# Patient Record
Sex: Male | Born: 1950 | Race: Black or African American | Hispanic: No | Smoking: Former smoker
Health system: Southern US, Community
[De-identification: ages and names within clinical notes are randomized; demographics above are authoritative.]

## PROBLEM LIST (undated history)

## (undated) DIAGNOSIS — I2510 Atherosclerotic heart disease of native coronary artery without angina pectoris: Secondary | ICD-10-CM

## (undated) DIAGNOSIS — F1020 Alcohol dependence, uncomplicated: Secondary | ICD-10-CM

## (undated) DIAGNOSIS — J45909 Unspecified asthma, uncomplicated: Secondary | ICD-10-CM

## (undated) DIAGNOSIS — J42 Unspecified chronic bronchitis: Secondary | ICD-10-CM

## (undated) DIAGNOSIS — J189 Pneumonia, unspecified organism: Secondary | ICD-10-CM

## (undated) DIAGNOSIS — K7460 Unspecified cirrhosis of liver: Secondary | ICD-10-CM

## (undated) DIAGNOSIS — I10 Essential (primary) hypertension: Secondary | ICD-10-CM

## (undated) DIAGNOSIS — I5032 Chronic diastolic (congestive) heart failure: Secondary | ICD-10-CM

## (undated) DIAGNOSIS — E7800 Pure hypercholesterolemia, unspecified: Secondary | ICD-10-CM

## (undated) DIAGNOSIS — B1920 Unspecified viral hepatitis C without hepatic coma: Secondary | ICD-10-CM

## (undated) DIAGNOSIS — IMO0001 Reserved for inherently not codable concepts without codable children: Secondary | ICD-10-CM

## (undated) DIAGNOSIS — E119 Type 2 diabetes mellitus without complications: Secondary | ICD-10-CM

## (undated) DIAGNOSIS — M1990 Unspecified osteoarthritis, unspecified site: Secondary | ICD-10-CM

## (undated) DIAGNOSIS — J449 Chronic obstructive pulmonary disease, unspecified: Secondary | ICD-10-CM

## (undated) HISTORY — DX: Unspecified cirrhosis of liver: K74.60

## (undated) HISTORY — DX: Alcohol dependence, uncomplicated: F10.20

---

## 1998-10-10 ENCOUNTER — Encounter: Admission: RE | Admit: 1998-10-10 | Discharge: 1999-01-08 | Payer: Self-pay | Admitting: Internal Medicine

## 2001-10-09 ENCOUNTER — Emergency Department (HOSPITAL_COMMUNITY): Admission: EM | Admit: 2001-10-09 | Discharge: 2001-10-09 | Payer: Self-pay | Admitting: Emergency Medicine

## 2002-01-23 ENCOUNTER — Ambulatory Visit (HOSPITAL_COMMUNITY): Admission: RE | Admit: 2002-01-23 | Discharge: 2002-01-23 | Payer: Self-pay | Admitting: *Deleted

## 2002-01-23 ENCOUNTER — Encounter: Payer: Self-pay | Admitting: *Deleted

## 2002-01-23 ENCOUNTER — Encounter (INDEPENDENT_AMBULATORY_CARE_PROVIDER_SITE_OTHER): Payer: Self-pay | Admitting: Specialist

## 2004-08-25 ENCOUNTER — Emergency Department (HOSPITAL_COMMUNITY): Admission: EM | Admit: 2004-08-25 | Discharge: 2004-08-25 | Payer: Self-pay | Admitting: *Deleted

## 2004-09-14 ENCOUNTER — Ambulatory Visit (HOSPITAL_COMMUNITY): Admission: RE | Admit: 2004-09-14 | Discharge: 2004-09-14 | Payer: Self-pay | Admitting: Gastroenterology

## 2004-09-26 ENCOUNTER — Ambulatory Visit (HOSPITAL_COMMUNITY): Admission: RE | Admit: 2004-09-26 | Discharge: 2004-09-26 | Payer: Self-pay | Admitting: Gastroenterology

## 2007-05-09 ENCOUNTER — Ambulatory Visit: Payer: Self-pay | Admitting: Gastroenterology

## 2007-10-31 ENCOUNTER — Ambulatory Visit (HOSPITAL_COMMUNITY): Admission: RE | Admit: 2007-10-31 | Discharge: 2007-10-31 | Payer: Self-pay | Admitting: Gastroenterology

## 2007-12-25 ENCOUNTER — Ambulatory Visit: Payer: Self-pay | Admitting: Gastroenterology

## 2009-01-13 ENCOUNTER — Observation Stay (HOSPITAL_COMMUNITY): Admission: EM | Admit: 2009-01-13 | Discharge: 2009-01-14 | Payer: Self-pay | Admitting: Emergency Medicine

## 2009-01-19 ENCOUNTER — Emergency Department (HOSPITAL_COMMUNITY): Admission: EM | Admit: 2009-01-19 | Discharge: 2009-01-19 | Payer: Self-pay | Admitting: Emergency Medicine

## 2009-07-07 ENCOUNTER — Ambulatory Visit: Payer: Self-pay | Admitting: Gastroenterology

## 2009-10-19 ENCOUNTER — Ambulatory Visit (HOSPITAL_COMMUNITY)
Admission: RE | Admit: 2009-10-19 | Discharge: 2009-10-19 | Payer: Self-pay | Admitting: Physical Medicine and Rehabilitation

## 2009-10-22 HISTORY — PX: TOTAL KNEE ARTHROPLASTY: SHX125

## 2010-03-26 ENCOUNTER — Encounter: Payer: Self-pay | Admitting: Gastroenterology

## 2010-05-09 ENCOUNTER — Ambulatory Visit
Admission: RE | Admit: 2010-05-09 | Discharge: 2010-05-09 | Disposition: A | Discharge status: Home / Self Care | Payer: BC Managed Care – PPO | Source: Ambulatory Visit | Attending: Internal Medicine | Admitting: Internal Medicine

## 2010-05-09 ENCOUNTER — Other Ambulatory Visit: Payer: Self-pay | Admitting: Internal Medicine

## 2010-05-09 DIAGNOSIS — R1012 Left upper quadrant pain: Secondary | ICD-10-CM

## 2010-05-09 MED ORDER — IOHEXOL 300 MG/ML  SOLN
100.0000 mL | Freq: Once | INTRAMUSCULAR | Status: AC | PRN
  Administered 2010-05-09: 100 mL via INTRAVENOUS

## 2010-05-15 ENCOUNTER — Other Ambulatory Visit: Payer: Self-pay | Admitting: Internal Medicine

## 2010-05-15 DIAGNOSIS — R1011 Right upper quadrant pain: Secondary | ICD-10-CM

## 2010-05-18 ENCOUNTER — Ambulatory Visit
Admission: RE | Admit: 2010-05-18 | Discharge: 2010-05-18 | Disposition: A | Discharge status: Home / Self Care | Payer: BC Managed Care – PPO | Source: Ambulatory Visit | Attending: Internal Medicine | Admitting: Internal Medicine

## 2010-05-18 DIAGNOSIS — R1011 Right upper quadrant pain: Secondary | ICD-10-CM

## 2010-05-19 LAB — CBC
MCHC: 33.7 g/dL (ref 30.0–36.0)
Platelets: 230 10*3/uL (ref 150–400)
RDW: 13.5 % (ref 11.5–15.5)

## 2010-05-19 LAB — GLUCOSE, CAPILLARY: Glucose-Capillary: 120 mg/dL — ABNORMAL HIGH (ref 70–99)

## 2010-05-19 LAB — PROTIME-INR
INR: 0.99 (ref 0.00–1.49)
Prothrombin Time: 13.3 seconds (ref 11.6–15.2)

## 2010-06-07 LAB — CBC
HCT: 39.9 % (ref 39.0–52.0)
Hemoglobin: 13.7 g/dL (ref 13.0–17.0)
MCHC: 34.3 g/dL (ref 30.0–36.0)
MCV: 88.8 fL (ref 78.0–100.0)
MCV: 89.5 fL (ref 78.0–100.0)
Platelets: 220 10*3/uL (ref 150–400)
RBC: 4.46 MIL/uL (ref 4.22–5.81)
RDW: 13.3 % (ref 11.5–15.5)
WBC: 5.3 10*3/uL (ref 4.0–10.5)

## 2010-06-07 LAB — URINE MICROSCOPIC-ADD ON

## 2010-06-07 LAB — POCT I-STAT, CHEM 8
BUN: 15 mg/dL (ref 6–23)
Creatinine, Ser: 0.8 mg/dL (ref 0.4–1.5)
Hemoglobin: 15.3 g/dL (ref 13.0–17.0)
Potassium: 3.9 mEq/L (ref 3.5–5.1)
Sodium: 138 mEq/L (ref 135–145)

## 2010-06-07 LAB — CARDIAC PANEL(CRET KIN+CKTOT+MB+TROPI)
Total CK: 154 U/L (ref 7–232)
Total CK: 196 U/L (ref 7–232)

## 2010-06-07 LAB — POCT CARDIAC MARKERS
CKMB, poc: 1.9 ng/mL (ref 1.0–8.0)
Myoglobin, poc: 103 ng/mL (ref 12–200)

## 2010-06-07 LAB — COMPREHENSIVE METABOLIC PANEL
AST: 61 U/L — ABNORMAL HIGH (ref 0–37)
AST: 57 U/L — ABNORMAL HIGH (ref 0–37)
Albumin: 3.6 g/dL (ref 3.5–5.2)
CO2: 25 mEq/L (ref 19–32)
Calcium: 9.4 mg/dL (ref 8.4–10.5)
Calcium: 8.5 mg/dL (ref 8.4–10.5)
Creatinine, Ser: 0.81 mg/dL (ref 0.4–1.5)
Creatinine, Ser: 0.75 mg/dL (ref 0.4–1.5)
GFR calc Af Amer: >60 mL/min (ref >60)
GFR calc Af Amer: >60 mL/min (ref >60)
GFR calc non Af Amer: >60 mL/min (ref >60)
GFR calc non Af Amer: >60 mL/min (ref >60)
Total Protein: 6.4 g/dL (ref 6.0–8.3)

## 2010-06-07 LAB — CK TOTAL AND CKMB (NOT AT ARMC)
CK, MB: 2.7 ng/mL (ref 0.3–4.0)
Total CK: 236 U/L — ABNORMAL HIGH (ref 7–232)

## 2010-06-07 LAB — DIFFERENTIAL
Eosinophils Absolute: 0.1 10*3/uL (ref 0.0–0.7)
Eosinophils Relative: 1 % (ref 0–5)
Eosinophils Relative: 2 % (ref 0–5)
Lymphocytes Relative: 31 % (ref 12–46)
Lymphs Abs: 1.6 10*3/uL (ref 0.7–4.0)
Lymphs Abs: 3.4 10*3/uL (ref 0.7–4.0)
Monocytes Relative: 10 % (ref 3–12)

## 2010-06-07 LAB — TSH: TSH: 1.708 u[IU]/mL (ref 0.350–4.500)

## 2010-06-07 LAB — URINALYSIS, ROUTINE W REFLEX MICROSCOPIC
Glucose, UA: NEGATIVE mg/dL
Ketones, ur: 15 mg/dL — AB
pH: 5.5 (ref 5.0–8.0)

## 2010-06-07 LAB — GLUCOSE, CAPILLARY
Glucose-Capillary: 148 mg/dL — ABNORMAL HIGH (ref 70–99)
Glucose-Capillary: 82 mg/dL (ref 70–99)
Glucose-Capillary: 87 mg/dL (ref 70–99)

## 2010-06-07 LAB — LIPID PANEL
LDL Cholesterol: 144 mg/dL — ABNORMAL HIGH (ref 0–99)
Total CHOL/HDL Ratio: 4.7 RATIO
VLDL: 24 mg/dL (ref 0–40)

## 2010-06-07 LAB — TROPONIN I: Troponin I: 0.02 ng/mL (ref 0.00–0.06)

## 2010-06-07 LAB — D-DIMER, QUANTITATIVE: D-Dimer, Quant: 0.24 ug/mL-FEU (ref 0.00–0.48)

## 2010-08-01 ENCOUNTER — Encounter: Payer: Self-pay | Admitting: Gastroenterology

## 2010-08-31 ENCOUNTER — Ambulatory Visit (INDEPENDENT_AMBULATORY_CARE_PROVIDER_SITE_OTHER): Payer: BC Managed Care – PPO | Admitting: Gastroenterology

## 2010-08-31 VITALS — BP 129/77 | HR 84 | Temp 97.7°F | Ht 64.0 in | Wt 167.0 lb

## 2010-08-31 DIAGNOSIS — B182 Chronic viral hepatitis C: Secondary | ICD-10-CM

## 2010-09-07 NOTE — Progress Notes (Signed)
NAME:  [name], [name]  MR#:  [id number]      DATE:  08/31/2010  DOB:  03/10/1950    cc: Consulting Physician:  Kyle Littler, MD, [facility], [address], [place], [place] [address], Fa x [phone] Kyle Listen, MD, [facility], [facility], [address] [place], [place] [address], Fax [phone] Referring Physician:  Trayon Bellow, MD, [facility], [address] [place], [place] [address], Fax [phone] Kyle Savoy, MD, [facility], [facility], [address] [place], [place], [place] [address], Fax [phone]    REASON FOR VISIT:  Follow up of genotype 1 hepatitis C.    History:  The patient returns today unaccompanied. He was previously seen by me on 07/07/2009, for his genotype 1a hepatitis C with a biopsy in 2003 showing grade 2 stage II-III disease. At that time, he was supposed to  undergo a liver biopsy, and then return in 3 months' time for follow up. He underwent a liver biopsy on 10/19/2009, but has been lost to followup since. This showed roughly speaking grade 2 stage II-III  fibrosis. He currently has no symptoms to suggest cryoglobulin mediated or decompensated liver disease. He currently has no symptoms directly referable to his hepatitis C nor other symptoms to suggest  decompensated liver disease. In terms of cryoglobulin mediated symptoms the patient reports that he has a left knee arthritis for which he may need to undergo surgery. He recalls that Dr. Corliss Owens,  who he had seen for this, told him that his arthritis/algia may be related to his hepatitis C, though I have not received any records from Dr. Corliss Owens regarding him to know on what basis this was concluded.  The patient reports he is unsure as to when there will be surgery because he is currently unable to see Dr. Althea Owens, his  orthopedic surgeon, because of an unpaid bill. Furthermore, he believed Dr. Corliss Owens that his hepatitis C was the priority over his knee.  It should be recalled that the patient has previously had history of anxiety disorder. He reports that he has not had any anxiety attacks  in over 6 months, and he has not had any ongoing counseling, follow up, though he is maintained on an SSRI.   CURRENT MEDICATIONS:  Metformin 500 mg p.o. b.i.d., glipizide 5 mg p.o. daily, Actos 30 mg p.o. daily, atorvastatin 10 mg p.o. daily, bisoprolol/hydrochlorothiazide 5/25 mg p.o. daily, cilostazol 100 mg  p.o. b.i.d., Vicodin 10 mg p.o. p.r.n. for pain, sertraline 150 mg p.o. daily, fish oil 100 mg p.o. t.i.d., aspirin 81 mg p.o. daily, vitamin B 100 B complex three times a week, Viagra 100 mg p.o. p.r.n..  He was previously on a tapered dose of prednisone for his left knee joint swelling and pain, which is now discontinued.   allergies:  Denies.   habits:  Smoking, denies.  Alcohol denies interval consumption.   REVIEW OF SYSTEMS:  All 10 systems reviewed today with the patient and is significant for left knee pain because of the arthralgias or because of arthritis. His CES-D was 23.   PHYSICAL EXAMINATION:  Constitutional:  Well-appearing without significant bitemporal wasting. Vital signs: Height 64 inches, weight 167 pounds, blood pressure 129/77, pulse of 84, temperature 97.7 Fahrenheit.   LABORATORY:  Labs from 05/09/2010; his AST was 56, ALT 71, ALP 59, total bilirubin 0.6, albumin 4.6, globulins 2.8.   On 05/10/2010; his AST was 60,  ALT 76, ALP 51, total bilirubin 0.7, which the direct was 0.2, albumin 4.6. Triglycerides were 91.  CT scan of the abdomen on 05/09/2010 with and without contrast showed an unremarkable liver and spleen. The pancreas did not have any focal lesions. There was stranding in the region of the pancreatic neck  suggesting some component of pancreatitis, although there was no  pseudocyst or abscess noticed.  On 05/12/2010; genotype was 1a and his viral load was 2,770,000 international units per mL.   Assessment:  The patient is a 60 year old gentleman with history of genotype 1a hepatitis C with a biopsy in 2003 showing grade 2 stage II-III disease and a more recent biopsy on 10/19/2009, showing effectively grade 2  stage II-III disease, as well. His synthetic function is intact.  In terms of whether his hepatitis C is causing his left knee arthritis as he says he was told, I have no records regarding this from the orthopedic surgeon or rheumatologist. I find it very  unusual for hepatitis C to cause unilateral large joint pathology. Furthermore, surgery would not be the treatment for cryoglobulin mediated joint disease. This leads me to conclude that the hepatitis C is not responsible for his left knee arthritis/algia. If he is to undergo surgery for this, this should take priority over treatment of hepatitis C. Treatment for hepatitis C would be up to a 12 month course complicated by significant anemia and possibly neutropenia and thrombocytopenia.  Treatment interruption due to surgery would be inadvisable because of the risk of developing relapse of virus and resistance to some of the medications. Therefore, if he is undergoing knee surgery, this should be done first. From his lab testing so far, there would be no contraindication to doing his knee surgery from the  standpoint of his liver disease. Of course, not having any records from his orthopedic surgeon or rheumatologist, there may be other plans other than surgery.  In terms of his genotype 1a hepatitis C, he would be a good candidate for treatment. Even his anxiety disorder is not reported as a significant problem recently. I think he could tolerate therapy.  In my discussion today with the patient, we discussed his biopsy findings from last year.  We discussed possibly treating him with pegylated interferon and  ribavirin, and a protease inhibitor. I  reviewed the specific systems, constitutional, psychiatric side effects of therapy, as well as our treatment protocol and our response rates. I have explained to him that he needs to work out what is going  to happen with his knee first before proceeding with any treatment for his hepatitis C.   plan:  1. Will order standard labs today. 2. Will check and IL 28 B. 3. Check cryoglobulins. 4. By way of this note I would ask for any records regarding the patient to be sent from Drs. Deveshwar and [name] to fax number [phone]. 5. Hepatitis A and B vaccine completed. 6. He is to contact us to let us know how the situation with the knee is progressing, so that we can plan for follow up appointment as appropriate. 7. As a default I will ask the office to book him in for 6 month's time in follow up.            Brooke Dare, MD   ADDENDUM:  Cryoglobulins negative, so even more doubtful that his joint symptoms are related to his Hepatitis C.  IL28B CT.  [phone]  D:  Thu Jun 28 18:20:42 2012 ;  T:  Thu Jun 28 22:44:26 2012  Job #:  [id number]

## 2010-10-13 ENCOUNTER — Ambulatory Visit (HOSPITAL_COMMUNITY)
Admission: RE | Admit: 2010-10-13 | Discharge: 2010-10-13 | Disposition: A | Discharge status: Home / Self Care | Payer: BC Managed Care – PPO | Source: Ambulatory Visit | Attending: Orthopedic Surgery | Admitting: Orthopedic Surgery

## 2010-10-13 ENCOUNTER — Other Ambulatory Visit (HOSPITAL_COMMUNITY): Payer: Self-pay | Admitting: Orthopedic Surgery

## 2010-10-13 ENCOUNTER — Encounter (HOSPITAL_COMMUNITY)
Admission: RE | Admit: 2010-10-13 | Discharge: 2010-10-13 | Disposition: A | Discharge status: Home / Self Care | Payer: BC Managed Care – PPO | Source: Ambulatory Visit | Attending: Orthopedic Surgery | Admitting: Orthopedic Surgery

## 2010-10-13 DIAGNOSIS — Z01818 Encounter for other preprocedural examination: Secondary | ICD-10-CM | POA: Insufficient documentation

## 2010-10-13 DIAGNOSIS — Z01812 Encounter for preprocedural laboratory examination: Secondary | ICD-10-CM | POA: Insufficient documentation

## 2010-10-13 DIAGNOSIS — Z01810 Encounter for preprocedural cardiovascular examination: Secondary | ICD-10-CM | POA: Insufficient documentation

## 2010-10-13 DIAGNOSIS — R52 Pain, unspecified: Secondary | ICD-10-CM

## 2010-10-13 LAB — URINALYSIS, ROUTINE W REFLEX MICROSCOPIC
Leukocytes, UA: NEGATIVE
Protein, ur: NEGATIVE mg/dL
Urobilinogen, UA: 1 mg/dL (ref 0.0–1.0)

## 2010-10-13 LAB — BASIC METABOLIC PANEL
Calcium: 9.7 mg/dL (ref 8.4–10.5)
GFR calc non Af Amer: >60 mL/min (ref >60)
Sodium: 139 mEq/L (ref 135–145)

## 2010-10-13 LAB — DIFFERENTIAL
Eosinophils Relative: 2 % (ref 0–5)
Lymphocytes Relative: 56 % — ABNORMAL HIGH (ref 12–46)
Lymphs Abs: 3.5 10*3/uL (ref 0.7–4.0)
Neutrophils Relative %: 32 % — ABNORMAL LOW (ref 43–77)

## 2010-10-13 LAB — APTT: aPTT: 31 seconds (ref 24–37)

## 2010-10-13 LAB — CBC
HCT: 42.3 % (ref 39.0–52.0)
MCV: 86.5 fL (ref 78.0–100.0)
RBC: 4.89 MIL/uL (ref 4.22–5.81)
WBC: 6.2 10*3/uL (ref 4.0–10.5)

## 2010-10-13 LAB — SURGICAL PCR SCREEN: MRSA, PCR: NEGATIVE

## 2010-10-13 LAB — TYPE AND SCREEN: ABO/RH(D): A POS

## 2010-10-23 ENCOUNTER — Inpatient Hospital Stay (HOSPITAL_COMMUNITY)
Admission: RE | Admit: 2010-10-23 | Discharge: 2010-10-26 | DRG: 209 | Disposition: A | Discharge status: Skilled Nursing Facility | Payer: BC Managed Care – PPO | Source: Ambulatory Visit | Attending: Orthopedic Surgery | Admitting: Orthopedic Surgery

## 2010-10-23 DIAGNOSIS — Z23 Encounter for immunization: Secondary | ICD-10-CM

## 2010-10-23 DIAGNOSIS — M1710 Unilateral primary osteoarthritis, unspecified knee: Principal | ICD-10-CM | POA: Diagnosis present

## 2010-10-23 DIAGNOSIS — B1920 Unspecified viral hepatitis C without hepatic coma: Secondary | ICD-10-CM | POA: Diagnosis present

## 2010-10-23 DIAGNOSIS — Z79899 Other long term (current) drug therapy: Secondary | ICD-10-CM

## 2010-10-23 DIAGNOSIS — I10 Essential (primary) hypertension: Secondary | ICD-10-CM | POA: Diagnosis present

## 2010-10-23 DIAGNOSIS — F17200 Nicotine dependence, unspecified, uncomplicated: Secondary | ICD-10-CM | POA: Diagnosis present

## 2010-10-23 DIAGNOSIS — H9190 Unspecified hearing loss, unspecified ear: Secondary | ICD-10-CM | POA: Diagnosis present

## 2010-10-23 DIAGNOSIS — E119 Type 2 diabetes mellitus without complications: Secondary | ICD-10-CM | POA: Diagnosis present

## 2010-10-23 LAB — GLUCOSE, CAPILLARY: Glucose-Capillary: 252 mg/dL — ABNORMAL HIGH (ref 70–99)

## 2010-10-24 LAB — BASIC METABOLIC PANEL
BUN: 10 mg/dL (ref 6–23)
CO2: 30 mEq/L (ref 19–32)
Calcium: 8.5 mg/dL (ref 8.4–10.5)
GFR calc non Af Amer: >60 mL/min (ref >60)
Glucose, Bld: 227 mg/dL — ABNORMAL HIGH (ref 70–99)
Potassium: 3.8 mEq/L (ref 3.5–5.1)

## 2010-10-24 LAB — CBC
MCH: 29.1 pg (ref 26.0–34.0)
MCV: 86.5 fL (ref 78.0–100.0)
Platelets: 169 10*3/uL (ref 150–400)
RDW: 14 % (ref 11.5–15.5)

## 2010-10-24 LAB — GLUCOSE, CAPILLARY

## 2010-10-25 LAB — CBC
MCV: 86.2 fL (ref 78.0–100.0)
Platelets: 151 10*3/uL (ref 150–400)
RBC: 3.55 MIL/uL — ABNORMAL LOW (ref 4.22–5.81)
WBC: 9.7 10*3/uL (ref 4.0–10.5)

## 2010-10-25 LAB — GLUCOSE, CAPILLARY
Glucose-Capillary: 165 mg/dL — ABNORMAL HIGH (ref 70–99)
Glucose-Capillary: 244 mg/dL — ABNORMAL HIGH (ref 70–99)

## 2010-10-25 LAB — PROTIME-INR: INR: 1.12 (ref 0.00–1.49)

## 2010-10-26 LAB — GLUCOSE, CAPILLARY
Glucose-Capillary: 113 mg/dL — ABNORMAL HIGH (ref 70–99)
Glucose-Capillary: 186 mg/dL — ABNORMAL HIGH (ref 70–99)

## 2010-10-26 LAB — CBC
Hemoglobin: 9.9 g/dL — ABNORMAL LOW (ref 13.0–17.0)
MCH: 29.3 pg (ref 26.0–34.0)
Platelets: 143 10*3/uL — ABNORMAL LOW (ref 150–400)
RBC: 3.38 MIL/uL — ABNORMAL LOW (ref 4.22–5.81)
WBC: 8.2 10*3/uL (ref 4.0–10.5)

## 2010-10-26 LAB — PROTIME-INR
INR: 1.55 — ABNORMAL HIGH (ref 0.00–1.49)
Prothrombin Time: 18.9 seconds — ABNORMAL HIGH (ref 11.6–15.2)

## 2010-10-30 LAB — GLUCOSE, CAPILLARY: Glucose-Capillary: 170 mg/dL — ABNORMAL HIGH (ref 70–99)

## 2010-10-30 NOTE — Op Note (Signed)
[name], [name] NO.:  [id number]  MEDICAL RECORD NO.:  [id number]  LOCATION:  5031                         FACILITY:  [facility]  PHYSICIAN:  Feliberto Gottron. Turner Daniels, M.D.   DATE OF BIRTH:  10/04/1950  DATE OF PROCEDURE:  10/23/2010 DATE OF DISCHARGE:                              OPERATIVE REPORT   PREOPERATIVE DIAGNOSIS:  End-stage arthritis of the left knee, medial compartment down to bare bone.  POSTOPERATIVE DIAGNOSIS:  End-stage arthritis of the left knee, medial compartment down to bare bone.  PROCEDURE:  Left total knee arthroplasty using DePuy Sigma RP components, 2.5 left femoral component, 3 tibial component, 10-mm Sigma RP bearing and a 38-mm patellar button.  All components cemented double batch of DePuy HV cement with 1500 mg of Zinacef.  SURGEON:  Feliberto Gottron. Turner Daniels, MD  FIRST ASSISTANT:  Shirl Harris PA-C  ANESTHETIC:  General endotracheal.  ESTIMATED BLOOD LOSS:  Minimal.  FLUID REPLACEMENT:  1500 mL of crystalloid.  DRAINS PLACED:  None.  TOURNIQUET TIME:  1 hour and 30 minutes.  INDICATIONS FOR PROCEDURE:  A 60 year old man with end-stage arthritis of the left knee.  X-ray showed near bone-on-bone arthritis, but is felt to be down to bare bone and this was documented I believe with arthroscopy a few months ago.  In any event, he has severe unremitting pain, bone-on-bone arthritis.  He has failed conservative measures, anti- inflammatory medicines, physical therapy, cortisone injections, and arthroscopy, now desires elective left total knee arthroplasty.  Risks and benefits of surgery were discussed, questions answered.  DESCRIPTION OF PROCEDURE:  The patient was identified by armband, received preoperative left femoral nerve block in the holding area at [facility], also received preoperative IV antibiotics, vancomycin, taken to operating room #4, appropriate anesthetic monitors were attached and general endotracheal anesthesia  induced with the patient in supine position.  Foley catheter was inserted, lateral post and foot positioner applied to the table.  Tourniquet applied high to the left thigh and the left lower extremity was prepped and draped in usual sterile fashion from the ankle to the tourniquet.  Time-out procedure was performed.  Limb was then wrapped with an Esmarch bandage, tourniquet inflated to 350 mmHg with a knee bent and anterior midline incision was made through the skin and subcutaneous tissue starting a handbreadth above the patella going over the patella and then 1 cm medial to and 3 cm distal to the tibial tubercle.  Small bleeders were identified and cauterized.  Transverse retinaculum was identified and reflected medially allowing a medial parapatellar arthrotomy.  At this point, the patella was everted and the prepatellar fat pad was resected. Superficial medial collateral ligament was elevated from anterior to posterior off the proximal flare of the tibia exposing the proximal tibia.  The anterior one half of the medial meniscus was resected.  With the patella everted, the knee was hyperflexed allowing removal of the anterior one half of the lateral meniscus as well.  We did identify the bare bone arthritic changes to the medial tibial plateau and medial femoral condyle as previously documented.  At this point, the ACL and PCL resected with electrocautery.  Posterior structures were  protected with a posteromedial Z-retractor, McCullough retractor to the notch and lateral Personal assistant.  We entered the proximal tibia in line with the axis of the tibial shaft with the DePuy step drill followed by the IM rod and 2 degrees posterior slope tibial cutting guide was then inserted.  We set the cut to remove about 6 mm of bone medially and 7-8 mm of bone laterally and the cutting guide pinned in place and the proximal tibial cut accomplished without difficulty.  We then entered the distal  femur 2 mm anterior to the PCL origin followed by the intramedullary rod and a 5 degrees left distal femoral cutting guide set at 11 mm, pinned along the epicondylar axis, and the distal femoral cuts were accomplished.  Using the posterior referencing sizing guide, we then sized for a 2.5 femoral component and placed the pins in 3 degrees of external rotation.  The 2.5 femoral cutting guide was then placed over the pins, screwed into place.  The anterior, posterior and chamfer cuts, and also had difficulty followed by the Sigma RP box cutting guide and box cut.  At this point, the knee was brought to full extension and were re-documented good extension gap for a 10-mm space, removed the posterior half of the menisci and checked our flexion gap as well.  The patella was measured at 25 mm, felt to fit a 38 button cutting guide set at 15 mm and the posterior 10 mm of the patella resected, size for a 38 button and drilled.  At this point, the knee was once again hyperflexed with the patella everted.  We sized for a three tibial baseplate, pinned in place followed by the smokestack conical reamer and Delta fin keel punch.  We then hammered into place a 2.5 left femoral component and drilled the lugs, inserted the 10-mm trial bearing and a trial patella, reduced the knee and took a full range of motion from 0 to 140 degrees with excellent stability.  At this point, all trial components were removed, the wound was irrigated out with normal saline solution pulse lavage and all bony surfaces were then dried with suction and sponges. At the back table, a double batch of DePuy HV cement with 1500 mg of Zinacef was mixed and applied to all bony metallic mating surfaces and in order, we hammered into place a three tibial baseplate and removed the excess cement, a 2.5 left femoral component and removed the excess cement.  A 10-mm Sigma RP bearing was then inserted, the knee reduced and held in full  extension as the cement had cured.  We also clamped into place a 38-mm patellar button and removed the excess cement and again held the clamp until the cement cured.  Using the anterolateral approach, we then inserted medium Hemovac drains and the wound was irrigated out with normal saline solution, pulse lavage one more time. After the cement had cured and excess cement was removed, we checked our tracking and stability one more time.  There was no thumb pressure needed for the patella.  The parapatellar arthrotomy was then closed with running #1 Vicryl suture.  The subcutaneous tissue with 0 and 2-0 undyed Vicryl suture and the skin with skin staples.  A dressing of Xeroform, 4x4 dressing sponges, Webril and Ace wrap applied.  Tourniquet let down.  The patient was awakened, extubated and was taken to the recovery room without difficulty.     Feliberto Gottron. Turner Daniels, M.D.     Ovid Curd  D:  10/23/2010  T:  10/23/2010  Job:  [id number]  Electronically Signed by Gean Birchwood M.D. on 10/30/2010 01:10:52 PM

## 2010-10-30 NOTE — Discharge Summary (Signed)
  NAMEZAVIEN, CLUBB NO.:  [id number]  MEDICAL RECORD NO.:  [id number]  LOCATION:  5031                         FACILITY:  [facility]  PHYSICIAN:  Feliberto Gottron. Turner Daniels, M.D.   DATE OF BIRTH:  1950/05/04  DATE OF ADMISSION:  10/23/2010 DATE OF DISCHARGE:  10/26/2010                              DISCHARGE SUMMARY   CHIEF COMPLAINT:  Left knee pain.  HISTORY OF PRESENT ILLNESS:  This is a 60 year old gentleman who complains of severe unremitting pain in his left knee despite extensive conservative treatment.  He now desires a surgical intervention.  All risks and benefits of surgery were discussed with the patient.  PAST MEDICAL HISTORY:  Significant for hepatitis C, hypertension, and diabetes.  He has never had surgery.  SOCIAL HISTORY:  He lives alone and smokes cigarettes.  ALLERGIES:  He has no known drug allergies.  FAMILY HISTORY:  Positive for diabetes.  PHYSICAL EXAM:  Gross examination a left knee demonstrates a varus deformity.  Range of motion is 10-95 degrees.  He is neurovascularly intact.  X-rays of the left knee demonstrate bone-on-bone degenerative joint disease in the medial compartment.  PREOP LABS:  Sodium 139, potassium 4.6, chloride 102, glucose 190, BUN 21, creatinine 0.82.  Urinalysis was within normal limits.  HOSPITAL COURSE:  Mr. Ewbank was admitted to [facility] on October 23, 2010 when he underwent left total knee arthroplasty.  The procedure was performed by Dr. Gean Birchwood and the patient tolerated it well.  Two Hemovac drains were placed into the left knee.  A perioperative Foley catheter was also placed.  He was transferred to the floor on Lovenox and Coumadin for DVT prophylaxis.  On the first postoperative day, he was awake and alert and reporting good pain control.  He denied any nausea or vomiting.  His Foley catheter was removed after physical therapy.  Hemoglobin was 11.3.  On postoperative day #2, he was progressing with  physical therapy and reported 5-10 pain in his left knee.  His dressing was changed and the incision appeared to be benign. On postoperative day #3, he was doing well and was discharged to a skilled nursing facility for a short stay of rehab.  DISPOSITION:  The patient was discharged to skilled nursing on October 26, 2010.  He was weightbearing as tolerated and would return to the clinic to see Dr. Turner Daniels in 10 days for x-rays and staple removal. Discharge medicines were as per the HMR with the addition of Percocet and Coumadin.  His target INR was 1.5-2.0.  Skilled nursing would manage his wound, Coumadin, and physical therapy.  FINAL DIAGNOSIS:  End-stage degenerative joint disease of the left knee.     Shirl Harris, PA   ______________________________ Feliberto Gottron. Turner Daniels, M.D.    JW/MEDQ  D:  10/25/2010  T:  10/25/2010  Job:  [id number]  Electronically Signed by Shirl Harris PA on 10/26/2010 03:48:09 PM Electronically Signed by Gean Birchwood M.D. on 10/30/2010 01:10:53 PM

## 2011-02-09 ENCOUNTER — Emergency Department (HOSPITAL_COMMUNITY): Payer: BC Managed Care – PPO

## 2011-02-09 ENCOUNTER — Inpatient Hospital Stay (HOSPITAL_COMMUNITY)
Admission: EM | Admit: 2011-02-09 | Discharge: 2011-02-11 | DRG: 552 | Disposition: A | Discharge status: Home / Self Care | Payer: BC Managed Care – PPO | Attending: Family Medicine | Admitting: Family Medicine

## 2011-02-09 ENCOUNTER — Other Ambulatory Visit: Payer: Self-pay

## 2011-02-09 ENCOUNTER — Inpatient Hospital Stay (HOSPITAL_COMMUNITY): Payer: BC Managed Care – PPO

## 2011-02-09 ENCOUNTER — Encounter (HOSPITAL_COMMUNITY)
Admission: EM | Disposition: A | Discharge status: Home / Self Care | Payer: Self-pay | Source: Home / Self Care | Attending: Family Medicine

## 2011-02-09 DIAGNOSIS — D649 Anemia, unspecified: Secondary | ICD-10-CM

## 2011-02-09 DIAGNOSIS — D638 Anemia in other chronic diseases classified elsewhere: Secondary | ICD-10-CM

## 2011-02-09 DIAGNOSIS — F1010 Alcohol abuse, uncomplicated: Secondary | ICD-10-CM

## 2011-02-09 DIAGNOSIS — I10 Essential (primary) hypertension: Secondary | ICD-10-CM | POA: Diagnosis present

## 2011-02-09 DIAGNOSIS — I8501 Esophageal varices with bleeding: Secondary | ICD-10-CM

## 2011-02-09 DIAGNOSIS — N289 Disorder of kidney and ureter, unspecified: Secondary | ICD-10-CM

## 2011-02-09 DIAGNOSIS — R197 Diarrhea, unspecified: Secondary | ICD-10-CM | POA: Diagnosis present

## 2011-02-09 DIAGNOSIS — B1920 Unspecified viral hepatitis C without hepatic coma: Secondary | ICD-10-CM | POA: Diagnosis present

## 2011-02-09 DIAGNOSIS — K226 Gastro-esophageal laceration-hemorrhage syndrome: Principal | ICD-10-CM

## 2011-02-09 DIAGNOSIS — E119 Type 2 diabetes mellitus without complications: Secondary | ICD-10-CM | POA: Diagnosis present

## 2011-02-09 DIAGNOSIS — K922 Gastrointestinal hemorrhage, unspecified: Secondary | ICD-10-CM

## 2011-02-09 DIAGNOSIS — E7800 Pure hypercholesterolemia, unspecified: Secondary | ICD-10-CM | POA: Diagnosis present

## 2011-02-09 DIAGNOSIS — D62 Acute posthemorrhagic anemia: Secondary | ICD-10-CM | POA: Diagnosis present

## 2011-02-09 DIAGNOSIS — I2510 Atherosclerotic heart disease of native coronary artery without angina pectoris: Secondary | ICD-10-CM | POA: Diagnosis present

## 2011-02-09 DIAGNOSIS — F17200 Nicotine dependence, unspecified, uncomplicated: Secondary | ICD-10-CM | POA: Diagnosis present

## 2011-02-09 DIAGNOSIS — N179 Acute kidney failure, unspecified: Secondary | ICD-10-CM | POA: Diagnosis present

## 2011-02-09 HISTORY — DX: Essential (primary) hypertension: I10

## 2011-02-09 HISTORY — DX: Unspecified viral hepatitis C without hepatic coma: B19.20

## 2011-02-09 HISTORY — PX: ESOPHAGOGASTRODUODENOSCOPY: SHX5428

## 2011-02-09 HISTORY — DX: Atherosclerotic heart disease of native coronary artery without angina pectoris: I25.10

## 2011-02-09 HISTORY — DX: Pure hypercholesterolemia, unspecified: E78.00

## 2011-02-09 LAB — COMPREHENSIVE METABOLIC PANEL WITH GFR
ALT: 38 U/L (ref 0–53)
AST: 30 U/L (ref 0–37)
Albumin: 2.7 g/dL — ABNORMAL LOW (ref 3.5–5.2)
Alkaline Phosphatase: 51 U/L (ref 39–117)
BUN: 55 mg/dL — ABNORMAL HIGH (ref 6–23)
CO2: 21 meq/L (ref 19–32)
Calcium: 8 mg/dL — ABNORMAL LOW (ref 8.4–10.5)
Chloride: 102 meq/L (ref 96–112)
Creatinine, Ser: 1.96 mg/dL — ABNORMAL HIGH (ref 0.50–1.35)
GFR calc Af Amer: 41 mL/min — ABNORMAL LOW
GFR calc non Af Amer: 36 mL/min — ABNORMAL LOW
Glucose, Bld: 353 mg/dL — ABNORMAL HIGH (ref 70–99)
Potassium: 5 meq/L (ref 3.5–5.1)
Sodium: 136 meq/L (ref 135–145)
Total Bilirubin: 0.3 mg/dL (ref 0.3–1.2)
Total Protein: 5.5 g/dL — ABNORMAL LOW (ref 6.0–8.3)

## 2011-02-09 LAB — CBC
HCT: 27.7 % — ABNORMAL LOW (ref 39.0–52.0)
HCT: 25.8 % — ABNORMAL LOW (ref 39.0–52.0)
Hemoglobin: 9.4 g/dL — ABNORMAL LOW (ref 13.0–17.0)
Hemoglobin: 9 g/dL — ABNORMAL LOW (ref 13.0–17.0)
MCH: 28.7 pg (ref 26.0–34.0)
MCH: 29.4 pg (ref 26.0–34.0)
MCHC: 33.9 g/dL (ref 30.0–36.0)
MCHC: 34.9 g/dL (ref 30.0–36.0)
MCV: 84.3 fL (ref 78.0–100.0)
RBC: 3.06 MIL/uL — ABNORMAL LOW (ref 4.22–5.81)
RDW: 13.5 % (ref 11.5–15.5)

## 2011-02-09 LAB — PROTIME-INR
INR: 1.19 (ref 0.00–1.49)
Prothrombin Time: 15.4 s — ABNORMAL HIGH (ref 11.6–15.2)

## 2011-02-09 LAB — DIFFERENTIAL
Basophils Absolute: 0 K/uL (ref 0.0–0.1)
Basophils Relative: 0 % (ref 0–1)
Eosinophils Absolute: 0 K/uL (ref 0.0–0.7)
Eosinophils Relative: 0 % (ref 0–5)
Lymphocytes Relative: 33 % (ref 12–46)
Lymphs Abs: 2.9 K/uL (ref 0.7–4.0)
Monocytes Absolute: 0.5 K/uL (ref 0.1–1.0)
Monocytes Relative: 6 % (ref 3–12)
Neutro Abs: 5.3 K/uL (ref 1.7–7.7)
Neutrophils Relative %: 61 % (ref 43–77)

## 2011-02-09 LAB — ETHANOL: Alcohol, Ethyl (B): <11 mg/dL (ref 0–11)

## 2011-02-09 LAB — HEMOGLOBIN A1C: Hgb A1c MFr Bld: 7.3 % — ABNORMAL HIGH (ref <5.7)

## 2011-02-09 LAB — GLUCOSE, CAPILLARY
Glucose-Capillary: 263 mg/dL — ABNORMAL HIGH (ref 70–99)
Glucose-Capillary: 179 mg/dL — ABNORMAL HIGH (ref 70–99)

## 2011-02-09 LAB — RAPID URINE DRUG SCREEN, HOSP PERFORMED
Barbiturates: NOT DETECTED
Tetrahydrocannabinol: POSITIVE — AB

## 2011-02-09 LAB — AMMONIA: Ammonia: <10 umol/L — ABNORMAL LOW (ref 11–60)

## 2011-02-09 LAB — LACTIC ACID, PLASMA: Lactic Acid, Venous: 4 mmol/L — ABNORMAL HIGH (ref 0.5–2.2)

## 2011-02-09 LAB — APTT: aPTT: 33 seconds (ref 24–37)

## 2011-02-09 LAB — LIPASE, BLOOD: Lipase: 64 U/L — ABNORMAL HIGH (ref 11–59)

## 2011-02-09 SURGERY — EGD (ESOPHAGOGASTRODUODENOSCOPY)
Anesthesia: Moderate Sedation

## 2011-02-09 MED ORDER — MORPHINE SULFATE 2 MG/ML IJ SOLN: 2.0000 mg | INTRAMUSCULAR | Status: DC | PRN

## 2011-02-09 MED ORDER — INFLUENZA VIRUS VACC SPLIT PF IM SUSP
0.5000 mL | INTRAMUSCULAR | Status: AC
  Administered 2011-02-10: 0.5 mL via INTRAMUSCULAR
  Filled 2011-02-09: qty 0.5

## 2011-02-09 MED ORDER — SODIUM CHLORIDE 0.9 % IV SOLN
999.0000 mL | Freq: Once | INTRAVENOUS | Status: AC
  Administered 2011-02-09: 999 mL via INTRAVENOUS

## 2011-02-09 MED ORDER — SODIUM CHLORIDE 0.9 % IV BOLUS (SEPSIS)
1000.0000 mL | Freq: Once | INTRAVENOUS | Status: AC
  Administered 2011-02-09: 1000 mL via INTRAVENOUS

## 2011-02-09 MED ORDER — INSULIN ASPART 100 UNIT/ML ~~LOC~~ SOLN
0.0000 [IU] | Freq: Three times a day (TID) | SUBCUTANEOUS | Status: DC
  Administered 2011-02-09: 2 [IU] via SUBCUTANEOUS
  Administered 2011-02-10: 3 [IU] via SUBCUTANEOUS
  Administered 2011-02-10 – 2011-02-11 (×2): 1 [IU] via SUBCUTANEOUS
  Administered 2011-02-11: 5 [IU] via SUBCUTANEOUS
  Filled 2011-02-09: qty 3

## 2011-02-09 MED ORDER — FENTANYL CITRATE 0.05 MG/ML IJ SOLN
INTRAMUSCULAR | Status: AC
  Filled 2011-02-09: qty 2

## 2011-02-09 MED ORDER — BUTAMBEN-TETRACAINE-BENZOCAINE 2-2-14 % EX AERO
INHALATION_SPRAY | CUTANEOUS | Status: DC | PRN
  Administered 2011-02-09: 2 via TOPICAL

## 2011-02-09 MED ORDER — FENTANYL NICU IV SYRINGE 50 MCG/ML
INJECTION | INTRAMUSCULAR | Status: DC | PRN
  Administered 2011-02-09 (×3): 25 ug via INTRAVENOUS

## 2011-02-09 MED ORDER — MIDAZOLAM HCL 10 MG/2ML IJ SOLN
INTRAMUSCULAR | Status: AC
  Filled 2011-02-09: qty 2

## 2011-02-09 MED ORDER — SODIUM CHLORIDE 0.9 % IV SOLN
INTRAVENOUS | Status: DC
  Administered 2011-02-09 – 2011-02-11 (×6): via INTRAVENOUS

## 2011-02-09 MED ORDER — ONDANSETRON HCL 4 MG/2ML IJ SOLN: 4.0000 mg | Freq: Four times a day (QID) | INTRAMUSCULAR | Status: DC | PRN

## 2011-02-09 MED ORDER — MIDAZOLAM HCL 10 MG/2ML IJ SOLN
INTRAMUSCULAR | Status: DC | PRN
  Administered 2011-02-09: 2 mg via INTRAVENOUS
  Administered 2011-02-09: 1 mg via INTRAVENOUS
  Administered 2011-02-09: 2 mg via INTRAVENOUS
  Administered 2011-02-09: 1 mg via INTRAVENOUS

## 2011-02-09 MED ORDER — ONDANSETRON HCL 4 MG/2ML IJ SOLN
INTRAMUSCULAR | Status: AC
  Administered 2011-02-09: 4 mg
  Filled 2011-02-09: qty 2

## 2011-02-09 MED ORDER — INSULIN ASPART 100 UNIT/ML ~~LOC~~ SOLN
0.0000 [IU] | Freq: Every day | SUBCUTANEOUS | Status: DC
  Filled 2011-02-09: qty 3

## 2011-02-09 MED ORDER — SODIUM CHLORIDE 0.9 % IV SOLN
80.0000 mg | Freq: Once | INTRAVENOUS | Status: AC
  Administered 2011-02-09: 80 mg via INTRAVENOUS
  Filled 2011-02-09: qty 80

## 2011-02-09 MED ORDER — PANTOPRAZOLE SODIUM 40 MG IV SOLR
40.0000 mg | Freq: Two times a day (BID) | INTRAVENOUS | Status: DC
  Administered 2011-02-09 – 2011-02-10 (×2): 40 mg via INTRAVENOUS
  Filled 2011-02-09 (×3): qty 40

## 2011-02-09 NOTE — Op Note (Signed)
[name], [name], [name]  MR#:  [id number] BIRTHDATE:  1950-11-12, 60 yrs. old  GENDER:  male  ENDOSCOPIST:  Dorena Cookey Referred by:  PROCEDURE DATE:  02/09/2011 PROCEDURE: ASA CLASS: INDICATIONS:  MEDICATIONS: Fentanyl 75 mcg, [name], [name] , [name], transfuse as necessary  REPEAT EXAM:  ______________________________ Dorena Cookey  CC:  n. eSIGNED:   Dorena Cookey at 02/09/2011 03:16 PM  Lurena Joiner, [id number]

## 2011-02-09 NOTE — ED Notes (Signed)
Pt on stretcher, blood unit #1 complete.

## 2011-02-09 NOTE — ED Notes (Signed)
Family at bedside.  Brother 

## 2011-02-09 NOTE — H&P (Signed)
[name] is an 60 y.o. male.   Chief Complaint: Upper GI bleed  HPI: Pt presents today with acute on chronic GI bleed x 8 weeks.  Pt states that this has been a on going issue for several years per pt. Pt states that he has had uncontrolled coffee ground emesis since yesterday evening. Pt is currently on asa 81. Pt denies any ETOH intake. Most recent episode of drinking was >12 years ago. Pt also has a baseline hx/o Hep C. Source of Hep C was IV drug use in the 80s per pt. Pt states that he has been drug free for greater than 1 decade. Pt states that GI bleeding is associated with food intake. Is independent of what type of food.  Pt has also had some persistent lower abdomial pain that has been associated with coffee ground emesis. Pain is in lower abdomen with out radiation. Bowel movements have been WNL. No diarrhea, no strenuous BMs. Pt is also noted to not be on PPI.   Past Medical History  Diagnosis Date  . Diabetes mellitus   . Hypertension   . Coronary artery disease   . Hepatitis C   . High cholesterol     Past Surgical History  Procedure Date  . Joint replacement     History reviewed. No pertinent family history. Social History:  reports that he has been smoking Cigars.  He does not have any smokeless tobacco history on file. He reports that he does not use illicit drugs. His alcohol history not on file.  Allergies: No Known Allergies  Medications Prior to Admission  Medication Dose Route Frequency Provider Last Rate Last Dose  . 0.9 %  sodium chloride infusion  999 mL Intravenous Once Hilario Quarry, MD 1,000 mL/hr at 02/09/11 0840 999 mL at 02/09/11 0840  . ondansetron (ZOFRAN) 4 MG/2ML injection        4 mg at 02/09/11 0841  . pantoprazole (PROTONIX) 80 mg in sodium chloride 0.9 % 100 mL IVPB  80 mg Intravenous Once Hilario Quarry, MD   80 mg at 02/09/11 0942  . sodium chloride 0.9 % bolus 1,000 mL  1,000 mL Intravenous Once Hilario Quarry, MD   1,000 mL at 02/09/11  1009   No current outpatient prescriptions on file as of 02/09/2011.    Results for orders placed during the hospital encounter of 02/09/11 (from the past 48 hour(s))  LACTIC ACID, PLASMA     Status: Abnormal   Collection Time   02/09/11  8:37 AM      Component Value Range Comment   Lactic Acid, Venous 4.0 (*) 0.5 - 2.2 (mmol/L)   TYPE AND SCREEN     Status: Normal (Preliminary result)   Collection Time   02/09/11  8:40 AM      Component Value Range Comment   ABO/RH(D) A POS      Antibody Screen NEG      Sample Expiration 02/12/2011      Unit Number [id number]      Blood Component Type RED CELLS,LR      Unit division 00      Status of Unit ALLOCATED      Transfusion Status OK TO TRANSFUSE      Crossmatch Result Compatible      Unit Number [id number]      Blood Component Type RED CELLS,LR      Unit division 00      Status of Unit ALLOCATED  Transfusion Status OK TO TRANSFUSE      Crossmatch Result Compatible      Unit Number [id number]      Blood Component Type RED CELLS,LR      Unit division 00      Status of Unit ISSUED      Transfusion Status OK TO TRANSFUSE      Crossmatch Result Compatible      Unit Number [id number]      Blood Component Type RED CELLS,LR      Unit division 00      Status of Unit ALLOCATED      Transfusion Status OK TO TRANSFUSE      Crossmatch Result Compatible     PREPARE RBC (CROSSMATCH)     Status: Normal   Collection Time   02/09/11  8:40 AM      Component Value Range Comment   Order Confirmation ORDER PROCESSED BY BLOOD BANK     AMMONIA     Status: Abnormal   Collection Time   02/09/11  8:47 AM      Component Value Range Comment   Ammonia <10 (*) 11 - 60 (umol/L) REPEATED TO VERIFY  CBC     Status: Abnormal   Collection Time   02/09/11  8:47 AM      Component Value Range Comment   WBC 8.6  4.0 - 10.5 (K/uL)    RBC 3.27 (*) 4.22 - 5.81 (MIL/uL)    Hemoglobin 9.4 (*) 13.0 - 17.0 (g/dL)    HCT 78.2 (*) 95.6 - 52.0 (%)    MCV 84.7  78.0 -  100.0 (fL)    MCH 28.7  26.0 - 34.0 (pg)    MCHC 33.9  30.0 - 36.0 (g/dL)    RDW 21.3  08.6 - 57.8 (%)    Platelets 215  150 - 400 (K/uL)   DIFFERENTIAL     Status: Normal   Collection Time   02/09/11  8:47 AM      Component Value Range Comment   Neutrophils Relative 61  43 - 77 (%)    Neutro Abs 5.3  1.7 - 7.7 (K/uL)    Lymphocytes Relative 33  12 - 46 (%)    Lymphs Abs 2.9  0.7 - 4.0 (K/uL)    Monocytes Relative 6  3 - 12 (%)    Monocytes Absolute 0.5  0.1 - 1.0 (K/uL)    Eosinophils Relative 0  0 - 5 (%)    Eosinophils Absolute 0.0  0.0 - 0.7 (K/uL)    Basophils Relative 0  0 - 1 (%)    Basophils Absolute 0.0  0.0 - 0.1 (K/uL)   COMPREHENSIVE METABOLIC PANEL     Status: Abnormal   Collection Time   02/09/11  8:47 AM      Component Value Range Comment   Sodium 136  135 - 145 (mEq/L)    Potassium 5.0  3.5 - 5.1 (mEq/L)    Chloride 102  96 - 112 (mEq/L)    CO2 21  19 - 32 (mEq/L)    Glucose, Bld 353 (*) 70 - 99 (mg/dL)    BUN 55 (*) 6 - 23 (mg/dL)    Creatinine, Ser 4.69 (*) 0.50 - 1.35 (mg/dL)    Calcium 8.0 (*) 8.4 - 10.5 (mg/dL)    Total Protein 5.5 (*) 6.0 - 8.3 (g/dL)    Albumin 2.7 (*) 3.5 - 5.2 (g/dL)    AST 30  0 - 37 (U/L)  ALT 38  0 - 53 (U/L)    Alkaline Phosphatase 51  39 - 117 (U/L)    Total Bilirubin 0.3  0.3 - 1.2 (mg/dL)    GFR calc non Af Amer 36 (*) >90 (mL/min)    GFR calc Af Amer 41 (*) >90 (mL/min)   LIPASE, BLOOD     Status: Abnormal   Collection Time   02/09/11  8:47 AM      Component Value Range Comment   Lipase 64 (*) 11 - 59 (U/L)   APTT     Status: Normal   Collection Time   02/09/11  8:47 AM      Component Value Range Comment   aPTT 33  24 - 37 (seconds)   PROTIME-INR     Status: Abnormal   Collection Time   02/09/11  8:47 AM      Component Value Range Comment   Prothrombin Time 15.4 (*) 11.6 - 15.2 (seconds)    INR 1.19  0.00 - 1.49    OCCULT BLOOD, POC DEVICE     Status: Normal   Collection Time   02/09/11  9:19 AM      Component  Value Range Comment   Fecal Occult Bld NEGATIVE     ETHANOL     Status: Normal   Collection Time   02/09/11 10:18 AM      Component Value Range Comment   Alcohol, Ethyl (B) <11  0 - 11 (mg/dL)   URINE RAPID DRUG SCREEN (HOSP PERFORMED)     Status: Abnormal   Collection Time   02/09/11 10:20 AM      Component Value Range Comment   Opiates NONE DETECTED  NONE DETECTED     Cocaine NONE DETECTED  NONE DETECTED     Benzodiazepines NONE DETECTED  NONE DETECTED     Amphetamines NONE DETECTED  NONE DETECTED     Tetrahydrocannabinol POSITIVE (*) NONE DETECTED     Barbiturates NONE DETECTED  NONE DETECTED    GLUCOSE, CAPILLARY     Status: Abnormal   Collection Time   02/09/11 11:23 AM      Component Value Range Comment   Glucose-Capillary 263 (*) 70 - 99 (mg/dL)    Dg Chest Port 1 View  02/09/2011  *RADIOLOGY REPORT*  Clinical Data: GI bleed.  Admission.  PORTABLE CHEST - 1 VIEW  Comparison: 10/13/2010.  Findings: Minimal peribronchial thickening probably normal versus reactive changes and unchanged. No infiltrate, congestive heart failure or pneumothorax.  Heart size within normal limits.  IMPRESSION: Minimal peribronchial thickening.  Original Report Authenticated By: Fuller Canada, M.D.    ROS 12 point ROS negative, except as noted above in HPI  Blood pressure 113/66, pulse 88, temperature 98.9 F (37.2 C), temperature source Oral, resp. rate 13, height 5\' 4"  (1.626 m), weight 158 lb (71.668 kg), SpO2 100.00%. Physical Exam  General: alert, cooperative and oriented x 5 HEENT: PERRLA, extra ocular movement intact and no scleral icterus.  Heart: S1, S2 normal, no murmur, rub or gallop, regular rate and rhythm Lungs: clear to auscultation, no wheezes or rales and unlabored breathing Abdomen: + bowel sounds, no palpable hepatomegaly, mild lower abd tenderness diffusely  Extremities: extremities normal, atraumatic, no cyanosis or edema Skin:no rashes, no jaundice Neurology: normal without  focal findings, mental status, speech normal, alert and oriented x3, PERLA and reflexes normal and symmetric  Assessment/Plan 60 YOM with PMHx/o Hep C and remote hx/o heavy ETOH abuse here with upper GI bleed  Upper GI bleed: Currently getting transfused. GI was consulted by EDP at my request. DDx includes esophageal varices, PUD, esophagitis. Mallory Weiss vs vascular lesion. Will check post transfusion CBC pending 2 units currently being run. IVF @ 150/hr. High dose PPI. Will hold ASA and cilostazol. NGT in place.  (pending formal med rec from pharmacy).   Hep C: Will check hepatitis panel to check serology load. Pt noted to not be on interferon or ribavirin tx. Will also check abdominal u/s to evaluate for cirrhotic changes that may lean dx in direction of possible esophageal varices. No signs of acute hepatitis on clinical exam or in LFTs. Will continue to follow. INR and synthetic function labs somewhat reassuring.   ARF: Likely secondary to prerenal etiology in setting of active GI bleeding. S/p IVF bolus in ED. Currently on MIVF. Will recheck Cr in am.   DM: A1C. SSI. Holding oral diabetic meds.   HTN: Holding ACE in setting of GI bleed and initially LLN BPs.   CAD: clinically stable. No reports of CP. EKG w/ NSR. No signs of myocardial strain. Will continue to follow. ASA being held in setting of UGI bleed.   FEN/GI: NS @ 150 once admitted. Currently getting transfused. NPO pending GI c/s. High dose PPI.   Code Status: Full Code  Dispo: Pending further evaluation.         Derotha Fishbaugh 02/09/2011, 12:13 PM

## 2011-02-09 NOTE — Consult Note (Signed)
[facility] Consult Note  Referring Provider: No ref. provider found Primary Care Physician:  Elijio Miles, MD Primary Gastroenterologist:  Dr.  Antony Contras Complaint: Vomiting blood HPI: [name] is an 60 y.o. black male.  Who presents with chief complaint of vomiting blood. He states his been having intermittently since Thanksgiving with no black stools. He did not seek medical attention until he had worsening coffee ground and dark red emesis last night and weakness. He has a history of hepatitis C remotely which is apparently followed at [facility]. He has never been treated before. He is very much history of alcohol use. He is not aware of having any cirrhosis. He gives a vague history of having had bouts of this before and thinks that they have not been able to determine the source of his bleeding but I do not have records available at present. He thinks he's had an endoscopy at this hospital in the last 2 months but I cannot find it in the system.  Past Medical History  Diagnosis Date  . Diabetes mellitus   . Hypertension   . Coronary artery disease   . Hepatitis C   . High cholesterol     Past Surgical History  Procedure Date  . Joint replacement     Medications Prior to Admission  Medication Dose Route Frequency Provider Last Rate Last Dose  . 0.9 %  sodium chloride infusion  999 mL Intravenous Once Hilario Quarry, MD 1,000 mL/hr at 02/09/11 0840 999 mL at 02/09/11 0840  . butamben-tetracaine-benzocaine (CETACAINE) spray    PRN Barrie Folk, MD   2 spray at 02/09/11 1451  . fentaNYL NICU IV Syringe 50 mcg/mL    PRN Barrie Folk, MD   25 mcg at 02/09/11 1455  . midazolam (VERSED) 10 MG/2ML injection    PRN Barrie Folk, MD   2 mg at 02/09/11 1455  . ondansetron (ZOFRAN) 4 MG/2ML injection        4 mg at 02/09/11 0841  . pantoprazole (PROTONIX) 80 mg in sodium chloride 0.9 % 100 mL IVPB  80 mg Intravenous Once Hilario Quarry, MD   80 mg at 02/09/11 0942  . sodium  chloride 0.9 % bolus 1,000 mL  1,000 mL Intravenous Once Hilario Quarry, MD   1,000 mL at 02/09/11 1009   No current outpatient prescriptions on file as of 02/09/2011.    Allergies: No Known Allergies  History reviewed. No pertinent family history.  Social History:  reports that he has been smoking Cigars.  He does not have any smokeless tobacco history on file. He reports that he does not use illicit drugs. His alcohol history not on file.  Negative except for the above   Blood pressure 112/73, pulse 107, temperature 99.7 F (37.6 C), temperature source Oral, resp. rate 20, height 5\' 4"  (1.626 m), weight 71.668 kg (158 lb), SpO2 100.00%. Head: Normocephalic, without obvious abnormality, atraumatic Neck: no adenopathy, no carotid bruit, no JVD, supple, symmetrical, trachea midline and thyroid not enlarged, symmetric, no tenderness/mass/nodules Resp: clear to auscultation bilaterally Cardio: regular rate and rhythm, S1, S2 normal, no murmur, click, rub or gallop GI: Abdomen soft slightly distended with normoactive bowel sounds no hepatomegaly masses or guarding. Extremities: extremities normal, atraumatic, no cyanosis or edema  Results for orders placed during the hospital encounter of 02/09/11 (from the past 48 hour(s))  LACTIC ACID, PLASMA     Status: Abnormal   Collection Time   02/09/11  8:37 AM      Component Value Range Comment   Lactic Acid, Venous 4.0 (*) 0.5 - 2.2 (mmol/L)   TYPE AND SCREEN     Status: Normal (Preliminary result)   Collection Time   02/09/11  8:40 AM      Component Value Range Comment   ABO/RH(D) A POS      Antibody Screen NEG      Sample Expiration 02/12/2011      Unit Number [id number]      Blood Component Type RED CELLS,LR      Unit division 00      Status of Unit ALLOCATED      Transfusion Status OK TO TRANSFUSE      Crossmatch Result Compatible      Unit Number [id number]      Blood Component Type RED CELLS,LR      Unit division 00      Status of  Unit ALLOCATED      Transfusion Status OK TO TRANSFUSE      Crossmatch Result Compatible      Unit Number [id number]      Blood Component Type RED CELLS,LR      Unit division 00      Status of Unit ISSUED      Transfusion Status OK TO TRANSFUSE      Crossmatch Result Compatible      Unit Number [id number]      Blood Component Type RED CELLS,LR      Unit division 00      Status of Unit ALLOCATED      Transfusion Status OK TO TRANSFUSE      Crossmatch Result Compatible     PREPARE RBC (CROSSMATCH)     Status: Normal   Collection Time   02/09/11  8:40 AM      Component Value Range Comment   Order Confirmation ORDER PROCESSED BY BLOOD BANK     AMMONIA     Status: Abnormal   Collection Time   02/09/11  8:47 AM      Component Value Range Comment   Ammonia <10 (*) 11 - 60 (umol/L) REPEATED TO VERIFY  CBC     Status: Abnormal   Collection Time   02/09/11  8:47 AM      Component Value Range Comment   WBC 8.6  4.0 - 10.5 (K/uL)    RBC 3.27 (*) 4.22 - 5.81 (MIL/uL)    Hemoglobin 9.4 (*) 13.0 - 17.0 (g/dL)    HCT 62.9 (*) 52.8 - 52.0 (%)    MCV 84.7  78.0 - 100.0 (fL)    MCH 28.7  26.0 - 34.0 (pg)    MCHC 33.9  30.0 - 36.0 (g/dL)    RDW 41.3  24.4 - 01.0 (%)    Platelets 215  150 - 400 (K/uL)   DIFFERENTIAL     Status: Normal   Collection Time   02/09/11  8:47 AM      Component Value Range Comment   Neutrophils Relative 61  43 - 77 (%)    Neutro Abs 5.3  1.7 - 7.7 (K/uL)    Lymphocytes Relative 33  12 - 46 (%)    Lymphs Abs 2.9  0.7 - 4.0 (K/uL)    Monocytes Relative 6  3 - 12 (%)    Monocytes Absolute 0.5  0.1 - 1.0 (K/uL)    Eosinophils Relative 0  0 - 5 (%)    Eosinophils Absolute 0.0  0.0 -  0.7 (K/uL)    Basophils Relative 0  0 - 1 (%)    Basophils Absolute 0.0  0.0 - 0.1 (K/uL)   COMPREHENSIVE METABOLIC PANEL     Status: Abnormal   Collection Time   02/09/11  8:47 AM      Component Value Range Comment   Sodium 136  135 - 145 (mEq/L)    Potassium 5.0  3.5 - 5.1 (mEq/L)     Chloride 102  96 - 112 (mEq/L)    CO2 21  19 - 32 (mEq/L)    Glucose, Bld 353 (*) 70 - 99 (mg/dL)    BUN 55 (*) 6 - 23 (mg/dL)    Creatinine, Ser 1.61 (*) 0.50 - 1.35 (mg/dL)    Calcium 8.0 (*) 8.4 - 10.5 (mg/dL)    Total Protein 5.5 (*) 6.0 - 8.3 (g/dL)    Albumin 2.7 (*) 3.5 - 5.2 (g/dL)    AST 30  0 - 37 (U/L)    ALT 38  0 - 53 (U/L)    Alkaline Phosphatase 51  39 - 117 (U/L)    Total Bilirubin 0.3  0.3 - 1.2 (mg/dL)    GFR calc non Af Amer 36 (*) >90 (mL/min)    GFR calc Af Amer 41 (*) >90 (mL/min)   LIPASE, BLOOD     Status: Abnormal   Collection Time   02/09/11  8:47 AM      Component Value Range Comment   Lipase 64 (*) 11 - 59 (U/L)   APTT     Status: Normal   Collection Time   02/09/11  8:47 AM      Component Value Range Comment   aPTT 33  24 - 37 (seconds)   PROTIME-INR     Status: Abnormal   Collection Time   02/09/11  8:47 AM      Component Value Range Comment   Prothrombin Time 15.4 (*) 11.6 - 15.2 (seconds)    INR 1.19  0.00 - 1.49    OCCULT BLOOD, POC DEVICE     Status: Normal   Collection Time   02/09/11  9:19 AM      Component Value Range Comment   Fecal Occult Bld NEGATIVE     ETHANOL     Status: Normal   Collection Time   02/09/11 10:18 AM      Component Value Range Comment   Alcohol, Ethyl (B) <11  0 - 11 (mg/dL)   URINE RAPID DRUG SCREEN (HOSP PERFORMED)     Status: Abnormal   Collection Time   02/09/11 10:20 AM      Component Value Range Comment   Opiates NONE DETECTED  NONE DETECTED     Cocaine NONE DETECTED  NONE DETECTED     Benzodiazepines NONE DETECTED  NONE DETECTED     Amphetamines NONE DETECTED  NONE DETECTED     Tetrahydrocannabinol POSITIVE (*) NONE DETECTED     Barbiturates NONE DETECTED  NONE DETECTED    GLUCOSE, CAPILLARY     Status: Abnormal   Collection Time   02/09/11 11:23 AM      Component Value Range Comment   Glucose-Capillary 263 (*) 70 - 99 (mg/dL)    Dg Chest Port 1 View  02/09/2011  *RADIOLOGY REPORT*  Clinical Data: GI  bleed.  Admission.  PORTABLE CHEST - 1 VIEW  Comparison: 10/13/2010.  Findings: Minimal peribronchial thickening probably normal versus reactive changes and unchanged. No infiltrate, congestive heart failure or pneumothorax.  Heart  size within normal limits.  IMPRESSION: Minimal peribronchial thickening.  Original Report Authenticated By: Fuller Canada, M.D.    Assessment: 1. Hematemesis 2. History of hepatitis C by history Plan:  Proceed with diagnostic and possibly therapeutic EGD in addition to PPI, banding of varices if present etc. Kingston Shawgo C 02/09/2011, 2:59 PM

## 2011-02-09 NOTE — ED Notes (Signed)
CBG [id number] notified RN

## 2011-02-09 NOTE — ED Provider Notes (Addendum)
History     CSN: [id number] Arrival date & time: 02/09/2011  8:11 AM   First MD Initiated Contact with Patient 02/09/11 [phone]      Chief Complaint  Patient presents with  . GI Bleeding    (Consider location/radiation/quality/duration/timing/severity/associated sxs/prior treatment) HPI Patient vomited after eating last night and vomited up dark blood.  Patient felt lightheaded and felt nauseated again and vomited dark blood again then had diarrhea x 1.  Patient vomited blood 4-5 ties through night.  Patient with increasing weakness.  Patient with lower abdominal pain and burning in face.  Patient with history of etoh abuse sober x 17 years, history of hep c x 10 years.  Followed by Dr. Perrin Maltese and Dr. Lucianne Muss- unknown gi-states has seen multiple.    Past Medical History  Diagnosis Date  . Diabetes mellitus   . Hypertension   . Coronary artery disease   . Hepatitis C   . High cholesterol     Past Surgical History  Procedure Date  . Joint replacement     History reviewed. No pertinent family history.  History  Substance Use Topics  . Smoking status: Current Everyday Smoker    Types: Cigars  . Smokeless tobacco: Not on file  . Alcohol Use:      former drinker      Review of Systems  Allergies  Review of patient's allergies indicates no known allergies.  Home Medications  No current outpatient prescriptions on file.  BP 81/43  Pulse 92  Temp(Src) 97.4 F (36.3 C) (Oral)  Resp 20  Ht 5\' 4"  (1.626 m)  Wt 158 lb (71.668 kg)  BMI 27.12 kg/m2  SpO2 100%  Physical Exam  ED Course  Procedures (including critical care time)   Labs Reviewed  TYPE AND SCREEN  AMMONIA  CBC  DIFFERENTIAL  COMPREHENSIVE METABOLIC PANEL  POCT OCCULT BLOOD STOOL, DEVICE  LACTIC ACID, PLASMA  LIPASE, BLOOD  APTT  PROTIME-INR   No results found.   No diagnosis found.    MDM  Patient with decreased bp after first liter of blood.  Plan to order transfusion.    Patient with  bp increased to [id number].  Initial labs reviewed and patient hyperglycemic with renal insufficiency and anemia.  Plan additional fluid bolus and recheck fsbs.       Date: 02/09/2011  Rate: 92  Rhythm: normal sinus rhythm  QRS Axis: normal  Intervals: normal  ST/T Wave abnormalities: nonspecific ST changes  Conduction Disutrbances:none  Narrative Interpretation:   Old EKG Reviewed: rate has changed by 5 beats/minute    Hilario Quarry, MD 02/09/11 1058  Results for orders placed during the hospital encounter of 02/09/11  TYPE AND SCREEN      Component Value Range   ABO/RH(D) A POS     Antibody Screen NEG     Sample Expiration 02/12/2011     Unit Number [id number]     Blood Component Type RED CELLS,LR     Unit division 00     Status of Unit ALLOCATED     Transfusion Status OK TO TRANSFUSE     Crossmatch Result Compatible     Unit Number [id number]     Blood Component Type RED CELLS,LR     Unit division 00     Status of Unit ALLOCATED     Transfusion Status OK TO TRANSFUSE     Crossmatch Result Compatible     Unit Number [id number]     Blood Component Type  RED CELLS,LR     Unit division 00     Status of Unit ISSUED     Transfusion Status OK TO TRANSFUSE     Crossmatch Result Compatible     Unit Number [id number]     Blood Component Type RED CELLS,LR     Unit division 00     Status of Unit ALLOCATED     Transfusion Status OK TO TRANSFUSE     Crossmatch Result Compatible    AMMONIA      Component Value Range   Ammonia <10 (*) 11 - 60 (umol/L)  CBC      Component Value Range   WBC 8.6  4.0 - 10.5 (K/uL)   RBC 3.27 (*) 4.22 - 5.81 (MIL/uL)   Hemoglobin 9.4 (*) 13.0 - 17.0 (g/dL)   HCT 40.9 (*) 81.1 - 52.0 (%)   MCV 84.7  78.0 - 100.0 (fL)   MCH 28.7  26.0 - 34.0 (pg)   MCHC 33.9  30.0 - 36.0 (g/dL)   RDW 91.4  78.2 - 95.6 (%)   Platelets 215  150 - 400 (K/uL)  DIFFERENTIAL      Component Value Range   Neutrophils Relative 61  43 - 77 (%)   Neutro Abs 5.3  1.7 - 7.7  (K/uL)   Lymphocytes Relative 33  12 - 46 (%)   Lymphs Abs 2.9  0.7 - 4.0 (K/uL)   Monocytes Relative 6  3 - 12 (%)   Monocytes Absolute 0.5  0.1 - 1.0 (K/uL)   Eosinophils Relative 0  0 - 5 (%)   Eosinophils Absolute 0.0  0.0 - 0.7 (K/uL)   Basophils Relative 0  0 - 1 (%)   Basophils Absolute 0.0  0.0 - 0.1 (K/uL)  COMPREHENSIVE METABOLIC PANEL      Component Value Range   Sodium 136  135 - 145 (mEq/L)   Potassium 5.0  3.5 - 5.1 (mEq/L)   Chloride 102  96 - 112 (mEq/L)   CO2 21  19 - 32 (mEq/L)   Glucose, Bld 353 (*) 70 - 99 (mg/dL)   BUN 55 (*) 6 - 23 (mg/dL)   Creatinine, Ser 2.13 (*) 0.50 - 1.35 (mg/dL)   Calcium 8.0 (*) 8.4 - 10.5 (mg/dL)   Total Protein 5.5 (*) 6.0 - 8.3 (g/dL)   Albumin 2.7 (*) 3.5 - 5.2 (g/dL)   AST 30  0 - 37 (U/L)   ALT 38  0 - 53 (U/L)   Alkaline Phosphatase 51  39 - 117 (U/L)   Total Bilirubin 0.3  0.3 - 1.2 (mg/dL)   GFR calc non Af Amer 36 (*) >90 (mL/min)   GFR calc Af Amer 41 (*) >90 (mL/min)  LACTIC ACID, PLASMA      Component Value Range   Lactic Acid, Venous 4.0 (*) 0.5 - 2.2 (mmol/L)  LIPASE, BLOOD      Component Value Range   Lipase 64 (*) 11 - 59 (U/L)  APTT      Component Value Range   aPTT 33  24 - 37 (seconds)  PROTIME-INR      Component Value Range   Prothrombin Time 15.4 (*) 11.6 - 15.2 (seconds)   INR 1.19  0.00 - 1.49   PREPARE RBC (CROSSMATCH)      Component Value Range   Order Confirmation ORDER PROCESSED BY BLOOD BANK    OCCULT BLOOD, POC DEVICE      Component Value Range   Fecal Occult Bld NEGATIVE  Patient's care discussed with [name]. He will come down to see patient and possible endoscopy today. Patient has also been discussed with internal medicine clinic and they have admitted patient.  CRITICAL CARE Performed by: Hilario Quarry   Total critical care time:75  Critical care time was exclusive of separately billable procedures and treating other patients.  Critical care was necessary to treat or  prevent imminent or life-threatening deterioration.  Critical care was time spent personally by me on the following activities: development of treatment plan with patient and/or surrogate as well as nursing, discussions with consultants, evaluation of patient's response to treatment, examination of patient, obtaining history from patient or surrogate, ordering and performing treatments and interventions, ordering and review of laboratory studies, ordering and review of radiographic studies, pulse oximetry and re-evaluation of patient's condition.   Hilario Quarry, MD 02/09/11 1351  Hilario Quarry, MD 02/09/11 1351

## 2011-02-09 NOTE — ED Notes (Signed)
Pt here for gi bleed, onset last night, sts that vomiting blood since 1930, abd pain over the last 8 weeks. Denies dark tarry or color change to stools.

## 2011-02-09 NOTE — Brief Op Note (Signed)
02/09/2011  3:18 PM  PATIENT:  [name]  60 y.o. 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 Patient had an actively bleeding Mallory-Weiss tear which was patent and not it with 2 endoclips successfully. Do 2 placement of these clips across the lumen at the GE junction, a light did not to explore the stomach and duodenum although the proximal stomach was filled with blood. It would appear to distally unlikely that he had another significant bleeding lesion. However given the future GI bleeding with repeat EGD. No varices were noted.

## 2011-02-09 NOTE — ED Notes (Signed)
Pt on stretcher, nad noted, abc intact, blood products infusing.

## 2011-02-10 LAB — GLUCOSE, CAPILLARY
Glucose-Capillary: 122 mg/dL — ABNORMAL HIGH (ref 70–99)
Glucose-Capillary: 102 mg/dL — ABNORMAL HIGH (ref 70–99)

## 2011-02-10 LAB — COMPREHENSIVE METABOLIC PANEL
AST: 39 U/L — ABNORMAL HIGH (ref 0–37)
Albumin: 2.7 g/dL — ABNORMAL LOW (ref 3.5–5.2)
Alkaline Phosphatase: 44 U/L (ref 39–117)
Chloride: 108 mEq/L (ref 96–112)
Potassium: 3.8 mEq/L (ref 3.5–5.1)
Sodium: 139 mEq/L (ref 135–145)
Total Bilirubin: 0.6 mg/dL (ref 0.3–1.2)
Total Protein: 5.2 g/dL — ABNORMAL LOW (ref 6.0–8.3)

## 2011-02-10 LAB — CBC
HCT: 26.1 % — ABNORMAL LOW (ref 39.0–52.0)
MCH: 29.6 pg (ref 26.0–34.0)
MCV: 83.9 fL (ref 78.0–100.0)
Platelets: 154 10*3/uL (ref 150–400)
RDW: 13.9 % (ref 11.5–15.5)
WBC: 10.6 10*3/uL — ABNORMAL HIGH (ref 4.0–10.5)

## 2011-02-10 LAB — HEMOGLOBIN AND HEMATOCRIT, BLOOD
HCT: 25.7 % — ABNORMAL LOW (ref 39.0–52.0)
Hemoglobin: 9 g/dL — ABNORMAL LOW (ref 13.0–17.0)

## 2011-02-10 MED ORDER — SUCRALFATE 1 GM/10ML PO SUSP
1.0000 g | Freq: Four times a day (QID) | ORAL | Status: DC
  Administered 2011-02-10 – 2011-02-11 (×4): 1 g via ORAL
  Filled 2011-02-10 (×9): qty 10

## 2011-02-10 MED ORDER — PANTOPRAZOLE SODIUM 40 MG PO TBEC
40.0000 mg | DELAYED_RELEASE_TABLET | Freq: Two times a day (BID) | ORAL | Status: DC
  Administered 2011-02-10 – 2011-02-11 (×2): 40 mg via ORAL
  Filled 2011-02-10 (×2): qty 1

## 2011-02-10 NOTE — Progress Notes (Signed)
FMTS Daily R3 Progress Note  Subjective: Patient is feeling significantly improved. He has no abdominal pain at this time. He denies any nausea. He is comfortable with the plan of keeping him n.p.o. until GI evaluate him for further intervention.  I have reviewed the patient's medications.  Objective Temp:  [97.4 F (36.3 C)-99.7 F (37.6 C)] 98.4 F (36.9 C) (12/08 0420) Pulse Rate:  [86-107] 92  (12/07 1900) Resp:  [11-45] 12  (12/07 1900) BP: (81-142)/(41-76) 113/57 mmHg (12/08 0352) SpO2:  [89 %-100 %] 100 % (12/07 1900) Weight:  [158 lb (71.668 kg)-168 lb 10.4 oz (76.5 kg)] 168 lb 10.4 oz (76.5 kg) (12/08 0005)   Intake/Output Summary (Last 24 hours) at 02/10/11 0632 Last data filed at 02/10/11 0431  Gross per 24 hour  Intake   4740 ml  Output   1400 ml  Net   3340 ml    CBG (last 3)   Basename 02/09/11 2156 02/09/11 1624 02/09/11 1123  GLUCAP 119* 179* 263*    General: No acute distress resting comfortably CV: Regular rate and rhythm no murmur Pulm: Clear to auscultation bilaterally Abd: Palpation elicits mild tenderness in the epigastric area Ext: No edema, warm Neuro: Alert and oriented x3, moving all 4 extremities  Labs and Imaging  Lab 02/10/11 0017 02/09/11 1740 02/09/11 0847  WBC -- 11.3* 8.6  HGB 8.0* 9.0* 9.4*  HCT 23.2* 25.8* 27.7*  PLT -- 181 215     Lab 02/09/11 0847  NA 136  K 5.0  CL 102  CO2 21  BUN 55*  CREATININE 1.96*  LABGLOM --  GLUCOSE 353*  CALCIUM 8.0*    Lab Results  Component Value Date   HGBA1C 7.3* 02/09/2011      Assessment and Plan 73 YOM with PMHx/o Hep C and remote hx/o heavy ETOH abuse here with upper GI bleed  1. Upper GI bleed: Patient was given 2 units of blood over yesterday. The second unit was given after his repeat hemoglobin trended down to 8 from 9 previously and 10.4 at admission. This morning he is not in any pain he does not feel nauseous. [facility] will see him this morning for further treatment. IVF  @ 150/hr. High dose PPI.   2. Hep C: Hepatitis labs pending. Abdominal ultrasound pending. Will continue to follow. INR and synthetic function labs somewhat reassuring.   3. ARF: Likely secondary to prerenal etiology in setting of active GI bleeding. S/p IVF bolus in ED. Currently on MIVF. Repeat creatinine pending.  4. DM: Blood sugars are controlled on sliding-scale insulin.  5. HTN: Patient currently was stable but low blood pressures. We'll continue to follow.  6. CAD: clinically stable. No reports of CP. Low threshold for transfusion.  7. FEN/GI: NS @ 150.  Currently getting transfused. NPO pending further GI back. High dose PPI.   Code Status: Full Code      Jaydn Fincher Pager: [phone] 02/10/2011, 6:32 AM

## 2011-02-10 NOTE — Progress Notes (Signed)
Family Medicine Teaching Service Attending Note  I interviewed and examined patient [name] and reviewed their tests and x-rays.  I discussed with Dr. Hulen Luster and reviewed their note for today.  I agree with their assessment and plan.     Additionally  Appreciate GI consult - Will proceed with their recommendations

## 2011-02-10 NOTE — H&P (Signed)
Family Medicine Teaching Service Attending Note  I interviewed and examined patient [name] and reviewed their tests and x-rays.  I discussed with Dr. Hulen Luster and reviewed their note for today.  I agree with their assessment and plan.     Additionally  See progress not for today for details

## 2011-02-10 NOTE — Progress Notes (Signed)
[facility] Progress Note  Subjective: Patient states he feels much better today is hungry and has no further nausea or vomiting or black stools  Objective: Vital signs in last 24 hours: Temp:  [97.8 F (36.6 C)-99.7 F (37.6 C)] 97.9 F (36.6 C) (12/08 0824) Pulse Rate:  [75-107] 77  (12/08 1000) Resp:  [11-45] 19  (12/08 1000) BP: (94-142)/(41-76) 101/55 mmHg (12/08 1000) SpO2:  [99 %-100 %] 100 % (12/08 1000) Weight:  [76.5 kg (168 lb 10.4 oz)] 168 lb 10.4 oz (76.5 kg) (12/08 0005) Weight change:    PE: Unchanged  Lab Results: Results for orders placed during the hospital encounter of 02/09/11 (from the past 24 hour(s))  GLUCOSE, CAPILLARY     Status: Abnormal   Collection Time   02/09/11 11:23 AM      Component Value Range   Glucose-Capillary 263 (*) 70 - 99 (mg/dL)  GLUCOSE, CAPILLARY     Status: Abnormal   Collection Time   02/09/11  4:24 PM      Component Value Range   Glucose-Capillary 179 (*) 70 - 99 (mg/dL)   Comment 1 Documented in Chart     Comment 2 Notify RN    MRSA PCR SCREENING     Status: Normal   Collection Time   02/09/11  4:35 PM      Component Value Range   MRSA by PCR NEGATIVE  NEGATIVE   CBC     Status: Abnormal   Collection Time   02/09/11  5:40 PM      Component Value Range   WBC 11.3 (*) 4.0 - 10.5 (K/uL)   RBC 3.06 (*) 4.22 - 5.81 (MIL/uL)   Hemoglobin 9.0 (*) 13.0 - 17.0 (g/dL)   HCT 16.1 (*) 09.6 - 52.0 (%)   MCV 84.3  78.0 - 100.0 (fL)   MCH 29.4  26.0 - 34.0 (pg)   MCHC 34.9  30.0 - 36.0 (g/dL)   RDW 04.5  40.9 - 81.1 (%)   Platelets 181  150 - 400 (K/uL)  HEMOGLOBIN A1C     Status: Abnormal   Collection Time   02/09/11  5:40 PM      Component Value Range   Hemoglobin A1C 7.3 (*) <5.7 (%)   Mean Plasma Glucose 163 (*) <117 (mg/dL)  GLUCOSE, CAPILLARY     Status: Abnormal   Collection Time   02/09/11  9:56 PM      Component Value Range   Glucose-Capillary 119 (*) 70 - 99 (mg/dL)  HEMOGLOBIN AND HEMATOCRIT, BLOOD      Status: Abnormal   Collection Time   02/10/11 12:17 AM      Component Value Range   Hemoglobin 8.0 (*) 13.0 - 17.0 (g/dL)   HCT 91.4 (*) 78.2 - 52.0 (%)  COMPREHENSIVE METABOLIC PANEL     Status: Abnormal   Collection Time   02/10/11  6:55 AM      Component Value Range   Sodium 139  135 - 145 (mEq/L)   Potassium 3.8  3.5 - 5.1 (mEq/L)   Chloride 108  96 - 112 (mEq/L)   CO2 24  19 - 32 (mEq/L)   Glucose, Bld 114 (*) 70 - 99 (mg/dL)   BUN 23  6 - 23 (mg/dL)   Creatinine, Ser 9.56  0.50 - 1.35 (mg/dL)   Calcium 8.0 (*) 8.4 - 10.5 (mg/dL)   Total Protein 5.2 (*) 6.0 - 8.3 (g/dL)   Albumin 2.7 (*) 3.5 - 5.2 (g/dL)  AST 39 (*) 0 - 37 (U/L)   ALT 40  0 - 53 (U/L)   Alkaline Phosphatase 44  39 - 117 (U/L)   Total Bilirubin 0.6  0.3 - 1.2 (mg/dL)   GFR calc non Af Amer >90  >90 (mL/min)   GFR calc Af Amer >90  >90 (mL/min)  CBC     Status: Abnormal   Collection Time   02/10/11  6:55 AM      Component Value Range   WBC 10.6 (*) 4.0 - 10.5 (K/uL)   RBC 3.11 (*) 4.22 - 5.81 (MIL/uL)   Hemoglobin 9.2 (*) 13.0 - 17.0 (g/dL)   HCT 84.1 (*) 32.4 - 52.0 (%)   MCV 83.9  78.0 - 100.0 (fL)   MCH 29.6  26.0 - 34.0 (pg)   MCHC 35.2  30.0 - 36.0 (g/dL)   RDW 40.1  02.7 - 25.3 (%)   Platelets 154  150 - 400 (K/uL)  GLUCOSE, CAPILLARY     Status: Abnormal   Collection Time   02/10/11  8:26 AM      Component Value Range   Glucose-Capillary 122 (*) 70 - 99 (mg/dL)   Comment 1 Documented in Chart     Comment 2 Notify RN    HEMOGLOBIN AND HEMATOCRIT, BLOOD     Status: Abnormal   Collection Time   02/10/11  8:44 AM      Component Value Range   Hemoglobin 9.0 (*) 13.0 - 17.0 (g/dL)   HCT 66.4 (*) 40.3 - 52.0 (%)    Studies/Results: US Abdomen Complete  02/10/2011  *RADIOLOGY REPORT*  Clinical Data:  Hepatitis C.  Ethanol abuse.  Cirrhosis.  COMPLETE ABDOMINAL ULTRASOUND  Comparison:  05/18/2010.  Findings:  Gallbladder:  Normal gallbladder wall thickness at 2 mm.  No sonographic Murphy's sign.   No cholelithiasis.  Common bile duct:  7 mm, upper normal for age and similar to the prior exam of 05/18/2010.  No common duct stones are present.  Liver:  Micronodular surface contour of the liver is compatible with hepatic cirrhosis.  Liver is isoechoic when compared to the adjacent right kidney.  IVC:  Appears normal.  Pancreas:  Suboptimal visualization due to overlying bowel gas.  Spleen:  52 mm.  Negative for splenomegaly.  Normal echotexture.  Right Kidney:  Tiny exophytic right inferior pole renal cyst measuring 7 mm.  The right kidney measures 11 cm long axis. Normal echotexture.  Normal central sinus echo complex.  No calculi or hydronephrosis.  Left Kidney:  11.5 cm.  Normal echotexture.  Normal central sinus echo complex.  No calculi or hydronephrosis.Probable renal cyst on prior CT not visualized.  Abdominal aorta:  No aneurysm identified.  IMPRESSION: 1.  Negative for cholelithiasis or cholecystitis. 2.  Micronodular contour of the liver suggesting hepatic cirrhosis. No hepatic mass lesion. 3.  Unchanged borderline size of the common bile duct without common duct stone.  Original Report Authenticated By: Andreas Newport, M.D.   Dg Chest Port 1 View  02/09/2011  *RADIOLOGY REPORT*  Clinical Data: GI bleed.  Admission.  PORTABLE CHEST - 1 VIEW  Comparison: 10/13/2010.  Findings: Minimal peribronchial thickening probably normal versus reactive changes and unchanged. No infiltrate, congestive heart failure or pneumothorax.  Heart size within normal limits.  IMPRESSION: Minimal peribronchial thickening.  Original Report Authenticated By: Fuller Canada, M.D.      Assessment: Upper GI bleed from Mallory-Weiss tear, successfully Nodded with endoclips  Plan: 1. Advanced diet gradually, full liquid  diet today followed by soft mechanical tomorrow. If hemoglobin stable can probably go home tomorrow. Would discharge on Protonix 40 mg twice a day or equivalent as well as Carafate 10 cc 4 times a day  for one or 2 weeks. We'll sign off for now. The skull further GI and that needed. Tae Robak C 02/10/2011, 10:57 AM

## 2011-02-10 NOTE — Progress Notes (Signed)
Spoke with Dr. Doree Barthel.  Patient's hemoglobin had previously been 9 and is now 8.  Will give the second unit PRBC's as previously ordered, which was held pending collection of hemoglobin and hematocrit, and per doctor's instructions.

## 2011-02-11 DIAGNOSIS — K226 Gastro-esophageal laceration-hemorrhage syndrome: Secondary | ICD-10-CM

## 2011-02-11 DIAGNOSIS — N289 Disorder of kidney and ureter, unspecified: Secondary | ICD-10-CM

## 2011-02-11 DIAGNOSIS — K922 Gastrointestinal hemorrhage, unspecified: Secondary | ICD-10-CM

## 2011-02-11 DIAGNOSIS — D649 Anemia, unspecified: Secondary | ICD-10-CM

## 2011-02-11 LAB — GLUCOSE, CAPILLARY
Glucose-Capillary: 123 mg/dL — ABNORMAL HIGH (ref 70–99)
Glucose-Capillary: 269 mg/dL — ABNORMAL HIGH (ref 70–99)

## 2011-02-11 LAB — CBC
MCH: 28.8 pg (ref 26.0–34.0)
MCHC: 34.6 g/dL (ref 30.0–36.0)
Platelets: 158 10*3/uL (ref 150–400)
RDW: 14 % (ref 11.5–15.5)

## 2011-02-11 LAB — COMPREHENSIVE METABOLIC PANEL
ALT: 38 U/L (ref 0–53)
AST: 40 U/L — ABNORMAL HIGH (ref 0–37)
CO2: 23 mEq/L (ref 19–32)
Chloride: 110 mEq/L (ref 96–112)
GFR calc Af Amer: >90 mL/min (ref >90)
GFR calc non Af Amer: >90 mL/min (ref >90)
Glucose, Bld: 101 mg/dL — ABNORMAL HIGH (ref 70–99)
Sodium: 140 mEq/L (ref 135–145)
Total Bilirubin: 0.3 mg/dL (ref 0.3–1.2)

## 2011-02-11 MED ORDER — PANTOPRAZOLE SODIUM 40 MG PO TBEC: 40.0000 mg | DELAYED_RELEASE_TABLET | Freq: Two times a day (BID) | ORAL | Status: DC

## 2011-02-11 MED ORDER — SUCRALFATE 1 GM/10ML PO SUSP: 1.0000 g | Freq: Four times a day (QID) | ORAL | Status: AC

## 2011-02-11 NOTE — Progress Notes (Signed)
Family Medicine Teaching Service Daily Progress Note  Subjective: Patient is feeling much better, denies nausea, vomiting, abdominal pain, denies dark stools.  He would like to try "real food" for lunch, and would like to go home.  Objective: Vital signs in last 24 hours: Filed Vitals:   02/11/11 0350 02/11/11 0402 02/11/11 0500 02/11/11 0817  BP: 119/61   126/66  Pulse: 75 80  75  Temp:  98.1 F (36.7 C)  98.8 F (37.1 C)  TempSrc:  Oral  Oral  Resp:  16    Height:      Weight:   169 lb 12.1 oz (77 kg)   SpO2: 100% 100%  100%   Weight change: 11 lb 12.1 oz (5.332 kg)  Intake/Output Summary (Last 24 hours) at 02/11/11 0840 Last data filed at 02/11/11 0830  Gross per 24 hour  Intake   6560 ml  Output   5175 ml  Net   1385 ml   General appearance: alert, cooperative and no distress Eyes: PERRL, EOMIT Throat: lips, mucosa, and tongue normal; teeth and gums normal Neck: no adenopathy, supple, symmetrical, trachea midline and thyroid not enlarged, symmetric, no tenderness/mass/nodules Lungs: clear to auscultation bilaterally Heart: regular rate and rhythm, S1, S2 normal, no murmur, click, rub or gallop Abdomen: soft, non-tender; bowel sounds normal; no masses,  no organomegaly Extremities: extremities normal, atraumatic, no cyanosis or edema Pulses: 2+ and symmetric Neurologic: Grossly normal Lab Results: Lab Results  Component Value Date   WBC 6.7 02/11/2011   HGB 8.4* 02/11/2011   HCT 24.3* 02/11/2011   MCV 83.2 02/11/2011   PLT 158 02/11/2011   Lab Results  Component Value Date   CREATININE 0.61 02/11/2011   BUN 9 02/11/2011   NA 140 02/11/2011   K 3.8 02/11/2011   CL 110 02/11/2011   CO2 23 02/11/2011   Lab Results  Component Value Date   ALT 38 02/11/2011   AST 40* 02/11/2011   ALKPHOS 45 02/11/2011   BILITOT 0.3 02/11/2011    Micro Results: Recent Results (from the past 240 hour(s))  MRSA PCR SCREENING     Status: Normal   Collection Time   02/09/11  4:35 PM   Component Value Range Status Comment   MRSA by PCR NEGATIVE  NEGATIVE  Final    Studies/Results: No new Imaging.   Medications: I have reviewed the patient's current medications. Scheduled Meds:   . influenza  inactive virus vaccine  0.5 mL Intramuscular Tomorrow-1000  . insulin aspart  0-5 Units Subcutaneous QHS  . insulin aspart  0-9 Units Subcutaneous TID WC  . pantoprazole  40 mg Oral BID AC  . sucralfate  1 g Oral QID  . DISCONTD: pantoprazole (PROTONIX) IV  40 mg Intravenous Q12H   Continuous Infusions:   . sodium chloride 150 mL/hr at 02/11/11 0430   PRN Meds:.morphine injection, ondansetron (ZOFRAN) IV Assessment: [name] is a 60 y.o. male patient, Hospital day 2 for  1. GI bleed   2. Renal insufficiency   3. Anemia    Plan:  1) GI Bleed- s/p Mallory Weis tear clipping.  No further bleeding since procedure.  Hemoglobin down to 8.4 from 9.0 yesterday, appropriate considering IV fluids.  Will write for soft mechanical diet for lunch.  If he tolerates lunch well, anticipate d/c home with Protonix 40 mg po bid.   2) Acute Renal Failure- resolved today after fluid and blood resuscitation.   3) DM- blood sugars well controlled on SSI.  A1C 7.3 on admission, not on home DM medications, which is appropriate.  Will plan for him to follow up with his PCP for further monitoring.   4) FEN- will saline lock IV, allow pt to try soft mechanical diet.   5) DVT PPX- SCD's  6) Disposition- patient likely can be discharged home today with Protonix and follow up with his primary doctor next week.   LOS: 2 days   [name] 02/11/2011

## 2011-02-11 NOTE — Discharge Summary (Signed)
Physician Discharge Summary  Patient ID: [name] MRN: [id number] DOB/AGE: Dec 02, 1950 60 y.o.  Admit date: 02/09/2011 Discharge date: 02/11/2011  Admission Diagnoses: Hematemesis Discharge Diagnoses:  Active Problems:  Mallory-Weiss syndrome  Renal insufficiency  Anemia  GI bleed Diabetes Mellitus II Hepatitis C  Discharged Condition: good  Hospital Course: [name] is a 60 year old man with remote history of alcohol abuse who presented to the hospital complaining of 8 weeks of hematemesis.   1) GI- patient had an acute drop in hemoglobin (admission Hg was 9.4, dropped to 8.0).  Gastroenterology took the patient to the endoscopy suite, and found Mallory-Weiss tears that were clipped.  The patient was on high dose IV protonix, transitioned to PO protonix and carafate.  He was started on a clear liquid diet and advanced as tolerated to soft mechanical diet.  He had no further hematemesis nor dark tarry stools after the procedure 2) Anemia- patient was found to have acute blood loss anemia due to GI bleed.  He was transfused 2 units PRBC's during this admission, with appropriate response.  He had no further bleeding and his hemoglobin remained stable after GI procedure.  3) Acute Renal Insufficiency- patient found to have elevated creatinine of 1.9 on admission, however this improved with fluid and blood resuscitation, and was 0.6 the day of discharge.  4) Diabetes: Patient was placed on SSI and cbg's were well controlled during hospitalization.  He will be discharged on his home medication regimen.   Consults: GI  Significant Diagnostic and Therapeutic Studies: endoscopy: gastroscopy: with clip of Mallory Weiss tear 02/10/11  Treatments: IV hydration, Blood administration, insulin: regular   Discharge Exam: Blood pressure 130/94, pulse 105, temperature 98.6 F (37 C), temperature source Oral, resp. rate 16, height 5\' 4"  (1.626 m), weight 169 lb 12.1 oz (77 kg), SpO2  100.00%. General appearance: alert, cooperative and no distress  Eyes: PERRL, EOMIT  Throat: lips, mucosa, and tongue normal; teeth and gums normal  Neck: no adenopathy, supple, symmetrical, trachea midline and thyroid not enlarged, symmetric, no tenderness/mass/nodules  Lungs: clear to auscultation bilaterally  Heart: regular rate and rhythm, S1, S2 normal, no murmur, click, rub or gallop  Abdomen: soft, non-tender; bowel sounds normal; no masses, no organomegaly  Extremities: extremities normal, atraumatic, no cyanosis or edema  Pulses: 2+ and symmetric  Neurologic: Grossly normal   Disposition: Home  Discharge Orders    Future Appointments: Provider: Department: Dept Phone: Center:   02/22/2011 1:15 PM Aris Lot, MD [facility] [phone]     Future Orders Please Complete By Expires   Diet - low sodium heart healthy      Increase activity slowly      Call MD for:  persistant nausea and vomiting      Call MD for:  severe uncontrolled pain      Call MD for:  persistant dizziness or light-headedness      Call MD for:  extreme fatigue        Current Discharge Medication List    START taking these medications   Details  pantoprazole (PROTONIX) 40 MG tablet Take 1 tablet (40 mg total) by mouth 2 (two) times daily before a meal. Qty: 30 tablet, Refills: 0      CONTINUE these medications which have NOT CHANGED   Details  atorvastatin (LIPITOR) 10 MG tablet Take 10 mg by mouth daily.      cilostazol (PLETAL) 100 MG tablet Take 100 mg by mouth 2 (  two) times daily.     glipiZIDE (GLUCOTROL XL) 5 MG 24 hr tablet Take 5 mg by mouth daily.      lisinopril (PRINIVIL,ZESTRIL) 10 MG tablet Take 10 mg by mouth daily.      metFORMIN (GLUCOPHAGE-XR) 500 MG 24 hr tablet Take 1,000 mg by mouth 2 (two) times daily.      oxyCODONE-acetaminophen (PERCOCET) 5-325 MG per tablet Take 1 tablet by mouth every 6 (six) hours as needed. For pain     sertraline (ZOLOFT) 50 MG tablet  Take 50 mg by mouth daily.         Follow-up Information    Follow up with GUESS, AARON. Call in 1 day. (Call [phone] to make follow up appointment, let them know you should be seen before Christmas.)          Signed: Ishaq Maffei 02/11/2011, 2:50 PM

## 2011-02-11 NOTE — Progress Notes (Signed)
Family Medicine Teaching Service Attending Note  I interviewed and examined patient [name] and reviewed their tests and x-rays.  I discussed with Dr. Lula Olszewski and reviewed their note for today.  I agree with their assessment and plan.     Additionally  Will proceed as per GI recommendations

## 2011-02-11 NOTE — Progress Notes (Signed)
Pt discharged home, DC instructions given,Patient verbalized understanding,Awaiting a friend to pick him up.

## 2011-02-12 ENCOUNTER — Encounter (HOSPITAL_COMMUNITY): Payer: Self-pay | Admitting: Gastroenterology

## 2011-02-12 LAB — TYPE AND SCREEN
Unit division: 0
Unit division: 0
Unit division: 0

## 2011-02-12 LAB — HEPATITIS PANEL, ACUTE: Hep A IgM: NEGATIVE

## 2011-02-13 NOTE — Discharge Summary (Signed)
I have reviewed this discharge summary and agree.    

## 2011-02-22 ENCOUNTER — Ambulatory Visit: Payer: BC Managed Care – PPO | Admitting: Gastroenterology

## 2011-07-04 ENCOUNTER — Encounter (HOSPITAL_COMMUNITY): Payer: Self-pay

## 2011-07-04 ENCOUNTER — Emergency Department (HOSPITAL_COMMUNITY): Payer: Medicaid Other

## 2011-07-04 ENCOUNTER — Emergency Department (HOSPITAL_COMMUNITY)
Admission: EM | Admit: 2011-07-04 | Discharge: 2011-07-05 | Disposition: A | Discharge status: Home / Self Care | Payer: Medicaid Other | Attending: Emergency Medicine | Admitting: Emergency Medicine

## 2011-07-04 DIAGNOSIS — I2510 Atherosclerotic heart disease of native coronary artery without angina pectoris: Secondary | ICD-10-CM | POA: Insufficient documentation

## 2011-07-04 DIAGNOSIS — K5900 Constipation, unspecified: Secondary | ICD-10-CM | POA: Insufficient documentation

## 2011-07-04 DIAGNOSIS — Z79899 Other long term (current) drug therapy: Secondary | ICD-10-CM | POA: Insufficient documentation

## 2011-07-04 DIAGNOSIS — E7800 Pure hypercholesterolemia, unspecified: Secondary | ICD-10-CM | POA: Insufficient documentation

## 2011-07-04 DIAGNOSIS — E119 Type 2 diabetes mellitus without complications: Secondary | ICD-10-CM | POA: Insufficient documentation

## 2011-07-04 DIAGNOSIS — I10 Essential (primary) hypertension: Secondary | ICD-10-CM | POA: Insufficient documentation

## 2011-07-04 LAB — COMPREHENSIVE METABOLIC PANEL
ALT: 63 U/L — ABNORMAL HIGH (ref 0–53)
AST: 49 U/L — ABNORMAL HIGH (ref 0–37)
Albumin: 3.7 g/dL (ref 3.5–5.2)
CO2: 23 mEq/L (ref 19–32)
Chloride: 99 mEq/L (ref 96–112)
Creatinine, Ser: 0.61 mg/dL (ref 0.50–1.35)
GFR calc non Af Amer: >90 mL/min (ref >90)
Potassium: 4 mEq/L (ref 3.5–5.1)
Sodium: 132 mEq/L — ABNORMAL LOW (ref 135–145)
Total Bilirubin: 0.5 mg/dL (ref 0.3–1.2)

## 2011-07-04 LAB — CBC
Platelets: 195 10*3/uL (ref 150–400)
RBC: 5.38 MIL/uL (ref 4.22–5.81)
RDW: 15.6 % — ABNORMAL HIGH (ref 11.5–15.5)
WBC: 8.8 10*3/uL (ref 4.0–10.5)

## 2011-07-04 MED ORDER — SODIUM CHLORIDE 0.9 % IV BOLUS (SEPSIS)
1000.0000 mL | Freq: Once | INTRAVENOUS | Status: AC
  Administered 2011-07-04: 1000 mL via INTRAVENOUS

## 2011-07-04 MED ORDER — MORPHINE SULFATE 4 MG/ML IJ SOLN
4.0000 mg | Freq: Once | INTRAMUSCULAR | Status: AC
  Administered 2011-07-04: 4 mg via INTRAVENOUS
  Filled 2011-07-04: qty 1

## 2011-07-04 MED ORDER — ONDANSETRON HCL 4 MG/2ML IJ SOLN
4.0000 mg | Freq: Once | INTRAMUSCULAR | Status: AC
  Administered 2011-07-04: 4 mg via INTRAVENOUS
  Filled 2011-07-04: qty 2

## 2011-07-04 MED ORDER — FLEET ENEMA 7-19 GM/118ML RE ENEM
1.0000 | ENEMA | Freq: Once | RECTAL | Status: DC
  Filled 2011-07-04: qty 1

## 2011-07-04 NOTE — ED Notes (Signed)
Attempted to give pt fleet enema, pt refused and reported had a bowel movement already. Pt states "i think i'm good with the bathroom but my stomach still hurts though". P.A made aware and to follow up, will continue to monitor pt.

## 2011-07-04 NOTE — ED Notes (Signed)
Pt. Has not moved his bowels in 3 days, believes that a warming oil got into his mouth while smoking,   Abdominal pain, abdomen is soft pt. Does guard it.    Denies any blood in his bowels

## 2011-07-05 LAB — URINALYSIS, ROUTINE W REFLEX MICROSCOPIC
Glucose, UA: 500 mg/dL — AB
Hgb urine dipstick: NEGATIVE
Ketones, ur: NEGATIVE mg/dL
Leukocytes, UA: NEGATIVE
Protein, ur: NEGATIVE mg/dL
pH: 7 (ref 5.0–8.0)

## 2011-07-05 MED ORDER — DOCUSATE SODIUM 100 MG PO CAPS: 100.0000 mg | ORAL_CAPSULE | Freq: Two times a day (BID) | ORAL | Status: AC

## 2011-07-05 MED ORDER — POLYETHYLENE GLYCOL 3350 17 G PO PACK: 17.0000 g | PACK | Freq: Every day | ORAL | Status: AC

## 2011-07-05 NOTE — ED Notes (Signed)
Pt denies any further questions upon discharge. 

## 2011-07-05 NOTE — ED Provider Notes (Signed)
History     CSN: [id number]  Arrival date & time 07/04/11  1843   First MD Initiated Contact with Patient 07/04/11 2049      Chief Complaint  Patient presents with  . Constipation    (Consider location/radiation/quality/duration/timing/severity/associated sxs/prior treatment) HPI Patient is a 61 year old male with past medical history of diabetes, hypertension, hepatitis C and no prior abdominal surgeries who presents to the emergency department with a chief complaint of abdominal pain and constipation. Has not had a bowel movement in the last 3 days. Abdominal pain has been present for the same time as constipation. Pain is located in the periumbilical region, nonradiating, not migratory, waxing and waning, described as sharp, moderate to severe in intensity. There is associated nausea and vomiting with several episodes of clear emesis earlier today. The patient denies any fever, chills, chest pain, shortness of breath, back pain. Denies dysuria, hematuria, penile discharge or testicular pain. Nothing makes the symptoms better or worse. There has been no prior treatment.  Past Medical History  Diagnosis Date  . Diabetes mellitus   . Hypertension   . Coronary artery disease   . Hepatitis C   . High cholesterol     Past Surgical History  Procedure Date  . Joint replacement   . Esophagogastroduodenoscopy 02/09/2011    Procedure: ESOPHAGOGASTRODUODENOSCOPY (EGD);  Surgeon: Barrie Folk, MD;  Location: [facility] ENDOSCOPY;  Service: Endoscopy;  Laterality: N/A;    No family history on file.  History  Substance Use Topics  . Smoking status: Current Everyday Smoker    Types: Cigars  . Smokeless tobacco: Not on file  . Alcohol Use:      former drinker      Review of Systems 10 systems reviewed and are negative for acute change except as noted in the HPI.  Allergies  Mobic  Home Medications   Current Outpatient Rx  Name Route Sig Dispense Refill  . ATORVASTATIN CALCIUM 10 MG  PO TABS Oral Take 10 mg by mouth daily.      [name] BISOPROLOL-HYDROCHLOROTHIAZIDE 2.5-6.25 MG PO TABS Oral Take 1 tablet by mouth daily.    [name] GLIPIZIDE ER 5 MG PO TB24 Oral Take 5 mg by mouth daily.      [name] METFORMIN HCL ER 500 MG PO TB24 Oral Take 500 mg by mouth 2 (two) times daily.     [name] PIOGLITAZONE HCL 30 MG PO TABS Oral Take 30 mg by mouth daily.    . SERTRALINE HCL 50 MG PO TABS Oral Take 50 mg by mouth daily.        BP 161/77  Pulse 77  Temp(Src) 98.3 F (36.8 C) (Oral)  Resp 18  SpO2 100%  Physical Exam Vital signs are reviewed and although initially significant for mild tachycardia, are within normal limits at the time of my assessment. Nursing notes reviewed. Patient is well-developed, well-nourished, mildly uncomfortable appearing. Head is normocephalic and atraumatic. Oral mucosa moist. Pupils are equal, round, reactive to light. Conjunctiva are normal. Neck is supple. Heart is regular rate and rhythm. No murmur heard. Normal respiratory effort and excursion. Lungs are clear to auscultation bilaterally. Abdomen is soft, nondistended. Bowel sounds are normal in all 4 quadrants. There is mild tenderness to palpation isolated to the periumbilical region. Rectal exam with normal anal tone. No significant tenderness on examination. Firm stool is palpable in the vault. No gross blood seen. Calves supple and nontender. Patient is alert and oriented x3. Normal gait. Speech is clear. Moves all  extremities well. Skin is warm and dry without any obvious rash or lesion. ED Course  Fecal disimpaction Date/Time: 07/04/2011 11:00 PM Performed by: Lorenz Coaster JUSTICE Authorized by: Lorenz Coaster JUSTICE Consent: Verbal consent obtained. Risks and benefits: risks, benefits and alternatives were discussed Consent given by: patient Patient understanding: patient states understanding of the procedure being performed Patient identity confirmed: verbally with patient Time out:  Immediately prior to procedure a "time out" was called to verify the correct patient, procedure, equipment, support staff and site/side marked as required. Local anesthesia used: no Patient sedated: no Patient tolerance: Patient tolerated the procedure well with no immediate complications. Comments: Minimal stool retrieved from the vault.   (including critical care time)  Labs Reviewed  CBC - Abnormal; Notable for the following:    RDW 15.6 (*)    All other components within normal limits  COMPREHENSIVE METABOLIC PANEL - Abnormal; Notable for the following:    Sodium 132 (*)    Glucose, Bld 183 (*)    AST 49 (*)    ALT 63 (*)    All other components within normal limits  LIPASE, BLOOD - Abnormal; Notable for the following:    Lipase 84 (*)    All other components within normal limits  URINALYSIS, ROUTINE W REFLEX MICROSCOPIC   Dg Abd Acute W/chest  07/04/2011  *RADIOLOGY REPORT*  Clinical Data: Supra umbilical abdominal pain, evaluate for obstruction  ACUTE ABDOMEN SERIES (ABDOMEN 2 VIEW & CHEST 1 VIEW)  Comparison: Chest radiograph - 02/19/2011; 10/13/2010; abdominal CT - 05/09/2010  Findings:  Grossly unchanged cardiac silhouette and mediastinal contours. There is persistent mild elevation of the right hemidiaphragm.  No focal parenchymal opacities.  No definite pleural effusion or pneumothorax.  Nonobstructive bowel gas pattern.  No pneumoperitoneum, pneumatosis or portal venous gas.  A linear radiopaque structure overlies the right hemiabdomen while additional radiopaque structure overlies the location of the gastric fundus.  Lumbar spine degenerative change.  Transitional anatomy is suspected.  IMPRESSION: 1.  No acute cardiopulmonary disease. 2.  Nonobstructive bowel gas pattern. 3.  Linear radiopaque foreign bodies overlying the abdomen are indeterminate in location, possibly external to the patient.  Original Report Authenticated By: Waynard Reeds, M.D.     Dx 1:  Constipation   MDM  Labs reviewed:  U/a without evidence of UTI. CMP with slight hyponatremia, poss related to hyperglycemia. Slight transaminitis, c/w Hep C hx. Elevated lipase- review of prior values demonstrates elevation in the past. Symptoms not c/w pancreatitis and I doubt this is contributing to his pain today. CBC unremarkable.  I personally viewed the acute abd series images. No evidence of obstruction.  Disimpaction removed small amount of stool. Pt subsequently had a large bowel movement. Has had no continued vomiting in ED. Repeat abd exam with only mild tenderness. He will be discharged home with bowel regimen instructions. Return precautions were discussed.       Shaaron Adler, [place] 07/05/11 [phone]

## 2011-07-05 NOTE — ED Provider Notes (Signed)
Medical screening examination/treatment/procedure(s) were conducted as a shared visit with non-physician practitioner(s) and myself.  I personally evaluated the patient during the encounter.  History and physical consistent with constipation. Patient had a bowel movement after digital disimpaction  Donnetta Hutching, MD 07/05/11 2241

## 2011-07-05 NOTE — Discharge Instructions (Signed)
Constipation in Adults Constipation is having fewer than 2 bowel movements per week. Usually, the stools are hard. As we grow older, constipation is more common. If you try to fix constipation with laxatives, the problem may get worse. This is because laxatives taken over a long period of time make the colon muscles weaker. A low-fiber diet, not taking in enough fluids, and taking some medicines may make these problems worse. MEDICATIONS THAT MAY CAUSE CONSTIPATION  Water pills (diuretics).   Calcium channel blockers (used to control blood pressure and for the heart).   Certain pain medicines (narcotics).   Anticholinergics.   Anti-inflammatory agents.   Antacids that contain aluminum.  DISEASES THAT CONTRIBUTE TO CONSTIPATION  Diabetes.   Parkinson's disease.   Dementia.   Stroke.   Depression.   Illnesses that cause problems with salt and water metabolism.  HOME CARE INSTRUCTIONS   Constipation is usually best cared for without medicines. Increasing dietary fiber and eating more fruits and vegetables is the best way to manage constipation.   Slowly increase fiber intake to 25 to 38 grams per day. Whole grains, fruits, vegetables, and legumes are good sources of fiber. A dietitian can further help you incorporate high-fiber foods into your diet.   Drink enough water and fluids to keep your urine clear or pale yellow.   A fiber supplement may be added to your diet if you cannot get enough fiber from foods.   Increasing your activities also helps improve regularity.   Suppositories, as suggested by your caregiver, will also help. If you are using antacids, such as aluminum or calcium containing products, it will be helpful to switch to products containing magnesium if your caregiver says it is okay.   If you have been given a liquid injection (enema) today, this is only a temporary measure. It should not be relied on for treatment of longstanding (chronic) constipation.    Stronger measures, such as magnesium sulfate, should be avoided if possible. This may cause uncontrollable diarrhea. Using magnesium sulfate may not allow you time to make it to the bathroom.  SEEK IMMEDIATE MEDICAL CARE IF:   There is bright red blood in the stool.   The constipation stays for more than 4 days.   There is belly (abdominal) or rectal pain.   You do not seem to be getting better.   You have any questions or concerns.  MAKE SURE YOU:   Understand these instructions.   Will watch your condition.   Will get help right away if you are not doing well or get worse.  Document Released: 11/18/2003 Document Revised: 02/08/2011 Document Reviewed: 01/23/2011 [facility] Patient Information 2012 [place], [place].         Polyethylene Glycol powder What is this medicine? POLYETHYLENE GLYCOL 3350 (pol ee ETH i leen; GLYE col) powder is a laxative used to treat constipation. It increases the amount of water in the stool. Bowel movements become easier and more frequent. This medicine may be used for other purposes; ask your health care provider or pharmacist if you have questions. What should I tell my health care provider before I take this medicine? They need to know if you have any of these conditions: -a history of blockage of the stomach or intestine -current abdomen distension or pain -difficulty swallowing -diverticulitis, ulcerative colitis, or other chronic bowel disease -phenylketonuria -an unusual or allergic reaction to polyethylene glycol, other medicines, dyes, or preservatives -pregnant or trying to get pregnant -breast-feeding How should I use this  medicine? Take this medicine by mouth. The bottle has a measuring cap that is marked with a line. Pour the powder into the cap up to the marked line (the dose is about 1 heaping tablespoon). Add the powder in the cap to a full glass (4 to 8 ounces or 120 to 240 ml) of water, juice, soda, coffee or tea. Mix the  powder well. Drink the solution. Take exactly as directed. Do not take your medicine more often than directed. Talk to your pediatrician regarding the use of this medicine in children. Special care may be needed. Overdosage: If you think you have taken too much of this medicine contact a poison control center or emergency room at once. NOTE: This medicine is only for you. Do not share this medicine with others. What if I miss a dose? If you miss a dose, take it as soon as you can. If it is almost time for your next dose, take only that dose. Do not take double or extra doses. What may interact with this medicine? Interactions are not expected. This list may not describe all possible interactions. Give your health care provider a list of all the medicines, herbs, non-prescription drugs, or dietary supplements you use. Also tell them if you smoke, drink alcohol, or use illegal drugs. Some items may interact with your medicine. What should I watch for while using this medicine? Do not use for more than 2 weeks without advice from your doctor or health care professional. It can take 2 to 4 days to have a bowel movement and to experience improvement in constipation. See your health care professional for any changes in bowel habits, including constipation, that are severe or last longer than three weeks. Always take this medicine with plenty of water. What side effects may I notice from receiving this medicine? Side effects that you should report to your doctor or health care professional as soon as possible: -diarrhea -difficulty breathing -itching of the skin, hives, or skin rash -severe bloating, pain, or distension of the stomach -vomiting Side effects that usually do not require medical attention (report to your doctor or health care professional if they continue or are bothersome): -bloating or gas -lower abdominal discomfort or cramps -nausea This list may not describe all possible side  effects. Call your doctor for medical advice about side effects. You may report side effects to FDA at 1-800-FDA-1088. Where should I keep my medicine? Keep out of the reach of children. Store between 15 and 30 degrees C (59 and 86 degrees F). Throw away any unused medicine after the expiration date. NOTE: This sheet is a summary. It may not cover all possible information. If you have questions about this medicine, talk to your doctor, pharmacist, or health care provider.  2012, Elsevier/[employer] Standard. (09/22/2007 4:50:45 PM)          Docusate capsules What is this medicine? DOCUSATE (doc CUE sayt) is stool softener. It helps prevent constipation and straining or discomfort associated with hard or dry stools. This medicine may be used for other purposes; ask your health care provider or pharmacist if you have questions. What should I tell my health care provider before I take this medicine? They need to know if you have any of these conditions: -nausea or vomiting -severe constipation -stomach pain -sudden change in bowel habit lasting more than 2 weeks -an unusual or allergic reaction to docusate, other medicines, foods, dyes, or preservatives -pregnant or trying to get pregnant -breast-feeding How should I  use this medicine? Take this medicine by mouth with a glass of water. Follow the directions on the label. Take your doses at regular intervals. Do not take your medicine more often than directed. Talk to your pediatrician regarding the use of this medicine in children. While this medicine may be prescribed for children as young as 2 years for selected conditions, precautions do apply. Overdosage: If you think you have taken too much of this medicine contact a poison control center or emergency room at once. NOTE: This medicine is only for you. Do not share this medicine with others. What if I miss a dose? If you miss a dose, take it as soon as you can. If it is almost time for  your next dose, take only that dose. Do not take double or extra doses. What may interact with this medicine? -mineral oil This list may not describe all possible interactions. Give your health care provider a list of all the medicines, herbs, non-prescription drugs, or dietary supplements you use. Also tell them if you smoke, drink alcohol, or use illegal drugs. Some items may interact with your medicine. What should I watch for while using this medicine? Do not use for more than one week without advice from your doctor or health care professional. If your constipation returns, check with your doctor or health care professional. Drink plenty of water while taking this medicine. Drinking water helps decrease constipation. Stop using this medicine and contact your doctor or health care professional if you experience any rectal bleeding or do not have a bowel movement after use. These could be signs of a more serious condition. What side effects may I notice from receiving this medicine? Side effects that you should report to your doctor or health care professional as soon as possible: -allergic reactions like skin rash, itching or hives, swelling of the face, lips, or tongue Side effects that usually do not require medical attention (report to your doctor or health care professional if they continue or are bothersome): -diarrhea -stomach cramps -throat irritation This list may not describe all possible side effects. Call your doctor for medical advice about side effects. You may report side effects to FDA at 1-800-FDA-1088. Where should I keep my medicine? Keep out of the reach of children. Store at room temperature between 15 and 30 degrees C (59 and 86 degrees F). Throw away any unused medicine after the expiration date. NOTE: This sheet is a summary. It may not cover all possible information. If you have questions about this medicine, talk to your doctor, pharmacist, or health care provider.   2012, Elsevier/Gold Standard. (06/12/2007 3:56:49 PM)

## 2011-07-16 ENCOUNTER — Emergency Department (HOSPITAL_COMMUNITY)
Admission: EM | Admit: 2011-07-16 | Discharge: 2011-07-17 | Disposition: A | Discharge status: Home / Self Care | Payer: Medicaid Other | Attending: Emergency Medicine | Admitting: Emergency Medicine

## 2011-07-16 ENCOUNTER — Encounter (HOSPITAL_COMMUNITY): Payer: Self-pay | Admitting: Emergency Medicine

## 2011-07-16 ENCOUNTER — Emergency Department (HOSPITAL_COMMUNITY): Payer: Medicaid Other

## 2011-07-16 DIAGNOSIS — I10 Essential (primary) hypertension: Secondary | ICD-10-CM | POA: Insufficient documentation

## 2011-07-16 DIAGNOSIS — M129 Arthropathy, unspecified: Secondary | ICD-10-CM | POA: Insufficient documentation

## 2011-07-16 DIAGNOSIS — Z79899 Other long term (current) drug therapy: Secondary | ICD-10-CM | POA: Insufficient documentation

## 2011-07-16 DIAGNOSIS — Z7982 Long term (current) use of aspirin: Secondary | ICD-10-CM | POA: Insufficient documentation

## 2011-07-16 DIAGNOSIS — E119 Type 2 diabetes mellitus without complications: Secondary | ICD-10-CM | POA: Insufficient documentation

## 2011-07-16 DIAGNOSIS — R109 Unspecified abdominal pain: Secondary | ICD-10-CM | POA: Insufficient documentation

## 2011-07-16 DIAGNOSIS — R112 Nausea with vomiting, unspecified: Secondary | ICD-10-CM | POA: Insufficient documentation

## 2011-07-16 DIAGNOSIS — I2510 Atherosclerotic heart disease of native coronary artery without angina pectoris: Secondary | ICD-10-CM | POA: Insufficient documentation

## 2011-07-16 DIAGNOSIS — E7800 Pure hypercholesterolemia, unspecified: Secondary | ICD-10-CM | POA: Insufficient documentation

## 2011-07-16 DIAGNOSIS — Z8619 Personal history of other infectious and parasitic diseases: Secondary | ICD-10-CM | POA: Insufficient documentation

## 2011-07-16 HISTORY — DX: Unspecified osteoarthritis, unspecified site: M19.90

## 2011-07-16 LAB — URINALYSIS, ROUTINE W REFLEX MICROSCOPIC
Glucose, UA: NEGATIVE mg/dL
Hgb urine dipstick: NEGATIVE
Ketones, ur: NEGATIVE mg/dL
Protein, ur: NEGATIVE mg/dL

## 2011-07-16 LAB — COMPREHENSIVE METABOLIC PANEL
AST: 36 U/L (ref 0–37)
CO2: 27 mEq/L (ref 19–32)
Chloride: 100 mEq/L (ref 96–112)
Creatinine, Ser: 0.75 mg/dL (ref 0.50–1.35)
GFR calc non Af Amer: >90 mL/min (ref >90)
Glucose, Bld: 161 mg/dL — ABNORMAL HIGH (ref 70–99)
Total Bilirubin: 0.3 mg/dL (ref 0.3–1.2)

## 2011-07-16 LAB — CBC
HCT: 44.6 % (ref 39.0–52.0)
Hemoglobin: 15.3 g/dL (ref 13.0–17.0)
MCHC: 34.3 g/dL (ref 30.0–36.0)
MCV: 81.2 fL (ref 78.0–100.0)
WBC: 7.8 10*3/uL (ref 4.0–10.5)

## 2011-07-16 LAB — DIFFERENTIAL
Eosinophils Relative: 2 % (ref 0–5)
Lymphocytes Relative: 45 % (ref 12–46)
Monocytes Absolute: 0.3 10*3/uL (ref 0.1–1.0)
Monocytes Relative: 4 % (ref 3–12)
Neutro Abs: 3.8 10*3/uL (ref 1.7–7.7)

## 2011-07-16 MED ORDER — ONDANSETRON 8 MG PO TBDP
8.0000 mg | ORAL_TABLET | Freq: Once | ORAL | Status: AC
  Administered 2011-07-16: 8 mg via ORAL
  Filled 2011-07-16: qty 1

## 2011-07-16 MED ORDER — ONDANSETRON HCL 4 MG/2ML IJ SOLN
4.0000 mg | Freq: Once | INTRAMUSCULAR | Status: AC
  Administered 2011-07-16: 4 mg via INTRAVENOUS
  Filled 2011-07-16: qty 2

## 2011-07-16 MED ORDER — SODIUM CHLORIDE 0.9 % IV BOLUS (SEPSIS)
1000.0000 mL | Freq: Once | INTRAVENOUS | Status: AC
  Administered 2011-07-16: 1000 mL via INTRAVENOUS

## 2011-07-16 MED ORDER — MORPHINE SULFATE 4 MG/ML IJ SOLN
4.0000 mg | Freq: Once | INTRAMUSCULAR | Status: AC
  Administered 2011-07-16: 4 mg via INTRAVENOUS
  Filled 2011-07-16 (×2): qty 1

## 2011-07-16 NOTE — ED Notes (Signed)
Was d/c'd from [facility] 1 and a half weeks ago, was there for constipation, has severe pain in abd- mid epigastric area started yesterday. Has been constipated, last BM yesterday. States vomitus appears brown.

## 2011-07-16 NOTE — ED Provider Notes (Signed)
History     CSN: [id number]  Arrival date & time 07/16/11  1747   First MD Initiated Contact with Patient 07/16/11 2314      Chief Complaint  Patient presents with  . Abdominal Pain  . Emesis    (Consider location/radiation/quality/duration/timing/severity/associated sxs/prior treatment) HPI Comments: 61 year old male with a history of diabetes, hypertension, hepatitis C who presents with mid abdominal pain. He states this is similar to the pain for which he was seen 10 days ago in the emergency department. The pain is in the periumbilical and epigastric areas, radiates into his epigastrium, is poorly described quality but fluctuates in intensity throughout the day. It is associated with multiple episodes of nausea and vomiting over the last 2 days, one bowel movement yesterday but no vomiting today, he admits to passing flatus throughout the day but no movements. The symptoms are similar to the way that they felt to 10 days ago he states it is similar symptoms in December he was admitted to the hospital and evaluated for abdominal pain and an upper GI bleed. He has been told in the past he's had pancreatitis but has not drank alcohol in many many years. He is using excessive amounts of NSAIDs, primarily meloxicam for his arthritis. According to his last visit he was disimpacted and then had a large bowel movement and was placed on a laxative. While he states this has not been working he has had large bowel movement yesterday.  Patient is a 61 y.o. male presenting with abdominal pain and vomiting. The history is provided by the patient and medical records.  Abdominal Pain The primary symptoms of the illness include abdominal pain and vomiting.  Emesis  Associated symptoms include abdominal pain.    Past Medical History  Diagnosis Date  . Diabetes mellitus   . Hypertension   . Coronary artery disease   . Hepatitis C   . High cholesterol   . Arthritis     Past Surgical History    Procedure Date  . Joint replacement   . Esophagogastroduodenoscopy 02/09/2011    Procedure: ESOPHAGOGASTRODUODENOSCOPY (EGD);  Surgeon: Barrie Folk, MD;  Location: [facility] ENDOSCOPY;  Service: Endoscopy;  Laterality: N/A;    History reviewed. No pertinent family history.  History  Substance Use Topics  . Smoking status: Current Everyday Smoker    Types: Cigars  . Smokeless tobacco: Not on file  . 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  All other systems reviewed and are negative.    Allergies  Mobic  Home Medications   Current Outpatient Rx  Name Route Sig Dispense Refill  . ASPIRIN EC 81 MG PO TBEC Oral Take 81 mg by mouth daily.    . ATORVASTATIN CALCIUM 10 MG PO TABS Oral Take 10 mg by mouth daily.      [name] BISOPROLOL-HYDROCHLOROTHIAZIDE 2.5-6.25 MG PO TABS Oral Take 1 tablet by mouth daily.    [name] GLIPIZIDE ER 5 MG PO TB24 Oral Take 5 mg by mouth daily.      [name] METFORMIN HCL ER 500 MG PO TB24 Oral Take 500 mg by mouth 2 (two) times daily.     . ADULT MULTIVITAMIN W/MINERALS CH Oral Take 1 tablet by mouth daily.    [name] PIOGLITAZONE HCL 30 MG PO TABS Oral Take 30 mg by mouth daily.    . SERTRALINE HCL 50 MG  PO TABS Oral Take 50 mg by mouth daily.      [name] FAMOTIDINE 20 MG PO TABS Oral Take 1 tablet (20 mg total) by mouth 2 (two) times daily. 30 tablet 0  . PROMETHAZINE HCL 25 MG PO TABS Oral Take 1 tablet (25 mg total) by mouth every 6 (six) hours as needed for nausea. 12 tablet 0    BP 150/80  Pulse 70  Temp(Src) 98.7 F (37.1 C) (Oral)  Resp 16  SpO2 100%  Physical Exam  Nursing note and vitals reviewed. Constitutional: He appears well-developed and well-nourished. No distress.  HENT:  Head: Normocephalic and atraumatic.  Mouth/Throat: Oropharynx is clear and moist. No oropharyngeal exudate.  Eyes: Conjunctivae and EOM are normal. Pupils are equal, round, and reactive to  light. Right eye exhibits no discharge. Left eye exhibits no discharge. No scleral icterus.  Neck: Normal range of motion. Neck supple. No JVD present. No thyromegaly present.  Cardiovascular: Normal rate, regular rhythm, normal heart sounds and intact distal pulses.  Exam reveals no gallop and no friction rub.   No murmur heard. Pulmonary/Chest: Effort normal and breath sounds normal. No respiratory distress. He has no wheezes. He has no rales.  Abdominal: Soft. Bowel sounds are normal. He exhibits no distension and no mass. There is tenderness ( Mild epigastric and supraumbilical tenderness, no guarding, no masses, no pain at McBurney's point).       Non-peritoneal  Musculoskeletal: Normal range of motion. He exhibits no edema and no tenderness.  Lymphadenopathy:    He has no cervical adenopathy.  Neurological: He is alert. Coordination normal.  Skin: Skin is warm and dry. No rash noted. No erythema.  Psychiatric: He has a normal mood and affect. His behavior is normal.    ED Course  Procedures (including critical care time)  ED ECG REPORT   Date: 07/17/2011   Rate: 63  Rhythm: normal sinus rhythm  QRS Axis: normal  Intervals: normal  ST/T Wave abnormalities: normal  Conduction Disutrbances:none  Narrative Interpretation:   Old EKG Reviewed: unchanged compared with 02/09/2011, only rate is slower.   Labs Reviewed  CBC - Abnormal; Notable for the following:    RDW 15.6 (*)    All other components within normal limits  COMPREHENSIVE METABOLIC PANEL - Abnormal; Notable for the following:    Glucose, Bld 161 (*)    All other components within normal limits  LIPASE, BLOOD - Abnormal; Notable for the following:    Lipase 65 (*)    All other components within normal limits  DIFFERENTIAL  URINALYSIS, ROUTINE W REFLEX MICROSCOPIC  POCT I-STAT TROPONIN I   Ct Abdomen Pelvis W Contrast  07/17/2011  *RADIOLOGY REPORT*  Clinical Data: Severe abdominal and mid epigastric pain since  yesterday.  The discharge from bone and one half weeks ago for constipation.  Vomiting.  CT ABDOMEN AND PELVIS WITH CONTRAST  Technique:  Multidetector CT imaging of the abdomen and pelvis was performed following the standard protocol during bolus administration of intravenous contrast.  Contrast: OMNIPAQUE IOHEXOL 300 MG/ML  SOLN  Comparison: 05/09/2010 at [facility] imaging  Findings: The lung bases are clear.  Calcified granuloma in the dome of the liver.  Liver parenchyma is otherwise homogeneous.  The gallbladder, spleen, pancreas, adrenal glands, kidneys, and retroperitoneal lymph nodes are unremarkable.  Calcification throughout the abdominal aorta and branch vessels without aneurysm. The stomach, small bowel, and colon are not abnormally distended. Diffusely stool filled colon without wall thickening.  No free  air or free fluid in the abdomen.  Incidental note of a small intramuscular lipoma in the right posterior flank region.  Pelvis:  Prostate gland is not enlarged.  No bladder wall thickening.  Scattered diverticula in the sigmoid colon without inflammatory change.  The appendix is normal.  No free or loculated pelvic fluid collections.  Normal alignment of the lumbar vertebrae.  IMPRESSION: No acute process demonstrated in the abdomen or pelvis.  Original Report Authenticated By: Marlon Pel, M.D.   Dg Abd Acute W/chest  07/16/2011  *RADIOLOGY REPORT*  Clinical Data: Abdominal pain  ACUTE ABDOMEN SERIES (ABDOMEN 2 VIEW & CHEST 1 VIEW)  Comparison: 07/04/2011  Findings: Cardiomediastinal silhouette is within normal limits. The lungs are clear. No pleural effusion.  No pneumothorax.  No acute osseous abnormality.  No free air beneath the diaphragms.  No bowel dilatation or air-fluid level.  No abnormal calcific opacity. Vascular calcifications are noted.  Epigastric clips are present. No acute osseous abnormality.  IMPRESSION: No acute cardiopulmonary process or evidence for bowel  obstruction.  Original Report Authenticated By: Harrel Lemon, M.D.     1. Abdominal  pain, other specified site   2. Nausea and vomiting       MDM  Overall the patient appears well, has normal vital signs including a regular respiratory rate at 18 breaths per minute, normal pulses, soft abdomen with minimal epigastric and supraumbilical tenderness. I do not palpate any pulsating masses, he has no guarding and is non-peritoneal. He's had an x-ray today showing no significant findings, no signs of bowel traction and lab work which is overall unremarkable. I doubt that is minimally elevated lipase would be consistent with a case of pancreatitis especially since it was higher at his last visit and he improved after having a bowel movement. We'll pursue further evaluation with IV fluids, medications and a CT scan to confirm no other signs of surgical pathology including AAA.   CT scan reviewed, no signs of abdominal aortic aneurysm or other surgical abnormality. Vital signs remain normal, patient has improved with intravenous fluids and medications and has tolerated oral challenge. I have explained the patient his results and encouraged him to followup with his gastroenterologist Dr. Elnoria Howard who he has seen in the past. I do not feel that the patient has an emergent surgical or other significant medical condition causing his symptoms this evening they may be related to gastroparesis related to his diabetes. Patient has expressed his understanding of his results and need for followup.     Discharge Prescriptions include:  phenergan  Vida Roller, MD 07/17/11 [phone]

## 2011-07-17 MED ORDER — IOHEXOL 300 MG/ML  SOLN
100.0000 mL | Freq: Once | INTRAMUSCULAR | Status: AC | PRN
  Administered 2011-07-17: 100 mL via INTRAVENOUS

## 2011-07-17 MED ORDER — SODIUM CHLORIDE 0.9 % IV BOLUS (SEPSIS)
500.0000 mL | Freq: Once | INTRAVENOUS | Status: AC
  Administered 2011-07-17: 500 mL via INTRAVENOUS

## 2011-07-17 MED ORDER — FAMOTIDINE 20 MG PO TABS: 20.0000 mg | ORAL_TABLET | Freq: Two times a day (BID) | ORAL | Status: DC

## 2011-07-17 MED ORDER — PROMETHAZINE HCL 25 MG/ML IJ SOLN
25.0000 mg | Freq: Once | INTRAMUSCULAR | Status: AC
  Administered 2011-07-17: 25 mg via INTRAVENOUS
  Filled 2011-07-17: qty 1

## 2011-07-17 MED ORDER — PANTOPRAZOLE SODIUM 40 MG IV SOLR
40.0000 mg | Freq: Once | INTRAVENOUS | Status: AC
  Administered 2011-07-17: 40 mg via INTRAVENOUS
  Filled 2011-07-17: qty 40

## 2011-07-17 MED ORDER — PROMETHAZINE HCL 25 MG PO TABS: 25.0000 mg | ORAL_TABLET | Freq: Four times a day (QID) | ORAL | Status: DC | PRN

## 2011-07-17 NOTE — ED Notes (Signed)
Patient given discharge instructions, information, prescriptions, and diet order. Patient states that they adequately understand discharge information given and to return to ED if symptoms return or worsen.     

## 2011-07-17 NOTE — ED Notes (Signed)
Pt informed about fluid challenge. Sts he has a problem with getting ride home. Informed that we can provide him with a bus pass.

## 2011-07-17 NOTE — ED Notes (Signed)
Patient transported to CT 

## 2011-07-17 NOTE — Discharge Instructions (Signed)
Your CT scan and lab work have been overall unremarkable, please do not take the pain medications that you have taken in the past as they may be causing worsening symptoms. Your pain and nausea may also be related to the fact that your diabetes has reduced your stomach's ability to empty. I would encourage you to call your gastroenterologist today for a followup this week for further testing including a possible gastric emptying study if it has not already been done. Please take Phenergan for nausea.  You have been diagnosed with undifferentiated abdominal pain.  Abdominal pain can be caused by many things. Your caregiver evaluates the seriousness of your pain by an examination and possibly blood or urine tests and imaging (CT scan, x-rays, ultrasound). Many cases can be observed and treated at home after initial evaluation in the emergency department. Even though you are being discharged home, abdominal pain can be unpredictable. Therefore, you need a repeat exam if your pain does not resolve, returns, or worsens. Most patient's with abdominal pain do not need to be admitted to the hospital or have surgery, but serious problems like appendicitis and gallbladder attacks can start out as nonspecific pain. Many abdominal conditions cannot be diagnosed in 1 visit, so followup evaluations are very important.  Seek immediate medical attention if:  *The pain does not go away or becomes severe. *Temperature above 101 develops *Repeated vomiting occurs(multiple episodes) *The pain becomes localized to portions of the abdomen. The right side could possibly be appendicitis. In an adult, the left lower portion of the abdomen could be colitis or diverticulitis. *Blood is being passed in stools or vomit *Return also if you develop chest pain, difficulty breathing, dizziness or fainting, or become confused poorly responsive or inconsolable (young children).     If you do not have a physician, you should reference  the below phone numbers and call in the morning to establish follow up care.  RESOURCE GUIDE  Dental Problems  Patients with Medicaid: [facility] [address]                                           [id number] [address] [employer] Phone:  [phone]                                                  Phone:  [phone]  If unable to pay or uninsured, contact:  [facility] or [facility]. to become qualified for the adult dental clinic.  Chronic Pain Problems Contact [facility]  [phone] Patients need to be referred by their primary care doctor.  Insufficient Money for Medicine Contact [employer]:  call "211" or [facility] 613-475-5200.  No Primary Care Doctor Call [facility]  832-416-0663 Other agencies that provide inexpensive medical care    [facility]  [phone]    [facility]  [phone]    [facility]  [phone]    [facility]  [phone]    [facility]  [phone])684-3093    [facility]  [phone]  Psychological Services  [facility]  [phone] [facility]  [phone] [facility]   [phone] (emergency services ([phone])  Substance Abuse Resources Alcohol and Drug Services  (878) 342-3756 [facility] (775)186-3093 [facility] 604-298-0352 Floyd[facility] Flock (814) [phone] Residential & Outpatient Substance Abuse Program  ([phone] [phone]  Abuse/Neglect [facility] Child Abuse Hotline 847-140-3173 [facility] Child Abuse Hotline 260-407-2188 (After Hours)  Emergency Shelter [facility] 701-418-9286  M[facility] Homes [facility] [place] [facility] 740-244-5898 [facility] 2197244084  MRSA Hotline #:   838-874-4115    [facility] Resources  [facility]     [employer]                          [facility] Dept. [address] [place]                       [address]      [address]  [place] Phone:  [phone]                                   Phone:  [phone]                 Phone:  [phone]  [facility] Phone:  [phone]  [facility] Child Abuse Hotline 7542398719 [phone] (After Hours)

## 2011-07-17 NOTE — ED Notes (Signed)
Pt reports chronic hx of abd pain, pt here tonight w/ progressively worse generalized abd pain - states he has recently been on pain medications which caused him to have severe constipation, pt was seen recently at [facility] for this. Pt admits to x2 days of n/v - report LNBM was yesterday. Pt also states he has been told previously that he has pancreatitis however has never been "a drinker." Pt in no acute distress at present, resting comfortably in bed.

## 2012-01-04 ENCOUNTER — Encounter (HOSPITAL_COMMUNITY): Payer: Self-pay | Admitting: Adult Health

## 2012-01-04 ENCOUNTER — Emergency Department (HOSPITAL_COMMUNITY)
Admission: EM | Admit: 2012-01-04 | Discharge: 2012-01-04 | Disposition: A | Discharge status: Home / Self Care | Payer: Self-pay | Attending: Emergency Medicine | Admitting: Emergency Medicine

## 2012-01-04 DIAGNOSIS — I10 Essential (primary) hypertension: Secondary | ICD-10-CM | POA: Insufficient documentation

## 2012-01-04 DIAGNOSIS — I2510 Atherosclerotic heart disease of native coronary artery without angina pectoris: Secondary | ICD-10-CM | POA: Insufficient documentation

## 2012-01-04 DIAGNOSIS — Z8619 Personal history of other infectious and parasitic diseases: Secondary | ICD-10-CM | POA: Insufficient documentation

## 2012-01-04 DIAGNOSIS — L0291 Cutaneous abscess, unspecified: Secondary | ICD-10-CM

## 2012-01-04 DIAGNOSIS — Z7982 Long term (current) use of aspirin: Secondary | ICD-10-CM | POA: Insufficient documentation

## 2012-01-04 DIAGNOSIS — Z79899 Other long term (current) drug therapy: Secondary | ICD-10-CM | POA: Insufficient documentation

## 2012-01-04 DIAGNOSIS — E7800 Pure hypercholesterolemia, unspecified: Secondary | ICD-10-CM | POA: Insufficient documentation

## 2012-01-04 DIAGNOSIS — E119 Type 2 diabetes mellitus without complications: Secondary | ICD-10-CM | POA: Insufficient documentation

## 2012-01-04 DIAGNOSIS — IMO0002 Reserved for concepts with insufficient information to code with codable children: Secondary | ICD-10-CM | POA: Insufficient documentation

## 2012-01-04 DIAGNOSIS — F17200 Nicotine dependence, unspecified, uncomplicated: Secondary | ICD-10-CM | POA: Insufficient documentation

## 2012-01-04 DIAGNOSIS — L0390 Cellulitis, unspecified: Secondary | ICD-10-CM

## 2012-01-04 LAB — CBC
Hemoglobin: 13.7 g/dL (ref 13.0–17.0)
MCH: 29.3 pg (ref 26.0–34.0)
MCV: 85 fL (ref 78.0–100.0)
RBC: 4.68 MIL/uL (ref 4.22–5.81)

## 2012-01-04 LAB — POCT I-STAT, CHEM 8
BUN: 16 mg/dL (ref 6–23)
Calcium, Ion: 1.16 mmol/L (ref 1.13–1.30)
Creatinine, Ser: 0.9 mg/dL (ref 0.50–1.35)
TCO2: 26 mmol/L (ref 0–100)

## 2012-01-04 MED ORDER — ONDANSETRON HCL 4 MG/2ML IJ SOLN
4.0000 mg | Freq: Once | INTRAMUSCULAR | Status: AC
  Administered 2012-01-04: 4 mg via INTRAVENOUS
  Filled 2012-01-04: qty 2

## 2012-01-04 MED ORDER — HYDROCODONE-ACETAMINOPHEN 5-325 MG PO TABS: 2.0000 | ORAL_TABLET | ORAL | Status: DC | PRN

## 2012-01-04 MED ORDER — HYDROMORPHONE HCL PF 1 MG/ML IJ SOLN
0.5000 mg | Freq: Once | INTRAMUSCULAR | Status: AC
  Administered 2012-01-04: 0.5 mg via INTRAVENOUS
  Filled 2012-01-04: qty 1

## 2012-01-04 MED ORDER — CEPHALEXIN 250 MG PO CAPS: 250.0000 mg | ORAL_CAPSULE | Freq: Four times a day (QID) | ORAL | Status: DC

## 2012-01-04 MED ORDER — VANCOMYCIN HCL IN DEXTROSE 1-5 GM/200ML-% IV SOLN
1000.0000 mg | Freq: Once | INTRAVENOUS | Status: AC
  Administered 2012-01-04: 1000 mg via INTRAVENOUS
  Filled 2012-01-04: qty 200

## 2012-01-04 MED ORDER — SULFAMETHOXAZOLE-TRIMETHOPRIM 800-160 MG PO TABS: 1.0000 | ORAL_TABLET | Freq: Two times a day (BID) | ORAL | Status: DC

## 2012-01-04 NOTE — ED Provider Notes (Signed)
History     CSN: [id number]  Arrival date & time 01/04/12  0000   First MD Initiated Contact with Patient 01/04/12 0119      Chief Complaint  Patient presents with  . Abscess    (Consider location/radiation/quality/duration/timing/severity/associated sxs/prior treatment) HPI Comments: Patient has a large abscess, in his left axilla, has a history of an abscess in his right axilla.  Several years ago.  He's noticed, that he's had superficial skin.  [place], over the last advised by his primary care physician not to use a tear, which he has done.  He, states, that the abscess is not draining.  It is very painful.  He has not had generalized myalgias, or fever  Patient is a 61 y.o. male presenting with abscess. The history is provided by the patient.  Abscess  This is a new problem. The problem has been gradually worsening. The abscess is present on the left arm. The problem is severe. The abscess first occurred at home. Pertinent negatives include no fever.    Past Medical History  Diagnosis Date  . Diabetes mellitus   . Hypertension   . Coronary artery disease   . Hepatitis C   . High cholesterol   . Arthritis     Past Surgical History  Procedure Date  . Joint replacement   . Esophagogastroduodenoscopy 02/09/2011    Procedure: ESOPHAGOGASTRODUODENOSCOPY (EGD);  Surgeon: Barrie Folk, MD;  Location: [facility] ENDOSCOPY;  Service: Endoscopy;  Laterality: N/A;    History reviewed. No pertinent family history.  History  Substance Use Topics  . Smoking status: Current Every Day Smoker    Types: Cigars  . Smokeless tobacco: Not on file  . 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  Gastrointestinal: Negative for nausea.  Musculoskeletal: Negative for myalgias.  Skin: Positive for wound.  Neurological: Negative for weakness and headaches.    Allergies  Mobic  Home Medications   Current  Outpatient Rx  Name Route Sig Dispense Refill  . ASPIRIN EC 81 MG PO TBEC Oral Take 81 mg by mouth daily.    . ATORVASTATIN CALCIUM 10 MG PO TABS Oral Take 10 mg by mouth daily.      [name] GLIPIZIDE ER 5 MG PO TB24 Oral Take 5 mg by mouth daily.      [name] METFORMIN HCL ER 500 MG PO TB24 Oral Take 500 mg by mouth 2 (two) times daily.     . ADULT MULTIVITAMIN W/MINERALS CH Oral Take 1 tablet by mouth daily.    [name] PIOGLITAZONE HCL 30 MG PO TABS Oral Take 30 mg by mouth daily.    . SERTRALINE HCL 50 MG PO TABS Oral Take 50 mg by mouth daily.      . CEPHALEXIN 250 MG PO CAPS Oral Take 1 capsule (250 mg total) by mouth 4 (four) times daily. 28 capsule 0  . HYDROCODONE-ACETAMINOPHEN 5-325 MG PO TABS Oral Take 2 tablets by mouth every 4 (four) hours as needed for pain. 6 tablet 0  . SULFAMETHOXAZOLE-TRIMETHOPRIM 800-160 MG PO TABS Oral Take 1 tablet by mouth 2 (two) times daily. 14 tablet 0    BP 159/101  Pulse 95  Temp 98.7 F (37.1 C) (Oral)  Resp 18  SpO2 97%  Physical Exam  Constitutional: He appears well-developed.  HENT:  Head: Normocephalic.  Neck: Normal range of motion.  Cardiovascular: Normal  rate.   Musculoskeletal: Normal range of motion. He exhibits no tenderness.  Skin: Skin is warm. No rash noted. There is erythema.       Large pointing abscess in the left axilla 5 cm x 3 cm with 10 cm surrounding erythema    ED Course  INCISION AND DRAINAGE Date/Time: 01/04/2012 1:55 AM Performed by: Arman Filter Authorized by: Arman Filter Consent: Verbal consent obtained. Risks and benefits: risks, benefits and alternatives were discussed Consent given by: patient Patient understanding: patient states understanding of the procedure being performed Patient identity confirmed: verbally with patient Time out: Immediately prior to procedure a "time out" was called to verify the correct patient, procedure, equipment, support staff and site/side marked as required. Type: abscess Body area:  upper extremity Location details: left arm Anesthesia: local infiltration Local anesthetic: lidocaine 1% without epinephrine Anesthetic total: 4 ml Patient sedated: no Scalpel size: 11 Needle gauge: 22 Incision type: elliptical Drainage: purulent Drainage amount: copious Wound treatment: wound left open Patient tolerance: Patient tolerated the procedure well with no immediate complications.   (including critical care time)  Labs Reviewed  POCT I-STAT, CHEM 8 - Abnormal; Notable for the following:    Glucose, Bld 158 (*)     All other components within normal limits  CBC   No results found.   1. Abscess and cellulitis       MDM  Abscess with cellulitis placed on antbx and FU 3 days         Arman Filter, NP 01/04/12 0335  Arman Filter, NP 01/04/12 0335  Arman Filter, NP 01/04/12 [phone]

## 2012-01-04 NOTE — ED Provider Notes (Signed)
Medical screening examination/treatment/procedure(s) were performed by non-physician practitioner and as supervising physician I was immediately available for consultation/collaboration.   Dione Booze, MD 01/04/12 [phone]

## 2012-01-04 NOTE — ED Notes (Signed)
Gail at bedside to drain abscess

## 2012-01-04 NOTE — ED Notes (Signed)
Reports left axillary pain, large grapefruit sized induration,. Hot to touch. Began 2 days ago and has increased in size.

## 2012-01-04 NOTE — ED Notes (Signed)
The pt has had pain and swelling under his lt arm for 4 days.  The pain redness and swelling has been getting worse.  He has 2 areas  approx size of tennis balls under his  arm

## 2012-01-25 ENCOUNTER — Encounter (HOSPITAL_COMMUNITY): Payer: Self-pay

## 2012-01-25 ENCOUNTER — Emergency Department (HOSPITAL_COMMUNITY)
Admission: EM | Admit: 2012-01-25 | Discharge: 2012-01-25 | Disposition: A | Discharge status: Home / Self Care | Payer: Medicaid Other | Attending: Emergency Medicine | Admitting: Emergency Medicine

## 2012-01-25 DIAGNOSIS — B1920 Unspecified viral hepatitis C without hepatic coma: Secondary | ICD-10-CM | POA: Insufficient documentation

## 2012-01-25 DIAGNOSIS — I10 Essential (primary) hypertension: Secondary | ICD-10-CM | POA: Insufficient documentation

## 2012-01-25 DIAGNOSIS — L0291 Cutaneous abscess, unspecified: Secondary | ICD-10-CM

## 2012-01-25 DIAGNOSIS — E7800 Pure hypercholesterolemia, unspecified: Secondary | ICD-10-CM | POA: Insufficient documentation

## 2012-01-25 DIAGNOSIS — E1169 Type 2 diabetes mellitus with other specified complication: Secondary | ICD-10-CM | POA: Insufficient documentation

## 2012-01-25 DIAGNOSIS — F17200 Nicotine dependence, unspecified, uncomplicated: Secondary | ICD-10-CM | POA: Insufficient documentation

## 2012-01-25 DIAGNOSIS — Z79899 Other long term (current) drug therapy: Secondary | ICD-10-CM | POA: Insufficient documentation

## 2012-01-25 DIAGNOSIS — I2510 Atherosclerotic heart disease of native coronary artery without angina pectoris: Secondary | ICD-10-CM | POA: Insufficient documentation

## 2012-01-25 DIAGNOSIS — R509 Fever, unspecified: Secondary | ICD-10-CM | POA: Insufficient documentation

## 2012-01-25 DIAGNOSIS — Z7982 Long term (current) use of aspirin: Secondary | ICD-10-CM | POA: Insufficient documentation

## 2012-01-25 DIAGNOSIS — M129 Arthropathy, unspecified: Secondary | ICD-10-CM | POA: Insufficient documentation

## 2012-01-25 DIAGNOSIS — R739 Hyperglycemia, unspecified: Secondary | ICD-10-CM

## 2012-01-25 DIAGNOSIS — N61 Mastitis without abscess: Secondary | ICD-10-CM | POA: Insufficient documentation

## 2012-01-25 DIAGNOSIS — M549 Dorsalgia, unspecified: Secondary | ICD-10-CM | POA: Insufficient documentation

## 2012-01-25 LAB — CBC WITH DIFFERENTIAL/PLATELET
Basophils Relative: 0 % (ref 0–1)
Eosinophils Absolute: 0.2 10*3/uL (ref 0.0–0.7)
Eosinophils Relative: 2 % (ref 0–5)
Hemoglobin: 15.2 g/dL (ref 13.0–17.0)
Lymphs Abs: 3.8 10*3/uL (ref 0.7–4.0)
MCH: 29.5 pg (ref 26.0–34.0)
MCHC: 34.7 g/dL (ref 30.0–36.0)
MCV: 85 fL (ref 78.0–100.0)
Monocytes Relative: 7 % (ref 3–12)
RBC: 5.15 MIL/uL (ref 4.22–5.81)

## 2012-01-25 LAB — GLUCOSE, CAPILLARY
Glucose-Capillary: 314 mg/dL — ABNORMAL HIGH (ref 70–99)
Glucose-Capillary: 295 mg/dL — ABNORMAL HIGH (ref 70–99)
Glucose-Capillary: 200 mg/dL — ABNORMAL HIGH (ref 70–99)

## 2012-01-25 LAB — BASIC METABOLIC PANEL
BUN: 12 mg/dL (ref 6–23)
Calcium: 9.8 mg/dL (ref 8.4–10.5)
Creatinine, Ser: 0.6 mg/dL (ref 0.50–1.35)
GFR calc Af Amer: >90 mL/min (ref >90)
GFR calc non Af Amer: >90 mL/min (ref >90)
Glucose, Bld: 401 mg/dL — ABNORMAL HIGH (ref 70–99)

## 2012-01-25 MED ORDER — DOXYCYCLINE HYCLATE 100 MG PO TABS
100.0000 mg | ORAL_TABLET | Freq: Once | ORAL | Status: AC
  Administered 2012-01-25: 100 mg via ORAL
  Filled 2012-01-25: qty 1

## 2012-01-25 MED ORDER — DOXYCYCLINE HYCLATE 100 MG PO CAPS: 100.0000 mg | ORAL_CAPSULE | Freq: Two times a day (BID) | ORAL | Status: DC

## 2012-01-25 MED ORDER — SODIUM CHLORIDE 0.9 % IV BOLUS (SEPSIS)
1000.0000 mL | Freq: Once | INTRAVENOUS | Status: AC
  Administered 2012-01-25: 1000 mL via INTRAVENOUS

## 2012-01-25 MED ORDER — IBUPROFEN 800 MG PO TABS
800.0000 mg | ORAL_TABLET | Freq: Once | ORAL | Status: AC
  Administered 2012-01-25: 800 mg via ORAL
  Filled 2012-01-25: qty 1

## 2012-01-25 MED ORDER — HYDROCODONE-ACETAMINOPHEN 5-325 MG PO TABS: 2.0000 | ORAL_TABLET | ORAL | Status: DC | PRN

## 2012-01-25 NOTE — ED Notes (Signed)
Meal tray ordered 

## 2012-01-25 NOTE — ED Provider Notes (Signed)
History     CSN: [id number]  Arrival date & time 01/25/12  1150   First MD Initiated Contact with Patient 01/25/12 1220      Chief Complaint  Patient presents with  . Abscess    (Consider location/radiation/quality/duration/timing/severity/associated sxs/prior treatment) HPI Comments: 61 y/o male presents to the ED with multiple abscesses after having an I&D performed two weeks ago. He was placed on keflex at that time and completed the entire course. The initial abscess under his left axilla has not returned, however he developed one under his right axilla and a few small ones under left axilla over the past couple days. He was able to "pop" the pus out of some of the smaller ones. States they are painful. The pain radiates to his back. Has not tried any alleviating factors for pain. Admits to associated fever and chills last night. He did not take his temperature. He is a type 2 diabetic and states his sugars are usually well controlled.  The history is provided by the patient.    Past Medical History  Diagnosis Date  . Diabetes mellitus   . Hypertension   . Coronary artery disease   . Hepatitis C   . High cholesterol   . Arthritis     Past Surgical History  Procedure Date  . Joint replacement   . Esophagogastroduodenoscopy 02/09/2011    Procedure: ESOPHAGOGASTRODUODENOSCOPY (EGD);  Surgeon: Barrie Folk, MD;  Location: [facility] ENDOSCOPY;  Service: Endoscopy;  Laterality: N/A;    No family history on file.  History  Substance Use Topics  . Smoking status: Current Every Day Smoker    Types: Cigars  . Smokeless tobacco: Not on file  . 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  HENT: Negative for neck pain and neck stiffness.   Respiratory: Negative for shortness of breath.   Cardiovascular: Negative for chest pain.  Gastrointestinal: Negative for nausea and vomiting.    Genitourinary: Negative.   Musculoskeletal: Positive for back pain.  Skin:       Positive for multiple abscess  Neurological: Negative for dizziness and light-headedness.  Psychiatric/Behavioral: Negative for confusion.    Allergies  Mobic  Home Medications   Current Outpatient Rx  Name  Route  Sig  Dispense  Refill  . ASPIRIN EC 81 MG PO TBEC   Oral   Take 81 mg by mouth daily.         . ATORVASTATIN CALCIUM 10 MG PO TABS   Oral   Take 10 mg by mouth daily.           [name] VITAMIN D 1000 UNITS PO TABS   Oral   Take 1,000 Units by mouth daily.         [name] GLIPIZIDE ER 5 MG PO TB24   Oral   Take 5 mg by mouth daily.           . ADULT MULTIVITAMIN W/MINERALS CH   Oral   Take 1 tablet by mouth daily.         . QUINAPRIL HCL 10 MG PO TABS   Oral   Take 10 mg by mouth daily.         . SERTRALINE HCL 50 MG PO TABS   Oral   Take 50 mg by mouth daily.           [name]  METFORMIN HCL ER 500 MG PO TB24   Oral   Take 500 mg by mouth 2 (two) times daily.          [name] PIOGLITAZONE HCL 30 MG PO TABS   Oral   Take 30 mg by mouth daily.           BP 135/84  Pulse 105  Temp 99.9 F (37.7 C) (Oral)  Resp 20  SpO2 99%  Physical Exam  Nursing note and vitals reviewed. Constitutional: He is oriented to person, place, and time. He appears well-developed and well-nourished. No distress.  HENT:  Head: Normocephalic and atraumatic.  Mouth/Throat: Oropharynx is clear and moist. No oral lesions.  Eyes: Conjunctivae normal and EOM are normal.  Neck: Normal range of motion. Neck supple.  Cardiovascular: Regular rhythm.  Tachycardia present.   Pulmonary/Chest: Effort normal and breath sounds normal.  Abdominal: Soft. Bowel sounds are normal. There is no tenderness.  Musculoskeletal: Normal range of motion. He exhibits no edema.  Lymphadenopathy:    He has no axillary adenopathy.  Neurological: He is alert and oriented to person, place, and time.  Skin: Skin is warm  and dry.       1 inch diameter abscess present under right axillary region with surrounding erythema and edema. Fluctuant. Tender to palpation. No active drainage. Multiple healing abscesses under left axilla. 1 cm diameter indurated abscess without surrounding erythema and edema under left axilla. No active drainage.  Psychiatric: He has a normal mood and affect. His behavior is normal.    ED Course  Procedures (including critical care time) INCISION AND DRAINAGE Performed by: Johnnette Gourd Consent: Verbal consent obtained. Risks and benefits: risks, benefits and alternatives were discussed Type: abscess  Body area: left and right axilla  Anesthesia: local infiltration  Incision was made with a scalpel.  Local anesthetic: lidocaine 2% with epinephrine  Anesthetic total: 4 ml into right, 3 ml into left  Complexity: complex Blunt dissection to break up loculations  Drainage: purulent  Drainage amount: large on right, small on left  Packing material: none needed  Patient tolerance: Patient tolerated the procedure well with no immediate complications.  Labs Reviewed  GLUCOSE, CAPILLARY - Abnormal; Notable for the following:    Glucose-Capillary 425 (*)     All other components within normal limits  BASIC METABOLIC PANEL - Abnormal; Notable for the following:    Sodium 132 (*)     Chloride 95 (*)     Glucose, Bld 401 (*)     All other components within normal limits  GLUCOSE, CAPILLARY - Abnormal; Notable for the following:    Glucose-Capillary 314 (*)     All other components within normal limits  GLUCOSE, CAPILLARY - Abnormal; Notable for the following:    Glucose-Capillary 295 (*)     All other components within normal limits  CBC WITH DIFFERENTIAL  WOUND CULTURE   No results found.   1. Abscess and cellulitis   2. Hyperglycemia       MDM  61 y/o diabetic male with recurrent abscesses. Two abscesses drained without any problem. Labs WNL. Culture obtained  from right sided abscess. Patient placed on doxycycline. Hyperglycemic- advised patient to follow up with endocrinologist for better diabetes control. Fluid bolus given to control glucose in ED. Glucose decreased to 295 from 425 with first bolus. He will be moved to CDU for another bolus until glucose is better controlled. Dahlia Client Muthersbaugh, PA-C will take over care of patient at this time.  Case discussed with Dr. Bebe Shaggy who agrees with plan of care.        Trevor Mace, PA-C 01/25/12 1529

## 2012-01-25 NOTE — ED Provider Notes (Signed)
Medical screening examination/treatment/procedure(s) were performed by non-physician practitioner and as supervising physician I was immediately available for consultation/collaboration.   Joya Gaskins, MD 01/25/12 [phone]

## 2012-01-25 NOTE — ED Provider Notes (Signed)
Patient placed in CDU by Zadie Rhine, M.D. for fluid administration, glucose monitoring. Patient care resumed from [place], [place] .  Patient is here for multiple abscesses in the left axilla and hyperglycemia and has received doxycycline, pain medication, IV fluids.   Plan per previous provider is to monitor glucose until level reaches 200 or below, discharge home with doxycycline prescription.  Patient re-evaluated and is resting comfortably, VSS, with no new complaints or concerns at this time.  On exam: hemodynamically stable, NAD, heart w/ RRR, lungs CTAB, Chest & abd non-tender, no peripheral edema or calf tenderness.  BP 149/72  Pulse 78  Temp 99.9 F (37.7 C) (Oral)  Resp 20  SpO2 100%  Discussed with patient current lab and imaging results as well as their care plan, patient questions answered.  Patient is amenable to the plan.   7:40 PM Glucose 200.  Patient tolerating by mouth, states his headache and other pain has resolved.  We'll discharge home with doxycycline and pain medicine.  Discussed the need for consistent glucose control as well as followup with his primary care physician.  Also discussed diet and exercise as part of glucose control regimen.  I have also discussed reasons to return immediately to the ER.  Patient expresses understanding and agrees with plan.  1. Medications: Doxycycline, Norco 2. Treatment: Rest, drink plenty of fluids, take your home medications as prescribed, equal well-balanced diet with plenty of fruits and vegetables 3. Follow Up: With primary care physician about hyperglycemia and infection   Dierdre Forth, PA-C 01/25/12 2008

## 2012-01-25 NOTE — ED Notes (Signed)
Pt c/o pain all over especially at I&D site and Headache. PA notified.

## 2012-01-25 NOTE — ED Provider Notes (Signed)
Medical screening examination/treatment/procedure(s) were performed by non-physician practitioner and as supervising physician I was immediately available for consultation/collaboration.   Gwyneth Sprout, MD 01/25/12 [phone]

## 2012-01-25 NOTE — ED Notes (Signed)
Pt presents with 2 week h/o multiple abscesses to L axilla.  Pt was seen here x 2 weeks ago with I&D performed, has taken all abx.  Pt reports other areas continue to pop up, last one to R side of chest.  Pt reports onset of generalized body aches since yesterday with headache.  Pt reports areas have been draining "pus and blood".

## 2012-01-28 ENCOUNTER — Telehealth (HOSPITAL_COMMUNITY): Payer: Self-pay | Admitting: Emergency Medicine

## 2012-01-28 LAB — WOUND CULTURE

## 2012-01-28 NOTE — ED Notes (Signed)
+   Abscess culture + MRSA. Treated with Doxycycline, sensitive to same per protocol MD.

## 2012-01-30 NOTE — ED Notes (Signed)
Unable to contact via phone.'Letter sent to [facility] address. 

## 2012-03-05 DIAGNOSIS — E119 Type 2 diabetes mellitus without complications: Secondary | ICD-10-CM

## 2012-03-05 HISTORY — DX: Type 2 diabetes mellitus without complications: E11.9

## 2012-08-20 ENCOUNTER — Ambulatory Visit: Payer: Medicaid Other | Attending: Family Medicine | Admitting: Internal Medicine

## 2012-08-20 VITALS — BP 152/97 | HR 104 | Temp 98.7°F | Resp 20 | Ht 65.0 in | Wt 161.0 lb

## 2012-08-20 DIAGNOSIS — I739 Peripheral vascular disease, unspecified: Secondary | ICD-10-CM | POA: Insufficient documentation

## 2012-08-20 DIAGNOSIS — F329 Major depressive disorder, single episode, unspecified: Secondary | ICD-10-CM

## 2012-08-20 DIAGNOSIS — I10 Essential (primary) hypertension: Secondary | ICD-10-CM | POA: Insufficient documentation

## 2012-08-20 DIAGNOSIS — E785 Hyperlipidemia, unspecified: Secondary | ICD-10-CM | POA: Insufficient documentation

## 2012-08-20 DIAGNOSIS — K219 Gastro-esophageal reflux disease without esophagitis: Secondary | ICD-10-CM | POA: Insufficient documentation

## 2012-08-20 DIAGNOSIS — E119 Type 2 diabetes mellitus without complications: Secondary | ICD-10-CM | POA: Insufficient documentation

## 2012-08-20 LAB — POCT GLYCOSYLATED HEMOGLOBIN (HGB A1C): Hemoglobin A1C: 10.9

## 2012-08-20 LAB — LIPID PANEL
HDL: 42 mg/dL (ref >39)
LDL Cholesterol: 156 mg/dL — ABNORMAL HIGH (ref 0–99)

## 2012-08-20 MED ORDER — ALCOHOL SWABS PADS: 1.0000 | MEDICATED_PAD | Freq: Two times a day (BID) | Status: DC

## 2012-08-20 MED ORDER — GLUCOSE BLOOD VI STRP: ORAL_STRIP | Status: DC

## 2012-08-20 MED ORDER — QUINAPRIL HCL 10 MG PO TABS: 10.0000 mg | ORAL_TABLET | Freq: Every day | ORAL | Status: DC

## 2012-08-20 MED ORDER — GLIPIZIDE ER 5 MG PO TB24: 5.0000 mg | ORAL_TABLET | Freq: Every day | ORAL | Status: DC

## 2012-08-20 MED ORDER — FAMOTIDINE 20 MG PO TABS: 20.0000 mg | ORAL_TABLET | Freq: Two times a day (BID) | ORAL | Status: DC

## 2012-08-20 MED ORDER — METFORMIN HCL ER 500 MG PO TB24: 500.0000 mg | ORAL_TABLET | Freq: Two times a day (BID) | ORAL | Status: DC

## 2012-08-20 MED ORDER — ADULT MULTIVITAMIN W/MINERALS CH: 1.0000 | ORAL_TABLET | Freq: Every day | ORAL | Status: DC

## 2012-08-20 MED ORDER — CILOSTAZOL 100 MG PO TABS: 100.0000 mg | ORAL_TABLET | Freq: Two times a day (BID) | ORAL | Status: DC

## 2012-08-20 MED ORDER — PIOGLITAZONE HCL 30 MG PO TABS: 30.0000 mg | ORAL_TABLET | Freq: Every day | ORAL | Status: DC

## 2012-08-20 MED ORDER — PROMETHAZINE HCL 25 MG PO TABS: 25.0000 mg | ORAL_TABLET | Freq: Four times a day (QID) | ORAL | Status: DC | PRN

## 2012-08-20 MED ORDER — ATORVASTATIN CALCIUM 10 MG PO TABS: 10.0000 mg | ORAL_TABLET | Freq: Every day | ORAL | Status: DC

## 2012-08-20 MED ORDER — ONETOUCH ULTRASOFT LANCETS MISC: Status: DC

## 2012-08-20 MED ORDER — VITAMIN D 1000 UNITS PO TABS: 1000.0000 [IU] | ORAL_TABLET | Freq: Every day | ORAL | Status: AC

## 2012-08-20 MED ORDER — ASPIRIN EC 81 MG PO TBEC: 81.0000 mg | DELAYED_RELEASE_TABLET | Freq: Every day | ORAL | Status: DC

## 2012-08-20 MED ORDER — SERTRALINE HCL 50 MG PO TABS: 50.0000 mg | ORAL_TABLET | Freq: Every day | ORAL | Status: DC

## 2012-08-20 NOTE — Progress Notes (Signed)
Patient ID: [name], male   DOB: 1950/06/03, 62 y.o.   MRN: [id number]  CC: Establishing care  HPI: 62 year old male with multiple comorbidities including diabetes, hypertension, coronary artery disease as well as peripheral vascular disease, dyslipidemia and hepatitis C who presented to our clinic for establishing care. Patient reports no new complaints at this time. No shortness of breath, chest pain or palpitations. No complaints of abdominal pain, nausea or vomiting. No lightheadedness or loss of consciousness. She does admit to being noncompliant with medications and he ran out of medications and did not have money to schedule followup appointments with physicians.  Allergies  Allergen Reactions  . Mobic (Meloxicam) Rash   Past Medical History  Diagnosis Date  . Diabetes mellitus   . Hypertension   . Coronary artery disease   . Hepatitis C   . High cholesterol   . Arthritis   . Hepatitis    Current Outpatient Prescriptions on File Prior to Visit  Medication Sig Dispense Refill  . HYDROcodone-acetaminophen (NORCO/VICODIN) 5-325 MG per tablet Take 2 tablets by mouth every 4 (four) hours as needed for pain.  12 tablet  0   No current facility-administered medications on file prior to visit.   Family History  Problem Relation Age of Onset  . Stroke Mother   . Diabetes Mother   . Hypertension Mother   . Heart disease Father   . Hypertension Father    History   Social History  . Marital Status: Single    Spouse Name: N/A    Number of Children: N/A  . Years of Education: N/A   Occupational History  . Not on file.   Social History Main Topics  . Smoking status: Current Every Day Smoker    Types: Cigars  . Smokeless tobacco: Not on file  . 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 Drug Use: No  . Sexually Active: Not on file   Other Topics Concern  . Not on file   Social History Narrative  . No narrative on file    Review  of Systems  Constitutional: Negative for fever, chills, diaphoresis, activity change, appetite change and fatigue.  HENT: Negative for ear pain, nosebleeds, congestion, facial swelling, rhinorrhea, neck pain, neck stiffness and ear discharge.   Eyes: Negative for pain, discharge, redness, itching and visual disturbance.  Respiratory: Negative for cough, choking, chest tightness, shortness of breath, wheezing and stridor.   Cardiovascular: Negative for chest pain, palpitations and leg swelling.  Gastrointestinal: Negative for abdominal distention.  Genitourinary: Negative for dysuria, urgency, frequency, hematuria, flank pain, decreased urine volume, difficulty urinating and dyspareunia.  Musculoskeletal: Negative for back pain, joint swelling, arthralgias and gait problem.  Neurological: Negative for dizziness, tremors, seizures, syncope, facial asymmetry, speech difficulty, weakness, light-headedness, numbness and headaches.  Hematological: Negative for adenopathy. Does not bruise/bleed easily.  Psychiatric/Behavioral: Negative for hallucinations, behavioral problems, confusion, dysphoric mood, decreased concentration and agitation.    Objective:   Filed Vitals:   08/20/12 1538  BP: 152/97  Pulse: 104  Temp: 98.7 F (37.1 C)  Resp:     Physical Exam  Constitutional: Appears well-developed and well-nourished. No distress.  HENT: Normocephalic. External right and left ear normal. Oropharynx is clear and moist.  Eyes: Conjunctivae and EOM are normal. PERRLA, no scleral icterus.  Neck: Normal ROM. Neck supple. No JVD. No tracheal deviation. No thyromegaly.  CVS: RRR, S1/S2 +, no murmurs, no gallops, no  carotid bruit.  Pulmonary: Effort and breath sounds normal, no stridor, rhonchi, wheezes, rales.  Abdominal: Soft. BS +,  no distension, tenderness, rebound or guarding.  Musculoskeletal: Normal range of motion. No edema and no tenderness.  Lymphadenopathy: No lymphadenopathy noted,  cervical, inguinal. Neuro: Alert. Normal reflexes, muscle tone coordination. No cranial nerve deficit. Skin: Skin is warm and dry. No rash noted. Not diaphoretic. No erythema. No pallor.  Psychiatric: Normal mood and affect. Behavior, judgment, thought content normal.   Lab Results  Component Value Date   WBC 9.8 01/25/2012   HGB 15.2 01/25/2012   HCT 43.8 01/25/2012   MCV 85.0 01/25/2012   PLT 214 01/25/2012   Lab Results  Component Value Date   CREATININE 0.60 01/25/2012   BUN 12 01/25/2012   NA 132* 01/25/2012   K 4.3 01/25/2012   CL 95* 01/25/2012   CO2 28 01/25/2012    Lab Results  Component Value Date   HGBA1C 7.3* 02/09/2011   Lipid Panel     Component Value Date/Time   CHOL  Value: 214              01/14/2009 0146   TRIG 122 01/14/2009 0146   HDL 46 01/14/2009 0146   CHOLHDL 4.7 01/14/2009 0146   VLDL 24 01/14/2009 0146   LDLCALC  Value: 144         01/14/2009 0146       Assessment and plan:   Patient Active Problem List   Diagnosis Date Noted  . Essential hypertension, benign 08/20/2012    Priority: High - We have discussed target BP range - I have advised pt to check BP regularly and to call us back if the numbers are higher than 140/90 - discussed the importance of compliance with medical therapy and diet  - Blood pressure 152/97, above goal considering patient is diabetic - Patient is however noncompliant with medications. The patient has run out of his medications - During this visit we have stressed the importance of compliance as mentioned above and prescriptions were provided for quinapril 10 mg daily. Patient will followup with [place] in one month to recheck blood pressure and will make necessary adjustments to blood pressure medications depending on blood pressure during that visit.   . Diabetes - in past, uncontrolled with complications of peripheral vascular disease. Hemoglobin A1c on file is from 2012 and no recent A1c recorded.  - Obtained A1c  today. Will up results - Correction provided for metformin, Actos, glipizide  08/20/2012    Priority: High  . Dyslipidemia 08/20/2012    Priority: High - Check lipid panel  - Didn't current statin therapy   . Depression 08/20/2012    Priority: High - Continue Zoloft   . GERD (gastroesophageal reflux disease) 08/20/2012    Priority: Medium - Continue Pepcid twice daily 20 mg   . PVD (peripheral vascular disease) 08/20/2012    Priority: Medium - Continue cilostazol 100 mg twice daily

## 2012-08-20 NOTE — Progress Notes (Addendum)
Patient ID: [name], male   DOB: [birth year], 62 y.o.   MRN: [id number] Reviewed A1c for the patient. It is 10.9. This is obviously indicating poor glycemic control. Patient is however non compliant with oral antihyperglycemics. Prescriptions provided for patient's antihyperglycemics and pt was instructed to come back in 1 month for follow up. In 3 months from today we will obtain another A1c to check glucose control.  Manson Passey [facility] [phone]  Additionally, I was informed that certain medications are normal prescribed in patient's pharmacy. We ended up changing to quinapril to lisinopril 10 mg daily. We changed Lipitor to Crestor 20 mg at bedtime, we changed Pepcid to Nexium 40 mg daily.  Manson Passey [facility] [phone]

## 2012-08-20 NOTE — Patient Instructions (Addendum)
Hypertension As your heart beats, it forces blood through your arteries. This force is your blood pressure. If the pressure is too high, it is called hypertension (HTN) or high blood pressure. HTN is dangerous because you may have it and not know it. High blood pressure may mean that your heart has to work harder to pump blood. Your arteries may be narrow or stiff. The extra work puts you at risk for heart disease, stroke, and other problems.  Blood pressure consists of two numbers, a higher number over a lower, 110/72, for example. It is stated as "110 over 72." The ideal is below 120 for the top number (systolic) and under 80 for the bottom (diastolic). Write down your blood pressure today. You should pay close attention to your blood pressure if you have certain conditions such as:  Heart failure.  Prior heart attack.  Diabetes  Chronic kidney disease.  Prior stroke.  Multiple risk factors for heart disease. To see if you have HTN, your blood pressure should be measured while you are seated with your arm held at the level of the heart. It should be measured at least twice. A one-time elevated blood pressure reading (especially in the Emergency Department) does not mean that you need treatment. There may be conditions in which the blood pressure is different between your right and left arms. It is important to see your caregiver soon for a recheck. Most people have essential hypertension which means that there is not a specific cause. This type of high blood pressure may be lowered by changing lifestyle factors such as:  Stress.  Smoking.  Lack of exercise.  Excessive weight.  Drug/tobacco/alcohol use.  Eating less salt. Most people do not have symptoms from high blood pressure until it has caused damage to the body. Effective treatment can often prevent, delay or reduce that damage. TREATMENT  When a cause has been identified, treatment for high blood pressure is directed at the  cause. There are a large number of medications to treat HTN. These fall into several categories, and your caregiver will help you select the medicines that are best for you. Medications may have side effects. You should review side effects with your caregiver. If your blood pressure stays high after you have made lifestyle changes or started on medicines,   Your medication(s) may need to be changed.  Other problems may need to be addressed.  Be certain you understand your prescriptions, and know how and when to take your medicine.  Be sure to follow up with your caregiver within the time frame advised (usually within two weeks) to have your blood pressure rechecked and to review your medications.  If you are taking more than one medicine to lower your blood pressure, make sure you know how and at what times they should be taken. Taking two medicines at the same time can result in blood pressure that is too low. SEEK IMMEDIATE MEDICAL CARE IF:  You develop a severe headache, blurred or changing vision, or confusion.  You have unusual weakness or numbness, or a faint feeling.  You have severe chest or abdominal pain, vomiting, or breathing problems. MAKE SURE YOU:   Understand these instructions.  Will watch your condition.  Will get help right away if you are not doing well or get worse. Document Released: 02/19/2005 Document Revised: 05/14/2011 Document Reviewed: 10/10/2007 Exit[employer] Patient Information 2014 [employer]. Blood Sugar Monitoring, Adult GLUCOSE METERS FOR SELF-MONITORING OF BLOOD GLUCOSE  It is important to   be able to correctly measure your blood sugar (glucose). You can use a blood glucose monitor (a small battery-operated device) to check your glucose level at any time. This allows you and your caregiver to monitor your diabetes and to determine how well your treatment plan is working. The process of monitoring your blood glucose with a glucose meter is called  self-monitoring of blood glucose (SMBG). When people with diabetes control their blood sugar, they have better health. To test for glucose with a typical glucose meter, place the disposable strip in the meter. Then place a small sample of blood on the "test strip." The test strip is coated with chemicals that combine with glucose in blood. The meter measures how much glucose is present. The meter displays the glucose level as a number. Several new models can record and store a number of test results. Some models can connect to personal computers to store test results or print them out.  Newer meters are often easier to use than older models. Some meters allow you to get blood from places other than your fingertip. Some new models have automatic timing, error codes, signals, or barcode readers to help with proper adjustment (calibration). Some meters have a large display screen or spoken instructions for people with visual impairments.  INSTRUCTIONS FOR USING GLUCOSE METERS  Wash your hands with soap and warm water, or clean the area with alcohol. Dry your hands completely.  Prick the side of your fingertip with a lancet (a sharp-pointed tool used by hand).  Hold the hand down and gently milk the finger until a small drop of blood appears. Catch the blood with the test strip.  Follow the instructions for inserting the test strip and using the SMBG meter. Most meters require the meter to be turned on and the test strip to be inserted before applying the blood sample.  Record the test result.  Read the instructions carefully for both the meter and the test strips that go with it. Meter instructions are found in the user manual. Keep this manual to help you solve any problems that may arise. Many meters use "error codes" when there is a problem with the meter, the test strip, or the blood sample on the strip. You will need the manual to understand these error codes and fix the problem.  New devices are  available such as laser lancets and meters that can test blood taken from "alternative sites" of the body, other than fingertips. However, you should use standard fingertip testing if your glucose changes rapidly. Also, use standard testing if:  You have eaten, exercised, or taken insulin in the past 2 hours.  You think your glucose is low.  You tend to not feel symptoms of low blood glucose (hypoglycemia).  You are ill or under stress.  Clean the meter as directed by the manufacturer.  Test the meter for accuracy as directed by the manufacturer.  Take your meter with you to your caregiver's office. This way, you can test your glucose in front of your caregiver to make sure you are using the meter correctly. Your caregiver can also take a sample of blood to test using a routine lab method. If values on the glucose meter are close to the lab results, you and your caregiver will see that your meter is working well and you are using good technique. Your caregiver will advise you about what to do if the results do not match. FREQUENCY OF TESTING  Your caregiver will   tell you how often you should check your blood glucose. This will depend on your type of diabetes, your current level of diabetes control, and your types of medicines. The following are general guidelines, but your care plan may be different. Record all your readings and the time of day you took them for review with your caregiver.   Diabetes type 1.  When you are using insulin with good diabetic control (either multiple daily injections or via a pump), you should check your glucose 4 times a day.  If your diabetes is not well controlled, you may need to monitor more frequently, including before meals and 2 hours after meals, at bedtime, and occasionally between 2 a.m. and 3 a.m.  You should always check your glucose before a dose of insulin or before changing the rate on your insulin pump.  Diabetes type 2.  Guidelines for SMBG  in diabetes type 2 are not as well defined.  If you are on insulin, follow the guidelines above.  If you are on medicines, but not insulin, and your glucose is not well controlled, you should test at least twice daily.  If you are not on insulin, and your diabetes is controlled with medicines or diet alone, you should test at least once daily, usually before breakfast.  A weekly profile will help your caregiver advise you on your care plan. The week before your visit, check your glucose before a meal and 2 hours after a meal at least daily. You may want to test before and after a different meal each day so you and your caregiver can tell how well controlled your blood sugars are throughout the course of a 24 hour period.  Gestational diabetes (diabetes during pregnancy).  Frequent testing is often necessary. Accurate timing is important.  If you are not on insulin, check your glucose 4 times a day. Check it before breakfast and 1 hour after the start of each meal.  If you are on insulin, check your glucose 6 times a day. Check it before each meal and 1 hour after the first bite of each meal.  General guidelines.  More frequent testing is required at the start of insulin treatment. Your caregiver will instruct you.  Test your glucose any time you suspect you have low blood sugar (hypoglycemia).  You should test more often when you change medicines, when you have unusual stress or illness, or in other unusual circumstances. OTHER THINGS TO KNOW ABOUT GLUCOSE METERS  Measurement Range. Most glucose meters are able to read glucose levels over a broad range of values from as low as 0 to as high as 600 mg/dL. If you get an extremely high or low reading from your meter, you should first confirm it with another reading. Report very high or very low readings to your caregiver.  Whole Blood Glucose versus Plasma Glucose. Some older home glucose meters measure glucose in your whole blood. In a lab  or when using some newer home glucose meters, the glucose is measured in your plasma (one component of blood). The difference can be important. It is important for you and your caregiver to know whether your meter gives its results as "whole blood equivalent" or "plasma equivalent."  Display of High and Low Glucose Values. Part of learning how to operate a meter is understanding what the meter results mean. Know how high and low glucose concentrations are displayed on your meter.  Factors that Affect Glucose Meter Performance. The accuracy of your test   results depends on many factors and varies depending on the brand and type of meter. These factors include:  Low red blood cell count (anemia).  Substances in your blood (such as uric acid, vitamin C, and others).  Environmental factors (temperature, humidity, altitude).  Name-brand versus generic test strips.  Calibration. Make sure your meter is set up properly. It is a good idea to do a calibration test with a control solution recommended by the manufacturer of your meter whenever you begin using a fresh bottle of test strips. This will help verify the accuracy of your meter.  Improperly stored, expired, or defective test strips. Keep your strips in a dry place with the lid on.  Soiled meter.  Inadequate blood sample. NEW TECHNOLOGIES FOR GLUCOSE TESTING Alternative site testing Some glucose meters allow testing blood from alternative sites. These include the:  Upper arm.  Forearm.  Base of the thumb.  Thigh. Sampling blood from alternative sites may be desirable. However, it may have some limitations. Blood in the fingertips show changes in glucose levels more quickly than blood in other parts of the body. This means that alternative site test results may be different from fingertip test results, not because of the meter's ability to test accurately, but because the actual glucose concentration can be different.  Continuous Glucose  Monitoring Devices to measure your blood glucose continuously are available, and others are in development. These methods can be more expensive than self-monitoring with a glucose meter. However, it is uncertain how effective and reliable these devices are. Your caregiver will advise you if this approach makes sense for you. IF BLOOD SUGARS ARE CONTROLLED, PEOPLE WITH DIABETES REMAIN HEALTHIER.  SMBG is an important part of the treatment plan of patients with diabetes mellitus. Below are reasons for using SMBG:   It confirms that your glucose is at a specific, healthy level.  It detects hypoglycemia and severe hyperglycemia.  It allows you and your caregiver to make adjustments in response to changes in lifestyle for individuals requiring medicine.  It determines the need for starting insulin therapy in temporary diabetes that happens during pregnancy (gestational diabetes). Document Released: 02/22/2003 Document Revised: 05/14/2011 Document Reviewed: 06/15/2010 Exit[employer] Patient Information 2014 [employer].   

## 2012-08-21 ENCOUNTER — Other Ambulatory Visit: Payer: Self-pay | Admitting: Internal Medicine

## 2012-08-21 MED ORDER — ATORVASTATIN CALCIUM 10 MG PO TABS: 20.0000 mg | ORAL_TABLET | Freq: Every day | ORAL | Status: DC

## 2012-08-21 NOTE — Progress Notes (Addendum)
Patient ID: [name], male   DOB: November 18, 1950, 62 y.o.   MRN: [id number] Blood work reviewed as results became available. Patient's cholesterol panel is above the goal, specifically triglycerides are 174. We have changed the prescription to 20 mg daily Lipitor instead of 10 mg daily.  [facility] [phone]

## 2012-08-22 ENCOUNTER — Telehealth: Payer: Self-pay | Admitting: Family Medicine

## 2012-08-22 MED ORDER — ESOMEPRAZOLE MAGNESIUM 40 MG PO CPDR: 40.0000 mg | DELAYED_RELEASE_CAPSULE | Freq: Every day | ORAL | Status: DC

## 2012-08-22 MED ORDER — LISINOPRIL 10 MG PO TABS: 10.0000 mg | ORAL_TABLET | Freq: Every day | ORAL | Status: DC

## 2012-08-22 MED ORDER — ROSUVASTATIN CALCIUM 20 MG PO TABS: 20.0000 mg | ORAL_TABLET | Freq: Every day | ORAL | Status: DC

## 2012-08-22 NOTE — Telephone Encounter (Addendum)
Pt came for visit on 08/20/12 and received 15 scripts.  Pt has orange card and [facility] are saying that they do have any of these meds and did not fill any of the scripts.  Pt coming to clinic to see what can be done.  [facility] phone number is [phone].

## 2012-08-22 NOTE — Telephone Encounter (Signed)
[facility] who say they  1. no longer have Zoloft for pt assist, pt enrolls in MAP can obtain cimbalta or prozac.  2. Quinapril no longer, have lisinopril $6.  3. For lipitor can use crestor.  4. Nexum instead of pepsid (for free).  5. Do have Metformin, test strips, permefazin, lancets - but need pay $6/piece. Pt said had no money. (This would be through [facility], not [facility].  6. Do not have Vitamin D or Multivitamin because do not carry over the counter items. 7. Don't carry glipiZIDE (GLUCOTROL XL) 5 MG 24 hr tablet, might be able to obtain through [employer]. They had an enrollment plan for free not sure if still around.  8. No longer carry Actos, will potentially have substitute in late July.   Pt advised that he would need to enroll in MAP and will meet with Pharmacist but that will not happen until mid-late July. Pharmacy says that can at least have some of these filled if script is changed.   Pt on way here.

## 2012-08-26 ENCOUNTER — Telehealth: Payer: Self-pay | Admitting: Family Medicine

## 2012-08-26 NOTE — Telephone Encounter (Signed)
Pt says needs Actos, is there an alternative that [facility] might have?    Pt is confused about how to obtain all meds on his list.  Please call pt back.

## 2012-09-16 NOTE — Telephone Encounter (Signed)
Patient needs actos-is there a generic he can get from [facility]

## 2012-09-19 ENCOUNTER — Ambulatory Visit: Payer: No Typology Code available for payment source | Attending: Family Medicine | Admitting: Family Medicine

## 2012-09-19 ENCOUNTER — Encounter: Payer: Self-pay | Admitting: Family Medicine

## 2012-09-19 VITALS — BP 147/100 | HR 86 | Temp 98.6°F | Resp 16 | Ht 66.0 in | Wt 165.8 lb

## 2012-09-19 DIAGNOSIS — I739 Peripheral vascular disease, unspecified: Secondary | ICD-10-CM

## 2012-09-19 DIAGNOSIS — K219 Gastro-esophageal reflux disease without esophagitis: Secondary | ICD-10-CM

## 2012-09-19 DIAGNOSIS — N289 Disorder of kidney and ureter, unspecified: Secondary | ICD-10-CM

## 2012-09-19 DIAGNOSIS — B1920 Unspecified viral hepatitis C without hepatic coma: Secondary | ICD-10-CM

## 2012-09-19 DIAGNOSIS — Z87891 Personal history of nicotine dependence: Secondary | ICD-10-CM | POA: Insufficient documentation

## 2012-09-19 DIAGNOSIS — B182 Chronic viral hepatitis C: Secondary | ICD-10-CM | POA: Insufficient documentation

## 2012-09-19 DIAGNOSIS — F17200 Nicotine dependence, unspecified, uncomplicated: Secondary | ICD-10-CM

## 2012-09-19 DIAGNOSIS — E119 Type 2 diabetes mellitus without complications: Secondary | ICD-10-CM

## 2012-09-19 LAB — COMPLETE METABOLIC PANEL WITH GFR
AST: 47 U/L — ABNORMAL HIGH (ref 0–37)
Albumin: 4.5 g/dL (ref 3.5–5.2)
Alkaline Phosphatase: 68 U/L (ref 39–117)
BUN: 16 mg/dL (ref 6–23)
Calcium: 9.3 mg/dL (ref 8.4–10.5)
Chloride: 99 mEq/L (ref 96–112)
Creat: 0.7 mg/dL (ref 0.50–1.35)
GFR, Est Non African American: >89 mL/min
Glucose, Bld: 188 mg/dL — ABNORMAL HIGH (ref 70–99)

## 2012-09-19 MED ORDER — GLUCOSE BLOOD VI STRP: ORAL_STRIP | Status: DC

## 2012-09-19 MED ORDER — PANTOPRAZOLE SODIUM 40 MG PO TBEC: 40.0000 mg | DELAYED_RELEASE_TABLET | Freq: Every day | ORAL | Status: DC

## 2012-09-19 NOTE — Progress Notes (Signed)
Pt being seen today for dental/eye referral. Medication refill and exchange. F/u BP bp elevated

## 2012-09-19 NOTE — Patient Instructions (Addendum)
Hepatitis C °Hepatitis C is a viral infection of the liver. Infection may go undetected for months or years because symptoms may be absent or very mild. Chronic liver disease is the main danger of hepatitis C. This may lead to scarring of the liver (cirrhosis), liver failure, and liver cancer. °CAUSES  °Hepatitis C is caused by the hepatitis C virus (HCV). Formerly, hepatitis C infections were most commonly transmitted through blood transfusions. In the early 1990s, routine testing of donated blood for hepatitis C and exclusion of blood that tests positive for HCV began. Now, HCV is most commonly transmitted from person to person through injection drug use, sharing needles, or sex with an infected person. A caregiver may also get the infection from exposure to the blood of an infected patient by way of a cut or needle stick.  °SYMPTOMS  °Acute Phase °Many cases of acute HCV infection are mild and cause few problems. Some people may not even realize they are sick. Symptoms in others may last a few weeks to several months and include: °· Feeling very tired. °· Loss of appetite. °· Nausea. °· Vomiting. °· Abdominal pain. °· Dark yellow urine. °· Yellow skin and eyes (jaundice). °· Itching of the skin. °Chronic Phase °· Between 50% to 85% of people who get HCV infection become "chronic carriers." They often have no symptoms, but the virus stays in their body. They may spread the virus to others and can get long-term liver disease. °· Many people with chronic HCV infection remain healthy for many years. However, up to 1 in 5 chronically infected people may develop severe liver diseases including scarring of the liver (cirrhosis), liver failure, or liver cancer. °DIAGNOSIS  °Diagnosis of hepatitis C infection is made by testing blood for the presence of hepatitis C viral particles called RNA. Other tests may also be done to measure the status of current liver function, exclude other liver problems, or assess liver  damage. °TREATMENT  °Treatment with many antiviral drugs is available and recommended for some patients with chronic HCV infection. Drug treatment is generally considered appropriate for patients who: °· Are 18 years of age or older. °· Have a positive test for HCV particles in the blood. °· Have a liver tissue sample (biopsy) that shows chronic hepatitis and significant scarring (fibrosis). °· Do not have signs of liver failure. °· Have acceptable blood test results that confirm the wellness of other body organs. °· Are willing to be treated and conform to treatment requirements. °· Have no other circumstances that would prevent treatment from being recommended (contraindications). °All people who are offered and choose to receive drug treatment must understand that careful medical follow up for many months and even years is crucial in order to make successful care possible. The goal of drug treatment is to eliminate any evidence of HCV in the blood on a long-term basis. This is called a "sustained virologic response" or SVR. Achieving a SVR is associated with a decrease in the chance of life-threatening liver problems, need for a liver transplant, liver cancer rates, and liver-related complications. °Successful treatment currently requires taking treatment drugs for at least 24 weeks and up to 72 weeks. An injected drug (interferon) given weekly and an oral antiviral medicine taken daily are usually prescribed. Side effects from these drugs are common and some may be very serious. Your response to treatment must be carefully monitored by both you and your caregiver throughout the entire treatment period. °PREVENTION °There is no vaccine for hepatitis C. The only   way to prevent the disease is to reduce the risk of exposure to the virus.  °· Avoid sharing drug needles or personal items like toothbrushes, razors, and nail clippers with an infected person. °· Healthcare workers need to avoid injuries and wear  appropriate protective equipment such as gloves, gowns, and face masks when performing invasive medical or nursing procedures. °HOME CARE INSTRUCTIONS  °To avoid making your liver disease worse: °· Strictly avoid drinking alcohol. °· Carefully review all new prescriptions of medicines with your caregiver. Ask your caregiver which drugs you should avoid. The following drugs are toxic to the liver, and your caregiver may tell you to avoid them: °· Isoniazid. °· Methyldopa. °· Acetaminophen. °· Anabolic steroids (muscle-building drugs). °· Erythromycin. °· Oral contraceptives (birth control pills). °· Check with your caregiver to make sure medicine you are currently taking will not be harmful. °· Periodic blood tests may be required. Follow your caregiver's advice about when you should have blood tests. °· Avoid a sexual relationship until advised otherwise by your caregiver. °· Avoid activities that could expose other people to your blood. Examples include sharing a toothbrush, nail clippers, razors, and needles. °· Bed rest is not necessary, but it may make you feel better. Recovery time is not related to the amount of rest you receive. °· This infection is contagious. Follow your caregiver's instructions in order to avoid spread of the infection. °SEEK IMMEDIATE MEDICAL CARE IF: °· You have increasing fatigue or weakness. °· You have an oral temperature above 102° F (38.9° C), not controlled by medicine. °· You develop loss of appetite, nausea, or vomiting. °· You develop jaundice. °· You develop easy bruising or bleeding. °· You develop any severe problems as a result of your treatment. °MAKE SURE YOU:  °· Understand these instructions. °· Will watch your condition. °· Will get help right away if you are not doing well or get worse. °Document Released: 02/17/2000 Document Revised: 05/14/2011 Document Reviewed: 06/21/2010 °[employer]® Patient Information ©2014 [employer]. ° °

## 2012-09-19 NOTE — Progress Notes (Signed)
Patient ID: [name], male   DOB: 1950-05-03, 62 y.o.   MRN: [id number]  CC: follow up   HPI: Pt is presenting today for follow up .  Pt is having a lot of socioeconomic struggles.  He is reporting that he wants to see Dr. Marguerita Merles for his Hepatitis C.     Allergies  Allergen Reactions  . Mobic (Meloxicam) Rash   Past Medical History  Diagnosis Date  . Diabetes mellitus   . Hypertension   . Coronary artery disease   . Hepatitis C   . High cholesterol   . Arthritis   . Hepatitis    Current Outpatient Prescriptions on File Prior to Visit  Medication Sig Dispense Refill  . Alcohol Swabs PADS 1 Package by Does not apply route 2 (two) times daily.  100 each  3  . aspirin EC 81 MG tablet Take 1 tablet (81 mg total) by mouth daily.  30 tablet  0  . cholecalciferol (VITAMIN D) 1000 UNITS tablet Take 1 tablet (1,000 Units total) by mouth daily.  30 tablet  0  . cilostazol (PLETAL) 100 MG tablet Take 1 tablet (100 mg total) by mouth 2 (two) times daily.  60 tablet  0  . esomeprazole (NEXIUM) 40 MG capsule Take 1 capsule (40 mg total) by mouth daily.  30 capsule  3  . glipiZIDE (GLUCOTROL XL) 5 MG 24 hr tablet Take 1 tablet (5 mg total) by mouth daily.  30 tablet  0  . glucose blood test strip Use as instructed  100 each  0  . HYDROcodone-acetaminophen (NORCO/VICODIN) 5-325 MG per tablet Take 2 tablets by mouth every 4 (four) hours as needed for pain.  12 tablet  0  . Lancets (ONETOUCH ULTRASOFT) lancets Use as instructed  100 each  12  . lisinopril (PRINIVIL,ZESTRIL) 10 MG tablet Take 1 tablet (10 mg total) by mouth daily.  90 tablet  3  . metFORMIN (GLUCOPHAGE-XR) 500 MG 24 hr tablet Take 1 tablet (500 mg total) by mouth 2 (two) times daily.  60 tablet  0  . Multiple Vitamin (MULTIVITAMIN WITH MINERALS) TABS Take 1 tablet by mouth daily.  30 tablet  0  . pioglitazone (ACTOS) 30 MG tablet Take 1 tablet (30 mg total) by mouth daily.  30 tablet  0  . promethazine (PHENERGAN) 25 MG tablet  Take 1 tablet (25 mg total) by mouth every 6 (six) hours as needed for nausea.  30 tablet  0  . rosuvastatin (CRESTOR) 20 MG tablet Take 1 tablet (20 mg total) by mouth daily.  90 tablet  3  . sertraline (ZOLOFT) 50 MG tablet Take 1 tablet (50 mg total) by mouth daily.  30 tablet  0   No current facility-administered medications on file prior to visit.   Family History  Problem Relation Age of Onset  . Stroke Mother   . Diabetes Mother   . Hypertension Mother   . Heart disease Father   . Hypertension Father    History   Social History  . Marital Status: Single    Spouse Name: N/A    Number of Children: N/A  . Years of Education: N/A   Occupational History  . Not on file.   Social History Main Topics  . Smoking status: Current Every Day Smoker    Types: Cigars  . Smokeless tobacco: Not on file  . 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 Drug Use: No  . Sexually Active: Not on file   Other Topics Concern  . Not on file   Social History Narrative  . No narrative on file    Review of Systems  Constitutional: Negative for fever, chills, diaphoresis, activity change, appetite change and fatigue.  HENT: Negative for ear pain, nosebleeds, congestion, facial swelling, rhinorrhea, neck pain, neck stiffness and ear discharge.   Eyes: Negative for pain, discharge, redness, itching and visual disturbance.  Respiratory: Negative for cough, choking, chest tightness, shortness of breath, wheezing and stridor.   Cardiovascular: Negative for chest pain, palpitations and leg swelling.  Gastrointestinal: Negative for abdominal distention.  Genitourinary: Negative for dysuria, urgency, frequency, hematuria, flank pain, decreased urine volume, difficulty urinating and dyspareunia.  Musculoskeletal: Negative for back pain, joint swelling, arthralgias and gait problem.  Neurological: Negative for dizziness, tremors, seizures, syncope, facial asymmetry,  speech difficulty, weakness, light-headedness, numbness and headaches.  Hematological: Negative for adenopathy. Does not bruise/bleed easily.  Psychiatric/Behavioral: Negative for hallucinations, behavioral problems, confusion, dysphoric mood, decreased concentration and agitation.    Objective:   Filed Vitals:   09/19/12 1109  BP: 147/100  Pulse: 86  Temp: 98.6 F (37 C)  Resp: 16    Physical Exam  Constitutional: Appears well-developed and well-nourished. No distress.  HENT: Normocephalic. External right and left ear normal. Oropharynx is clear and moist.  Eyes: Conjunctivae and EOM are normal. PERRLA, no scleral icterus.  Neck: Normal ROM. Neck supple. No JVD. No tracheal deviation. No thyromegaly.  CVS: RRR, S1/S2 +, no murmurs, no gallops, no carotid bruit.  Pulmonary: Effort and breath sounds normal, no stridor, rhonchi, wheezes, rales.  Abdominal: Soft. BS +,  no distension, tenderness, rebound or guarding.  Musculoskeletal: weak peripheral pulses in bilateral LEs  Lymphadenopathy: No lymphadenopathy noted, cervical, inguinal. Neuro: Alert. Normal reflexes, muscle tone coordination. No cranial nerve deficit. Skin: Skin is warm and dry. No rash noted. Not diaphoretic. No erythema. No pallor.  Psychiatric: Normal mood and affect. Behavior, judgment, thought content normal.   Lab Results  Component Value Date   WBC 9.8 01/25/2012   HGB 15.2 01/25/2012   HCT 43.8 01/25/2012   MCV 85.0 01/25/2012   PLT 214 01/25/2012   Lab Results  Component Value Date   CREATININE 0.60 01/25/2012   BUN 12 01/25/2012   NA 132* 01/25/2012   K 4.3 01/25/2012   CL 95* 01/25/2012   CO2 28 01/25/2012    Lab Results  Component Value Date   HGBA1C 10.9 08/20/2012   Lipid Panel     Component Value Date/Time   CHOL 233* 08/20/2012 1556   TRIG 174* 08/20/2012 1556   HDL 42 08/20/2012 1556   CHOLHDL 5.5 08/20/2012 1556   VLDL 35 08/20/2012 1556   LDLCALC 156* 08/20/2012 1556       Assessment and plan:   Patient Active Problem List   Diagnosis Date Noted  . Hepatitis C 09/19/2012  . Essential hypertension, benign 08/20/2012  . Diabetes 08/20/2012  . Dyslipidemia 08/20/2012  . Depression 08/20/2012  . GERD (gastroesophageal reflux disease) 08/20/2012  . PVD (peripheral vascular disease) 08/20/2012  . 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 Renal insufficiency 02/11/2011  . Anemia 02/11/2011  . GI bleed 02/11/2011   Refilled meds today  The patient was counseled on the dangers of tobacco use, and was advised to quit.  Reviewed strategies to maximize success, including removing cigarettes and smoking materials from environment.  Claudication  History of renal insufficiency  Check CMP today  RTC in 3 months for recheck   The patient was given clear instructions to go to ER or return to medical center if symptoms don't improve, worsen or new problems develop.  The patient verbalized understanding.  The patient was told to call to get any lab results if not heard anything in the next week.    Rodney Langton, MD, CDE, FAAFP [facility] [place], [place]

## 2012-09-20 NOTE — Progress Notes (Signed)
Quick Note:  Please inform patient that labs came back stable. Liver enzymes were still elevated but unchanged from 1 year ago.   Rodney Langton, MD, CDE, FAAFP [facility] [place], [place]   ______

## 2012-09-22 ENCOUNTER — Telehealth: Payer: Self-pay | Admitting: *Deleted

## 2012-09-22 NOTE — Telephone Encounter (Signed)
09/22/12 Patient unavailable message left for patient via telephone labs came back stable.  liver enzymes were still elevated but unchanged from 1 year ago. P.Tamiya Colello,RN BSN [facility]

## 2012-09-24 ENCOUNTER — Other Ambulatory Visit: Payer: Self-pay | Admitting: Internal Medicine

## 2012-09-24 ENCOUNTER — Other Ambulatory Visit: Payer: Self-pay | Admitting: Family Medicine

## 2012-09-25 NOTE — Telephone Encounter (Signed)
Medication refill

## 2012-09-26 ENCOUNTER — Telehealth: Payer: Self-pay | Admitting: Family Medicine

## 2012-09-26 MED ORDER — GLIPIZIDE ER 5 MG PO TB24: ORAL_TABLET | ORAL | Status: DC

## 2012-09-26 MED ORDER — METFORMIN HCL ER 500 MG PO TB24: 500.0000 mg | ORAL_TABLET | Freq: Two times a day (BID) | ORAL | Status: DC

## 2012-09-26 MED ORDER — LISINOPRIL 10 MG PO TABS: 10.0000 mg | ORAL_TABLET | Freq: Every day | ORAL | Status: DC

## 2012-09-26 MED ORDER — GLUCOSE BLOOD VI STRP: ORAL_STRIP | Status: DC

## 2012-09-26 NOTE — Telephone Encounter (Signed)
Pt has called about med refill for acupupril, glipizive (5mg ), metformin (500mg ; twice a day); Acupupril is filled @ [facility] and the last two are refilled at [facility]; pt has requested strips to check his sugar levels. Pt does have a One Touch Ultra 2 but would like to have Presto if possible because they are more affordable.

## 2012-10-17 ENCOUNTER — Emergency Department (HOSPITAL_COMMUNITY): Payer: Self-pay

## 2012-10-17 ENCOUNTER — Encounter (HOSPITAL_COMMUNITY): Payer: Self-pay | Admitting: Emergency Medicine

## 2012-10-17 ENCOUNTER — Emergency Department (HOSPITAL_COMMUNITY)
Admission: EM | Admit: 2012-10-17 | Discharge: 2012-10-17 | Disposition: A | Discharge status: Home / Self Care | Payer: Self-pay | Attending: Emergency Medicine | Admitting: Emergency Medicine

## 2012-10-17 DIAGNOSIS — M25549 Pain in joints of unspecified hand: Secondary | ICD-10-CM | POA: Insufficient documentation

## 2012-10-17 DIAGNOSIS — E7800 Pure hypercholesterolemia, unspecified: Secondary | ICD-10-CM | POA: Insufficient documentation

## 2012-10-17 DIAGNOSIS — Z8619 Personal history of other infectious and parasitic diseases: Secondary | ICD-10-CM | POA: Insufficient documentation

## 2012-10-17 DIAGNOSIS — M1990 Unspecified osteoarthritis, unspecified site: Secondary | ICD-10-CM

## 2012-10-17 DIAGNOSIS — M19049 Primary osteoarthritis, unspecified hand: Secondary | ICD-10-CM | POA: Insufficient documentation

## 2012-10-17 DIAGNOSIS — M25449 Effusion, unspecified hand: Secondary | ICD-10-CM | POA: Insufficient documentation

## 2012-10-17 DIAGNOSIS — I2510 Atherosclerotic heart disease of native coronary artery without angina pectoris: Secondary | ICD-10-CM | POA: Insufficient documentation

## 2012-10-17 DIAGNOSIS — E119 Type 2 diabetes mellitus without complications: Secondary | ICD-10-CM | POA: Insufficient documentation

## 2012-10-17 DIAGNOSIS — Z9660 Presence of unspecified orthopedic joint implant: Secondary | ICD-10-CM | POA: Insufficient documentation

## 2012-10-17 DIAGNOSIS — Z79899 Other long term (current) drug therapy: Secondary | ICD-10-CM | POA: Insufficient documentation

## 2012-10-17 DIAGNOSIS — I10 Essential (primary) hypertension: Secondary | ICD-10-CM | POA: Insufficient documentation

## 2012-10-17 DIAGNOSIS — M2550 Pain in unspecified joint: Secondary | ICD-10-CM

## 2012-10-17 DIAGNOSIS — Z7982 Long term (current) use of aspirin: Secondary | ICD-10-CM | POA: Insufficient documentation

## 2012-10-17 DIAGNOSIS — F17200 Nicotine dependence, unspecified, uncomplicated: Secondary | ICD-10-CM | POA: Insufficient documentation

## 2012-10-17 MED ORDER — NAPROXEN 250 MG PO TABS
500.0000 mg | ORAL_TABLET | Freq: Once | ORAL | Status: AC
  Administered 2012-10-17: 500 mg via ORAL
  Filled 2012-10-17: qty 2

## 2012-10-17 NOTE — ED Provider Notes (Signed)
CSN: [id number]     Arrival date & time 10/17/12  1121 History     First MD Initiated Contact with Patient 10/17/12 1133     Chief Complaint  Patient presents with  . Joint Pain   (Consider location/radiation/quality/duration/timing/severity/associated sxs/prior Treatment) HPI Comments: Patient is a 62 year old man past medical history significant for DM, HTN, CAD, Hepatitis C, and arthritis presenting to the emergency department for increasing bilateral moderate hand pain and stiffness without radiation since Sunday. Patient states the stiffness is worse in the morning along with attempting to make a fist or opening jars. Patient states he is also noted moderate swelling in his joints. Patient states it feels like his arthritis is acting but wanted to get it checked out. Patient rates his pain 7/10. He has not taken anything for his pain. He has not followed up with his primary care doctor regarding his bilateral hand pain.   Past Medical History  Diagnosis Date  . Diabetes mellitus   . Hypertension   . Coronary artery disease   . Hepatitis C   . High cholesterol   . Arthritis   . Hepatitis    Past Surgical History  Procedure Laterality Date  . Joint replacement    . Esophagogastroduodenoscopy  02/09/2011    Procedure: ESOPHAGOGASTRODUODENOSCOPY (EGD);  Surgeon: Barrie Folk, MD;  Location: [facility] ENDOSCOPY;  Service: Endoscopy;  Laterality: N/A;   Family History  Problem Relation Age of Onset  . Stroke Mother   . Diabetes Mother   . Hypertension Mother   . Heart disease Father   . Hypertension Father    History  Substance Use Topics  . Smoking status: Current Some Day Smoker  . Smokeless tobacco: Not on file  . 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  Musculoskeletal: Positive for joint swelling.  Skin: Negative.     Allergies  Mobic  Home Medications   Current Outpatient  Rx  Name  Route  Sig  Dispense  Refill  . Alcohol Swabs PADS   Does not apply   1 Package by Does not apply route 2 (two) times daily.   100 each   3   . aspirin EC 81 MG tablet   Oral   Take 1 tablet (81 mg total) by mouth daily.   30 tablet   0   . cholecalciferol (VITAMIN D) 1000 UNITS tablet   Oral   Take 1 tablet (1,000 Units total) by mouth daily.   30 tablet   0   . cilostazol (PLETAL) 100 MG tablet   Oral   Take 1 tablet (100 mg total) by mouth 2 (two) times daily.   60 tablet   0   . esomeprazole (NEXIUM) 40 MG capsule   Oral   Take 1 capsule (40 mg total) by mouth daily.   30 capsule   3   . glipiZIDE (GLIPIZIDE XL) 5 MG 24 hr tablet      TAKE ONE TABLET BY MOUTH DAILY   30 tablet   3   . glucose blood (AGAMATRIX PRESTO TEST) test strip      Use as instructed   100 each   12   . glucose blood (ONE TOUCH ULTRA TEST) test strip      Use as instructed   100 each   12   . glucose blood test strip  Use as instructed   100 each   0   . HYDROcodone-acetaminophen (NORCO/VICODIN) 5-325 MG per tablet   Oral   Take 2 tablets by mouth every 4 (four) hours as needed for pain.   12 tablet   0   . Lancets (ONETOUCH ULTRASOFT) lancets      Use as instructed   100 each   12   . lisinopril (PRINIVIL,ZESTRIL) 10 MG tablet   Oral   Take 1 tablet (10 mg total) by mouth daily.   30 tablet   3   . metFORMIN (GLUCOPHAGE-XR) 500 MG 24 hr tablet   Oral   Take 1 tablet (500 mg total) by mouth 2 (two) times daily with a meal.   60 tablet   3   . Multiple Vitamin (MULTIVITAMIN WITH MINERALS) TABS   Oral   Take 1 tablet by mouth daily.   30 tablet   0   . pantoprazole (PROTONIX) 40 MG tablet   Oral   Take 1 tablet (40 mg total) by mouth daily.   30 tablet   3   . pioglitazone (ACTOS) 30 MG tablet   Oral   Take 1 tablet (30 mg total) by mouth daily.   30 tablet   0   . promethazine (PHENERGAN) 25 MG tablet   Oral   Take 1 tablet (25  mg total) by mouth every 6 (six) hours as needed for nausea.   30 tablet   0   . rosuvastatin (CRESTOR) 20 MG tablet   Oral   Take 1 tablet (20 mg total) by mouth daily.   90 tablet   3   . sertraline (ZOLOFT) 50 MG tablet   Oral   Take 1 tablet (50 mg total) by mouth daily.   30 tablet   0    BP 144/72  Pulse 81  Temp(Src) 98.2 F (36.8 C) (Oral)  Resp 16  SpO2 98% Physical Exam  Constitutional: He is oriented to person, place, and time. He appears well-developed and well-nourished. No distress.  HENT:  Head: Normocephalic and atraumatic.  Eyes: Conjunctivae are normal.  Neck: Neck supple.  Musculoskeletal:       Right hand: He exhibits normal range of motion, normal two-point discrimination, normal capillary refill, no deformity and no laceration. Normal sensation noted. Decreased strength noted.       Left hand: He exhibits normal range of motion, normal two-point discrimination, normal capillary refill, no deformity and no laceration. Normal sensation noted. Decreased strength noted.  Minimal joint swelling appreciated on right and left hands. Decreased strength due to pain.  Neurological: He is alert and oriented to person, place, and time.  Skin: Skin is warm and dry. He is not diaphoretic.  Psychiatric: He has a normal mood and affect.    ED Course   Procedures (including critical care time)  Labs Reviewed - No data to display Dg Hand Complete Left  10/17/2012   *RADIOLOGY REPORT*  Clinical Data: bilateral hand joint pain, difficulty opening and closing hands  LEFT HAND - COMPLETE 3+ VIEW  Comparison: None.  Findings: There is narrowing and proliferative change of the first carpal/metacarpal joint.  The distal 2nd through 4th IP joints show mild osteophyte formation.  The second MCP joint shows subchrondral cystic change on both sides of the joint.  The 3rd MCP joint shows similar but less severe findings.  IMPRESSION: There are findings suggestive of osteoarthritis  (1st CMC, DIP's), but also involvement of 2nd and  3rd MCP joints that suggests possibility of rheumatoid arthritis.   Original Report Authenticated By: Esperanza Heir, M.D.   Dg Hand Complete Right  10/17/2012   *RADIOLOGY REPORT*  Clinical Data: Bilateral hand and joint pain with stiffness  RIGHT HAND - COMPLETE 3+ VIEW  Comparison: None.  Findings: There is mild narrowing and osteophyte formation of the first carpal metacarpal joint.  The third metacarpal phalangeal joint shows mild narrowing and osteophyte formation.  There is subchondral cystic change.  There is similar mild change in the second metacarpal phalangeal joint. There is mild narrowing of the distal interphalangeal joints with osteophyte seen in the third distal interphalangeal joint.  IMPRESSION: As on the contralateral side, there is evidence of mild osteoarthritis but there is also evidence to suggest rheumatoid arthritis as a possibility, and given the involvement of the second and third metacarpal phalangeal joints.   Original Report Authenticated By: Esperanza Heir, M.D.   1. Joint pain   2. Arthritis     MDM  No decreased sensation. Neurovascularly bilaterally. No deformity noted. No erythema or warmth or fever suggestive of septic joints. Imaging obtained with evidence of arthritis with suggestion for possibility of rheumatoid arthritis. Patient will be started on naproxen. Advised to followup with primary care physician to discuss x-ray findings and possible arthritis management regimen change. Return precautions discussed. Patient agreeable to plan. Patient stable at time of discharge.  Jeannetta Ellis, PA-C 10/17/12 1507

## 2012-10-17 NOTE — ED Notes (Signed)
Pt reports that since Sunday bilateral hand pain. Pt states he believe this is his arthritis is acting up. States difficulty opening and closing right hand due to pain and weakness. Moderate swelling noted to joints. Distal circulation intact.  Denies fever. Pt alert, oriented x4, speaking in full sentences in triage.

## 2012-10-17 NOTE — ED Provider Notes (Signed)
Medical screening examination/treatment/procedure(s) were performed by non-physician practitioner and as supervising physician I was immediately available for consultation/collaboration.  Raeford Razor, MD 10/17/12 [phone]

## 2012-10-21 ENCOUNTER — Telehealth: Payer: Self-pay | Admitting: Emergency Medicine

## 2012-10-21 NOTE — Telephone Encounter (Signed)
RX REFILL REQUEST FOR LIPITOR 10MG  TAB. THIS MEDICATION IS NOT ON MED LIST. CRESTOR IS THE CURRENT MEDICATION- Nonnie Done RN

## 2012-10-27 ENCOUNTER — Ambulatory Visit: Payer: No Typology Code available for payment source | Attending: Family Medicine | Admitting: Internal Medicine

## 2012-10-27 VITALS — BP 132/85 | HR 96 | Temp 98.2°F | Resp 18 | Wt 168.0 lb

## 2012-10-27 DIAGNOSIS — M1990 Unspecified osteoarthritis, unspecified site: Secondary | ICD-10-CM | POA: Insufficient documentation

## 2012-10-27 DIAGNOSIS — I739 Peripheral vascular disease, unspecified: Secondary | ICD-10-CM

## 2012-10-27 NOTE — Progress Notes (Signed)
Patient ID: [name], male   DOB: [birth year], 62 y.o.   MRN: [id number]  CC: Follow up  HPI: Patient is here today for followup hospital visit. He was seen in the urgent care recently for pain in his fingers both hands, later diagnosed as osteoarthritis. Patient has done very well now, pain as a result, no swelling, no history of injury. Medication given in the urgent care helps.  Allergies  Allergen Reactions  . Mobic [Meloxicam] Rash   Past Medical History  Diagnosis Date  . Diabetes mellitus   . Hypertension   . Coronary artery disease   . Hepatitis C   . High cholesterol   . Arthritis   . Hepatitis    Current Outpatient Prescriptions on File Prior to Visit  Medication Sig Dispense Refill  . Alcohol Swabs PADS 1 Package by Does not apply route 2 (two) times daily.  100 each  3  . aspirin EC 81 MG tablet Take 1 tablet (81 mg total) by mouth daily.  30 tablet  0  . cholecalciferol (VITAMIN D) 1000 UNITS tablet Take 1 tablet (1,000 Units total) by mouth daily.  30 tablet  0  . cilostazol (PLETAL) 100 MG tablet Take 1 tablet (100 mg total) by mouth 2 (two) times daily.  60 tablet  0  . esomeprazole (NEXIUM) 40 MG capsule Take 1 capsule (40 mg total) by mouth daily.  30 capsule  3  . glipiZIDE (GLIPIZIDE XL) 5 MG 24 hr tablet TAKE ONE TABLET BY MOUTH DAILY  30 tablet  3  . glucose blood (AGAMATRIX PRESTO TEST) test strip Use as instructed  100 each  12  . glucose blood (ONE TOUCH ULTRA TEST) test strip Use as instructed  100 each  12  . glucose blood test strip Use as instructed  100 each  0  . HYDROcodone-acetaminophen (NORCO/VICODIN) 5-325 MG per tablet Take 2 tablets by mouth every 4 (four) hours as needed for pain.  12 tablet  0  . Lancets (ONETOUCH ULTRASOFT) lancets Use as instructed  100 each  12  . lisinopril (PRINIVIL,ZESTRIL) 10 MG tablet Take 1 tablet (10 mg total) by mouth daily.  30 tablet  3  . metFORMIN (GLUCOPHAGE-XR) 500 MG 24 hr tablet Take 1 tablet (500 mg total)  by mouth 2 (two) times daily with a meal.  60 tablet  3  . Multiple Vitamin (MULTIVITAMIN WITH MINERALS) TABS Take 1 tablet by mouth daily.  30 tablet  0  . pantoprazole (PROTONIX) 40 MG tablet Take 1 tablet (40 mg total) by mouth daily.  30 tablet  3  . pioglitazone (ACTOS) 30 MG tablet Take 1 tablet (30 mg total) by mouth daily.  30 tablet  0  . promethazine (PHENERGAN) 25 MG tablet Take 1 tablet (25 mg total) by mouth every 6 (six) hours as needed for nausea.  30 tablet  0  . rosuvastatin (CRESTOR) 20 MG tablet Take 1 tablet (20 mg total) by mouth daily.  90 tablet  3  . sertraline (ZOLOFT) 50 MG tablet Take 1 tablet (50 mg total) by mouth daily.  30 tablet  0   No current facility-administered medications on file prior to visit.   Family History  Problem Relation Age of Onset  . Stroke Mother   . Diabetes Mother   . Hypertension Mother   . Heart disease Father   . Hypertension Father    History   Social History  . Marital Status: Single  Spouse Name: N/A    Number of Children: N/A  . Years of Education: N/A   Occupational History  . Not on file.   Social History Main Topics  . Smoking status: Current Some Day Smoker  . Smokeless tobacco: Not on file  . 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 Drug Use: No  . Sexual Activity: Not on file   Other Topics Concern  . Not on file   Social History Narrative  . No narrative on file    Review of Systems: Constitutional: Negative for fever, chills, diaphoresis, activity change, appetite change and fatigue. HENT: Negative for ear pain, nosebleeds, congestion, facial swelling, rhinorrhea, neck pain, neck stiffness and ear discharge.  Eyes: Negative for pain, discharge, redness, itching and visual disturbance. Respiratory: Negative for cough, choking, chest tightness, shortness of breath, wheezing and stridor.  Cardiovascular: Negative for chest pain, palpitations and leg  swelling. Gastrointestinal: Negative for abdominal distention. Genitourinary: Negative for dysuria, urgency, frequency, hematuria, flank pain, decreased urine volume, difficulty urinating and dyspareunia.  Musculoskeletal: Negative for back pain, joint swelling, arthralgias and gait problem. Neurological: Negative for dizziness, tremors, seizures, syncope, facial asymmetry, speech difficulty, weakness, light-headedness, numbness and headaches.  Hematological: Negative for adenopathy. Does not bruise/bleed easily. Psychiatric/Behavioral: Negative for hallucinations, behavioral problems, confusion, dysphoric mood, decreased concentration and agitation.    Objective:   Filed Vitals:   10/27/12 1413  BP: 132/85  Pulse: 96  Temp: 98.2 F (36.8 C)  Resp: 18    Physical Exam: Constitutional: Patient appears well-developed and well-nourished. No distress. HENT: Normocephalic, atraumatic, External right and left ear normal. Oropharynx is clear and moist.  Eyes: Conjunctivae and EOM are normal. PERRLA, no scleral icterus. Neck: Normal ROM. Neck supple. No JVD. No tracheal deviation. No thyromegaly. CVS: RRR, S1/S2 +, no murmurs, no gallops, no carotid bruit.  Pulmonary: Effort and breath sounds normal, no stridor, rhonchi, wheezes, rales.  Abdominal: Soft. BS +,  no distension, tenderness, rebound or guarding.  Musculoskeletal: Normal range of motion. No edema and no tenderness.  Lymphadenopathy: No lymphadenopathy noted, cervical, inguinal or axillary Neuro: Alert. Normal reflexes, muscle tone coordination. No cranial nerve deficit. Skin: Skin is warm and dry. No rash noted. Not diaphoretic. No erythema. No pallor. Psychiatric: Normal mood and affect. Behavior, judgment, thought content normal.  Lab Results  Component Value Date   WBC 9.8 01/25/2012   HGB 15.2 01/25/2012   HCT 43.8 01/25/2012   MCV 85.0 01/25/2012   PLT 214 01/25/2012   Lab Results  Component Value Date    CREATININE 0.70 09/19/2012   BUN 16 09/19/2012   NA 134* 09/19/2012   K 3.9 09/19/2012   CL 99 09/19/2012   CO2 26 09/19/2012    Lab Results  Component Value Date   HGBA1C 10.9 08/20/2012   Lipid Panel     Component Value Date/Time   CHOL 233* 08/20/2012 1556   TRIG 174* 08/20/2012 1556   HDL 42 08/20/2012 1556   CHOLHDL 5.5 08/20/2012 1556   VLDL 35 08/20/2012 1556   LDLCALC 156* 08/20/2012 1556       Assessment and plan:   Patient Active Problem List   Diagnosis Date Noted  . Arthritis 10/27/2012  . Osteoarthritis 10/27/2012  . Hepatitis C 09/19/2012  . Smoker 09/19/2012  . Claudication 09/19/2012  . Essential hypertension, benign 08/20/2012  . Diabetes 08/20/2012  . Dyslipidemia 08/20/2012  . Depression 08/20/2012  . GERD (  gastroesophageal reflux disease) 08/20/2012  . PVD (peripheral vascular disease) 08/20/2012  . 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 Renal insufficiency 02/11/2011  . Anemia 02/11/2011  . GI bleed 02/11/2011   Patient counseled extensively about smoking cessation Patient encouraged to be compliant with medications To continue the pain medication given in the urgent care  Patient will need to schedule appointment for general physical and lab draw  [name] was given clear instructions to go to ER or return to the clinic if symptoms don't improve, worsen or new problems develop.  [name] verbalized understanding.  [name] was told to call to get lab results if hasn't heard anything in the next week.        Jeanann Lewandowsky, MD [facility] [place], [place] [phone]   10/27/2012, 2:49 PM

## 2012-10-27 NOTE — Progress Notes (Signed)
Patient here for hospital follow up Was seen in ed for pain in his hands

## 2012-12-19 ENCOUNTER — Ambulatory Visit: Payer: No Typology Code available for payment source | Attending: Internal Medicine | Admitting: Internal Medicine

## 2012-12-19 ENCOUNTER — Encounter: Payer: Self-pay | Admitting: Internal Medicine

## 2012-12-19 ENCOUNTER — Other Ambulatory Visit: Payer: Self-pay | Admitting: Internal Medicine

## 2012-12-19 VITALS — BP 157/105 | HR 80 | Temp 98.3°F | Resp 17

## 2012-12-19 DIAGNOSIS — M79609 Pain in unspecified limb: Secondary | ICD-10-CM | POA: Insufficient documentation

## 2012-12-19 DIAGNOSIS — IMO0001 Reserved for inherently not codable concepts without codable children: Secondary | ICD-10-CM

## 2012-12-19 DIAGNOSIS — I10 Essential (primary) hypertension: Secondary | ICD-10-CM

## 2012-12-19 DIAGNOSIS — Z23 Encounter for immunization: Secondary | ICD-10-CM | POA: Insufficient documentation

## 2012-12-19 DIAGNOSIS — R262 Difficulty in walking, not elsewhere classified: Secondary | ICD-10-CM | POA: Insufficient documentation

## 2012-12-19 DIAGNOSIS — M25569 Pain in unspecified knee: Secondary | ICD-10-CM | POA: Insufficient documentation

## 2012-12-19 DIAGNOSIS — G609 Hereditary and idiopathic neuropathy, unspecified: Secondary | ICD-10-CM

## 2012-12-19 MED ORDER — ASPIRIN EC 81 MG PO TBEC: 81.0000 mg | DELAYED_RELEASE_TABLET | Freq: Every day | ORAL | Status: DC

## 2012-12-19 MED ORDER — GLUCOSE BLOOD VI STRP: ORAL_STRIP | Status: DC

## 2012-12-19 MED ORDER — OMEPRAZOLE 40 MG PO CPDR: 40.0000 mg | DELAYED_RELEASE_CAPSULE | Freq: Every day | ORAL | Status: DC

## 2012-12-19 MED ORDER — ROSUVASTATIN CALCIUM 20 MG PO TABS: 20.0000 mg | ORAL_TABLET | Freq: Every day | ORAL | Status: DC

## 2012-12-19 MED ORDER — LISINOPRIL 40 MG PO TABS: 40.0000 mg | ORAL_TABLET | Freq: Every day | ORAL | Status: DC

## 2012-12-19 MED ORDER — PIOGLITAZONE HCL 30 MG PO TABS: 30.0000 mg | ORAL_TABLET | Freq: Every day | ORAL | Status: DC

## 2012-12-19 MED ORDER — METFORMIN HCL ER 500 MG PO TB24: 500.0000 mg | ORAL_TABLET | Freq: Two times a day (BID) | ORAL | Status: DC

## 2012-12-19 MED ORDER — GLIPIZIDE ER 5 MG PO TB24: ORAL_TABLET | ORAL | Status: DC

## 2012-12-19 MED ORDER — GABAPENTIN 100 MG PO CAPS: 100.0000 mg | ORAL_CAPSULE | Freq: Three times a day (TID) | ORAL | Status: DC

## 2012-12-19 NOTE — Progress Notes (Signed)
Patient ID: [name], male   DOB: 12-07-1950, 62 y.o.   MRN: [id number] HPI 62year-old male with history of hypertension, diabetes mellitus here for followup. Patient reports having pain in his knees and calves radiating down to the feet. He reports having difficulty walking because of this.  Denies any headache, vision, dizziness, nausea, vomiting, palpitations, chest pain, shortness of breath, polyuria, polydipsia, numbness of extremities. Denies any bowel or urinary symptoms. Denies any change in his weight or appetite.  Vital signs in last 24 hours:  Filed Vitals:   12/19/12 1122  BP: 157/105  Pulse: 80  Temp: 98.3 F (36.8 C)  Resp: 17  SpO2: 100%      Physical Exam:  General: Elderly male in no acute distress. HEENT: no pallor, no icterus, moist oral mucosa,  Heart: Normal  s1 &s2  Regular rate and rhythm, without murmurs, rubs, gallops. Lungs: Clear to auscultation bilaterally. Abdomen: Soft, nontender, nondistended, positive bowel sounds. Extremities: Warm, no edema Neuro: Alert, awake, oriented x3, nonfocal.   Lab Results:  Basic Metabolic Panel:    Component Value Date/Time   NA 134* 09/19/2012 1209   K 3.9 09/19/2012 1209   CL 99 09/19/2012 1209   CO2 26 09/19/2012 1209   BUN 16 09/19/2012 1209   CREATININE 0.70 09/19/2012 1209   CREATININE 0.60 01/25/2012 1231   GLUCOSE 188* 09/19/2012 1209   CALCIUM 9.3 09/19/2012 1209   CBC:    Component Value Date/Time   WBC 9.8 01/25/2012 1231   HGB 15.2 01/25/2012 1231   HCT 43.8 01/25/2012 1231   PLT 214 01/25/2012 1231   MCV 85.0 01/25/2012 1231   NEUTROABS 5.1 01/25/2012 1231   LYMPHSABS 3.8 01/25/2012 1231   MONOABS 0.7 01/25/2012 1231   EOSABS 0.2 01/25/2012 1231   BASOSABS 0.0 01/25/2012 1231    No results found for this or any previous visit (from the past 240 hour(s)).  Studies/Results: No results found.  Medications: Reviewed  Assessment/Plan:  Uncontrolled hypertension Increase his lisinopril  dose to 40 mg counseled on dietary and medication adherence   Uncontrolled DM  A1C of 10 .9 during last visit. FSG runs between 180-240. Will repeat A1C today and adjust medications. Will refill prescriptions.also c/o peripheral neuropathy. Will add neurontin 100 mg tid. Previously used cilostazol without much relief.    hepC  doesnot follow with GI at present  will refer him to GI  Flu vaccine ordered  Follow up in 2 weeks for BP check  Tashena Ibach 12/19/2012, 12:09 PM

## 2012-12-19 NOTE — Progress Notes (Signed)
Patient here for follow up -DM Has been having pain from arthritis and naproxen not helping

## 2012-12-24 ENCOUNTER — Other Ambulatory Visit: Payer: Self-pay | Admitting: Emergency Medicine

## 2012-12-24 ENCOUNTER — Telehealth: Payer: Self-pay

## 2012-12-24 MED ORDER — ONETOUCH ULTRASOFT LANCETS MISC: Status: DC

## 2012-12-24 NOTE — Telephone Encounter (Signed)
Patient called facility back because he received a call and was following back up with it.

## 2013-01-01 ENCOUNTER — Other Ambulatory Visit: Payer: Self-pay | Admitting: Family Medicine

## 2013-01-01 NOTE — Telephone Encounter (Signed)
PATIENT needs medication refills  New scripts need to be sent to pharmacy

## 2013-01-15 ENCOUNTER — Ambulatory Visit: Payer: No Typology Code available for payment source | Attending: Internal Medicine | Admitting: Internal Medicine

## 2013-01-15 ENCOUNTER — Encounter: Payer: Self-pay | Admitting: Internal Medicine

## 2013-01-15 VITALS — BP 154/85 | HR 80 | Temp 98.1°F | Resp 16 | Ht 64.5 in | Wt 166.0 lb

## 2013-01-15 DIAGNOSIS — I10 Essential (primary) hypertension: Secondary | ICD-10-CM

## 2013-01-15 DIAGNOSIS — E785 Hyperlipidemia, unspecified: Secondary | ICD-10-CM

## 2013-01-15 DIAGNOSIS — B1920 Unspecified viral hepatitis C without hepatic coma: Secondary | ICD-10-CM

## 2013-01-15 DIAGNOSIS — I739 Peripheral vascular disease, unspecified: Secondary | ICD-10-CM

## 2013-01-15 DIAGNOSIS — F17200 Nicotine dependence, unspecified, uncomplicated: Secondary | ICD-10-CM | POA: Insufficient documentation

## 2013-01-15 MED ORDER — ROSUVASTATIN CALCIUM 20 MG PO TABS: 20.0000 mg | ORAL_TABLET | Freq: Every day | ORAL | Status: DC

## 2013-01-15 MED ORDER — PIOGLITAZONE HCL 30 MG PO TABS: 30.0000 mg | ORAL_TABLET | Freq: Every day | ORAL | Status: DC

## 2013-01-15 NOTE — Patient Instructions (Signed)
Smoking Cessation Quitting smoking is important to your health and has many advantages. However, it is not always easy to quit since nicotine is a very addictive drug. Often times, people try 3 times or more before being able to quit. This document explains the best ways for you to prepare to quit smoking. Quitting takes hard work and a lot of effort, but you can do it. ADVANTAGES OF QUITTING SMOKING  You will live longer, feel better, and live better.  Your body will feel the impact of quitting smoking almost immediately.  Within 20 minutes, blood pressure decreases. Your pulse returns to its normal level.  After 8 hours, carbon monoxide levels in the blood return to normal. Your oxygen level increases.  After 24 hours, the chance of having a heart attack starts to decrease. Your breath, hair, and body stop smelling like smoke.  After 48 hours, damaged nerve endings begin to recover. Your sense of taste and smell improve.  After 72 hours, the body is virtually free of nicotine. Your bronchial tubes relax and breathing becomes easier.  After 2 to 12 weeks, lungs can hold more air. Exercise becomes easier and circulation improves.  The risk of having a heart attack, stroke, cancer, or lung disease is greatly reduced.  After 1 year, the risk of coronary heart disease is cut in half.  After 5 years, the risk of stroke falls to the same as a nonsmoker.  After 10 years, the risk of lung cancer is cut in half and the risk of other cancers decreases significantly.  After 15 years, the risk of coronary heart disease drops, usually to the level of a nonsmoker.  If you are pregnant, quitting smoking will improve your chances of having a healthy baby.  The people you live with, especially any children, will be healthier.  You will have extra money to spend on things other than cigarettes. QUESTIONS TO THINK ABOUT BEFORE ATTEMPTING TO QUIT You may want to talk about your answers with your  caregiver.  Why do you want to quit?  If you tried to quit in the past, what helped and what did not?  What will be the most difficult situations for you after you quit? How will you plan to handle them?  Who can help you through the tough times? Your family? Friends? A caregiver?  What pleasures do you get from smoking? What ways can you still get pleasure if you quit? Here are some questions to ask your caregiver:  How can you help me to be successful at quitting?  What medicine do you think would be best for me and how should I take it?  What should I do if I need more help?  What is smoking withdrawal like? How can I get information on withdrawal? GET READY  Set a quit date.  Change your environment by getting rid of all cigarettes, ashtrays, matches, and lighters in your home, car, or work. Do not let people smoke in your home.  Review your past attempts to quit. Think about what worked and what did not. GET SUPPORT AND ENCOURAGEMENT You have a better chance of being successful if you have help. You can get support in many ways.  Tell your family, friends, and co-workers that you are going to quit and need their support. Ask them not to smoke around you.  Get individual, group, or telephone counseling and support. Programs are available at local hospitals and health centers. Call your local health department for   information about programs in your area.  Spiritual beliefs and practices may help some smokers quit.  Download a "quit meter" on your computer to keep track of quit statistics, such as how long you have gone without smoking, cigarettes not smoked, and money saved.  Get a self-help book about quitting smoking and staying off of tobacco. LEARN NEW SKILLS AND BEHAVIORS  Distract yourself from urges to smoke. Talk to someone, go for a walk, or occupy your time with a task.  Change your normal routine. Take a different route to work. Drink tea instead of coffee.  Eat breakfast in a different place.  Reduce your stress. Take a hot bath, exercise, or read a book.  Plan something enjoyable to do every day. Reward yourself for not smoking.  Explore interactive web-based programs that specialize in helping you quit. GET MEDICINE AND USE IT CORRECTLY Medicines can help you stop smoking and decrease the urge to smoke. Combining medicine with the above behavioral methods and support can greatly increase your chances of successfully quitting smoking.  Nicotine replacement therapy helps deliver nicotine to your body without the negative effects and risks of smoking. Nicotine replacement therapy includes nicotine gum, lozenges, inhalers, nasal sprays, and skin patches. Some may be available over-the-counter and others require a prescription.  Antidepressant medicine helps people abstain from smoking, but how this works is unknown. This medicine is available by prescription.  Nicotinic receptor partial agonist medicine simulates the effect of nicotine in your brain. This medicine is available by prescription. Ask your caregiver for advice about which medicines to use and how to use them based on your health history. Your caregiver will tell you what side effects to look out for if you choose to be on a medicine or therapy. Carefully read the information on the package. Do not use any other product containing nicotine while using a nicotine replacement product.  RELAPSE OR DIFFICULT SITUATIONS Most relapses occur within the first 3 months after quitting. Do not be discouraged if you start smoking again. Remember, most people try several times before finally quitting. You may have symptoms of withdrawal because your body is used to nicotine. You may crave cigarettes, be irritable, feel very hungry, cough often, get headaches, or have difficulty concentrating. The withdrawal symptoms are only temporary. They are strongest when you first quit, but they will go away within  10 14 days. To reduce the chances of relapse, try to:  Avoid drinking alcohol. Drinking lowers your chances of successfully quitting.  Reduce the amount of caffeine you consume. Once you quit smoking, the amount of caffeine in your body increases and can give you symptoms, such as a rapid heartbeat, sweating, and anxiety.  Avoid smokers because they can make you want to smoke.  Do not let weight gain distract you. Many smokers will gain weight when they quit, usually less than 10 pounds. Eat a healthy diet and stay active. You can always lose the weight gained after you quit.  Find ways to improve your mood other than smoking. FOR MORE INFORMATION  www.smokefree.gov  Document Released: 02/13/2001 Document Revised: 08/21/2011 Document Reviewed: 05/31/2011 Exit[employer] Patient Information 2014 [employer].   Peripheral Vascular Disease Peripheral Vascular Disease (PVD), also called Peripheral Arterial Disease (PAD), is a circulation problem caused by cholesterol (atherosclerotic plaque) deposits in the arteries. PVD commonly occurs in the lower extremities (legs) but it can occur in other areas of the body, such as your arms. The cholesterol buildup in the arteries reduces blood   flow which can cause pain and other serious problems. The presence of PVD can place a person at risk for Coronary Artery Disease (CAD).  CAUSES  Causes of PVD can be many. It is usually associated with more than one risk factor such as:   High Cholesterol.  Smoking.  Diabetes.  Lack of exercise or inactivity.  High blood pressure (hypertension).  Obesity.  Family history. SYMPTOMS   When the lower extremities are affected, patients with PVD may experience:  Leg pain with exertion or physical activity. This is called INTERMITTENT CLAUDICATION. This may present as cramping or numbness with physical activity. The location of the pain is associated with the level of blockage. For example, blockage at the  abdominal level (distal abdominal aorta) may result in buttock or hip pain. Lower leg arterial blockage may result in calf pain.  As PVD becomes more severe, pain can develop with less physical activity.  In people with severe PVD, leg pain may occur at rest.  Other PVD signs and symptoms:  Leg numbness or weakness.  Coldness in the affected leg or foot, especially when compared to the other leg.  A change in leg color.  Patients with significant PVD are more prone to ulcers or sores on toes, feet or legs. These may take longer to heal or may reoccur. The ulcers or sores can become infected.  If signs and symptoms of PVD are ignored, gangrene may occur. This can result in the loss of toes or loss of an entire limb.  Not all leg pain is related to PVD. Other medical conditions can cause leg pain such as:  Blood clots (embolism) or Deep Vein Thrombosis.  Inflammation of the blood vessels (vasculitis).  Spinal stenosis. DIAGNOSIS  Diagnosis of PVD can involve several different types of tests. These can include:  Pulse Volume Recording Method (PVR). This test is simple, painless and does not involve the use of X-rays. PVR involves measuring and comparing the blood pressure in the arms and legs. An ABI (Ankle-Brachial Index) is calculated. The normal ratio of blood pressures is 1. As this number becomes smaller, it indicates more severe disease.  < 0.95  indicates significant narrowing in one or more leg vessels.  <0.8 there will usually be pain in the foot, leg or buttock with exercise.  <0.4 will usually have pain in the legs at rest.  <0.25  usually indicates limb threatening PVD.  Doppler detection of pulses in the legs. This test is painless and checks to see if you have a pulses in your legs/feet.  A dye or contrast material (a substance that highlights the blood vessels so they show up on x-ray) may be given to help your caregiver better see the arteries for the following  tests. The dye is eliminated from your body by the kidney's. Your caregiver may order blood work to check your kidney function and other laboratory values before the following tests are performed:  Magnetic Resonance Angiography (MRA). An MRA is a picture study of the blood vessels and arteries. The MRA machine uses a large magnet to produce images of the blood vessels.  Computed Tomography Angiography (CTA). A CTA is a specialized x-ray that looks at how the blood flows in your blood vessels. An IV may be inserted into your arm so contrast dye can be injected.  Angiogram. Is a procedure that uses x-rays to look at your blood vessels. This procedure is minimally invasive, meaning a small incision (cut) is made in your   groin. A small tube (catheter) is then inserted into the artery of your groin. The catheter is guided to the blood vessel or artery your caregiver wants to examine. Contrast dye is injected into the catheter. X-rays are then taken of the blood vessel or artery. After the images are obtained, the catheter is taken out. TREATMENT  Treatment of PVD involves many interventions which may include:  Lifestyle changes:  Quitting smoking.  Exercise.  Following a low fat, low cholesterol diet.  Control of diabetes.  Foot care is very important to the PVD patient. Good foot care can help prevent infection.  Medication:  Cholesterol-lowering medicine.  Blood pressure medicine.  Anti-platelet drugs.  Certain medicines may reduce symptoms of Intermittent Claudication.  Interventional/Surgical options:  Angioplasty. An Angioplasty is a procedure that inflates a balloon in the blocked artery. This opens the blocked artery to improve blood flow.  Stent Implant. A wire mesh tube (stent) is placed in the artery. The stent expands and stays in place, allowing the artery to remain open.  Peripheral Bypass Surgery. This is a surgical procedure that reroutes the blood around a blocked  artery to help improve blood flow. This type of procedure may be performed if Angioplasty or stent implants are not an option. SEEK IMMEDIATE MEDICAL CARE IF:   You develop pain or numbness in your arms or legs.  Your arm or leg turns cold, becomes blue in color.  You develop redness, warmth, swelling and pain in your arms or legs. MAKE SURE YOU:   Understand these instructions.  Will watch your condition.  Will get help right away if you are not doing well or get worse. Document Released: 03/29/2004 Document Revised: 05/14/2011 Document Reviewed: 02/24/2008 Exit[employer] Patient Information 2014 [employer].   

## 2013-01-15 NOTE — Progress Notes (Signed)
Pt reports having cramps in both calf's  Pt has poor circulation. Pt is requesting a physical. Pt needs new RX for Actos and Crestor.

## 2013-01-15 NOTE — Progress Notes (Signed)
Patient ID: [name], male   DOB: 1950/07/04, 62 y.o.   MRN: [id number] Patient Demographics  [name], is a 62 y.o. male  JYN:[id number]  [id number]  DOB - [birth year]  Chief Complaint  Patient presents with  . Follow-up        Subjective:   [name] is a 62 y.o. male here today for a follow up visit. Patient has the same complaint from before, intermittent claudication and pain on walking in both legs. He continues to smoke cigarette despite repeated counseling. He also complained of erectile dysfunction and wondering if he will but Viagra prescription. All these complaints are chronic, no new complaints today. He thinks within this pleural peripheral circulations, there has to be a medication for it.   Patient has No headache, No chest pain, No abdominal pain - No Nausea, No new weakness tingling or numbness, No Cough - SOB.  ALLERGIES: Allergies  Allergen Reactions  . Mobic [Meloxicam] Rash    PAST MEDICAL HISTORY: Past Medical History  Diagnosis Date  . Diabetes mellitus   . Hypertension   . Coronary artery disease   . Hepatitis C   . High cholesterol   . Arthritis   . Hepatitis     MEDICATIONS AT HOME: Prior to Admission medications   Medication Sig Start Date End Date Taking? Authorizing Provider  aspirin EC 81 MG tablet Take 1 tablet (81 mg total) by mouth daily. 12/19/12  Yes Nishant Dhungel, MD  atorvastatin (LIPITOR) 10 MG tablet Take 10 mg by mouth daily. Take two tablets daily   Yes Historical Provider, MD  cholecalciferol (VITAMIN D) 1000 UNITS tablet Take 1 tablet (1,000 Units total) by mouth daily. 08/20/12  Yes Alison Murray, MD  glipiZIDE (GLIPIZIDE XL) 5 MG 24 hr tablet TAKE ONE TABLET BY MOUTH DAILY 12/19/12  Yes Nishant Dhungel, MD  GLIPIZIDE XL 5 MG 24 hr tablet TAKE ONE TABLET BY MOUTH DAILY 01/01/13  Yes Dorothea Ogle, MD  glucose blood (AGAMATRIX PRESTO TEST) test strip Use as instructed 09/26/12  Yes Clanford Cyndie Mull, MD  glucose  blood (ONE TOUCH ULTRA TEST) test strip Use as instructed 12/19/12  Yes Jeanann Lewandowsky, MD  glucose blood test strip Use as instructed 08/20/12  Yes Alison Murray, MD  Lancets [facility] ULTRASOFT) lancets Use as instructed 12/24/12  Yes Jeanann Lewandowsky, MD  metFORMIN (GLUCOPHAGE-XR) 500 MG 24 hr tablet Take 1 tablet (500 mg total) by mouth 2 (two) times daily with a meal. 12/19/12  Yes Nishant Dhungel, MD  metFORMIN (GLUCOPHAGE-XR) 500 MG 24 hr tablet TAKE ONE TABLET BY MOUTH TWICE DAILY 01/01/13  Yes Dorothea Ogle, MD  quinapril (ACCUPRIL) 10 MG tablet Take 10 mg by mouth at bedtime.   Yes Historical Provider, MD  Alcohol Swabs PADS 1 Package by Does not apply route 2 (two) times daily. 08/20/12   Alison Murray, MD  cilostazol (PLETAL) 100 MG tablet Take 1 tablet (100 mg total) by mouth 2 (two) times daily. 08/20/12   Alison Murray, MD  gabapentin (NEURONTIN) 100 MG capsule Take 1 capsule (100 mg total) by mouth 3 (three) times daily. 12/19/12   Nishant Dhungel, MD  HYDROcodone-acetaminophen (NORCO/VICODIN) 5-325 MG per tablet Take 2 tablets by mouth every 4 (four) hours as needed for pain. 01/25/12   Hannah Muthersbaugh, PA-C  lisinopril (PRINIVIL,ZESTRIL) 40 MG tablet Take 1 tablet (40 mg total) by mouth daily. 12/19/12   Nishant Dhungel, MD  Multiple Vitamin (MULTIVITAMIN WITH MINERALS)  TABS Take 1 tablet by mouth daily. 08/20/12   Alison Murray, MD  NAPROXEN PO Take by mouth.    Historical Provider, MD  omeprazole (PRILOSEC) 40 MG capsule Take 1 capsule (40 mg total) by mouth daily. 12/19/12   Nishant Dhungel, MD  pantoprazole (PROTONIX) 40 MG tablet Take 1 tablet (40 mg total) by mouth daily. 09/19/12   Clanford Cyndie Mull, MD  pioglitazone (ACTOS) 30 MG tablet Take 1 tablet (30 mg total) by mouth daily. 12/19/12   Nishant Dhungel, MD  promethazine (PHENERGAN) 25 MG tablet Take 1 tablet (25 mg total) by mouth every 6 (six) hours as needed for nausea. 08/20/12   Alison Murray, MD  rosuvastatin  (CRESTOR) 20 MG tablet Take 1 tablet (20 mg total) by mouth daily. 01/15/13   Jeanann Lewandowsky, MD  sertraline (ZOLOFT) 50 MG tablet Take 1 tablet (50 mg total) by mouth daily. 08/20/12   Alison Murray, MD     Objective:   Filed Vitals:   01/15/13 1448  BP: 154/85  Pulse: 80  Temp: 98.1 F (36.7 C)  TempSrc: Oral  Resp: 16  Height: 5' 4.5" (1.638 m)  Weight: 166 lb (75.297 kg)  SpO2: 99%    Exam General appearance : Awake, alert, not in any distress. Speech Clear. Not toxic looking HEENT: Atraumatic and Normocephalic, pupils equally reactive to light and accomodation Neck: supple, no JVD. No cervical lymphadenopathy.  Chest:Good air entry bilaterally, no added sounds  CVS: S1 S2 regular, no murmurs.  Abdomen: Bowel sounds present, Non tender and not distended with no gaurding, rigidity or rebound. Extremities: B/L Lower Ext shows no edema, both legs are warm to touch. Peripheral pulses are diminished both lower limbs Neurology: Awake alert, and oriented X 3, CN II-XII intact, Non focal Skin:No Rash Wounds:N/A   Data Review   CBC No results found for this basename: WBC, HGB, HCT, PLT, MCV, MCH, MCHC, RDW, NEUTRABS, LYMPHSABS, MONOABS, EOSABS, BASOSABS, BANDABS, BANDSABD,  in the last 168 hours  Chemistries   No results found for this basename: NA, K, CL, CO2, GLUCOSE, BUN, CREATININE, GFRCGP, CALCIUM, MG, AST, ALT, ALKPHOS, BILITOT,  in the last 168 hours ------------------------------------------------------------------------------------------------------------------ No results found for this basename: HGBA1C,  in the last 72 hours ------------------------------------------------------------------------------------------------------------------ No results found for this basename: CHOL, HDL, LDLCALC, TRIG, CHOLHDL, LDLDIRECT,  in the last 72 hours ------------------------------------------------------------------------------------------------------------------ No results  found for this basename: TSH, T4TOTAL, FREET3, T3FREE, THYROIDAB,  in the last 72 hours ------------------------------------------------------------------------------------------------------------------ No results found for this basename: VITAMINB12, FOLATE, FERRITIN, TIBC, IRON, RETICCTPCT,  in the last 72 hours  Coagulation profile  No results found for this basename: INR, PROTIME,  in the last 168 hours    Assessment & Plan   Patient Active Problem List   Diagnosis Date Noted  . Arthritis 10/27/2012  . Osteoarthritis 10/27/2012  . Hepatitis C 09/19/2012  . Smoker 09/19/2012  . Claudication 09/19/2012  . Essential hypertension, benign 08/20/2012  . Diabetes 08/20/2012  . Dyslipidemia 08/20/2012  . Depression 08/20/2012  . GERD (gastroesophageal reflux disease) 08/20/2012  . PVD (peripheral vascular disease) 08/20/2012  . 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 Renal insufficiency 02/11/2011  . Anemia 02/11/2011  . GI bleed 02/11/2011     Plan:  Arterial Doppler of both lower limbs ordered  Ambulatory referral to vascular surgery  Continue Crestor and Actos as prescribed  Continue other medications as prescribed and instructed  Patient counseled extensively about smoking cessation  Patient has been counseled extensively  about nutrition and exercise  Follow up in 2 months or when necessary   The patient was given clear instructions to go to ER or return to medical center if symptoms don't improve, worsen or new problems develop. The patient verbalized understanding. The patient was told to call to get lab results if they haven't heard anything in the next week.    Jeanann Lewandowsky, MD, MHA, FACP, FAAP [facility] [place], [place] [phone]   01/15/2013, 3:28 PM

## 2013-01-19 ENCOUNTER — Ambulatory Visit (HOSPITAL_COMMUNITY)
Admission: RE | Admit: 2013-01-19 | Discharge: 2013-01-19 | Disposition: A | Discharge status: Home / Self Care | Payer: No Typology Code available for payment source | Source: Ambulatory Visit | Attending: Internal Medicine | Admitting: Internal Medicine

## 2013-01-19 DIAGNOSIS — M79609 Pain in unspecified limb: Secondary | ICD-10-CM

## 2013-01-19 DIAGNOSIS — F17200 Nicotine dependence, unspecified, uncomplicated: Secondary | ICD-10-CM | POA: Insufficient documentation

## 2013-01-19 DIAGNOSIS — I70209 Unspecified atherosclerosis of native arteries of extremities, unspecified extremity: Secondary | ICD-10-CM | POA: Insufficient documentation

## 2013-01-19 DIAGNOSIS — E119 Type 2 diabetes mellitus without complications: Secondary | ICD-10-CM | POA: Insufficient documentation

## 2013-01-19 DIAGNOSIS — I739 Peripheral vascular disease, unspecified: Secondary | ICD-10-CM

## 2013-01-19 NOTE — Progress Notes (Signed)
VASCULAR LAB PRELIMINARY  ARTERIAL  ABI completed:    RIGHT    LEFT    PRESSURE WAVEFORM  PRESSURE WAVEFORM  BRACHIAL 146 Triphasic  BRACHIAL 157 Triphasic   DP 91 Monophasic DP 128 Monophasic  AT   AT    PT 104 Monophasic PT 99 Monophasic   PER   PER    GREAT TOE  NA GREAT TOE  NA    RIGHT LEFT  ABI 0.66 0.82   IMAGING:  Right:  Moderate diffuse mixed plaque throughout.  No areas of significant stenosis.  Short segment of occlusion in the mid to distal portion with reconstitution in the distal portion.   Left:  Mild to moderate diffuse plaque throughout.  No areas of significant stenosis.    Johnna Bollier, RVT 01/19/2013, 11:36 AM

## 2013-02-10 ENCOUNTER — Encounter: Payer: Self-pay | Admitting: Vascular Surgery

## 2013-02-11 ENCOUNTER — Ambulatory Visit (INDEPENDENT_AMBULATORY_CARE_PROVIDER_SITE_OTHER): Payer: Self-pay | Admitting: Vascular Surgery

## 2013-02-11 ENCOUNTER — Encounter: Payer: Self-pay | Admitting: Vascular Surgery

## 2013-02-11 VITALS — BP 160/91 | HR 101 | Ht 64.5 in | Wt 169.1 lb

## 2013-02-11 DIAGNOSIS — I70219 Atherosclerosis of native arteries of extremities with intermittent claudication, unspecified extremity: Secondary | ICD-10-CM

## 2013-02-11 DIAGNOSIS — I2510 Atherosclerotic heart disease of native coronary artery without angina pectoris: Secondary | ICD-10-CM | POA: Insufficient documentation

## 2013-02-11 NOTE — Assessment & Plan Note (Signed)
Based on his exam, he has evidence of bilateral superficial femoral artery occlusive disease which is more significant on the right side. He appears highly motivated and walks quite a bit. He plans on quitting 03/05/2013. We have had a long discussion about the importance of tobacco cessation and I have given him the number for [place] is tobacco cessation program. We have also discussed the importance of remaining on a structured walking program. His primary care physician follows his risk factors closely including his diabetes, hypertension, and hypercholesterolemia. I have ordered follow up ABIs in 9 months and I'll see him back at that time. He knows to call sooner if he has problems. We also discussed the importance of taking good care of his feet given his diabetes. I've explained it gently we would not consider arteriography and the she developed rest pain or nonhealing ulcer.

## 2013-02-11 NOTE — Progress Notes (Signed)
[facility]  Patient name: [name] MRN: [id number] DOB: 06/23/1950 Sex: male  REASON FOR CONSULT: bilateral lower extremity claudication. Referred by Dr. Hyman Hopes.  HPI: [name] is a 62 y.o. male who describes a long history of bilateral lower extremity claudication. His symptoms have been present for over a year. He experiences pain in both calves which is brought on by ambulation and relieved with rest. I do not get any clear-cut history of rest pain although he does have some pain in his feet related to neuropathy. Denies any history of nonhealing ulcers in his feet. He walks quite a bit and actually can walk up to 6 miles at times. He previously smoked cigars but then switched to cigarettes and smoked approximately a quarter of a pack per day. He plans on quitting in 2015.  Past Medical History  Diagnosis Date  . Diabetes mellitus   . Hypertension   . Coronary artery disease   . Hepatitis C   . High cholesterol   . Arthritis   . Hepatitis    Family History  Problem Relation Age of Onset  . Stroke Mother   . Diabetes Mother   . Hypertension Mother   . Hyperlipidemia Mother   . Heart disease Father   . Hypertension Father   . Heart attack Father    SOCIAL HISTORY: History  Substance Use Topics  . Smoking status: Current Some Day Smoker -- 0.25 packs/day for 30 years    Types: Cigarettes  . Smokeless tobacco: Not on file     Comment: pt states that he will be trying to quit 03/05/2012  . 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 Mobic [Meloxicam] Rash   Current Outpatient Prescriptions  Medication Sig Dispense Refill  . aspirin EC 81 MG tablet Take 1 tablet (81 mg total) by mouth daily.  30 tablet  3  . atorvastatin (LIPITOR) 10 MG tablet Take 10 mg by mouth daily. Take two tablets daily      . cholecalciferol (VITAMIN D) 1000 UNITS tablet Take 1 tablet (1,000 Units  total) by mouth daily.  30 tablet  0  . cilostazol (PLETAL) 100 MG tablet Take 1 tablet (100 mg total) by mouth 2 (two) times daily.  60 tablet  0  . gabapentin (NEURONTIN) 100 MG capsule Take 1 capsule (100 mg total) by mouth 3 (three) times daily.  30 capsule  2  . glipiZIDE (GLIPIZIDE XL) 5 MG 24 hr tablet TAKE ONE TABLET BY MOUTH DAILY  30 tablet  3  . metFORMIN (GLUCOPHAGE-XR) 500 MG 24 hr tablet Take 1 tablet (500 mg total) by mouth 2 (two) times daily with a meal.  60 tablet  3  . pioglitazone (ACTOS) 30 MG tablet Take 1 tablet (30 mg total) by mouth daily.  90 tablet  3  . quinapril (ACCUPRIL) 10 MG tablet Take 10 mg by mouth at bedtime.      . rosuvastatin (CRESTOR) 20 MG tablet Take 1 tablet (20 mg total) by mouth daily.  90 tablet  3  . Alcohol Swabs PADS 1 Package by Does not apply route 2 (two) times daily.  100 each  3  . GLIPIZIDE XL 5 MG 24 hr tablet TAKE ONE TABLET BY MOUTH DAILY  30 tablet  2  . glucose blood (AGAMATRIX PRESTO TEST) test strip Use as instructed  100 each  12  . glucose blood (ONE TOUCH ULTRA TEST) test strip Use as instructed  100 each  12  . glucose blood test strip Use as instructed  100 each  0  . HYDROcodone-acetaminophen (NORCO/VICODIN) 5-325 MG per tablet Take 2 tablets by mouth every 4 (four) hours as needed for pain.  12 tablet  0  . Lancets (ONETOUCH ULTRASOFT) lancets Use as instructed  100 each  12  . lisinopril (PRINIVIL,ZESTRIL) 40 MG tablet Take 1 tablet (40 mg total) by mouth daily.  30 tablet  3  . metFORMIN (GLUCOPHAGE-XR) 500 MG 24 hr tablet TAKE ONE TABLET BY MOUTH TWICE DAILY  60 tablet  2  . Multiple Vitamin (MULTIVITAMIN WITH MINERALS) TABS Take 1 tablet by mouth daily.  30 tablet  0  . NAPROXEN PO Take by mouth.      [name] omeprazole (PRILOSEC) 40 MG capsule Take 1 capsule (40 mg total) by mouth daily.  30 capsule  3  . pantoprazole (PROTONIX) 40 MG tablet Take 1 tablet (40 mg total) by mouth daily.  30 tablet  3  . promethazine (PHENERGAN)  25 MG tablet Take 1 tablet (25 mg total) by mouth every 6 (six) hours as needed for nausea.  30 tablet  0  . sertraline (ZOLOFT) 50 MG tablet Take 1 tablet (50 mg total) by mouth daily.  30 tablet  0   No current facility-administered medications for this visit.   REVIEW OF SYSTEMS: Arly.Keller ] denotes positive finding; [  ] denotes negative finding  CARDIOVASCULAR:  [ ]  chest pain   [ ]  chest pressure   [ ]  palpitations   [ ]  orthopnea   [ ]  dyspnea on exertion   Arly.Keller ] claudication Bilateral. [ ]  rest pain   [ ]  DVT   [ ]  phlebitis PULMONARY:   [ ]  productive cough   [ ]  asthma   [ ]  wheezing NEUROLOGIC:   [ ]  weakness  [ ]  paresthesias  [ ]  aphasia  [ ]  amaurosis  [ ]  dizziness HEMATOLOGIC:   [ ]  bleeding problems   [ ]  clotting disorders MUSCULOSKELETAL:  [ ]  joint pain   [ ]  joint swelling [ ]  leg swelling GASTROINTESTINAL: [ ]   blood in stool  [ ]   hematemesis GENITOURINARY:  [ ]   dysuria  [ ]   hematuria PSYCHIATRIC:  [ ]  history of major depression INTEGUMENTARY:  [ ]  rashes  [ ]  ulcers CONSTITUTIONAL:  [ ]  fever   [ ]  chills  PHYSICAL EXAM: Filed Vitals:   02/11/13 1447  BP: 160/91  Pulse: 101  Height: 5' 4.5" (1.638 m)  Weight: 169 lb 1.6 oz (76.703 kg)  SpO2: 100%   Body mass index is 28.59 kg/(m^2). GENERAL: The patient is a well-nourished male, in no acute distress. The vital signs are documented above. CARDIOVASCULAR: There is a regular rate and rhythm. I do not detect carotid bruits. He has palpable radial pulses bilaterally. He has palpable femoral pulses bilaterally. He has a palpable left dorsalis pedis pulse. I cannot palpate a dorsalis pedis or posterior tibial pulse on the right. He has no significant lower extremity swelling. PULMONARY: There is good air exchange bilaterally without wheezing or rales. ABDOMEN: Soft and non-tender with normal pitched bowel sounds. I do not palpate an abdominal aortic aneurysm. MUSCULOSKELETAL: There are no major deformities or  cyanosis. NEUROLOGIC: No focal weakness or paresthesias are detected. SKIN: There are no ulcers or rashes noted. PSYCHIATRIC: The patient has a normal  affect.  DATA:  I have reviewed his records from Dr. Louis Meckel office. Arterial Doppler studies there should monophasic Doppler signals in both feet in the dorsalis pedis and posterior tibial positions. ABI on the right was 66%. ABI on the left was 82%.  The patient's records were reviewed. His hypercholesterolemia is being managed with Crestor.  MEDICAL ISSUES:  Atherosclerosis of native arteries of the extremities with intermittent claudication Based on his exam, he has evidence of bilateral superficial femoral artery occlusive disease which is more significant on the right side. He appears highly motivated and walks quite a bit. He plans on quitting 03/05/2013. We have had a long discussion about the importance of tobacco cessation and I have given him the number for [place] is tobacco cessation program. We have also discussed the importance of remaining on a structured walking program. His primary care physician follows his risk factors closely including his diabetes, hypertension, and hypercholesterolemia. I have ordered follow up ABIs in 9 months and I'll see him back at that time. He knows to call sooner if he has problems. We also discussed the importance of taking good care of his feet given his diabetes. I've explained it gently we would not consider arteriography and the she developed rest pain or nonhealing ulcer.      Ronae Noell S [facility] Beeper: [phone]

## 2013-02-12 ENCOUNTER — Encounter: Payer: Self-pay | Admitting: *Deleted

## 2013-03-17 ENCOUNTER — Ambulatory Visit: Payer: Medicare Other | Attending: Internal Medicine | Admitting: Internal Medicine

## 2013-03-17 ENCOUNTER — Encounter: Payer: Self-pay | Admitting: Internal Medicine

## 2013-03-17 VITALS — BP 146/95 | HR 102 | Temp 98.7°F | Resp 14 | Ht 64.0 in | Wt 168.4 lb

## 2013-03-17 DIAGNOSIS — I70209 Unspecified atherosclerosis of native arteries of extremities, unspecified extremity: Secondary | ICD-10-CM

## 2013-03-17 DIAGNOSIS — E119 Type 2 diabetes mellitus without complications: Secondary | ICD-10-CM

## 2013-03-17 LAB — COMPLETE METABOLIC PANEL WITH GFR
ALBUMIN: 4.4 g/dL (ref 3.5–5.2)
ALT: 79 U/L — ABNORMAL HIGH (ref 0–53)
AST: 51 U/L — ABNORMAL HIGH (ref 0–37)
Alkaline Phosphatase: 67 U/L (ref 39–117)
BUN: 14 mg/dL (ref 6–23)
CO2: 25 mEq/L (ref 19–32)
CREATININE: 0.87 mg/dL (ref 0.50–1.35)
Calcium: 9.5 mg/dL (ref 8.4–10.5)
Chloride: 100 mEq/L (ref 96–112)
GFR, Est African American: >89 mL/min
GLUCOSE: 290 mg/dL — AB (ref 70–99)
POTASSIUM: 4.2 meq/L (ref 3.5–5.3)
Sodium: 135 mEq/L (ref 135–145)
Total Bilirubin: 0.5 mg/dL (ref 0.3–1.2)
Total Protein: 7.4 g/dL (ref 6.0–8.3)

## 2013-03-17 LAB — LIPID PANEL
Cholesterol: 135 mg/dL (ref 0–200)
HDL: 37 mg/dL — ABNORMAL LOW (ref >39)
LDL Cholesterol: 70 mg/dL (ref 0–99)
TRIGLYCERIDES: 141 mg/dL (ref <150)
Total CHOL/HDL Ratio: 3.6 Ratio
VLDL: 28 mg/dL (ref 0–40)

## 2013-03-17 LAB — CBC WITH DIFFERENTIAL/PLATELET
BASOS PCT: 1 % (ref 0–1)
Basophils Absolute: 0 10*3/uL (ref 0.0–0.1)
EOS ABS: 0.3 10*3/uL (ref 0.0–0.7)
Eosinophils Relative: 5 % (ref 0–5)
HCT: 44.4 % (ref 39.0–52.0)
HEMOGLOBIN: 15.1 g/dL (ref 13.0–17.0)
LYMPHS ABS: 3.6 10*3/uL (ref 0.7–4.0)
Lymphocytes Relative: 61 % — ABNORMAL HIGH (ref 12–46)
MCH: 28.2 pg (ref 26.0–34.0)
MCHC: 34 g/dL (ref 30.0–36.0)
MCV: 83 fL (ref 78.0–100.0)
MONOS PCT: 7 % (ref 3–12)
Monocytes Absolute: 0.4 10*3/uL (ref 0.1–1.0)
Neutro Abs: 1.6 10*3/uL — ABNORMAL LOW (ref 1.7–7.7)
Neutrophils Relative %: 26 % — ABNORMAL LOW (ref 43–77)
Platelets: 244 10*3/uL (ref 150–400)
RBC: 5.35 MIL/uL (ref 4.22–5.81)
RDW: 13.7 % (ref 11.5–15.5)
WBC: 5.9 10*3/uL (ref 4.0–10.5)

## 2013-03-17 LAB — POCT GLYCOSYLATED HEMOGLOBIN (HGB A1C): Hemoglobin A1C: 9.4

## 2013-03-17 MED ORDER — GLIPIZIDE 5 MG PO TABS: 5.0000 mg | ORAL_TABLET | Freq: Two times a day (BID) | ORAL | Status: DC

## 2013-03-17 MED ORDER — METFORMIN HCL 500 MG PO TABS: 500.0000 mg | ORAL_TABLET | Freq: Two times a day (BID) | ORAL | Status: DC

## 2013-03-17 MED ORDER — NICOTINE 14 MG/24HR TD PT24: 14.0000 mg | MEDICATED_PATCH | Freq: Every day | TRANSDERMAL | Status: DC

## 2013-03-17 NOTE — Progress Notes (Signed)
Pt is here fro a f/u. Has questions on medication and possible allergy to prescription.

## 2013-03-17 NOTE — Progress Notes (Signed)
Patient ID: [name], male   DOB: 11-29-1950, 63 y.o.   MRN: [id number]   CC:  HPI: 63 year old with a history of diabetes, peripheral vascular disease, hypertension presents for followup. The patient states that he thought  he was allergic to glipizide. However his symptoms persist despite stopping glipizide and every wants to resume his glipizide and Actos. Currently is only taking metformin and her CBGs have ranged between 150-350. Denies any cardiopulmonary symptoms  Allergies  Allergen Reactions  . Mobic [Meloxicam] Rash   Past Medical History  Diagnosis Date  . Diabetes mellitus   . Hypertension   . Coronary artery disease   . Hepatitis C   . High cholesterol   . Arthritis   . Hepatitis    Current Outpatient Prescriptions on File Prior to Visit  Medication Sig Dispense Refill  . glucose blood (AGAMATRIX PRESTO TEST) test strip Use as instructed  100 each  12  . glucose blood (ONE TOUCH ULTRA TEST) test strip Use as instructed  100 each  12  . glucose blood test strip Use as instructed  100 each  0  . Alcohol Swabs PADS 1 Package by Does not apply route 2 (two) times daily.  100 each  3  . aspirin EC 81 MG tablet Take 1 tablet (81 mg total) by mouth daily.  30 tablet  3  . atorvastatin (LIPITOR) 10 MG tablet Take 10 mg by mouth daily. Take two tablets daily      . cholecalciferol (VITAMIN D) 1000 UNITS tablet Take 1 tablet (1,000 Units total) by mouth daily.  30 tablet  0  . cilostazol (PLETAL) 100 MG tablet Take 1 tablet (100 mg total) by mouth 2 (two) times daily.  60 tablet  0  . gabapentin (NEURONTIN) 100 MG capsule Take 1 capsule (100 mg total) by mouth 3 (three) times daily.  30 capsule  2  . HYDROcodone-acetaminophen (NORCO/VICODIN) 5-325 MG per tablet Take 2 tablets by mouth every 4 (four) hours as needed for pain.  12 tablet  0  . Lancets (ONETOUCH ULTRASOFT) lancets Use as instructed  100 each  12  . lisinopril (PRINIVIL,ZESTRIL) 40 MG tablet Take 1 tablet (40 mg  total) by mouth daily.  30 tablet  3  . Multiple Vitamin (MULTIVITAMIN WITH MINERALS) TABS Take 1 tablet by mouth daily.  30 tablet  0  . NAPROXEN PO Take by mouth.      [name] omeprazole (PRILOSEC) 40 MG capsule Take 1 capsule (40 mg total) by mouth daily.  30 capsule  3  . pantoprazole (PROTONIX) 40 MG tablet Take 1 tablet (40 mg total) by mouth daily.  30 tablet  3  . promethazine (PHENERGAN) 25 MG tablet Take 1 tablet (25 mg total) by mouth every 6 (six) hours as needed for nausea.  30 tablet  0  . quinapril (ACCUPRIL) 10 MG tablet Take 10 mg by mouth at bedtime.      . rosuvastatin (CRESTOR) 20 MG tablet Take 1 tablet (20 mg total) by mouth daily.  90 tablet  3  . sertraline (ZOLOFT) 50 MG tablet Take 1 tablet (50 mg total) by mouth daily.  30 tablet  0   No current facility-administered medications on file prior to visit.   Family History  Problem Relation Age of Onset  . Stroke Mother   . Diabetes Mother   . Hypertension Mother   . Hyperlipidemia Mother   . Heart disease Father   . Hypertension Father   .  Heart attack Father    History   Social History  . Marital Status: Single    Spouse Name: N/A    Number of Children: N/A  . Years of Education: N/A   Occupational History  . Not on file.   Social History Main Topics  . Smoking status: Current Some Day Smoker -- 0.25 packs/day for 30 years    Types: Cigarettes  . Smokeless tobacco: Not on file     Comment: pt states that he will be trying to quit 03/05/2012  . 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 Drug Use: No  . Sexual Activity: Not on file   Other Topics Concern  . Not on file   Social History Narrative  . No narrative on file    Review of Systems  Constitutional: Negative for fever, chills, diaphoresis, activity change, appetite change and fatigue.  HENT: Negative for ear pain, nosebleeds, congestion, facial swelling, rhinorrhea, neck pain, neck stiffness and ear discharge.    Eyes: Negative for pain, discharge, redness, itching and visual disturbance.  Respiratory: Negative for cough, choking, chest tightness, shortness of breath, wheezing and stridor.   Cardiovascular: Negative for chest pain, palpitations and leg swelling.  Gastrointestinal: Negative for abdominal distention.  Genitourinary: Negative for dysuria, urgency, frequency, hematuria, flank pain, decreased urine volume, difficulty urinating and dyspareunia.  Musculoskeletal: Negative for back pain, joint swelling, arthralgias and gait problem.  Neurological: Negative for dizziness, tremors, seizures, syncope, facial asymmetry, speech difficulty, weakness, light-headedness, numbness and headaches.  Hematological: Negative for adenopathy. Does not bruise/bleed easily.  Psychiatric/Behavioral: Negative for hallucinations, behavioral problems, confusion, dysphoric mood, decreased concentration and agitation.    Objective:   Filed Vitals:   03/17/13 1503  BP: 146/95  Pulse: 102  Temp: 98.7 F (37.1 C)  Resp: 14    Physical Exam  Constitutional: Appears well-developed and well-nourished. No distress.  HENT: Normocephalic. External right and left ear normal. Oropharynx is clear and moist.  Eyes: Conjunctivae and EOM are normal. PERRLA, no scleral icterus.  Neck: Normal ROM. Neck supple. No JVD. No tracheal deviation. No thyromegaly.  CVS: RRR, S1/S2 +, no murmurs, no gallops, no carotid bruit.  Pulmonary: Effort and breath sounds normal, no stridor, rhonchi, wheezes, rales.  Abdominal: Soft. BS +,  no distension, tenderness, rebound or guarding.  Musculoskeletal: Normal range of motion. No edema and no tenderness.  Lymphadenopathy: No lymphadenopathy noted, cervical, inguinal. Neuro: Alert. Normal reflexes, muscle tone coordination. No cranial nerve deficit. Skin: Skin is warm and dry. No rash noted. Not diaphoretic. No erythema. No pallor.  Psychiatric: Normal mood and affect. Behavior, judgment,  thought content normal.   Lab Results  Component Value Date   WBC 9.8 01/25/2012   HGB 15.2 01/25/2012   HCT 43.8 01/25/2012   MCV 85.0 01/25/2012   PLT 214 01/25/2012   Lab Results  Component Value Date   CREATININE 0.70 09/19/2012   BUN 16 09/19/2012   NA 134* 09/19/2012   K 3.9 09/19/2012   CL 99 09/19/2012   CO2 26 09/19/2012    Lab Results  Component Value Date   HGBA1C 9.1% 12/19/2012   Lipid Panel     Component Value Date/Time   CHOL 233* 08/20/2012 1556   TRIG 174* 08/20/2012 1556   HDL 42 08/20/2012 1556   CHOLHDL 5.5 08/20/2012 1556   VLDL 35 08/20/2012 1556   LDLCALC 156* 08/20/2012 1556       Assessment and  plan:   Patient Active Problem List   Diagnosis Date Noted  . Atherosclerosis of native arteries of the extremities with intermittent claudication 02/11/2013  . Arthritis 10/27/2012  . Osteoarthritis 10/27/2012  . Hepatitis C 09/19/2012  . Smoker 09/19/2012  . Claudication 09/19/2012  . Essential hypertension, benign 08/20/2012  . Diabetes 08/20/2012  . Dyslipidemia 08/20/2012  . Depression 08/20/2012  . GERD (gastroesophageal reflux disease) 08/20/2012  . PVD (peripheral vascular disease) 08/20/2012  . 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 Renal insufficiency 02/11/2011  . Anemia 02/11/2011  . GI bleed 02/11/2011    Diabetes Switch to metformin and glipizide short-acting because of cost Recheck A1c Lipid panel CMP   Hypertension Continue lisinopril Renal panel  Peripheral vascular disease continue Pletal, aspirin, nicotine patch for smoking cessation    The patient was given clear instructions to go to ER or return to medical center if symptoms don't improve, worsen or new problems develop. The patient verbalized understanding. The patient was told to call to get any lab results if not heard anything in the next week.

## 2013-03-17 NOTE — Addendum Note (Signed)
Addended by: Allyson Sabal MD, Ascencion Dike on: 03/17/2013 03:44 PM   Modules accepted: Orders

## 2013-03-18 LAB — TESTOSTERONE: Testosterone: 687 ng/dL (ref 300–890)

## 2013-03-19 ENCOUNTER — Telehealth: Payer: Self-pay | Admitting: Internal Medicine

## 2013-03-19 NOTE — Telephone Encounter (Signed)
Patient is requesting a script for erectile dysfunction

## 2013-03-19 NOTE — Telephone Encounter (Signed)
Pt forgot to mention during office visit that he needs script for True test strips, he uses [facility] pharmacy.  Also pt says he spoke to Dr about a erectile dysfunction script.  He says that Dr was worried about side effects the med might cause but pt says he was previously taking med for over 10 years with no side effects and only reason he had to stop is that insurance termed.  Please f/u with pt.

## 2013-03-20 ENCOUNTER — Telehealth: Payer: Self-pay | Admitting: *Deleted

## 2013-03-20 NOTE — Telephone Encounter (Signed)
Message copied by Arlenne Kimbley, [place] R on Fri Mar 20, 2013 12:28 PM ------      Message from: Allyson Sabal MD, [facility]      Created: Fri Mar 20, 2013 12:06 PM       Notify patient if A1c is 9.4. Diabetes is still poorly controlled. Testosterone level is normal. Cholesterol panel is normal. ------

## 2013-03-20 NOTE — Telephone Encounter (Signed)
Pt requested to schedule an appointment. Scheduled and appointment 04/13/2013 at 5pm with Dr. Annitta Needs.

## 2013-04-14 ENCOUNTER — Encounter: Payer: Self-pay | Admitting: Internal Medicine

## 2013-04-14 ENCOUNTER — Ambulatory Visit: Payer: Medicare Other | Attending: Internal Medicine | Admitting: Internal Medicine

## 2013-04-14 VITALS — BP 128/82 | HR 89 | Temp 98.6°F | Resp 16 | Ht 64.5 in | Wt 168.0 lb

## 2013-04-14 DIAGNOSIS — E7800 Pure hypercholesterolemia, unspecified: Secondary | ICD-10-CM | POA: Insufficient documentation

## 2013-04-14 DIAGNOSIS — N529 Male erectile dysfunction, unspecified: Secondary | ICD-10-CM | POA: Insufficient documentation

## 2013-04-14 DIAGNOSIS — I10 Essential (primary) hypertension: Secondary | ICD-10-CM | POA: Insufficient documentation

## 2013-04-14 DIAGNOSIS — E119 Type 2 diabetes mellitus without complications: Secondary | ICD-10-CM

## 2013-04-14 DIAGNOSIS — Z7982 Long term (current) use of aspirin: Secondary | ICD-10-CM | POA: Insufficient documentation

## 2013-04-14 DIAGNOSIS — I2510 Atherosclerotic heart disease of native coronary artery without angina pectoris: Secondary | ICD-10-CM | POA: Insufficient documentation

## 2013-04-14 LAB — GLUCOSE, POCT (MANUAL RESULT ENTRY): POC Glucose: 160 mg/dl — AB (ref 70–99)

## 2013-04-14 NOTE — Progress Notes (Signed)
Pt is stating that he is here to see if his heart is well enough to take Viagra.

## 2013-04-14 NOTE — Progress Notes (Signed)
MRN: [id number] Name: [name]  Sex: male Age: 63 y.o. DOB: 10-02-1950  Allergies: Mobic  No chief complaint on file.   HPI: Patient is 63 y.o. male who history of diabetes hypertension peripheral vascular disease comes today reported to have long-standing erectile dysfunction ? patient was taking Viagra in the past, as per patient now he has been more compliant with taking his diabetes medication and his fasting sugar is around 100 mg/dL, as per patient his endocrinologist prescribe him Viagra in the past but for the last 2 years he has been diagnosed with peripheral vascular disease, currently patient denies smoking cigarettes count denies any chest pain or shortness of breath.  Past Medical History  Diagnosis Date  . Diabetes mellitus   . Hypertension   . Coronary artery disease   . Hepatitis C   . High cholesterol   . Arthritis   . Hepatitis     Past Surgical History  Procedure Laterality Date  . Esophagogastroduodenoscopy  02/09/2011    Procedure: ESOPHAGOGASTRODUODENOSCOPY (EGD);  Surgeon: Missy Sabins, MD;  Location[facility] ENDOSCOPY;  Service: Endoscopy;  Laterality: N/A;  . Joint replacement  10/22/2009    knee      Medication List       This list is accurate as of: 04/14/13  5:45 PM.  Always use your most recent med list.               Alcohol Swabs Pads  1 Package by Does not apply route 2 (two) times daily.     aspirin EC 81 MG tablet  Take 1 tablet (81 mg total) by mouth daily.     atorvastatin 10 MG tablet  Commonly known as:  LIPITOR  Take 10 mg by mouth daily. Take two tablets daily     cholecalciferol 1000 UNITS tablet  Commonly known as:  VITAMIN D  Take 1 tablet (1,000 Units total) by mouth daily.     cilostazol 100 MG tablet  Commonly known as:  PLETAL  Take 1 tablet (100 mg total) by mouth 2 (two) times daily.     gabapentin 100 MG capsule  Commonly known as:  NEURONTIN  Take 1 capsule (100 mg total) by mouth 3 (three) times daily.      glipiZIDE 5 MG tablet  Commonly known as:  GLUCOTROL  Take 1 tablet (5 mg total) by mouth 2 (two) times daily before a meal.     glucose blood test strip  Use as instructed     glucose blood test strip  Commonly known as:  AGAMATRIX PRESTO TEST  Use as instructed     glucose blood test strip  Commonly known as:  ONE TOUCH ULTRA TEST  Use as instructed     HYDROcodone-acetaminophen 5-325 MG per tablet  Commonly known as:  NORCO/VICODIN  Take 2 tablets by mouth every 4 (four) hours as needed for pain.     lisinopril 40 MG tablet  Commonly known as:  PRINIVIL,ZESTRIL  Take 1 tablet (40 mg total) by mouth daily.     metFORMIN 500 MG tablet  Commonly known as:  GLUCOPHAGE  Take 1 tablet (500 mg total) by mouth 2 (two) times daily with a meal.     multivitamin with minerals Tabs tablet  Take 1 tablet by mouth daily.     NAPROXEN PO  Take by mouth.     nicotine 14 mg/24hr patch  Commonly known as:  EQ NICOTINE  Place 1 patch (  14 mg total) onto the skin daily.     omeprazole 40 MG capsule  Commonly known as:  PRILOSEC  Take 1 capsule (40 mg total) by mouth daily.     onetouch ultrasoft lancets  Use as instructed     pantoprazole 40 MG tablet  Commonly known as:  PROTONIX  Take 1 tablet (40 mg total) by mouth daily.     promethazine 25 MG tablet  Commonly known as:  PHENERGAN  Take 1 tablet (25 mg total) by mouth every 6 (six) hours as needed for nausea.     rosuvastatin 20 MG tablet  Commonly known as:  CRESTOR  Take 1 tablet (20 mg total) by mouth daily.     sertraline 50 MG tablet  Commonly known as:  ZOLOFT  Take 1 tablet (50 mg total) by mouth daily.        No orders of the defined types were placed in this encounter.    Immunization History  Administered Date(s) Administered  . Influenza Split 02/10/2011  . Influenza, Seasonal, Injecte, Preservative Fre 12/19/2012    Family History  Problem Relation Age of Onset  . Stroke Mother   .  Diabetes Mother   . Hypertension Mother   . Hyperlipidemia Mother   . Heart disease Father   . Hypertension Father   . Heart attack Father     History  Substance Use Topics  . Smoking status: Former Smoker -- 0.25 packs/day for 30 years    Types: Cigarettes    Quit date: 03/12/2013  . Smokeless tobacco: Not on file     Comment: pt states that he will be trying to quit 03/05/2012  . 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  Eyes: EOM are normal. Pupils are equal, round, and reactive to light.  Cardiovascular: Normal rate and regular rhythm.   Pulmonary/Chest: No respiratory distress. He has no wheezes. He has no rales.  Genitourinary:  Testes nontender no penile discharge or any rash.    CBC    Component Value Date/Time   WBC 5.9 03/17/2013 1529   RBC 5.35 03/17/2013 1529   HGB 15.1 03/17/2013 1529   HCT 44.4 03/17/2013 1529   PLT 244 03/17/2013 1529   MCV 83.0 03/17/2013 1529   LYMPHSABS 3.6 03/17/2013 1529   MONOABS 0.4 03/17/2013 1529   EOSABS 0.3 03/17/2013 1529   BASOSABS 0.0 03/17/2013 1529    CMP     Component Value Date/Time   NA 135 03/17/2013 1529   K 4.2 03/17/2013 1529   CL 100 03/17/2013 1529   CO2 25 03/17/2013 1529   GLUCOSE 290* 03/17/2013 1529   BUN 14 03/17/2013 1529   CREATININE 0.87 03/17/2013 1529   CREATININE 0.60 01/25/2012 1231   CALCIUM 9.5 03/17/2013 1529   PROT 7.4 03/17/2013 1529   ALBUMIN 4.4 03/17/2013 1529   AST 51* 03/17/2013 1529   ALT 79* 03/17/2013 1529   ALKPHOS 67 03/17/2013 1529   BILITOT 0.5 03/17/2013 1529   GFRNONAA >90 01/25/2012 1231   GFRAA >90 01/25/2012 1231    Lab Results  Component Value Date/Time   CHOL 135 03/17/2013  3:29 PM    No components found  with this basename: hga1c    Lab Results    Component Value Date/Time   AST 51* 03/17/2013  3:29 PM    Assessment and Plan  Diabetes - Plan: Glucose (CBG) 160mg /dL, patient is going to continue with current regimen already has scheduled appointment to be checked for A1c.  ED (erectile dysfunction) - Plan: Patient has several factors I have given Ambulatory referral to Urology for further evaluation and management.    Follow up as scheduled.  Lorayne Marek, MD

## 2013-05-15 ENCOUNTER — Ambulatory Visit: Payer: Medicare Other | Admitting: Internal Medicine

## 2013-05-18 ENCOUNTER — Encounter: Payer: Self-pay | Admitting: Internal Medicine

## 2013-05-18 ENCOUNTER — Ambulatory Visit: Payer: Medicare Other | Attending: Internal Medicine | Admitting: Internal Medicine

## 2013-05-18 VITALS — BP 138/94 | HR 70 | Temp 98.4°F | Resp 17 | Wt 172.0 lb

## 2013-05-18 DIAGNOSIS — K219 Gastro-esophageal reflux disease without esophagitis: Secondary | ICD-10-CM

## 2013-05-18 DIAGNOSIS — I10 Essential (primary) hypertension: Secondary | ICD-10-CM | POA: Insufficient documentation

## 2013-05-18 DIAGNOSIS — E7800 Pure hypercholesterolemia, unspecified: Secondary | ICD-10-CM | POA: Insufficient documentation

## 2013-05-18 DIAGNOSIS — Z79899 Other long term (current) drug therapy: Secondary | ICD-10-CM | POA: Insufficient documentation

## 2013-05-18 DIAGNOSIS — B1920 Unspecified viral hepatitis C without hepatic coma: Secondary | ICD-10-CM | POA: Insufficient documentation

## 2013-05-18 DIAGNOSIS — Z96659 Presence of unspecified artificial knee joint: Secondary | ICD-10-CM | POA: Insufficient documentation

## 2013-05-18 DIAGNOSIS — I70209 Unspecified atherosclerosis of native arteries of extremities, unspecified extremity: Secondary | ICD-10-CM

## 2013-05-18 DIAGNOSIS — I2510 Atherosclerotic heart disease of native coronary artery without angina pectoris: Secondary | ICD-10-CM | POA: Insufficient documentation

## 2013-05-18 DIAGNOSIS — E119 Type 2 diabetes mellitus without complications: Secondary | ICD-10-CM | POA: Insufficient documentation

## 2013-05-18 DIAGNOSIS — R21 Rash and other nonspecific skin eruption: Secondary | ICD-10-CM | POA: Insufficient documentation

## 2013-05-18 DIAGNOSIS — Z87891 Personal history of nicotine dependence: Secondary | ICD-10-CM | POA: Insufficient documentation

## 2013-05-18 DIAGNOSIS — R062 Wheezing: Secondary | ICD-10-CM | POA: Insufficient documentation

## 2013-05-18 MED ORDER — METHYLPREDNISOLONE 4 MG PO KIT: PACK | ORAL | Status: DC

## 2013-05-18 MED ORDER — HYDROCORTISONE 2.5 % EX CREA: TOPICAL_CREAM | Freq: Two times a day (BID) | CUTANEOUS | Status: DC

## 2013-05-18 MED ORDER — PANTOPRAZOLE SODIUM 40 MG PO TBEC: 40.0000 mg | DELAYED_RELEASE_TABLET | Freq: Every day | ORAL | Status: DC

## 2013-05-18 MED ORDER — CETIRIZINE HCL 10 MG PO CAPS: 1.0000 | ORAL_CAPSULE | Freq: Every day | ORAL | Status: DC

## 2013-05-18 NOTE — Progress Notes (Signed)
MRN: [id number] Name: [name]  Sex: male Age: 63 y.o. DOB: 12-19-1950  Allergies: Mobic  Chief Complaint  [name] presents with  . Wheezing    HPI: [name] is 62 y.o. male who comes today reported to have rash on his body and itching all over for the last 2 months, denies any change in soap detergent medication, denies any fever chills, reported to have some wheezing denies any chest pain or shortness of breath, [name] has tried to change his detergent powder which helped some. Itching is all day,, denies any itching in the hands.   Past Medical History  Diagnosis Date  . Diabetes mellitus   . Hypertension   . Coronary artery disease   . Hepatitis C   . High cholesterol   . Arthritis   . Hepatitis     Past Surgical History  Procedure Laterality Date  . Esophagogastroduodenoscopy  02/09/2011    Procedure: ESOPHAGOGASTRODUODENOSCOPY (EGD);  Surgeon: Missy Sabins, MD;  Location: [facility] ENDOSCOPY;  Service: Endoscopy;  Laterality: N/A;  . Joint replacement  10/22/2009    knee      Medication List       This list is accurate as of: 05/18/13  4:49 PM.  Always use your most recent med list.               Alcohol Swabs Pads  1 Package by Does not apply route 2 (two) times daily.     aspirin EC 81 MG tablet  Take 1 tablet (81 mg total) by mouth daily.     atorvastatin 10 MG tablet  Commonly known as:  LIPITOR  Take 10 mg by mouth daily. Take two tablets daily     Cetirizine HCl 10 MG Caps  Commonly known as:  ZYRTEC ALLERGY  Take 1 capsule (10 mg total) by mouth at bedtime.     cholecalciferol 1000 UNITS tablet  Commonly known as:  VITAMIN D  Take 1 tablet (1,000 Units total) by mouth daily.     cilostazol 100 MG tablet  Commonly known as:  PLETAL  Take 1 tablet (100 mg total) by mouth 2 (two) times daily.     gabapentin 100 MG capsule  Commonly known as:  NEURONTIN  Take 1 capsule (100 mg total) by mouth 3 (three) times daily.     glipiZIDE 5 MG  tablet  Commonly known as:  GLUCOTROL  Take 1 tablet (5 mg total) by mouth 2 (two) times daily before a meal.     glucose blood test strip  Use as instructed     glucose blood test strip  Commonly known as:  AGAMATRIX PRESTO TEST  Use as instructed     glucose blood test strip  Commonly known as:  ONE TOUCH ULTRA TEST  Use as instructed     HYDROcodone-acetaminophen 5-325 MG per tablet  Commonly known as:  NORCO/VICODIN  Take 2 tablets by mouth every 4 (four) hours as needed for pain.     hydrocortisone 2.5 % cream  Apply topically 2 (two) times daily.     lisinopril 40 MG tablet  Commonly known as:  PRINIVIL,ZESTRIL  Take 1 tablet (40 mg total) by mouth daily.     metFORMIN 500 MG tablet  Commonly known as:  GLUCOPHAGE  Take 1 tablet (500 mg total) by mouth 2 (two) times daily with a meal.     methylPREDNISolone 4 MG tablet  Commonly known as:  MEDROL DOSEPAK  follow package  directions     multivitamin with minerals Tabs tablet  Take 1 tablet by mouth daily.     NAPROXEN PO  Take by mouth.     nicotine 14 mg/24hr patch  Commonly known as:  EQ NICOTINE  Place 1 patch (14 mg total) onto the skin daily.     omeprazole 40 MG capsule  Commonly known as:  PRILOSEC  Take 1 capsule (40 mg total) by mouth daily.     onetouch ultrasoft lancets  Use as instructed     pantoprazole 40 MG tablet  Commonly known as:  PROTONIX  Take 1 tablet (40 mg total) by mouth daily.     promethazine 25 MG tablet  Commonly known as:  PHENERGAN  Take 1 tablet (25 mg total) by mouth every 6 (six) hours as needed for nausea.     rosuvastatin 20 MG tablet  Commonly known as:  CRESTOR  Take 1 tablet (20 mg total) by mouth daily.     sertraline 50 MG tablet  Commonly known as:  ZOLOFT  Take 1 tablet (50 mg total) by mouth daily.        Meds ordered this encounter  Medications  . Cetirizine HCl (ZYRTEC ALLERGY) 10 MG CAPS    Sig: Take 1 capsule (10 mg total) by mouth at bedtime.     Dispense:  30 capsule    Refill:  1  . DISCONTD: methylPREDNISolone (MEDROL DOSEPAK) 4 MG tablet    Sig: follow package directions    Dispense:  21 tablet    Refill:  0  . hydrocortisone 2.5 % cream    Sig: Apply topically 2 (two) times daily.    Dispense:  30 g    Refill:  0  . pantoprazole (PROTONIX) 40 MG tablet    Sig: Take 1 tablet (40 mg total) by mouth daily.    Dispense:  30 tablet    Refill:  3  . methylPREDNISolone (MEDROL DOSEPAK) 4 MG tablet    Sig: follow package directions    Dispense:  21 tablet    Refill:  0    Immunization History  Administered Date(s) Administered  . Influenza Split 02/10/2011  . Influenza, Seasonal, Injecte, Preservative Fre 12/19/2012    Family History  Problem Relation Age of Onset  . Stroke Mother   . Diabetes Mother   . Hypertension Mother   . Hyperlipidemia Mother   . Heart disease Father   . Hypertension Father   . Heart attack Father     History  Substance Use Topics  . Smoking status: Former Smoker -- 0.25 packs/day for 30 years    Types: Cigarettes    Quit date: 03/12/2013  . Smokeless tobacco: Not on file     Comment: pt states that he will be trying to quit 03/05/2012  . 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  Eyes: Conjunctivae and EOM are normal. Pupils are equal, round, and reactive to light.  Cardiovascular: Normal rate and regular rhythm.   Pulmonary/Chest: Breath sounds normal. No respiratory distress. He has no wheezes. He has no rales.  Skin:  Rash on the arms , abdomen and back    CBC  Component Value Date/Time   WBC 5.9 03/17/2013 1529   RBC 5.35 03/17/2013 1529   HGB 15.1 03/17/2013 1529   HCT 44.4 03/17/2013 1529   PLT 244 03/17/2013 1529   MCV 83.0 03/17/2013 1529    LYMPHSABS 3.6 03/17/2013 1529   MONOABS 0.4 03/17/2013 1529   EOSABS 0.3 03/17/2013 1529   BASOSABS 0.0 03/17/2013 1529    CMP     Component Value Date/Time   NA 135 03/17/2013 1529   K 4.2 03/17/2013 1529   CL 100 03/17/2013 1529   CO2 25 03/17/2013 1529   GLUCOSE 290* 03/17/2013 1529   BUN 14 03/17/2013 1529   CREATININE 0.87 03/17/2013 1529   CREATININE 0.60 01/25/2012 1231   CALCIUM 9.5 03/17/2013 1529   PROT 7.4 03/17/2013 1529   ALBUMIN 4.4 03/17/2013 1529   AST 51* 03/17/2013 1529   ALT 79* 03/17/2013 1529   ALKPHOS 67 03/17/2013 1529   BILITOT 0.5 03/17/2013 1529   GFRNONAA >90 01/25/2012 1231   GFRAA >90 01/25/2012 1231    Lab Results  Component Value Date/Time   CHOL 135 03/17/2013  3:29 PM    No components found with this basename: hga1c    Lab Results  Component Value Date/Time   AST 51* 03/17/2013  3:29 PM    Assessment and Plan  Rash and nonspecific skin eruption - Plan: Cetirizine HCl (ZYRTEC ALLERGY) 10 MG CAPS, hydrocortisone 2.5 % cream, methylPREDNISolone (MEDROL DOSEPAK) 4 MG tablet, GERD (gastroesophageal reflux disease)  GERD [name] will continue with Protonix.   Follow up as scheduled  Lorayne Marek, MD

## 2013-05-18 NOTE — Progress Notes (Signed)
Patient here for itching problem States itches all over Taking benadryl with no relief Stopped smoking 61 days ago

## 2013-06-04 ENCOUNTER — Other Ambulatory Visit: Payer: Self-pay | Admitting: *Deleted

## 2013-06-04 DIAGNOSIS — E1110 Type 2 diabetes mellitus with ketoacidosis without coma: Secondary | ICD-10-CM

## 2013-06-04 MED ORDER — GLUCOSE BLOOD VI STRP: ORAL_STRIP | Status: DC

## 2013-06-08 ENCOUNTER — Other Ambulatory Visit: Payer: Self-pay | Admitting: *Deleted

## 2013-06-08 ENCOUNTER — Telehealth: Payer: Self-pay

## 2013-06-08 DIAGNOSIS — E1110 Type 2 diabetes mellitus with ketoacidosis without coma: Secondary | ICD-10-CM

## 2013-06-08 MED ORDER — GLUCOSE BLOOD VI STRP: ORAL_STRIP | Status: DC

## 2013-06-08 NOTE — Telephone Encounter (Signed)
Spoke with patient -had bad connection on phone  Patient will call back later with the names of the medications He needs refills on

## 2013-06-09 ENCOUNTER — Telehealth: Payer: Self-pay

## 2013-06-09 ENCOUNTER — Other Ambulatory Visit: Payer: Self-pay | Admitting: *Deleted

## 2013-06-09 DIAGNOSIS — E1110 Type 2 diabetes mellitus with ketoacidosis without coma: Secondary | ICD-10-CM

## 2013-06-09 MED ORDER — TRUEPLUS LANCETS 28G MISC: Status: DC

## 2013-06-09 NOTE — Telephone Encounter (Signed)
Patient called from [facility] Stating needs new prescription for glucometer Must be one medicare will cover He will speak with the pharmacist and get back to [place]

## 2013-06-22 ENCOUNTER — Ambulatory Visit: Payer: Medicare Other | Attending: Internal Medicine

## 2013-06-22 DIAGNOSIS — Z0000 Encounter for general adult medical examination without abnormal findings: Secondary | ICD-10-CM

## 2013-06-22 DIAGNOSIS — E119 Type 2 diabetes mellitus without complications: Secondary | ICD-10-CM

## 2013-06-22 LAB — GLUCOSE, POCT (MANUAL RESULT ENTRY): POC Glucose: 264 mg/dl — AB (ref 70–99)

## 2013-06-22 LAB — POCT GLYCOSYLATED HEMOGLOBIN (HGB A1C): HEMOGLOBIN A1C: 9.6

## 2013-06-23 ENCOUNTER — Other Ambulatory Visit: Payer: Self-pay

## 2013-06-23 ENCOUNTER — Telehealth: Payer: Self-pay | Admitting: Emergency Medicine

## 2013-06-23 ENCOUNTER — Telehealth: Payer: Self-pay

## 2013-06-23 MED ORDER — GLUCOSE BLOOD VI STRP: ORAL_STRIP | Status: DC

## 2013-06-23 MED ORDER — GLIPIZIDE 5 MG PO TABS: 5.0000 mg | ORAL_TABLET | Freq: Two times a day (BID) | ORAL | Status: DC

## 2013-06-23 MED ORDER — ONETOUCH ULTRASOFT LANCETS MISC: Status: DC

## 2013-06-23 NOTE — Telephone Encounter (Signed)
Patient called needing refills on his diabetic lancets and strips and glipizide Prescriptions e-scribed to [facility] on file

## 2013-06-24 ENCOUNTER — Telehealth: Payer: Self-pay

## 2013-06-24 NOTE — Telephone Encounter (Signed)
Patient called stating there was a problem with the scripts that were e-scribed  To his pharmacy-i called [facility] on [address] to clarify that the patient was in need of strips For his glucometer and lancets and the frequency of use

## 2013-06-26 ENCOUNTER — Encounter: Payer: Self-pay | Admitting: Internal Medicine

## 2013-06-26 ENCOUNTER — Ambulatory Visit: Payer: Medicare Other | Attending: Internal Medicine | Admitting: Internal Medicine

## 2013-06-26 VITALS — BP 122/78 | HR 70 | Temp 99.2°F | Resp 16

## 2013-06-26 DIAGNOSIS — Z09 Encounter for follow-up examination after completed treatment for conditions other than malignant neoplasm: Secondary | ICD-10-CM | POA: Insufficient documentation

## 2013-06-26 DIAGNOSIS — E1165 Type 2 diabetes mellitus with hyperglycemia: Secondary | ICD-10-CM

## 2013-06-26 DIAGNOSIS — I70209 Unspecified atherosclerosis of native arteries of extremities, unspecified extremity: Secondary | ICD-10-CM

## 2013-06-26 DIAGNOSIS — R062 Wheezing: Secondary | ICD-10-CM

## 2013-06-26 DIAGNOSIS — Z87891 Personal history of nicotine dependence: Secondary | ICD-10-CM | POA: Insufficient documentation

## 2013-06-26 DIAGNOSIS — E785 Hyperlipidemia, unspecified: Secondary | ICD-10-CM

## 2013-06-26 DIAGNOSIS — E119 Type 2 diabetes mellitus without complications: Secondary | ICD-10-CM

## 2013-06-26 DIAGNOSIS — I2510 Atherosclerotic heart disease of native coronary artery without angina pectoris: Secondary | ICD-10-CM | POA: Insufficient documentation

## 2013-06-26 DIAGNOSIS — I10 Essential (primary) hypertension: Secondary | ICD-10-CM

## 2013-06-26 DIAGNOSIS — R21 Rash and other nonspecific skin eruption: Secondary | ICD-10-CM

## 2013-06-26 DIAGNOSIS — IMO0001 Reserved for inherently not codable concepts without codable children: Secondary | ICD-10-CM | POA: Insufficient documentation

## 2013-06-26 DIAGNOSIS — K219 Gastro-esophageal reflux disease without esophagitis: Secondary | ICD-10-CM

## 2013-06-26 MED ORDER — ALBUTEROL SULFATE HFA 108 (90 BASE) MCG/ACT IN AERS: 2.0000 | INHALATION_SPRAY | Freq: Four times a day (QID) | RESPIRATORY_TRACT | Status: DC | PRN

## 2013-06-26 MED ORDER — GLIPIZIDE 5 MG PO TABS: 5.0000 mg | ORAL_TABLET | Freq: Two times a day (BID) | ORAL | Status: DC

## 2013-06-26 MED ORDER — PERMETHRIN 5 % EX CREA: 1.0000 "application " | TOPICAL_CREAM | Freq: Once | CUTANEOUS | Status: DC

## 2013-06-26 MED ORDER — PANTOPRAZOLE SODIUM 40 MG PO TBEC: 40.0000 mg | DELAYED_RELEASE_TABLET | Freq: Every day | ORAL | Status: DC

## 2013-06-26 MED ORDER — CEPHALEXIN 500 MG PO CAPS: 500.0000 mg | ORAL_CAPSULE | Freq: Four times a day (QID) | ORAL | Status: DC

## 2013-06-26 MED ORDER — LISINOPRIL 40 MG PO TABS: 40.0000 mg | ORAL_TABLET | Freq: Every day | ORAL | Status: DC

## 2013-06-26 MED ORDER — METFORMIN HCL 1000 MG PO TABS: 1000.0000 mg | ORAL_TABLET | Freq: Two times a day (BID) | ORAL | Status: DC

## 2013-06-26 MED ORDER — ROSUVASTATIN CALCIUM 20 MG PO TABS: 20.0000 mg | ORAL_TABLET | Freq: Every day | ORAL | Status: DC

## 2013-06-26 NOTE — Progress Notes (Signed)
Patient here for follow up-DM Needs medication refilled

## 2013-06-26 NOTE — Progress Notes (Signed)
MRN: [id number] Name: [name]  Sex: male Age: 63 y.o. DOB: 1950-12-15  Allergies: Mobic  Chief Complaint  Patient presents with  . Follow-up    HPI: Patient is 63 y.o. male who has history of diabetes hypertension hyperlipidemia, GERD comes today for followup as per patient he has been taking his medications and is requesting refill on the medications, she still complains of the rash on his arms and legs, he was prescribed her Medrol Pak and anti-allergy medication as per patient has not had any much improvement, as per patient itching is all day and night denies any itching in the hands, patient also reported to have lot of wheezing and shortness of breath, patient used to smoke in the past denies any fever chills or any cough, history of hypertension well controlled. Patient denies any hypoglycemic symptoms his hemoglobin A1c is trending up he's taking Glucotrol 5 mg twice a day as well as metformin 500 mg twice a day.  Past Medical History  Diagnosis Date  . Diabetes mellitus   . Hypertension   . Coronary artery disease   . Hepatitis C   . High cholesterol   . Arthritis   . Hepatitis     Past Surgical History  Procedure Laterality Date  . Esophagogastroduodenoscopy  02/09/2011    Procedure: ESOPHAGOGASTRODUODENOSCOPY (EGD);  Surgeon: Missy Sabins, MD;  Location: [facility] ENDOSCOPY;  Service: Endoscopy;  Laterality: N/A;  . Joint replacement  10/22/2009    knee      Medication List       This list is accurate as of: 06/26/13 12:32 PM.  Always use your most recent med list.               albuterol 108 (90 BASE) MCG/ACT inhaler  Commonly known as:  PROVENTIL HFA;VENTOLIN HFA  Inhale 2 puffs into the lungs every 6 (six) hours as needed for wheezing or shortness of breath.     Alcohol Swabs Pads  1 Package by Does not apply route 2 (two) times daily.     aspirin EC 81 MG tablet  Take 1 tablet (81 mg total) by mouth daily.     atorvastatin 10 MG tablet  Commonly  known as:  LIPITOR  Take 10 mg by mouth daily. Take two tablets daily     cephALEXin 500 MG capsule  Commonly known as:  KEFLEX  Take 1 capsule (500 mg total) by mouth 4 (four) times daily.     Cetirizine HCl 10 MG Caps  Commonly known as:  ZYRTEC ALLERGY  Take 1 capsule (10 mg total) by mouth at bedtime.     cholecalciferol 1000 UNITS tablet  Commonly known as:  VITAMIN D  Take 1 tablet (1,000 Units total) by mouth daily.     cilostazol 100 MG tablet  Commonly known as:  PLETAL  Take 1 tablet (100 mg total) by mouth 2 (two) times daily.     gabapentin 100 MG capsule  Commonly known as:  NEURONTIN  Take 1 capsule (100 mg total) by mouth 3 (three) times daily.     glipiZIDE 5 MG tablet  Commonly known as:  GLUCOTROL  Take 1 tablet (5 mg total) by mouth 2 (two) times daily before a meal.     glucose blood test strip  Commonly known as:  AGAMATRIX PRESTO TEST  Use as instructed     glucose blood test strip  Commonly known as:  ONE TOUCH ULTRA TEST  Use as  instructed     glucose blood test strip  Commonly known as:  TRUETEST TEST  Use as instructed     glucose blood test strip  Use as instructed     HYDROcodone-acetaminophen 5-325 MG per tablet  Commonly known as:  NORCO/VICODIN  Take 2 tablets by mouth every 4 (four) hours as needed for pain.     hydrocortisone 2.5 % cream  Apply topically 2 (two) times daily.     lisinopril 40 MG tablet  Commonly known as:  PRINIVIL,ZESTRIL  Take 1 tablet (40 mg total) by mouth daily.     metFORMIN 1000 MG tablet  Commonly known as:  GLUCOPHAGE  Take 1 tablet (1,000 mg total) by mouth 2 (two) times daily with a meal.     methylPREDNISolone 4 MG tablet  Commonly known as:  MEDROL DOSEPAK  follow package directions     multivitamin with minerals Tabs tablet  Take 1 tablet by mouth daily.     NAPROXEN PO  Take by mouth.     nicotine 14 mg/24hr patch  Commonly known as:  EQ NICOTINE  Place 1 patch (14 mg total) onto the  skin daily.     omeprazole 40 MG capsule  Commonly known as:  PRILOSEC  Take 1 capsule (40 mg total) by mouth daily.     pantoprazole 40 MG tablet  Commonly known as:  PROTONIX  Take 1 tablet (40 mg total) by mouth daily.     permethrin 5 % cream  Commonly known as:  ACTICIN  Apply 1 application topically once.     promethazine 25 MG tablet  Commonly known as:  PHENERGAN  Take 1 tablet (25 mg total) by mouth every 6 (six) hours as needed for nausea.     rosuvastatin 20 MG tablet  Commonly known as:  CRESTOR  Take 1 tablet (20 mg total) by mouth daily.     sertraline 50 MG tablet  Commonly known as:  ZOLOFT  Take 1 tablet (50 mg total) by mouth daily.     TRUEPLUS LANCETS 28G Misc  Use as directed.  Test up to 4 times daily.  Diag code 250.12 Type 2 Diabetes     onetouch ultrasoft lancets  Use as instructed        Meds ordered this encounter  Medications  . glipiZIDE (GLUCOTROL) 5 MG tablet    Sig: Take 1 tablet (5 mg total) by mouth 2 (two) times daily before a meal.    Dispense:  60 tablet    Refill:  3  . metFORMIN (GLUCOPHAGE) 1000 MG tablet    Sig: Take 1 tablet (1,000 mg total) by mouth 2 (two) times daily with a meal.    Dispense:  180 tablet    Refill:  3  . pantoprazole (PROTONIX) 40 MG tablet    Sig: Take 1 tablet (40 mg total) by mouth daily.    Dispense:  30 tablet    Refill:  3  . rosuvastatin (CRESTOR) 20 MG tablet    Sig: Take 1 tablet (20 mg total) by mouth daily.    Dispense:  90 tablet    Refill:  3  . albuterol (PROVENTIL HFA;VENTOLIN HFA) 108 (90 BASE) MCG/ACT inhaler    Sig: Inhale 2 puffs into the lungs every 6 (six) hours as needed for wheezing or shortness of breath.    Dispense:  1 Inhaler    Refill:  2  . cephALEXin (KEFLEX) 500 MG capsule    Sig:  Take 1 capsule (500 mg total) by mouth 4 (four) times daily.    Dispense:  28 capsule    Refill:  0  . permethrin (ACTICIN) 5 % cream    Sig: Apply 1 application topically once.     Dispense:  60 g    Refill:  1  . lisinopril (PRINIVIL,ZESTRIL) 40 MG tablet    Sig: Take 1 tablet (40 mg total) by mouth daily.    Dispense:  30 tablet    Refill:  3    Immunization History  Administered Date(s) Administered  . Influenza Split 02/10/2011  . Influenza, Seasonal, Injecte, Preservative Fre 12/19/2012    Family History  Problem Relation Age of Onset  . Stroke Mother   . Diabetes Mother   . Hypertension Mother   . Hyperlipidemia Mother   . Heart disease Father   . Hypertension Father   . Heart attack Father     History  Substance Use Topics  . Smoking status: Former Smoker -- 0.25 packs/day for 30 years    Types: Cigarettes    Quit date: 03/12/2013  . Smokeless tobacco: Not on file     Comment: pt states that he will be trying to quit 03/05/2012  . 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  HENT:  Head: Normocephalic and atraumatic.  Eyes: EOM are normal. Pupils are equal, round, and reactive to light.  Cardiovascular: Normal rate and regular rhythm.   Pulmonary/Chest: Breath sounds normal. No respiratory distress. He has no rales.  Minimal wheezing  Musculoskeletal: He exhibits no edema.  Skin:  Rash on the arms, back, abdomen, legs    CBC    Component Value Date/Time   WBC 5.9 03/17/2013 1529   RBC 5.35 03/17/2013 1529   HGB 15.1 03/17/2013 1529   HCT 44.4 03/17/2013 1529   PLT 244 03/17/2013 1529   MCV 83.0 03/17/2013 1529   LYMPHSABS 3.6 03/17/2013 1529   MONOABS 0.4 03/17/2013 1529   EOSABS 0.3 03/17/2013 1529   BASOSABS 0.0 03/17/2013 1529    CMP     Component Value Date/Time   NA 135 03/17/2013 1529   K 4.2 03/17/2013 1529   CL 100 03/17/2013 1529   CO2 25 03/17/2013 1529   GLUCOSE 290* 03/17/2013 1529   BUN 14  03/17/2013 1529   CREATININE 0.87 03/17/2013 1529   CREATININE 0.60 01/25/2012 1231   CALCIUM 9.5 03/17/2013 1529   PROT 7.4 03/17/2013 1529   ALBUMIN 4.4 03/17/2013 1529   AST 51* 03/17/2013 1529   ALT 79* 03/17/2013 1529   ALKPHOS 67 03/17/2013 1529   BILITOT 0.5 03/17/2013 1529   GFRNONAA >89 03/17/2013 1529   GFRNONAA >90 01/25/2012 1231   GFRAA >89 03/17/2013 1529   GFRAA >90 01/25/2012 1231    Lab Results  Component Value Date/Time   CHOL 135 03/17/2013  3:29 PM    No components found with this basename: hga1c    Lab Results  Component Value Date/Time   AST 51* 03/17/2013  3:29 PM    Assessment and Plan  Diabetes/uncontrolled - Plan: Results for orders placed in visit on 06/22/13  GLUCOSE, POCT (MANUAL RESULT ENTRY)  Result Value Ref Range   POC Glucose 264 (*) 70 - 99 mg/dl  POCT GLYCOSYLATED HEMOGLOBIN (HGB A1C)      Result Value Ref Range   Hemoglobin A1C 9.6     Continue with  glipiZIDE (GLUCOTROL) 5 MG tablet twice a day, I have increased the dose of metformin to 1 g twice a day metFORMIN (GLUCOPHAGE) 1000 MG tablet, advised patient to keep the fingerstick log.  Dyslipidemia - Plan: Will repeat lipid panel on the next visit  continue with rosuvastatin (CRESTOR) 20 MG tablet  Essential hypertension, benign - Plan: Well controlled continue with lisinopril (PRINIVIL,ZESTRIL) 40 MG tablet  GERD (gastroesophageal reflux disease) - Plan: pantoprazole (PROTONIX) 40 MG tablet  Wheezing - Plan: albuterol (PROVENTIL HFA;VENTOLIN HFA) 108 (90 BASE) MCG/ACT inhaler  Rash and nonspecific skin eruption - Plan: Patient has tried Medrol Pak and topical hydrocortisone had not had much improvement,? Bug bites will prescribe cephALEXin (KEFLEX) 500 MG capsule, as well as permethrin (ACTICIN) 5 % cream for possible scabies. If patient is not improved will consider referral to dermatology   Return in about 3 months (around 09/25/2013) for diabetes, hypertension,  hyperipidemia.  Lorayne Marek, MD

## 2013-06-26 NOTE — Patient Instructions (Addendum)

## 2013-07-29 ENCOUNTER — Encounter (HOSPITAL_COMMUNITY): Payer: Self-pay | Admitting: Emergency Medicine

## 2013-07-29 ENCOUNTER — Emergency Department (HOSPITAL_COMMUNITY)
Admission: EM | Admit: 2013-07-29 | Discharge: 2013-07-29 | Disposition: A | Discharge status: Home / Self Care | Payer: Medicare Other | Attending: Emergency Medicine | Admitting: Emergency Medicine

## 2013-07-29 DIAGNOSIS — Z7982 Long term (current) use of aspirin: Secondary | ICD-10-CM | POA: Insufficient documentation

## 2013-07-29 DIAGNOSIS — Z87891 Personal history of nicotine dependence: Secondary | ICD-10-CM | POA: Insufficient documentation

## 2013-07-29 DIAGNOSIS — Z792 Long term (current) use of antibiotics: Secondary | ICD-10-CM | POA: Insufficient documentation

## 2013-07-29 DIAGNOSIS — L2989 Other pruritus: Secondary | ICD-10-CM | POA: Insufficient documentation

## 2013-07-29 DIAGNOSIS — M129 Arthropathy, unspecified: Secondary | ICD-10-CM | POA: Insufficient documentation

## 2013-07-29 DIAGNOSIS — Z8619 Personal history of other infectious and parasitic diseases: Secondary | ICD-10-CM | POA: Insufficient documentation

## 2013-07-29 DIAGNOSIS — Z79899 Other long term (current) drug therapy: Secondary | ICD-10-CM | POA: Insufficient documentation

## 2013-07-29 DIAGNOSIS — E7800 Pure hypercholesterolemia, unspecified: Secondary | ICD-10-CM | POA: Insufficient documentation

## 2013-07-29 DIAGNOSIS — L299 Pruritus, unspecified: Secondary | ICD-10-CM

## 2013-07-29 DIAGNOSIS — L298 Other pruritus: Secondary | ICD-10-CM | POA: Insufficient documentation

## 2013-07-29 DIAGNOSIS — I10 Essential (primary) hypertension: Secondary | ICD-10-CM | POA: Insufficient documentation

## 2013-07-29 DIAGNOSIS — E119 Type 2 diabetes mellitus without complications: Secondary | ICD-10-CM | POA: Insufficient documentation

## 2013-07-29 DIAGNOSIS — IMO0002 Reserved for concepts with insufficient information to code with codable children: Secondary | ICD-10-CM | POA: Insufficient documentation

## 2013-07-29 DIAGNOSIS — I2510 Atherosclerotic heart disease of native coronary artery without angina pectoris: Secondary | ICD-10-CM | POA: Insufficient documentation

## 2013-07-29 MED ORDER — HYDROXYZINE HCL 25 MG PO TABS: 25.0000 mg | ORAL_TABLET | Freq: Four times a day (QID) | ORAL | Status: DC

## 2013-07-29 NOTE — ED Provider Notes (Signed)
CSN: [id number]     Arrival date & time 07/29/13  1248 History  This chart was scribed for non-physician practitioner Alvina Chou, PA-C working with Osvaldo Shipper, MD by Zettie Pho, ED Scribe. This patient was seen in room TR06C/TR06C and the patient's care was started at 1:22 PM.    Chief Complaint  Patient presents with  . Pruritis   Patient is a 63 y.o. male presenting with rash. The history is provided by the patient. No language interpreter was used.  Rash Location:  Full body Quality: itchiness   Severity:  Moderate Onset quality:  Gradual Timing:  Constant Progression:  Worsening Chronicity:  New Context: not exposure to similar rash   Relieved by:  Nothing Ineffective treatments: Permethrin.  HPI Comments: [name] is a 63 y.o. male with a history of Hepatitis C and DM who presents to the Emergency Department complaining of constant itching diffusely to his body that he states has been ongoing for the past 3 months. Patient states that the itching is worse at night and prevents him from sleeping well. He states that the itchiness began progressively worsening over the past month after the dosage of one of his DM medications was increased. Patient reports that he has switched all of his detergents and soaps to sensitive skin brands, but without relief of his symptoms. He denies any other recent changes in his environment. He states that he was evaluated by his PCP for similar complaints and was started on Permethrin, but without improvement, and that he may be referred to a specialist. Patient has an allergy to Mobic. Patient also has a history of coronary artery disease, HTN, and hypercholesterolemia.   Past Medical History  Diagnosis Date  . Diabetes mellitus   . Hypertension   . Coronary artery disease   . Hepatitis C   . High cholesterol   . Arthritis   . Hepatitis    Past Surgical History  Procedure Laterality Date  . Esophagogastroduodenoscopy   02/09/2011    Procedure: ESOPHAGOGASTRODUODENOSCOPY (EGD);  Surgeon: Missy Sabins, MD;  Location: [facility] ENDOSCOPY;  Service: Endoscopy;  Laterality: N/A;  . Joint replacement  10/22/2009    knee   Family History  Problem Relation Age of Onset  . Stroke Mother   . Diabetes Mother   . Hypertension Mother   . Hyperlipidemia Mother   . Heart disease Father   . Hypertension Father   . Heart attack Father    History  Substance Use Topics  . Smoking status: Former Smoker -- 0.25 packs/day for 30 years    Types: Cigarettes    Quit date: 03/12/2013  . Smokeless tobacco: Not on file     Comment: pt states that he will be trying to quit 03/05/2012  . 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  All other systems reviewed and are negative.   Allergies  Mobic  Home Medications   Prior to Admission medications   Medication Sig Start Date End Date Taking? Authorizing Provider  albuterol (PROVENTIL HFA;VENTOLIN HFA) 108 (90 BASE) MCG/ACT inhaler Inhale 2 puffs into the lungs every 6 (six) hours as needed for wheezing or shortness of breath. 06/26/13   Lorayne Marek, MD  Alcohol Swabs PADS 1 Package by Does not apply route 2 (two) times daily. 08/20/12   Robbie Lis, MD  aspirin EC 81 MG tablet Take 1 tablet (  81 mg total) by mouth daily. 12/19/12   Nishant Dhungel, MD  atorvastatin (LIPITOR) 10 MG tablet Take 10 mg by mouth daily. Take two tablets daily    Historical Provider, MD  cephALEXin (KEFLEX) 500 MG capsule Take 1 capsule (500 mg total) by mouth 4 (four) times daily. 06/26/13   Lorayne Marek, MD  Cetirizine HCl (ZYRTEC ALLERGY) 10 MG CAPS Take 1 capsule (10 mg total) by mouth at bedtime. 05/18/13   Lorayne Marek, MD  cholecalciferol (VITAMIN D) 1000 UNITS tablet Take 1 tablet (1,000 Units total) by mouth daily. 08/20/12   Robbie Lis, MD  cilostazol (PLETAL) 100 MG tablet Take 1 tablet (100 mg total) by mouth 2 (two)  times daily. 08/20/12   Robbie Lis, MD  gabapentin (NEURONTIN) 100 MG capsule Take 1 capsule (100 mg total) by mouth 3 (three) times daily. 12/19/12   Nishant Dhungel, MD  glipiZIDE (GLUCOTROL) 5 MG tablet Take 1 tablet (5 mg total) by mouth 2 (two) times daily before a meal. 06/26/13   Lorayne Marek, MD  glucose blood (AGAMATRIX PRESTO TEST) test strip Use as instructed 09/26/12   Clanford L Johnson, MD  glucose blood (ONE TOUCH ULTRA TEST) test strip Use as instructed 12/19/12   Angelica Chessman, MD  glucose blood (TRUETEST TEST) test strip Use as instructed 06/08/13   Lorayne Marek, MD  glucose blood test strip Use as instructed 06/23/13   Angelica Chessman, MD  HYDROcodone-acetaminophen (NORCO/VICODIN) 5-325 MG per tablet Take 2 tablets by mouth every 4 (four) hours as needed for pain. 01/25/12   Hannah Muthersbaugh, PA-C  hydrocortisone 2.5 % cream Apply topically 2 (two) times daily. 05/18/13   Lorayne Marek, MD  Lancets (ONETOUCH ULTRASOFT) lancets Use as instructed 06/23/13   Angelica Chessman, MD  lisinopril (PRINIVIL,ZESTRIL) 40 MG tablet Take 1 tablet (40 mg total) by mouth daily. 06/26/13   Lorayne Marek, MD  metFORMIN (GLUCOPHAGE) 1000 MG tablet Take 1 tablet (1,000 mg total) by mouth 2 (two) times daily with a meal. 06/26/13   Lorayne Marek, MD  methylPREDNISolone (MEDROL DOSEPAK) 4 MG tablet follow package directions 05/18/13   Lorayne Marek, MD  Multiple Vitamin (MULTIVITAMIN WITH MINERALS) TABS Take 1 tablet by mouth daily. 08/20/12   Robbie Lis, MD  NAPROXEN PO Take by mouth.    Historical Provider, MD  nicotine (EQ NICOTINE) 14 mg/24hr patch Place 1 patch (14 mg total) onto the skin daily. 03/17/13   Reyne Dumas, MD  omeprazole (PRILOSEC) 40 MG capsule Take 1 capsule (40 mg total) by mouth daily. 12/19/12   Nishant Dhungel, MD  pantoprazole (PROTONIX) 40 MG tablet Take 1 tablet (40 mg total) by mouth daily. 06/26/13   Lorayne Marek, MD  permethrin (ACTICIN) 5 % cream Apply 1  application topically once. 06/26/13   Lorayne Marek, MD  promethazine (PHENERGAN) 25 MG tablet Take 1 tablet (25 mg total) by mouth every 6 (six) hours as needed for nausea. 08/20/12   Robbie Lis, MD  rosuvastatin (CRESTOR) 20 MG tablet Take 1 tablet (20 mg total) by mouth daily. 06/26/13   Lorayne Marek, MD  sertraline (ZOLOFT) 50 MG tablet Take 1 tablet (50 mg total) by mouth daily. 08/20/12   Robbie Lis, MD  TRUEPLUS LANCETS 28G MISC Use as directed.  Test up to 4 times daily.  Diag code 250.12 Type 2 Diabetes 06/09/13   Angelica Chessman, MD   Triage Vitals: BP 119/80  Pulse 118  Temp(Src) 98.2 F (36.8 C) (Oral)  Resp 20  Ht 5\' 5"  (1.651 m)  Wt 170 lb 12.8 oz (77.474 kg)  BMI 28.42 kg/m2  SpO2 97%  Physical Exam  Nursing note and vitals reviewed. Constitutional: He is oriented to person, place, and time. He appears well-developed and well-nourished. No distress.  HENT:  Head: Normocephalic and atraumatic.  Eyes: Conjunctivae are normal.  Neck: Normal range of motion. Neck supple.  Pulmonary/Chest: Effort normal. No respiratory distress.  Abdominal: He exhibits no distension.  Musculoskeletal: Normal range of motion.  Neurological: He is alert and oriented to person, place, and time.  Skin: Skin is warm and dry. Rash noted.  Scattered papules with overlying excoriations on back, chest, abdomen, bilateral arms, bilateral legs.   Psychiatric: He has a normal mood and affect. His behavior is normal.    ED Course  Procedures (including critical care time)  DIAGNOSTIC STUDIES: Oxygen Saturation is 97% on room air, normal by my interpretation.    COORDINATION OF CARE: 1:35 PM- Will discharge patient with medication to manage symptoms. Advised patient to follow up with his PCP. Discussed treatment plan with patient at bedside and patient verbalized agreement.     Labs Review Labs Reviewed - No data to display  Imaging Review No results found.   EKG  Interpretation None      MDM   Final diagnoses:  Pruritus    Patient's rash and itching being investigated by his PCP. Patient has tried Permethrin cream and cortisone cream. I will prescribe atarax for the pruritis. Patient advised to follow up with his PCP for further evaluation.   I personally performed the services described in this documentation, which was scribed in my presence. The recorded information has been reviewed and is accurate.    Alvina Chou, PA-C 07/30/13 0745

## 2013-07-29 NOTE — Discharge Instructions (Signed)
Take Atarax as needed for itching. Refer to attached documents for more information. Follow up with your doctor for further evaluation.

## 2013-07-29 NOTE — ED Notes (Signed)
Pt c/o itchiness x 3 months to arms, back, abd, and legs. Pt sts he has tried multiple things at home with no relief. sts it has kept him awake and hasn't been able to get any sleep. sts he hasn't been seen for this yet. sts that ever since his DM pill was increased the itchiness increased also. Pt reports hx of sensitive skin, sts he has tried switching to laundry detergent and soap for sensitive skin but no relief. Nad, skin warm and dry, resp e/u.

## 2013-07-30 ENCOUNTER — Other Ambulatory Visit: Payer: Self-pay | Admitting: *Deleted

## 2013-07-30 ENCOUNTER — Ambulatory Visit: Payer: Medicare Other | Attending: Internal Medicine | Admitting: *Deleted

## 2013-07-30 DIAGNOSIS — L299 Pruritus, unspecified: Secondary | ICD-10-CM

## 2013-07-30 NOTE — Telephone Encounter (Signed)
Patient would like a refill on this medication until he sees PCP on 08/31/2013. Patient states every since diabetic medication dose was increased he has been itching.

## 2013-07-30 NOTE — Progress Notes (Unsigned)
Patient in today regarding itching. Patient was in the ED yesterday 07/29/2013 was prescribed Atarax but this has no refills. Patient states ever since diabetes medication was increased he has been itching. Patient was given an appointment for 08/31/2013 to discuss this with his PCP. Please follow up with patient if he can come in sooner and patient would like a refill until his appointment in June.

## 2013-07-31 NOTE — ED Provider Notes (Signed)
Medical screening examination/treatment/procedure(s) were performed by non-physician practitioner and as supervising physician I was immediately available for consultation/collaboration.   EKG Interpretation None        Osvaldo Shipper, MD 07/31/13 1556

## 2013-08-04 MED ORDER — HYDROXYZINE HCL 25 MG PO TABS: 25.0000 mg | ORAL_TABLET | Freq: Four times a day (QID) | ORAL | Status: DC

## 2013-08-18 ENCOUNTER — Ambulatory Visit (HOSPITAL_COMMUNITY)
Admission: RE | Admit: 2013-08-18 | Discharge: 2013-08-18 | Disposition: A | Discharge status: Home / Self Care | Payer: Medicare PPO | Source: Ambulatory Visit | Attending: Internal Medicine | Admitting: Internal Medicine

## 2013-08-18 ENCOUNTER — Ambulatory Visit: Payer: Commercial Managed Care - HMO | Attending: Internal Medicine | Admitting: Internal Medicine

## 2013-08-18 ENCOUNTER — Telehealth: Payer: Self-pay | Admitting: *Deleted

## 2013-08-18 VITALS — BP 153/88 | HR 116 | Resp 22

## 2013-08-18 DIAGNOSIS — K759 Inflammatory liver disease, unspecified: Secondary | ICD-10-CM | POA: Insufficient documentation

## 2013-08-18 DIAGNOSIS — J45909 Unspecified asthma, uncomplicated: Secondary | ICD-10-CM | POA: Diagnosis present

## 2013-08-18 DIAGNOSIS — J441 Chronic obstructive pulmonary disease with (acute) exacerbation: Secondary | ICD-10-CM

## 2013-08-18 DIAGNOSIS — E7800 Pure hypercholesterolemia, unspecified: Secondary | ICD-10-CM | POA: Diagnosis not present

## 2013-08-18 DIAGNOSIS — J45901 Unspecified asthma with (acute) exacerbation: Secondary | ICD-10-CM | POA: Diagnosis not present

## 2013-08-18 DIAGNOSIS — Z7982 Long term (current) use of aspirin: Secondary | ICD-10-CM | POA: Diagnosis not present

## 2013-08-18 DIAGNOSIS — I2510 Atherosclerotic heart disease of native coronary artery without angina pectoris: Secondary | ICD-10-CM | POA: Insufficient documentation

## 2013-08-18 DIAGNOSIS — B1920 Unspecified viral hepatitis C without hepatic coma: Secondary | ICD-10-CM | POA: Insufficient documentation

## 2013-08-18 DIAGNOSIS — I10 Essential (primary) hypertension: Secondary | ICD-10-CM | POA: Insufficient documentation

## 2013-08-18 DIAGNOSIS — R0602 Shortness of breath: Secondary | ICD-10-CM | POA: Insufficient documentation

## 2013-08-18 DIAGNOSIS — K219 Gastro-esophageal reflux disease without esophagitis: Secondary | ICD-10-CM | POA: Insufficient documentation

## 2013-08-18 DIAGNOSIS — E119 Type 2 diabetes mellitus without complications: Secondary | ICD-10-CM | POA: Diagnosis not present

## 2013-08-18 DIAGNOSIS — I70209 Unspecified atherosclerosis of native arteries of extremities, unspecified extremity: Secondary | ICD-10-CM

## 2013-08-18 MED ORDER — ALBUTEROL SULFATE (2.5 MG/3ML) 0.083% IN NEBU: 2.5000 mg | INHALATION_SOLUTION | Freq: Four times a day (QID) | RESPIRATORY_TRACT | Status: DC | PRN

## 2013-08-18 MED ORDER — PREDNISONE 20 MG PO TABS: 20.0000 mg | ORAL_TABLET | Freq: Every day | ORAL | Status: DC

## 2013-08-18 MED ORDER — BECLOMETHASONE DIPROPIONATE 80 MCG/ACT IN AERS: 2.0000 | INHALATION_SPRAY | Freq: Two times a day (BID) | RESPIRATORY_TRACT | Status: DC

## 2013-08-18 MED ORDER — ALBUTEROL SULFATE (2.5 MG/3ML) 0.083% IN NEBU
2.5000 mg | INHALATION_SOLUTION | Freq: Once | RESPIRATORY_TRACT | Status: AC
Start: 2013-08-18 — End: 2013-08-18
  Administered 2013-08-18: 2.5 mg via RESPIRATORY_TRACT

## 2013-08-18 MED ORDER — METHYLPREDNISOLONE SODIUM SUCC 125 MG IJ SOLR
125.0000 mg | Freq: Once | INTRAMUSCULAR | Status: AC
  Administered 2013-08-18: 125 mg via INTRAMUSCULAR

## 2013-08-18 MED ORDER — DOXYCYCLINE HYCLATE 50 MG PO CAPS: 50.0000 mg | ORAL_CAPSULE | Freq: Two times a day (BID) | ORAL | Status: DC

## 2013-08-18 MED ORDER — GUAIFENESIN-CODEINE 100-10 MG/5ML PO SOLN: 10.0000 mL | Freq: Three times a day (TID) | ORAL | Status: DC | PRN

## 2013-08-18 NOTE — Patient Instructions (Addendum)
Chronic Obstructive Pulmonary Disease Chronic obstructive pulmonary disease (COPD) is a common lung condition in which airflow from the lungs is limited. COPD is a general term that can be used to describe many different lung problems that limit airflow, including both chronic bronchitis and emphysema. If you have COPD, your lung function will probably never return to normal, but there are measures you can take to improve lung function and make yourself feel better.  CAUSES   Smoking (common).   Exposure to secondhand smoke.   Genetic problems.  Chronic inflammatory lung diseases or recurrent infections. SYMPTOMS   Shortness of breath, especially with physical activity.   Deep, persistent (chronic) cough with a large amount of thick mucus.   Wheezing.   Rapid breaths (tachypnea).   Gray or bluish discoloration (cyanosis) of the skin, especially in fingers, toes, or lips.   Fatigue.   Weight loss.   Frequent infections or episodes when breathing symptoms become much worse (exacerbations).   Chest tightness. DIAGNOSIS  Your healthcare Bexleigh Theriault will take a medical history and perform a physical examination to make the initial diagnosis. Additional tests for COPD may include:   Lung (pulmonary) function tests.  Chest X-ray.  CT scan.  Blood tests. TREATMENT  Treatment available to help you feel better when you have COPD include:   Inhaler and nebulizer medicines. These help manage the symptoms of COPD and make your breathing more comfortable  Supplemental oxygen. Supplemental oxygen is only helpful if you have a low oxygen level in your blood.   Exercise and physical activity. These are beneficial for nearly all people with COPD. Some people may also benefit from a pulmonary rehabilitation program. HOME CARE INSTRUCTIONS   Take all medicines (inhaled or pills) as directed by your health care Loreli Debruler.  Only take over-the-counter or prescription medicines  for pain, fever, or discomfort as directed by your health care Meliyah Simon.   Avoid over-the-counter medicines or cough syrups that dry up your airway (such as antihistamines) and slow down the elimination of secretions unless instructed otherwise by your healthcare Cabela Pacifico.   If you are a smoker, the most important thing that you can do is stop smoking. Continuing to smoke will cause further lung damage and breathing trouble. Ask your health care Alinda Egolf for help with quitting smoking. He or she can direct you to community resources or hospitals that provide support.  Avoid exposure to irritants such as smoke, chemicals, and fumes that aggravate your breathing.  Use oxygen therapy and pulmonary rehabilitation if directed by your health care Malashia Kamaka. If you require home oxygen therapy, ask your healthcare Maleiya Pergola whether you should purchase a pulse oximeter to measure your oxygen level at home.   Avoid contact with individuals who have a contagious illness.  Avoid extreme temperature and humidity changes.  Eat healthy foods. Eating smaller, more frequent meals and resting before meals may help you maintain your strength.  Stay active, but balance activity with periods of rest. Exercise and physical activity will help you maintain your ability to do things you want to do.  Preventing infection and hospitalization is very important when you have COPD. Make sure to receive all the vaccines your health care Shubham Thackston recommends, especially the pneumococcal and influenza vaccines. Ask your healthcare Shariyah Eland whether you need a pneumonia vaccine.  Learn and use relaxation techniques to manage stress.  Learn and use controlled breathing techniques as directed by your health care Jasreet Dickie. Controlled breathing techniques include:   Pursed lip breathing. Start by breathing   in (inhaling) through your nose for 1 second. Then, purse your lips as if you were going to whistle and breathe out (exhale)  through the pursed lips for 2 seconds.   Diaphragmatic breathing. Start by putting one hand on your abdomen just above your waist. Inhale slowly through your nose. The hand on your abdomen should move out. Then purse your lips and exhale slowly. You should be able to feel the hand on your abdomen moving in as you exhale.   Learn and use controlled coughing to clear mucus from your lungs. Controlled coughing is a series of short, progressive coughs. The steps of controlled coughing are:  1. Lean your head slightly forward.  2. Breathe in deeply using diaphragmatic breathing.  3. Try to hold your breath for 3 seconds.  4. Keep your mouth slightly open while coughing twice.  5. Spit any mucus out into a tissue.  6. Rest and repeat the steps once or twice as needed. SEEK MEDICAL CARE IF:   You are coughing up more mucus than usual.   There is a change in the color or thickness of your mucus.   Your breathing is more labored than usual.   Your breathing is faster than usual.  SEEK IMMEDIATE MEDICAL CARE IF:   You have shortness of breath while you are resting.   You have shortness of breath that prevents you from:  Being able to talk.   Performing your usual physical activities.   You have chest pain lasting longer than 5 minutes.   Your skin color is more cyanotic than usual.  You measure low oxygen saturations for longer than 5 minutes with a pulse oximeter. MAKE SURE YOU:   Understand these instructions.  Will watch your condition.  Will get help right away if you are not doing well or get worse. Document Released: 11/29/2004 Document Revised: 12/10/2012 Document Reviewed: 10/16/2012 [facility] Patient Information 2014 [place], [place].   Pt feel a lot better. Instructed to start prescribed Prednisone tomorrow.

## 2013-08-18 NOTE — Progress Notes (Signed)
Pt here with c/o asthma exacerbation following with increased weakness and sob that started last night. Pt states EMS came to his home and treated with nebs/oxygen and told to f/u with pcp for Tessalon medication and evaluation. Pt c/o increased sob in waiting area per front desk registration. Pt brought back to room and breathing treatment started, with Solumedrol 125 mg injection. Wheezing noted in lower lobes per auscultation. Pt appears stable, O2 sats 95% 116

## 2013-08-18 NOTE — Progress Notes (Signed)
Patient ID: [name], male   DOB: [birth year], 62 y.o.   MRN: [id number]   [name], is a 63 y.o. male  CBU:[id number]  EHO:[id number]  DOB - Jul 29, 1950  Chief Complaint  Patient presents with  . Follow-up  . Asthma        Subjective:   [name] is a 63 y.o. male here today for a follow up visit. Patient has history of hypertension, hyperlipidemia, GERD. Pt here with c/o asthma exacerbation with increased weakness and sob that started last night. Pt states EMS came to his home and treated with nebs/oxygen and told to f/u with pcp for Tessalon medication and evaluation. Pt c/o increased sob in waiting area per front desk staff. Pt brought back to room and breathing treatment started, with Solumedrol 125 mg injection. Patient was audibly wheezing before administering albuterol by nebulizer, O2 sats 95%, heart rate was 116. She also complains of cough productive of whitish sputum. He denies any fever. No contact with chronic constipation. Patient has No headache, No chest pain, No abdominal pain - No Nausea, No new weakness tingling or numbness. Problem  Copd Exacerbation  Asthma With Acute Exacerbation    ALLERGIES: Allergies  Allergen Reactions  . Mobic [Meloxicam] Rash    PAST MEDICAL HISTORY: Past Medical History  Diagnosis Date  . Diabetes mellitus   . Hypertension   . Coronary artery disease   . Hepatitis C   . High cholesterol   . Arthritis   . Hepatitis     MEDICATIONS AT HOME: Prior to Admission medications   Medication Sig Start Date End Date Taking? Authorizing Provider  albuterol (PROVENTIL HFA;VENTOLIN HFA) 108 (90 BASE) MCG/ACT inhaler Inhale 2 puffs into the lungs every 6 (six) hours as needed for wheezing or shortness of breath. 06/26/13   Lorayne Marek, MD  albuterol (PROVENTIL) (2.5 MG/3ML) 0.083% nebulizer solution Take 3 mLs (2.5 mg total) by nebulization every 6 (six) hours as needed for wheezing or shortness of breath. 08/18/13   Angelica Chessman, MD  Alcohol Swabs PADS 1 Package by Does not apply route 2 (two) times daily. 08/20/12   Robbie Lis, MD  aspirin EC 81 MG tablet Take 1 tablet (81 mg total) by mouth daily. 12/19/12   Nishant Dhungel, MD  atorvastatin (LIPITOR) 10 MG tablet Take 10 mg by mouth daily. Take two tablets daily    Historical Provider, MD  beclomethasone (QVAR) 80 MCG/ACT inhaler Inhale 2 puffs into the lungs 2 (two) times daily. 08/18/13   Angelica Chessman, MD  Cetirizine HCl (ZYRTEC ALLERGY) 10 MG CAPS Take 1 capsule (10 mg total) by mouth at bedtime. 05/18/13   Lorayne Marek, MD  cholecalciferol (VITAMIN D) 1000 UNITS tablet Take 1 tablet (1,000 Units total) by mouth daily. 08/20/12   Robbie Lis, MD  cilostazol (PLETAL) 100 MG tablet Take 1 tablet (100 mg total) by mouth 2 (two) times daily. 08/20/12   Robbie Lis, MD  doxycycline (VIBRAMYCIN) 50 MG capsule Take 1 capsule (50 mg total) by mouth 2 (two) times daily. 08/18/13   Angelica Chessman, MD  gabapentin (NEURONTIN) 100 MG capsule Take 1 capsule (100 mg total) by mouth 3 (three) times daily. 12/19/12   Nishant Dhungel, MD  glipiZIDE (GLUCOTROL) 5 MG tablet Take 1 tablet (5 mg total) by mouth 2 (two) times daily before a meal. 06/26/13   Lorayne Marek, MD  glucose blood (AGAMATRIX PRESTO TEST) test strip Use as instructed 09/26/12   Clanford L Wynetta Emery,  MD  glucose blood (ONE TOUCH ULTRA TEST) test strip Use as instructed 12/19/12   Angelica Chessman, MD  glucose blood (TRUETEST TEST) test strip Use as instructed 06/08/13   Lorayne Marek, MD  glucose blood test strip Use as instructed 06/23/13   Angelica Chessman, MD  guaiFENesin-codeine 100-10 MG/5ML syrup Take 10 mLs by mouth 3 (three) times daily as needed for cough. 08/18/13   Angelica Chessman, MD  HYDROcodone-acetaminophen (NORCO/VICODIN) 5-325 MG per tablet Take 2 tablets by mouth every 4 (four) hours as needed for pain. 01/25/12   Hannah Muthersbaugh, PA-C  hydrocortisone 2.5 % cream Apply topically 2  (two) times daily. 05/18/13   Lorayne Marek, MD  hydrOXYzine (ATARAX/VISTARIL) 25 MG tablet Take 1 tablet (25 mg total) by mouth every 6 (six) hours.    Lorayne Marek, MD  Lancets (ONETOUCH ULTRASOFT) lancets Use as instructed 06/23/13   Angelica Chessman, MD  lisinopril (PRINIVIL,ZESTRIL) 40 MG tablet Take 1 tablet (40 mg total) by mouth daily. 06/26/13   Lorayne Marek, MD  metFORMIN (GLUCOPHAGE) 1000 MG tablet Take 1 tablet (1,000 mg total) by mouth 2 (two) times daily with a meal. 06/26/13   Lorayne Marek, MD  methylPREDNISolone (MEDROL DOSEPAK) 4 MG tablet follow package directions 05/18/13   Lorayne Marek, MD  Multiple Vitamin (MULTIVITAMIN WITH MINERALS) TABS Take 1 tablet by mouth daily. 08/20/12   Robbie Lis, MD  NAPROXEN PO Take by mouth.    Historical Provider, MD  nicotine (EQ NICOTINE) 14 mg/24hr patch Place 1 patch (14 mg total) onto the skin daily. 03/17/13   Reyne Dumas, MD  omeprazole (PRILOSEC) 40 MG capsule Take 1 capsule (40 mg total) by mouth daily. 12/19/12   Nishant Dhungel, MD  pantoprazole (PROTONIX) 40 MG tablet Take 1 tablet (40 mg total) by mouth daily. 06/26/13   Lorayne Marek, MD  permethrin (ACTICIN) 5 % cream Apply 1 application topically once. 06/26/13   Lorayne Marek, MD  predniSONE (DELTASONE) 20 MG tablet Take 1 tablet (20 mg total) by mouth daily with breakfast. 08/18/13   Angelica Chessman, MD  promethazine (PHENERGAN) 25 MG tablet Take 1 tablet (25 mg total) by mouth every 6 (six) hours as needed for nausea. 08/20/12   Robbie Lis, MD  rosuvastatin (CRESTOR) 20 MG tablet Take 1 tablet (20 mg total) by mouth daily. 06/26/13   Lorayne Marek, MD  sertraline (ZOLOFT) 50 MG tablet Take 1 tablet (50 mg total) by mouth daily. 08/20/12   Robbie Lis, MD  TRUEPLUS LANCETS 28G MISC Use as directed.  Test up to 4 times daily.  Diag code 250.12 Type 2 Diabetes 06/09/13   Angelica Chessman, MD     Objective:   Filed Vitals:   08/18/13 1200  BP: 153/88  Pulse: 116  Resp: 22   SpO2: 95%    Exam General appearance : Awake, alert, not in any distress. Speech Clear. Not toxic looking HEENT: Atraumatic and Normocephalic, pupils equally reactive to light and accomodation Neck: supplood aire, no JVD. No cervical lymphadenopathy.  Chest: Reduced air entry bilaterally, bilateral wheezes, no added sounds, no crepitations. CVS: S1 S2 regular, no murmurs.  Abdomen: Bowel sounds present, Non tender and not distended with no gaurding, rigidity or rebound. Extremities: B/L Lower Ext shows no edema, both legs are warm to touch Neurology: Awake alert, and oriented X 3, CN II-XII intact, Non focal Skin:No Rash Wounds:N/A  Data Review Lab Results  Component Value Date   HGBA1C 9.6 06/22/2013   HGBA1C 9.4  03/17/2013   HGBA1C 9.1% 12/19/2012     Assessment & Plan   1. Asthma with acute exacerbation  - albuterol (PROVENTIL) (2.5 MG/3ML) 0.083% nebulizer solution 2.5 mg; Take 3 mLs (2.5 mg total) by nebulization once. - methylPREDNISolone sodium succinate (SOLU-MEDROL) 125 mg/2 mL injection 125 mg; Inject 2 mLs (125 mg total) into the muscle once.  2. Essential hypertension, benign: Continue current regimen. DASH diet Patient was counseled extensively on medication compliance.  3. COPD exacerbation  - beclomethasone (QVAR) 80 MCG/ACT inhaler; Inhale 2 puffs into the lungs 2 (two) times daily.  Dispense: 1 Inhaler; Refill: 1 - predniSONE (DELTASONE) 20 MG tablet; Take 1 tablet (20 mg total) by mouth daily with breakfast.  Dispense: 5 tablet; Refill: 0 - doxycycline (VIBRAMYCIN) 50 MG capsule; Take 1 capsule (50 mg total) by mouth 2 (two) times daily.  Dispense: 14 capsule; Refill: 0 - guaiFENesin-codeine 100-10 MG/5ML syrup; Take 10 mLs by mouth 3 (three) times daily as needed for cough.  Dispense: 480 mL; Refill: 0 - DG Chest 2 View; Future - albuterol (PROVENTIL) (2.5 MG/3ML) 0.083% nebulizer solution; Take 3 mLs (2.5 mg total) by nebulization every 6 (six) hours as  needed for wheezing or shortness of breath.  Dispense: 75 mL; Refill: 12 - For home use only DME Nebulizer machine  Patient was extensively counseled about nutrition and exercise as tolerated.  Return in about 1 month (around 09/17/2013), or if symptoms worsen or fail to improve, for COPD, Hemoglobin A1C and Follow up, DM, Follow up HTN.  The patient was given clear instructions to go to ER or return to medical center if symptoms don't improve, worsen or new problems develop. The patient verbalized understanding. The patient was told to call to get lab results if they haven't heard anything in the next week.   This note has been created with Surveyor, quantity. Any transcriptional errors are unintentional.    Angelica Chessman, MD, [place], [place], [place], [place]   08/18/2013, 1:02 PM

## 2013-08-18 NOTE — Telephone Encounter (Signed)
Patient called stating he needs a prsecription for Tessalon. Patient states EMS had to come to his house last night because he was having trouble breathing. Informed patient he needs to come to be assessed. Patient verbalized understanding. Patient stated he was on his way here. Informed patient I will assess him and consult with his PCP. Vivia Birmingham, RN

## 2013-08-24 ENCOUNTER — Telehealth: Payer: Self-pay | Admitting: *Deleted

## 2013-08-24 ENCOUNTER — Telehealth: Payer: Self-pay | Admitting: Emergency Medicine

## 2013-08-24 DIAGNOSIS — J209 Acute bronchitis, unspecified: Secondary | ICD-10-CM

## 2013-08-24 MED ORDER — GUAIFENESIN-DM 100-10 MG/5ML PO SYRP: 5.0000 mL | ORAL_SOLUTION | ORAL | Status: DC | PRN

## 2013-08-24 NOTE — Telephone Encounter (Signed)
Pt given chest xray results with medication instructions to continue taking prescribed ABT's with alternative cough medication Pt states he picked up medication today. C/o slight sob Instructed pt to please take prescribed atb and f/u with clinic in 2 dys

## 2013-08-24 NOTE — Telephone Encounter (Signed)
Patient called stating that his insurance will not pay for the Guafinesin-Codeine syrup and would like another prescription for something else. Spoke with Dr. Doreene Burke who prescribed Guafinesen DM. Informed patient that prescription has been sent to his preferred pharmacy. Patient verbalized understanding. Vivia Birmingham, RN

## 2013-08-24 NOTE — Telephone Encounter (Signed)
Returning patient's call. Left a message for patient to call us back.

## 2013-08-24 NOTE — Telephone Encounter (Signed)
Message copied by Ricci Barker on Mon Aug 24, 2013  6:02 PM ------      Message from: Tresa Garter      Created: Sun Aug 23, 2013  4:45 PM       Please inform patient that his chest x-ray shows mild bronchitis. Continue the medications prescribed and call if no improvement ------

## 2013-08-24 NOTE — Telephone Encounter (Signed)
Left message for pt to call for chest xray results. We will f/ with medication change for cough med

## 2013-08-31 ENCOUNTER — Encounter: Payer: Self-pay | Admitting: Internal Medicine

## 2013-08-31 ENCOUNTER — Ambulatory Visit: Payer: Medicare PPO | Attending: Internal Medicine | Admitting: Internal Medicine

## 2013-08-31 VITALS — BP 150/94 | HR 76 | Temp 98.5°F | Resp 16 | Wt 167.0 lb

## 2013-08-31 DIAGNOSIS — I10 Essential (primary) hypertension: Secondary | ICD-10-CM | POA: Diagnosis not present

## 2013-08-31 DIAGNOSIS — E119 Type 2 diabetes mellitus without complications: Secondary | ICD-10-CM | POA: Insufficient documentation

## 2013-08-31 DIAGNOSIS — R21 Rash and other nonspecific skin eruption: Secondary | ICD-10-CM | POA: Insufficient documentation

## 2013-08-31 DIAGNOSIS — J45909 Unspecified asthma, uncomplicated: Secondary | ICD-10-CM | POA: Insufficient documentation

## 2013-08-31 DIAGNOSIS — R062 Wheezing: Secondary | ICD-10-CM

## 2013-08-31 DIAGNOSIS — L299 Pruritus, unspecified: Secondary | ICD-10-CM

## 2013-08-31 DIAGNOSIS — B1920 Unspecified viral hepatitis C without hepatic coma: Secondary | ICD-10-CM | POA: Diagnosis not present

## 2013-08-31 DIAGNOSIS — E139 Other specified diabetes mellitus without complications: Secondary | ICD-10-CM

## 2013-08-31 DIAGNOSIS — E089 Diabetes mellitus due to underlying condition without complications: Secondary | ICD-10-CM

## 2013-08-31 LAB — GLUCOSE, POCT (MANUAL RESULT ENTRY): POC GLUCOSE: 107 mg/dL — AB (ref 70–99)

## 2013-08-31 MED ORDER — LISINOPRIL 40 MG PO TABS: 40.0000 mg | ORAL_TABLET | Freq: Every day | ORAL | Status: DC

## 2013-08-31 MED ORDER — CETIRIZINE HCL 10 MG PO CAPS: 1.0000 | ORAL_CAPSULE | Freq: Every day | ORAL | Status: DC

## 2013-08-31 MED ORDER — ALBUTEROL SULFATE HFA 108 (90 BASE) MCG/ACT IN AERS: 2.0000 | INHALATION_SPRAY | Freq: Four times a day (QID) | RESPIRATORY_TRACT | Status: DC | PRN

## 2013-08-31 MED ORDER — ASPIRIN EC 81 MG PO TBEC: 81.0000 mg | DELAYED_RELEASE_TABLET | Freq: Every day | ORAL | Status: DC

## 2013-08-31 MED ORDER — BECLOMETHASONE DIPROPIONATE 80 MCG/ACT IN AERS: 2.0000 | INHALATION_SPRAY | Freq: Two times a day (BID) | RESPIRATORY_TRACT | Status: DC

## 2013-08-31 MED ORDER — CILOSTAZOL 100 MG PO TABS: 100.0000 mg | ORAL_TABLET | Freq: Two times a day (BID) | ORAL | Status: DC

## 2013-08-31 MED ORDER — HYDROXYZINE HCL 25 MG PO TABS: 25.0000 mg | ORAL_TABLET | Freq: Four times a day (QID) | ORAL | Status: DC

## 2013-08-31 MED ORDER — GABAPENTIN 100 MG PO CAPS
100.0000 mg | ORAL_CAPSULE | Freq: Three times a day (TID) | ORAL | Status: DC
Start: 2013-08-31 — End: 2013-11-04

## 2013-08-31 MED ORDER — ATORVASTATIN CALCIUM 10 MG PO TABS: 10.0000 mg | ORAL_TABLET | Freq: Every day | ORAL | Status: DC

## 2013-08-31 MED ORDER — OMEPRAZOLE 40 MG PO CPDR
40.0000 mg | DELAYED_RELEASE_CAPSULE | Freq: Every day | ORAL | Status: DC
Start: 2013-08-31 — End: 2014-03-12

## 2013-08-31 MED ORDER — PREDNISONE 20 MG PO TABS
20.0000 mg | ORAL_TABLET | Freq: Every day | ORAL | Status: DC
Start: 2013-08-31 — End: 2013-11-11

## 2013-08-31 MED ORDER — GLIPIZIDE 5 MG PO TABS: 5.0000 mg | ORAL_TABLET | Freq: Two times a day (BID) | ORAL | Status: DC

## 2013-08-31 MED ORDER — METFORMIN HCL 1000 MG PO TABS: 1000.0000 mg | ORAL_TABLET | Freq: Two times a day (BID) | ORAL | Status: DC

## 2013-08-31 NOTE — Progress Notes (Signed)
Patient here for follow up on his DM Has history of bronchitis and COPD

## 2013-08-31 NOTE — Progress Notes (Signed)
MRN: [id number] Name: [name]  Sex: male Age: 63 y.o. DOB: 02-13-1951  Allergies: Mobic  Chief Complaint  Patient presents with  . Follow-up    HPI: Patient is 63 y.o. male who has to of diabetes hypertension asthma COPD chronic skin rash, comes today for followup, recently was treated for COPD exacerbation and reports improvement in the symptoms denies smoking cigarettes, patient is requesting refill on his medications, reports improvement in his fasting sugar level today's blood pressure is slightly elevated as per patient he has not taken his blood pressure medication today. For the skin rash she has already been treated with steroid as well as permethrin patient for slight improvement  but still has persistent itchy rash.  Past Medical History  Diagnosis Date  . Diabetes mellitus   . Hypertension   . Coronary artery disease   . Hepatitis C   . High cholesterol   . Arthritis   . Hepatitis     Past Surgical History  Procedure Laterality Date  . Esophagogastroduodenoscopy  02/09/2011    Procedure: ESOPHAGOGASTRODUODENOSCOPY (EGD);  Surgeon: Missy Sabins, MD;  Location: [facility] ENDOSCOPY;  Service: Endoscopy;  Laterality: N/A;  . Joint replacement  10/22/2009    knee      Medication List       This list is accurate as of: 08/31/13 10:12 AM.  Always use your most recent med list.               albuterol (2.5 MG/3ML) 0.083% nebulizer solution  Commonly known as:  PROVENTIL  Take 3 mLs (2.5 mg total) by nebulization every 6 (six) hours as needed for wheezing or shortness of breath.     albuterol 108 (90 BASE) MCG/ACT inhaler  Commonly known as:  PROVENTIL HFA;VENTOLIN HFA  Inhale 2 puffs into the lungs every 6 (six) hours as needed for wheezing or shortness of breath.     Alcohol Swabs Pads  1 Package by Does not apply route 2 (two) times daily.     aspirin EC 81 MG tablet  Take 1 tablet (81 mg total) by mouth daily.     atorvastatin 10 MG tablet  Commonly  known as:  LIPITOR  Take 1 tablet (10 mg total) by mouth daily. Take two tablets daily     beclomethasone 80 MCG/ACT inhaler  Commonly known as:  QVAR  Inhale 2 puffs into the lungs 2 (two) times daily.     Cetirizine HCl 10 MG Caps  Commonly known as:  ZYRTEC ALLERGY  Take 1 capsule (10 mg total) by mouth at bedtime.     cholecalciferol 1000 UNITS tablet  Commonly known as:  VITAMIN D  Take 1 tablet (1,000 Units total) by mouth daily.     cilostazol 100 MG tablet  Commonly known as:  PLETAL  Take 1 tablet (100 mg total) by mouth 2 (two) times daily.     doxycycline 50 MG capsule  Commonly known as:  VIBRAMYCIN  Take 1 capsule (50 mg total) by mouth 2 (two) times daily.     gabapentin 100 MG capsule  Commonly known as:  NEURONTIN  Take 1 capsule (100 mg total) by mouth 3 (three) times daily.     glipiZIDE 5 MG tablet  Commonly known as:  GLUCOTROL  Take 1 tablet (5 mg total) by mouth 2 (two) times daily before a meal.     glucose blood test strip  Commonly known as:  AGAMATRIX PRESTO TEST  Use  as instructed     glucose blood test strip  Commonly known as:  ONE TOUCH ULTRA TEST  Use as instructed     glucose blood test strip  Commonly known as:  TRUETEST TEST  Use as instructed     glucose blood test strip  Use as instructed     guaiFENesin-codeine 100-10 MG/5ML syrup  Take 10 mLs by mouth 3 (three) times daily as needed for cough.     guaiFENesin-dextromethorphan 100-10 MG/5ML syrup  Commonly known as:  ROBITUSSIN DM  Take 5 mLs by mouth every 4 (four) hours as needed for cough.     HYDROcodone-acetaminophen 5-325 MG per tablet  Commonly known as:  NORCO/VICODIN  Take 2 tablets by mouth every 4 (four) hours as needed for pain.     hydrocortisone 2.5 % cream  Apply topically 2 (two) times daily.     hydrOXYzine 25 MG tablet  Commonly known as:  ATARAX/VISTARIL  Take 1 tablet (25 mg total) by mouth every 6 (six) hours.     lisinopril 40 MG tablet    Commonly known as:  PRINIVIL,ZESTRIL  Take 1 tablet (40 mg total) by mouth daily.     metFORMIN 1000 MG tablet  Commonly known as:  GLUCOPHAGE  Take 1 tablet (1,000 mg total) by mouth 2 (two) times daily with a meal.     methylPREDNISolone 4 MG tablet  Commonly known as:  MEDROL DOSEPAK  follow package directions     multivitamin with minerals Tabs tablet  Take 1 tablet by mouth daily.     NAPROXEN PO  Take by mouth.     nicotine 14 mg/24hr patch  Commonly known as:  EQ NICOTINE  Place 1 patch (14 mg total) onto the skin daily.     omeprazole 40 MG capsule  Commonly known as:  PRILOSEC  Take 1 capsule (40 mg total) by mouth daily.     pantoprazole 40 MG tablet  Commonly known as:  PROTONIX  Take 1 tablet (40 mg total) by mouth daily.     permethrin 5 % cream  Commonly known as:  ACTICIN  Apply 1 application topically once.     predniSONE 20 MG tablet  Commonly known as:  DELTASONE  Take 1 tablet (20 mg total) by mouth daily with breakfast.     promethazine 25 MG tablet  Commonly known as:  PHENERGAN  Take 1 tablet (25 mg total) by mouth every 6 (six) hours as needed for nausea.     rosuvastatin 20 MG tablet  Commonly known as:  CRESTOR  Take 1 tablet (20 mg total) by mouth daily.     sertraline 50 MG tablet  Commonly known as:  ZOLOFT  Take 1 tablet (50 mg total) by mouth daily.     TRUEPLUS LANCETS 28G Misc  Use as directed.  Test up to 4 times daily.  Diag code 250.12 Type 2 Diabetes     onetouch ultrasoft lancets  Use as instructed        Meds ordered this encounter  Medications  . albuterol (PROVENTIL HFA;VENTOLIN HFA) 108 (90 BASE) MCG/ACT inhaler    Sig: Inhale 2 puffs into the lungs every 6 (six) hours as needed for wheezing or shortness of breath.    Dispense:  1 Inhaler    Refill:  2  . aspirin EC 81 MG tablet    Sig: Take 1 tablet (81 mg total) by mouth daily.    Dispense:  30 tablet  Refill:  3  . atorvastatin (LIPITOR) 10 MG tablet     Sig: Take 1 tablet (10 mg total) by mouth daily. Take two tablets daily    Dispense:  60 tablet    Refill:  0  . beclomethasone (QVAR) 80 MCG/ACT inhaler    Sig: Inhale 2 puffs into the lungs 2 (two) times daily.    Dispense:  1 Inhaler    Refill:  1  . Cetirizine HCl (ZYRTEC ALLERGY) 10 MG CAPS    Sig: Take 1 capsule (10 mg total) by mouth at bedtime.    Dispense:  30 capsule    Refill:  1  . cilostazol (PLETAL) 100 MG tablet    Sig: Take 1 tablet (100 mg total) by mouth 2 (two) times daily.    Dispense:  60 tablet    Refill:  0  . gabapentin (NEURONTIN) 100 MG capsule    Sig: Take 1 capsule (100 mg total) by mouth 3 (three) times daily.    Dispense:  30 capsule    Refill:  2  . glipiZIDE (GLUCOTROL) 5 MG tablet    Sig: Take 1 tablet (5 mg total) by mouth 2 (two) times daily before a meal.    Dispense:  60 tablet    Refill:  3  . hydrOXYzine (ATARAX/VISTARIL) 25 MG tablet    Sig: Take 1 tablet (25 mg total) by mouth every 6 (six) hours.    Dispense:  30 tablet    Refill:  0    Order Specific Question:  Supervising Provider    Answer:  Noemi Chapel D [[id number]]  . lisinopril (PRINIVIL,ZESTRIL) 40 MG tablet    Sig: Take 1 tablet (40 mg total) by mouth daily.    Dispense:  30 tablet    Refill:  3  . metFORMIN (GLUCOPHAGE) 1000 MG tablet    Sig: Take 1 tablet (1,000 mg total) by mouth 2 (two) times daily with a meal.    Dispense:  180 tablet    Refill:  3  . omeprazole (PRILOSEC) 40 MG capsule    Sig: Take 1 capsule (40 mg total) by mouth daily.    Dispense:  30 capsule    Refill:  3  . predniSONE (DELTASONE) 20 MG tablet    Sig: Take 1 tablet (20 mg total) by mouth daily with breakfast.    Dispense:  5 tablet    Refill:  0    Immunization History  Administered Date(s) Administered  . Influenza Split 02/10/2011  . Influenza, Seasonal, Injecte, Preservative Fre 12/19/2012    Family History  Problem Relation Age of Onset  . Stroke Mother   . Diabetes Mother   .  Hypertension Mother   . Hyperlipidemia Mother   . Heart disease Father   . Hypertension Father   . Heart attack Father     History  Substance Use Topics  . Smoking status: Former Smoker -- 0.25 packs/day for 30 years    Types: Cigarettes    Quit date: 03/12/2013  . Smokeless tobacco: Not on file     Comment: pt states that he will be trying to quit 03/05/2012  . 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  Eyes: EOM are normal. Pupils are equal, round, and reactive to light.  Cardiovascular: Normal rate and regular rhythm.   Pulmonary/Chest: Breath sounds normal. No respiratory distress. He has no wheezes. He has no rales.  Skin:  Rash on the arms, back, abdomen    CBC    Component Value Date/Time   WBC 5.9 03/17/2013 1529   RBC 5.35 03/17/2013 1529   HGB 15.1 03/17/2013 1529   HCT 44.4 03/17/2013 1529   PLT 244 03/17/2013 1529   MCV 83.0 03/17/2013 1529   LYMPHSABS 3.6 03/17/2013 1529   MONOABS 0.4 03/17/2013 1529   EOSABS 0.3 03/17/2013 1529   BASOSABS 0.0 03/17/2013 1529    CMP     Component Value Date/Time   NA 135 03/17/2013 1529   K 4.2 03/17/2013 1529   CL 100 03/17/2013 1529   CO2 25 03/17/2013 1529   GLUCOSE 290* 03/17/2013 1529   BUN 14 03/17/2013 1529   CREATININE 0.87 03/17/2013 1529   CREATININE 0.60 01/25/2012 1231   CALCIUM 9.5 03/17/2013 1529   PROT 7.4 03/17/2013 1529   ALBUMIN 4.4 03/17/2013 1529   AST 51* 03/17/2013 1529   ALT 79* 03/17/2013 1529   ALKPHOS 67 03/17/2013 1529   BILITOT 0.5 03/17/2013 1529   GFRNONAA >89 03/17/2013 1529   GFRNONAA >90 01/25/2012 1231   GFRAA >89 03/17/2013 1529   GFRAA >90 01/25/2012 1231    Lab Results  Component Value Date/Time   CHOL 135 03/17/2013  3:29 PM    No components found with this basename:  hga1c    Lab Results  Component Value Date/Time   AST 51* 03/17/2013  3:29 PM    Assessment and Plan  Diabetes mellitus due to underlying condition without complications - Plan:  Results for orders placed in visit on 08/31/13  GLUCOSE, POCT (MANUAL RESULT ENTRY)      Result Value Ref Range   POC Glucose 107 (*) 70 - 99 mg/dl   , glipiZIDE (GLUCOTROL) 5 MG tablet, metFORMIN (GLUCOPHAGE) 1000 MG tablet  Patient will continued current medications, will repeat her A1c on next visit, continue with diabetes meal planning.  Wheezing - Plan: albuterol (PROVENTIL HFA;VENTOLIN HFA) 108 (90 BASE) MCG/ACT inhaler  Rash and nonspecific skin eruption - Plan: Cetirizine HCl (ZYRTEC ALLERGY) 10 MG CAPS, Ambulatory referral to Dermatology  Itching - Plan: hydrOXYzine (ATARAX/VISTARIL) 25 MG tablet, Ambulatory referral to Dermatology  Essential hypertension, benign - Plan: lisinopril (PRINIVIL,ZESTRIL) 40 MG tablet   Return in about 3 months (around 12/01/2013) for diabetes, hypertension.  Lorayne Marek, MD

## 2013-08-31 NOTE — Patient Instructions (Signed)
DASH Eating Plan DASH stands for "Dietary Approaches to Stop Hypertension." The DASH eating plan is a healthy eating plan that has been shown to reduce high blood pressure (hypertension). Additional health benefits may include reducing the risk of type 2 diabetes mellitus, heart disease, and stroke. The DASH eating plan may also help with weight loss. WHAT DO I NEED TO KNOW ABOUT THE DASH EATING PLAN? For the DASH eating plan, you will follow these general guidelines:  Choose foods with a percent daily value for sodium of less than 5% (as listed on the food label).  Use salt-free seasonings or herbs instead of table salt or sea salt.  Check with your health care provider or pharmacist before using salt substitutes.  Eat lower-sodium products, often labeled as "lower sodium" or "no salt added."  Eat fresh foods.  Eat more vegetables, fruits, and low-fat dairy products.  Choose whole grains. Look for the word "whole" as the first word in the ingredient list.  Choose fish and skinless chicken or turkey more often than red meat. Limit fish, poultry, and meat to 6 oz (170 g) each day.  Limit sweets, desserts, sugars, and sugary drinks.  Choose heart-healthy fats.  Limit cheese to 1 oz (28 g) per day.  Eat more home-cooked food and less restaurant, buffet, and fast food.  Limit fried foods.  Cook foods using methods other than frying.  Limit canned vegetables. If you do use them, rinse them well to decrease the sodium.  When eating at a restaurant, ask that your food be prepared with less salt, or no salt if possible. WHAT FOODS CAN I EAT? Seek help from a dietitian for individual calorie needs. Grains Whole grain or whole wheat bread. Brown rice. Whole grain or whole wheat pasta. Quinoa, bulgur, and whole grain cereals. Low-sodium cereals. Corn or whole wheat flour tortillas. Whole grain cornbread. Whole grain crackers. Low-sodium crackers. Vegetables Fresh or frozen vegetables  (raw, steamed, roasted, or grilled). Low-sodium or reduced-sodium tomato and vegetable juices. Low-sodium or reduced-sodium tomato sauce and paste. Low-sodium or reduced-sodium canned vegetables.  Fruits All fresh, canned (in natural juice), or frozen fruits. Meat and Other Protein Products Ground beef (85% or leaner), grass-fed beef, or beef trimmed of fat. Skinless chicken or turkey. Ground chicken or turkey. Pork trimmed of fat. All fish and seafood. Eggs. Dried beans, peas, or lentils. Unsalted nuts and seeds. Unsalted canned beans. Dairy Low-fat dairy products, such as skim or 1% milk, 2% or reduced-fat cheeses, low-fat ricotta or cottage cheese, or plain low-fat yogurt. Low-sodium or reduced-sodium cheeses. Fats and Oils Tub margarines without trans fats. Light or reduced-fat mayonnaise and salad dressings (reduced sodium). Avocado. Safflower, olive, or canola oils. Natural peanut or almond butter. Other Unsalted popcorn and pretzels. The items listed above may not be a complete list of recommended foods or beverages. Contact your dietitian for more options. WHAT FOODS ARE NOT RECOMMENDED? Grains White bread. White pasta. White rice. Refined cornbread. Bagels and croissants. Crackers that contain trans fat. Vegetables Creamed or fried vegetables. Vegetables in a cheese sauce. Regular canned vegetables. Regular canned tomato sauce and paste. Regular tomato and vegetable juices. Fruits Dried fruits. Canned fruit in light or heavy syrup. Fruit juice. Meat and Other Protein Products Fatty cuts of meat. Ribs, chicken wings, bacon, sausage, bologna, salami, chitterlings, fatback, hot dogs, bratwurst, and packaged luncheon meats. Salted nuts and seeds. Canned beans with salt. Dairy Whole or 2% milk, cream, half-and-half, and cream cheese. Whole-fat or sweetened yogurt. Full-fat   cheeses or blue cheese. Nondairy creamers and whipped toppings. Processed cheese, cheese spreads, or cheese  curds. Condiments Onion and garlic salt, seasoned salt, table salt, and sea salt. Canned and packaged gravies. Worcestershire sauce. Tartar sauce. Barbecue sauce. Teriyaki sauce. Soy sauce, including reduced sodium. Steak sauce. Fish sauce. Oyster sauce. Cocktail sauce. Horseradish. Ketchup and mustard. Meat flavorings and tenderizers. Bouillon cubes. Hot sauce. Tabasco sauce. Marinades. Taco seasonings. Relishes. Fats and Oils Butter, stick margarine, lard, shortening, ghee, and bacon fat. Coconut, palm kernel, or palm oils. Regular salad dressings. Other Pickles and olives. Salted popcorn and pretzels. The items listed above may not be a complete list of foods and beverages to avoid. Contact your dietitian for more information. WHERE CAN I FIND MORE INFORMATION? National Heart, Lung, and Blood Institute: travelstabloid.com Document Released: 02/08/2011 Document Revised: 02/24/2013 Document Reviewed: 12/24/2012 [facility] Patient Information 2015 [place], [place]. This information is not intended to replace advice given to you by your health care provider. Make sure you discuss any questions you have with your health care provider. Diabetes Mellitus and Food It is important for you to manage your blood sugar (glucose) level. Your blood glucose level can be greatly affected by what you eat. Eating healthier foods in the appropriate amounts throughout the day at about the same time each day will help you control your blood glucose level. It can also help slow or prevent worsening of your diabetes mellitus. Healthy eating may even help you improve the level of your blood pressure and reach or maintain a healthy weight.  HOW CAN FOOD AFFECT ME? Carbohydrates Carbohydrates affect your blood glucose level more than any other type of food. Your dietitian will help you determine how many carbohydrates to eat at each meal and teach you how to count carbohydrates. Counting  carbohydrates is important to keep your blood glucose at a healthy level, especially if you are using insulin or taking certain medicines for diabetes mellitus. Alcohol Alcohol can cause sudden decreases in blood glucose (hypoglycemia), especially if you use insulin or take certain medicines for diabetes mellitus. Hypoglycemia can be a life-threatening condition. Symptoms of hypoglycemia (sleepiness, dizziness, and disorientation) are similar to symptoms of having too much alcohol.  If your health care provider has given you approval to drink alcohol, do so in moderation and use the following guidelines:  Women should not have more than one drink per day, and men should not have more than two drinks per day. One drink is equal to:  12 oz of beer.  5 oz of wine.  1 oz of hard liquor.  Do not drink on an empty stomach.  Keep yourself hydrated. Have water, diet soda, or unsweetened iced tea.  Regular soda, juice, and other mixers might contain a lot of carbohydrates and should be counted. WHAT FOODS ARE NOT RECOMMENDED? As you make food choices, it is important to remember that all foods are not the same. Some foods have fewer nutrients per serving than other foods, even though they might have the same number of calories or carbohydrates. It is difficult to get your body what it needs when you eat foods with fewer nutrients. Examples of foods that you should avoid that are high in calories and carbohydrates but low in nutrients include:  Trans fats (most processed foods list trans fats on the Nutrition Facts label).  Regular soda.  Juice.  Candy.  Sweets, such as cake, pie, doughnuts, and cookies.  Fried foods. WHAT FOODS CAN I EAT? Have nutrient-rich foods,  which will nourish your body and keep you healthy. The food you should eat also will depend on several factors, including:  The calories you need.  The medicines you take.  Your weight.  Your blood glucose level.  Your  blood pressure level.  Your cholesterol level. You also should eat a variety of foods, including:  Protein, such as meat, poultry, fish, tofu, nuts, and seeds (lean animal proteins are best).  Fruits.  Vegetables.  Dairy products, such as milk, cheese, and yogurt (low fat is best).  Breads, grains, pasta, cereal, rice, and beans.  Fats such as olive oil, trans fat-free margarine, canola oil, avocado, and olives. DOES EVERYONE WITH DIABETES MELLITUS HAVE THE SAME MEAL PLAN? Because every person with diabetes mellitus is different, there is not one meal plan that works for everyone. It is very important that you meet with a dietitian who will help you create a meal plan that is just right for you. Document Released: 11/16/2004 Document Revised: 02/24/2013 Document Reviewed: 01/16/2013 [employer] Patient Information 2015 [employer]. This information is not intended to replace advice given to you by your health care provider. Make sure you discuss any questions you have with your health care provider.  

## 2013-09-02 ENCOUNTER — Telehealth: Payer: Self-pay | Admitting: *Deleted

## 2013-09-02 NOTE — Telephone Encounter (Signed)
Fax received from [facility] about patient prescription for Atorvastatin (Lipitor). The prescription states Take one tablet (10 mg) by mouth daily. Take two tablets daily. Do you want the patient on 1 tablet a day or 2 tablets a day. Pharmacy contact information is phone [phone], fax [phone].

## 2013-09-03 NOTE — Telephone Encounter (Signed)
Advise patient to take Lipitor 10 mg daily.

## 2013-09-08 NOTE — Telephone Encounter (Signed)
Spoke with Inez Catalina at the [facility] informed her that prescription is for Lipitor 10 mg daily.  I have also contacted the patient as well and he verbalized understanding.

## 2013-09-16 ENCOUNTER — Telehealth: Payer: Self-pay | Admitting: Internal Medicine

## 2013-09-16 ENCOUNTER — Telehealth: Payer: Self-pay

## 2013-09-16 DIAGNOSIS — Z9889 Other specified postprocedural states: Secondary | ICD-10-CM

## 2013-09-16 NOTE — Telephone Encounter (Signed)
Spoke with patient and he is aware referral for eye surgery was put in epic

## 2013-09-16 NOTE — Telephone Encounter (Signed)
Pt requesting referral to [facility], Vevelyn Royals, MD p: [phone] f: [phone]. Pt has appt with them on Sept 9 at 8:00am. Pt last office visit was on 08/31/13. Pt says he needs referral in order for insurance to cover visit.  Please f/u with pt, he is not sure if he needs to schedule Dr appt to have a referral sent.

## 2013-10-04 ENCOUNTER — Other Ambulatory Visit: Payer: Self-pay | Admitting: Internal Medicine

## 2013-10-12 ENCOUNTER — Other Ambulatory Visit: Payer: Self-pay

## 2013-10-12 ENCOUNTER — Telehealth: Payer: Self-pay | Admitting: Internal Medicine

## 2013-10-12 DIAGNOSIS — J441 Chronic obstructive pulmonary disease with (acute) exacerbation: Secondary | ICD-10-CM

## 2013-10-12 MED ORDER — ALBUTEROL SULFATE (2.5 MG/3ML) 0.083% IN NEBU: 2.5000 mg | INHALATION_SOLUTION | Freq: Four times a day (QID) | RESPIRATORY_TRACT | Status: DC | PRN

## 2013-10-12 NOTE — Telephone Encounter (Signed)
Pt. Called back stating that he needs refills for the solution used for the breathing treatments. Would like to be sent to [facility] on [address]. Please f/u with pt.

## 2013-10-12 NOTE — Telephone Encounter (Signed)
Patient called requesting nebulizer refill.  Called this into [facility] per patient request.

## 2013-10-12 NOTE — Telephone Encounter (Signed)
Pt. Calling to request refill on inhaler. Please f/u with pt.

## 2013-10-19 NOTE — Telephone Encounter (Signed)
Medication refilled and e-scribed to [facility] pharmacy

## 2013-10-20 ENCOUNTER — Encounter: Payer: Self-pay | Admitting: *Deleted

## 2013-10-20 NOTE — Progress Notes (Unsigned)
Request for Medical Request received from [facility]. Form placed in bin for medical record request. Vivia Birmingham, RN

## 2013-10-26 ENCOUNTER — Encounter: Payer: Self-pay | Admitting: Internal Medicine

## 2013-10-26 ENCOUNTER — Telehealth: Payer: Self-pay | Admitting: Internal Medicine

## 2013-10-26 NOTE — Telephone Encounter (Signed)
Patient is calling in today to inform PCP that his catarac surgery was denied by his insurance company; patient is calling to explore some more options, please f/u with patient at your earliest convenience;

## 2013-11-04 ENCOUNTER — Other Ambulatory Visit: Payer: Self-pay | Admitting: Internal Medicine

## 2013-11-11 ENCOUNTER — Emergency Department (HOSPITAL_COMMUNITY)
Admission: EM | Admit: 2013-11-11 | Discharge: 2013-11-11 | Disposition: A | Discharge status: Home / Self Care | Payer: Medicare HMO | Attending: Emergency Medicine | Admitting: Emergency Medicine

## 2013-11-11 ENCOUNTER — Encounter (HOSPITAL_COMMUNITY): Payer: Self-pay | Admitting: Emergency Medicine

## 2013-11-11 ENCOUNTER — Emergency Department (HOSPITAL_COMMUNITY): Payer: Medicare HMO

## 2013-11-11 ENCOUNTER — Telehealth: Payer: Self-pay | Admitting: Internal Medicine

## 2013-11-11 DIAGNOSIS — J441 Chronic obstructive pulmonary disease with (acute) exacerbation: Secondary | ICD-10-CM | POA: Insufficient documentation

## 2013-11-11 DIAGNOSIS — Z87891 Personal history of nicotine dependence: Secondary | ICD-10-CM | POA: Diagnosis not present

## 2013-11-11 DIAGNOSIS — E119 Type 2 diabetes mellitus without complications: Secondary | ICD-10-CM | POA: Diagnosis not present

## 2013-11-11 DIAGNOSIS — Z79899 Other long term (current) drug therapy: Secondary | ICD-10-CM | POA: Diagnosis not present

## 2013-11-11 DIAGNOSIS — R0602 Shortness of breath: Secondary | ICD-10-CM | POA: Insufficient documentation

## 2013-11-11 DIAGNOSIS — M129 Arthropathy, unspecified: Secondary | ICD-10-CM | POA: Insufficient documentation

## 2013-11-11 DIAGNOSIS — I2510 Atherosclerotic heart disease of native coronary artery without angina pectoris: Secondary | ICD-10-CM | POA: Diagnosis not present

## 2013-11-11 DIAGNOSIS — R079 Chest pain, unspecified: Secondary | ICD-10-CM | POA: Insufficient documentation

## 2013-11-11 DIAGNOSIS — Z7982 Long term (current) use of aspirin: Secondary | ICD-10-CM | POA: Insufficient documentation

## 2013-11-11 DIAGNOSIS — I10 Essential (primary) hypertension: Secondary | ICD-10-CM | POA: Diagnosis not present

## 2013-11-11 DIAGNOSIS — Z8619 Personal history of other infectious and parasitic diseases: Secondary | ICD-10-CM | POA: Insufficient documentation

## 2013-11-11 DIAGNOSIS — E7800 Pure hypercholesterolemia, unspecified: Secondary | ICD-10-CM | POA: Insufficient documentation

## 2013-11-11 DIAGNOSIS — IMO0002 Reserved for concepts with insufficient information to code with codable children: Secondary | ICD-10-CM | POA: Diagnosis not present

## 2013-11-11 HISTORY — DX: Chronic obstructive pulmonary disease, unspecified: J44.9

## 2013-11-11 LAB — PRO B NATRIURETIC PEPTIDE: Pro B Natriuretic peptide (BNP): 39.1 pg/mL (ref 0–125)

## 2013-11-11 LAB — CBC
HCT: 41.2 % (ref 39.0–52.0)
HEMOGLOBIN: 14.3 g/dL (ref 13.0–17.0)
MCH: 28.9 pg (ref 26.0–34.0)
MCHC: 34.7 g/dL (ref 30.0–36.0)
MCV: 83.4 fL (ref 78.0–100.0)
PLATELETS: 303 10*3/uL (ref 150–400)
RBC: 4.94 MIL/uL (ref 4.22–5.81)
RDW: 13 % (ref 11.5–15.5)
WBC: 6.8 10*3/uL (ref 4.0–10.5)

## 2013-11-11 LAB — BASIC METABOLIC PANEL
Anion gap: 16 — ABNORMAL HIGH (ref 5–15)
BUN: 17 mg/dL (ref 6–23)
CALCIUM: 9.4 mg/dL (ref 8.4–10.5)
CO2: 22 meq/L (ref 19–32)
CREATININE: 0.72 mg/dL (ref 0.50–1.35)
Chloride: 100 mEq/L (ref 96–112)
GFR calc Af Amer: >90 mL/min (ref >90)
GFR calc non Af Amer: >90 mL/min (ref >90)
Glucose, Bld: 115 mg/dL — ABNORMAL HIGH (ref 70–99)
Potassium: 4.5 mEq/L (ref 3.7–5.3)
Sodium: 138 mEq/L (ref 137–147)

## 2013-11-11 LAB — I-STAT TROPONIN, ED: Troponin i, poc: 0.01 ng/mL (ref 0.00–0.08)

## 2013-11-11 MED ORDER — ALBUTEROL SULFATE (2.5 MG/3ML) 0.083% IN NEBU
5.0000 mg | INHALATION_SOLUTION | Freq: Once | RESPIRATORY_TRACT | Status: AC
  Administered 2013-11-11: 5 mg via RESPIRATORY_TRACT
  Filled 2013-11-11: qty 6

## 2013-11-11 MED ORDER — IPRATROPIUM BROMIDE 0.02 % IN SOLN
0.5000 mg | Freq: Once | RESPIRATORY_TRACT | Status: AC
  Administered 2013-11-11: 0.5 mg via RESPIRATORY_TRACT
  Filled 2013-11-11: qty 2.5

## 2013-11-11 MED ORDER — IPRATROPIUM BROMIDE 0.02 % IN SOLN
0.5000 mg | Freq: Once | RESPIRATORY_TRACT | Status: AC
  Administered 2013-11-11: 0.5 mg via RESPIRATORY_TRACT

## 2013-11-11 MED ORDER — ALBUTEROL SULFATE (2.5 MG/3ML) 0.083% IN NEBU
5.0000 mg | INHALATION_SOLUTION | Freq: Once | RESPIRATORY_TRACT | Status: AC
  Administered 2013-11-11: 5 mg via RESPIRATORY_TRACT

## 2013-11-11 MED ORDER — IPRATROPIUM BROMIDE 0.02 % IN SOLN
RESPIRATORY_TRACT | Status: AC
  Filled 2013-11-11: qty 2.5

## 2013-11-11 MED ORDER — IPRATROPIUM BROMIDE HFA 17 MCG/ACT IN AERS: 2.0000 | INHALATION_SPRAY | Freq: Once | RESPIRATORY_TRACT | Status: DC

## 2013-11-11 MED ORDER — ALBUTEROL SULFATE (2.5 MG/3ML) 0.083% IN NEBU
INHALATION_SOLUTION | RESPIRATORY_TRACT | Status: AC
  Filled 2013-11-11: qty 6

## 2013-11-11 MED ORDER — PREDNISONE 20 MG PO TABS
60.0000 mg | ORAL_TABLET | Freq: Once | ORAL | Status: AC
  Administered 2013-11-11: 60 mg via ORAL
  Filled 2013-11-11: qty 3

## 2013-11-11 MED ORDER — PREDNISONE 20 MG PO TABS: 60.0000 mg | ORAL_TABLET | Freq: Every day | ORAL | Status: DC

## 2013-11-11 NOTE — ED Provider Notes (Signed)
CSN: [id number]     Arrival date & time 11/11/13  1305 History   First MD Initiated Contact with Patient 11/11/13 1524     Chief Complaint  Patient presents with  . Chest Pain  . Shortness of Breath     (Consider location/radiation/quality/duration/timing/severity/associated sxs/prior Treatment) Patient is a 63 y.o. male presenting with shortness of breath. The history is provided by the patient. No language interpreter was used.  Shortness of Breath Severity:  Moderate Onset quality:  Gradual Duration:  1 week Associated symptoms: chest pain, cough and wheezing   Associated symptoms: no abdominal pain, no fever, no rash, no sore throat and no vomiting   Associated symptoms comment:  He has a history of COPD, stopped smoking in January of this year. He reports that he has had progressive symptoms of wheezing and SOB requiring nebulizer treatments at home that started in July. For the past week, he has had increased cough, worse at night, and wheezing with SOB. He reports the cough is causing chest discomfort. No fever. His cough is productive only after a neb treatment.    Past Medical History  Diagnosis Date  . Diabetes mellitus   . Hypertension   . Coronary artery disease   . Hepatitis C   . High cholesterol   . Arthritis   . Hepatitis   . COPD (chronic obstructive pulmonary disease)    Past Surgical History  Procedure Laterality Date  . Esophagogastroduodenoscopy  02/09/2011    Procedure: ESOPHAGOGASTRODUODENOSCOPY (EGD);  Surgeon: Missy Sabins, MD;  Location: [facility] ENDOSCOPY;  Service: Endoscopy;  Laterality: N/A;  . Joint replacement  10/22/2009    knee   Family History  Problem Relation Age of Onset  . Stroke Mother   . Diabetes Mother   . Hypertension Mother   . Hyperlipidemia Mother   . Heart disease Father   . Hypertension Father   . Heart attack Father    History  Substance Use Topics  . Smoking status: Former Smoker -- 0.25 packs/day for 30 years    Types:  Cigarettes    Quit date: 03/12/2013  . Smokeless tobacco: Not on file     Comment: pt states that he will be trying to quit 03/05/2012  . 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  HENT: Negative for congestion, sore throat and trouble swallowing.   Respiratory: Positive for cough, shortness of breath and wheezing.   Cardiovascular: Positive for chest pain. Negative for leg swelling.  Gastrointestinal: Negative.  Negative for nausea, vomiting and abdominal pain.  Musculoskeletal: Negative.  Negative for myalgias.  Skin: Negative.  Negative for rash.  Neurological: Negative.  Negative for weakness and light-headedness.      Allergies  Mobic  Home Medications   Prior to Admission medications   Medication Sig Start Date End Date Taking? Authorizing Provider  albuterol (PROVENTIL) (2.5 MG/3ML) 0.083% nebulizer solution Take 3 mLs (2.5 mg total) by nebulization every 6 (six) hours as needed for wheezing or shortness of breath. 10/12/13  Yes Lorayne Marek, MD  aspirin EC 81 MG tablet Take 1 tablet (81 mg total) by mouth daily. 08/31/13  Yes Lorayne Marek, MD  atorvastatin (LIPITOR) 10 MG tablet Take 10 mg by mouth daily at 6 PM.   Yes Historical Provider, MD  beclomethasone (QVAR) 80 MCG/ACT inhaler Inhale 2 puffs into the lungs 2 (two) times daily. 08/31/13  Yes  Lorayne Marek, MD  cholecalciferol (VITAMIN D) 1000 UNITS tablet Take 1 tablet (1,000 Units total) by mouth daily. 08/20/12  Yes Robbie Lis, MD  cilostazol (PLETAL) 100 MG tablet Take 100 mg by mouth 2 (two) times daily.   Yes Historical Provider, MD  gabapentin (NEURONTIN) 100 MG capsule Take 1 capsule (100 mg total) by mouth 3 (three) times daily. 08/31/13  Yes Lorayne Marek, MD  glipiZIDE (GLUCOTROL) 5 MG tablet Take 1 tablet (5 mg total) by mouth 2 (two) times daily before a meal. 08/31/13  Yes Deepak Advani, MD  guaiFENesin-codeine 100-10 MG/5ML  syrup Take 10 mLs by mouth 3 (three) times daily as needed for cough. 08/18/13  Yes Tresa Garter, MD  hydrOXYzine (ATARAX/VISTARIL) 25 MG tablet Take 25 mg by mouth every 8 (eight) hours as needed for anxiety.   Yes Historical Provider, MD  lisinopril (PRINIVIL,ZESTRIL) 40 MG tablet Take 1 tablet (40 mg total) by mouth daily. 08/31/13  Yes Lorayne Marek, MD  metFORMIN (GLUCOPHAGE) 1000 MG tablet Take 1 tablet (1,000 mg total) by mouth 2 (two) times daily with a meal. 08/31/13  Yes Deepak Advani, MD  omeprazole (PRILOSEC) 40 MG capsule Take 1 capsule (40 mg total) by mouth daily. 08/31/13  Yes Lorayne Marek, MD  rosuvastatin (CRESTOR) 20 MG tablet Take 1 tablet (20 mg total) by mouth daily. 06/26/13  Yes Deepak Advani, MD   BP 132/82  Pulse 98  Temp(Src) 98.3 F (36.8 C) (Oral)  Resp 17  SpO2 97% Physical Exam  Constitutional: He is oriented to person, place, and time. He appears well-developed and well-nourished.  HENT:  Head: Normocephalic.  Eyes: Conjunctivae are normal.  Neck: Normal range of motion. Neck supple.  Cardiovascular: Normal rate and regular rhythm.   No murmur heard. Pulmonary/Chest: Effort normal. He has wheezes. He has no rales. He exhibits no tenderness.  Abdominal: Soft. Bowel sounds are normal. There is no tenderness. There is no rebound and no guarding.  Musculoskeletal: Normal range of motion. He exhibits no edema.  Neurological: He is alert and oriented to person, place, and time.  Skin: Skin is warm and dry. No rash noted.  Psychiatric: He has a normal mood and affect.    ED Course  Procedures (including critical care time) Labs Review Labs Reviewed  BASIC METABOLIC PANEL - Abnormal; Notable for the following:    Glucose, Bld 115 (*)    Anion gap 16 (*)    All other components within normal limits  CBC  PRO B NATRIURETIC PEPTIDE  I-STAT TROPOININ, ED    Imaging Review Dg Chest 2 View  11/11/2013   CLINICAL DATA:  Chest pain and difficulty  breathing  EXAM: CHEST  2 VIEW  COMPARISON:  August 18, 2013  FINDINGS: There is no edema or consolidation. The heart size and pulmonary vascularity are normal. No adenopathy. No bone lesions.  IMPRESSION: No edema or consolidation.   Electronically Signed   By: Lowella Grip M.D.   On: 11/11/2013 13:55     EKG Interpretation   Date/Time:  Wednesday November 11 2013 13:12:40 EDT Ventricular Rate:  111 PR Interval:  140 QRS Duration: 70 QT Interval:  320 QTC Calculation: 435 R Axis:   79 Text Interpretation:  Sinus tachycardia Otherwise normal ECG Confirmed by  ZACKOWSKI  MD, SCOTT ([id number]) on 11/11/2013 3:26:33 PM      MDM   Final diagnoses:  None    1. COPD exacerbation  He has steadily improved over time with  multiple nebulizer treatments. Steroids started. No hypoxia. He is evaluated by Dr. Mingo Amber. He has follow up established in the community. Will continue steroids over the next 3 days and encourage PCP follow up.    Dewaine Oats, PA-C 11/11/13 1805

## 2013-11-11 NOTE — ED Notes (Signed)
Pt finished nebulizer treatment. sts he is feeling a lot better and ready to go home.

## 2013-11-11 NOTE — ED Notes (Signed)
Pt c/o increased SOB and some CP with cough; pt labored with expiratory wheezes

## 2013-11-11 NOTE — Discharge Instructions (Signed)

## 2013-11-11 NOTE — ED Provider Notes (Signed)
Medical screening examination/treatment/procedure(s) were conducted as a shared visit with non-physician practitioner(s) and myself.  I personally evaluated the patient during the encounter.   EKG Interpretation   Date/Time:  Wednesday November 11 2013 13:12:40 EDT Ventricular Rate:  111 PR Interval:  140 QRS Duration: 70 QT Interval:  320 QTC Calculation: 435 R Axis:   79 Text Interpretation:  Sinus tachycardia Otherwise normal ECG Confirmed by  ZACKOWSKI  MD, SCOTT [phone]) on 11/11/2013 3:26:33 PM      [name] is here with SOB, hx of COPD. He greatly improved after nebulizers with almost no wheezing, no SOB, talking in complete sentences. Stable for discharge.  Evelina Bucy, MD 11/11/13 [phone]

## 2013-11-11 NOTE — ED Notes (Signed)
Pt c/o sob x 1 week then the past couple of days started having CP when he coughs. Hx of asthma and copd. Pt sts he has been taking cough medicine and a nebulizer at home but hasn't had much relief. sts about 4-5 months ago he started having increased asthma attacks and feeling sob. Reports productive cough with clear sputum. Nad, skin warm and dry, resp e/u.

## 2013-11-11 NOTE — Telephone Encounter (Signed)
Pt. Called stating that he is short of breath even with breathing treatments. Pt stated that he would go to urgent care to help him with this problem. Please f/u with pt.

## 2013-11-12 ENCOUNTER — Other Ambulatory Visit: Payer: Self-pay | Admitting: Internal Medicine

## 2013-11-18 ENCOUNTER — Encounter (HOSPITAL_COMMUNITY): Payer: No Typology Code available for payment source

## 2013-11-18 ENCOUNTER — Ambulatory Visit: Payer: No Typology Code available for payment source | Admitting: Vascular Surgery

## 2013-12-01 ENCOUNTER — Ambulatory Visit: Payer: Medicare PPO | Admitting: Internal Medicine

## 2013-12-01 ENCOUNTER — Emergency Department (HOSPITAL_COMMUNITY): Payer: Medicare HMO

## 2013-12-01 ENCOUNTER — Inpatient Hospital Stay (HOSPITAL_COMMUNITY)
Admission: EM | Admit: 2013-12-01 | Discharge: 2013-12-04 | DRG: 191 | Disposition: A | Discharge status: Home / Self Care | Payer: Medicare HMO | Attending: Internal Medicine | Admitting: Internal Medicine

## 2013-12-01 ENCOUNTER — Encounter (HOSPITAL_COMMUNITY): Payer: Self-pay | Admitting: Emergency Medicine

## 2013-12-01 DIAGNOSIS — E119 Type 2 diabetes mellitus without complications: Secondary | ICD-10-CM

## 2013-12-01 DIAGNOSIS — E785 Hyperlipidemia, unspecified: Secondary | ICD-10-CM | POA: Diagnosis present

## 2013-12-01 DIAGNOSIS — I10 Essential (primary) hypertension: Secondary | ICD-10-CM | POA: Diagnosis present

## 2013-12-01 DIAGNOSIS — I739 Peripheral vascular disease, unspecified: Secondary | ICD-10-CM | POA: Diagnosis present

## 2013-12-01 DIAGNOSIS — J45901 Unspecified asthma with (acute) exacerbation: Secondary | ICD-10-CM

## 2013-12-01 DIAGNOSIS — Z823 Family history of stroke: Secondary | ICD-10-CM

## 2013-12-01 DIAGNOSIS — Z8249 Family history of ischemic heart disease and other diseases of the circulatory system: Secondary | ICD-10-CM | POA: Diagnosis not present

## 2013-12-01 DIAGNOSIS — R0602 Shortness of breath: Secondary | ICD-10-CM | POA: Diagnosis not present

## 2013-12-01 DIAGNOSIS — F32A Depression, unspecified: Secondary | ICD-10-CM

## 2013-12-01 DIAGNOSIS — M1990 Unspecified osteoarthritis, unspecified site: Secondary | ICD-10-CM

## 2013-12-01 DIAGNOSIS — F17200 Nicotine dependence, unspecified, uncomplicated: Secondary | ICD-10-CM

## 2013-12-01 DIAGNOSIS — E1165 Type 2 diabetes mellitus with hyperglycemia: Secondary | ICD-10-CM | POA: Diagnosis present

## 2013-12-01 DIAGNOSIS — B1920 Unspecified viral hepatitis C without hepatic coma: Secondary | ICD-10-CM | POA: Diagnosis present

## 2013-12-01 DIAGNOSIS — J441 Chronic obstructive pulmonary disease with (acute) exacerbation: Secondary | ICD-10-CM | POA: Diagnosis not present

## 2013-12-01 DIAGNOSIS — K226 Gastro-esophageal laceration-hemorrhage syndrome: Secondary | ICD-10-CM

## 2013-12-01 DIAGNOSIS — Z833 Family history of diabetes mellitus: Secondary | ICD-10-CM | POA: Diagnosis not present

## 2013-12-01 DIAGNOSIS — E7800 Pure hypercholesterolemia, unspecified: Secondary | ICD-10-CM | POA: Diagnosis not present

## 2013-12-01 DIAGNOSIS — J4531 Mild persistent asthma with (acute) exacerbation: Secondary | ICD-10-CM

## 2013-12-01 DIAGNOSIS — Z87891 Personal history of nicotine dependence: Secondary | ICD-10-CM | POA: Diagnosis not present

## 2013-12-01 DIAGNOSIS — Z7982 Long term (current) use of aspirin: Secondary | ICD-10-CM | POA: Diagnosis not present

## 2013-12-01 DIAGNOSIS — E089 Diabetes mellitus due to underlying condition without complications: Secondary | ICD-10-CM

## 2013-12-01 DIAGNOSIS — IMO0002 Reserved for concepts with insufficient information to code with codable children: Secondary | ICD-10-CM | POA: Diagnosis not present

## 2013-12-01 DIAGNOSIS — I2510 Atherosclerotic heart disease of native coronary artery without angina pectoris: Secondary | ICD-10-CM | POA: Diagnosis present

## 2013-12-01 DIAGNOSIS — Z7952 Long term (current) use of systemic steroids: Secondary | ICD-10-CM | POA: Diagnosis not present

## 2013-12-01 DIAGNOSIS — N289 Disorder of kidney and ureter, unspecified: Secondary | ICD-10-CM

## 2013-12-01 DIAGNOSIS — F329 Major depressive disorder, single episode, unspecified: Secondary | ICD-10-CM

## 2013-12-01 DIAGNOSIS — R062 Wheezing: Secondary | ICD-10-CM

## 2013-12-01 DIAGNOSIS — Z79899 Other long term (current) drug therapy: Secondary | ICD-10-CM | POA: Diagnosis not present

## 2013-12-01 DIAGNOSIS — R21 Rash and other nonspecific skin eruption: Secondary | ICD-10-CM

## 2013-12-01 DIAGNOSIS — IMO0001 Reserved for inherently not codable concepts without codable children: Secondary | ICD-10-CM | POA: Diagnosis not present

## 2013-12-01 DIAGNOSIS — Z96659 Presence of unspecified artificial knee joint: Secondary | ICD-10-CM | POA: Diagnosis not present

## 2013-12-01 LAB — COMPREHENSIVE METABOLIC PANEL
ALT: 111 U/L — ABNORMAL HIGH (ref 0–53)
AST: 73 U/L — ABNORMAL HIGH (ref 0–37)
Albumin: 3.9 g/dL (ref 3.5–5.2)
Alkaline Phosphatase: 58 U/L (ref 39–117)
Anion gap: 21 — ABNORMAL HIGH (ref 5–15)
BUN: 18 mg/dL (ref 6–23)
CALCIUM: 9.4 mg/dL (ref 8.4–10.5)
CHLORIDE: 96 meq/L (ref 96–112)
CO2: 21 meq/L (ref 19–32)
CREATININE: 0.68 mg/dL (ref 0.50–1.35)
GFR calc non Af Amer: >90 mL/min (ref >90)
GLUCOSE: 278 mg/dL — AB (ref 70–99)
Potassium: 4.6 mEq/L (ref 3.7–5.3)
Sodium: 138 mEq/L (ref 137–147)
Total Bilirubin: 0.4 mg/dL (ref 0.3–1.2)
Total Protein: 7.4 g/dL (ref 6.0–8.3)

## 2013-12-01 LAB — CBC WITH DIFFERENTIAL/PLATELET
Basophils Absolute: 0 10*3/uL (ref 0.0–0.1)
Basophils Relative: 1 % (ref 0–1)
EOS PCT: 10 % — AB (ref 0–5)
Eosinophils Absolute: 0.7 10*3/uL (ref 0.0–0.7)
HCT: 42.1 % (ref 39.0–52.0)
HEMOGLOBIN: 14 g/dL (ref 13.0–17.0)
LYMPHS ABS: 3.9 10*3/uL (ref 0.7–4.0)
LYMPHS PCT: 57 % — AB (ref 12–46)
MCH: 28.2 pg (ref 26.0–34.0)
MCHC: 33.3 g/dL (ref 30.0–36.0)
MCV: 84.7 fL (ref 78.0–100.0)
MONO ABS: 0.5 10*3/uL (ref 0.1–1.0)
MONOS PCT: 7 % (ref 3–12)
Neutro Abs: 1.7 10*3/uL (ref 1.7–7.7)
Neutrophils Relative %: 25 % — ABNORMAL LOW (ref 43–77)
Platelets: 230 10*3/uL (ref 150–400)
RBC: 4.97 MIL/uL (ref 4.22–5.81)
RDW: 13 % (ref 11.5–15.5)
WBC: 6.8 10*3/uL (ref 4.0–10.5)

## 2013-12-01 LAB — BASIC METABOLIC PANEL
Anion gap: 19 — ABNORMAL HIGH (ref 5–15)
BUN: 16 mg/dL (ref 6–23)
CALCIUM: 9.4 mg/dL (ref 8.4–10.5)
CO2: 21 meq/L (ref 19–32)
CREATININE: 0.73 mg/dL (ref 0.50–1.35)
Chloride: 99 mEq/L (ref 96–112)
GFR calc Af Amer: >90 mL/min (ref >90)
GFR calc non Af Amer: >90 mL/min (ref >90)
GLUCOSE: 120 mg/dL — AB (ref 70–99)
Potassium: 3.8 mEq/L (ref 3.7–5.3)
Sodium: 139 mEq/L (ref 137–147)

## 2013-12-01 LAB — LIPASE, BLOOD: Lipase: 20 U/L (ref 11–59)

## 2013-12-01 LAB — GLUCOSE, CAPILLARY
GLUCOSE-CAPILLARY: 307 mg/dL — AB (ref 70–99)
GLUCOSE-CAPILLARY: 351 mg/dL — AB (ref 70–99)
Glucose-Capillary: 254 mg/dL — ABNORMAL HIGH (ref 70–99)
Glucose-Capillary: 283 mg/dL — ABNORMAL HIGH (ref 70–99)

## 2013-12-01 LAB — HEMOGLOBIN A1C
HEMOGLOBIN A1C: 8.6 % — AB (ref <5.7)
MEAN PLASMA GLUCOSE: 200 mg/dL — AB (ref <117)

## 2013-12-01 LAB — PRO B NATRIURETIC PEPTIDE: Pro B Natriuretic peptide (BNP): 70.7 pg/mL (ref 0–125)

## 2013-12-01 LAB — I-STAT TROPONIN, ED: Troponin i, poc: 0 ng/mL (ref 0.00–0.08)

## 2013-12-01 LAB — TROPONIN I: Troponin I: <0.3 ng/mL (ref <0.30)

## 2013-12-01 LAB — TSH: TSH: 0.666 u[IU]/mL (ref 0.350–4.500)

## 2013-12-01 MED ORDER — INSULIN ASPART 100 UNIT/ML ~~LOC~~ SOLN
0.0000 [IU] | Freq: Three times a day (TID) | SUBCUTANEOUS | Status: DC
  Administered 2013-12-01: 7 [IU] via SUBCUTANEOUS

## 2013-12-01 MED ORDER — LEVOFLOXACIN IN D5W 500 MG/100ML IV SOLN
500.0000 mg | INTRAVENOUS | Status: DC
  Administered 2013-12-01 – 2013-12-02 (×2): 500 mg via INTRAVENOUS
  Filled 2013-12-01 (×2): qty 100

## 2013-12-01 MED ORDER — PNEUMOCOCCAL VAC POLYVALENT 25 MCG/0.5ML IJ INJ
0.5000 mL | INJECTION | INTRAMUSCULAR | Status: AC
  Administered 2013-12-02: 0.5 mL via INTRAMUSCULAR
  Filled 2013-12-01 (×2): qty 0.5

## 2013-12-01 MED ORDER — INSULIN ASPART 100 UNIT/ML ~~LOC~~ SOLN
0.0000 [IU] | Freq: Every day | SUBCUTANEOUS | Status: DC
  Administered 2013-12-01: 3 [IU] via SUBCUTANEOUS
  Administered 2013-12-02: 23:00:00 via SUBCUTANEOUS

## 2013-12-01 MED ORDER — BENZONATATE 100 MG PO CAPS
100.0000 mg | ORAL_CAPSULE | Freq: Once | ORAL | Status: AC
  Administered 2013-12-01: 100 mg via ORAL
  Filled 2013-12-01: qty 1

## 2013-12-01 MED ORDER — ALBUTEROL SULFATE (2.5 MG/3ML) 0.083% IN NEBU
2.5000 mg | INHALATION_SOLUTION | RESPIRATORY_TRACT | Status: DC
  Administered 2013-12-01: 2.5 mg via RESPIRATORY_TRACT
  Filled 2013-12-01: qty 3

## 2013-12-01 MED ORDER — ACETAMINOPHEN 650 MG RE SUPP
650.0000 mg | Freq: Four times a day (QID) | RECTAL | Status: DC | PRN
Start: 2013-12-01 — End: 2013-12-04

## 2013-12-01 MED ORDER — GABAPENTIN 100 MG PO CAPS
100.0000 mg | ORAL_CAPSULE | Freq: Three times a day (TID) | ORAL | Status: DC
  Administered 2013-12-01 – 2013-12-04 (×10): 100 mg via ORAL
  Filled 2013-12-01 (×13): qty 1

## 2013-12-01 MED ORDER — GUAIFENESIN-CODEINE 100-10 MG/5ML PO SOLN: 10.0000 mL | Freq: Three times a day (TID) | ORAL | Status: DC | PRN

## 2013-12-01 MED ORDER — ATORVASTATIN CALCIUM 40 MG PO TABS: 40.0000 mg | ORAL_TABLET | Freq: Every day | ORAL | Status: DC

## 2013-12-01 MED ORDER — ACETAMINOPHEN 325 MG PO TABS: 650.0000 mg | ORAL_TABLET | Freq: Four times a day (QID) | ORAL | Status: DC | PRN

## 2013-12-01 MED ORDER — ASPIRIN EC 81 MG PO TBEC
81.0000 mg | DELAYED_RELEASE_TABLET | Freq: Every day | ORAL | Status: DC
  Administered 2013-12-01 – 2013-12-04 (×4): 81 mg via ORAL
  Filled 2013-12-01 (×4): qty 1

## 2013-12-01 MED ORDER — PANTOPRAZOLE SODIUM 40 MG PO TBEC
40.0000 mg | DELAYED_RELEASE_TABLET | Freq: Two times a day (BID) | ORAL | Status: DC
  Administered 2013-12-01 – 2013-12-04 (×7): 40 mg via ORAL
  Filled 2013-12-01 (×3): qty 1

## 2013-12-01 MED ORDER — ALBUTEROL SULFATE (2.5 MG/3ML) 0.083% IN NEBU
2.5000 mg | INHALATION_SOLUTION | RESPIRATORY_TRACT | Status: DC | PRN
  Filled 2013-12-01: qty 3

## 2013-12-01 MED ORDER — ALBUTEROL SULFATE (2.5 MG/3ML) 0.083% IN NEBU
2.5000 mg | INHALATION_SOLUTION | Freq: Two times a day (BID) | RESPIRATORY_TRACT | Status: DC
  Administered 2013-12-02 – 2013-12-04 (×5): 2.5 mg via RESPIRATORY_TRACT
  Filled 2013-12-01 (×5): qty 3

## 2013-12-01 MED ORDER — IPRATROPIUM BROMIDE 0.02 % IN SOLN
0.5000 mg | RESPIRATORY_TRACT | Status: DC
  Administered 2013-12-01: 0.5 mg via RESPIRATORY_TRACT
  Filled 2013-12-01: qty 2.5

## 2013-12-01 MED ORDER — ONDANSETRON HCL 4 MG/2ML IJ SOLN: 4.0000 mg | Freq: Four times a day (QID) | INTRAMUSCULAR | Status: DC | PRN

## 2013-12-01 MED ORDER — ALBUTEROL SULFATE (2.5 MG/3ML) 0.083% IN NEBU
2.5000 mg | INHALATION_SOLUTION | Freq: Four times a day (QID) | RESPIRATORY_TRACT | Status: DC
  Administered 2013-12-01 (×3): 2.5 mg via RESPIRATORY_TRACT
  Filled 2013-12-01 (×3): qty 3

## 2013-12-01 MED ORDER — PANTOPRAZOLE SODIUM 40 MG PO TBEC: 40.0000 mg | DELAYED_RELEASE_TABLET | Freq: Every day | ORAL | Status: DC

## 2013-12-01 MED ORDER — BENZONATATE 100 MG PO CAPS
100.0000 mg | ORAL_CAPSULE | Freq: Three times a day (TID) | ORAL | Status: DC
  Administered 2013-12-01 – 2013-12-04 (×10): 100 mg via ORAL
  Filled 2013-12-01 (×14): qty 1

## 2013-12-01 MED ORDER — ATORVASTATIN CALCIUM 10 MG PO TABS: 10.0000 mg | ORAL_TABLET | Freq: Every day | ORAL | Status: DC

## 2013-12-01 MED ORDER — INFLUENZA VAC SPLIT QUAD 0.5 ML IM SUSY
0.5000 mL | PREFILLED_SYRINGE | INTRAMUSCULAR | Status: AC
  Administered 2013-12-02: 0.5 mL via INTRAMUSCULAR
  Filled 2013-12-01: qty 0.5

## 2013-12-01 MED ORDER — SODIUM CHLORIDE 0.9 % IJ SOLN
3.0000 mL | Freq: Two times a day (BID) | INTRAMUSCULAR | Status: DC
  Administered 2013-12-01 – 2013-12-04 (×6): 3 mL via INTRAVENOUS

## 2013-12-01 MED ORDER — IPRATROPIUM-ALBUTEROL 0.5-2.5 (3) MG/3ML IN SOLN: 3.0000 mL | Freq: Two times a day (BID) | RESPIRATORY_TRACT | Status: DC

## 2013-12-01 MED ORDER — GLIPIZIDE 5 MG PO TABS
5.0000 mg | ORAL_TABLET | Freq: Two times a day (BID) | ORAL | Status: DC
  Administered 2013-12-01 – 2013-12-02 (×3): 5 mg via ORAL
  Filled 2013-12-01 (×5): qty 1

## 2013-12-01 MED ORDER — BUDESONIDE 0.25 MG/2ML IN SUSP
0.2500 mg | Freq: Two times a day (BID) | RESPIRATORY_TRACT | Status: DC
  Administered 2013-12-01 – 2013-12-04 (×7): 0.25 mg via RESPIRATORY_TRACT
  Filled 2013-12-01 (×9): qty 2

## 2013-12-01 MED ORDER — METHYLPREDNISOLONE SODIUM SUCC 40 MG IJ SOLR
40.0000 mg | Freq: Two times a day (BID) | INTRAMUSCULAR | Status: DC
Start: 2013-12-01 — End: 2013-12-01
  Administered 2013-12-01: 40 mg via INTRAVENOUS
  Filled 2013-12-01 (×3): qty 1

## 2013-12-01 MED ORDER — LISINOPRIL 40 MG PO TABS
40.0000 mg | ORAL_TABLET | Freq: Every day | ORAL | Status: DC
  Administered 2013-12-01 – 2013-12-04 (×4): 40 mg via ORAL
  Filled 2013-12-01 (×4): qty 1

## 2013-12-01 MED ORDER — ZOLPIDEM TARTRATE 5 MG PO TABS
5.0000 mg | ORAL_TABLET | Freq: Every day | ORAL | Status: DC
  Administered 2013-12-01 – 2013-12-03 (×3): 5 mg via ORAL
  Filled 2013-12-01 (×3): qty 1

## 2013-12-01 MED ORDER — ENOXAPARIN SODIUM 40 MG/0.4ML ~~LOC~~ SOLN
40.0000 mg | SUBCUTANEOUS | Status: DC
  Administered 2013-12-01: 40 mg via SUBCUTANEOUS
  Filled 2013-12-01 (×2): qty 0.4

## 2013-12-01 MED ORDER — INSULIN ASPART 100 UNIT/ML ~~LOC~~ SOLN
0.0000 [IU] | Freq: Three times a day (TID) | SUBCUTANEOUS | Status: DC
  Administered 2013-12-01: 11 [IU] via SUBCUTANEOUS
  Administered 2013-12-01 – 2013-12-02 (×2): 20 [IU] via SUBCUTANEOUS
  Administered 2013-12-02: 15 [IU] via SUBCUTANEOUS
  Administered 2013-12-02: 4 [IU] via SUBCUTANEOUS
  Administered 2013-12-03: 15 [IU] via SUBCUTANEOUS
  Administered 2013-12-03: 8 [IU] via SUBCUTANEOUS
  Administered 2013-12-03: 15 [IU] via SUBCUTANEOUS
  Administered 2013-12-04: 20 [IU] via SUBCUTANEOUS
  Administered 2013-12-04: 11 [IU] via SUBCUTANEOUS

## 2013-12-01 MED ORDER — ONDANSETRON HCL 4 MG PO TABS: 4.0000 mg | ORAL_TABLET | Freq: Four times a day (QID) | ORAL | Status: DC | PRN

## 2013-12-01 MED ORDER — VITAMIN D3 25 MCG (1000 UNIT) PO TABS
1000.0000 [IU] | ORAL_TABLET | Freq: Every day | ORAL | Status: DC
  Administered 2013-12-01 – 2013-12-04 (×4): 1000 [IU] via ORAL
  Filled 2013-12-01 (×4): qty 1

## 2013-12-01 MED ORDER — IPRATROPIUM BROMIDE 0.02 % IN SOLN
0.5000 mg | Freq: Once | RESPIRATORY_TRACT | Status: AC
  Administered 2013-12-01: 0.5 mg via RESPIRATORY_TRACT
  Filled 2013-12-01: qty 2.5

## 2013-12-01 MED ORDER — ALBUTEROL (5 MG/ML) CONTINUOUS INHALATION SOLN
10.0000 mg/h | INHALATION_SOLUTION | RESPIRATORY_TRACT | Status: DC
  Administered 2013-12-01: 10 mg/h via RESPIRATORY_TRACT
  Filled 2013-12-01: qty 20

## 2013-12-01 MED ORDER — CILOSTAZOL 100 MG PO TABS
100.0000 mg | ORAL_TABLET | Freq: Two times a day (BID) | ORAL | Status: DC
  Administered 2013-12-01 – 2013-12-04 (×7): 100 mg via ORAL
  Filled 2013-12-01 (×8): qty 1

## 2013-12-01 MED ORDER — METHYLPREDNISOLONE SODIUM SUCC 40 MG IJ SOLR
40.0000 mg | Freq: Three times a day (TID) | INTRAMUSCULAR | Status: DC
Start: 2013-12-01 — End: 2013-12-03
  Administered 2013-12-01 – 2013-12-03 (×6): 40 mg via INTRAVENOUS
  Filled 2013-12-01 (×8): qty 1

## 2013-12-01 MED ORDER — TIOTROPIUM BROMIDE MONOHYDRATE 18 MCG IN CAPS
18.0000 ug | ORAL_CAPSULE | Freq: Every day | RESPIRATORY_TRACT | Status: DC
  Administered 2013-12-01 – 2013-12-04 (×4): 18 ug via RESPIRATORY_TRACT
  Filled 2013-12-01: qty 5

## 2013-12-01 MED ORDER — HYDROXYZINE HCL 25 MG PO TABS: 25.0000 mg | ORAL_TABLET | Freq: Three times a day (TID) | ORAL | Status: DC | PRN

## 2013-12-01 MED ORDER — ATORVASTATIN CALCIUM 10 MG PO TABS
10.0000 mg | ORAL_TABLET | Freq: Every day | ORAL | Status: DC
  Administered 2013-12-01 – 2013-12-03 (×3): 10 mg via ORAL
  Filled 2013-12-01 (×5): qty 1

## 2013-12-01 MED ORDER — HYDROCOD POLST-CHLORPHEN POLST 10-8 MG/5ML PO LQCR
5.0000 mL | Freq: Two times a day (BID) | ORAL | Status: DC
  Administered 2013-12-01 – 2013-12-04 (×7): 5 mL via ORAL
  Filled 2013-12-01 (×6): qty 5

## 2013-12-01 MED ORDER — MENTHOL 3 MG MT LOZG: 1.0000 | LOZENGE | OROMUCOSAL | Status: DC | PRN

## 2013-12-01 MED ORDER — LORATADINE 10 MG PO TABS
10.0000 mg | ORAL_TABLET | Freq: Every day | ORAL | Status: DC
  Administered 2013-12-01 – 2013-12-04 (×4): 10 mg via ORAL
  Filled 2013-12-01 (×4): qty 1

## 2013-12-01 NOTE — ED Provider Notes (Signed)
Care as soon from prior team.  Patient arrives via EMS with shortness of breath.  Seen in the emergency department recently with same, had improvement after neb treatments here.  Patient initially improved at home while on steroids, but has had worsening shortness of breath and cough despite every 6 hour nebs.  Patient is awaiting chest x-ray labs and hour long neb.  Expect patient will need admission to the hospital for management of COPD exacerbation  Kalman Drape, MD 12/01/13 [phone]

## 2013-12-01 NOTE — ED Notes (Addendum)
Pt presents to ED via EMS with c/o SOB and cough for 20 days. Wheeze noted. Per EMS, pt given albuterol 5mg , atrovent 0.5mg  and soulmederol 125mg .

## 2013-12-01 NOTE — ED Notes (Signed)
Kakrakandy, MD at bedside 

## 2013-12-01 NOTE — ED Notes (Signed)
Pt monitored by pulse ox, bp cuff, and 12-lead. 

## 2013-12-01 NOTE — Progress Notes (Signed)
Patient seen and examined, briefly 63 year old male with history of COPD/asthma, diabetes, hyperlipidemia follows [place], presented with increasing shortness of breath and wheezing over the last few days. Patient was seen in the ED on 9/9 for the same as well. Admitted by Dr Hal Hope this am.   BP 141/71  Pulse 104  Temp(Src) 98.2 F (36.8 C) (Oral)  Resp 24  Ht 5\' 4"  (1.626 m)  Wt 77.8 kg (171 lb 8.3 oz)  BMI 29.43 kg/m2  SpO2 98%  Patient seen and examined, still bilateral wheezing  Assessment 1) COPD exacerbation; patient reports that he quit smoking 8 months ago - Continue scheduled albuterol nebs, he has nebulizer machine at home  - Will do Spiriva instead of Atrovent - Continue IV steroids today, transition to oral prednisone in a.m. - Placed on Claritin and Pulmicort (he is on Qvar at home), tussinex, cepacol - Made appt for pt with pulmonology, Dr Lake Bells on 12/08/13 at 2:45 pm. (added in AVS)  2) diabetes: Uncontrolled, placed on resistant sliding scale  Patient may be able to DC tomorrow if stable       Shatira Dobosz M.D. [facility] 12/01/2013, 10:07 AM  Pager: [phone]

## 2013-12-01 NOTE — ED Provider Notes (Signed)
CSN: [id number]     Arrival date & time 12/01/13  0017 History   First MD Initiated Contact with Patient 12/01/13 0018     Chief Complaint  Patient presents with  . Respiratory Distress     (Consider location/radiation/quality/duration/timing/severity/associated sxs/prior Treatment) Patient is a 63 y.o. male presenting with shortness of breath. The history is provided by the patient. No language interpreter was used.  Shortness of Breath Severity:  Moderate Onset quality:  Gradual Duration:  20 days Timing:  Constant Progression:  Worsening Chronicity:  Recurrent Relieved by:  Nothing Worsened by:  Movement Ineffective treatments:  Inhaler Associated symptoms: cough and PND   Associated symptoms: no abdominal pain, no chest pain, no fever, no headaches, no rash, no sputum production and no vomiting   Cough:    Cough characteristics:  Dry and hacking   Severity:  Severe   Duration:  20 days   Timing:  Constant   Progression:  Worsening   Chronicity:  New Risk factors: no hx of PE/DVT, no obesity, no oral contraceptive use, no recent surgery and no tobacco use     Past Medical History  Diagnosis Date  . Diabetes mellitus   . Hypertension   . Coronary artery disease   . Hepatitis C   . High cholesterol   . Arthritis   . Hepatitis   . COPD (chronic obstructive pulmonary disease)    Past Surgical History  Procedure Laterality Date  . Esophagogastroduodenoscopy  02/09/2011    Procedure: ESOPHAGOGASTRODUODENOSCOPY (EGD);  Surgeon: Missy Sabins, MD;  Location: [facility] ENDOSCOPY;  Service: Endoscopy;  Laterality: N/A;  . Joint replacement  10/22/2009    knee   Family History  Problem Relation Age of Onset  . Stroke Mother   . Diabetes Mother   . Hypertension Mother   . Hyperlipidemia Mother   . Heart disease Father   . Hypertension Father   . Heart attack Father    History  Substance Use Topics  . Smoking status: Former Smoker -- 0.25 packs/day for 30 years    Types:  Cigarettes    Quit date: 03/12/2013  . Smokeless tobacco: Not on file     Comment: pt states that he will be trying to quit 03/05/2012  . 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  HENT: Negative for congestion, facial swelling, rhinorrhea and trouble swallowing.   Eyes: Negative for photophobia and pain.  Respiratory: Positive for cough and shortness of breath. Negative for sputum production and chest tightness.   Cardiovascular: Positive for PND. Negative for chest pain and leg swelling.  Gastrointestinal: Negative for nausea, vomiting, abdominal pain, diarrhea and constipation.  Endocrine: Negative for polydipsia and polyuria.  Genitourinary: Negative for dysuria, urgency, decreased urine volume and difficulty urinating.  Musculoskeletal: Negative for back pain and gait problem.  Skin: Negative for color change, rash and wound.  Allergic/Immunologic: Negative for immunocompromised state.  Neurological: Negative for dizziness, facial asymmetry, speech difficulty, weakness, numbness and headaches.  Psychiatric/Behavioral: Negative for confusion, decreased concentration and agitation.      Allergies  Mobic  Home Medications   Prior to Admission medications   Medication Sig Start Date End Date Taking? Authorizing Provider  albuterol (PROVENTIL) (2.5 MG/3ML) 0.083% nebulizer solution Take 3 mLs (2.5 mg total) by nebulization every 6 (six) hours as needed for wheezing or shortness of breath. 10/12/13  Yes Lorayne Marek, MD  aspirin EC 81 MG tablet Take 1 tablet (81 mg total) by mouth daily. 08/31/13  Yes Lorayne Marek, MD  atorvastatin (LIPITOR) 10 MG tablet Take 10 mg by mouth daily at 6 PM.   Yes Historical Provider, MD  beclomethasone (QVAR) 80 MCG/ACT inhaler Inhale 2 puffs into the lungs 2 (two) times daily. 08/31/13  Yes Lorayne Marek, MD  cholecalciferol  (VITAMIN D) 1000 UNITS tablet Take 1 tablet (1,000 Units total) by mouth daily. 08/20/12  Yes Robbie Lis, MD  cilostazol (PLETAL) 100 MG tablet Take 100 mg by mouth 2 (two) times daily.   Yes Historical Provider, MD  gabapentin (NEURONTIN) 100 MG capsule Take 100 mg by mouth 3 (three) times daily.   Yes Historical Provider, MD  glipiZIDE (GLUCOTROL) 5 MG tablet Take 1 tablet (5 mg total) by mouth 2 (two) times daily before a meal. 08/31/13  Yes Deepak Advani, MD  guaiFENesin-codeine 100-10 MG/5ML syrup Take 10 mLs by mouth 3 (three) times daily as needed for cough. 08/18/13  Yes Tresa Garter, MD  hydrOXYzine (ATARAX/VISTARIL) 25 MG tablet Take 25 mg by mouth every 8 (eight) hours as needed for anxiety.   Yes Historical Provider, MD  lisinopril (PRINIVIL,ZESTRIL) 40 MG tablet Take 1 tablet (40 mg total) by mouth daily. 08/31/13  Yes Lorayne Marek, MD  metFORMIN (GLUCOPHAGE) 1000 MG tablet Take 1 tablet (1,000 mg total) by mouth 2 (two) times daily with a meal. 08/31/13  Yes Deepak Advani, MD  omeprazole (PRILOSEC) 40 MG capsule Take 1 capsule (40 mg total) by mouth daily. 08/31/13  Yes Lorayne Marek, MD  rosuvastatin (CRESTOR) 20 MG tablet Take 1 tablet (20 mg total) by mouth daily. 06/26/13  Yes Lorayne Marek, MD  predniSONE (DELTASONE) 20 MG tablet Take 3 tablets (60 mg total) by mouth daily. 11/12/13   Shari A Upstill, PA-C   BP 119/65  Pulse 104  Temp(Src) 97.6 F (36.4 C) (Oral)  Resp 19  Ht 5\' 4"  (1.626 m)  Wt 171 lb 8.3 oz (77.8 kg)  BMI 29.43 kg/m2  SpO2 95% Physical Exam  Constitutional: He is oriented to person, place, and time. He appears well-developed and well-nourished. No distress.  HENT:  Head: Normocephalic and atraumatic.  Mouth/Throat: No oropharyngeal exudate.  Eyes: Pupils are equal, round, and reactive to light.  Neck: Normal range of motion. Neck supple.  Cardiovascular: Regular rhythm and normal heart sounds.  Tachycardia present.  Exam reveals no gallop and no  friction rub.   No murmur heard. Pulmonary/Chest: Tachypnea noted. No respiratory distress. He has decreased breath sounds in the right upper field, the right middle field, the right lower field, the left upper field, the left middle field and the left lower field. He has wheezes in the right upper field, the right middle field, the right lower field, the left upper field, the left middle field and the left lower field. He has no rales.  Abdominal: Soft. Bowel sounds are normal. He exhibits no distension and no mass. There is no tenderness. There is no rebound and no guarding.  Musculoskeletal: Normal range of motion. He exhibits no edema and no tenderness.  Neurological: He is alert and oriented to person, place, and time.  Skin: Skin is warm and dry.  Psychiatric: He has a normal mood and affect.    ED Course  Procedures (including critical care time) Labs Review Labs Reviewed  CBC WITH DIFFERENTIAL - Abnormal; Notable for the following:  Neutrophils Relative % 25 (*)    Lymphocytes Relative 57 (*)    Eosinophils Relative 10 (*)    All other components within normal limits  BASIC METABOLIC PANEL - Abnormal; Notable for the following:    Glucose, Bld 120 (*)    Anion gap 19 (*)    All other components within normal limits  COMPREHENSIVE METABOLIC PANEL - Abnormal; Notable for the following:    Glucose, Bld 278 (*)    AST 73 (*)    ALT 111 (*)    Anion gap 21 (*)    All other components within normal limits  GLUCOSE, CAPILLARY - Abnormal; Notable for the following:    Glucose-Capillary 307 (*)    All other components within normal limits  HEMOGLOBIN A1C - Abnormal; Notable for the following:    Hemoglobin A1C 8.6 (*)    Mean Plasma Glucose 200 (*)    All other components within normal limits  GLUCOSE, CAPILLARY - Abnormal; Notable for the following:    Glucose-Capillary 351 (*)    All other components within normal limits  GLUCOSE, CAPILLARY - Abnormal; Notable for the  following:    Glucose-Capillary 254 (*)    All other components within normal limits  GLUCOSE, CAPILLARY - Abnormal; Notable for the following:    Glucose-Capillary 283 (*)    All other components within normal limits  PRO B NATRIURETIC PEPTIDE  TROPONIN I  LIPASE, BLOOD  TSH  CBC WITH DIFFERENTIAL  CBC  BASIC METABOLIC PANEL  CBC  I-STAT TROPOININ, ED    Imaging Review Dg Chest 2 View  12/01/2013   CLINICAL DATA:  Shortness of breath, cough  EXAM: CHEST  2 VIEW  COMPARISON:  11/11/2013  FINDINGS: Lungs are clear.  No pleural effusion or pneumothorax.  The heart is normal in size.  Visualized osseous structures are within normal limits.  IMPRESSION: No evidence of acute cardiopulmonary disease.   Electronically Signed   By: Julian Hy M.D.   On: 12/01/2013 02:28     EKG Interpretation   Date/Time:  Tuesday December 01 2013 00:20:49 EDT Ventricular Rate:  119 PR Interval:  143 QRS Duration: 80 QT Interval:  319 QTC Calculation: 449 R Axis:   76 Text Interpretation:  Sinus tachycardia Confirmed by OTTER  MD, OLGA  ([id number]) on 12/01/2013 12:43:22 AM      MDM   Final diagnoses:  Diabetes mellitus type 2, controlled  COPD exacerbation  Essential hypertension, benign  PVD (peripheral vascular disease)  Asthma with acute exacerbation, mild persistent    Pt is a 63 y.o. male with Pmhx as above who presents with resp distress with continued wheezing/dry cough since d/c on 9/9 for COPD exacerbation. Pt denies CP, fever, leg pain/swelling. He reports improving initially with steroids at home, but worsened when they were completed. On PE, Pt tachycardic, tachypneic, but can speak in full sentences. He has scattered wheezing and dec air mvmt throughout. No LE edema. Pt had 5mg  albuterol, 0.5mg  atrovent neb, and 125mg  IV solumedrol en route with mild improvement reported. Will do 10mg  cont neb with additional 0.5 atrovent. Suspect COPD exacerbation, but will also get CXR, BNP,  trop.  Will transfer care to Dr. Sharol Given.         [name], MD 12/01/13 [phone]

## 2013-12-01 NOTE — Progress Notes (Signed)
63yo male c/o SOB and cough x20d, to begin IV ABX for COPD exacerbation.  Will start Levaquin 500mg  IV Q24H and monitor CBC and Cx.  Wynona Neat, PharmD, BCPS 12/01/2013 4:44 AM

## 2013-12-01 NOTE — H&P (Signed)
[facility] History and Physical  NYCERE RENY UJW:[id number] DOB: 08/22/1950 DOA: 12/01/2013  Referring physician: ER physician. PCP: Doris Cheadle, MD   Chief Complaint: Shortness of breath.  HPI: [name] is a 63 y.o. male with history of COPD/asthma, diabetes mellitus, hyperlipidemia, hepatitis C, hypertension presents to the ER because of increasing shortness of breath over the last few days. Patient states 3 weeks ago he had COPD exacerbation and had come to the ER and was prescribed tapering dose of steroids when patient's symptoms improved. Patient states once patient's prednisone dose was over he started getting short of breath with cough and productive sputum. Denies any chest pain but has some epigastric discomfort on repeat coughing. Denies any nausea vomiting fever chills. In the ER patient was found to be wheezing and was placed on nebulizer and was given IV steroids prior to arrival by EMS. Chest x-ray does not show any infiltrates and patient is afebrile.   Review of Systems: As presented in the history of presenting illness, rest negative.  Past Medical History  Diagnosis Date  . Diabetes mellitus   . Hypertension   . Coronary artery disease   . Hepatitis C   . High cholesterol   . Arthritis   . Hepatitis   . COPD (chronic obstructive pulmonary disease)    Past Surgical History  Procedure Laterality Date  . Esophagogastroduodenoscopy  02/09/2011    Procedure: ESOPHAGOGASTRODUODENOSCOPY (EGD);  Surgeon: Barrie Folk, MD;  Location: [facility] ENDOSCOPY;  Service: Endoscopy;  Laterality: N/A;  . Joint replacement  10/22/2009    knee   Social History:  reports that he quit smoking about 8 months ago. His smoking use included Cigarettes. He has a 7.5 pack-year smoking history. He does not have any smokeless tobacco history on file. He reports that he does not drink alcohol or use illicit drugs. Where does patient live home. Can patient participate in ADLs?  Yes.  Allergies  Allergen Reactions  . Mobic [Meloxicam] Rash    Family History:  Family History  Problem Relation Age of Onset  . Stroke Mother   . Diabetes Mother   . Hypertension Mother   . Hyperlipidemia Mother   . Heart disease Father   . Hypertension Father   . Heart attack Father    Prior to Admission medications   Medication Sig Start Date End Date Taking? Authorizing Provider  albuterol (PROVENTIL) (2.5 MG/3ML) 0.083% nebulizer solution Take 3 mLs (2.5 mg total) by nebulization every 6 (six) hours as needed for wheezing or shortness of breath. 10/12/13  Yes Doris Cheadle, MD  aspirin EC 81 MG tablet Take 1 tablet (81 mg total) by mouth daily. 08/31/13  Yes Doris Cheadle, MD  atorvastatin (LIPITOR) 10 MG tablet Take 10 mg by mouth daily at 6 PM.   Yes Historical Provider, MD  beclomethasone (QVAR) 80 MCG/ACT inhaler Inhale 2 puffs into the lungs 2 (two) times daily. 08/31/13  Yes Doris Cheadle, MD  cholecalciferol (VITAMIN D) 1000 UNITS tablet Take 1 tablet (1,000 Units total) by mouth daily. 08/20/12  Yes Alison Murray, MD  cilostazol (PLETAL) 100 MG tablet Take 100 mg by mouth 2 (two) times daily.   Yes Historical Provider, MD  gabapentin (NEURONTIN) 100 MG capsule Take 100 mg by mouth 3 (three) times daily.   Yes Historical Provider, MD  glipiZIDE (GLUCOTROL) 5 MG tablet Take 1 tablet (5 mg total) by mouth 2 (two) times daily before a meal. 08/31/13  Yes Doris Cheadle, MD  guaiFENesin-codeine 100-10 MG/5ML syrup Take 10 mLs by mouth 3 (three) times daily as needed for cough. 08/18/13  Yes Quentin Angst, MD  hydrOXYzine (ATARAX/VISTARIL) 25 MG tablet Take 25 mg by mouth every 8 (eight) hours as needed for anxiety.   Yes Historical Provider, MD  lisinopril (PRINIVIL,ZESTRIL) 40 MG tablet Take 1 tablet (40 mg total) by mouth daily. 08/31/13  Yes Doris Cheadle, MD  metFORMIN (GLUCOPHAGE) 1000 MG tablet Take 1 tablet (1,000 mg total) by mouth 2 (two) times daily with a meal.  08/31/13  Yes Deepak Advani, MD  omeprazole (PRILOSEC) 40 MG capsule Take 1 capsule (40 mg total) by mouth daily. 08/31/13  Yes Doris Cheadle, MD  rosuvastatin (CRESTOR) 20 MG tablet Take 1 tablet (20 mg total) by mouth daily. 06/26/13  Yes Doris Cheadle, MD  predniSONE (DELTASONE) 20 MG tablet Take 3 tablets (60 mg total) by mouth daily. 11/12/13   Arnoldo Hooker, PA-C    Physical Exam: Filed Vitals:   12/01/13 0227 12/01/13 0246 12/01/13 0300 12/01/13 0330  BP:   109/67 125/73  Pulse:   106 105  Temp: 97.9 F (36.6 C)     TempSrc: Oral     Resp:   21 18  SpO2:  98% 97% 95%     General:   well-developed and moderately nourished.  Eyes:  anicteric no pallor.  ENT:  no discharge from ears eyes nose mouth.  Neck:  no mass felt. JVD not appreciated.  Cardiovascular:  S1-S2 heard.  Respiratory:  bilateral expiratory wheeze heard no crepitations.  Abdomen:  soft nontender bowel sounds present. No guarding rigidity.  Skin:  no rash.  Musculoskeletal:  no edema.  Psychiatric:  appears normal.  Neurologic:  alert awake oriented to time place and person. Moves all extremities.  Labs on Admission:  Basic Metabolic Panel:  Recent Labs Lab 12/01/13 0045  NA 139  K 3.8  CL 99  CO2 21  GLUCOSE 120*  BUN 16  CREATININE 0.73  CALCIUM 9.4   Liver Function Tests: No results found for this basename: AST, ALT, ALKPHOS, BILITOT, PROT, ALBUMIN,  in the last 168 hours No results found for this basename: LIPASE, AMYLASE,  in the last 168 hours No results found for this basename: AMMONIA,  in the last 168 hours CBC:  Recent Labs Lab 12/01/13 0045  WBC 6.8  NEUTROABS 1.7  HGB 14.0  HCT 42.1  MCV 84.7  PLT 230   Cardiac Enzymes: No results found for this basename: CKTOTAL, CKMB, CKMBINDEX, TROPONINI,  in the last 168 hours  BNP (last 3 results)  Recent Labs  11/11/13 1316 12/01/13 0035  PROBNP 39.1 70.7   CBG: No results found for this basename: GLUCAP,  in the  last 168 hours  Radiological Exams on Admission: Dg Chest 2 View  12/01/2013   CLINICAL DATA:  Shortness of breath, cough  EXAM: CHEST  2 VIEW  COMPARISON:  11/11/2013  FINDINGS: Lungs are clear.  No pleural effusion or pneumothorax.  The heart is normal in size.  Visualized osseous structures are within normal limits.  IMPRESSION: No evidence of acute cardiopulmonary disease.   Electronically Signed   By: Charline Bills M.D.   On: 12/01/2013 02:28    EKG: Independently reviewed. Sinus tachycardia.  Assessment/Plan Principal Problem:   COPD exacerbation Active Problems:   Essential hypertension, benign   Dyslipidemia   PVD (peripheral vascular disease)   Diabetes mellitus type 2, controlled   1. COPD exacerbation -  patient has been placed on nebulizer Pulmicort Levaquin and IV steroids. Closely follow her respiratory status. At this time patient is able to talk complete sentences without difficulty. 2. Diabetes mellitus type 2 - closely follow CBGs as patient is on steroids. Hold metformin while inpatient. Patient is on Glucotrol. 3. Hypertension - continue present medications. 4. Hyperlipidemia - on statins. 5. Peripheral vascular disease - continue cilostazol.    Code Status: Full code.  Family Communication: None.  Disposition Plan: Admit to inpatient.    Dyer Klug N. [facility] Pager [phone].  If 7PM-7AM, please contact night-coverage [email or web address] Password [email or web address] 12/01/2013, 4:07 AM

## 2013-12-02 DIAGNOSIS — J441 Chronic obstructive pulmonary disease with (acute) exacerbation: Secondary | ICD-10-CM | POA: Diagnosis not present

## 2013-12-02 LAB — GLUCOSE, CAPILLARY
GLUCOSE-CAPILLARY: 362 mg/dL — AB (ref 70–99)
GLUCOSE-CAPILLARY: 294 mg/dL — AB (ref 70–99)
Glucose-Capillary: 313 mg/dL — ABNORMAL HIGH (ref 70–99)
Glucose-Capillary: 151 mg/dL — ABNORMAL HIGH (ref 70–99)

## 2013-12-02 LAB — BASIC METABOLIC PANEL
Anion gap: 14 (ref 5–15)
BUN: 25 mg/dL — AB (ref 6–23)
CALCIUM: 8.8 mg/dL (ref 8.4–10.5)
CO2: 22 mEq/L (ref 19–32)
Chloride: 95 mEq/L — ABNORMAL LOW (ref 96–112)
Creatinine, Ser: 0.82 mg/dL (ref 0.50–1.35)
GFR calc Af Amer: >90 mL/min (ref >90)
Glucose, Bld: 384 mg/dL — ABNORMAL HIGH (ref 70–99)
Potassium: 4.9 mEq/L (ref 3.7–5.3)
Sodium: 131 mEq/L — ABNORMAL LOW (ref 137–147)

## 2013-12-02 LAB — CBC
HCT: 36 % — ABNORMAL LOW (ref 39.0–52.0)
HEMOGLOBIN: 12.3 g/dL — AB (ref 13.0–17.0)
MCH: 28 pg (ref 26.0–34.0)
MCHC: 34.2 g/dL (ref 30.0–36.0)
MCV: 82 fL (ref 78.0–100.0)
Platelets: 229 10*3/uL (ref 150–400)
RBC: 4.39 MIL/uL (ref 4.22–5.81)
RDW: 12.9 % (ref 11.5–15.5)
WBC: 6.6 10*3/uL (ref 4.0–10.5)

## 2013-12-02 MED ORDER — ENOXAPARIN SODIUM 40 MG/0.4ML ~~LOC~~ SOLN
40.0000 mg | SUBCUTANEOUS | Status: DC
  Administered 2013-12-02 – 2013-12-03 (×2): 40 mg via SUBCUTANEOUS
  Filled 2013-12-02 (×2): qty 0.4

## 2013-12-02 MED ORDER — INSULIN GLARGINE 100 UNIT/ML ~~LOC~~ SOLN
8.0000 [IU] | Freq: Every day | SUBCUTANEOUS | Status: DC
  Administered 2013-12-02 – 2013-12-03 (×2): 8 [IU] via SUBCUTANEOUS
  Filled 2013-12-02 (×3): qty 0.08

## 2013-12-02 MED ORDER — LEVOFLOXACIN 500 MG PO TABS
500.0000 mg | ORAL_TABLET | Freq: Every day | ORAL | Status: DC
  Administered 2013-12-03 – 2013-12-04 (×2): 500 mg via ORAL
  Filled 2013-12-02 (×2): qty 1

## 2013-12-02 NOTE — Progress Notes (Signed)
Inpatient Diabetes Program Recommendations  AACE/ADA: New Consensus Statement on Inpatient Glycemic Control (2013)  Target Ranges:  Prepandial:   less than 140 mg/dL      Peak postprandial:   less than 180 mg/dL (1-2 hours)      Critically ill patients:  140 - 180 mg/dL   Results for EVERT, WENRICH (MRN [id number]) as of 12/02/2013 10:51  Ref. Range 12/01/2013 07:41 12/01/2013 11:50 12/01/2013 17:44 12/01/2013 22:15 12/02/2013 07:12  Glucose-Capillary Latest Range: 70-99 mg/dL 307 (H) 351 (H) 254 (H) 283 (H) 362 (H)   Diabetes history: DM2 Outpatient Diabetes medications: Glipizide 5 mg BID, Metformin 1000 mg BID Current orders for Inpatient glycemic control: Novolog 0-20 units AC, Novolog 0-5 units HS, Glipizide 5 mg BID  Inpatient Diabetes Program Recommendations Insulin - Basal: If steroids are continued, please consider ordering Levemir 8 units daily starting now (based on 77.8 kg x 0.1 units). Insulin - Meal Coverage: If steroids are continued, please consider ordering Novolog 5 units TID with meals for meal coverage.  Thanks, Barnie Alderman, RN, MSN, CCRN Diabetes Coordinator Inpatient Diabetes Program [phone] (Team Pager) [phone] ([place] office) [phone] [facility] office)

## 2013-12-02 NOTE — Progress Notes (Signed)
[facility] PROGRESS NOTE  [name] VHQ:[id number] DOB: [birth year] DOA: 12/01/2013 PCP: Lorayne Marek, MD  Assessment/Plan: 63 y.o. male with history of COPD/asthma, diabetes mellitus, hyperlipidemia, hepatitis C, hypertension, pad presents to the ER because of increasing shortness of breath over the last few days. Patient states 3 weeks ago he had COPD exacerbation and had come to the ER and was prescribed tapering dose of steroids when patient's symptoms improved. Patient states once patient's prednisone dose was over he started getting short of breath with cough and productive sputum.  1. COPD exacerbation; not responding well to outpatient treatment;' patient reports that he quit smoking 8 months ago; - cont IV steroids, antx, bronchodilators;  -f/u appt for pt with pulmonology, Dr Lake Bells on 12/08/13 at 2:45 pm. (added in AVS)  2. DM, Uncontrolled, placed on resistant sliding scale 3. HTN, cont home regimen  4. PAD, cont outpatient follow up    Code Status: full Family Communication:  D/w patient,  (indicate person spoken with, relationship, and if by phone, the number) Disposition Plan: home 24-48 hrs    Consultants:  none  Procedures:  none  Antibiotics:  levofloxa 9/29<<, (indicate start date, and stop date if known)  HPI/Subjective: alert  Objective: Filed Vitals:   12/02/13 0513  BP: 101/67  Pulse: 98  Temp: 97.6 F (36.4 C)  Resp: 18    Intake/Output Summary (Last 24 hours) at 12/02/13 1142 Last data filed at 12/02/13 0949  Gross per 24 hour  Intake    760 ml  Output      4 ml  Net    756 ml   Filed Weights   12/01/13 0434  Weight: 77.8 kg (171 lb 8.3 oz)    Exam:   General:  alert  Cardiovascular: s1,s2 rrr  Respiratory: poor ventilation   Abdomen: soft, nt,nd  Musculoskeletal: no LE edema    Data Reviewed: Basic Metabolic Panel:  Recent Labs Lab 12/01/13 0045 12/01/13 0522 12/02/13 0425  NA 139 138 131*  K 3.8 4.6  4.9  CL 99 96 95*  CO2 21 21 22   GLUCOSE 120* 278* 384*  BUN 16 18 25*  CREATININE 0.73 0.68 0.82  CALCIUM 9.4 9.4 8.8   Liver Function Tests:  Recent Labs Lab 12/01/13 0522  AST 73*  ALT 111*  ALKPHOS 58  BILITOT 0.4  PROT 7.4  ALBUMIN 3.9    Recent Labs Lab 12/01/13 0522  LIPASE 20   No results found for this basename: AMMONIA,  in the last 168 hours CBC:  Recent Labs Lab 12/01/13 0045 12/02/13 0425  WBC 6.8 6.6  NEUTROABS 1.7  --   HGB 14.0 12.3*  HCT 42.1 36.0*  MCV 84.7 82.0  PLT 230 229   Cardiac Enzymes:  Recent Labs Lab 12/01/13 0528  TROPONINI <0.30   BNP (last 3 results)  Recent Labs  11/11/13 1316 12/01/13 0035  PROBNP 39.1 70.7   CBG:  Recent Labs Lab 12/01/13 1150 12/01/13 1744 12/01/13 2215 12/02/13 0712 12/02/13 1127  GLUCAP 351* 254* 283* 362* 313*    No results found for this or any previous visit (from the past 240 hour(s)).   Studies: Dg Chest 2 View  12/01/2013   CLINICAL DATA:  Shortness of breath, cough  EXAM: CHEST  2 VIEW  COMPARISON:  11/11/2013  FINDINGS: Lungs are clear.  No pleural effusion or pneumothorax.  The heart is normal in size.  Visualized osseous structures are within normal limits.  IMPRESSION: No evidence  of acute cardiopulmonary disease.   Electronically Signed   By: Julian Hy M.D.   On: 12/01/2013 02:28    Scheduled Meds: . albuterol  2.5 mg Nebulization BID  . aspirin EC  81 mg Oral Daily  . atorvastatin  10 mg Oral q1800  . benzonatate  100 mg Oral TID  . budesonide (PULMICORT) nebulizer solution  0.25 mg Nebulization BID  . chlorpheniramine-HYDROcodone  5 mL Oral Q12H  . cholecalciferol  1,000 Units Oral Daily  . cilostazol  100 mg Oral BID  . enoxaparin (LOVENOX) injection  40 mg Subcutaneous Q24H  . gabapentin  100 mg Oral TID  . glipiZIDE  5 mg Oral BID AC  . insulin aspart  0-20 Units Subcutaneous TID WC  . insulin aspart  0-5 Units Subcutaneous QHS  . [START ON 12/03/2013]  levofloxacin  500 mg Oral Daily  . lisinopril  40 mg Oral Daily  . loratadine  10 mg Oral Daily  . methylPREDNISolone (SOLU-MEDROL) injection  40 mg Intravenous 3 times per day  . pantoprazole  40 mg Oral BID AC  . sodium chloride  3 mL Intravenous Q12H  . tiotropium  18 mcg Inhalation Daily  . zolpidem  5 mg Oral QHS   Continuous Infusions:   Principal Problem:   COPD exacerbation Active Problems:   Essential hypertension, benign   Dyslipidemia   PVD (peripheral vascular disease)   Diabetes mellitus type 2, controlled    Time spent: >35 minutes     Kinnie Feil  [facility] Pager [phone]. If 7PM-7AM, please contact night-coverage at [email or web address], password [email or web address] 12/02/2013, 11:42 AM  LOS: 1 day

## 2013-12-03 DIAGNOSIS — I10 Essential (primary) hypertension: Secondary | ICD-10-CM

## 2013-12-03 DIAGNOSIS — E119 Type 2 diabetes mellitus without complications: Secondary | ICD-10-CM

## 2013-12-03 DIAGNOSIS — J441 Chronic obstructive pulmonary disease with (acute) exacerbation: Principal | ICD-10-CM

## 2013-12-03 LAB — BASIC METABOLIC PANEL
Anion gap: 13 (ref 5–15)
BUN: 25 mg/dL — ABNORMAL HIGH (ref 6–23)
CO2: 23 mEq/L (ref 19–32)
Calcium: 8.6 mg/dL (ref 8.4–10.5)
Chloride: 98 mEq/L (ref 96–112)
Creatinine, Ser: 0.72 mg/dL (ref 0.50–1.35)
Glucose, Bld: 332 mg/dL — ABNORMAL HIGH (ref 70–99)
POTASSIUM: 5 meq/L (ref 3.7–5.3)
Sodium: 134 mEq/L — ABNORMAL LOW (ref 137–147)

## 2013-12-03 LAB — GLUCOSE, CAPILLARY
GLUCOSE-CAPILLARY: 309 mg/dL — AB (ref 70–99)
GLUCOSE-CAPILLARY: 306 mg/dL — AB (ref 70–99)
Glucose-Capillary: 308 mg/dL — ABNORMAL HIGH (ref 70–99)
Glucose-Capillary: 163 mg/dL — ABNORMAL HIGH (ref 70–99)

## 2013-12-03 LAB — MRSA PCR SCREENING: MRSA by PCR: NEGATIVE

## 2013-12-03 MED ORDER — METHYLPREDNISOLONE SODIUM SUCC 40 MG IJ SOLR
40.0000 mg | Freq: Two times a day (BID) | INTRAMUSCULAR | Status: DC
  Administered 2013-12-03 – 2013-12-04 (×2): 40 mg via INTRAVENOUS
  Filled 2013-12-03 (×3): qty 1

## 2013-12-03 NOTE — Progress Notes (Signed)
[facility] PROGRESS NOTE  [name] DZH:[id number] DOB: 11-08-1950 DOA: 12/01/2013 PCP: Lorayne Marek, MD  Assessment/Plan: 63 y.o. male with history of COPD/asthma, diabetes mellitus, hyperlipidemia, hepatitis C, hypertension, pad presents to the ER because of increasing shortness of breath over the last few days. Patient states 3 weeks ago he had COPD exacerbation and had come to the ER and was prescribed tapering dose of steroids when patient's symptoms improved. Patient states once patient's prednisone dose was over he started getting short of breath with cough and productive sputum.  1. COPD exacerbation; not responding well to outpatient treatment;' patient reports that he quit smoking 8 months ago; -improving on IV steroids, antx, bronchodilators;  -f/u appt for pt with pulmonology, Dr Lake Bells on 12/08/13 at 2:45 pm. (added in AVS)  2. DM, Uncontrolled, placed on resistant sliding scale; d/w patient he does not want insulin at this time upon d/c  3. HTN, cont home regimen  4. PAD, cont outpatient follow up   D/c home on 10/2 if stable   Code Status: full Family Communication:  D/w patient,  (indicate person spoken with, relationship, and if by phone, the number) Disposition Plan: home 24-48 hrs    Consultants:  none  Procedures:  none  Antibiotics:  levofloxa 9/29<<, (indicate start date, and stop date if known)  HPI/Subjective: alert  Objective: Filed Vitals:   12/03/13 0519  BP: 120/77  Pulse: 94  Temp: 97.8 F (36.6 C)  Resp: 18    Intake/Output Summary (Last 24 hours) at 12/03/13 1017 Last data filed at 12/03/13 0900  Gross per 24 hour  Intake    820 ml  Output    358 ml  Net    462 ml   Filed Weights   12/01/13 0434  Weight: 77.8 kg (171 lb 8.3 oz)    Exam:   General:  alert  Cardiovascular: s1,s2 rrr  Respiratory: poor ventilation   Abdomen: soft, nt,nd  Musculoskeletal: no LE edema    Data Reviewed: Basic Metabolic  Panel:  Recent Labs Lab 12/01/13 0045 12/01/13 0522 12/02/13 0425 12/03/13 0540  NA 139 138 131* 134*  K 3.8 4.6 4.9 5.0  CL 99 96 95* 98  CO2 21 21 22 23   GLUCOSE 120* 278* 384* 332*  BUN 16 18 25* 25*  CREATININE 0.73 0.68 0.82 0.72  CALCIUM 9.4 9.4 8.8 8.6   Liver Function Tests:  Recent Labs Lab 12/01/13 0522  AST 73*  ALT 111*  ALKPHOS 58  BILITOT 0.4  PROT 7.4  ALBUMIN 3.9    Recent Labs Lab 12/01/13 0522  LIPASE 20   No results found for this basename: AMMONIA,  in the last 168 hours CBC:  Recent Labs Lab 12/01/13 0045 12/02/13 0425  WBC 6.8 6.6  NEUTROABS 1.7  --   HGB 14.0 12.3*  HCT 42.1 36.0*  MCV 84.7 82.0  PLT 230 229   Cardiac Enzymes:  Recent Labs Lab 12/01/13 0528  TROPONINI <0.30   BNP (last 3 results)  Recent Labs  11/11/13 1316 12/01/13 0035  PROBNP 39.1 70.7   CBG:  Recent Labs Lab 12/02/13 0712 12/02/13 1127 12/02/13 1715 12/02/13 2208 12/03/13 0834  GLUCAP 362* 313* 151* 294* 308*    No results found for this or any previous visit (from the past 240 hour(s)).   Studies: No results found.  Scheduled Meds: . albuterol  2.5 mg Nebulization BID  . aspirin EC  81 mg Oral Daily  . atorvastatin  10  mg Oral q1800  . benzonatate  100 mg Oral TID  . budesonide (PULMICORT) nebulizer solution  0.25 mg Nebulization BID  . chlorpheniramine-HYDROcodone  5 mL Oral Q12H  . cholecalciferol  1,000 Units Oral Daily  . cilostazol  100 mg Oral BID  . enoxaparin (LOVENOX) injection  40 mg Subcutaneous Q24H  . gabapentin  100 mg Oral TID  . insulin aspart  0-20 Units Subcutaneous TID WC  . insulin aspart  0-5 Units Subcutaneous QHS  . insulin glargine  8 Units Subcutaneous QHS  . levofloxacin  500 mg Oral Daily  . lisinopril  40 mg Oral Daily  . loratadine  10 mg Oral Daily  . methylPREDNISolone (SOLU-MEDROL) injection  40 mg Intravenous 3 times per day  . pantoprazole  40 mg Oral BID AC  . sodium chloride  3 mL  Intravenous Q12H  . tiotropium  18 mcg Inhalation Daily  . zolpidem  5 mg Oral QHS   Continuous Infusions:   Principal Problem:   COPD exacerbation Active Problems:   Essential hypertension, benign   Dyslipidemia   PVD (peripheral vascular disease)   Diabetes mellitus type 2, controlled    Time spent: >35 minutes     Kinnie Feil  [facility] Pager [phone]. If 7PM-7AM, please contact night-coverage at [email or web address], password [facility] 12/03/2013, 10:17 AM  LOS: 2 days

## 2013-12-03 NOTE — Progress Notes (Signed)
Inpatient Diabetes Program Recommendations  AACE/ADA: New Consensus Statement on Inpatient Glycemic Control (2013)  Target Ranges:  Prepandial:   less than 140 mg/dL      Peak postprandial:   less than 180 mg/dL (1-2 hours)      Critically ill patients:  140 - 180 mg/dL  Results for [name], [name] (MRN [id number]) as of 12/03/2013 11:41  Ref. Range 12/02/2013 07:12 12/02/2013 11:27 12/02/2013 17:15 12/02/2013 22:08 12/03/2013 08:34  Glucose-Capillary Latest Range: 70-99 mg/dL 362 (H) 313 (H) 151 (H) 294 (H) 308 (H)   Consider increasing Lantus to 15 units during steroid therapy. May also need to add Novolog 4 units TID with meals per Glycemic Control order-set. Thank you  Raoul Pitch BSN, RN,CDE Inpatient Diabetes Coordinator [phone] (team pager)

## 2013-12-04 DIAGNOSIS — E089 Diabetes mellitus due to underlying condition without complications: Secondary | ICD-10-CM

## 2013-12-04 LAB — GLUCOSE, CAPILLARY
GLUCOSE-CAPILLARY: 254 mg/dL — AB (ref 70–99)
Glucose-Capillary: 354 mg/dL — ABNORMAL HIGH (ref 70–99)

## 2013-12-04 MED ORDER — PREDNISONE 20 MG PO TABS: 20.0000 mg | ORAL_TABLET | Freq: Every day | ORAL | Status: DC

## 2013-12-04 MED ORDER — METFORMIN HCL 1000 MG PO TABS: 1000.0000 mg | ORAL_TABLET | Freq: Two times a day (BID) | ORAL | Status: DC

## 2013-12-04 MED ORDER — ALBUTEROL SULFATE (2.5 MG/3ML) 0.083% IN NEBU: 2.5000 mg | INHALATION_SOLUTION | Freq: Four times a day (QID) | RESPIRATORY_TRACT | Status: DC | PRN

## 2013-12-04 MED ORDER — BECLOMETHASONE DIPROPIONATE 80 MCG/ACT IN AERS: 2.0000 | INHALATION_SPRAY | Freq: Two times a day (BID) | RESPIRATORY_TRACT | Status: DC

## 2013-12-04 MED ORDER — GLIPIZIDE 5 MG PO TABS: 5.0000 mg | ORAL_TABLET | Freq: Two times a day (BID) | ORAL | Status: DC

## 2013-12-04 MED ORDER — LEVOFLOXACIN 500 MG PO TABS: 500.0000 mg | ORAL_TABLET | Freq: Every day | ORAL | Status: DC

## 2013-12-04 MED ORDER — GABAPENTIN 100 MG PO CAPS: 100.0000 mg | ORAL_CAPSULE | Freq: Three times a day (TID) | ORAL | Status: DC

## 2013-12-04 MED ORDER — ZOLPIDEM TARTRATE 5 MG PO TABS: 5.0000 mg | ORAL_TABLET | Freq: Every day | ORAL | Status: DC

## 2013-12-04 MED ORDER — BENZONATATE 100 MG PO CAPS: 100.0000 mg | ORAL_CAPSULE | Freq: Three times a day (TID) | ORAL | Status: DC

## 2013-12-04 MED ORDER — TIOTROPIUM BROMIDE MONOHYDRATE 18 MCG IN CAPS: 18.0000 ug | ORAL_CAPSULE | Freq: Every day | RESPIRATORY_TRACT | Status: DC

## 2013-12-04 NOTE — Discharge Summary (Signed)
Physician Discharge Summary  [name]:[id number] DOB: 06/11/1950 DOA: 12/01/2013  PCP: Doris Cheadle, MD  Admit date: 12/01/2013 Discharge date: 12/04/2013  Time spent: >35 minutes  Recommendations for Outpatient Follow-up:  F/u with pulmonology as scheduled  Discharge Diagnoses:  Principal Problem:   COPD exacerbation Active Problems:   Essential hypertension, benign   Dyslipidemia   PVD (peripheral vascular disease)   Diabetes mellitus type 2, controlled   Discharge Condition: stable   Diet recommendation: low sodium, DM   Filed Weights   12/01/13 0434  Weight: 77.8 kg (171 lb 8.3 oz)    History of present illness:  63 y.o. male with history of COPD/asthma, diabetes mellitus, hyperlipidemia, hepatitis C, hypertension, pad presents to the ER because of increasing shortness of breath over the last few days. Patient states 3 weeks ago he had COPD exacerbation and had come to the ER and was prescribed tapering dose of steroids when patient's symptoms improved. Patient states once patient's prednisone dose was over he started getting short of breath with cough and productive sputum.   Hospital Course:  1. COPD exacerbation; not responding well to outpatient treatment;' patient reports that he quit smoking 8 months ago;  -improved on IV steroids, antx, bronchodilators; taper steroids;  -f/u appt for pt with pulmonology, Dr Kendrick Fries on 12/08/13 at 2:45 pm. 2. DM, Uncontrolled, placed on resistant sliding scale; d/w patient he does not want insulin at this time upon d/c; recommended to increased gli[pizie to 10 bid while on stereoids;  3. HTN, cont home regimen  4. PAD, cont outpatient follow up    Procedures:  none (i.e. Studies not automatically included, echos, thoracentesis, etc; not x-rays)  Consultations:  none  Discharge Exam: Filed Vitals:   12/04/13 0521  BP: 121/81  Pulse: 87  Temp: 97.9 F (36.6 C)  Resp: 15    General: alert Cardiovascular: s1,s2  rrr Respiratory: CTA BL  Discharge Instructions  Discharge Instructions   Diet - low sodium heart healthy    Complete by:  As directed      Discharge instructions    Complete by:  As directed   Please follow up with pulmonology as scheduled     Increase activity slowly    Complete by:  As directed             Medication List         albuterol (2.5 MG/3ML) 0.083% nebulizer solution  Commonly known as:  PROVENTIL  Take 3 mLs (2.5 mg total) by nebulization every 6 (six) hours as needed for wheezing or shortness of breath.     aspirin EC 81 MG tablet  Take 1 tablet (81 mg total) by mouth daily.     atorvastatin 10 MG tablet  Commonly known as:  LIPITOR  Take 10 mg by mouth daily at 6 PM.     beclomethasone 80 MCG/ACT inhaler  Commonly known as:  QVAR  Inhale 2 puffs into the lungs 2 (two) times daily.     benzonatate 100 MG capsule  Commonly known as:  TESSALON  Take 1 capsule (100 mg total) by mouth 3 (three) times daily.     cholecalciferol 1000 UNITS tablet  Commonly known as:  VITAMIN D  Take 1 tablet (1,000 Units total) by mouth daily.     cilostazol 100 MG tablet  Commonly known as:  PLETAL  Take 100 mg by mouth 2 (two) times daily.     gabapentin 100 MG capsule  Commonly known as:  NEURONTIN  Take 1 capsule (100 mg total) by mouth 3 (three) times daily.     glipiZIDE 5 MG tablet  Commonly known as:  GLUCOTROL  Take 1 tablet (5 mg total) by mouth 2 (two) times daily before a meal.     guaiFENesin-codeine 100-10 MG/5ML syrup  Take 10 mLs by mouth 3 (three) times daily as needed for cough.     hydrOXYzine 25 MG tablet  Commonly known as:  ATARAX/VISTARIL  Take 25 mg by mouth every 8 (eight) hours as needed for anxiety.     levofloxacin 500 MG tablet  Commonly known as:  LEVAQUIN  Take 1 tablet (500 mg total) by mouth daily.     lisinopril 40 MG tablet  Commonly known as:  PRINIVIL,ZESTRIL  Take 1 tablet (40 mg total) by mouth daily.     metFORMIN  1000 MG tablet  Commonly known as:  GLUCOPHAGE  Take 1 tablet (1,000 mg total) by mouth 2 (two) times daily with a meal.     omeprazole 40 MG capsule  Commonly known as:  PRILOSEC  Take 1 capsule (40 mg total) by mouth daily.     predniSONE 20 MG tablet  Commonly known as:  DELTASONE  Take 1 tablet (20 mg total) by mouth daily.     rosuvastatin 20 MG tablet  Commonly known as:  CRESTOR  Take 1 tablet (20 mg total) by mouth daily.     tiotropium 18 MCG inhalation capsule  Commonly known as:  SPIRIVA  Place 1 capsule (18 mcg total) into inhaler and inhale daily.     zolpidem 5 MG tablet  Commonly known as:  AMBIEN  Take 1 tablet (5 mg total) by mouth at bedtime.       Allergies  Allergen Reactions  . Mobic [Meloxicam] Rash       Follow-up Information   Follow up with Max Fickle, MD On 12/08/2013. (at 2:45PM, please arrive 10-15 mins for paperwork. , for hospital follow-up)    Specialty:  Pulmonary Disease   Contact information:   [address] [place] [address] [phone]        The results of significant diagnostics from this hospitalization (including imaging, microbiology, ancillary and laboratory) are listed below for reference.    Significant Diagnostic Studies: Dg Chest 2 View  12/01/2013   CLINICAL DATA:  Shortness of breath, cough  EXAM: CHEST  2 VIEW  COMPARISON:  11/11/2013  FINDINGS: Lungs are clear.  No pleural effusion or pneumothorax.  The heart is normal in size.  Visualized osseous structures are within normal limits.  IMPRESSION: No evidence of acute cardiopulmonary disease.   Electronically Signed   By: Charline Bills M.D.   On: 12/01/2013 02:28   Dg Chest 2 View  11/11/2013   CLINICAL DATA:  Chest pain and difficulty breathing  EXAM: CHEST  2 VIEW  COMPARISON:  August 18, 2013  FINDINGS: There is no edema or consolidation. The heart size and pulmonary vascularity are normal. No adenopathy. No bone lesions.  IMPRESSION: No edema or  consolidation.   Electronically Signed   By: Bretta Bang M.D.   On: 11/11/2013 13:55    Microbiology: Recent Results (from the past 240 hour(s))  MRSA PCR SCREENING     Status: None   Collection Time    12/03/13 12:22 PM      Result Value Ref Range Status   MRSA by PCR NEGATIVE  NEGATIVE Final   Comment:  The GeneXpert MRSA Assay (FDA     approved for NASAL specimens     only), is one component of a     comprehensive MRSA colonization     surveillance program. It is not     intended to diagnose MRSA     infection nor to guide or     monitor treatment for     MRSA infections.     Labs: Basic Metabolic Panel:  Recent Labs Lab 12/01/13 0045 12/01/13 0522 12/02/13 0425 12/03/13 0540  NA 139 138 131* 134*  K 3.8 4.6 4.9 5.0  CL 99 96 95* 98  CO2 21 21 22 23   GLUCOSE 120* 278* 384* 332*  BUN 16 18 25* 25*  CREATININE 0.73 0.68 0.82 0.72  CALCIUM 9.4 9.4 8.8 8.6   Liver Function Tests:  Recent Labs Lab 12/01/13 0522  AST 73*  ALT 111*  ALKPHOS 58  BILITOT 0.4  PROT 7.4  ALBUMIN 3.9    Recent Labs Lab 12/01/13 0522  LIPASE 20   No results found for this basename: AMMONIA,  in the last 168 hours CBC:  Recent Labs Lab 12/01/13 0045 12/02/13 0425  WBC 6.8 6.6  NEUTROABS 1.7  --   HGB 14.0 12.3*  HCT 42.1 36.0*  MCV 84.7 82.0  PLT 230 229   Cardiac Enzymes:  Recent Labs Lab 12/01/13 0528  TROPONINI <0.30   BNP: BNP (last 3 results)  Recent Labs  11/11/13 1316 12/01/13 0035  PROBNP 39.1 70.7   CBG:  Recent Labs Lab 12/03/13 1159 12/03/13 1644 12/03/13 2125 12/04/13 0736 12/04/13 1148  GLUCAP 309* 306* 163* 254* 354*       Signed:  Delane Wessinger N  [facility] 12/04/2013, 11:58 AM

## 2013-12-04 NOTE — Care Management Note (Signed)
  Page 1 of 1   12/04/2013     11:06:32 AM CARE MANAGEMENT NOTE 12/04/2013  Patient:  [name], [name]   Account Number:  [id number]  Date Initiated:  12/04/2013  Documentation initiated by:  Magdalen Spatz  Subjective/Objective Assessment:     Action/Plan:   Anticipated DC Date:  12/04/2013   Anticipated DC Plan:  HOME/SELF CARE         Choice offered to / List presented to:             Status of service:   Medicare Important Message given?  YES (If response is "NO", the following Medicare IM given date fields will be blank) Date Medicare IM given:  12/04/2013 Medicare IM given by:  Magdalen Spatz Date Additional Medicare IM given:   Additional Medicare IM given by:    Discharge Disposition:    Per UR Regulation:    If discussed at Long Length of Stay Meetings, dates discussed:    Comments:

## 2013-12-07 ENCOUNTER — Other Ambulatory Visit: Payer: Self-pay | Admitting: Internal Medicine

## 2013-12-08 ENCOUNTER — Ambulatory Visit (INDEPENDENT_AMBULATORY_CARE_PROVIDER_SITE_OTHER): Payer: Commercial Managed Care - HMO | Admitting: Pulmonary Disease

## 2013-12-08 ENCOUNTER — Encounter: Payer: Self-pay | Admitting: Pulmonary Disease

## 2013-12-08 VITALS — BP 128/78 | HR 94 | Ht 64.5 in | Wt 172.0 lb

## 2013-12-08 DIAGNOSIS — R0600 Dyspnea, unspecified: Secondary | ICD-10-CM

## 2013-12-08 DIAGNOSIS — J4531 Mild persistent asthma with (acute) exacerbation: Secondary | ICD-10-CM

## 2013-12-08 DIAGNOSIS — J441 Chronic obstructive pulmonary disease with (acute) exacerbation: Secondary | ICD-10-CM

## 2013-12-08 DIAGNOSIS — R0602 Shortness of breath: Secondary | ICD-10-CM

## 2013-12-08 NOTE — Progress Notes (Signed)
Subjective:    Patient ID: [name], male    DOB: 01-26-1951, 63 y.o.   MRN: [id number]  HPI  [name] is here to see me because he has been hospitalized a few times recently for presumed COPD exacerbations.  He has been in the ED and briefly hospitalized in September 2015 alone so he was sent to see me.  He notes that the started having more dyspnea on 9/29 associated with a severe cough which made him "nearly collapse".  He was admitted for further evaluation. There he received steroids and bronchodilators and was discharged home after 3 days.  This occurred after he had been seen in the ED and sent home on prednisone.    Since his hospital stay he is feeling better, but is not completely back to normal.  He is no longer coughing.  Yesterday he walked 4 miles at a steady pace and did OK.    He continues to take prednisone now.  He completed the antibiotic today.  He had several episodes of coughing and wheezing this year since he quit smoking earlier this year.  He noticed gradual worsening in his dyspnea which was worse when he was smoking.  He quit in January in 2015.  He tries to walk for exercise regularly but he his limited by leg claudication.  This year he was getting more dyspnic.    He has been taking Spiriva and QVar since he left the hospital. He thinks that they help.    His childhood was normal without respiratory problems and has no family members with lung disease.  He smoked 1.5 ppd from age 65 until age 52.   He carries a diagnosis of coronary artery disease but ot his knowledge he has never had a heart attack.  He has leg claudication.  He is on disability after having a knee replacement.    Past Medical History  Diagnosis Date  . Diabetes mellitus   . Hypertension   . Coronary artery disease   . Hepatitis C   . High cholesterol   . Arthritis   . Hepatitis   . COPD (chronic obstructive pulmonary disease)      Family History  Problem Relation Age of Onset  .  Stroke Mother   . Diabetes Mother   . Hypertension Mother   . Hyperlipidemia Mother   . Heart disease Father   . Hypertension Father   . Heart attack Father      History   Social History  . Marital Status: Single    Spouse Name: N/A    Number of Children: N/A  . Years of Education: N/A   Occupational History  . Not on file.   Social History Main Topics  . Smoking status: Former Smoker -- 1.50 packs/day for 30 years    Types: Cigarettes    Quit date: 03/19/2013  . Smokeless tobacco: Never Used  . 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 Drug Use: No  . Sexual Activity: Not on file   Other Topics Concern  . Not on file   Social History Narrative  . No narrative on file     Allergies  Allergen Reactions  . Mobic [Meloxicam] Rash     Outpatient Prescriptions Prior to Visit  Medication Sig Dispense Refill  . albuterol (PROVENTIL) (2.5 MG/3ML) 0.083% nebulizer solution Take 3 mLs (2.5 mg total) by nebulization every 6 (six) hours as needed for  wheezing or shortness of breath.  75 mL  11  . aspirin EC 81 MG tablet Take 1 tablet (81 mg total) by mouth daily.  30 tablet  3  . atorvastatin (LIPITOR) 10 MG tablet Take 10 mg by mouth daily at 6 PM.      . beclomethasone (QVAR) 80 MCG/ACT inhaler Inhale 2 puffs into the lungs 2 (two) times daily.  1 Inhaler  1  . benzonatate (TESSALON) 100 MG capsule Take 1 capsule (100 mg total) by mouth 3 (three) times daily.  20 capsule  0  . cholecalciferol (VITAMIN D) 1000 UNITS tablet Take 1 tablet (1,000 Units total) by mouth daily.  30 tablet  0  . cilostazol (PLETAL) 100 MG tablet Take 100 mg by mouth 2 (two) times daily.      [name] gabapentin (NEURONTIN) 100 MG capsule Take 1 capsule (100 mg total) by mouth 3 (three) times daily.  90 capsule  1  . glipiZIDE (GLUCOTROL) 5 MG tablet Take 1 tablet (5 mg total) by mouth 2 (two) times daily before a meal.  60 tablet  3  . guaiFENesin-codeine 100-10 MG/5ML syrup Take  10 mLs by mouth 3 (three) times daily as needed for cough.  480 mL  0  . hydrOXYzine (ATARAX/VISTARIL) 25 MG tablet Take 25 mg by mouth every 8 (eight) hours as needed for anxiety.      [name] lisinopril (PRINIVIL,ZESTRIL) 40 MG tablet Take 1 tablet (40 mg total) by mouth daily.  30 tablet  3  . metFORMIN (GLUCOPHAGE) 1000 MG tablet Take 1 tablet (1,000 mg total) by mouth 2 (two) times daily with a meal.  180 tablet  3  . omeprazole (PRILOSEC) 40 MG capsule Take 1 capsule (40 mg total) by mouth daily.  30 capsule  3  . predniSONE (DELTASONE) 20 MG tablet Take 1 tablet (20 mg total) by mouth daily.  21 tablet  0  . rosuvastatin (CRESTOR) 20 MG tablet Take 1 tablet (20 mg total) by mouth daily.  90 tablet  3  . tiotropium (SPIRIVA) 18 MCG inhalation capsule Place 1 capsule (18 mcg total) into inhaler and inhale daily.  30 capsule  12  . zolpidem (AMBIEN) 5 MG tablet Take 1 tablet (5 mg total) by mouth at bedtime.  15 tablet  0  . levofloxacin (LEVAQUIN) 500 MG tablet Take 1 tablet (500 mg total) by mouth daily.  4 tablet  0   No facility-administered medications prior to visit.      Review of Systems  Constitutional: Negative for fever, chills, activity change and appetite change.  HENT: Negative for congestion, ear pain, hearing loss, postnasal drip, rhinorrhea, sinus pressure and sneezing.   Eyes: Negative for redness, itching and visual disturbance.  Respiratory: Negative for cough, chest tightness, shortness of breath and wheezing.   Cardiovascular: Negative for chest pain, palpitations and leg swelling.  Gastrointestinal: Negative for nausea, vomiting, abdominal pain, diarrhea, constipation, blood in stool and abdominal distention.  Musculoskeletal: Negative for arthralgias, gait problem, joint swelling, myalgias, neck pain and neck stiffness.  Skin: Negative for rash.  Neurological: Negative for dizziness, light-headedness, numbness and headaches.  Hematological: Does not bruise/bleed  easily.  Psychiatric/Behavioral: Negative for confusion and dysphoric mood.       Objective:   Physical Exam Filed Vitals:   12/08/13 1450  BP: 128/78  Pulse: 94  Height: 5' 4.5" (1.638 m)  Weight: 172 lb (78.019 kg)  SpO2: 99%  RA  Gen: well appearing, no acute distress  HEENT: NCAT, PERRL, EOMi, OP clear, neck supple without masses PULM: CTA B CV: RRR, no mgr, no JVD AB: BS+, soft, nontender, no hsm Ext: warm, no edema, no clubbing, no cyanosis Derm: no rash or skin breakdown Neuro: A&Ox4, CN II-XII intact, strength 5/5 in all 4 extremities   12/08/2013 Simple spirometry> Ratio 755, FEV1 2.09L (80% pred), FEF 25-75 52% pred, flow volume loop suggestive of small airways disease     Assessment & Plan:   Asthma with acute exacerbation He has a history consistent with COPD and that he smoked significantly for many years and his recent symptoms of chest tightness, wheezing, and shortness of breath are certainly suggestive of this. However, simple spirometry today is not consistent with COPD but the flow volume loop is suggestive of small airways disease and some degree of airflow obstruction.  Because he is feeling well I'm inclined to keep him on his current management but we need to get full pulmonary function testing to look for evidence of bronchial hyperreactivity as well as lung volumes and DLCO.  Plan: -Obtain full pulmonary function testing -Continue Spiriva and Qvar for now -Complete prednisone taper -Depending on results of pulmonary function testing may need to evaluate more for asthma with allergy testing, eosinophil count etc.  Dyspnea She is now feeling much better after receiving prednisone. Because I do not see evidence of COPD on simple spirometry, physical exam, or chest x-ray going to obtain an echocardiogram to look for other causes of dyspnea. He may need to followup with cardiology again and consider a stress test.    Updated Medication List Outpatient  Encounter Prescriptions as of 12/08/2013  Medication Sig  . albuterol (PROVENTIL) (2.5 MG/3ML) 0.083% nebulizer solution Take 3 mLs (2.5 mg total) by nebulization every 6 (six) hours as needed for wheezing or shortness of breath.  [name] aspirin EC 81 MG tablet Take 1 tablet (81 mg total) by mouth daily.  [name] atorvastatin (LIPITOR) 10 MG tablet Take 10 mg by mouth daily at 6 PM.  . beclomethasone (QVAR) 80 MCG/ACT inhaler Inhale 2 puffs into the lungs 2 (two) times daily.  . benzonatate (TESSALON) 100 MG capsule Take 1 capsule (100 mg total) by mouth 3 (three) times daily.  . cholecalciferol (VITAMIN D) 1000 UNITS tablet Take 1 tablet (1,000 Units total) by mouth daily.  . cilostazol (PLETAL) 100 MG tablet Take 100 mg by mouth 2 (two) times daily.  [name] gabapentin (NEURONTIN) 100 MG capsule Take 1 capsule (100 mg total) by mouth 3 (three) times daily.  [name] glipiZIDE (GLUCOTROL) 5 MG tablet Take 1 tablet (5 mg total) by mouth 2 (two) times daily before a meal.  . guaiFENesin-codeine 100-10 MG/5ML syrup Take 10 mLs by mouth 3 (three) times daily as needed for cough.  . hydrOXYzine (ATARAX/VISTARIL) 25 MG tablet Take 25 mg by mouth every 8 (eight) hours as needed for anxiety.  [name] lisinopril (PRINIVIL,ZESTRIL) 40 MG tablet Take 1 tablet (40 mg total) by mouth daily.  . metFORMIN (GLUCOPHAGE) 1000 MG tablet Take 1 tablet (1,000 mg total) by mouth 2 (two) times daily with a meal.  . omeprazole (PRILOSEC) 40 MG capsule Take 1 capsule (40 mg total) by mouth daily.  . predniSONE (DELTASONE) 20 MG tablet Take 1 tablet (20 mg total) by mouth daily.  . rosuvastatin (CRESTOR) 20 MG tablet Take 1 tablet (20 mg total) by mouth daily.  [name] tiotropium (SPIRIVA) 18 MCG inhalation capsule Place 1 capsule (18 mcg total) into inhaler and inhale  daily.  . zolpidem (AMBIEN) 5 MG tablet Take 1 tablet (5 mg total) by mouth at bedtime.  . [DISCONTINUED] levofloxacin (LEVAQUIN) 500 MG tablet Take 1 tablet (500 mg total) by mouth daily.

## 2013-12-08 NOTE — Assessment & Plan Note (Signed)
She is now feeling much better after receiving prednisone. Because I do not see evidence of COPD on simple spirometry, physical exam, or chest x-ray going to obtain an echocardiogram to look for other causes of dyspnea. He may need to followup with cardiology again and consider a stress test.

## 2013-12-08 NOTE — Patient Instructions (Signed)
We will arrange a pulmonary function test and an echocardiogram for you and see you after that Keep taking your inhalers as you are doing We will see you back in 2 weeks

## 2013-12-08 NOTE — Assessment & Plan Note (Signed)
He has a history consistent with COPD and that he smoked significantly for many years and his recent symptoms of chest tightness, wheezing, and shortness of breath are certainly suggestive of this. However, simple spirometry today is not consistent with COPD but the flow volume loop is suggestive of small airways disease and some degree of airflow obstruction.  Because he is feeling well I'm inclined to keep him on his current management but we need to get full pulmonary function testing to look for evidence of bronchial hyperreactivity as well as lung volumes and DLCO.  Plan: -Obtain full pulmonary function testing -Continue Spiriva and Qvar for now -Complete prednisone taper -Depending on results of pulmonary function testing may need to evaluate more for asthma with allergy testing, eosinophil count etc.

## 2013-12-10 ENCOUNTER — Ambulatory Visit (HOSPITAL_COMMUNITY)
Admission: RE | Admit: 2013-12-10 | Discharge: 2013-12-10 | Disposition: A | Discharge status: Home / Self Care | Payer: Medicare HMO | Source: Ambulatory Visit | Attending: Cardiology | Admitting: Cardiology

## 2013-12-10 DIAGNOSIS — R0600 Dyspnea, unspecified: Secondary | ICD-10-CM | POA: Insufficient documentation

## 2013-12-10 DIAGNOSIS — R0602 Shortness of breath: Secondary | ICD-10-CM

## 2013-12-10 DIAGNOSIS — J449 Chronic obstructive pulmonary disease, unspecified: Secondary | ICD-10-CM | POA: Insufficient documentation

## 2013-12-10 DIAGNOSIS — I369 Nonrheumatic tricuspid valve disorder, unspecified: Secondary | ICD-10-CM

## 2013-12-10 NOTE — Progress Notes (Signed)
2D Echo Performed 12/10/2013    [name], RCS  

## 2013-12-11 ENCOUNTER — Encounter: Payer: Self-pay | Admitting: Pulmonary Disease

## 2013-12-11 ENCOUNTER — Telehealth: Payer: Self-pay | Admitting: Pulmonary Disease

## 2013-12-11 NOTE — Progress Notes (Signed)
Quick Note:  Pt aware of results. Nothing further needed. ______ 

## 2013-12-11 NOTE — Telephone Encounter (Signed)
A, Please let him know that this was OK Thanks B (regarding echocardiogram) --------------------------  Spoke with pt, he is aware of results.  Nothing further needed.

## 2013-12-21 ENCOUNTER — Ambulatory Visit (HOSPITAL_COMMUNITY)
Admission: RE | Admit: 2013-12-21 | Discharge: 2013-12-21 | Disposition: A | Discharge status: Home / Self Care | Payer: Medicare HMO | Source: Ambulatory Visit | Attending: Pulmonary Disease | Admitting: Pulmonary Disease

## 2013-12-21 ENCOUNTER — Ambulatory Visit (INDEPENDENT_AMBULATORY_CARE_PROVIDER_SITE_OTHER): Payer: Commercial Managed Care - HMO | Admitting: Pulmonary Disease

## 2013-12-21 ENCOUNTER — Encounter: Payer: Self-pay | Admitting: Pulmonary Disease

## 2013-12-21 VITALS — BP 128/64 | HR 108 | Ht 64.5 in | Wt 172.0 lb

## 2013-12-21 DIAGNOSIS — J441 Chronic obstructive pulmonary disease with (acute) exacerbation: Secondary | ICD-10-CM | POA: Insufficient documentation

## 2013-12-21 DIAGNOSIS — J4521 Mild intermittent asthma with (acute) exacerbation: Secondary | ICD-10-CM

## 2013-12-21 DIAGNOSIS — R252 Cramp and spasm: Secondary | ICD-10-CM | POA: Insufficient documentation

## 2013-12-21 LAB — PULMONARY FUNCTION TEST
DL/VA % PRED: 106 %
DL/VA: 4.47 ml/min/mmHg/L
DLCO UNC % PRED: 82 %
DLCO unc: 20.05 ml/min/mmHg
FEF 25-75 POST: 4.38 L/s
FEF 25-75 Pre: 1.59 L/sec
FEF2575-%Change-Post: 175 %
FEF2575-%PRED-PRE: 69 %
FEF2575-%Pred-Post: 192 %
FEV1-%Change-Post: 26 %
FEV1-%Pred-Post: 114 %
FEV1-%Pred-Pre: 90 %
FEV1-POST: 2.73 L
FEV1-PRE: 2.15 L
FEV1FVC-%CHANGE-POST: 6 %
FEV1FVC-%PRED-PRE: 95 %
FEV6-%Change-Post: 18 %
FEV6-%PRED-PRE: 96 %
FEV6-%Pred-Post: 113 %
FEV6-Post: 3.38 L
FEV6-Pre: 2.87 L
FEV6FVC-%Change-Post: 0 %
FEV6FVC-%Pred-Post: 103 %
FEV6FVC-%Pred-Pre: 104 %
FVC-%CHANGE-POST: 18 %
FVC-%PRED-POST: 110 %
FVC-%Pred-Pre: 93 %
FVC-Post: 3.43 L
FVC-Pre: 2.89 L
PRE FEV1/FVC RATIO: 74 %
Post FEV1/FVC ratio: 80 %
Post FEV6/FVC ratio: 99 %
Pre FEV6/FVC Ratio: 99 %
RV % pred: 91 %
RV: 1.79 L
TLC % pred: 87 %
TLC: 5.07 L

## 2013-12-21 MED ORDER — ALBUTEROL SULFATE (2.5 MG/3ML) 0.083% IN NEBU
2.5000 mg | INHALATION_SOLUTION | Freq: Once | RESPIRATORY_TRACT | Status: AC
  Administered 2013-12-21: 2.5 mg via RESPIRATORY_TRACT

## 2013-12-21 NOTE — Assessment & Plan Note (Signed)
He does not have COPD. Further, he may have very mild asthma but he had only small airways obstruction seen on his recent lung function testing.  What was most significant is the fact that after given albuterol he had variable upper airway obstruction. This is consistent with the diagnosis of vocal cord dysfunction. Notably, considering the fact that the inspiratory loop prior to albuterol demonstrated normal airflow and the post albuterol inspiratory loop demonstrated a near perfect upper airway fixed airway obstruction this is consistent with vocal cord dysfunction. This would explain the reason why he was recently admitted with chest tightness, wheezing, and shortness of breath.  Plan: -At this point he does not need long acting bronchodilators -Should he have a repeat episode of bronchitis I advised that he return to our clinic so that we can evaluate -For future episodes of upper respiratory infection I strongly recommend that he take care of his vocal cords as best as possible. Specifically what I mean is that he should postnasal drip aggressively with decongestants and saline and should use cough suppressants and avoid throat clearing. -If there is further question about the possibility of asthma versus vocal cord dysfunction that he could potentially undergo a methacholine challenge and ENT evaluation

## 2013-12-21 NOTE — Assessment & Plan Note (Signed)
I think this may be related to his Spiriva use. I advised him to stop taking that medication and he is to followup with his primary care physician should the symptoms persist.

## 2013-12-21 NOTE — Patient Instructions (Signed)
Stop taking the Spiriva daily When you get a cold with a cough I recommend the following: You need to try to suppress your cough to allow your larynx (voice box) to heal.  For three days don't talk, laugh, sing, or clear your throat. Do everything you can to suppress the cough during this time. Use hard candies (sugarless Jolly Ranchers) or non-mint or non-menthol containing cough drops during this time to soothe your throat.  Use a cough suppressant (Delsym) around the clock during this time.  After three days, gradually increase the use of your voice and back off on the cough suppressants.  Use albuterol 2 puffs every four hours as needed for shortness of breath  WE will see you back as needed

## 2013-12-21 NOTE — Progress Notes (Signed)
Subjective:    Patient ID: [name], male    DOB: Mar 19, 1950, 63 y.o.   MRN: [id number]  Synopsis: First referred to the [facility] in 2015 for the possibility of COPD. He has significant smoking history and was admitted to [facility] on the [facility] in 2015 for what was thought to be a COPD exacerbation. However, lung function testing did not show clear evidence of airflow obstruction. 12/08/2013 Simple spirometry> Ratio 755, FEV1 2.09L (80% pred), FEF 25-75 52% pred, flow volume loop suggestive of small airways disease 12/21/2013 full pulmonary function testing ratio 80%, FEV1 2.73 L (114% predicted, 26% change with bronchodilator), total lung capacity 5.07 L (87% predicted), DLCO 20.5 (82% predicted)  HPI Chief Complaint  Patient presents with  . Follow-up    [facility]  Pt c/o b/l leg cramping, r hand cramping at night, no breathing complaints.     12/21/2013 ROV> [name] has been doing OK.  He has not had any shortness of breaht.  He walked 3 miles this morning and had no problems breathing.  He has been having a lot of cramping in his arms, hands and feet.  He says this started around the time that he started taking the inhalers (Spiriva). Otherwise his breathing has been fine. He has not had significant sinus congestion. He has been exercising regularly on a daily basis without difficulty.  Past Medical History  Diagnosis Date  . Diabetes mellitus   . Hypertension   . Coronary artery disease   . Hepatitis C   . High cholesterol   . Arthritis   . Hepatitis   . COPD (chronic obstructive pulmonary disease)      Review of Systems     Objective:   Physical Exam Filed Vitals:   12/21/13 1548  BP: 128/64  Pulse: 108  Height: 5' 4.5" (1.638 m)  Weight: 172 lb (78.019 kg)  SpO2: 99%    RA  Gen: well appearing, no acute distress HEENT: NCAT, EOMi, OP clear,  PULM: CTA B CV: RRR, no mgr, no JVD AB: BS+, soft, nontender,  Ext:  warm, no edema, no clubbing, no cyanosis Derm: no rash or skin breakdown Neuro: A&Ox4, MAEW      Assessment & Plan:   Mild intermittent asthma He does not have COPD. Further, he may have very mild asthma but he had only small airways obstruction seen on his recent lung function testing.  What was most significant is the fact that after given albuterol he had variable upper airway obstruction. This is consistent with the diagnosis of vocal cord dysfunction. Notably, considering the fact that the inspiratory loop prior to albuterol demonstrated normal airflow and the post albuterol inspiratory loop demonstrated a near perfect upper airway fixed airway obstruction this is consistent with vocal cord dysfunction. This would explain the reason why he was recently admitted with chest tightness, wheezing, and shortness of breath.  Plan: -At this point he does not need long acting bronchodilators -Should he have a repeat episode of bronchitis I advised that he return to our clinic so that we can evaluate -For future episodes of upper respiratory infection I strongly recommend that he take care of his vocal cords as best as possible. Specifically what I mean is that he should postnasal drip aggressively with decongestants and saline and should use cough suppressants and avoid throat clearing. -If there is further question about the possibility of asthma versus vocal cord dysfunction that he could  potentially undergo a methacholine challenge and ENT evaluation  Cramping of hands I think this may be related to his Spiriva use. I advised him to stop taking that medication and he is to followup with his primary care physician should the symptoms persist.    Updated Medication List Outpatient Encounter Prescriptions as of 12/21/2013  Medication Sig  . albuterol (PROVENTIL) (2.5 MG/3ML) 0.083% nebulizer solution Take 3 mLs (2.5 mg total) by nebulization every 6 (six) hours as needed for wheezing or  shortness of breath.  [name] aspirin EC 81 MG tablet Take 1 tablet (81 mg total) by mouth daily.  [name] atorvastatin (LIPITOR) 10 MG tablet Take 10 mg by mouth daily at 6 PM.  . beclomethasone (QVAR) 80 MCG/ACT inhaler Inhale 2 puffs into the lungs 2 (two) times daily.  . cilostazol (PLETAL) 100 MG tablet Take 100 mg by mouth 2 (two) times daily.  [name] gabapentin (NEURONTIN) 100 MG capsule Take 1 capsule (100 mg total) by mouth 3 (three) times daily.  [name] glipiZIDE (GLUCOTROL) 5 MG tablet Take 1 tablet (5 mg total) by mouth 2 (two) times daily before a meal.  . hydrOXYzine (ATARAX/VISTARIL) 25 MG tablet Take 25 mg by mouth every 8 (eight) hours as needed for anxiety.  [name] lisinopril (PRINIVIL,ZESTRIL) 40 MG tablet Take 1 tablet (40 mg total) by mouth daily.  . metFORMIN (GLUCOPHAGE) 1000 MG tablet Take 1 tablet (1,000 mg total) by mouth 2 (two) times daily with a meal.  . omeprazole (PRILOSEC) 40 MG capsule Take 1 capsule (40 mg total) by mouth daily.  . rosuvastatin (CRESTOR) 20 MG tablet Take 1 tablet (20 mg total) by mouth daily.  [name] tiotropium (SPIRIVA) 18 MCG inhalation capsule Place 1 capsule (18 mcg total) into inhaler and inhale daily.  . cholecalciferol (VITAMIN D) 1000 UNITS tablet Take 1 tablet (1,000 Units total) by mouth daily.  [name] zolpidem (AMBIEN) 5 MG tablet Take 1 tablet (5 mg total) by mouth at bedtime.  . [DISCONTINUED] benzonatate (TESSALON) 100 MG capsule Take 1 capsule (100 mg total) by mouth 3 (three) times daily.  . [DISCONTINUED] guaiFENesin-codeine 100-10 MG/5ML syrup Take 10 mLs by mouth 3 (three) times daily as needed for cough.  . [DISCONTINUED] predniSONE (DELTASONE) 20 MG tablet Take 1 tablet (20 mg total) by mouth daily.

## 2013-12-22 ENCOUNTER — Telehealth: Payer: Self-pay | Admitting: Internal Medicine

## 2013-12-22 NOTE — Telephone Encounter (Signed)
Patient has come in today to see if he can receive a call about his Blood Sugar; patient's medications have changed since he left the hospital and patient wants to confirm he is still in compliance with PCP's instructions; please f/u with patient

## 2013-12-24 ENCOUNTER — Telehealth: Payer: Self-pay | Admitting: Emergency Medicine

## 2013-12-24 ENCOUNTER — Telehealth: Payer: Self-pay | Admitting: Internal Medicine

## 2013-12-24 NOTE — Telephone Encounter (Signed)
Pt called in to schedule nurse visit to check CBG States medications were adjusted with hospital admission and high readings due to Steroid medication Instructed pt to come in tomorrow for nurse visit

## 2013-12-24 NOTE — Telephone Encounter (Signed)
Patient has called to ask about clarification on medication instructions; please f/u with patient

## 2013-12-25 ENCOUNTER — Telehealth: Payer: Self-pay | Admitting: Emergency Medicine

## 2013-12-25 NOTE — Telephone Encounter (Signed)
Pt comes in today with questions/concerns regarding CBG with medications States Glipizide was increased during last hospital visit due to Steroid treatment States Dr. Annitta Needs instructed him to take 1 tablet of Glipizide (5mg ) with Metformin 1,000 mg BID CBGs at home ranging 160's  CBG- 171 instructed pt to continue taking medications ad prescribed in hospital until seen by provider 12/30/13

## 2013-12-30 ENCOUNTER — Encounter: Payer: Self-pay | Admitting: Internal Medicine

## 2013-12-30 ENCOUNTER — Ambulatory Visit: Payer: Medicare HMO | Attending: Internal Medicine | Admitting: Internal Medicine

## 2013-12-30 VITALS — BP 130/70 | HR 72 | Temp 98.3°F | Resp 16 | Wt 170.4 lb

## 2013-12-30 DIAGNOSIS — J4521 Mild intermittent asthma with (acute) exacerbation: Secondary | ICD-10-CM

## 2013-12-30 DIAGNOSIS — B1920 Unspecified viral hepatitis C without hepatic coma: Secondary | ICD-10-CM | POA: Diagnosis not present

## 2013-12-30 DIAGNOSIS — I739 Peripheral vascular disease, unspecified: Secondary | ICD-10-CM | POA: Insufficient documentation

## 2013-12-30 DIAGNOSIS — E780 Pure hypercholesterolemia: Secondary | ICD-10-CM | POA: Insufficient documentation

## 2013-12-30 DIAGNOSIS — E119 Type 2 diabetes mellitus without complications: Secondary | ICD-10-CM | POA: Diagnosis not present

## 2013-12-30 DIAGNOSIS — Z87891 Personal history of nicotine dependence: Secondary | ICD-10-CM | POA: Diagnosis not present

## 2013-12-30 DIAGNOSIS — I10 Essential (primary) hypertension: Secondary | ICD-10-CM | POA: Insufficient documentation

## 2013-12-30 DIAGNOSIS — J449 Chronic obstructive pulmonary disease, unspecified: Secondary | ICD-10-CM | POA: Insufficient documentation

## 2013-12-30 DIAGNOSIS — I2510 Atherosclerotic heart disease of native coronary artery without angina pectoris: Secondary | ICD-10-CM | POA: Diagnosis not present

## 2013-12-30 DIAGNOSIS — J45901 Unspecified asthma with (acute) exacerbation: Secondary | ICD-10-CM | POA: Insufficient documentation

## 2013-12-30 DIAGNOSIS — E139 Other specified diabetes mellitus without complications: Secondary | ICD-10-CM

## 2013-12-30 DIAGNOSIS — R252 Cramp and spasm: Secondary | ICD-10-CM | POA: Insufficient documentation

## 2013-12-30 LAB — GLUCOSE, POCT (MANUAL RESULT ENTRY): POC GLUCOSE: 273 mg/dL — AB (ref 70–99)

## 2013-12-30 MED ORDER — INSULIN ASPART 100 UNIT/ML ~~LOC~~ SOLN
10.0000 [IU] | Freq: Once | SUBCUTANEOUS | Status: AC
  Administered 2013-12-30: 10 [IU] via SUBCUTANEOUS

## 2013-12-30 NOTE — Patient Instructions (Signed)
Diabetes Mellitus and Food It is important for you to manage your blood sugar (glucose) level. Your blood glucose level can be greatly affected by what you eat. Eating healthier foods in the appropriate amounts throughout the day at about the same time each day will help you control your blood glucose level. It can also help slow or prevent worsening of your diabetes mellitus. Healthy eating may even help you improve the level of your blood pressure and reach or maintain a healthy weight.  HOW CAN FOOD AFFECT ME? Carbohydrates Carbohydrates affect your blood glucose level more than any other type of food. Your dietitian will help you determine how many carbohydrates to eat at each meal and teach you how to count carbohydrates. Counting carbohydrates is important to keep your blood glucose at a healthy level, especially if you are using insulin or taking certain medicines for diabetes mellitus. Alcohol Alcohol can cause sudden decreases in blood glucose (hypoglycemia), especially if you use insulin or take certain medicines for diabetes mellitus. Hypoglycemia can be a life-threatening condition. Symptoms of hypoglycemia (sleepiness, dizziness, and disorientation) are similar to symptoms of having too much alcohol.  If your health care provider has given you approval to drink alcohol, do so in moderation and use the following guidelines:  Women should not have more than one drink per day, and men should not have more than two drinks per day. One drink is equal to:  12 oz of beer.  5 oz of wine.  1 oz of hard liquor.  Do not drink on an empty stomach.  Keep yourself hydrated. Have water, diet soda, or unsweetened iced tea.  Regular soda, juice, and other mixers might contain a lot of carbohydrates and should be counted. WHAT FOODS ARE NOT RECOMMENDED? As you make food choices, it is important to remember that all foods are not the same. Some foods have fewer nutrients per serving than other  foods, even though they might have the same number of calories or carbohydrates. It is difficult to get your body what it needs when you eat foods with fewer nutrients. Examples of foods that you should avoid that are high in calories and carbohydrates but low in nutrients include:  Trans fats (most processed foods list trans fats on the Nutrition Facts label).  Regular soda.  Juice.  Candy.  Sweets, such as cake, pie, doughnuts, and cookies.  Fried foods. WHAT FOODS CAN I EAT? Have nutrient-rich foods, which will nourish your body and keep you healthy. The food you should eat also will depend on several factors, including:  The calories you need.  The medicines you take.  Your weight.  Your blood glucose level.  Your blood pressure level.  Your cholesterol level. You also should eat a variety of foods, including:  Protein, such as meat, poultry, fish, tofu, nuts, and seeds (lean animal proteins are best).  Fruits.  Vegetables.  Dairy products, such as milk, cheese, and yogurt (low fat is best).  Breads, grains, pasta, cereal, rice, and beans.  Fats such as olive oil, trans fat-free margarine, canola oil, avocado, and olives. DOES EVERYONE WITH DIABETES MELLITUS HAVE THE SAME MEAL PLAN? Because every person with diabetes mellitus is different, there is not one meal plan that works for everyone. It is very important that you meet with a dietitian who will help you create a meal plan that is just right for you. Document Released: 11/16/2004 Document Revised: 02/24/2013 Document Reviewed: 01/16/2013 ExitCare Patient Information 2015 [employer]. This   information is not intended to replace advice given to you by your health care provider. Make sure you discuss any questions you have with your health care provider. DASH Eating Plan DASH stands for "Dietary Approaches to Stop Hypertension." The DASH eating plan is a healthy eating plan that has been shown to reduce high  blood pressure (hypertension). Additional health benefits may include reducing the risk of type 2 diabetes mellitus, heart disease, and stroke. The DASH eating plan may also help with weight loss. WHAT DO I NEED TO KNOW ABOUT THE DASH EATING PLAN? For the DASH eating plan, you will follow these general guidelines:  Choose foods with a percent daily value for sodium of less than 5% (as listed on the food label).  Use salt-free seasonings or herbs instead of table salt or sea salt.  Check with your health care provider or pharmacist before using salt substitutes.  Eat lower-sodium products, often labeled as "lower sodium" or "no salt added."  Eat fresh foods.  Eat more vegetables, fruits, and low-fat dairy products.  Choose whole grains. Look for the word "whole" as the first word in the ingredient list.  Choose fish and skinless chicken or turkey more often than red meat. Limit fish, poultry, and meat to 6 oz (170 g) each day.  Limit sweets, desserts, sugars, and sugary drinks.  Choose heart-healthy fats.  Limit cheese to 1 oz (28 g) per day.  Eat more home-cooked food and less restaurant, buffet, and fast food.  Limit fried foods.  Cook foods using methods other than frying.  Limit canned vegetables. If you do use them, rinse them well to decrease the sodium.  When eating at a restaurant, ask that your food be prepared with less salt, or no salt if possible. WHAT FOODS CAN I EAT? Seek help from a dietitian for individual calorie needs. Grains Whole grain or whole wheat bread. Brown rice. Whole grain or whole wheat pasta. Quinoa, bulgur, and whole grain cereals. Low-sodium cereals. Corn or whole wheat flour tortillas. Whole grain cornbread. Whole grain crackers. Low-sodium crackers. Vegetables Fresh or frozen vegetables (raw, steamed, roasted, or grilled). Low-sodium or reduced-sodium tomato and vegetable juices. Low-sodium or reduced-sodium tomato sauce and paste. Low-sodium  or reduced-sodium canned vegetables.  Fruits All fresh, canned (in natural juice), or frozen fruits. Meat and Other Protein Products Ground beef (85% or leaner), grass-fed beef, or beef trimmed of fat. Skinless chicken or turkey. Ground chicken or turkey. Pork trimmed of fat. All fish and seafood. Eggs. Dried beans, peas, or lentils. Unsalted nuts and seeds. Unsalted canned beans. Dairy Low-fat dairy products, such as skim or 1% milk, 2% or reduced-fat cheeses, low-fat ricotta or cottage cheese, or plain low-fat yogurt. Low-sodium or reduced-sodium cheeses. Fats and Oils Tub margarines without trans fats. Light or reduced-fat mayonnaise and salad dressings (reduced sodium). Avocado. Safflower, olive, or canola oils. Natural peanut or almond butter. Other Unsalted popcorn and pretzels. The items listed above may not be a complete list of recommended foods or beverages. Contact your dietitian for more options. WHAT FOODS ARE NOT RECOMMENDED? Grains White bread. White pasta. White rice. Refined cornbread. Bagels and croissants. Crackers that contain trans fat. Vegetables Creamed or fried vegetables. Vegetables in a cheese sauce. Regular canned vegetables. Regular canned tomato sauce and paste. Regular tomato and vegetable juices. Fruits Dried fruits. Canned fruit in light or heavy syrup. Fruit juice. Meat and Other Protein Products Fatty cuts of meat. Ribs, chicken wings, bacon, sausage, bologna, salami, chitterlings, fatback, hot   dogs, bratwurst, and packaged luncheon meats. Salted nuts and seeds. Canned beans with salt. Dairy Whole or 2% milk, cream, half-and-half, and cream cheese. Whole-fat or sweetened yogurt. Full-fat cheeses or blue cheese. Nondairy creamers and whipped toppings. Processed cheese, cheese spreads, or cheese curds. Condiments Onion and garlic salt, seasoned salt, table salt, and sea salt. Canned and packaged gravies. Worcestershire sauce. Tartar sauce. Barbecue sauce.  Teriyaki sauce. Soy sauce, including reduced sodium. Steak sauce. Fish sauce. Oyster sauce. Cocktail sauce. Horseradish. Ketchup and mustard. Meat flavorings and tenderizers. Bouillon cubes. Hot sauce. Tabasco sauce. Marinades. Taco seasonings. Relishes. Fats and Oils Butter, stick margarine, lard, shortening, ghee, and bacon fat. Coconut, palm kernel, or palm oils. Regular salad dressings. Other Pickles and olives. Salted popcorn and pretzels. The items listed above may not be a complete list of foods and beverages to avoid. Contact your dietitian for more information. WHERE CAN I FIND MORE INFORMATION? National Heart, [employer], [employer] Institute: www.nhlbi.nih.gov/health/health-topics/topics/dash/ Document Released: 02/08/2011 Document Revised: 07/06/2013 Document Reviewed: 12/24/2012 ExitCare Patient Information 2015 [employer]. This information is not intended to replace advice given to you by your health care provider. Make sure you discuss any questions you have with your health care provider.  

## 2013-12-30 NOTE — Progress Notes (Signed)
Patient here for follow up on his diabetes Recently in hospital for an asthma flare up Complains of having cramps to bilateral legs

## 2013-12-30 NOTE — Progress Notes (Signed)
MRN: [id number] Name: [name]  Sex: male Age: 63 y.o. DOB: [birth year]  Allergies: Mobic  Chief Complaint  Patient presents with  . Follow-up    HPI: Patient is 63 y.o. male who has is she of hypertension, diabetes, PVD, mild intermittent asthma comes today for followup patient also has history of cataract and has already been referred to ophthalmology, patient does complain of some claudication and leg cramps, he has lost follow up with his vascular Dr., is advised to make a followup appointment, initially his blood pressure was elevated, repeat manual blood pressure is 130/70, as per patient is compliant with his medications as well as trying to control his diabetes, he is modifying his diet, area this month he was hospitalized and his sugars were really high because he was on prednisone, currently he's taking metformin thousand milligram twice a day and Glucotrol 5 mg twice a day his fasting sugar is usually between 90 to 30 milligrams a deciliter but sometimes it is higher than that, also following up with the pulmonologist and currently on Qvar denies any chest and shortness of breath. Patient also had echocardiogram done reported to have EF of 60-65%.  Past Medical History  Diagnosis Date  . Diabetes mellitus   . Hypertension   . Coronary artery disease   . Hepatitis C   . High cholesterol   . Arthritis   . Hepatitis   . COPD (chronic obstructive pulmonary disease)     Past Surgical History  Procedure Laterality Date  . Esophagogastroduodenoscopy  02/09/2011    Procedure: ESOPHAGOGASTRODUODENOSCOPY (EGD);  Surgeon: Missy Sabins, MD;  Location: [facility] ENDOSCOPY;  Service: Endoscopy;  Laterality: N/A;  . Joint replacement  10/22/2009    knee      Medication List       This list is accurate as of: 12/30/13  5:49 PM.  Always use your most recent med list.               albuterol (2.5 MG/3ML) 0.083% nebulizer solution  Commonly known as:  PROVENTIL  Take 3 mLs (2.5  mg total) by nebulization every 6 (six) hours as needed for wheezing or shortness of breath.     aspirin EC 81 MG tablet  Take 1 tablet (81 mg total) by mouth daily.     atorvastatin 10 MG tablet  Commonly known as:  LIPITOR  Take 10 mg by mouth daily at 6 PM.     beclomethasone 80 MCG/ACT inhaler  Commonly known as:  QVAR  Inhale 2 puffs into the lungs 2 (two) times daily.     cholecalciferol 1000 UNITS tablet  Commonly known as:  VITAMIN D  Take 1 tablet (1,000 Units total) by mouth daily.     cilostazol 100 MG tablet  Commonly known as:  PLETAL  Take 100 mg by mouth 2 (two) times daily.     gabapentin 100 MG capsule  Commonly known as:  NEURONTIN  Take 1 capsule (100 mg total) by mouth 3 (three) times daily.     glipiZIDE 5 MG tablet  Commonly known as:  GLUCOTROL  Take 1 tablet (5 mg total) by mouth 2 (two) times daily before a meal.     hydrOXYzine 25 MG tablet  Commonly known as:  ATARAX/VISTARIL  Take 25 mg by mouth every 8 (eight) hours as needed for anxiety.     lisinopril 40 MG tablet  Commonly known as:  PRINIVIL,ZESTRIL  Take 1 tablet (40 mg  total) by mouth daily.     metFORMIN 1000 MG tablet  Commonly known as:  GLUCOPHAGE  Take 1 tablet (1,000 mg total) by mouth 2 (two) times daily with a meal.     omeprazole 40 MG capsule  Commonly known as:  PRILOSEC  Take 1 capsule (40 mg total) by mouth daily.     rosuvastatin 20 MG tablet  Commonly known as:  CRESTOR  Take 1 tablet (20 mg total) by mouth daily.     tiotropium 18 MCG inhalation capsule  Commonly known as:  SPIRIVA  Place 1 capsule (18 mcg total) into inhaler and inhale daily.     zolpidem 5 MG tablet  Commonly known as:  AMBIEN  Take 1 tablet (5 mg total) by mouth at bedtime.        Meds ordered this encounter  Medications  . insulin aspart (novoLOG) injection 10 Units    Sig:     Immunization History  Administered Date(s) Administered  . Influenza Split 02/10/2011  . Influenza,  Seasonal, Injecte, Preservative Fre 12/19/2012  . Influenza,inj,Quad PF,36+ Mos 12/02/2013  . Pneumococcal Polysaccharide-23 12/02/2013    Family History  Problem Relation Age of Onset  . Stroke Mother   . Diabetes Mother   . Hypertension Mother   . Hyperlipidemia Mother   . Heart disease Father   . Hypertension Father   . Heart attack Father     History  Substance Use Topics  . Smoking status: Former Smoker -- 1.50 packs/day for 30 years    Types: Cigarettes    Quit date: 03/19/2013  . Smokeless tobacco: Never Used  . 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  Eyes: EOM are normal. Pupils are equal, round, and reactive to light.  Cardiovascular: Normal rate and regular rhythm.   Pulmonary/Chest: Breath sounds normal. No respiratory distress. He has no wheezes. He has no rales.  Musculoskeletal: He exhibits no edema.    CBC    Component Value Date/Time   WBC 6.6 12/02/2013 0425   RBC 4.39 12/02/2013 0425   HGB 12.3* 12/02/2013 0425   HCT 36.0* 12/02/2013 0425   PLT 229 12/02/2013 0425   MCV 82.0 12/02/2013 0425   LYMPHSABS 3.9 12/01/2013 0045   MONOABS 0.5 12/01/2013 0045   EOSABS 0.7 12/01/2013 0045   BASOSABS 0.0 12/01/2013 0045    CMP     Component Value Date/Time   NA 134* 12/03/2013 0540   K 5.0 12/03/2013 0540   CL 98 12/03/2013 0540   CO2 23 12/03/2013 0540   GLUCOSE 332* 12/03/2013 0540   BUN 25* 12/03/2013 0540   CREATININE 0.72 12/03/2013 0540   CREATININE 0.87 03/17/2013 1529   CALCIUM 8.6 12/03/2013 0540   PROT 7.4 12/01/2013 0522   ALBUMIN 3.9 12/01/2013 0522   AST 73* 12/01/2013 0522   ALT 111* 12/01/2013 0522   ALKPHOS 58 12/01/2013 0522   BILITOT 0.4 12/01/2013 0522   GFRNONAA >90 12/03/2013 0540   GFRNONAA >89 03/17/2013 1529   GFRAA >90 12/03/2013 0540    GFRAA >89 03/17/2013 1529    Lab Results  Component Value Date/Time   CHOL 135 03/17/2013  3:29 PM    No components found  with this basename: hga1c    Lab Results  Component Value Date/Time   AST 73* 12/01/2013  5:22 AM    Assessment and Plan  Other specified diabetes mellitus without complications - Plan:  Results for orders placed in visit on 12/30/13  GLUCOSE, POCT (MANUAL RESULT ENTRY)      Result Value Ref Range   POC Glucose 273 (*) 70 - 99 mg/dl   patient is advised for diabetes meal planning he will continue with current meds, we'll check his A1c on the next visit Glucose (CBG), insulin aspart (novoLOG) injection 10 Units  Cramps of lower extremity, unspecified laterality  will check blood chemistry.   PVD (peripheral vascular disease)  patient to make a followup appointment with vascular.  Asthma with acute exacerbation, mild intermittent - Plan: Continue with Qvar symptoms are controlled. Followup with pulmonology.    Return in about 3 months (around 04/01/2014) for diabetes, hypertension.  Lorayne Marek, MD

## 2013-12-31 ENCOUNTER — Telehealth: Payer: Self-pay | Admitting: Pulmonary Disease

## 2013-12-31 LAB — COMPLETE METABOLIC PANEL WITH GFR
ALT: 106 U/L — AB (ref 0–53)
AST: 68 U/L — AB (ref 0–37)
Albumin: 4.3 g/dL (ref 3.5–5.2)
Alkaline Phosphatase: 58 U/L (ref 39–117)
BILIRUBIN TOTAL: 0.4 mg/dL (ref 0.2–1.2)
BUN: 11 mg/dL (ref 6–23)
CHLORIDE: 103 meq/L (ref 96–112)
CO2: 23 mEq/L (ref 19–32)
CREATININE: 0.78 mg/dL (ref 0.50–1.35)
Calcium: 9.3 mg/dL (ref 8.4–10.5)
GFR, Est African American: >89 mL/min
GFR, Est Non African American: >89 mL/min
Glucose, Bld: 259 mg/dL — ABNORMAL HIGH (ref 70–99)
Potassium: 4.1 mEq/L (ref 3.5–5.3)
Sodium: 139 mEq/L (ref 135–145)
Total Protein: 6.9 g/dL (ref 6.0–8.3)

## 2013-12-31 NOTE — Telephone Encounter (Signed)
lmomtcb x1 

## 2014-01-01 NOTE — Telephone Encounter (Signed)
Spoke with patient-he is aware that all notes from [facility] is in EPIC and [facility] office can see them as well. Nothing more needed at this time.

## 2014-01-12 ENCOUNTER — Emergency Department (HOSPITAL_COMMUNITY): Payer: Medicare HMO

## 2014-01-12 ENCOUNTER — Inpatient Hospital Stay (HOSPITAL_COMMUNITY)
Admission: EM | Admit: 2014-01-12 | Discharge: 2014-01-13 | DRG: 191 | Disposition: A | Discharge status: Home / Self Care | Payer: Medicare HMO | Attending: Family Medicine | Admitting: Family Medicine

## 2014-01-12 ENCOUNTER — Encounter (HOSPITAL_COMMUNITY): Payer: Self-pay | Admitting: *Deleted

## 2014-01-12 DIAGNOSIS — I70213 Atherosclerosis of native arteries of extremities with intermittent claudication, bilateral legs: Secondary | ICD-10-CM | POA: Diagnosis present

## 2014-01-12 DIAGNOSIS — E872 Acidosis, unspecified: Secondary | ICD-10-CM | POA: Insufficient documentation

## 2014-01-12 DIAGNOSIS — Z87891 Personal history of nicotine dependence: Secondary | ICD-10-CM

## 2014-01-12 DIAGNOSIS — B349 Viral infection, unspecified: Secondary | ICD-10-CM | POA: Diagnosis present

## 2014-01-12 DIAGNOSIS — Z823 Family history of stroke: Secondary | ICD-10-CM | POA: Diagnosis not present

## 2014-01-12 DIAGNOSIS — K219 Gastro-esophageal reflux disease without esophagitis: Secondary | ICD-10-CM | POA: Diagnosis present

## 2014-01-12 DIAGNOSIS — J4521 Mild intermittent asthma with (acute) exacerbation: Secondary | ICD-10-CM | POA: Diagnosis present

## 2014-01-12 DIAGNOSIS — I2510 Atherosclerotic heart disease of native coronary artery without angina pectoris: Secondary | ICD-10-CM | POA: Diagnosis present

## 2014-01-12 DIAGNOSIS — B1920 Unspecified viral hepatitis C without hepatic coma: Secondary | ICD-10-CM | POA: Diagnosis present

## 2014-01-12 DIAGNOSIS — R05 Cough: Secondary | ICD-10-CM

## 2014-01-12 DIAGNOSIS — M940 Chondrocostal junction syndrome [Tietze]: Secondary | ICD-10-CM | POA: Diagnosis present

## 2014-01-12 DIAGNOSIS — Z8249 Family history of ischemic heart disease and other diseases of the circulatory system: Secondary | ICD-10-CM

## 2014-01-12 DIAGNOSIS — F1010 Alcohol abuse, uncomplicated: Secondary | ICD-10-CM | POA: Diagnosis present

## 2014-01-12 DIAGNOSIS — Z7952 Long term (current) use of systemic steroids: Secondary | ICD-10-CM | POA: Diagnosis not present

## 2014-01-12 DIAGNOSIS — Z7982 Long term (current) use of aspirin: Secondary | ICD-10-CM | POA: Diagnosis not present

## 2014-01-12 DIAGNOSIS — Z833 Family history of diabetes mellitus: Secondary | ICD-10-CM | POA: Diagnosis not present

## 2014-01-12 DIAGNOSIS — I10 Essential (primary) hypertension: Secondary | ICD-10-CM | POA: Diagnosis present

## 2014-01-12 DIAGNOSIS — E1169 Type 2 diabetes mellitus with other specified complication: Secondary | ICD-10-CM | POA: Insufficient documentation

## 2014-01-12 DIAGNOSIS — I739 Peripheral vascular disease, unspecified: Secondary | ICD-10-CM | POA: Insufficient documentation

## 2014-01-12 DIAGNOSIS — J441 Chronic obstructive pulmonary disease with (acute) exacerbation: Secondary | ICD-10-CM | POA: Diagnosis not present

## 2014-01-12 DIAGNOSIS — E1165 Type 2 diabetes mellitus with hyperglycemia: Secondary | ICD-10-CM | POA: Diagnosis present

## 2014-01-12 DIAGNOSIS — Z79899 Other long term (current) drug therapy: Secondary | ICD-10-CM

## 2014-01-12 DIAGNOSIS — E785 Hyperlipidemia, unspecified: Secondary | ICD-10-CM | POA: Diagnosis present

## 2014-01-12 DIAGNOSIS — E119 Type 2 diabetes mellitus without complications: Secondary | ICD-10-CM | POA: Insufficient documentation

## 2014-01-12 DIAGNOSIS — E780 Pure hypercholesterolemia: Secondary | ICD-10-CM | POA: Diagnosis present

## 2014-01-12 DIAGNOSIS — R059 Cough, unspecified: Secondary | ICD-10-CM

## 2014-01-12 DIAGNOSIS — R0600 Dyspnea, unspecified: Secondary | ICD-10-CM

## 2014-01-12 DIAGNOSIS — R0602 Shortness of breath: Secondary | ICD-10-CM | POA: Diagnosis not present

## 2014-01-12 DIAGNOSIS — E860 Dehydration: Secondary | ICD-10-CM | POA: Diagnosis present

## 2014-01-12 HISTORY — DX: Unspecified asthma, uncomplicated: J45.909

## 2014-01-12 HISTORY — DX: Reserved for inherently not codable concepts without codable children: IMO0001

## 2014-01-12 LAB — GLUCOSE, CAPILLARY
GLUCOSE-CAPILLARY: 489 mg/dL — AB (ref 70–99)
Glucose-Capillary: 401 mg/dL — ABNORMAL HIGH (ref 70–99)
Glucose-Capillary: 470 mg/dL — ABNORMAL HIGH (ref 70–99)

## 2014-01-12 LAB — TROPONIN I
Troponin I: <0.3 ng/mL (ref <0.30)
Troponin I: <0.3 ng/mL (ref <0.30)

## 2014-01-12 LAB — COMPREHENSIVE METABOLIC PANEL
ALT: 95 U/L — ABNORMAL HIGH (ref 0–53)
AST: 73 U/L — ABNORMAL HIGH (ref 0–37)
Albumin: 4 g/dL (ref 3.5–5.2)
Alkaline Phosphatase: 70 U/L (ref 39–117)
Anion gap: 18 — ABNORMAL HIGH (ref 5–15)
BUN: 14 mg/dL (ref 6–23)
CALCIUM: 9.6 mg/dL (ref 8.4–10.5)
CO2: 22 meq/L (ref 19–32)
Chloride: 93 mEq/L — ABNORMAL LOW (ref 96–112)
Creatinine, Ser: 0.87 mg/dL (ref 0.50–1.35)
GFR calc non Af Amer: >90 mL/min (ref >90)
GLUCOSE: 293 mg/dL — AB (ref 70–99)
Potassium: 4.7 mEq/L (ref 3.7–5.3)
Sodium: 133 mEq/L — ABNORMAL LOW (ref 137–147)
Total Bilirubin: 0.6 mg/dL (ref 0.3–1.2)
Total Protein: 7.8 g/dL (ref 6.0–8.3)

## 2014-01-12 LAB — CREATININE, SERUM: Creatinine, Ser: 0.71 mg/dL (ref 0.50–1.35)

## 2014-01-12 LAB — I-STAT TROPONIN, ED: Troponin i, poc: 0.01 ng/mL (ref 0.00–0.08)

## 2014-01-12 LAB — CBC
HCT: 42.3 % (ref 39.0–52.0)
HEMATOCRIT: 44.8 % (ref 39.0–52.0)
HEMOGLOBIN: 15.1 g/dL (ref 13.0–17.0)
Hemoglobin: 14.2 g/dL (ref 13.0–17.0)
MCH: 28.1 pg (ref 26.0–34.0)
MCH: 28.7 pg (ref 26.0–34.0)
MCHC: 33.7 g/dL (ref 30.0–36.0)
MCHC: 33.6 g/dL (ref 30.0–36.0)
MCV: 83.3 fL (ref 78.0–100.0)
MCV: 85.6 fL (ref 78.0–100.0)
PLATELETS: 259 10*3/uL (ref 150–400)
Platelets: 265 10*3/uL (ref 150–400)
RBC: 5.38 MIL/uL (ref 4.22–5.81)
RBC: 4.94 MIL/uL (ref 4.22–5.81)
RDW: 13.1 % (ref 11.5–15.5)
RDW: 13.5 % (ref 11.5–15.5)
WBC: 12.5 10*3/uL — ABNORMAL HIGH (ref 4.0–10.5)
WBC: 12.7 10*3/uL — AB (ref 4.0–10.5)

## 2014-01-12 LAB — URINALYSIS, ROUTINE W REFLEX MICROSCOPIC
Bilirubin Urine: NEGATIVE
Glucose, UA: >1000 mg/dL — AB
HGB URINE DIPSTICK: NEGATIVE
Ketones, ur: 40 mg/dL — AB
Leukocytes, UA: NEGATIVE
Nitrite: NEGATIVE
PROTEIN: NEGATIVE mg/dL
Specific Gravity, Urine: 1.031 — ABNORMAL HIGH (ref 1.005–1.030)
UROBILINOGEN UA: 1 mg/dL (ref 0.0–1.0)
pH: 5.5 (ref 5.0–8.0)

## 2014-01-12 LAB — URINE MICROSCOPIC-ADD ON

## 2014-01-12 LAB — CK: Total CK: 185 U/L (ref 7–232)

## 2014-01-12 LAB — MONONUCLEOSIS SCREEN: Mono Screen: NEGATIVE

## 2014-01-12 LAB — I-STAT CG4 LACTIC ACID, ED: LACTIC ACID, VENOUS: 2.41 mmol/L — AB (ref 0.5–2.2)

## 2014-01-12 LAB — INFLUENZA PANEL BY PCR (TYPE A & B)
H1N1 flu by pcr: NOT DETECTED
Influenza A By PCR: NEGATIVE
Influenza B By PCR: NEGATIVE

## 2014-01-12 MED ORDER — ONDANSETRON HCL 4 MG/2ML IJ SOLN: 4.0000 mg | Freq: Four times a day (QID) | INTRAMUSCULAR | Status: DC | PRN

## 2014-01-12 MED ORDER — DOXYCYCLINE HYCLATE 100 MG PO TABS
100.0000 mg | ORAL_TABLET | Freq: Two times a day (BID) | ORAL | Status: DC
  Administered 2014-01-12 – 2014-01-13 (×2): 100 mg via ORAL
  Filled 2014-01-12 (×3): qty 1

## 2014-01-12 MED ORDER — IPRATROPIUM-ALBUTEROL 0.5-2.5 (3) MG/3ML IN SOLN
3.0000 mL | Freq: Once | RESPIRATORY_TRACT | Status: AC
  Administered 2014-01-12: 3 mL via RESPIRATORY_TRACT
  Filled 2014-01-12: qty 3

## 2014-01-12 MED ORDER — PANTOPRAZOLE SODIUM 40 MG PO TBEC
40.0000 mg | DELAYED_RELEASE_TABLET | Freq: Every day | ORAL | Status: DC
  Administered 2014-01-12 – 2014-01-13 (×2): 40 mg via ORAL
  Filled 2014-01-12 (×2): qty 1

## 2014-01-12 MED ORDER — ROSUVASTATIN CALCIUM 20 MG PO TABS
20.0000 mg | ORAL_TABLET | Freq: Every day | ORAL | Status: DC
  Administered 2014-01-12 – 2014-01-13 (×2): 20 mg via ORAL
  Filled 2014-01-12 (×2): qty 1

## 2014-01-12 MED ORDER — CILOSTAZOL 100 MG PO TABS
100.0000 mg | ORAL_TABLET | Freq: Two times a day (BID) | ORAL | Status: DC
  Administered 2014-01-12 – 2014-01-13 (×3): 100 mg via ORAL
  Filled 2014-01-12 (×4): qty 1

## 2014-01-12 MED ORDER — METHYLPREDNISOLONE SODIUM SUCC 125 MG IJ SOLR
125.0000 mg | Freq: Once | INTRAMUSCULAR | Status: AC
  Administered 2014-01-12: 125 mg via INTRAVENOUS
  Filled 2014-01-12: qty 2

## 2014-01-12 MED ORDER — SODIUM CHLORIDE 0.9 % IJ SOLN
3.0000 mL | Freq: Two times a day (BID) | INTRAMUSCULAR | Status: DC
  Administered 2014-01-12 – 2014-01-13 (×2): 3 mL via INTRAVENOUS

## 2014-01-12 MED ORDER — ASPIRIN EC 81 MG PO TBEC
81.0000 mg | DELAYED_RELEASE_TABLET | Freq: Every day | ORAL | Status: DC
  Administered 2014-01-12 – 2014-01-13 (×2): 81 mg via ORAL
  Filled 2014-01-12 (×2): qty 1

## 2014-01-12 MED ORDER — VITAMIN B-1 100 MG PO TABS
100.0000 mg | ORAL_TABLET | Freq: Every day | ORAL | Status: DC
  Administered 2014-01-12: 100 mg via ORAL
  Filled 2014-01-12 (×2): qty 1

## 2014-01-12 MED ORDER — ZOLPIDEM TARTRATE 5 MG PO TABS
5.0000 mg | ORAL_TABLET | Freq: Every day | ORAL | Status: DC
Start: 2014-01-12 — End: 2014-01-13
  Administered 2014-01-12: 5 mg via ORAL
  Filled 2014-01-12: qty 1

## 2014-01-12 MED ORDER — GABAPENTIN 100 MG PO CAPS
100.0000 mg | ORAL_CAPSULE | Freq: Three times a day (TID) | ORAL | Status: DC
  Administered 2014-01-12 – 2014-01-13 (×3): 100 mg via ORAL
  Filled 2014-01-12 (×5): qty 1

## 2014-01-12 MED ORDER — LISINOPRIL 40 MG PO TABS
40.0000 mg | ORAL_TABLET | Freq: Every day | ORAL | Status: DC
  Administered 2014-01-12 – 2014-01-13 (×2): 40 mg via ORAL
  Filled 2014-01-12 (×2): qty 1

## 2014-01-12 MED ORDER — ADULT MULTIVITAMIN W/MINERALS CH
1.0000 | ORAL_TABLET | Freq: Every day | ORAL | Status: DC
  Administered 2014-01-12: 1 via ORAL
  Filled 2014-01-12 (×2): qty 1

## 2014-01-12 MED ORDER — FOLIC ACID 1 MG PO TABS
1.0000 mg | ORAL_TABLET | Freq: Every day | ORAL | Status: DC
  Administered 2014-01-12: 1 mg via ORAL
  Filled 2014-01-12 (×2): qty 1

## 2014-01-12 MED ORDER — PREDNISONE 50 MG PO TABS
50.0000 mg | ORAL_TABLET | Freq: Every day | ORAL | Status: DC
  Administered 2014-01-13: 50 mg via ORAL
  Filled 2014-01-12: qty 1

## 2014-01-12 MED ORDER — HYDROCODONE-ACETAMINOPHEN 5-325 MG PO TABS
1.0000 | ORAL_TABLET | Freq: Four times a day (QID) | ORAL | Status: DC | PRN
  Administered 2014-01-13: 2 via ORAL
  Administered 2014-01-13: 1 via ORAL
  Filled 2014-01-12: qty 2
  Filled 2014-01-12: qty 1

## 2014-01-12 MED ORDER — LORAZEPAM 2 MG/ML IJ SOLN: 1.0000 mg | Freq: Four times a day (QID) | INTRAMUSCULAR | Status: DC | PRN

## 2014-01-12 MED ORDER — ALBUTEROL SULFATE (2.5 MG/3ML) 0.083% IN NEBU: 5.0000 mg | INHALATION_SOLUTION | RESPIRATORY_TRACT | Status: DC | PRN

## 2014-01-12 MED ORDER — IPRATROPIUM-ALBUTEROL 0.5-2.5 (3) MG/3ML IN SOLN
3.0000 mL | Freq: Four times a day (QID) | RESPIRATORY_TRACT | Status: DC
  Administered 2014-01-12 – 2014-01-13 (×3): 3 mL via RESPIRATORY_TRACT
  Filled 2014-01-12 (×3): qty 3

## 2014-01-12 MED ORDER — LORAZEPAM 1 MG PO TABS: 1.0000 mg | ORAL_TABLET | Freq: Four times a day (QID) | ORAL | Status: DC | PRN

## 2014-01-12 MED ORDER — BENZONATATE 100 MG PO CAPS
100.0000 mg | ORAL_CAPSULE | Freq: Three times a day (TID) | ORAL | Status: DC
  Administered 2014-01-12 – 2014-01-13 (×3): 100 mg via ORAL
  Filled 2014-01-12 (×5): qty 1

## 2014-01-12 MED ORDER — ACETAMINOPHEN 325 MG PO TABS: 650.0000 mg | ORAL_TABLET | Freq: Four times a day (QID) | ORAL | Status: DC | PRN

## 2014-01-12 MED ORDER — SODIUM CHLORIDE 0.9 % IV BOLUS (SEPSIS)
1000.0000 mL | Freq: Once | INTRAVENOUS | Status: AC
  Administered 2014-01-12: 1000 mL via INTRAVENOUS

## 2014-01-12 MED ORDER — HEPARIN SODIUM (PORCINE) 5000 UNIT/ML IJ SOLN
5000.0000 [IU] | Freq: Three times a day (TID) | INTRAMUSCULAR | Status: DC
  Administered 2014-01-12 – 2014-01-13 (×3): 5000 [IU] via SUBCUTANEOUS
  Filled 2014-01-12 (×5): qty 1

## 2014-01-12 MED ORDER — SODIUM CHLORIDE 0.9 % IV SOLN
INTRAVENOUS | Status: DC
  Administered 2014-01-12: 15:00:00 via INTRAVENOUS

## 2014-01-12 MED ORDER — INSULIN ASPART 100 UNIT/ML ~~LOC~~ SOLN
0.0000 [IU] | Freq: Three times a day (TID) | SUBCUTANEOUS | Status: DC
  Administered 2014-01-12: 15 [IU] via SUBCUTANEOUS
  Administered 2014-01-13: 8 [IU] via SUBCUTANEOUS

## 2014-01-12 MED ORDER — ACETAMINOPHEN 650 MG RE SUPP: 650.0000 mg | Freq: Four times a day (QID) | RECTAL | Status: DC | PRN

## 2014-01-12 MED ORDER — INSULIN ASPART 100 UNIT/ML ~~LOC~~ SOLN
15.0000 [IU] | Freq: Once | SUBCUTANEOUS | Status: AC
  Administered 2014-01-12: 15 [IU] via SUBCUTANEOUS

## 2014-01-12 MED ORDER — INSULIN ASPART 100 UNIT/ML ~~LOC~~ SOLN: 0.0000 [IU] | Freq: Every day | SUBCUTANEOUS | Status: DC

## 2014-01-12 MED ORDER — ONDANSETRON HCL 4 MG PO TABS: 4.0000 mg | ORAL_TABLET | Freq: Four times a day (QID) | ORAL | Status: DC | PRN

## 2014-01-12 MED ORDER — THIAMINE HCL 100 MG/ML IJ SOLN
100.0000 mg | Freq: Every day | INTRAMUSCULAR | Status: DC
  Filled 2014-01-12 (×2): qty 1

## 2014-01-12 NOTE — Progress Notes (Signed)
UR completed Fr[place] Cordts K. Oluwatomisin Deman, RN, BSN, [place], CCM  01/12/2014 2:52 PM

## 2014-01-12 NOTE — ED Notes (Signed)
CK & Mono to be completed with labs sent @ 7:20, lab notified

## 2014-01-12 NOTE — ED Notes (Signed)
Pt in from home via [facility], per report pt c/o generalized pain & urinary frequency onset x 3 days, denies fever, c/o chills & generalized pain, pt A&O x 4, follows commands, speaks in complete sentences, ST

## 2014-01-12 NOTE — ED Notes (Signed)
Lunch tray ordered 

## 2014-01-12 NOTE — Care Management Note (Addendum)
    Page 1 of 1   01/13/2014     3:04:36 PM CARE MANAGEMENT NOTE 01/13/2014  Patient:  [name], [name]   Account Number:  [id number]  Date Initiated:  01/12/2014  Documentation initiated by:  Mariann Laster  Subjective/Objective Assessment:   AG Metabolic acidosis - secondary to mild lactic acidosis likely from decreased PO intake/dehydration.     Action/Plan:   CM to follow for disposition needs   Anticipated DC Date:  01/14/2014   Anticipated DC Plan:  HOME/SELF CARE         Choice offered to / List presented to:             Status of service:  Completed, signed off Medicare Important Message given?  NA - LOS <3 / Initial given by admissions (If response is "NO", the following Medicare IM given date fields will be blank) Date Medicare IM given:   Medicare IM given by:   Date Additional Medicare IM given:   Additional Medicare IM given by:    Discharge Disposition:  HOME/SELF CARE  Per UR Regulation:  Reviewed for med. necessity/level of care/duration of stay  If discussed at [place] of Stay Meetings, dates discussed:    Comments:  Shayna Eblen RN, BSN, MSHL, CCM  Nurse - Case Manager,  ([facility] [place])  [phone]  01/12/2014

## 2014-01-12 NOTE — Progress Notes (Signed)
Patient was placed on contact precautions for Rule out FLU. Flu result are negative. Discontinued droplet precautions.

## 2014-01-12 NOTE — ED Notes (Signed)
Lactic acid results given to Dr. Yao 

## 2014-01-12 NOTE — H&P (Signed)
[facility] Admission History and Physical Service Pager: [phone]  Patient name: [name] Medical record number: [id number] Date of birth: 1950/08/17 Age: 63 y.o. Gender: male  Primary Care Provider: Lorayne Marek, MD Consultants: None Code Status: Full code  Chief Complaint: Cough, shortness of breath.   Assessment and Plan: [name] is a 63 y.o. male presenting with worsening cough and SOB. PMH is significant for hypertension, hyperlipidemia, history of tobacco abuse/alcohol abuse, GERD, peripheral vascular disease, uncontrolled DM 2, hepatitis C, mild intermittent asthma and vocal cord dysfunction.  Acute Dyspnea and Cough - Patient with likely viral syndrome leading to asthma exacerbation/vocal cord dysfunction given PMH and symptomatology.  Patient does have a prior echo showing diastolic dysfunction, however patient is not volume overload on exam. Chest x-ray negative for infiltrate. Patient does have a mild leukocytosis of 12.5.  Lactic acidosis of 2.41 likely secondary to component of dehydration in the setting of acute illness.   - Will admit to telemetry, attending Dr. Ree Kida - Given symptoms, will obtain Influenza PCR; Mono negative - DuoNeb Q6H/Albuterol Q2PRN - Solu-Medrol given in the ED; will start patient on prednisone tomorrow for likely component of asthma exacerbation. - Holding home Qvar, Spiriva as recent pulmonology note indicates no need for this. - Tessalon Perles 3 times a day for cough. - No indication for antibiotics at this time.   AG Metabolic acidosis - secondary to mild lactic acidosis likely from decreased PO intake/dehydration. - IV fluids - NS @ 100 mL/hr - No indication for antibiotics at this time. - Will repeat in the am.  Chest pain - Chest wall tender to palpation. Appears consistent with costochondritis secondary to recent illness/worsening cough. - Will monitor closely.  Given past medical history will not  treat with NSAIDs.  DM-2, uncontrolled - recent A1c was 8.6. - CBG's TID AC and QHS - Moderate SSI - Will need close outpatient follow up.  HTN  - Well controlled currently. - Continuing home Lisinopril.  HLD - Continuing home Crestor  PVD - Continuing home ASA and Pletal  GERD - Protonix  History of Alcohol abuse - CIWA protocol  FEN/GI: Heart healthy/Carb Modified; NS @ 100 mL/hr Prophylaxis: Heparin  Disposition: Pending clinical improvement.   History of Present Illness:  [name] is a 63 y.o. male with PMH of hypertension, hyperlipidemia, history of tobacco abuse, peripheral vascular disease, uncontrolled DM 2, hepatitis C, mild intermittent asthma and vocal cord dysfunction who presents with complaints of cough and shortness of breath.  Patient reports a 3 day history of  nonproductive cough.  He states that his cough and shortness of breath have been slowly worsening to the extent that he felt that he should be evaluated.  He has been experiencing associated chest pain, which is likely secondary to cough. He also reports frequent chills but denies fever. He does report arm and back pain.  No recent nausea, vomiting, diarrhea.  No reported sick contacts although he does use public transportation.  Of note,he was admitted for "COPD exacerbation" from 9/29 to 10/2.  Following discharge he followed up with pulmonology where pulmonary function tests were done.  PFTs did not show evidence of COPD. His studies were consistent with vocal cord dysfunction.  Pulmonologist Dr. Lake Bells did feel that he may have mild asthma.   Review Of Systems: Per HPI.  Otherwise 12 point review of systems was performed and was unremarkable.  Patient Active Problem List   Diagnosis Date Noted  .  Cramping of hands 12/21/2013  . Dyspnea 12/08/2013  . Diabetes mellitus type 2, uncontrolled 12/01/2013  . Mild intermittent asthma 08/18/2013  . Wheezing 06/26/2013  . Atherosclerosis of native  arteries of the extremities with intermittent claudication 02/11/2013  . Osteoarthritis 10/27/2012  . Hepatitis C 09/19/2012  . Former smoker 09/19/2012  . Claudication 09/19/2012  . Essential hypertension, benign 08/20/2012  . Dyslipidemia 08/20/2012  . Depression 08/20/2012  . GERD (gastroesophageal reflux disease) 08/20/2012  . PVD (peripheral vascular disease) 08/20/2012   Past Medical History: Past Medical History  Diagnosis Date  . Diabetes mellitus   . Hypertension   . Coronary artery disease   . Hepatitis C   . High cholesterol   . Arthritis   . Hepatitis   . COPD (chronic obstructive pulmonary disease)    Past Surgical History: Past Surgical History  Procedure Laterality Date  . Esophagogastroduodenoscopy  02/09/2011    Procedure: ESOPHAGOGASTRODUODENOSCOPY (EGD);  Surgeon: Missy Sabins, MD;  Location: [facility] ENDOSCOPY;  Service: Endoscopy;  Laterality: N/A;  . Joint replacement  10/22/2009    knee   Social History: History  Substance Use Topics  . Smoking status: Former Smoker -- 1.50 packs/day for 30 years    Types: Cigarettes    Quit date: 03/19/2013  . Smokeless tobacco: Never Used  . 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  Family History: Family History  Problem Relation Age of Onset  . Stroke Mother   . Diabetes Mother   . Hypertension Mother   . Hyperlipidemia Mother   . Heart disease Father   . Hypertension Father   . Heart attack Father    Allergies and Medications: Allergies  Allergen Reactions  . Mobic [Meloxicam] Rash   No current facility-administered medications on file prior to encounter.   Current Outpatient Prescriptions on File Prior to Encounter  Medication Sig Dispense Refill  . albuterol (PROVENTIL) (2.5 MG/3ML) 0.083% nebulizer solution Take 3 mLs (2.5 mg total) by nebulization every 6 (six) hours as needed for wheezing or shortness of breath. 75 mL 11  .  aspirin EC 81 MG tablet Take 1 tablet (81 mg total) by mouth daily. 30 tablet 3  . atorvastatin (LIPITOR) 10 MG tablet Take 10 mg by mouth daily at 6 PM.    . beclomethasone (QVAR) 80 MCG/ACT inhaler Inhale 2 puffs into the lungs 2 (two) times daily. 1 Inhaler 1  . cholecalciferol (VITAMIN D) 1000 UNITS tablet Take 1 tablet (1,000 Units total) by mouth daily. 30 tablet 0  . cilostazol (PLETAL) 100 MG tablet Take 100 mg by mouth 2 (two) times daily.    [name] gabapentin (NEURONTIN) 100 MG capsule Take 1 capsule (100 mg total) by mouth 3 (three) times daily. 90 capsule 1  . glipiZIDE (GLUCOTROL) 5 MG tablet Take 1 tablet (5 mg total) by mouth 2 (two) times daily before a meal. (Patient taking differently: Take 10 mg by mouth 2 (two) times daily before a meal. ) 60 tablet 3  . lisinopril (PRINIVIL,ZESTRIL) 40 MG tablet Take 1 tablet (40 mg total) by mouth daily. 30 tablet 3  . metFORMIN (GLUCOPHAGE) 1000 MG tablet Take 1 tablet (1,000 mg total) by mouth 2 (two) times daily with a meal. 180 tablet 3  . omeprazole (PRILOSEC) 40 MG capsule Take 1 capsule (40 mg total) by mouth daily. 30 capsule 3  . rosuvastatin (CRESTOR) 20 MG tablet  Take 1 tablet (20 mg total) by mouth daily. 90 tablet 3  . zolpidem (AMBIEN) 5 MG tablet Take 1 tablet (5 mg total) by mouth at bedtime. 15 tablet 0  . hydrOXYzine (ATARAX/VISTARIL) 25 MG tablet Take 25 mg by mouth every 8 (eight) hours as needed for anxiety.    [name] tiotropium (SPIRIVA) 18 MCG inhalation capsule Place 1 capsule (18 mcg total) into inhaler and inhale daily. (Patient not taking: Reported on 01/12/2014) 30 capsule 12    Objective: BP 144/86 mmHg  Pulse 118  Temp(Src) 100.1 F (37.8 C) (Oral)  Resp 16  SpO2 99% Exam: General: obese African-American male, on supplemental oxygen, no acute distress. HEENT: NCAT. MMM. No scleral icterus. Cardiovascular: Tachycardic. Regular rhythm. No murmurs. Chest wall tender to palpation. Respiratory: diffuse wheezing  throughout all lung fields. Abdomen: soft, nontender, nondistended. No rebound or guarding. No palpable organomegaly. Extremities: no lower extremity edema. Skin: warm, dry, intact. Neuro: alert and oriented 3. No focal deficits.  Labs and Imaging: CBC BMET   Recent Labs Lab 01/12/14 0720  WBC 12.5*  HGB 15.1  HCT 44.8  PLT 265    Recent Labs Lab 01/12/14 0720  NA 133*  K 4.7  CL 93*  CO2 22  BUN 14  CREATININE 0.87  GLUCOSE 293*  CALCIUM 9.6      Dg Chest 2 View 01/12/2014   IMPRESSION: No edema or consolidation.     Echo - 12/10/2013 Study Conclusions - Left ventricle: There was moderate focal basal hypertrophy.  Systolic function was normal. The estimated ejection fraction was in the range of 60% to 65%. There was an increased relative contribution of atrial contraction to ventricular filling. Doppler parameters are consistent with abnormal left ventricular relaxation (grade 1 diastolic dysfunction). - Aortic valve: Mildly to moderately calcified annulus. Mildly calcified leaflets. - Tricuspid valve: There was mild regurgitation.  EKG - Sinus tachycardia. Left atrium abnormality. No ST or T-wave changes. Negative for ischemia.  Mono - Neg.  POC Troponin - 0.01.   Lactic Acid - 2.41  Coral Spikes, DO 01/12/2014, 10:58 AM PGY-3, [place] [facility] pager: [phone], text pages welcome

## 2014-01-12 NOTE — Progress Notes (Signed)
Blood sugar 401. Paged Resident Thersa Salt. Was instructed to give 15 units of insulin.

## 2014-01-12 NOTE — Progress Notes (Signed)
CBG 470. MD notified for nighttime SSI orders. Ordered to give 15 units Novolog. Will continue to monitor. Ronnette Hila, RN

## 2014-01-12 NOTE — ED Provider Notes (Signed)
CSN: [id number]     Arrival date & time 01/12/14  0701 History   First MD Initiated Contact with Patient 01/12/14 [phone]     Chief Complaint  Patient presents with  . Muscle Pain  . Urinary Frequency     (Consider location/radiation/quality/duration/timing/severity/associated sxs/prior Treatment) The history is provided by the patient.  LINK BURGESON is a 63 y.o. male hx of DM, HTN, CAD, recent COPD versus asthma exacerbation here presenting with cough and generalized pain and urinary frequency and chills. Symptoms have been going on for the last 5 days. Has been having productive cough as well. Has some subjective chills worse since yesterday. Has been urinating much more often especially since last night. Denies running fevers. Denies any abdominal pain or nausea or vomiting or diarrhea. Have flu shot and pneumonia shot last month when he was in the hospital.    Past Medical History  Diagnosis Date  . Diabetes mellitus   . Hypertension   . Coronary artery disease   . Hepatitis C   . High cholesterol   . Arthritis   . Hepatitis   . COPD (chronic obstructive pulmonary disease)    Past Surgical History  Procedure Laterality Date  . Esophagogastroduodenoscopy  02/09/2011    Procedure: ESOPHAGOGASTRODUODENOSCOPY (EGD);  Surgeon: Missy Sabins, MD;  Location: [facility] ENDOSCOPY;  Service: Endoscopy;  Laterality: N/A;  . Joint replacement  10/22/2009    knee   Family History  Problem Relation Age of Onset  . Stroke Mother   . Diabetes Mother   . Hypertension Mother   . Hyperlipidemia Mother   . Heart disease Father   . Hypertension Father   . Heart attack Father    History  Substance Use Topics  . Smoking status: Former Smoker -- 1.50 packs/day for 30 years    Types: Cigarettes    Quit date: 03/19/2013  . Smokeless tobacco: Never Used  . 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  Respiratory:  Positive for cough.   Genitourinary: Positive for frequency.  All other systems reviewed and are negative.     Allergies  Mobic  Home Medications   Prior to Admission medications   Medication Sig Start Date End Date Taking? Authorizing Provider  albuterol (PROVENTIL) (2.5 MG/3ML) 0.083% nebulizer solution Take 3 mLs (2.5 mg total) by nebulization every 6 (six) hours as needed for wheezing or shortness of breath. 12/04/13  Yes Kinnie Feil, MD  aspirin EC 81 MG tablet Take 1 tablet (81 mg total) by mouth daily. 08/31/13  Yes Lorayne Marek, MD  atorvastatin (LIPITOR) 10 MG tablet Take 10 mg by mouth daily at 6 PM.   Yes Historical Provider, MD  beclomethasone (QVAR) 80 MCG/ACT inhaler Inhale 2 puffs into the lungs 2 (two) times daily. 12/04/13  Yes Kinnie Feil, MD  cholecalciferol (VITAMIN D) 1000 UNITS tablet Take 1 tablet (1,000 Units total) by mouth daily. 08/20/12  Yes Robbie Lis, MD  cilostazol (PLETAL) 100 MG tablet Take 100 mg by mouth 2 (two) times daily.   Yes Historical Provider, MD  gabapentin (NEURONTIN) 100 MG capsule Take 1 capsule (100 mg total) by mouth 3 (three) times daily. 12/04/13  Yes Kinnie Feil, MD  glipiZIDE (GLUCOTROL) 5 MG tablet Take 1 tablet (5 mg total) by mouth 2 (two) times daily before a meal. Patient taking differently: Take 10 mg by mouth  2 (two) times daily before a meal.  12/04/13  Yes Kinnie Feil, MD  lisinopril (PRINIVIL,ZESTRIL) 40 MG tablet Take 1 tablet (40 mg total) by mouth daily. 08/31/13  Yes Lorayne Marek, MD  metFORMIN (GLUCOPHAGE) 1000 MG tablet Take 1 tablet (1,000 mg total) by mouth 2 (two) times daily with a meal. 12/04/13  Yes Kinnie Feil, MD  omeprazole (PRILOSEC) 40 MG capsule Take 1 capsule (40 mg total) by mouth daily. 08/31/13  Yes Lorayne Marek, MD  rosuvastatin (CRESTOR) 20 MG tablet Take 1 tablet (20 mg total) by mouth daily. 06/26/13  Yes Deepak Advani, MD  zolpidem (AMBIEN) 5 MG tablet Take 1 tablet (5 mg total)  by mouth at bedtime. 12/04/13  Yes Kinnie Feil, MD  hydrOXYzine (ATARAX/VISTARIL) 25 MG tablet Take 25 mg by mouth every 8 (eight) hours as needed for anxiety.    Historical Provider, MD  tiotropium (SPIRIVA) 18 MCG inhalation capsule Place 1 capsule (18 mcg total) into inhaler and inhale daily. Patient not taking: Reported on 01/12/2014 12/04/13   Kinnie Feil, MD   BP 132/77 mmHg  Pulse 110  Temp(Src) 100.1 F (37.8 C) (Oral)  Resp 15  SpO2 98% Physical Exam  Constitutional: He is oriented to person, place, and time.  Uncomfortable, coughing   HENT:  Head: Normocephalic.  Mouth/Throat: Oropharynx is clear and moist.  Eyes: Conjunctivae are normal. Pupils are equal, round, and reactive to light.  Neck: Normal range of motion. Neck supple.  Cardiovascular: Regular rhythm and normal heart sounds.   tachy  Pulmonary/Chest:  Tachypneic, + diffuse wheezing, mild rhonchi throughout.   Abdominal: Soft. Bowel sounds are normal. He exhibits no distension. There is no tenderness. There is no rebound and no guarding.  Musculoskeletal: Normal range of motion. He exhibits no edema or tenderness.  Neurological: He is alert and oriented to person, place, and time. No cranial nerve deficit. Coordination normal.  Skin: Skin is warm and dry.  Psychiatric: He has a normal mood and affect. His behavior is normal. Judgment and thought content normal.  Nursing note and vitals reviewed.   ED Course  Procedures (including critical care time) Labs Review Labs Reviewed  CBC - Abnormal; Notable for the following:    WBC 12.5 (*)    All other components within normal limits  COMPREHENSIVE METABOLIC PANEL - Abnormal; Notable for the following:    Sodium 133 (*)    Chloride 93 (*)    Glucose, Bld 293 (*)    AST 73 (*)    ALT 95 (*)    Anion gap 18 (*)    All other components within normal limits  URINALYSIS, ROUTINE W REFLEX MICROSCOPIC - Abnormal; Notable for the following:    Specific  Gravity, Urine 1.031 (*)    Glucose, UA >1000 (*)    Ketones, ur 40 (*)    All other components within normal limits  URINE MICROSCOPIC-ADD ON - Abnormal; Notable for the following:    Bacteria, UA FEW (*)    All other components within normal limits  I-STAT CG4 LACTIC ACID, ED - Abnormal; Notable for the following:    Lactic Acid, Venous 2.41 (*)    All other components within normal limits  URINE CULTURE  MONONUCLEOSIS SCREEN  CK  I-STAT TROPOININ, ED    Imaging Review Dg Chest 2 View  01/12/2014   CLINICAL DATA:  Cough and difficulty breathing  EXAM: CHEST  2 VIEW  COMPARISON:  December 01, 2013  FINDINGS:  There is no edema or consolidation. Heart size and pulmonary vascularity are normal. No adenopathy. No bone lesions.  IMPRESSION: No edema or consolidation.   Electronically Signed   By: Lowella Grip M.D.   On: 01/12/2014 08:31     EKG Interpretation   Date/Time:  Tuesday January 12 2014 07:07:25 EST Ventricular Rate:  135 PR Interval:  130 QRS Duration: 74 QT Interval:  286 QTC Calculation: 429 R Axis:   70 Text Interpretation:  Sinus tachycardia Atrial premature complex Since  last tracing rate faster Confirmed by Maryhelen Lindler  MD, Kynzli Rease ([id number]) on 01/12/2014  7:16:10 AM      MDM   Final diagnoses:  Cough    JOSPH NORFLEET is a 63 y.o. male here with cough, myalgias, urinary frequency. Consider UTI vs bronchitis vs pneumonia. Will get labs, lactate, UA, CXR. Will give nebs, steroids. Will likely need admission.   10:12 AM CXR and UA clear. Lactate mildly elevated, WBC 12. I think likely COPD exacerbation. Still tachypneic after nebs and steroids. Never hypoxic. Will admit for COPD.   Wandra Arthurs, MD 01/12/14 [phone]

## 2014-01-13 LAB — BASIC METABOLIC PANEL
Anion gap: 13 (ref 5–15)
BUN: 22 mg/dL (ref 6–23)
CO2: 23 meq/L (ref 19–32)
Calcium: 8.3 mg/dL — ABNORMAL LOW (ref 8.4–10.5)
Chloride: 97 mEq/L (ref 96–112)
Creatinine, Ser: 0.79 mg/dL (ref 0.50–1.35)
GFR calc Af Amer: >90 mL/min (ref >90)
GLUCOSE: 362 mg/dL — AB (ref 70–99)
POTASSIUM: 4.6 meq/L (ref 3.7–5.3)
SODIUM: 133 meq/L — AB (ref 137–147)

## 2014-01-13 LAB — TROPONIN I: Troponin I: <0.3 ng/mL (ref <0.30)

## 2014-01-13 LAB — GLUCOSE, CAPILLARY
GLUCOSE-CAPILLARY: 276 mg/dL — AB (ref 70–99)
Glucose-Capillary: 287 mg/dL — ABNORMAL HIGH (ref 70–99)

## 2014-01-13 LAB — CBC
HEMATOCRIT: 35 % — AB (ref 39.0–52.0)
Hemoglobin: 11.7 g/dL — ABNORMAL LOW (ref 13.0–17.0)
MCH: 28.5 pg (ref 26.0–34.0)
MCHC: 33.4 g/dL (ref 30.0–36.0)
MCV: 85.2 fL (ref 78.0–100.0)
PLATELETS: 231 10*3/uL (ref 150–400)
RBC: 4.11 MIL/uL — ABNORMAL LOW (ref 4.22–5.81)
RDW: 13.4 % (ref 11.5–15.5)
WBC: 9.6 10*3/uL (ref 4.0–10.5)

## 2014-01-13 LAB — LACTIC ACID, PLASMA: Lactic Acid, Venous: 2.3 mmol/L — ABNORMAL HIGH (ref 0.5–2.2)

## 2014-01-13 MED ORDER — TRAZODONE HCL 50 MG PO TABS
50.0000 mg | ORAL_TABLET | Freq: Every evening | ORAL | Status: DC | PRN
  Filled 2014-01-13: qty 1

## 2014-01-13 MED ORDER — GLIPIZIDE 10 MG PO TABS
10.0000 mg | ORAL_TABLET | Freq: Two times a day (BID) | ORAL | Status: DC
  Filled 2014-01-13 (×2): qty 1

## 2014-01-13 MED ORDER — DOXYCYCLINE HYCLATE 100 MG PO TABS: 100.0000 mg | ORAL_TABLET | Freq: Two times a day (BID) | ORAL | Status: DC

## 2014-01-13 MED ORDER — TRAZODONE HCL 50 MG PO TABS: 50.0000 mg | ORAL_TABLET | Freq: Every evening | ORAL | Status: DC | PRN

## 2014-01-13 MED ORDER — PREDNISONE 50 MG PO TABS: 50.0000 mg | ORAL_TABLET | Freq: Every day | ORAL | Status: AC

## 2014-01-13 MED ORDER — METFORMIN HCL 500 MG PO TABS
1000.0000 mg | ORAL_TABLET | Freq: Two times a day (BID) | ORAL | Status: DC
  Filled 2014-01-13 (×2): qty 2

## 2014-01-13 NOTE — Discharge Instructions (Signed)
Mr. Bulman,   It was great to meet you! Be sure to finish your prednisone (3 days left, starting tomorrow), antibiotics (6 days left), and take albuterol as needed. If your sugars are consistently above 250 call your doctors office.   At home be sure to continue your QVar and regular diabetic meds (metformin and glipizide)  Dr. Wendi Snipes  Asthma Asthma is a condition of the lungs in which the airways tighten and narrow. Asthma can make it hard to breathe. Asthma cannot be cured, but medicine and lifestyle changes can help control it. Asthma may be started (triggered) by:  Animal skin flakes (dander).  Dust.  Cockroaches.  Pollen.  Mold.  Smoke.  Cleaning products.  Hair sprays or aerosol sprays.  Paint fumes or strong smells.  Cold air, weather changes, and winds.  Crying or laughing hard.  Stress.  Certain medicines or drugs.  Foods, such as dried fruit, potato chips, and sparkling grape juice.  Infections or conditions (colds, flu).  Exercise.  Certain medical conditions or diseases.  Exercise or tiring activities. HOME CARE   Take medicine as told by your doctor.  Use a peak flow meter as told by your doctor. A peak flow meter is a tool that measures how well the lungs are working.  Record and keep track of the peak flow meter's readings.  Understand and use the asthma action plan. An asthma action plan is a written plan for taking care of your asthma and treating your attacks.  To help prevent asthma attacks:  Do not smoke. Stay away from secondhand smoke.  Change your heating and air conditioning filter often.  Limit your use of fireplaces and wood stoves.  Get rid of pests (such as roaches and mice) and their droppings.  Throw away plants if you see mold on them.  Clean your floors. Dust regularly. Use cleaning products that do not smell.  Have someone vacuum when you are not home. Use a vacuum cleaner with a HEPA filter if possible.  Replace  carpet with wood, tile, or vinyl flooring. Carpet can trap animal skin flakes and dust.  Use allergy-proof pillows, mattress covers, and box spring covers.  Wash bed sheets and blankets every week in hot water and dry them in a dryer.  Use blankets that are made of polyester or cotton.  Clean bathrooms and kitchens with bleach. If possible, have someone repaint the walls in these rooms with mold-resistant paint. Keep out of the rooms that are being cleaned and painted.  Wash hands often. GET HELP IF:  You have make a whistling sound when breaking (wheeze), have shortness of breath, or have a cough even if taking medicine to prevent attacks.  The colored mucus you cough up (sputum) is thicker than usual.  The colored mucus you cough up changes from clear or white to yellow, green, gray, or bloody.  You have problems from the medicine you are taking such as:  A rash.  Itching.  Swelling.  Trouble breathing.  You need reliever medicines more than 2-3 times a week.  Your peak flow measurement is still at 50-79% of your personal best after following the action plan for 1 hour.  You have a fever. GET HELP RIGHT AWAY IF:   You seem to be worse and are not responding to medicine during an asthma attack.  You are short of breath even at rest.  You get short of breath when doing very little activity.  You have trouble eating, drinking,  or talking.  You have chest pain.  You have a fast heartbeat.  Your lips or fingernails start to turn blue.  You are light-headed, dizzy, or faint.  Your peak flow is less than 50% of your personal best. MAKE SURE YOU:   Understand these instructions.  Will watch your condition.  Will get help right away if you are not doing well or get worse. Document Released: 08/08/2007 Document Revised: 07/06/2013 Document Reviewed: 09/18/2012 [facility] Patient Information 2015 [place], [place]. This information is not intended to replace advice  given to you by your health care provider. Make sure you discuss any questions you have with your health care provider.

## 2014-01-13 NOTE — Discharge Summary (Signed)
[facility] Discharge Summary  Patient name: [name] Medical record number: [id number] Date of birth: [birth year] Age: 63 y.o. Gender: male Date of Admission: 01/12/2014  Date of Discharge: 01/13/14 Admitting Physician: Lupita Dawn, MD  Primary Care Provider: Lorayne Marek, MD Consultants: none  Indication for Hospitalization: Asthma/copd exacerbation  Discharge Diagnoses/Problem List:  - asthma and copd - costochondritis - uncontrolled type 2 DM - htn - hyperlipidemia - PVD - GERD - hx of alcohol abuse  Disposition: to home  Discharge Condition: stable  Discharge Exam: please see progress note from day of discharge  Brief Hospital Course:  [name] is a 63 y.o. male who presented with worsening cough and SOB concerning for an asthma/COPD exacerbation. PMH is significant for hypertension, hyperlipidemia, history of tobacco abuse/alcohol abuse, GERD, peripheral vascular disease, uncontrolled DM 2, hepatitis C, mild intermittent asthma and vocal cord dysfunction, and 45-pk-yr smoking history.  Influenza PCR and MonoSpot negative here. Cough and SOB felt most likely to be a COPD exacerbation, with possible asthma component as well. He was treated as a COPD exacerbation with DuoNebs, steroids, and po Doxycycline and was improved the following day with a normal white count, afebrile, normal oxygen saturation, improved air movement on exam, and symptomatic improvement.   Issues for Follow Up:  - Uncontrolled type 2 diabetes mellitus: did not start on insulin as patient was very hesitant to do so. Glucoses here elevated (276 - 489), clearly exacerbated by glucocorticoids. Once off steroids if glucose continues to be elevated, recommend starting low dose basal insulin, especially given A1c of 8.6 most recently. Home meds were re-started on dc.   Significant Procedures: none  Significant Labs and Imaging:   Recent Labs Lab 01/12/14 0720  01/12/14 1340 01/13/14 0114  WBC 12.5* 12.7* 9.6  HGB 15.1 14.2 11.7*  HCT 44.8 42.3 35.0*  PLT 265 259 231    Recent Labs Lab 01/12/14 0720 01/12/14 1340 01/13/14 0114  NA 133*  --  133*  K 4.7  --  4.6  CL 93*  --  97  CO2 22  --  23  GLUCOSE 293*  --  362*  BUN 14  --  22  CREATININE 0.87 0.71 0.79  CALCIUM 9.6  --  8.3*  ALKPHOS 70  --   --   AST 73*  --   --   ALT 95*  --   --   ALBUMIN 4.0  --   --    CBG (last 3)   Recent Labs  01/12/14 2157 01/13/14 0618 01/13/14 1138  GLUCAP 470* 287* 276*    Mono - Neg.  Influenza - neg.  POC Troponin - 0.01.   Lactic Acid - 2.3  Dg Chest 2 View 01/12/2014 IMPRESSION: No edema or consolidation.   Echo - 12/10/2013 Study Conclusions - Left ventricle: There was moderate focal basal hypertrophy. Systolic function was normal. The estimated ejection fraction was in the range of 60% to 65%. There was an increased relative contribution of atrial contraction to ventricular filling. Doppler parameters are consistent with abnormal left ventricular relaxation (grade 1 diastolic dysfunction). - Aortic valve: Mildly to moderately calcified annulus. Mildly calcified leaflets. - Tricuspid valve: There was mild regurgitation.  EKG - Sinus tachycardia. Left atrium abnormality. No ST or T-wave changes. Negative for ischemia.  Results/Tests Pending at Time of Discharge: none  Discharge Medications:    Medication List    STOP taking these medications  tiotropium 18 MCG inhalation capsule  Commonly known as:  SPIRIVA     zolpidem 5 MG tablet  Commonly known as:  AMBIEN      TAKE these medications        albuterol (2.5 MG/3ML) 0.083% nebulizer solution  Commonly known as:  PROVENTIL  Take 3 mLs (2.5 mg total) by nebulization every 6 (six) hours as needed for wheezing or shortness of breath.     aspirin EC 81 MG tablet  Take 1 tablet (81 mg total) by mouth daily.     atorvastatin 10 MG tablet  Commonly  known as:  LIPITOR  Take 10 mg by mouth daily at 6 PM.     beclomethasone 80 MCG/ACT inhaler  Commonly known as:  QVAR  Inhale 2 puffs into the lungs 2 (two) times daily.     cholecalciferol 1000 UNITS tablet  Commonly known as:  VITAMIN D  Take 1 tablet (1,000 Units total) by mouth daily.     cilostazol 100 MG tablet  Commonly known as:  PLETAL  Take 100 mg by mouth 2 (two) times daily.     doxycycline 100 MG tablet  Commonly known as:  VIBRA-TABS  Take 1 tablet (100 mg total) by mouth every 12 (twelve) hours.     gabapentin 100 MG capsule  Commonly known as:  NEURONTIN  Take 1 capsule (100 mg total) by mouth 3 (three) times daily.     glipiZIDE 5 MG tablet  Commonly known as:  GLUCOTROL  Take 1 tablet (5 mg total) by mouth 2 (two) times daily before a meal.     hydrOXYzine 25 MG tablet  Commonly known as:  ATARAX/VISTARIL  Take 25 mg by mouth every 8 (eight) hours as needed for anxiety.     lisinopril 40 MG tablet  Commonly known as:  PRINIVIL,ZESTRIL  Take 1 tablet (40 mg total) by mouth daily.     metFORMIN 1000 MG tablet  Commonly known as:  GLUCOPHAGE  Take 1 tablet (1,000 mg total) by mouth 2 (two) times daily with a meal.     omeprazole 40 MG capsule  Commonly known as:  PRILOSEC  Take 1 capsule (40 mg total) by mouth daily.     predniSONE 50 MG tablet  Commonly known as:  DELTASONE  Take 1 tablet (50 mg total) by mouth daily with breakfast.     rosuvastatin 20 MG tablet  Commonly known as:  CRESTOR  Take 1 tablet (20 mg total) by mouth daily.     traZODone 50 MG tablet  Commonly known as:  DESYREL  Take 1 tablet (50 mg total) by mouth at bedtime as needed for sleep.        Discharge Instructions: Please refer to Patient Instructions section of EMR for full details.  Patient was counseled important signs and symptoms that should prompt return to medical care, changes in medications, dietary instructions, activity restrictions, and follow up  appointments.   Follow-Up Appointments: Follow-up Information    Follow up with Lorayne Marek, MD On 01/20/2014.   Specialty:  Internal Medicine   Why:  for hospital follow up. Arrive at 4:30pm.   Contact information:   [place] [address] [phone]       Timmothy Euler, MD 01/14/2014, 9:00 AM [place], PGY-3

## 2014-01-13 NOTE — Progress Notes (Signed)
D/C'd tele; D/C'd IV; D/C'd pt.  Explained discharge instructions to pt, and he had no further questions.  Pt in no acute distress.

## 2014-01-13 NOTE — Progress Notes (Addendum)
Inpatient Diabetes Program Recommendations  AACE/ADA: New Consensus Statement on Inpatient Glycemic Control (2013)  Target Ranges:  Prepandial:   less than 140 mg/dL      Peak postprandial:   less than 180 mg/dL (1-2 hours)      Critically ill patients:  140 - 180 mg/dL   Results for [name], [name] (MRN [id number]) as of 01/13/2014 07:36  Ref. Range 01/12/2014 18:12 01/12/2014 21:55 01/12/2014 21:57 01/13/2014 06:18  Glucose-Capillary Latest Range: 70-99 mg/dL 401 (H) 489 (H) 470 (H) 287 (H)   Reason for Assessment: elevated CBG  Diabetes history: Type 2 Outpatient Diabetes medications: Glipizide 10mg  bid, Glucophage 1000mg  bid Current orders for Inpatient glycemic control: Novolog senstive 0-15 tid with meals and Novolog sensitive at hs   Based on current blood sugars, please consider adding basal insulin, Lantus 10-15 units qday, Novolog 10 units at meals (hold if patient eats less than 50%) and increase Novolog correction scale to moderate tid with meals and hs.   Gentry Fitz, RN, BA, MHA, CDE Diabetes Coordinator Inpatient Diabetes Program  [phone] (Team Pager) [phone] [facility]) 01/13/2014 7:43 AM   01/13/14- based on A1C of 8.6%- please consider sending the patient home on low dose basal insulin.    Gentry Fitz, RN, BA, MHA, CDE Diabetes Coordinator Inpatient Diabetes Program  [phone] (Team Pager) [phone] [facility]) 01/13/2014 7:47 AM

## 2014-01-13 NOTE — Progress Notes (Signed)
Family Medicine Teaching Service Daily Progress Note Intern Pager: [phone]  Patient name: NAETHAN BRACEWELL Medical record number: [id number] Date of birth: 08/23/1950 Age: 63 y.o. Gender: male  Primary Care Provider: Lorayne Marek, MD Consultants: none Code Status: FULL  Pt Overview and Major Events to Date:  01/12/14: admitted with likely COPD exacerbation, started on Doxy  Assessment and Plan: YAHSHUA THIBAULT is a 63 y.o. male presenting with worsening cough and SOB. PMH is significant for hypertension, hyperlipidemia, history of tobacco abuse/alcohol abuse, GERD, peripheral vascular disease, uncontrolled DM 2, hepatitis C, mild intermittent asthma and vocal cord dysfunction.  # Asthma/COPD exacerbation: Improving. Patient with 45 pack year smoking history presenting with worsening cough and sob. Despite normal PFTs recently, suspect component of COPD in this patient given his smoking history. Ruled out influenza. - Influenza negative, mono negative - Doxycycline 199m BID for copd exacerbation - duo nebs q6hrs - albuterol nebs q2hrs prn - tessalon perles TID for cough - prednisone burst 565mdaily x5days for asthma/copd exacerbation  #Anion gap metabolic acidosis: resolved. Most likely due to his elevated Lactic acid in setting of acute illness and decreased po intake.  - No gap currently. Lactic acid down from 2.41 to 2.3 today. - fluids NS @ 100/hour  # Chest pain: improving.  Most likely MSK from worsening cough and given TTP on exam. ACS ruled out with troponins negative x3 and unchanged EKG from prior. - Tylenol prn for pain given allergy to mobic (holding off on all nsaids)  # Uncontrolled type 2 DM: Recent A1c was 8.6. CBGs 287- 489 here. Continuing home glipizide. Held metformin in setting of metabolic acidosis, restart today given resolution of AG met acidosis.  - CBGs TID AC and QHS - Moderate SSI - received 15 units novolog with dinner and at bedtime yesterday--> patient  not wanting to start insulin at home, feel he is safe to go home without it. Will get prompt f/u with PCP  # HTN: controlled on home Lisinopril  #HLD: continuing home Crestor  #PVD: continuing home ASA and Pletal  #GERD: Protonix  #Hx of alcohol abuse: CIWA protocol - has not required any lorazepam  FEN/GI: Heart healthy, carb modified diet, NS at 100/hr PPx: subq heparin  Disposition: to home likely today   Subjective:  Feeling better this morning, but not back to "100%". Body aches better, coughing improved.  Objective: Temp:  [98.1 F (36.7 C)-100.1 F (37.8 C)] 98.1 F (36.7 C) (11/11 0502) Pulse Rate:  [98-123] 99 (11/11 0502) Resp:  [15-37] 20 (11/11 0502) BP: (111-148)/(60-89) 120/60 mmHg (11/11 0502) SpO2:  [96 %-99 %] 96 % (11/11 0805) FiO2 (%):  [27 %-45 %] 45 % (11/10 1014) Weight:  [169 lb 12.1 oz (77 kg)-171 lb 1.6 oz (77.61 kg)] 169 lb 12.1 oz (77 kg) (11/11 0502) Physical Exam: General: NAD, sitting up in bed reading newspaper Cardiovascular: RRR, normal S1 and S2, no murmurs heard Respiratory: slightly diminished air movement throughout all lung fields but improved from yesterday; expiratory wheezes heard diffusely in all fields, normal wob Abdomen: NABS, soft, nontender to palpation, no organomegaly Extremities: trace edema, 2+ DP pulses bilaterally  Laboratory:  Recent Labs Lab 01/12/14 0720 01/12/14 1340 01/13/14 0114  WBC 12.5* 12.7* 9.6  HGB 15.1 14.2 11.7*  HCT 44.8 42.3 35.0*  PLT 265 259 231    Recent Labs Lab 01/12/14 0720 01/12/14 1340 01/13/14 0114  NA 133*  --  133*  K 4.7  --  4.6  CL 93*  --  97  CO2 22  --  23  BUN 14  --  22  CREATININE 0.87 0.71 0.79  CALCIUM 9.6  --  8.3*  PROT 7.8  --   --   BILITOT 0.6  --   --   ALKPHOS 70  --   --   ALT 95*  --   --   AST 73*  --   --   GLUCOSE 293*  --  362*   Mono - Neg.  Influenza - neg.  POC Troponin - 0.01.   Lactic Acid - 2.3  Imaging/Diagnostic Tests: Dg Chest  2 View 01/12/2014 IMPRESSION: No edema or consolidation.   Echo - 12/10/2013 Study Conclusions - Left ventricle: There was moderate focal basal hypertrophy. Systolic function was normal. The estimated ejection fraction was in the range of 60% to 65%. There was an increased relative contribution of atrial contraction to ventricular filling. Doppler parameters are consistent with abnormal left ventricular relaxation (grade 1 diastolic dysfunction). - Aortic valve: Mildly to moderately calcified annulus. Mildly calcified leaflets. - Tricuspid valve: There was mild regurgitation.  EKG - Sinus tachycardia. Left atrium abnormality. No ST or T-wave changes. Negative for ischemia.   Montel Clock, Med Student 01/13/2014, 8:08 AM [facility] Intern pager: [phone], text pages welcome   I have seen and examined Mr. Sid Falcon with student Dr. Carolynn Comment and I agree with her documentation above. My edits are in blue and my summary is below.   S: Feeling better but still with cough causing chest pain. Dyspnea is improved.   O: Filed Vitals:   01/13/14 0502  BP: 120/60  Pulse: 99  Temp: 98.1 F (36.7 C)  Resp: 20   Gen: NAD, alert, cooperative with exam HEENT: NCAT CV: RRR, good S1/S2, no murmur Resp: non labored, speaks in full sentences, diffuse exp wheezes, air movement decreased overall but improved from exam 1 day ago, R side with slightly less air movement comapred to L.  Abd: SNTND, BS present, no guarding or organomegaly Ext: Thin, no edema Neuro: Alert and oriented, No gross deficits   A/P: Mr. Purdy is a 63 y/o male here with asthma/laryngospasm exacerbation. He is improved on albuterol, steroids, and antibiotics. We are using antibiotics here to treat emperically for possible infectious proces leading to exacerbation and concern for interval develop,ent of PNA.   His CBGs are uncontrolled but he is resistant to starting LAntus. I have restarted his home meds  and advised him to have close follow up with his PCP. I ahave also told him he will likely need insulin in the near future. Currently they are likely exacerbated by systemic steroids.   Likely DC today.   Laroy Apple, MD [facility] Resident, PGY-3 01/13/2014, 10:00 AM

## 2014-01-14 ENCOUNTER — Telehealth: Payer: Self-pay | Admitting: Emergency Medicine

## 2014-01-14 NOTE — Telephone Encounter (Signed)
Pt called in to clarify Glipizide instructions. States he was recently discharged from hospital s/p COPD exacerbation Discharged on Prednisone with high blood sugar reading Pt informed to continue taking Glipizide 5 mg tab BID as prescribed by PCP last visit and return next week f/u Walk in hours provided if needed

## 2014-01-20 ENCOUNTER — Encounter: Payer: Self-pay | Admitting: Vascular Surgery

## 2014-01-20 ENCOUNTER — Encounter: Payer: Self-pay | Admitting: Internal Medicine

## 2014-01-20 ENCOUNTER — Ambulatory Visit: Payer: Medicare HMO | Attending: Internal Medicine | Admitting: Internal Medicine

## 2014-01-20 VITALS — BP 119/81 | HR 98 | Temp 98.5°F | Resp 16 | Wt 164.8 lb

## 2014-01-20 DIAGNOSIS — Z7982 Long term (current) use of aspirin: Secondary | ICD-10-CM | POA: Diagnosis not present

## 2014-01-20 DIAGNOSIS — R062 Wheezing: Secondary | ICD-10-CM

## 2014-01-20 DIAGNOSIS — Z96659 Presence of unspecified artificial knee joint: Secondary | ICD-10-CM | POA: Insufficient documentation

## 2014-01-20 DIAGNOSIS — E780 Pure hypercholesterolemia: Secondary | ICD-10-CM | POA: Insufficient documentation

## 2014-01-20 DIAGNOSIS — Z79899 Other long term (current) drug therapy: Secondary | ICD-10-CM | POA: Diagnosis not present

## 2014-01-20 DIAGNOSIS — B1920 Unspecified viral hepatitis C without hepatic coma: Secondary | ICD-10-CM | POA: Diagnosis not present

## 2014-01-20 DIAGNOSIS — J441 Chronic obstructive pulmonary disease with (acute) exacerbation: Secondary | ICD-10-CM | POA: Insufficient documentation

## 2014-01-20 DIAGNOSIS — R05 Cough: Secondary | ICD-10-CM

## 2014-01-20 DIAGNOSIS — E139 Other specified diabetes mellitus without complications: Secondary | ICD-10-CM

## 2014-01-20 DIAGNOSIS — E119 Type 2 diabetes mellitus without complications: Secondary | ICD-10-CM | POA: Insufficient documentation

## 2014-01-20 DIAGNOSIS — I10 Essential (primary) hypertension: Secondary | ICD-10-CM | POA: Insufficient documentation

## 2014-01-20 DIAGNOSIS — Z87891 Personal history of nicotine dependence: Secondary | ICD-10-CM | POA: Insufficient documentation

## 2014-01-20 DIAGNOSIS — R059 Cough, unspecified: Secondary | ICD-10-CM

## 2014-01-20 LAB — GLUCOSE, POCT (MANUAL RESULT ENTRY): POC Glucose: 238 mg/dl — AB (ref 70–99)

## 2014-01-20 MED ORDER — IPRATROPIUM-ALBUTEROL 0.5-2.5 (3) MG/3ML IN SOLN
3.0000 mL | Freq: Once | RESPIRATORY_TRACT | Status: AC
  Administered 2014-01-20: 3 mL via RESPIRATORY_TRACT

## 2014-01-20 MED ORDER — DM-GUAIFENESIN ER 30-600 MG PO TB12: 1.0000 | ORAL_TABLET | Freq: Two times a day (BID) | ORAL | Status: DC | PRN

## 2014-01-20 NOTE — Progress Notes (Signed)
MRN: [id number] Name: [name]  Sex: male Age: 63 y.o. DOB: [birth year]  Allergies: Mobic  Chief Complaint  Patient presents with  . Hospitalization Follow-up    HPI: Patient is 63 y.o. male who was recently hospitalized with worsening cough shortness of breath history of asthma/COPD, hypertension hyperlipidemia, diabetes, mild) asthma and vocal cord dysfunction, patient was treated for COPD exacerbation with the STEROID  and was given doxycycline, as per patient he is only taking one time daily and has not finished the antibiotic yet, I have advised patient to take it 2 times a day, patient has been complaining of cough but denies any productive sputum as per patient he is using Qvar but has not used his albuterol since he did not feel he is wheezing, patient denies any fever chills. Today he is intermittently coughing  Past Medical History  Diagnosis Date  . Hypertension   . Coronary artery disease   . Hepatitis C   . High cholesterol   . Arthritis   . Hepatitis   . COPD (chronic obstructive pulmonary disease)   . Shortness of breath dyspnea   . Asthma   . Diabetes mellitus     type 2    Past Surgical History  Procedure Laterality Date  . Esophagogastroduodenoscopy  02/09/2011    Procedure: ESOPHAGOGASTRODUODENOSCOPY (EGD);  Surgeon: Missy Sabins, MD;  Location: [facility] ENDOSCOPY;  Service: Endoscopy;  Laterality: N/A;  . Joint replacement  10/22/2009    knee      Medication List       This list is accurate as of: 01/20/14  5:42 PM.  Always use your most recent med list.               albuterol (2.5 MG/3ML) 0.083% nebulizer solution  Commonly known as:  PROVENTIL  Take 3 mLs (2.5 mg total) by nebulization every 6 (six) hours as needed for wheezing or shortness of breath.     aspirin EC 81 MG tablet  Take 1 tablet (81 mg total) by mouth daily.     atorvastatin 10 MG tablet  Commonly known as:  LIPITOR  Take 10 mg by mouth daily at 6 PM.     beclomethasone  80 MCG/ACT inhaler  Commonly known as:  QVAR  Inhale 2 puffs into the lungs 2 (two) times daily.     cholecalciferol 1000 UNITS tablet  Commonly known as:  VITAMIN D  Take 1 tablet (1,000 Units total) by mouth daily.     cilostazol 100 MG tablet  Commonly known as:  PLETAL  Take 100 mg by mouth 2 (two) times daily.     dextromethorphan-guaiFENesin 30-600 MG per 12 hr tablet  Commonly known as:  MUCINEX DM  Take 1 tablet by mouth 2 (two) times daily as needed for cough.     doxycycline 100 MG tablet  Commonly known as:  VIBRA-TABS  Take 1 tablet (100 mg total) by mouth every 12 (twelve) hours.     gabapentin 100 MG capsule  Commonly known as:  NEURONTIN  Take 1 capsule (100 mg total) by mouth 3 (three) times daily.     glipiZIDE 5 MG tablet  Commonly known as:  GLUCOTROL  Take 1 tablet (5 mg total) by mouth 2 (two) times daily before a meal.     hydrOXYzine 25 MG tablet  Commonly known as:  ATARAX/VISTARIL  Take 25 mg by mouth every 8 (eight) hours as needed for anxiety.  lisinopril 40 MG tablet  Commonly known as:  PRINIVIL,ZESTRIL  Take 1 tablet (40 mg total) by mouth daily.     metFORMIN 1000 MG tablet  Commonly known as:  GLUCOPHAGE  Take 1 tablet (1,000 mg total) by mouth 2 (two) times daily with a meal.     omeprazole 40 MG capsule  Commonly known as:  PRILOSEC  Take 1 capsule (40 mg total) by mouth daily.     rosuvastatin 20 MG tablet  Commonly known as:  CRESTOR  Take 1 tablet (20 mg total) by mouth daily.     traZODone 50 MG tablet  Commonly known as:  DESYREL  Take 1 tablet (50 mg total) by mouth at bedtime as needed for sleep.        Meds ordered this encounter  Medications  . dextromethorphan-guaiFENesin (MUCINEX DM) 30-600 MG per 12 hr tablet    Sig: Take 1 tablet by mouth 2 (two) times daily as needed for cough.    Dispense:  60 tablet    Refill:  1  . ipratropium-albuterol (DUONEB) 0.5-2.5 (3) MG/3ML nebulizer solution 3 mL    Sig:      Immunization History  Administered Date(s) Administered  . Influenza Split 02/10/2011  . Influenza, Seasonal, Injecte, Preservative Fre 12/19/2012  . Influenza,inj,Quad PF,36+ Mos 12/02/2013  . Pneumococcal Polysaccharide-23 12/02/2013    Family History  Problem Relation Age of Onset  . Stroke Mother   . Diabetes Mother   . Hypertension Mother   . Hyperlipidemia Mother   . Heart disease Father   . Hypertension Father   . Heart attack Father     History  Substance Use Topics  . Smoking status: Former Smoker -- 1.50 packs/day for 30 years    Types: Cigarettes    Quit date: 03/19/2013  . Smokeless tobacco: Never Used  . 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 Pupils are equal, round, and reactive to light.  Cardiovascular: Normal rate and regular rhythm.   Pulmonary/Chest: He has wheezes. He has no rales.  Musculoskeletal: He exhibits no edema.    CBC    Component Value Date/Time   WBC 9.6 01/13/2014 0114   RBC 4.11* 01/13/2014 0114   HGB 11.7* 01/13/2014 0114   HCT 35.0* 01/13/2014 0114   PLT 231 01/13/2014 0114   MCV 85.2 01/13/2014 0114   LYMPHSABS 3.9 12/01/2013 0045   MONOABS 0.5 12/01/2013 0045   EOSABS 0.7 12/01/2013 0045   BASOSABS 0.0 12/01/2013 0045    CMP     Component Value Date/Time   NA 133* 01/13/2014 0114   K 4.6 01/13/2014 0114   CL 97 01/13/2014 0114   CO2 23 01/13/2014 0114   GLUCOSE 362* 01/13/2014 0114   BUN 22 01/13/2014 0114   CREATININE 0.79 01/13/2014 0114   CREATININE 0.78 12/30/2013 1753   CALCIUM 8.3* 01/13/2014 0114   PROT 7.8 01/12/2014 0720   ALBUMIN 4.0 01/12/2014 0720   AST 73* 01/12/2014 0720   ALT 95* 01/12/2014 0720   ALKPHOS 70 01/12/2014  0720   BILITOT 0.6 01/12/2014 0720   GFRNONAA >90 01/13/2014 0114   GFRNONAA >89 12/30/2013  [place] >90 01/13/2014 0114   GFRAA >89 12/30/2013 1753    Lab Results  Component Value Date/Time   CHOL 135 03/17/2013 03:29 PM    No components found for: HGA1C  Lab Results  Component Value Date/Time   AST 73* 01/12/2014 07:20 AM    Assessment and Plan  COPD exacerbation Patient is advised to take antibiotic 2 times a day, he is also given breathing treatment.  Other specified diabetes mellitus without complications - Plan: Glucose (CBG), He'll continue with current medications will repeat his A1c in 3 months  Wheezing Advise patient to use albuterol every 4-6 hourly when necessary.  Cough - Plan: dextromethorphan-guaiFENesin (MUCINEX DM) 30-600 MG per 12 hr tablet   Health Maintenance uptodate with pneumovax and flu shot   Return in about 3 months (around 04/22/2014) for diabetes.  Lorayne Marek, MD

## 2014-01-20 NOTE — Progress Notes (Deleted)
Patient Demographics  [name], is a 63 y.o. male  GXQ:[id number]  XKG:[id number]  DOB - 11/24/1950  CC:  Chief Complaint  Patient presents with  . Hospitalization Follow-up       HPI: [name] is a 63 y.o. male here today to establish medical care. Patient has No headache, No chest pain, No abdominal pain - No Nausea, No new weakness tingling or numbness, No Cough - SOB.  Allergies  Allergen Reactions  . Mobic [Meloxicam] Rash   Past Medical History  Diagnosis Date  . Hypertension   . Coronary artery disease   . Hepatitis C   . High cholesterol   . Arthritis   . Hepatitis   . COPD (chronic obstructive pulmonary disease)   . Shortness of breath dyspnea   . Asthma   . Diabetes mellitus     type 2   Current Outpatient Prescriptions on File Prior to Visit  Medication Sig Dispense Refill  . albuterol (PROVENTIL) (2.5 MG/3ML) 0.083% nebulizer solution Take 3 mLs (2.5 mg total) by nebulization every 6 (six) hours as needed for wheezing or shortness of breath. 75 mL 11  . aspirin EC 81 MG tablet Take 1 tablet (81 mg total) by mouth daily. 30 tablet 3  . atorvastatin (LIPITOR) 10 MG tablet Take 10 mg by mouth daily at 6 PM.    . beclomethasone (QVAR) 80 MCG/ACT inhaler Inhale 2 puffs into the lungs 2 (two) times daily. 1 Inhaler 1  . cholecalciferol (VITAMIN D) 1000 UNITS tablet Take 1 tablet (1,000 Units total) by mouth daily. 30 tablet 0  . cilostazol (PLETAL) 100 MG tablet Take 100 mg by mouth 2 (two) times daily.    [name] doxycycline (VIBRA-TABS) 100 MG tablet Take 1 tablet (100 mg total) by mouth every 12 (twelve) hours. 11 tablet 0  . gabapentin (NEURONTIN) 100 MG capsule Take 1 capsule (100 mg total) by mouth 3 (three) times daily. 90 capsule 1  . glipiZIDE (GLUCOTROL) 5 MG tablet Take 1 tablet (5 mg total) by mouth 2 (two) times daily before a meal. (Patient taking differently: Take 10 mg by mouth 2 (two) times daily before a meal. ) 60 tablet 3  . hydrOXYzine  (ATARAX/VISTARIL) 25 MG tablet Take 25 mg by mouth every 8 (eight) hours as needed for anxiety.    [name] lisinopril (PRINIVIL,ZESTRIL) 40 MG tablet Take 1 tablet (40 mg total) by mouth daily. 30 tablet 3  . metFORMIN (GLUCOPHAGE) 1000 MG tablet Take 1 tablet (1,000 mg total) by mouth 2 (two) times daily with a meal. 180 tablet 3  . omeprazole (PRILOSEC) 40 MG capsule Take 1 capsule (40 mg total) by mouth daily. 30 capsule 3  . rosuvastatin (CRESTOR) 20 MG tablet Take 1 tablet (20 mg total) by mouth daily. 90 tablet 3  . traZODone (DESYREL) 50 MG tablet Take 1 tablet (50 mg total) by mouth at bedtime as needed for sleep. 30 tablet 0   No current facility-administered medications on file prior to visit.   Family History  Problem Relation Age of Onset  . Stroke Mother   . Diabetes Mother   . Hypertension Mother   . Hyperlipidemia Mother   . Heart disease Father   . Hypertension Father   . Heart attack Father    History   Social History  . Marital Status: Single    Spouse Name: N/A    Number of Children: N/A  . Years of Education: N/A   Occupational  History  . Not on file.   Social History Main Topics  . Smoking status: Former Smoker -- 1.50 packs/day for 30 years    Types: Cigarettes    Quit date: 03/19/2013  . Smokeless tobacco: Never Used  . 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 Drug Use: No  . Sexual Activity: Not on file   Other Topics Concern  . Not on file   Social History Narrative    Review of Systems: Constitutional: Negative for fever, chills, diaphoresis, activity change, appetite change and fatigue. HENT: Negative for ear pain, nosebleeds, congestion, facial swelling, rhinorrhea, neck pain, neck stiffness and ear discharge.  Eyes: Negative for pain, discharge, redness, itching and visual disturbance. Respiratory: Negative for cough, choking, chest tightness, shortness of breath, wheezing and stridor.  Cardiovascular: Negative for  chest pain, palpitations and leg swelling. Gastrointestinal: Negative for abdominal distention. Genitourinary: Negative for dysuria, urgency, frequency, hematuria, flank pain, decreased urine volume, difficulty urinating and dyspareunia.  Musculoskeletal: Negative for back pain, joint swelling, arthralgia and gait problem. Neurological: Negative for dizziness, tremors, seizures, syncope, facial asymmetry, speech difficulty, weakness, light-headedness, numbness and headaches.  Hematological: Negative for adenopathy. Does not bruise/bleed easily. Psychiatric/Behavioral: Negative for hallucinations, behavioral problems, confusion, dysphoric mood, decreased concentration and agitation.    Objective:   Filed Vitals:   01/20/14 1640  BP: 119/81  Pulse: 98  Temp: 98.5 F (36.9 C)  Resp: 16    Physical Exam: Constitutional: Patient appears well-developed and well-nourished. No distress. HENT: Normocephalic, atraumatic, External right and left ear normal. Oropharynx is clear and moist.  Eyes: Conjunctivae and EOM are normal. PERRLA, no scleral icterus. Neck: Normal ROM. Neck supple. No JVD. No tracheal deviation. No thyromegaly. CVS: RRR, S1/S2 +, no murmurs, no gallops, no carotid bruit.  Pulmonary: Effort and breath sounds normal, no stridor, rhonchi, wheezes, rales.  Abdominal: Soft. BS +, no distension, tenderness, rebound or guarding.  Musculoskeletal: Normal range of motion. No edema and no tenderness.  Lymphadenopathy: No lymphadenopathy noted, cervical, inguinal or axillary Neuro: Alert. Normal reflexes, muscle tone coordination. No cranial nerve deficit. Skin: Skin is warm and dry. No rash noted. Not diaphoretic. No erythema. No pallor. Psychiatric: Normal mood and affect. Behavior, judgment, thought content normal.  Lab Results  Component Value Date   WBC 9.6 01/13/2014   HGB 11.7* 01/13/2014   HCT 35.0* 01/13/2014   MCV 85.2 01/13/2014   PLT 231 01/13/2014   Lab Results    Component Value Date   CREATININE 0.79 01/13/2014   BUN 22 01/13/2014   NA 133* 01/13/2014   K 4.6 01/13/2014   CL 97 01/13/2014   CO2 23 01/13/2014    Lab Results  Component Value Date   HGBA1C 8.6* 12/01/2013   Lipid Panel     Component Value Date/Time   CHOL 135 03/17/2013 1529   TRIG 141 03/17/2013 1529   HDL 37* 03/17/2013 1529   CHOLHDL 3.6 03/17/2013 1529   VLDL 28 03/17/2013 1529   LDLCALC 70 03/17/2013 1529       Assessment and plan:   1. COPD exacerbation ***  2. Other specified diabetes mellitus without complications *** - Glucose (CBG)  3. Wheezing ***  4. Cough ***        Health Maintenance -Colonoscopy: -Pap Smear: -Mammogram: -Vaccinations:  -TdAP  -PNA (PPSV23) (one dose after 65) (or one dose before 65 if chronic conditions)  -Zoster (1 dose after 60 yrs)  -Influenza  No Follow-up on file.    The patient was given clear instructions to go to ER or return to medical center if symptoms don't improve, worsen or new problems develop. The patient verbalized understanding. The patient was told to call to get lab results if they haven't heard anything in the next week.     Lorayne Marek, MD

## 2014-01-20 NOTE — Progress Notes (Signed)
Patient recently discharged from the hospital for COPD exasperation Patient complains of persistent cough and can not sleep at night He states he coughs so hard it makes his chest hurt

## 2014-01-21 ENCOUNTER — Ambulatory Visit (INDEPENDENT_AMBULATORY_CARE_PROVIDER_SITE_OTHER): Payer: Commercial Managed Care - HMO | Admitting: Vascular Surgery

## 2014-01-21 ENCOUNTER — Ambulatory Visit (HOSPITAL_COMMUNITY)
Admission: RE | Admit: 2014-01-21 | Discharge: 2014-01-21 | Disposition: A | Discharge status: Home / Self Care | Payer: Medicare HMO | Source: Ambulatory Visit | Attending: Vascular Surgery | Admitting: Vascular Surgery

## 2014-01-21 ENCOUNTER — Encounter: Payer: Self-pay | Admitting: Vascular Surgery

## 2014-01-21 VITALS — BP 132/74 | HR 94 | Resp 18 | Ht 66.0 in | Wt 164.0 lb

## 2014-01-21 DIAGNOSIS — E785 Hyperlipidemia, unspecified: Secondary | ICD-10-CM | POA: Diagnosis not present

## 2014-01-21 DIAGNOSIS — I70219 Atherosclerosis of native arteries of extremities with intermittent claudication, unspecified extremity: Secondary | ICD-10-CM

## 2014-01-21 DIAGNOSIS — I10 Essential (primary) hypertension: Secondary | ICD-10-CM | POA: Insufficient documentation

## 2014-01-21 DIAGNOSIS — E119 Type 2 diabetes mellitus without complications: Secondary | ICD-10-CM | POA: Diagnosis not present

## 2014-01-21 NOTE — Assessment & Plan Note (Signed)
The patient has stable bilateral lower extremity claudication. 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 Currently his biggest issue is his pulmonary problems. I've encouraged him to stay as active as possible. Fortunately he has quit smoking. He is on aspirin and is on a statin. I have ordered follow up ABIs in 1 year and I will see him back at that time. He knows to call sooner if he has problems.

## 2014-01-21 NOTE — Progress Notes (Signed)
[facility] [place]  Patient name: [name] MRN: [id number] DOB: 12/30/1950 Sex: male  REASON FOR VISIT: follow up of peripheral vascular disease.  HPI: [name] is a 63 y.o. male Y last saw in the office on 02/11/2013 with bilateral lower extremity claudication. At that time, based on his exam, he appeared to have evidence of bilateral superficial femoral artery occlusive disease which was more significant on the right side. We discussed the importance of tobacco cessation and a structured walking program. He returns for a 9 month follow up visit.  Since I saw him last, he's had significant pulmonary problems. He has been diagnosed with COPD and asthma.  His breathing problems limit his activity somewhat. He does continue to have bilateral lower extremity claudication which mostly involves his calves. He also has significant cramps at night but I do not think this is related to his peripheral vascular disease. I do not get any history of rest pain. I do not get any history of nonhealing ulcers. He quit smoking in January of this year.  Past Medical History  Diagnosis Date  . Hypertension   . Coronary artery disease   . Hepatitis C   . High cholesterol   . Arthritis   . Hepatitis   . COPD (chronic obstructive pulmonary disease)   . Shortness of breath dyspnea   . Asthma   . Diabetes mellitus     type 2   Family History  Problem Relation Age of Onset  . Stroke Mother   . Diabetes Mother   . Hypertension Mother   . Hyperlipidemia Mother   . Heart disease Father   . Hypertension Father   . Heart attack Father    SOCIAL HISTORY: History  Substance Use Topics  . Smoking status: Former Smoker -- 1.50 packs/day for 30 years    Types: Cigarettes    Quit date: 03/19/2013  . Smokeless tobacco: Never Used  . 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 Mobic [Meloxicam] Rash   Current  Outpatient Prescriptions  Medication Sig Dispense Refill  . albuterol (PROVENTIL) (2.5 MG/3ML) 0.083% nebulizer solution Take 3 mLs (2.5 mg total) by nebulization every 6 (six) hours as needed for wheezing or shortness of breath. 75 mL 11  . aspirin EC 81 MG tablet Take 1 tablet (81 mg total) by mouth daily. 30 tablet 3  . atorvastatin (LIPITOR) 10 MG tablet Take 10 mg by mouth daily at 6 PM.    . beclomethasone (QVAR) 80 MCG/ACT inhaler Inhale 2 puffs into the lungs 2 (two) times daily. 1 Inhaler 1  . cholecalciferol (VITAMIN D) 1000 UNITS tablet Take 1 tablet (1,000 Units total) by mouth daily. 30 tablet 0  . cilostazol (PLETAL) 100 MG tablet Take 100 mg by mouth 2 (two) times daily.    [name] dextromethorphan-guaiFENesin (MUCINEX DM) 30-600 MG per 12 hr tablet Take 1 tablet by mouth 2 (two) times daily as needed for cough. 60 tablet 1  . doxycycline (VIBRA-TABS) 100 MG tablet Take 1 tablet (100 mg total) by mouth every 12 (twelve) hours. 11 tablet 0  . gabapentin (NEURONTIN) 100 MG capsule Take 1 capsule (100 mg total) by mouth 3 (three) times daily. 90 capsule 1  . glipiZIDE (GLUCOTROL) 5 MG tablet Take 1 tablet (5 mg total) by mouth 2 (two) times daily before a meal. (Patient taking differently: Take 10 mg  by mouth 2 (two) times daily before a meal. ) 60 tablet 3  . hydrOXYzine (ATARAX/VISTARIL) 25 MG tablet Take 25 mg by mouth every 8 (eight) hours as needed for anxiety.    [name] lisinopril (PRINIVIL,ZESTRIL) 40 MG tablet Take 1 tablet (40 mg total) by mouth daily. 30 tablet 3  . metFORMIN (GLUCOPHAGE) 1000 MG tablet Take 1 tablet (1,000 mg total) by mouth 2 (two) times daily with a meal. 180 tablet 3  . omeprazole (PRILOSEC) 40 MG capsule Take 1 capsule (40 mg total) by mouth daily. 30 capsule 3  . rosuvastatin (CRESTOR) 20 MG tablet Take 1 tablet (20 mg total) by mouth daily. 90 tablet 3  . traZODone (DESYREL) 50 MG tablet Take 1 tablet (50 mg total) by mouth at bedtime as needed for sleep. 30 tablet  0   No current facility-administered medications for this visit.   REVIEW OF SYSTEMS: Valu.Nieves ] denotes positive finding; [  ] denotes negative finding  CARDIOVASCULAR:  Valu.Nieves ] chest pain   [ ]  chest pressure   [ ]  palpitations   Valu.Nieves ] orthopnea   Valu.Nieves ] dyspnea on exertion   [ ]  claudication   [ ]  rest pain   [ ]  DVT   [ ]  phlebitis PULMONARY:   [ ]  productive cough   Valu.Nieves ] asthma   Valu.Nieves ] wheezing NEUROLOGIC:   [ ]  weakness  [ ]  paresthesias  [ ]  aphasia  [ ]  amaurosis  [ ]  dizziness HEMATOLOGIC:   [ ]  bleeding problems   [ ]  clotting disorders MUSCULOSKELETAL:  [ ]  joint pain   [ ]  joint swelling [ ]  leg swelling GASTROINTESTINAL: [ ]   blood in stool  [ ]   hematemesis GENITOURINARY:  [ ]   dysuria  [ ]   hematuria PSYCHIATRIC:  [ ]  history of major depression INTEGUMENTARY:  [ ]  rashes  [ ]  ulcers CONSTITUTIONAL:  [ ]  fever   [ ]  chills  PHYSICAL EXAM: Filed Vitals:   01/21/14 1612  BP: 132/74  Pulse: 94  Resp: 18  Height: 5\' 6"  (1.676 m)  Weight: 164 lb (74.39 kg)   Body mass index is 26.48 kg/(m^2). GENERAL: The patient is a well-nourished male, in no acute distress. The vital signs are documented above. CARDIOVASCULAR: There is a regular rate and rhythm. I do not detect carotid bruits. He has palpable femoral pulses. I cannot palpate pedal pulses. Both feet appear adequately perfused. There is no significant lower extremity swelling. PULMONARY: There is good air exchange bilaterally without wheezing or rales. ABDOMEN: Soft and non-tender with normal pitched bowel sounds.  MUSCULOSKELETAL: There are no major deformities or cyanosis. NEUROLOGIC: No focal weakness or paresthesias are detected. SKIN: There are no ulcers or rashes noted. PSYCHIATRIC: The patient has a normal affect.  DATA:  I have independently interpreted his arterial Doppler study today. On the right side he has an ABI of 69% which is stable compared to 66% on his previous visit. He has a monophasic right posterior tibial  signal with a biphasic dorsalis pedis signal. On the left side, ABIs 79%. This is stable compared to an ABI of 82% previously. He has a biphasic posterior tibial and dorsalis pedis signal.  MEDICAL ISSUES:  Atherosclerosis of native arteries of the extremities with intermittent claudication The patient has stable bilateral lower extremity claudication. 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 Currently his biggest issue is his pulmonary problems. I've encouraged him to stay as active as possible.  Fortunately he has quit smoking. He is on aspirin and is on a statin. I have ordered follow up ABIs in 1 year and I will see him back at that time. He knows to call sooner if he has problems.    Return in about 1 year (around 01/22/2015).   [facility] [place] Beeper: [phone]

## 2014-01-26 ENCOUNTER — Other Ambulatory Visit: Payer: Self-pay | Admitting: Internal Medicine

## 2014-02-02 HISTORY — PX: CATARACT EXTRACTION: SUR2

## 2014-02-04 ENCOUNTER — Other Ambulatory Visit: Payer: Self-pay

## 2014-02-04 ENCOUNTER — Telehealth: Payer: Self-pay

## 2014-02-04 DIAGNOSIS — E785 Hyperlipidemia, unspecified: Secondary | ICD-10-CM

## 2014-02-04 MED ORDER — ROSUVASTATIN CALCIUM 20 MG PO TABS: 20.0000 mg | ORAL_TABLET | Freq: Every day | ORAL | Status: DC

## 2014-02-04 NOTE — Telephone Encounter (Signed)
Patient called requesting refill on his cholesterol medication Refill sent to [facility] on file

## 2014-02-16 ENCOUNTER — Other Ambulatory Visit: Payer: Self-pay | Admitting: Emergency Medicine

## 2014-02-16 MED ORDER — GABAPENTIN 100 MG PO CAPS
100.0000 mg | ORAL_CAPSULE | Freq: Three times a day (TID) | ORAL | Status: DC
Start: 2014-02-16 — End: 2014-07-01

## 2014-03-12 ENCOUNTER — Other Ambulatory Visit: Payer: Self-pay | Admitting: Internal Medicine

## 2014-03-28 DIAGNOSIS — J441 Chronic obstructive pulmonary disease with (acute) exacerbation: Secondary | ICD-10-CM | POA: Diagnosis not present

## 2014-03-31 ENCOUNTER — Ambulatory Visit: Payer: Commercial Managed Care - HMO | Attending: Physician Assistant | Admitting: Physician Assistant

## 2014-03-31 VITALS — BP 161/74 | HR 90 | Temp 98.0°F | Resp 16 | Ht 65.0 in | Wt 178.2 lb

## 2014-03-31 DIAGNOSIS — Z7982 Long term (current) use of aspirin: Secondary | ICD-10-CM | POA: Insufficient documentation

## 2014-03-31 DIAGNOSIS — R21 Rash and other nonspecific skin eruption: Secondary | ICD-10-CM | POA: Insufficient documentation

## 2014-03-31 DIAGNOSIS — Z79899 Other long term (current) drug therapy: Secondary | ICD-10-CM | POA: Diagnosis not present

## 2014-03-31 DIAGNOSIS — Z7952 Long term (current) use of systemic steroids: Secondary | ICD-10-CM | POA: Diagnosis not present

## 2014-03-31 MED ORDER — HYDROXYZINE HCL 25 MG PO TABS
25.0000 mg | ORAL_TABLET | Freq: Three times a day (TID) | ORAL | Status: DC | PRN
Start: 2014-03-31 — End: 2014-08-17

## 2014-03-31 MED ORDER — PREDNISONE 20 MG PO TABS: 20.0000 mg | ORAL_TABLET | Freq: Every day | ORAL | Status: DC

## 2014-03-31 NOTE — Progress Notes (Signed)
Patient presents for 6 month hx hives and generalized pruritis worsening over last week Notes 3 hour relief with benedryl 50 mg Unable to sleep last 10 days due to itching Applying rubbing alcohol all over to cool skin Has been scratching with fingernails and c/o pain where skin has broken open

## 2014-03-31 NOTE — Patient Instructions (Signed)
Hives Hives are itchy, red, swollen areas of the skin. They can vary in size and location on your body. Hives can come and go for hours or several days (acute hives) or for several weeks (chronic hives). Hives do not spread from person to person (noncontagious). They may get worse with scratching, exercise, and emotional stress. CAUSES   Allergic reaction to food, additives, or drugs.  Infections, including the common cold.  Illness, such as vasculitis, lupus, or thyroid disease.  Exposure to sunlight, heat, or cold.  Exercise.  Stress.  Contact with chemicals. SYMPTOMS   Red or white swollen patches on the skin. The patches may change size, shape, and location quickly and repeatedly.  Itching.  Swelling of the hands, feet, and face. This may occur if hives develop deeper in the skin. DIAGNOSIS  Your caregiver can usually tell what is wrong by performing a physical exam. Skin or blood tests may also be done to determine the cause of your hives. In some cases, the cause cannot be determined. TREATMENT  Mild cases usually get better with medicines such as antihistamines. Severe cases may require an emergency epinephrine injection. If the cause of your hives is known, treatment includes avoiding that trigger.  HOME CARE INSTRUCTIONS   Avoid causes that trigger your hives.  Take antihistamines as directed by your caregiver to reduce the severity of your hives. Non-sedating or low-sedating antihistamines are usually recommended. Do not drive while taking an antihistamine.  Take any other medicines prescribed for itching as directed by your caregiver.  Wear loose-fitting clothing.  Keep all follow-up appointments as directed by your caregiver. SEEK MEDICAL CARE IF:   You have persistent or severe itching that is not relieved with medicine.  You have painful or swollen joints. SEEK IMMEDIATE MEDICAL CARE IF:   You have a fever.  Your tongue or lips are swollen.  You have  trouble breathing or swallowing.  You feel tightness in the throat or chest.  You have abdominal pain. These problems may be the first sign of a life-threatening allergic reaction. Call your local emergency services (911 in [place] MAKE SURE YOU:   Understand these instructions.  Will watch your condition.  Will get help right away if you are not doing well or get worse. Document Released: 02/19/2005 Document Revised: 02/24/2013 Document Reviewed: 05/15/2011 Exit[employer] Patient Information 2015 [employer]. This information is not intended to replace advice given to you by your health care provider. Make sure you discuss any questions you have with your health care provider.  

## 2014-03-31 NOTE — Progress Notes (Signed)
Chief Complaint: rash  Subjective: 64 yo male presents acutely with worsening rash nearly all over his body. Started about 6 months ago. Was seen here and treated with Zyrtec and a referral was made to [facility]. He did not go. He states that around the same time he needed to see an eye doctor for cataracts and the rash had improved.  He itches constantly. He can not sleep, awakens q 2 hours needing to scratch.    ROS:  GEN: denies fever or chills, denies change in weight Skin: + lesions or rashes HEENT: denies headache, earache, epistaxis, sore throat, or neck pain LUNGS: denies SHOB, dyspnea, PND, orthopnea CV: denies CP or palpitations ABD: denies abd pain, N or V EXT: denies muscle spasms or swelling; no pain in lower ext, no weakness NEURO: denies numbness or tingling, denies sz, stroke or TIA   Objective:  Filed Vitals:   03/31/14 1446  BP: 161/74  Pulse: 90  Temp: 98 F (36.7 C)  TempSrc: Oral  Resp: 16  Height: 5\' 5"  (1.651 m)  Weight: 178 lb 3.2 oz (80.831 kg)  SpO2: 99%    Physical Exam:  General: in no acute distress. HEENT: no pallor, no icterus, moist oral mucosa, no JVD, no lymphadenopathy Heart: Normal  s1 &s2  Regular rate and rhythm, without murmurs, rubs, gallops. Lungs: Clear to auscultation bilaterally. Abdomen: Soft, nontender, nondistended, positive bowel sounds. Extremities: No clubbing cyanosis or edema with positive pedal pulses. Neuro: Alert, awake, oriented x3, nonfocal.  Pertinent Lab Results:none   Medications: Prior to Admission medications   Medication Sig Start Date End Date Taking? Authorizing Provider  albuterol (PROVENTIL) (2.5 MG/3ML) 0.083% nebulizer solution Take 3 mLs (2.5 mg total) by nebulization every 6 (six) hours as needed for wheezing or shortness of breath. 12/04/13  Yes Kinnie Feil, MD  aspirin EC 81 MG tablet Take 1 tablet (81 mg total) by mouth daily. 08/31/13  Yes Lorayne Marek, MD  atorvastatin (LIPITOR) 10 MG tablet  Take 10 mg by mouth daily at 6 PM.   Yes Historical Provider, MD  beclomethasone (QVAR) 80 MCG/ACT inhaler Inhale 2 puffs into the lungs 2 (two) times daily. 12/04/13  Yes Kinnie Feil, MD  cholecalciferol (VITAMIN D) 1000 UNITS tablet Take 1 tablet (1,000 Units total) by mouth daily. 08/20/12  Yes Robbie Lis, MD  gabapentin (NEURONTIN) 100 MG capsule Take 1 capsule (100 mg total) by mouth 3 (three) times daily. 02/16/14  Yes Lorayne Marek, MD  glipiZIDE (GLUCOTROL) 5 MG tablet Take 1 tablet (5 mg total) by mouth 2 (two) times daily before a meal. Patient taking differently: Take 10 mg by mouth 2 (two) times daily before a meal.  12/04/13  Yes Kinnie Feil, MD  lisinopril (PRINIVIL,ZESTRIL) 40 MG tablet TAKE ONE TABLET BY MOUTH ONCE DAILY 03/12/14  Yes Lorayne Marek, MD  metFORMIN (GLUCOPHAGE) 1000 MG tablet Take 1 tablet (1,000 mg total) by mouth 2 (two) times daily with a meal. 12/04/13  Yes Kinnie Feil, MD  omeprazole (PRILOSEC) 40 MG capsule TAKE ONE CAPSULE BY MOUTH ONCE DAILY 03/12/14  Yes Deepak Advani, MD  rosuvastatin (CRESTOR) 20 MG tablet Take 1 tablet (20 mg total) by mouth daily. 02/04/14  Yes Lorayne Marek, MD  cilostazol (PLETAL) 100 MG tablet Take 100 mg by mouth 2 (two) times daily.    Historical Provider, MD  dextromethorphan-guaiFENesin (MUCINEX DM) 30-600 MG per 12 hr tablet Take 1 tablet by mouth 2 (two) times daily as needed  for cough. Patient not taking: Reported on 03/31/2014 01/20/14   Lorayne Marek, MD  doxycycline (VIBRA-TABS) 100 MG tablet Take 1 tablet (100 mg total) by mouth every 12 (twelve) hours. Patient not taking: Reported on 03/31/2014 01/13/14   Timmothy Euler, MD  hydrOXYzine (ATARAX/VISTARIL) 25 MG tablet Take 1 tablet (25 mg total) by mouth every 8 (eight) hours as needed for anxiety. 03/31/14   Brayton Caves, PA-C  predniSONE (DELTASONE) 20 MG tablet Take 1 tablet (20 mg total) by mouth daily with breakfast. 03/31/14   Brayton Caves, PA-C  traZODone  (DESYREL) 50 MG tablet Take 1 tablet (50 mg total) by mouth at bedtime as needed for sleep. Patient not taking: Reported on 03/31/2014 01/13/14   Timmothy Euler, MD   Assessment: 1. Body Rash  Plan: Atarax Prednisone Derm referral  2 weeks with Advani, MD  Follow up:2 weeks  The patient was given clear instructions to go to ER or return to medical center if symptoms don't improve, worsen or new problems develop. The patient verbalized understanding. The patient was told to call to get lab results if they haven't heard anything in the next week.   This note has been created with Surveyor, quantity. Any transcriptional errors are unintentional.   Zettie Pho, PA-C 03/31/2014, 3:58 PM

## 2014-04-06 ENCOUNTER — Encounter: Payer: Commercial Managed Care - HMO | Admitting: Internal Medicine

## 2014-04-28 DIAGNOSIS — J441 Chronic obstructive pulmonary disease with (acute) exacerbation: Secondary | ICD-10-CM | POA: Diagnosis not present

## 2014-05-27 DIAGNOSIS — J441 Chronic obstructive pulmonary disease with (acute) exacerbation: Secondary | ICD-10-CM | POA: Diagnosis not present

## 2014-06-27 DIAGNOSIS — J441 Chronic obstructive pulmonary disease with (acute) exacerbation: Secondary | ICD-10-CM | POA: Diagnosis not present

## 2014-07-01 ENCOUNTER — Other Ambulatory Visit: Payer: Self-pay | Admitting: Internal Medicine

## 2014-08-03 ENCOUNTER — Telehealth: Payer: Self-pay | Admitting: Internal Medicine

## 2014-08-03 NOTE — Telephone Encounter (Signed)
Patient called requesting medication refill for metFORMIN (GLUCOPHAGE) 1000 MG tablet,glipiZIDE (GLUCOTROL) 5 MG tablet. Patient uses [facility] on [address] Please f/u with patient

## 2014-08-06 ENCOUNTER — Other Ambulatory Visit: Payer: Self-pay | Admitting: Internal Medicine

## 2014-08-10 ENCOUNTER — Telehealth: Payer: Self-pay | Admitting: Internal Medicine

## 2014-08-10 NOTE — Telephone Encounter (Signed)
Pt called requesting medication refill for metFORMIN (GLUCOPHAGE) 1000 MG tablet Patient has been out of his medication, please f/u with patient  Patient would like enough medication before 08/17/14

## 2014-08-11 ENCOUNTER — Other Ambulatory Visit: Payer: Self-pay

## 2014-08-11 MED ORDER — METFORMIN HCL 1000 MG PO TABS: ORAL_TABLET | ORAL | Status: DC

## 2014-08-13 ENCOUNTER — Telehealth: Payer: Self-pay

## 2014-08-13 ENCOUNTER — Telehealth: Payer: Self-pay | Admitting: Internal Medicine

## 2014-08-13 NOTE — Telephone Encounter (Signed)
Patient has called to inform practice that he is switching PCP and practices;

## 2014-08-13 NOTE — Telephone Encounter (Signed)
Returned patient phone call Patient was informed his medication was fill on 6/8 and that  It is ready to be picked up

## 2014-08-17 ENCOUNTER — Ambulatory Visit: Payer: Commercial Managed Care - HMO | Attending: Internal Medicine | Admitting: Internal Medicine

## 2014-08-17 ENCOUNTER — Encounter: Payer: Self-pay | Admitting: Internal Medicine

## 2014-08-17 VITALS — BP 159/94 | HR 92 | Temp 98.0°F | Resp 16 | Wt 175.0 lb

## 2014-08-17 DIAGNOSIS — E139 Other specified diabetes mellitus without complications: Secondary | ICD-10-CM | POA: Diagnosis not present

## 2014-08-17 DIAGNOSIS — K219 Gastro-esophageal reflux disease without esophagitis: Secondary | ICD-10-CM | POA: Diagnosis not present

## 2014-08-17 DIAGNOSIS — I10 Essential (primary) hypertension: Secondary | ICD-10-CM | POA: Diagnosis not present

## 2014-08-17 DIAGNOSIS — Z87891 Personal history of nicotine dependence: Secondary | ICD-10-CM | POA: Insufficient documentation

## 2014-08-17 DIAGNOSIS — E785 Hyperlipidemia, unspecified: Secondary | ICD-10-CM | POA: Diagnosis not present

## 2014-08-17 LAB — POCT GLYCOSYLATED HEMOGLOBIN (HGB A1C): HEMOGLOBIN A1C: 8.7

## 2014-08-17 LAB — GLUCOSE, POCT (MANUAL RESULT ENTRY): POC Glucose: 182 mg/dl — AB (ref 70–99)

## 2014-08-17 MED ORDER — HYDROXYZINE HCL 25 MG PO TABS: 25.0000 mg | ORAL_TABLET | Freq: Three times a day (TID) | ORAL | Status: DC | PRN

## 2014-08-17 MED ORDER — METFORMIN HCL 1000 MG PO TABS: ORAL_TABLET | ORAL | Status: DC

## 2014-08-17 MED ORDER — LISINOPRIL 40 MG PO TABS: 40.0000 mg | ORAL_TABLET | Freq: Every day | ORAL | Status: DC

## 2014-08-17 MED ORDER — ATORVASTATIN CALCIUM 10 MG PO TABS: 10.0000 mg | ORAL_TABLET | Freq: Every day | ORAL | Status: DC

## 2014-08-17 MED ORDER — OMEPRAZOLE 40 MG PO CPDR: 40.0000 mg | DELAYED_RELEASE_CAPSULE | Freq: Every day | ORAL | Status: DC

## 2014-08-17 MED ORDER — GABAPENTIN 100 MG PO CAPS
ORAL_CAPSULE | ORAL | Status: DC
Start: 2014-08-17 — End: 2014-12-15

## 2014-08-17 MED ORDER — GLIPIZIDE 10 MG PO TABS
ORAL_TABLET | ORAL | Status: DC
Start: 2014-08-17 — End: 2014-09-27

## 2014-08-17 NOTE — Patient Instructions (Signed)
Diabetes Mellitus and Food It is important for you to manage your blood sugar (glucose) level. Your blood glucose level can be greatly affected by what you eat. Eating healthier foods in the appropriate amounts throughout the day at about the same time each day will help you control your blood glucose level. It can also help slow or prevent worsening of your diabetes mellitus. Healthy eating may even help you improve the level of your blood pressure and reach or maintain a healthy weight.  HOW CAN FOOD AFFECT ME? Carbohydrates Carbohydrates affect your blood glucose level more than any other type of food. Your dietitian will help you determine how many carbohydrates to eat at each meal and teach you how to count carbohydrates. Counting carbohydrates is important to keep your blood glucose at a healthy level, especially if you are using insulin or taking certain medicines for diabetes mellitus. Alcohol Alcohol can cause sudden decreases in blood glucose (hypoglycemia), especially if you use insulin or take certain medicines for diabetes mellitus. Hypoglycemia can be a life-threatening condition. Symptoms of hypoglycemia (sleepiness, dizziness, and disorientation) are similar to symptoms of having too much alcohol.  If your health care provider has given you approval to drink alcohol, do so in moderation and use the following guidelines:  Women should not have more than one drink per day, and men should not have more than two drinks per day. One drink is equal to:  12 oz of beer.  5 oz of wine.  1 oz of hard liquor.  Do not drink on an empty stomach.  Keep yourself hydrated. Have water, diet soda, or unsweetened iced tea.  Regular soda, juice, and other mixers might contain a lot of carbohydrates and should be counted. WHAT FOODS ARE NOT RECOMMENDED? As you make food choices, it is important to remember that all foods are not the same. Some foods have fewer nutrients per serving than other  foods, even though they might have the same number of calories or carbohydrates. It is difficult to get your body what it needs when you eat foods with fewer nutrients. Examples of foods that you should avoid that are high in calories and carbohydrates but low in nutrients include:  Trans fats (most processed foods list trans fats on the Nutrition Facts label).  Regular soda.  Juice.  Candy.  Sweets, such as cake, pie, doughnuts, and cookies.  Fried foods. WHAT FOODS CAN I EAT? Have nutrient-rich foods, which will nourish your body and keep you healthy. The food you should eat also will depend on several factors, including:  The calories you need.  The medicines you take.  Your weight.  Your blood glucose level.  Your blood pressure level.  Your cholesterol level. You also should eat a variety of foods, including:  Protein, such as meat, poultry, fish, tofu, nuts, and seeds (lean animal proteins are best).  Fruits.  Vegetables.  Dairy products, such as milk, cheese, and yogurt (low fat is best).  Breads, grains, pasta, cereal, rice, and beans.  Fats such as olive oil, trans fat-free margarine, canola oil, avocado, and olives. DOES EVERYONE WITH DIABETES MELLITUS HAVE THE SAME MEAL PLAN? Because every person with diabetes mellitus is different, there is not one meal plan that works for everyone. It is very important that you meet with a dietitian who will help you create a meal plan that is just right for you. Document Released: 11/16/2004 Document Revised: 02/24/2013 Document Reviewed: 01/16/2013 ExitCare Patient Information 2015 [employer]. This   information is not intended to replace advice given to you by your health care provider. Make sure you discuss any questions you have with your health care provider. DASH Eating Plan DASH stands for "Dietary Approaches to Stop Hypertension." The DASH eating plan is a healthy eating plan that has been shown to reduce high  blood pressure (hypertension). Additional health benefits may include reducing the risk of type 2 diabetes mellitus, heart disease, and stroke. The DASH eating plan may also help with weight loss. WHAT DO I NEED TO KNOW ABOUT THE DASH EATING PLAN? For the DASH eating plan, you will follow these general guidelines:  Choose foods with a percent daily value for sodium of less than 5% (as listed on the food label).  Use salt-free seasonings or herbs instead of table salt or sea salt.  Check with your health care provider or pharmacist before using salt substitutes.  Eat lower-sodium products, often labeled as "lower sodium" or "no salt added."  Eat fresh foods.  Eat more vegetables, fruits, and low-fat dairy products.  Choose whole grains. Look for the word "whole" as the first word in the ingredient list.  Choose fish and skinless chicken or turkey more often than red meat. Limit fish, poultry, and meat to 6 oz (170 g) each day.  Limit sweets, desserts, sugars, and sugary drinks.  Choose heart-healthy fats.  Limit cheese to 1 oz (28 g) per day.  Eat more home-cooked food and less restaurant, buffet, and fast food.  Limit fried foods.  Cook foods using methods other than frying.  Limit canned vegetables. If you do use them, rinse them well to decrease the sodium.  When eating at a restaurant, ask that your food be prepared with less salt, or no salt if possible. WHAT FOODS CAN I EAT? Seek help from a dietitian for individual calorie needs. Grains Whole grain or whole wheat bread. Brown rice. Whole grain or whole wheat pasta. Quinoa, bulgur, and whole grain cereals. Low-sodium cereals. Corn or whole wheat flour tortillas. Whole grain cornbread. Whole grain crackers. Low-sodium crackers. Vegetables Fresh or frozen vegetables (raw, steamed, roasted, or grilled). Low-sodium or reduced-sodium tomato and vegetable juices. Low-sodium or reduced-sodium tomato sauce and paste. Low-sodium  or reduced-sodium canned vegetables.  Fruits All fresh, canned (in natural juice), or frozen fruits. Meat and Other Protein Products Ground beef (85% or leaner), grass-fed beef, or beef trimmed of fat. Skinless chicken or turkey. Ground chicken or turkey. Pork trimmed of fat. All fish and seafood. Eggs. Dried beans, peas, or lentils. Unsalted nuts and seeds. Unsalted canned beans. Dairy Low-fat dairy products, such as skim or 1% milk, 2% or reduced-fat cheeses, low-fat ricotta or cottage cheese, or plain low-fat yogurt. Low-sodium or reduced-sodium cheeses. Fats and Oils Tub margarines without trans fats. Light or reduced-fat mayonnaise and salad dressings (reduced sodium). Avocado. Safflower, olive, or canola oils. Natural peanut or almond butter. Other Unsalted popcorn and pretzels. The items listed above may not be a complete list of recommended foods or beverages. Contact your dietitian for more options. WHAT FOODS ARE NOT RECOMMENDED? Grains White bread. White pasta. White rice. Refined cornbread. Bagels and croissants. Crackers that contain trans fat. Vegetables Creamed or fried vegetables. Vegetables in a cheese sauce. Regular canned vegetables. Regular canned tomato sauce and paste. Regular tomato and vegetable juices. Fruits Dried fruits. Canned fruit in light or heavy syrup. Fruit juice. Meat and Other Protein Products Fatty cuts of meat. Ribs, chicken wings, bacon, sausage, bologna, salami, chitterlings, fatback, hot   dogs, bratwurst, and packaged luncheon meats. Salted nuts and seeds. Canned beans with salt. Dairy Whole or 2% milk, cream, half-and-half, and cream cheese. Whole-fat or sweetened yogurt. Full-fat cheeses or blue cheese. Nondairy creamers and whipped toppings. Processed cheese, cheese spreads, or cheese curds. Condiments Onion and garlic salt, seasoned salt, table salt, and sea salt. Canned and packaged gravies. Worcestershire sauce. Tartar sauce. Barbecue sauce.  Teriyaki sauce. Soy sauce, including reduced sodium. Steak sauce. Fish sauce. Oyster sauce. Cocktail sauce. Horseradish. Ketchup and mustard. Meat flavorings and tenderizers. Bouillon cubes. Hot sauce. Tabasco sauce. Marinades. Taco seasonings. Relishes. Fats and Oils Butter, stick margarine, lard, shortening, ghee, and bacon fat. Coconut, palm kernel, or palm oils. Regular salad dressings. Other Pickles and olives. Salted popcorn and pretzels. The items listed above may not be a complete list of foods and beverages to avoid. Contact your dietitian for more information. WHERE CAN I FIND MORE INFORMATION? National Heart, [employer], [employer] Institute: www.nhlbi.nih.gov/health/health-topics/topics/dash/ Document Released: 02/08/2011 Document Revised: 07/06/2013 Document Reviewed: 12/24/2012 ExitCare Patient Information 2015 [employer]. This information is not intended to replace advice given to you by your health care provider. Make sure you discuss any questions you have with your health care provider.  

## 2014-08-17 NOTE — Progress Notes (Signed)
MRN: [id number] Name: [name]  Sex: male Age: 64 y.o. DOB: 07-Dec-1950  Allergies: Mobic  Chief Complaint  Patient presents with  . Follow-up    HPI: Patient is 64 y.o. male who has to of diabetes hypertension hyperlipidemia GERD, patient comes today requesting refill on his medications, as per patient he has not taken his blood pressure medication today, he denies any headache dizziness chest and shortness of breath, today his hemoglobin A1c is 8.7%, he denies any hypoglycemic symptoms, currently taking metformin and Glucotrol.patient denies smoking cigarettes.  Past Medical History  Diagnosis Date  . Hypertension   . Coronary artery disease   . Hepatitis C   . High cholesterol   . Arthritis   . Hepatitis   . COPD (chronic obstructive pulmonary disease)   . Shortness of breath dyspnea   . Asthma   . Diabetes mellitus     type 2    Past Surgical History  Procedure Laterality Date  . Esophagogastroduodenoscopy  02/09/2011    Procedure: ESOPHAGOGASTRODUODENOSCOPY (EGD);  Surgeon: Missy Sabins, MD;  Location: [facility] ENDOSCOPY;  Service: Endoscopy;  Laterality: N/A;  . Joint replacement  10/22/2009    knee      Medication List       This list is accurate as of: 08/17/14 12:21 PM.  Always use your most recent med list.               albuterol (2.5 MG/3ML) 0.083% nebulizer solution  Commonly known as:  PROVENTIL  Take 3 mLs (2.5 mg total) by nebulization every 6 (six) hours as needed for wheezing or shortness of breath.     aspirin EC 81 MG tablet  Take 1 tablet (81 mg total) by mouth daily.     atorvastatin 10 MG tablet  Commonly known as:  LIPITOR  Take 1 tablet (10 mg total) by mouth daily at 6 PM.     beclomethasone 80 MCG/ACT inhaler  Commonly known as:  QVAR  Inhale 2 puffs into the lungs 2 (two) times daily.     cholecalciferol 1000 UNITS tablet  Commonly known as:  VITAMIN D  Take 1 tablet (1,000 Units total) by mouth daily.     cilostazol 100  MG tablet  Commonly known as:  PLETAL  Take 100 mg by mouth 2 (two) times daily.     CRESTOR 20 MG tablet  Generic drug:  rosuvastatin  TAKE 1 TABLET EVERY DAY     dextromethorphan-guaiFENesin 30-600 MG per 12 hr tablet  Commonly known as:  MUCINEX DM  Take 1 tablet by mouth 2 (two) times daily as needed for cough.     doxycycline 100 MG tablet  Commonly known as:  VIBRA-TABS  Take 1 tablet (100 mg total) by mouth every 12 (twelve) hours.     gabapentin 100 MG capsule  Commonly known as:  NEURONTIN  TAKE 1 CAPSULE THREE TIMES DAILY     glipiZIDE 10 MG tablet  Commonly known as:  GLUCOTROL  TAKE 1 TABLET TWICE DAILY BEFORE MEALS     hydrOXYzine 25 MG tablet  Commonly known as:  ATARAX/VISTARIL  Take 1 tablet (25 mg total) by mouth every 8 (eight) hours as needed for anxiety.     lisinopril 40 MG tablet  Commonly known as:  PRINIVIL,ZESTRIL  Take 1 tablet (40 mg total) by mouth daily.     metFORMIN 1000 MG tablet  Commonly known as:  GLUCOPHAGE  TAKE 1 TABLET TWICE DAILY  WITH A MEAL     omeprazole 40 MG capsule  Commonly known as:  PRILOSEC  Take 1 capsule (40 mg total) by mouth daily.     predniSONE 20 MG tablet  Commonly known as:  DELTASONE  Take 1 tablet (20 mg total) by mouth daily with breakfast.     traZODone 50 MG tablet  Commonly known as:  DESYREL  Take 1 tablet (50 mg total) by mouth at bedtime as needed for sleep.        Meds ordered this encounter  Medications  . atorvastatin (LIPITOR) 10 MG tablet    Sig: Take 1 tablet (10 mg total) by mouth daily at 6 PM.    Dispense:  30 tablet    Refill:  1  . gabapentin (NEURONTIN) 100 MG capsule    Sig: TAKE 1 CAPSULE THREE TIMES DAILY    Dispense:  90 capsule    Refill:  1    Patient will need appointment with PCP for additional refills  . glipiZIDE (GLUCOTROL) 10 MG tablet    Sig: TAKE 1 TABLET TWICE DAILY BEFORE MEALS    Dispense:  60 tablet    Refill:  1    Patient will need appointment with PCP  for additional refills  . hydrOXYzine (ATARAX/VISTARIL) 25 MG tablet    Sig: Take 1 tablet (25 mg total) by mouth every 8 (eight) hours as needed for anxiety.    Dispense:  30 tablet    Refill:  0  . lisinopril (PRINIVIL,ZESTRIL) 40 MG tablet    Sig: Take 1 tablet (40 mg total) by mouth daily.    Dispense:  30 tablet    Refill:  1    Patient will need appointment with PCP for additional refills  . metFORMIN (GLUCOPHAGE) 1000 MG tablet    Sig: TAKE 1 TABLET TWICE DAILY WITH A MEAL    Dispense:  60 tablet    Refill:  0    Patient will need appointment with PCP for additional refills  . omeprazole (PRILOSEC) 40 MG capsule    Sig: Take 1 capsule (40 mg total) by mouth daily.    Dispense:  30 capsule    Refill:  1    Patient will need appointment with PCP for additional refills    Immunization History  Administered Date(s) Administered  . Influenza Split 02/10/2011  . Influenza, Seasonal, Injecte, Preservative Fre 12/19/2012  . Influenza,inj,Quad PF,36+ Mos 12/02/2013  . Pneumococcal Polysaccharide-23 12/02/2013    Family History  Problem Relation Age of Onset  . Stroke Mother   . Diabetes Mother   . Hypertension Mother   . Hyperlipidemia Mother   . Heart disease Father   . Hypertension Father   . Heart attack Father     History  Substance Use Topics  . Smoking status: Former Smoker -- 1.50 packs/day for 30 years    Types: Cigarettes    Quit date: 03/19/2013  . Smokeless tobacco: Never Used  . 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  Eyes: EOM are normal. Pupils are equal, round, and reactive to light.  Cardiovascular: Normal rate and regular rhythm.   Pulmonary/Chest: Breath sounds normal. No respiratory distress.  He has no wheezes. He has no  rales.  Musculoskeletal: He exhibits no edema.    CBC    Component Value Date/Time   WBC 9.6 01/13/2014 0114   RBC 4.11* 01/13/2014 0114   HGB 11.7* 01/13/2014 0114   HCT 35.0* 01/13/2014 0114   PLT 231 01/13/2014 0114   MCV 85.2 01/13/2014 0114   LYMPHSABS 3.9 12/01/2013 0045   MONOABS 0.5 12/01/2013 0045   EOSABS 0.7 12/01/2013 0045   BASOSABS 0.0 12/01/2013 0045    CMP     Component Value Date/Time   NA 133* 01/13/2014 0114   K 4.6 01/13/2014 0114   CL 97 01/13/2014 0114   CO2 23 01/13/2014 0114   GLUCOSE 362* 01/13/2014 0114   BUN 22 01/13/2014 0114   CREATININE 0.79 01/13/2014 0114   CREATININE 0.78 12/30/2013 1753   CALCIUM 8.3* 01/13/2014 0114   PROT 7.8 01/12/2014 0720   ALBUMIN 4.0 01/12/2014 0720   AST 73* 01/12/2014 0720   ALT 95* 01/12/2014 0720   ALKPHOS 70 01/12/2014 0720   BILITOT 0.6 01/12/2014 0720   GFRNONAA >90 01/13/2014 0114   GFRNONAA >89 12/30/2013 1753   GFRAA >90 01/13/2014 0114   GFRAA >89 12/30/2013 1753    Lab Results  Component Value Date/Time   CHOL 135 03/17/2013 03:29 PM    Lab Results  Component Value Date/Time   HGBA1C 8.70 08/17/2014 11:28 AM   HGBA1C 8.6* 12/01/2013 08:30 AM    Lab Results  Component Value Date/Time   AST 73* 01/12/2014 07:20 AM    Assessment and Plan  Other specified diabetes mellitus without complications - Plan:  Results for orders placed or performed in visit on 08/17/14  Glucose (CBG)  Result Value Ref Range   POC Glucose 182.0 (A) 70 - 99 mg/dl  HgB A1c  Result Value Ref Range   Hemoglobin A1C 8.70    Diabetes is still uncontrolled, advised patient for diabetes meal planning, continue with metformin thousand milligram twice a day, have increased the dose of Glucotrol to 10 mg twice a day, repeat A1c in 3 months, HgB A1c, gabapentin (NEURONTIN) 100 MG capsule, glipiZIDE (GLUCOTROL) 10 MG tablet, metFORMIN (GLUCOPHAGE) 1000 MG tablet  Essential hypertension, benign - Plan: advised patient  for DASH diet continue withlisinopril (PRINIVIL,ZESTRIL) 40 MG tablet  Hyperlipidemia - Plan:currently patient is on  atorvastatin (LIPITOR) 10 MG tablet, fasting lipid panel in 3 months  Gastroesophageal reflux disease, esophagitis presence not specified - Plan: /time modification, critomeprazole (PRILOSEC) 40 MG capsule    This note has been created with Surveyor, quantity. Any transcriptional errors are unintentional.    Lorayne Marek, MD

## 2014-08-17 NOTE — Progress Notes (Signed)
Patient here for follow up on his diabetes And for medication refills Patient requesting a thirty day supply because as of next  Month He will be seeing a new provider

## 2014-08-24 ENCOUNTER — Encounter (HOSPITAL_COMMUNITY): Payer: Self-pay | Admitting: Emergency Medicine

## 2014-08-24 ENCOUNTER — Emergency Department (HOSPITAL_COMMUNITY)
Admission: EM | Admit: 2014-08-24 | Discharge: 2014-08-24 | Disposition: A | Discharge status: Home / Self Care | Payer: Commercial Managed Care - HMO | Attending: Emergency Medicine | Admitting: Emergency Medicine

## 2014-08-24 ENCOUNTER — Telehealth: Payer: Self-pay | Admitting: Internal Medicine

## 2014-08-24 DIAGNOSIS — M1990 Unspecified osteoarthritis, unspecified site: Secondary | ICD-10-CM | POA: Diagnosis not present

## 2014-08-24 DIAGNOSIS — E780 Pure hypercholesterolemia: Secondary | ICD-10-CM | POA: Insufficient documentation

## 2014-08-24 DIAGNOSIS — I2510 Atherosclerotic heart disease of native coronary artery without angina pectoris: Secondary | ICD-10-CM | POA: Insufficient documentation

## 2014-08-24 DIAGNOSIS — E119 Type 2 diabetes mellitus without complications: Secondary | ICD-10-CM | POA: Insufficient documentation

## 2014-08-24 DIAGNOSIS — I10 Essential (primary) hypertension: Secondary | ICD-10-CM | POA: Diagnosis not present

## 2014-08-24 DIAGNOSIS — Z79899 Other long term (current) drug therapy: Secondary | ICD-10-CM | POA: Insufficient documentation

## 2014-08-24 DIAGNOSIS — Z87891 Personal history of nicotine dependence: Secondary | ICD-10-CM | POA: Insufficient documentation

## 2014-08-24 DIAGNOSIS — Z7952 Long term (current) use of systemic steroids: Secondary | ICD-10-CM | POA: Insufficient documentation

## 2014-08-24 DIAGNOSIS — L309 Dermatitis, unspecified: Secondary | ICD-10-CM | POA: Diagnosis not present

## 2014-08-24 DIAGNOSIS — J449 Chronic obstructive pulmonary disease, unspecified: Secondary | ICD-10-CM | POA: Insufficient documentation

## 2014-08-24 DIAGNOSIS — Z7982 Long term (current) use of aspirin: Secondary | ICD-10-CM | POA: Diagnosis not present

## 2014-08-24 DIAGNOSIS — R21 Rash and other nonspecific skin eruption: Secondary | ICD-10-CM | POA: Diagnosis present

## 2014-08-24 DIAGNOSIS — Z8719 Personal history of other diseases of the digestive system: Secondary | ICD-10-CM | POA: Diagnosis not present

## 2014-08-24 DIAGNOSIS — Z8619 Personal history of other infectious and parasitic diseases: Secondary | ICD-10-CM | POA: Diagnosis not present

## 2014-08-24 DIAGNOSIS — L259 Unspecified contact dermatitis, unspecified cause: Secondary | ICD-10-CM | POA: Insufficient documentation

## 2014-08-24 MED ORDER — DIPHENHYDRAMINE HCL 25 MG PO CAPS
25.0000 mg | ORAL_CAPSULE | Freq: Once | ORAL | Status: AC
  Administered 2014-08-24: 25 mg via ORAL
  Filled 2014-08-24: qty 1

## 2014-08-24 MED ORDER — FAMOTIDINE 20 MG PO TABS
20.0000 mg | ORAL_TABLET | Freq: Once | ORAL | Status: AC
  Administered 2014-08-24: 20 mg via ORAL
  Filled 2014-08-24: qty 1

## 2014-08-24 MED ORDER — HYDROXYZINE HCL 25 MG PO TABS: 25.0000 mg | ORAL_TABLET | Freq: Four times a day (QID) | ORAL | Status: DC

## 2014-08-24 MED ORDER — PREDNISONE 10 MG (21) PO TBPK: 10.0000 mg | ORAL_TABLET | Freq: Every day | ORAL | Status: DC

## 2014-08-24 MED ORDER — METHYLPREDNISOLONE SODIUM SUCC 125 MG IJ SOLR
125.0000 mg | Freq: Once | INTRAMUSCULAR | Status: AC
  Administered 2014-08-24: 125 mg via INTRAMUSCULAR
  Filled 2014-08-24: qty 2

## 2014-08-24 MED ORDER — METHYLPREDNISOLONE SODIUM SUCC 125 MG IJ SOLR: 125.0000 mg | Freq: Once | INTRAMUSCULAR | Status: DC

## 2014-08-24 NOTE — Telephone Encounter (Signed)
Patient has come in today to request a medication refill for Crestor; patient is on the way to hospital with an unknown rash; please f/u with patient about request

## 2014-08-24 NOTE — Discharge Instructions (Signed)
Hydrate your skin with thick creams or ointments, such as Eucerin, CeraVe, even petroleum jelly.  Sparingly apply hydrocortisone cream to itchy areas of skin that are NOT broken. Take the steroid taper pack as prescribed and finish all medicines.  Continue to use benadryl or atarax (hydroxyzine) for itch relief.    Contact Dermatitis Contact dermatitis is a reaction to certain substances that touch the skin. Contact dermatitis can be either irritant contact dermatitis or allergic contact dermatitis. Irritant contact dermatitis does not require previous exposure to the substance for a reaction to occur.Allergic contact dermatitis only occurs if you have been exposed to the substance before. Upon a repeat exposure, your body reacts to the substance.  CAUSES  Many substances can cause contact dermatitis. Irritant dermatitis is most commonly caused by repeated exposure to mildly irritating substances, such as:  Makeup.  Soaps.  Detergents.  Bleaches.  Acids.  Metal salts, such as nickel. Allergic contact dermatitis is most commonly caused by exposure to:  Poisonous plants.  Chemicals (deodorants, shampoos).  Jewelry.  Latex.  Neomycin in triple antibiotic cream.  Preservatives in products, including clothing. SYMPTOMS  The area of skin that is exposed may develop:  Dryness or flaking.  Redness.  Cracks.  Itching.  Pain or a burning sensation.  Blisters. With allergic contact dermatitis, there may also be swelling in areas such as the eyelids, mouth, or genitals.  DIAGNOSIS  Your caregiver can usually tell what the problem is by doing a physical exam. In cases where the cause is uncertain and an allergic contact dermatitis is suspected, a patch skin test may be performed to help determine the cause of your dermatitis. TREATMENT Treatment includes protecting the skin from further contact with the irritating substance by avoiding that substance if possible. Barrier creams,  powders, and gloves may be helpful. Your caregiver may also recommend:  Steroid creams or ointments applied 2 times daily. For best results, soak the rash area in cool water for 20 minutes. Then apply the medicine. Cover the area with a plastic wrap. You can store the steroid cream in the refrigerator for a "chilly" effect on your rash. That may decrease itching. Oral steroid medicines may be needed in more severe cases.  Antibiotics or antibacterial ointments if a skin infection is present.  Antihistamine lotion or an antihistamine taken by mouth to ease itching.  Lubricants to keep moisture in your skin.  Burow's solution to reduce redness and soreness or to dry a weeping rash. Mix one packet or tablet of solution in 2 cups cool water. Dip a clean washcloth in the mixture, wring it out a bit, and put it on the affected area. Leave the cloth in place for 30 minutes. Do this as often as possible throughout the day.  Taking several cornstarch or baking soda baths daily if the area is too large to cover with a washcloth. Harsh chemicals, such as alkalis or acids, can cause skin damage that is like a burn. You should flush your skin for 15 to 20 minutes with cold water after such an exposure. You should also seek immediate medical care after exposure. Bandages (dressings), antibiotics, and pain medicine may be needed for severely irritated skin.  HOME CARE INSTRUCTIONS  Avoid the substance that caused your reaction.  Keep the area of skin that is affected away from hot water, soap, sunlight, chemicals, acidic substances, or anything else that would irritate your skin.  Do not scratch the rash. Scratching may cause the rash to become  infected.  You may take cool baths to help stop the itching.  Only take over-the-counter or prescription medicines as directed by your caregiver.  See your caregiver for follow-up care as directed to make sure your skin is healing properly. SEEK MEDICAL CARE IF:     Your condition is not better after 3 days of treatment.  You seem to be getting worse.  You see signs of infection such as swelling, tenderness, redness, soreness, or warmth in the affected area.  You have any problems related to your medicines. Document Released: 02/17/2000 Document Revised: 05/14/2011 Document Reviewed: 07/25/2010 [facility] Patient Information 2015 [place], [place]. This information is not intended to replace advice given to you by your health care provider. Make sure you discuss any questions you have with your health care provider.  Eczema Eczema, also called atopic dermatitis, is a skin disorder that causes inflammation of the skin. It causes a red rash and dry, scaly skin. The skin becomes very itchy. Eczema is generally worse during the cooler winter months and often improves with the warmth of summer. Eczema usually starts showing signs in infancy. Some children outgrow eczema, but it may last through adulthood.  CAUSES  The exact cause of eczema is not known, but it appears to run in families. People with eczema often have a family history of eczema, allergies, asthma, or hay fever. Eczema is not contagious. Flare-ups of the condition may be caused by:   Contact with something you are sensitive or allergic to.   Stress. SIGNS AND SYMPTOMS  Dry, scaly skin.   Red, itchy rash.   Itchiness. This may occur before the skin rash and may be very intense.  DIAGNOSIS  The diagnosis of eczema is usually made based on symptoms and medical history. TREATMENT  Eczema cannot be cured, but symptoms usually can be controlled with treatment and other strategies. A treatment plan might include:  Controlling the itching and scratching.   Use over-the-counter antihistamines as directed for itching. This is especially useful at night when the itching tends to be worse.   Use over-the-counter steroid creams as directed for itching.   Avoid scratching. Scratching  makes the rash and itching worse. It may also result in a skin infection (impetigo) due to a break in the skin caused by scratching.   Keeping the skin well moisturized with creams every day. This will seal in moisture and help prevent dryness. Lotions that contain alcohol and water should be avoided because they can dry the skin.   Limiting exposure to things that you are sensitive or allergic to (allergens).   Recognizing situations that cause stress.   Developing a plan to manage stress.  HOME CARE INSTRUCTIONS   Only take over-the-counter or prescription medicines as directed by your health care provider.   Do not use anything on the skin without checking with your health care provider.   Keep baths or showers short (5 minutes) in warm (not hot) water. Use mild cleansers for bathing. These should be unscented. You may add nonperfumed bath oil to the bath water. It is best to avoid soap and bubble bath.   Immediately after a bath or shower, when the skin is still damp, apply a moisturizing ointment to the entire body. This ointment should be a petroleum ointment. This will seal in moisture and help prevent dryness. The thicker the ointment, the better. These should be unscented.   Keep fingernails cut short. Children with eczema may need to wear soft gloves or  mittens at night after applying an ointment.   Dress in clothes made of cotton or cotton blends. Dress lightly, because heat increases itching.   A child with eczema should stay away from anyone with fever blisters or cold sores. The virus that causes fever blisters (herpes simplex) can cause a serious skin infection in children with eczema. SEEK MEDICAL CARE IF:   Your itching interferes with sleep.   Your rash gets worse or is not better within 1 week after starting treatment.   You see pus or soft yellow scabs in the rash area.   You have a fever.   You have a rash flare-up after contact with someone who  has fever blisters.  Document Released: 02/17/2000 Document Revised: 12/10/2012 Document Reviewed: 09/22/2012 [facility] Patient Information 2015 [place], [place]. This information is not intended to replace advice given to you by your health care provider. Make sure you discuss any questions you have with your health care provider.

## 2014-08-24 NOTE — ED Provider Notes (Signed)
CSN: [id number]     Arrival date & time 08/24/14  1052 History  This chart was scribed for non-physician practitioner Delsa Grana, PA-C working with [name], MD by Hilda Lias, ED Scribe. This patient was seen in room TR07C/TR07C and the patient's care was started at 11:50 AM.     Chief Complaint  Patient presents with  . Rash   The history is provided by the patient. No language interpreter was used.    HPI Comments: [name] is a 64 y.o. male who presents to the Emergency Department complaining of a worsening, itching, erythematous generalized rash that has been a chronic issue, but worse over the last couple weeks. He has been seen multiple times for similar complaint, and was unable to follow up with dermatology, which his pcp has referred him to several times.  He has an appointment in august, but lately his itching has become so severe that he cannot get more than an hour or two of sleep.  He has been treating it with benadryl, but he has minimal relief.  Chart review show prior treatment with short steroid tapers and hydroxyzine.  Pt complains of a history of extremely sensitive skin, but denies any eczema or asthma history.  He has not changed any medication recently, tried new food, or changed detergents or slept anywhere different.  There is no one who he lives or is in contact with who has similar sx.  He currently denies any CP, SOB,, wheeze, scratchy throat, inability to swallow, N, V, headache, fever or chills.  Pt denies any rash or irritation in mouth or on face, palms of hands, or soles of feet. Pt states he has used a cream called Sabna for his rash without improvement.  Has had worsening dryness of skin.  Past Medical History  Diagnosis Date  . Hypertension   . Coronary artery disease   . Hepatitis C   . High cholesterol   . Arthritis   . Hepatitis   . COPD (chronic obstructive pulmonary disease)   . Shortness of breath dyspnea   . Asthma   . Diabetes mellitus      type 2   Past Surgical History  Procedure Laterality Date  . Esophagogastroduodenoscopy  02/09/2011    Procedure: ESOPHAGOGASTRODUODENOSCOPY (EGD);  Surgeon: Missy Sabins, MD;  Location: [facility] ENDOSCOPY;  Service: Endoscopy;  Laterality: N/A;  . Joint replacement  10/22/2009    knee   Family History  Problem Relation Age of Onset  . Stroke Mother   . Diabetes Mother   . Hypertension Mother   . Hyperlipidemia Mother   . Heart disease Father   . Hypertension Father   . Heart attack Father    History  Substance Use Topics  . Smoking status: Former Smoker -- 1.50 packs/day for 30 years    Types: Cigarettes    Quit date: 03/19/2013  . Smokeless tobacco: Never Used  . 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  Gastrointestinal: Negative for abdominal pain.  Skin: Positive for color change and rash.  Neurological: Negative for headaches.    Allergies  Mobic  Home Medications   Prior to Admission medications   Medication Sig Start Date End Date Taking? Authorizing Provider  albuterol (PROVENTIL) (2.5 MG/3ML) 0.083% nebulizer solution Take 3 mLs (2.5 mg total) by nebulization every 6 (six) hours as needed for wheezing or shortness  of breath. 12/04/13   Kinnie Feil, MD  aspirin EC 81 MG tablet Take 1 tablet (81 mg total) by mouth daily. 08/31/13   Lorayne Marek, MD  atorvastatin (LIPITOR) 10 MG tablet Take 1 tablet (10 mg total) by mouth daily at 6 PM. 08/17/14   Lorayne Marek, MD  beclomethasone (QVAR) 80 MCG/ACT inhaler Inhale 2 puffs into the lungs 2 (two) times daily. 12/04/13   Kinnie Feil, MD  cholecalciferol (VITAMIN D) 1000 UNITS tablet Take 1 tablet (1,000 Units total) by mouth daily. 08/20/12   Robbie Lis, MD  cilostazol (PLETAL) 100 MG tablet Take 100 mg by mouth 2 (two) times daily.    Historical Provider, MD  CRESTOR 20 MG tablet TAKE 1 TABLET EVERY DAY 07/02/14   Tresa Garter, MD  dextromethorphan-guaiFENesin (MUCINEX DM) 30-600 MG per 12 hr tablet Take 1 tablet by mouth 2 (two) times daily as needed for cough. Patient not taking: Reported on 03/31/2014 01/20/14   Lorayne Marek, MD  doxycycline (VIBRA-TABS) 100 MG tablet Take 1 tablet (100 mg total) by mouth every 12 (twelve) hours. Patient not taking: Reported on 03/31/2014 01/13/14   Timmothy Euler, MD  gabapentin (NEURONTIN) 100 MG capsule TAKE 1 CAPSULE THREE TIMES DAILY 08/17/14   Lorayne Marek, MD  glipiZIDE (GLUCOTROL) 10 MG tablet TAKE 1 TABLET TWICE DAILY BEFORE MEALS 08/17/14   Lorayne Marek, MD  hydrOXYzine (ATARAX/VISTARIL) 25 MG tablet Take 1 tablet (25 mg total) by mouth every 8 (eight) hours as needed for anxiety. 08/17/14   Lorayne Marek, MD  lisinopril (PRINIVIL,ZESTRIL) 40 MG tablet Take 1 tablet (40 mg total) by mouth daily. 08/17/14   Lorayne Marek, MD  metFORMIN (GLUCOPHAGE) 1000 MG tablet TAKE 1 TABLET TWICE DAILY WITH A MEAL 08/17/14   Lorayne Marek, MD  omeprazole (PRILOSEC) 40 MG capsule Take 1 capsule (40 mg total) by mouth daily. 08/17/14   Lorayne Marek, MD  predniSONE (DELTASONE) 20 MG tablet Take 1 tablet (20 mg total) by mouth daily with breakfast. 03/31/14   Brayton Caves, PA-C  traZODone (DESYREL) 50 MG tablet Take 1 tablet (50 mg total) by mouth at bedtime as needed for sleep. Patient not taking: Reported on 03/31/2014 01/13/14   Timmothy Euler, MD   BP 136/73 mmHg  Pulse 105  Temp(Src) 98.3 F (36.8 C) (Oral)  Resp 20  Ht 5\' 6"  (1.676 m)  Wt 174 lb (78.926 kg)  BMI 28.10 kg/m2  SpO2 99% Physical Exam  Constitutional: He is oriented to person, place, and time. He appears well-developed and well-nourished. No distress.  HENT:  Head: Normocephalic and atraumatic.  Right Ear: External ear normal.  Left Ear: External ear normal.  Nose: Nose normal.  Mouth/Throat: Uvula is midline, oropharynx is clear and moist and mucous membranes are normal. No oral lesions. No  oropharyngeal exudate, posterior oropharyngeal edema or posterior oropharyngeal erythema.  Eyes: Conjunctivae and EOM are normal. Pupils are equal, round, and reactive to light. Right eye exhibits no discharge. Left eye exhibits no discharge. No scleral icterus.  Neck: Normal range of motion. Neck supple.  Cardiovascular: Normal rate and regular rhythm.  Exam reveals no gallop and no friction rub.   No murmur heard. Pulmonary/Chest: Effort normal and breath sounds normal. No accessory muscle usage. No tachypnea. No respiratory distress. He has no decreased breath sounds. He has no wheezes. He has no rhonchi. He has no rales. He exhibits no tenderness.  Abdominal: He exhibits no distension.  Musculoskeletal:  Normal range of motion. He exhibits no edema.  Neurological: He is alert and oriented to person, place, and time.  Skin: Skin is warm and dry. Rash noted. No bruising, no ecchymosis, no laceration and no petechiae noted. Rash is not papular, not nodular, not pustular, not vesicular and not urticarial. He is not diaphoretic. There is erythema. No cyanosis. No pallor. Nails show no clubbing.  Confluent erythematous erosions/macules of upper and lower extremities and abdomen.  Diffuse excoriations, multiple raw-appearing lesions, but no active bleeding, or draining.  No pustules, edema or warmth None on his head, palms, feet, or back  Psychiatric: He has a normal mood and affect.  Nursing note and vitals reviewed.  ED Course  Procedures (including critical care time)  DIAGNOSTIC STUDIES: Oxygen Saturation is 99% on room air, normal by my interpretation.    COORDINATION OF CARE: 11:56 AM Discussed treatment plan with pt at bedside and pt agreed to plan.   Labs Review Labs Reviewed - No data to display  Imaging Review No results found.   EKG Interpretation None      MDM   Final diagnoses:  None   Diffuse rash of entire upper and lower extremities and abdomen, sparing back,  neck, face, hands and feet.  Chart review shows a very long history of this same rash.  His extremities are covered in hyperpigmented scars of same size as current lesions.  Although there is extensive excoriations, I see no signs of a secondary infection that would require treatment with abx.  He appears very dry, and some of his rash resembles an exacerbation of atopic dermatitis.  It does not resemble a contact dermatitis or an allergic reaction.  Pt was encouraged to follow-up with dermatology.  He was given information for eczema products that will help with his dry skin.  I prescribed a long steroid taper, hoping to avoid any rebound dermatitis, and gave another prescription of atarax.  I personally performed the services described in this documentation, which was scribed in my presence. The recorded information has been reviewed, edited and is accurate.     Delsa Grana, PA-C 08/26/14 1634  [name], MD 08/27/14 1504

## 2014-08-24 NOTE — ED Notes (Addendum)
Pt has itching, papular rash x 1.5 weeks. [facility] PCP 1 week ago and was referred to dermatology. Has not yet seen Derm.[name] Has been using Sabna cream without improvement. Pt states he has "very sensitive skin...has chronic 'hives'".

## 2014-08-24 NOTE — ED Notes (Signed)
Declined W/C at D/C and was escorted to lobby by RN. 

## 2014-09-01 ENCOUNTER — Other Ambulatory Visit: Payer: Self-pay

## 2014-09-01 MED ORDER — ROSUVASTATIN CALCIUM 20 MG PO TABS: 20.0000 mg | ORAL_TABLET | Freq: Every day | ORAL | Status: DC

## 2014-09-01 NOTE — Progress Notes (Unsigned)
Patient called requesting a refill on his crestor [facility]

## 2014-09-03 ENCOUNTER — Other Ambulatory Visit: Payer: Self-pay

## 2014-09-03 MED ORDER — ROSUVASTATIN CALCIUM 20 MG PO TABS: 20.0000 mg | ORAL_TABLET | Freq: Every day | ORAL | Status: DC

## 2014-09-13 NOTE — Telephone Encounter (Signed)
Pt has been called into the pharmacy.

## 2014-09-15 ENCOUNTER — Other Ambulatory Visit: Payer: Self-pay | Admitting: Internal Medicine

## 2014-09-21 ENCOUNTER — Ambulatory Visit: Payer: Commercial Managed Care - HMO | Admitting: Internal Medicine

## 2014-09-27 ENCOUNTER — Ambulatory Visit (INDEPENDENT_AMBULATORY_CARE_PROVIDER_SITE_OTHER): Payer: Commercial Managed Care - HMO | Admitting: Internal Medicine

## 2014-09-27 ENCOUNTER — Encounter: Payer: Self-pay | Admitting: Internal Medicine

## 2014-09-27 ENCOUNTER — Other Ambulatory Visit: Payer: Self-pay | Admitting: Internal Medicine

## 2014-09-27 VITALS — BP 148/90 | HR 114 | Temp 98.6°F | Resp 18 | Ht 66.0 in | Wt 167.1 lb

## 2014-09-27 DIAGNOSIS — IMO0002 Reserved for concepts with insufficient information to code with codable children: Secondary | ICD-10-CM

## 2014-09-27 DIAGNOSIS — J441 Chronic obstructive pulmonary disease with (acute) exacerbation: Secondary | ICD-10-CM | POA: Diagnosis not present

## 2014-09-27 DIAGNOSIS — H269 Unspecified cataract: Secondary | ICD-10-CM

## 2014-09-27 DIAGNOSIS — I10 Essential (primary) hypertension: Secondary | ICD-10-CM

## 2014-09-27 DIAGNOSIS — E785 Hyperlipidemia, unspecified: Secondary | ICD-10-CM

## 2014-09-27 DIAGNOSIS — J449 Chronic obstructive pulmonary disease, unspecified: Secondary | ICD-10-CM

## 2014-09-27 DIAGNOSIS — K219 Gastro-esophageal reflux disease without esophagitis: Secondary | ICD-10-CM | POA: Diagnosis not present

## 2014-09-27 DIAGNOSIS — L989 Disorder of the skin and subcutaneous tissue, unspecified: Secondary | ICD-10-CM

## 2014-09-27 DIAGNOSIS — B1920 Unspecified viral hepatitis C without hepatic coma: Secondary | ICD-10-CM

## 2014-09-27 DIAGNOSIS — E139 Other specified diabetes mellitus without complications: Secondary | ICD-10-CM

## 2014-09-27 DIAGNOSIS — E1165 Type 2 diabetes mellitus with hyperglycemia: Secondary | ICD-10-CM

## 2014-09-27 MED ORDER — METFORMIN HCL 1000 MG PO TABS: ORAL_TABLET | ORAL | Status: DC

## 2014-09-27 MED ORDER — GLIPIZIDE 10 MG PO TABS: ORAL_TABLET | ORAL | Status: DC

## 2014-09-27 MED ORDER — ROSUVASTATIN CALCIUM 20 MG PO TABS: 20.0000 mg | ORAL_TABLET | Freq: Every day | ORAL | Status: DC

## 2014-09-27 MED ORDER — HYDROXYZINE HCL 25 MG PO TABS: 25.0000 mg | ORAL_TABLET | Freq: Four times a day (QID) | ORAL | Status: DC

## 2014-09-27 MED ORDER — LOSARTAN POTASSIUM-HCTZ 100-12.5 MG PO TABS: 1.0000 | ORAL_TABLET | Freq: Every day | ORAL | Status: DC

## 2014-09-27 MED ORDER — ALBUTEROL SULFATE (2.5 MG/3ML) 0.083% IN NEBU: 2.5000 mg | INHALATION_SOLUTION | Freq: Four times a day (QID) | RESPIRATORY_TRACT | Status: DC | PRN

## 2014-09-27 MED ORDER — TRAZODONE HCL 50 MG PO TABS: 50.0000 mg | ORAL_TABLET | Freq: Every evening | ORAL | Status: DC | PRN

## 2014-09-27 MED ORDER — OMEPRAZOLE 40 MG PO CPDR: 40.0000 mg | DELAYED_RELEASE_CAPSULE | Freq: Every day | ORAL | Status: DC

## 2014-09-27 MED ORDER — TIOTROPIUM BROMIDE MONOHYDRATE 18 MCG IN CAPS: 18.0000 ug | ORAL_CAPSULE | Freq: Every day | RESPIRATORY_TRACT | Status: DC

## 2014-09-27 NOTE — Progress Notes (Signed)
   Subjective:    Patient ID: [name], male    DOB: 1951/02/19, 64 y.o.   MRN: [id number]  HPI The patient is a 64 YO man who is coming in new for SOB and cough. He has been struggling with the cough for some time. Former smoker quit 1.5 years ago. Has an inhaler which he is supposed to use twice daily but has not used in some time as he did not feel it was working. He has albuterol nebulization and uses that several times a week. This seems to help and make him breathe better. He is able to walk for an hour a day most days. At one time he was told he had COPD and another time they told him that he did not have COPD. The cough is stable. He had done a course of prednisone which helped a lot and antibiotics but those are gone now. No wheezing.   PMH, [facility], social history reviewed and updated.   Review of Systems  Constitutional: Negative for fever, activity change, appetite change, fatigue and unexpected weight change.  HENT: Negative.   Eyes: Negative.   Respiratory: Positive for cough and shortness of breath. Negative for chest tightness and wheezing.   Cardiovascular: Negative for chest pain, palpitations and leg swelling.  Gastrointestinal: Negative for nausea, abdominal pain, diarrhea, constipation and abdominal distention.  Musculoskeletal: Negative.   Skin: Negative.   Neurological: Negative.   Psychiatric/Behavioral: Negative.       Objective:   Physical Exam  Constitutional: He is oriented to person, place, and time. He appears well-developed and well-nourished.  Overweight  HENT:  Head: Normocephalic and atraumatic.  Eyes: EOM are normal.  Neck: Normal range of motion.  Cardiovascular: Normal rate and regular rhythm.   Pulmonary/Chest: Effort normal and breath sounds normal. No respiratory distress. He has no wheezes. He has no rales.  Abdominal: Soft. He exhibits no distension. There is no tenderness. There is no rebound.  Musculoskeletal: He exhibits no edema.    Neurological: He is alert and oriented to person, place, and time. Coordination normal.  Skin: Skin is warm and dry.  Psychiatric: He has a normal mood and affect.   Filed Vitals:   09/27/14 0955  BP: 148/90  Pulse: 114  Temp: 98.6 F (37 C)  TempSrc: Oral  Resp: 18  Height: 5\' 6"  (1.676 m)  Weight: 167 lb 1.9 oz (75.805 kg)  SpO2: 98%      Assessment & Plan:

## 2014-09-27 NOTE — Progress Notes (Signed)
Pre visit review using our clinic review tool, if applicable. No additional management support is needed unless otherwise documented below in the visit note. 

## 2014-09-27 NOTE — Patient Instructions (Signed)
We have sent in the refill of the crestor, the albuterol, omeprazole, metformin, glipizide, trazodone, hydroxyzine.   We would like you to stop taking the lisinopril when you run out and get instead losartan. This could help reduce your cough switching this medicine although it may take 1-2 months to get better.   We have sent in spiriva for the breathing instead of the qvar. This is a medicine you take once a day to help keep the breathing good all the time.   You can stop taking the gabapentin as it is not helping anything.   We would like to see you back in about 2 months to check on the labs and sugars.   Diabetes and Standards of Medical Care Diabetes is complicated. You may find that your diabetes team includes a dietitian, nurse, diabetes educator, eye doctor, and more. To help everyone know what is going on and to help you get the care you deserve, the following schedule of care was developed to help keep you on track. Below are the tests, exams, vaccines, medicines, education, and plans you will need. HbA1c test This test shows how well you have controlled your glucose over the past 2-3 months. It is used to see if your diabetes management plan needs to be adjusted.   It is performed at least 2 times a year if you are meeting treatment goals.  It is performed 4 times a year if therapy has changed or if you are not meeting treatment goals. Blood pressure test  This test is performed at every routine medical visit. The goal is less than 140/90 mm Hg for most people, but 130/80 mm Hg in some cases. Ask your health care provider about your goal. Dental exam  Follow up with the dentist regularly. Eye exam  If you are diagnosed with type 1 diabetes as a child, get an exam upon reaching the age of 57 years or older and have had diabetes for 3-5 years. Yearly eye exams are recommended after that initial eye exam.  If you are diagnosed with type 1 diabetes as an adult, get an exam within  5 years of diagnosis and then yearly.  If you are diagnosed with type 2 diabetes, get an exam as soon as possible after the diagnosis and then yearly. Foot care exam  Visual foot exams are performed at every routine medical visit. The exams check for cuts, injuries, or other problems with the feet.  A comprehensive foot exam should be done yearly. This includes visual inspection as well as assessing foot pulses and testing for loss of sensation.  Check your feet nightly for cuts, injuries, or other problems with your feet. Tell your health care provider if anything is not healing. Kidney function test (urine microalbumin)  This test is performed once a year.  Type 1 diabetes: The first test is performed 5 years after diagnosis.  Type 2 diabetes: The first test is performed at the time of diagnosis.  A serum creatinine and estimated glomerular filtration rate (eGFR) test is done once a year to assess the level of chronic kidney disease (CKD), if present. Lipid profile (cholesterol, HDL, LDL, triglycerides)  Performed every 5 years for most people.  The goal for LDL is less than 100 mg/dL. If you are at high risk, the goal is less than 70 mg/dL.  The goal for HDL is 40 mg/dL-50 mg/dL for men and 50 mg/dL-60 mg/dL for women. An HDL cholesterol of 60 mg/dL or higher gives  some protection against heart disease.  The goal for triglycerides is less than 150 mg/dL. Influenza vaccine, pneumococcal vaccine, and hepatitis B vaccine  The influenza vaccine is recommended yearly.  It is recommended that people with diabetes who are over 50 years old get the pneumonia vaccine. In some cases, two separate shots may be given. Ask your health care provider if your pneumonia vaccination is up to date.  The hepatitis B vaccine is also recommended for adults with diabetes. Diabetes self-management education  Education is recommended at diagnosis and ongoing as needed. Treatment plan  Your  treatment plan is reviewed at every medical visit. Document Released: 12/17/2008 Document Revised: 07/06/2013 Document Reviewed: 07/22/2012 [facility] Patient Information 2015 [place], [place]. This information is not intended to replace advice given to you by your health care provider. Make sure you discuss any questions you have with your health care provider.

## 2014-09-28 ENCOUNTER — Encounter: Payer: Self-pay | Admitting: Internal Medicine

## 2014-09-28 NOTE — Assessment & Plan Note (Signed)
Changing lisinopril to losartan/hctz for uncontrolled hypertension. Switching ACE-I to ARB since persistent cough. Reviewed BMP.

## 2014-09-28 NOTE — Assessment & Plan Note (Signed)
Taking crestor 20 mg daily. Has not been taking consistently.

## 2014-09-28 NOTE — Assessment & Plan Note (Signed)
Will need to review records (he states had PFTs done in the past) but clinically seems the diagnosis. Not in flare today but not on maintenance medicine. States taking his medicine but 2 month supply written 8 months ago. Talked to him about the need to take spiriva daily to help his breathing and use the albuterol when needed. No indication for antibiotics or prednisone at today's visit. He is no longer smoking for the last 1-2 years. Reminded not to resume.

## 2014-09-28 NOTE — Assessment & Plan Note (Signed)
He has been able to increase glipizide (done by last PCP about 1 month ago). Still taking metformin as well. Switching ACE-I to ARB due to cough. Too early to recheck HgA1c. Last HgA1c is not at goal (8.7). Talked to him about the importance of good diet and avoiding sugary foods and carbs and working on exercise. Information about diabetes given to him today since his level of knowledge is not clear.

## 2014-09-28 NOTE — Assessment & Plan Note (Addendum)
Unclear to me whether this diagnosis is accurate. Hep C Ab reactive about 4 years ago. He states that it is and will check labs at next visit and try to verify and check virus burden.

## 2014-09-28 NOTE — Assessment & Plan Note (Signed)
He was taken off cilostazol for some reason. He has visit scheduled already with vascular this fall. Still having claudication and possibly worsened. Taking baby aspirin daily and talked to him about that. No ulcerations or wounds on his lower extremities.

## 2014-10-05 ENCOUNTER — Emergency Department (HOSPITAL_COMMUNITY)
Admission: EM | Admit: 2014-10-05 | Discharge: 2014-10-05 | Disposition: A | Discharge status: Home / Self Care | Payer: Commercial Managed Care - HMO | Attending: Emergency Medicine | Admitting: Emergency Medicine

## 2014-10-05 ENCOUNTER — Encounter (HOSPITAL_COMMUNITY): Payer: Self-pay | Admitting: Vascular Surgery

## 2014-10-05 ENCOUNTER — Emergency Department (HOSPITAL_COMMUNITY): Payer: Commercial Managed Care - HMO

## 2014-10-05 DIAGNOSIS — R000 Tachycardia, unspecified: Secondary | ICD-10-CM | POA: Insufficient documentation

## 2014-10-05 DIAGNOSIS — E119 Type 2 diabetes mellitus without complications: Secondary | ICD-10-CM | POA: Insufficient documentation

## 2014-10-05 DIAGNOSIS — Z7982 Long term (current) use of aspirin: Secondary | ICD-10-CM | POA: Insufficient documentation

## 2014-10-05 DIAGNOSIS — Z8619 Personal history of other infectious and parasitic diseases: Secondary | ICD-10-CM | POA: Insufficient documentation

## 2014-10-05 DIAGNOSIS — I10 Essential (primary) hypertension: Secondary | ICD-10-CM | POA: Diagnosis not present

## 2014-10-05 DIAGNOSIS — Z79899 Other long term (current) drug therapy: Secondary | ICD-10-CM | POA: Diagnosis not present

## 2014-10-05 DIAGNOSIS — E780 Pure hypercholesterolemia: Secondary | ICD-10-CM | POA: Insufficient documentation

## 2014-10-05 DIAGNOSIS — I2510 Atherosclerotic heart disease of native coronary artery without angina pectoris: Secondary | ICD-10-CM | POA: Diagnosis not present

## 2014-10-05 DIAGNOSIS — J45909 Unspecified asthma, uncomplicated: Secondary | ICD-10-CM | POA: Diagnosis not present

## 2014-10-05 DIAGNOSIS — J441 Chronic obstructive pulmonary disease with (acute) exacerbation: Secondary | ICD-10-CM | POA: Diagnosis not present

## 2014-10-05 DIAGNOSIS — M1990 Unspecified osteoarthritis, unspecified site: Secondary | ICD-10-CM | POA: Insufficient documentation

## 2014-10-05 DIAGNOSIS — J449 Chronic obstructive pulmonary disease, unspecified: Secondary | ICD-10-CM | POA: Diagnosis not present

## 2014-10-05 DIAGNOSIS — Z87891 Personal history of nicotine dependence: Secondary | ICD-10-CM | POA: Insufficient documentation

## 2014-10-05 DIAGNOSIS — R0789 Other chest pain: Secondary | ICD-10-CM | POA: Diagnosis not present

## 2014-10-05 DIAGNOSIS — R0602 Shortness of breath: Secondary | ICD-10-CM | POA: Diagnosis not present

## 2014-10-05 LAB — BASIC METABOLIC PANEL
Anion gap: 13 (ref 5–15)
BUN: 10 mg/dL (ref 6–20)
CO2: 24 mmol/L (ref 22–32)
CREATININE: 0.99 mg/dL (ref 0.61–1.24)
Calcium: 9.3 mg/dL (ref 8.9–10.3)
Chloride: 101 mmol/L (ref 101–111)
GFR calc non Af Amer: >60 mL/min (ref >60)
GLUCOSE: 165 mg/dL — AB (ref 65–99)
POTASSIUM: 4.3 mmol/L (ref 3.5–5.1)
Sodium: 138 mmol/L (ref 135–145)

## 2014-10-05 LAB — CBC
HCT: 43.4 % (ref 39.0–52.0)
Hemoglobin: 14.7 g/dL (ref 13.0–17.0)
MCH: 28.4 pg (ref 26.0–34.0)
MCHC: 33.9 g/dL (ref 30.0–36.0)
MCV: 83.9 fL (ref 78.0–100.0)
PLATELETS: 234 10*3/uL (ref 150–400)
RBC: 5.17 MIL/uL (ref 4.22–5.81)
RDW: 13 % (ref 11.5–15.5)
WBC: 6.7 10*3/uL (ref 4.0–10.5)

## 2014-10-05 LAB — I-STAT TROPONIN, ED: Troponin i, poc: 0 ng/mL (ref 0.00–0.08)

## 2014-10-05 LAB — BRAIN NATRIURETIC PEPTIDE: B Natriuretic Peptide: 20.2 pg/mL (ref 0.0–100.0)

## 2014-10-05 MED ORDER — AZITHROMYCIN 250 MG PO TABS
500.0000 mg | ORAL_TABLET | Freq: Once | ORAL | Status: AC
  Administered 2014-10-05: 500 mg via ORAL
  Filled 2014-10-05: qty 2

## 2014-10-05 MED ORDER — ALBUTEROL SULFATE (2.5 MG/3ML) 0.083% IN NEBU
5.0000 mg | INHALATION_SOLUTION | Freq: Once | RESPIRATORY_TRACT | Status: AC
  Administered 2014-10-05: 5 mg via RESPIRATORY_TRACT
  Filled 2014-10-05: qty 6

## 2014-10-05 MED ORDER — PREDNISONE 10 MG (21) PO TBPK: 10.0000 mg | ORAL_TABLET | Freq: Every day | ORAL | Status: DC

## 2014-10-05 MED ORDER — AZITHROMYCIN 250 MG PO TABS: 250.0000 mg | ORAL_TABLET | Freq: Every day | ORAL | Status: DC

## 2014-10-05 MED ORDER — IPRATROPIUM BROMIDE 0.02 % IN SOLN
0.5000 mg | Freq: Once | RESPIRATORY_TRACT | Status: AC
  Administered 2014-10-05: 0.5 mg via RESPIRATORY_TRACT
  Filled 2014-10-05: qty 2.5

## 2014-10-05 MED ORDER — ALBUTEROL SULFATE HFA 108 (90 BASE) MCG/ACT IN AERS
2.0000 | INHALATION_SPRAY | Freq: Once | RESPIRATORY_TRACT | Status: AC
  Administered 2014-10-05: 2 via RESPIRATORY_TRACT
  Filled 2014-10-05: qty 6.7

## 2014-10-05 NOTE — ED Notes (Signed)
Pt ambulated with no assistance. Pt did c/o SOB while ambulating. SpO2 stayed 96-97% throughout. HR was slightly elevated at a rate of 118-124 bpm. Pt tolerated well.

## 2014-10-05 NOTE — Discharge Instructions (Signed)
Use albuterol inhaler every 4-6 hours as needed for cough and wheezing. Take prednisone beginning tomorrow as you were treated today with an IV dose. Take azithromycin beginning tomorrow as you were given the first dose in the emergency department today.  Chronic Obstructive Pulmonary Disease Exacerbation Chronic obstructive pulmonary disease (COPD) is a common lung condition in which airflow from the lungs is limited. COPD is a general term that can be used to describe many different lung problems that limit airflow, including chronic bronchitis and emphysema. COPD exacerbations are episodes when breathing symptoms become much worse and require extra treatment. Without treatment, COPD exacerbations can be life threatening, and frequent COPD exacerbations can cause further damage to your lungs. CAUSES   Respiratory infections.   Exposure to smoke.   Exposure to air pollution, chemical fumes, or dust. Sometimes there is no apparent cause or trigger. RISK FACTORS  Smoking cigarettes.  Older age.  Frequent prior COPD exacerbations. SIGNS AND SYMPTOMS   Increased coughing.   Increased thick spit (sputum) production.   Increased wheezing.   Increased shortness of breath.   Rapid breathing.   Chest tightness. DIAGNOSIS  Your medical history, a physical exam, and tests will help your health care provider make a diagnosis. Tests may include:  A chest X-ray.  Basic lab tests.  Sputum testing.  An arterial blood gas test. TREATMENT  Depending on the severity of your COPD exacerbation, you may need to be admitted to a hospital for treatment. Some of the treatments commonly used to treat COPD exacerbations are:   Antibiotic medicines.   Bronchodilators. These are drugs that expand the air passages. They may be given with an inhaler or nebulizer. Spacer devices may be needed to help improve drug delivery.  Corticosteroid medicines.  Supplemental oxygen therapy.  HOME  CARE INSTRUCTIONS   Do not smoke. Quitting smoking is very important to prevent COPD from getting worse and exacerbations from happening as often.  Avoid exposure to all substances that irritate the airway, especially to tobacco smoke.   If you were prescribed an antibiotic medicine, finish it all even if you start to feel better.  Take all medicines as directed by your health care provider.It is important to use correct technique with inhaled medicines.  Drink enough fluids to keep your urine clear or pale yellow (unless you have a medical condition that requires fluid restriction).  Use a cool mist vaporizer. This makes it easier to clear your chest when you cough.   If you have a home nebulizer and oxygen, continue to use them as directed.   Maintain all necessary vaccinations to prevent infections.   Exercise regularly.   Eat a healthy diet.   Keep all follow-up appointments as directed by your health care provider. SEEK IMMEDIATE MEDICAL CARE IF:  You have worsening shortness of breath.   You have trouble talking.   You have severe chest pain.  You have blood in your sputum.  You have a fever.  You have weakness, vomit repeatedly, or faint.   You feel confused.   You continue to get worse. MAKE SURE YOU:   Understand these instructions.  Will watch your condition.  Will get help right away if you are not doing well or get worse. Document Released: 12/17/2006 Document Revised: 07/06/2013 Document Reviewed: 10/24/2012 [facility] Patient Information 2015 [place], [place]. This information is not intended to replace advice given to you by your health care provider. Make sure you discuss any questions you have with your health  care provider.  

## 2014-10-05 NOTE — ED Provider Notes (Signed)
CSN: [id number]     Arrival date & time 10/05/14  1326 History   First MD Initiated Contact with Patient 10/05/14 1326     Chief Complaint  Patient presents with  . Shortness of Breath     (Consider location/radiation/quality/duration/timing/severity/associated sxs/prior Treatment) HPI Comments: 64 year old male with a past medical history of COPD, asthma, diabetes, CAD, hypertension, high cholesterol and hepatitis C presenting via EMS complaining of increased shortness of breath 2 weeks, worsening over the past 3 days. States this feels like a prior COPD exacerbation. He was seen by his PCP on 727 and was prescribed spiriva which she was unable to fill. Anytime he goes outside, his shortness of breath increases. He has been using his nebulizer very frequently and went through a whole box of solution in 3 days. Today, he tried to go outside, shortness of breath got so severe he had to call EMS. Admits to associated cough that is only mildly productive with clear mucus. Denies fevers. Reports chest pain with coughing only. EMS gave a total of 10 mg albuterol, 0.5 Atrovent and 125 Solu-Medrol. Quit smoking a year and a half ago.  Patient is a 64 y.o. male presenting with shortness of breath. The history is provided by the patient and the EMS personnel.  Shortness of Breath Associated symptoms: chest pain (with coughing only), cough and wheezing     Past Medical History  Diagnosis Date  . Hypertension   . Coronary artery disease   . Hepatitis C   . High cholesterol   . Arthritis   . Hepatitis   . COPD (chronic obstructive pulmonary disease)   . Shortness of breath dyspnea   . Asthma   . Diabetes mellitus     type 2   Past Surgical History  Procedure Laterality Date  . Esophagogastroduodenoscopy  02/09/2011    Procedure: ESOPHAGOGASTRODUODENOSCOPY (EGD);  Surgeon: Missy Sabins, MD;  Location: [facility] ENDOSCOPY;  Service: Endoscopy;  Laterality: N/A;  . Joint replacement  10/22/2009    knee    Family History  Problem Relation Age of Onset  . Stroke Mother   . Diabetes Mother   . Hypertension Mother   . Hyperlipidemia Mother   . Heart disease Father   . Hypertension Father   . Heart attack Father    History  Substance Use Topics  . Smoking status: Former Smoker -- 1.50 packs/day for 30 years    Types: Cigarettes    Quit date: 03/19/2013  . Smokeless tobacco: Never Used  . 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  Respiratory: Positive for cough, shortness of breath and wheezing.   Cardiovascular: Positive for chest pain (with coughing only).  All other systems reviewed and are negative.     Allergies  Mobic  Home Medications   Prior to Admission medications   Medication Sig Start Date End Date Taking? Authorizing Provider  albuterol (PROVENTIL) (2.5 MG/3ML) 0.083% nebulizer solution Take 3 mLs (2.5 mg total) by nebulization every 6 (six) hours as needed for wheezing or shortness of breath. 09/27/14  Yes Olga Millers, MD  aspirin EC 81 MG tablet Take 1 tablet (81 mg total) by mouth daily. 08/31/13  Yes Lorayne Marek, MD  cholecalciferol (VITAMIN D) 1000 UNITS tablet Take 1 tablet (1,000 Units total) by mouth daily. 08/20/12  Yes Robbie Lis, MD  cilostazol (PLETAL) 100 MG tablet Take 100 mg  by mouth 2 (two) times daily.   Yes Historical Provider, MD  glipiZIDE (GLUCOTROL) 10 MG tablet TAKE 1 TABLET TWICE DAILY BEFORE MEALS 09/27/14  Yes Olga Millers, MD  hydrOXYzine (ATARAX/VISTARIL) 25 MG tablet Take 1 tablet (25 mg total) by mouth every 6 (six) hours. 09/27/14  Yes Olga Millers, MD  Ibuprofen 200 MG CAPS Take 200 mg by mouth every 6 (six) hours as needed. pain 10/04/14  Yes Historical Provider, MD  losartan-hydrochlorothiazide (HYZAAR) 100-12.5 MG per tablet Take 1 tablet by mouth daily. 09/27/14  Yes Olga Millers, MD  metFORMIN (GLUCOPHAGE) 1000 MG tablet  TAKE 1 TABLET TWICE DAILY WITH MEALS (NEED MD APPOINTMENT) 09/27/14  Yes Olga Millers, MD  Multiple Vitamin (MULTIVITAMIN) tablet Take 1 tablet by mouth daily.   Yes Historical Provider, MD  omeprazole (PRILOSEC) 40 MG capsule Take 1 capsule (40 mg total) by mouth daily. 09/27/14  Yes Olga Millers, MD  rosuvastatin (CRESTOR) 20 MG tablet Take 1 tablet (20 mg total) by mouth daily. 09/27/14  Yes Olga Millers, MD  tiotropium (SPIRIVA HANDIHALER) 18 MCG inhalation capsule Place 1 capsule (18 mcg total) into inhaler and inhale daily. 09/27/14  Yes Olga Millers, MD  traZODone (DESYREL) 50 MG tablet Take 1 tablet (50 mg total) by mouth at bedtime as needed for sleep. 09/27/14  Yes Olga Millers, MD  azithromycin (ZITHROMAX) 250 MG tablet Take 1 tablet (250 mg total) by mouth daily. Take 1 tablet by mouth daily x 4 days. 10/05/14   Carman Ching, PA-C  gabapentin (NEURONTIN) 100 MG capsule TAKE 1 CAPSULE THREE TIMES DAILY Patient not taking: Reported on 10/05/2014 08/17/14   Lorayne Marek, MD  predniSONE (STERAPRED UNI-PAK 21 TAB) 10 MG (21) TBPK tablet Take 1 tablet (10 mg total) by mouth daily. Take 6 tabs by mouth daily  for 2 days, then 5 tabs for 2 days, then 4 tabs for 2 days, then 3 tabs for 2 days, 2 tabs for 2 days, then 1 tab by mouth daily for 2 days 10/05/14   Bailey Mech M Talaysha Freeberg, PA-C   BP 118/72 mmHg  Pulse 116  Temp(Src) 97.9 F (36.6 C) (Oral)  Resp 22  SpO2 96% Physical Exam  Constitutional: He is oriented to person, place, and time. He appears well-developed and well-nourished. No distress.  HENT:  Head: Normocephalic and atraumatic.  Mouth/Throat: Oropharynx is clear and moist.  Eyes: Conjunctivae and EOM are normal. Pupils are equal, round, and reactive to light.  Neck: Normal range of motion. Neck supple. No JVD present.  Cardiovascular: Regular rhythm, normal heart sounds and intact distal pulses.   Tachycardic.  Pulmonary/Chest: Effort normal. No respiratory  distress.  Diffuse inspiratory/expiratory wheezes BL.  Abdominal: Soft. Bowel sounds are normal. There is no tenderness.  Musculoskeletal: Normal range of motion. He exhibits no edema.  Neurological: He is alert and oriented to person, place, and time. He has normal strength. No sensory deficit.  Speech fluent, goal oriented. Moves extremities without ataxia. Equal grip strength bilateral.  Skin: Skin is warm and dry. He is not diaphoretic.  Psychiatric: He has a normal mood and affect. His behavior is normal.  Nursing note and vitals reviewed.   ED Course  Procedures (including critical care time) Labs Review Labs Reviewed  BASIC METABOLIC PANEL - Abnormal; Notable for the following:    Glucose, Bld 165 (*)    All other components within normal limits  CBC  BRAIN NATRIURETIC PEPTIDE  I-STAT  [place], ED    Imaging Review Dg Chest 2 View  10/05/2014   CLINICAL DATA:  Shortness of breath for 4 days. History of asthma, COPD, CAD, hepatitis, hypertension, diabetes, smoking.  EXAM: CHEST  2 VIEW  COMPARISON:  01/12/2014  FINDINGS: Heart size is normal. Lungs are well inflated but not hyperinflated. There is mild perihilar peribronchial thickening. No focal consolidations or pleural effusions. No pulmonary edema. Visualized osseous structures have a normal appearance.  IMPRESSION: 1. Bronchitic changes. 2.  No focal acute pulmonary abnormality.   Electronically Signed   By: Nolon Nations M.D.   On: 10/05/2014 14:32     EKG Interpretation   Date/Time:  Tuesday October 05 2014 13:31:05 EDT Ventricular Rate:  123 PR Interval:  130 QRS Duration: 85 QT Interval:  328 QTC Calculation: 469 R Axis:   71 Text Interpretation:  Sinus tachycardia No significant change since last  tracing Confirmed by Mingo Amber  MD, [place] ([id number]) on 10/05/2014 1:39:11 PM      MDM   Final diagnoses:  COPD exacerbation   Nontoxic appearing, NAD. Afebrile. Tachycardic on arrival. Had just received DuoNeb. O2  sat 98% on room air. Diffuse wheezes bilateral. Given Solu-Medrol by EMS. Workup in the ED negative. Chest x-ray showing bronchitic changes without acute finding. No leukocytosis. After receiving a second DuoNeb, breath sounds improved. Patient able to ambulate around the ED with O2 sat remaining at 96-97% on room air. Still with tachycardia. Doubt PE given presenting symptoms and sound of lungs. Patient reports he is feeling much better. Will treat with antibiotics given comorbidities. First dose of azithromycin given in the ED. Will discharge home with albuterol inhaler, Rx for azithromycin and prednisone pack. Discussed option of admission for observation with patient, he states he will go home. Patient is stable for discharge. F/u with PCP within 1-2 days. Return precautions given. Patient states understanding of treatment care plan and is agreeable.  Discussed with attending Dr. Mingo Amber who also evaluated patient and agrees with plan of care.  Carman Ching, PA-C 10/05/14 1601  Evelina Bucy, MD 10/05/14 [phone]

## 2014-10-05 NOTE — ED Notes (Signed)
Pt reports to the ED for eval of increasing SOB x 3 days. He has hx of COPD and has had to have multiple admissions in the past for the same. Pt was seen by his PCP on the 7/27 and was prescribed Spiriva but he has been unable to fill his prescription due to insufficient funds. He reports every time he goes outside his SOB becomes severe. He has been using his nebulizer tx more frequently and states he went through a whole box in 3 days. He ran out today and became SOB and had to call EMS. En route he received a total of 10 mg of Albuterol, 0.5 of Atrovent, and 125 mg of solumedrol. Expiratory wheezing noted throughout. Skin warm and clammy. Resp e/u at this time. Pt A&Ox4.

## 2014-10-07 ENCOUNTER — Telehealth: Payer: Self-pay | Admitting: Internal Medicine

## 2014-10-07 DIAGNOSIS — E119 Type 2 diabetes mellitus without complications: Secondary | ICD-10-CM | POA: Diagnosis not present

## 2014-10-07 DIAGNOSIS — H25813 Combined forms of age-related cataract, bilateral: Secondary | ICD-10-CM | POA: Diagnosis not present

## 2014-10-07 NOTE — Telephone Encounter (Signed)
Allie from [facility] eye [facility] called stated that she received a referral for this patient, she need officenotes.

## 2014-10-12 NOTE — Telephone Encounter (Signed)
Faxed notes

## 2014-10-25 ENCOUNTER — Telehealth: Payer: Self-pay | Admitting: *Deleted

## 2014-10-25 MED ORDER — METFORMIN HCL 1000 MG PO TABS: ORAL_TABLET | ORAL | Status: DC

## 2014-10-25 NOTE — Telephone Encounter (Signed)
Left msg on triage @ 9:17 am requesting refills on his metformin. Called pt back verified pharmacy sent rx to [facility]...[name]

## 2014-11-02 ENCOUNTER — Telehealth: Payer: Self-pay | Admitting: Internal Medicine

## 2014-11-02 DIAGNOSIS — R069 Unspecified abnormalities of breathing: Secondary | ICD-10-CM | POA: Diagnosis not present

## 2014-11-02 NOTE — Telephone Encounter (Signed)
Patient would like to know if he could get a portable oxygen tank, please advise

## 2014-11-02 NOTE — Telephone Encounter (Signed)
Left message for patient to call me back. 

## 2014-11-03 DIAGNOSIS — R069 Unspecified abnormalities of breathing: Secondary | ICD-10-CM | POA: Diagnosis not present

## 2014-11-03 NOTE — Telephone Encounter (Signed)
Pt returned your call.  

## 2014-11-09 ENCOUNTER — Emergency Department (HOSPITAL_COMMUNITY)
Admission: EM | Admit: 2014-11-09 | Discharge: 2014-11-09 | Disposition: A | Discharge status: Home / Self Care | Payer: Commercial Managed Care - HMO | Attending: Emergency Medicine | Admitting: Emergency Medicine

## 2014-11-09 ENCOUNTER — Encounter (HOSPITAL_COMMUNITY): Payer: Self-pay | Admitting: Emergency Medicine

## 2014-11-09 ENCOUNTER — Emergency Department (HOSPITAL_COMMUNITY): Payer: Commercial Managed Care - HMO

## 2014-11-09 ENCOUNTER — Telehealth: Payer: Self-pay | Admitting: *Deleted

## 2014-11-09 DIAGNOSIS — E782 Mixed hyperlipidemia: Secondary | ICD-10-CM | POA: Insufficient documentation

## 2014-11-09 DIAGNOSIS — Z79899 Other long term (current) drug therapy: Secondary | ICD-10-CM | POA: Insufficient documentation

## 2014-11-09 DIAGNOSIS — R079 Chest pain, unspecified: Secondary | ICD-10-CM | POA: Diagnosis not present

## 2014-11-09 DIAGNOSIS — M1990 Unspecified osteoarthritis, unspecified site: Secondary | ICD-10-CM | POA: Insufficient documentation

## 2014-11-09 DIAGNOSIS — Z87891 Personal history of nicotine dependence: Secondary | ICD-10-CM | POA: Diagnosis not present

## 2014-11-09 DIAGNOSIS — J45901 Unspecified asthma with (acute) exacerbation: Secondary | ICD-10-CM

## 2014-11-09 DIAGNOSIS — J441 Chronic obstructive pulmonary disease with (acute) exacerbation: Secondary | ICD-10-CM | POA: Diagnosis not present

## 2014-11-09 DIAGNOSIS — E119 Type 2 diabetes mellitus without complications: Secondary | ICD-10-CM | POA: Insufficient documentation

## 2014-11-09 DIAGNOSIS — Z8619 Personal history of other infectious and parasitic diseases: Secondary | ICD-10-CM | POA: Diagnosis not present

## 2014-11-09 DIAGNOSIS — I2510 Atherosclerotic heart disease of native coronary artery without angina pectoris: Secondary | ICD-10-CM | POA: Diagnosis not present

## 2014-11-09 DIAGNOSIS — R0602 Shortness of breath: Secondary | ICD-10-CM | POA: Diagnosis not present

## 2014-11-09 DIAGNOSIS — Z7982 Long term (current) use of aspirin: Secondary | ICD-10-CM | POA: Insufficient documentation

## 2014-11-09 DIAGNOSIS — R05 Cough: Secondary | ICD-10-CM | POA: Diagnosis not present

## 2014-11-09 DIAGNOSIS — I10 Essential (primary) hypertension: Secondary | ICD-10-CM | POA: Insufficient documentation

## 2014-11-09 LAB — COMPREHENSIVE METABOLIC PANEL
ALBUMIN: 3.7 g/dL (ref 3.5–5.0)
ALT: 133 U/L — ABNORMAL HIGH (ref 17–63)
ANION GAP: 9 (ref 5–15)
AST: 108 U/L — ABNORMAL HIGH (ref 15–41)
Alkaline Phosphatase: 49 U/L (ref 38–126)
BILIRUBIN TOTAL: 0.7 mg/dL (ref 0.3–1.2)
BUN: 18 mg/dL (ref 6–20)
CHLORIDE: 101 mmol/L (ref 101–111)
CO2: 26 mmol/L (ref 22–32)
Calcium: 9.2 mg/dL (ref 8.9–10.3)
Creatinine, Ser: 0.81 mg/dL (ref 0.61–1.24)
GFR calc Af Amer: >60 mL/min (ref >60)
GLUCOSE: 211 mg/dL — AB (ref 65–99)
POTASSIUM: 4.3 mmol/L (ref 3.5–5.1)
Sodium: 136 mmol/L (ref 135–145)
TOTAL PROTEIN: 6.7 g/dL (ref 6.5–8.1)

## 2014-11-09 LAB — I-STAT TROPONIN, ED: Troponin i, poc: 0.01 ng/mL (ref 0.00–0.08)

## 2014-11-09 LAB — CBC
HCT: 42.3 % (ref 39.0–52.0)
Hemoglobin: 13.9 g/dL (ref 13.0–17.0)
MCH: 27.9 pg (ref 26.0–34.0)
MCHC: 32.9 g/dL (ref 30.0–36.0)
MCV: 84.8 fL (ref 78.0–100.0)
Platelets: 250 10*3/uL (ref 150–400)
RBC: 4.99 MIL/uL (ref 4.22–5.81)
RDW: 13.6 % (ref 11.5–15.5)
WBC: 5 10*3/uL (ref 4.0–10.5)

## 2014-11-09 LAB — BRAIN NATRIURETIC PEPTIDE: B NATRIURETIC PEPTIDE 5: 21.8 pg/mL (ref 0.0–100.0)

## 2014-11-09 MED ORDER — GUAIFENESIN-CODEINE 100-10 MG/5ML PO SOLN: 5.0000 mL | Freq: Once | ORAL | Status: DC

## 2014-11-09 MED ORDER — IPRATROPIUM-ALBUTEROL 0.5-2.5 (3) MG/3ML IN SOLN
3.0000 mL | RESPIRATORY_TRACT | Status: DC
  Administered 2014-11-09: 3 mL via RESPIRATORY_TRACT

## 2014-11-09 MED ORDER — PREDNISONE 10 MG (21) PO TBPK: 10.0000 mg | ORAL_TABLET | Freq: Every day | ORAL | Status: DC

## 2014-11-09 MED ORDER — ALBUTEROL SULFATE HFA 108 (90 BASE) MCG/ACT IN AERS
2.0000 | INHALATION_SPRAY | Freq: Once | RESPIRATORY_TRACT | Status: AC
  Administered 2014-11-09: 2 via RESPIRATORY_TRACT
  Filled 2014-11-09: qty 6.7

## 2014-11-09 MED ORDER — IPRATROPIUM-ALBUTEROL 0.5-2.5 (3) MG/3ML IN SOLN
3.0000 mL | Freq: Once | RESPIRATORY_TRACT | Status: DC
  Filled 2014-11-09: qty 3

## 2014-11-09 MED ORDER — IPRATROPIUM-ALBUTEROL 0.5-2.5 (3) MG/3ML IN SOLN
3.0000 mL | Freq: Once | RESPIRATORY_TRACT | Status: AC
  Administered 2014-11-09: 3 mL via RESPIRATORY_TRACT
  Filled 2014-11-09: qty 3

## 2014-11-09 MED ORDER — GUAIFENESIN-CODEINE 100-10 MG/5ML PO SOLN
5.0000 mL | Freq: Once | ORAL | Status: AC
  Administered 2014-11-09: 5 mL via ORAL
  Filled 2014-11-09: qty 5

## 2014-11-09 NOTE — ED Provider Notes (Signed)
CSN: [id number]     Arrival date & time 11/09/14  5726 History   First MD Initiated Contact with Patient 11/09/14 [phone]     Chief Complaint  Patient presents with  . Shortness of Breath  . Chest Pain     (Consider location/radiation/quality/duration/timing/severity/associated sxs/prior Treatment) HPI [name] is a 64 y.o. male with history of hypertension, CAD, COPD, diabetes, hepatitis C, comes in for evaluation of shortness of breath. Patient reports for the past month or so he has had increased shortness of breath, productive white phlegm cough. He reports he has a appointment with his PCP on 9/9. Reports he was prescribed prednisone for 12 days and was on this medication 3 weeks ago which seemed to help his cough. The patient reports he takes albuterol at home, but has run out of this medication due to using it too often. Patient presents via EMS and received 125 mg by Medrol, 5 mg albuterol and 0.5 mg of Atrovent en route. Patient denies any other chest pain, except when coughing. Denies any numbness or weakness, nausea or vomiting, fevers or chills, hemoptysis, leg swelling, recent travel or surgeries, history of blood clot.  Past Medical History  Diagnosis Date  . Hypertension   . Coronary artery disease   . Hepatitis C   . High cholesterol   . Arthritis   . Hepatitis   . COPD (chronic obstructive pulmonary disease)   . Shortness of breath dyspnea   . Asthma   . Diabetes mellitus     type 2   Past Surgical History  Procedure Laterality Date  . Esophagogastroduodenoscopy  02/09/2011    Procedure: ESOPHAGOGASTRODUODENOSCOPY (EGD);  Surgeon: Missy Sabins, MD;  Location: [facility] ENDOSCOPY;  Service: Endoscopy;  Laterality: N/A;  . Joint replacement  10/22/2009    knee   Family History  Problem Relation Age of Onset  . Stroke Mother   . Diabetes Mother   . Hypertension Mother   . Hyperlipidemia Mother   . Heart disease Father   . Hypertension Father   . Heart attack Father     Social History  Substance Use Topics  . Smoking status: Former Smoker -- 1.50 packs/day for 30 years    Types: Cigarettes    Quit date: 03/19/2013  . Smokeless tobacco: Never Used  . 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 Authorizing Provider  albuterol (PROVENTIL HFA;VENTOLIN HFA) 108 (90 BASE) MCG/ACT inhaler Inhale 2 puffs into the lungs every 6 (six) hours as needed for wheezing or shortness of breath.   Yes Historical Provider, MD  albuterol (PROVENTIL) (2.5 MG/3ML) 0.083% nebulizer solution Take 3 mLs (2.5 mg total) by nebulization every 6 (six) hours as needed for wheezing or shortness of breath. 09/27/14  Yes Olga Millers, MD  aspirin EC 81 MG tablet Take 1 tablet (81 mg total) by mouth daily. 08/31/13  Yes Lorayne Marek, MD  cholecalciferol (VITAMIN D) 1000 UNITS tablet Take 1 tablet (1,000 Units total) by mouth daily. 08/20/12  Yes Robbie Lis, MD  cilostazol (PLETAL) 100 MG tablet Take 100 mg by mouth 2 (two) times daily.  Yes Historical Provider, MD  glipiZIDE (GLUCOTROL) 10 MG tablet TAKE 1 TABLET TWICE DAILY BEFORE MEALS 09/27/14  Yes Olga Millers, MD  hydrOXYzine (ATARAX/VISTARIL) 25 MG tablet Take 1 tablet (25 mg total) by mouth every 6 (six) hours. 09/27/14  Yes Olga Millers, MD  Ibuprofen 200 MG CAPS Take 200 mg by mouth every 6 (six) hours as needed. pain 10/04/14  Yes Historical Provider, MD  losartan-hydrochlorothiazide (HYZAAR) 100-12.5 MG per tablet Take 1 tablet by mouth daily. 09/27/14  Yes Olga Millers, MD  metFORMIN (GLUCOPHAGE) 1000 MG tablet TAKE 1 TABLET TWICE DAILY WITH MEALS (NEED MD APPOINTMENT) 10/25/14  Yes Olga Millers, MD  Multiple Vitamin (MULTIVITAMIN) tablet Take 1 tablet by mouth daily.   Yes Historical Provider, MD  omeprazole (PRILOSEC) 40 MG capsule Take 1 capsule (40 mg total) by mouth daily. 09/27/14  Yes Olga Millers, MD  rosuvastatin (CRESTOR) 20 MG tablet Take 1 tablet (20 mg total) by mouth daily. 09/27/14  Yes Olga Millers, MD  tiotropium (SPIRIVA HANDIHALER) 18 MCG inhalation capsule Place 1 capsule (18 mcg total) into inhaler and inhale daily. 09/27/14  Yes Olga Millers, MD  traZODone (DESYREL) 50 MG tablet Take 1 tablet (50 mg total) by mouth at bedtime as needed for sleep. 09/27/14  Yes Olga Millers, MD  azithromycin (ZITHROMAX) 250 MG tablet Take 1 tablet (250 mg total) by mouth daily. Take 1 tablet by mouth daily x 4 days. Patient not taking: Reported on 11/09/2014 10/05/14   Carman Ching, PA-C  gabapentin (NEURONTIN) 100 MG capsule TAKE 1 CAPSULE THREE TIMES DAILY Patient not taking: Reported on 10/05/2014 08/17/14   Lorayne Marek, MD  guaiFENesin-codeine 100-10 MG/5ML syrup Take 5 mLs by mouth once. 11/09/14   Comer Locket, PA-C  predniSONE (STERAPRED UNI-PAK 21 TAB) 10 MG (21) TBPK tablet Take 1 tablet (10 mg total) by mouth daily. Take 6 tabs by mouth daily  for 2 days, then 5 tabs for 2 days, then 4 tabs for 2 days, then 3 tabs for 2 days, 2 tabs for 2 days, then 1 tab by mouth daily for 2 days 11/09/14   Comer Locket, PA-C   BP 140/82 mmHg  Pulse 99  Temp(Src) 98 F (36.7 C) (Oral)  Resp 31  SpO2 94% Physical Exam  Constitutional: He is oriented to person, place, and time. He appears well-developed and well-nourished.  HENT:  Head: Normocephalic and atraumatic.  Mouth/Throat: Oropharynx is clear and moist.  Eyes: Conjunctivae are normal. Pupils are equal, round, and reactive to light. Right eye exhibits no discharge. Left eye exhibits no discharge. No scleral icterus.  Neck: Neck supple.  Cardiovascular: Normal rate, regular rhythm and normal heart  sounds.   Pulmonary/Chest: Effort normal and breath sounds normal. No respiratory distress.  Patient with diffuse wheezing in all fields. No tachypnea, dyspnea or hypoxia.  Abdominal: Soft. There is no tenderness.  Musculoskeletal: He exhibits no tenderness.  Neurological: He is alert and oriented to person, place, and time.  Cranial Nerves II-XII grossly intact  Skin: Skin is warm and dry. No rash noted.  Psychiatric: He has a normal mood and affect.  Nursing note and vitals reviewed.   ED Course  Procedures (including critical care time) Labs Review Labs Reviewed  COMPREHENSIVE METABOLIC PANEL - Abnormal; Notable for the following:    Glucose, Bld 211 (*)    AST 108 (*)    ALT 133 (*)    All other components within  normal limits  CBC  BRAIN NATRIURETIC PEPTIDE  I-STAT TROPOININ, ED    Imaging Review Dg Chest 2 View  11/09/2014   CLINICAL DATA:  Shortness of breath, cough, and chest pain.  EXAM: CHEST  2 VIEW  COMPARISON:  10/05/2014  FINDINGS: Normal heart size and mediastinal contours. Chronic bronchitic markings. No acute infiltrate or edema. No effusion or pneumothorax. No acute osseous findings.  IMPRESSION: Chronic bronchitic markings without acute superimposed finding.   Electronically Signed   By: Monte Fantasia M.D.   On: 11/09/2014 09:56   I have personally reviewed and evaluated these images and lab results as part of my medical decision-making.   EKG Interpretation   Date/Time:  Tuesday November 09 2014 09:00:29 EDT Ventricular Rate:  108 PR Interval:  132 QRS Duration: 76 QT Interval:  337 QTC Calculation: 452 R Axis:   86 Text Interpretation:  Sinus tachycardia Consider right atrial enlargement  Borderline right axis deviation No significant change since last tracing  Confirmed by POLLINA  MD, CHRISTOPHER [phone]) on 11/09/2014 9:12:03 AM     Meds given in ED:  Medications  ipratropium-albuterol (DUONEB) 0.5-2.5 (3) MG/3ML nebulizer solution 3 mL (0 mLs  Nebulization Duplicate 10/03/17 1478)  albuterol (PROVENTIL HFA;VENTOLIN HFA) 108 (90 BASE) MCG/ACT inhaler 2 puff (not administered)  guaiFENesin-codeine 100-10 MG/5ML solution 5 mL (5 mLs Oral Given 11/09/14 1056)  ipratropium-albuterol (DUONEB) 0.5-2.5 (3) MG/3ML nebulizer solution 3 mL (3 mLs Nebulization Given 11/09/14 1057)    New Prescriptions   GUAIFENESIN-CODEINE 100-10 MG/5ML SYRUP    Take 5 mLs by mouth once.   Filed Vitals:   11/09/14 1011 11/09/14 1015 11/09/14 1100 11/09/14 1145  BP: 134/65 144/78 133/82 140/82  Pulse: 101 100 93 99  Temp:      TempSrc:      Resp: 27 24 13 31   SpO2: 93% 93% 100% 94%    MDM  Vitals stable  -afebrile, maintaining oxygen saturations on room air. Pt resting comfortably in ED. PE--mild to moderate wheezing on initial lung exam. Patient given DuoNeb breathing treatment 2, patient states he feels better, but is still coughing. Wheezing has improved. Labwork-labs are baseline and noncontributory. Negative troponin, BNP 21.8, no leukocytosis. EKG shows sinus tachycardia, likely secondary to albuterol treatment given by EMS. Imaging-chest x-ray shows chronic bronchitic markings without any acute superimposed findings.  Patient with likely asthma/COPD exacerbation. Symptoms experience today are typical of patient's previous asthma exacerbations. Low suspicion for pneumonia, PE or other emergent cardiopulmonary pathology. Treated with IV steroid, breathing treatments. Symptoms improved, cough is better with antitussives. Patient states he feels much better now than he did last night. Will DC with albuterol inhaler, oral steroids as well as cough medicines. Patient is stable for discharge to follow-up with PCP for regularly scheduled appointment on Friday. I discussed all relevant lab findings and imaging results with pt and they verbalized understanding. Discussed f/u with PCP within 48 hrs and return precautions, pt very amenable to plan. Prior to patient  discharge, I discussed and reviewed this case with Dr. Betsey Holiday, who agrees with above plan.   Final diagnoses:  Asthma exacerbation with COPD (chronic obstructive pulmonary disease)        Comer Locket, PA-C 11/09/14 [place], MD 11/09/14 1157

## 2014-11-09 NOTE — ED Notes (Addendum)
EMS - Patient coming from home with c/o of SOB x1 month and left sided chest pain since yesterday.  Patient is out of his Albuterol because he has been using it more than prescribed.  Coughing with white flem.  Given 125mg  Solumedrol, 5mg  Albuterol and .5 mg of Atrovent with EMS.

## 2014-11-09 NOTE — Telephone Encounter (Signed)
Pharmacy called related to Rx: guaiFENesin-codeine 100-10 MG/5ML syrup 11/09/14 -- Comer Locket, PA-C Take 5 mLs by mouth once....NCM clarified with EDP to change Rx to: once daily.

## 2014-11-09 NOTE — Discharge Instructions (Signed)
You were evaluated in the ED today for your shortness of breath. This is likely due to your asthma/COPD. Please take your medications as prescribed. Use your inhaler as needed for wheezing. Follow up with your doctor for regularly scheduled appointment on Friday. Return to ED for new or worsening symptoms.  Asthma, Acute Bronchospasm Acute bronchospasm caused by asthma is also referred to as an asthma attack. Bronchospasm means your air passages become narrowed. The narrowing is caused by inflammation and tightening of the muscles in the air tubes (bronchi) in your lungs. This can make it hard to breathe or cause you to wheeze and cough. CAUSES Possible triggers are:  Animal dander from the skin, hair, or feathers of animals.  Dust mites contained in house dust.  Cockroaches.  Pollen from trees or grass.  Mold.  Cigarette or tobacco smoke.  Air pollutants such as dust, household cleaners, hair sprays, aerosol sprays, paint fumes, strong chemicals, or strong odors.  Cold air or weather changes. Cold air may trigger inflammation. Winds increase molds and pollens in the air.  Strong emotions such as crying or laughing hard.  Stress.  Certain medicines such as aspirin or beta-blockers.  Sulfites in foods and drinks, such as dried fruits and wine.  Infections or inflammatory conditions, such as a flu, cold, or inflammation of the nasal membranes (rhinitis).  Gastroesophageal reflux disease (GERD). GERD is a condition where stomach acid backs up into your esophagus.  Exercise or strenuous activity. SIGNS AND SYMPTOMS   Wheezing.  Excessive coughing, particularly at night.  Chest tightness.  Shortness of breath. DIAGNOSIS  Your health care provider will ask you about your medical history and perform a physical exam. A chest X-ray or blood testing may be performed to look for other causes of your symptoms or other conditions that may have triggered your asthma  attack. TREATMENT  Treatment is aimed at reducing inflammation and opening up the airways in your lungs. Most asthma attacks are treated with inhaled medicines. These include quick relief or rescue medicines (such as bronchodilators) and controller medicines (such as inhaled corticosteroids). These medicines are sometimes given through an inhaler or a nebulizer. Systemic steroid medicine taken by mouth or given through an IV tube also can be used to reduce the inflammation when an attack is moderate or severe. Antibiotic medicines are only used if a bacterial infection is present.  HOME CARE INSTRUCTIONS   Rest.  Drink plenty of liquids. This helps the mucus to remain thin and be easily coughed up. Only use caffeine in moderation and do not use alcohol until you have recovered from your illness.  Do not smoke. Avoid being exposed to secondhand smoke.  You play a critical role in keeping yourself in good health. Avoid exposure to things that cause you to wheeze or to have breathing problems.  Keep your medicines up-to-date and available. Carefully follow your health care provider's treatment plan.  Take your medicine exactly as prescribed.  When pollen or pollution is bad, keep windows closed and use an air conditioner or go to places with air conditioning.  Asthma requires careful medical care. See your health care provider for a follow-up as advised. If you are more than [redacted] weeks pregnant and you were prescribed any new medicines, let your obstetrician know about the visit and how you are doing. Follow up with your health care provider as directed.  After you have recovered from your asthma attack, make an appointment with your outpatient doctor to talk about  ways to reduce the likelihood of future attacks. If you do not have a doctor who manages your asthma, make an appointment with a primary care doctor to discuss your asthma. SEEK IMMEDIATE MEDICAL CARE IF:   You are getting  worse.  You have trouble breathing. If severe, call your local emergency services (911 in the [place]  You develop chest pain or discomfort.  You are vomiting.  You are not able to keep fluids down.  You are coughing up yellow, green, brown, or bloody sputum.  You have a fever and your symptoms suddenly get worse.  You have trouble swallowing. MAKE SURE YOU:   Understand these instructions.  Will watch your condition.  Will get help right away if you are not doing well or get worse. Document Released: 06/06/2006 Document Revised: 02/24/2013 Document Reviewed: 08/27/2012 [facility] Patient Information 2015 [place], [place]. This information is not intended to replace advice given to you by your health care provider. Make sure you discuss any questions you have with your health care provider.

## 2014-11-09 NOTE — ED Notes (Signed)
Pt report productive cough and sob x 1 month. Has appt with his primary care on 11/12/14. Pt reports he was on prednisone for 12 days 3 weeks ago  which helped and then the cough started back up when he was finished.

## 2014-11-09 NOTE — ED Notes (Signed)
Patient transported to X-ray 

## 2014-11-11 ENCOUNTER — Telehealth: Payer: Self-pay | Admitting: Internal Medicine

## 2014-11-11 NOTE — Telephone Encounter (Signed)
[facility]    --------------------------------------------------------------------------------   Patient Name: ELESTER APODACA  Gender: Male  DOB: 07-Nov-1950   Age: 64 Y 2 M 12 D  Return Phone Number: [phone] (Primary)  Address:     City/State/Zip:  [place]     Client [facility] - [facility]  Client Site [facility] - Day  Physician [place], [place] Type Call  Call Type Triage / Clinical  Caller Name Hondo  Relationship To Patient Self  Appointment Disposition EMR Caller Not Reached  Return Phone Number [phone] (Primary)  Chief Complaint Blood Sugar High  Initial Comment Caller states he was seen in the ER on Tuesday for Bronchitis, and COPD. He has been checking his sugar. Sugar is 141 now has questions. He is going to be on Predisone.       Nurse Assessment       Guidelines          Guideline Title Affirmed Question Affirmed Notes Nurse Date/Time (Eastern Time)       Disp. Time Eilene Ghazi Time) Disposition Final User    11/11/2014 2:47:11 PM Attempt made - message left   Ronnald Ramp RN, Miranda    11/11/2014 3:00:18 PM Send To Clinical Follow Up Marina Goodell, RN, Miranda    11/11/2014 3:05:02 PM Attempt made - no message left   Amalia Hailey, RN, Lenna Sciara      11/11/2014 4:06:54 PM FINAL ATTEMPT MADE - no message left Yes Amalia Hailey, RN, Melissa              After Care Instructions Given        Call Event Type User Date / Time Description        --------------------------------------------------------------------------------

## 2014-11-12 ENCOUNTER — Ambulatory Visit: Payer: Commercial Managed Care - HMO | Admitting: Internal Medicine

## 2014-11-30 ENCOUNTER — Ambulatory Visit: Payer: Commercial Managed Care - HMO | Admitting: Internal Medicine

## 2014-12-09 ENCOUNTER — Other Ambulatory Visit: Payer: Self-pay | Admitting: Physician Assistant

## 2014-12-09 DIAGNOSIS — L309 Dermatitis, unspecified: Secondary | ICD-10-CM | POA: Diagnosis not present

## 2014-12-09 DIAGNOSIS — D489 Neoplasm of uncertain behavior, unspecified: Secondary | ICD-10-CM | POA: Diagnosis not present

## 2014-12-09 DIAGNOSIS — T148 Other injury of unspecified body region: Secondary | ICD-10-CM | POA: Diagnosis not present

## 2014-12-15 ENCOUNTER — Ambulatory Visit (INDEPENDENT_AMBULATORY_CARE_PROVIDER_SITE_OTHER): Payer: Commercial Managed Care - HMO | Admitting: Internal Medicine

## 2014-12-15 ENCOUNTER — Encounter: Payer: Self-pay | Admitting: Internal Medicine

## 2014-12-15 ENCOUNTER — Other Ambulatory Visit (INDEPENDENT_AMBULATORY_CARE_PROVIDER_SITE_OTHER): Payer: Commercial Managed Care - HMO

## 2014-12-15 VITALS — BP 130/70 | HR 116 | Temp 98.2°F | Resp 24 | Ht 66.0 in | Wt 170.8 lb

## 2014-12-15 DIAGNOSIS — J441 Chronic obstructive pulmonary disease with (acute) exacerbation: Secondary | ICD-10-CM | POA: Diagnosis not present

## 2014-12-15 DIAGNOSIS — E1165 Type 2 diabetes mellitus with hyperglycemia: Secondary | ICD-10-CM | POA: Diagnosis not present

## 2014-12-15 DIAGNOSIS — IMO0001 Reserved for inherently not codable concepts without codable children: Secondary | ICD-10-CM

## 2014-12-15 LAB — COMPREHENSIVE METABOLIC PANEL
ALBUMIN: 4.3 g/dL (ref 3.5–5.2)
ALK PHOS: 53 U/L (ref 39–117)
ALT: 141 U/L — ABNORMAL HIGH (ref 0–53)
AST: 120 U/L — ABNORMAL HIGH (ref 0–37)
BUN: 16 mg/dL (ref 6–23)
CALCIUM: 9.6 mg/dL (ref 8.4–10.5)
CHLORIDE: 97 meq/L (ref 96–112)
CO2: 31 mEq/L (ref 19–32)
Creatinine, Ser: 0.91 mg/dL (ref 0.40–1.50)
GFR: 108.12 mL/min (ref >60.00)
Glucose, Bld: 167 mg/dL — ABNORMAL HIGH (ref 70–99)
POTASSIUM: 4.3 meq/L (ref 3.5–5.1)
SODIUM: 137 meq/L (ref 135–145)
TOTAL PROTEIN: 7.6 g/dL (ref 6.0–8.3)
Total Bilirubin: 0.7 mg/dL (ref 0.2–1.2)

## 2014-12-15 LAB — LIPID PANEL
CHOLESTEROL: 150 mg/dL (ref 0–200)
HDL: 47.6 mg/dL (ref >39.00)
LDL Cholesterol: 83 mg/dL (ref 0–99)
NonHDL: 102.6
TRIGLYCERIDES: 96 mg/dL (ref 0.0–149.0)
Total CHOL/HDL Ratio: 3
VLDL: 19.2 mg/dL (ref 0.0–40.0)

## 2014-12-15 LAB — HEMOGLOBIN A1C: Hgb A1c MFr Bld: 10.4 % — ABNORMAL HIGH (ref 4.6–6.5)

## 2014-12-15 MED ORDER — LANCETS MISC: Status: DC

## 2014-12-15 MED ORDER — GLUCOSE BLOOD VI STRP: ORAL_STRIP | Status: DC

## 2014-12-15 MED ORDER — UMECLIDINIUM-VILANTEROL 62.5-25 MCG/INH IN AEPB: 1.0000 | INHALATION_SPRAY | Freq: Every day | RESPIRATORY_TRACT | Status: DC

## 2014-12-15 MED ORDER — PREDNISONE 20 MG PO TABS: 40.0000 mg | ORAL_TABLET | Freq: Every day | ORAL | Status: DC

## 2014-12-15 MED ORDER — DOXYCYCLINE HYCLATE 100 MG PO TABS: 100.0000 mg | ORAL_TABLET | Freq: Two times a day (BID) | ORAL | Status: DC

## 2014-12-15 MED ORDER — ALBUTEROL SULFATE (2.5 MG/3ML) 0.083% IN NEBU: 2.5000 mg | INHALATION_SOLUTION | RESPIRATORY_TRACT | Status: DC | PRN

## 2014-12-15 MED ORDER — METHYLPREDNISOLONE ACETATE 40 MG/ML IJ SUSP
40.0000 mg | Freq: Once | INTRAMUSCULAR | Status: AC
  Administered 2014-12-15: 40 mg via INTRAMUSCULAR

## 2014-12-15 MED ORDER — ALBUTEROL SULFATE HFA 108 (90 BASE) MCG/ACT IN AERS: 2.0000 | INHALATION_SPRAY | Freq: Four times a day (QID) | RESPIRATORY_TRACT | Status: DC | PRN

## 2014-12-15 MED ORDER — METFORMIN HCL 1000 MG PO TABS: ORAL_TABLET | ORAL | Status: DC

## 2014-12-15 MED ORDER — GUAIFENESIN-CODEINE 100-10 MG/5ML PO SOLN: 5.0000 mL | Freq: Once | ORAL | Status: DC

## 2014-12-15 NOTE — Progress Notes (Signed)
Pre visit review using our clinic review tool, if applicable. No additional management support is needed unless otherwise documented below in the visit note. 

## 2014-12-15 NOTE — Patient Instructions (Addendum)
We are refilling the albuterol nebulizer so you can use every 4 hours. Once we get you out of the flare you should not need this every 4 hours.   We are giving you a steroid shot today to help the lungs.   We are giving you these prescriptions as well:  1. Prednisone (take 2 pills once a day for 1 week) (these are the steroids)  2. Doxycycline (take 1 pill twice a day for 1 week) (this is the antibiotic)  3. Anoro inhaler (to replace spiriva) (1 puff once a day for the breathing, take everyday this helps to keep the lungs open)   We will also get you back in with the lung doctor upstairs for the breathing which has been doing poorly lately.   If you are not feeling better in 3-4 days call the office. We are going to check some blood work today as well and call you with the results.

## 2014-12-17 ENCOUNTER — Telehealth: Payer: Self-pay | Admitting: Internal Medicine

## 2014-12-17 NOTE — Telephone Encounter (Signed)
We have been trying to call [facility]. First call was on hold for 5 mins, and second call someone pick the phone up and hung up! Call pt to inform him status. He states he is standing right in front of the pharmacist. Inform him will try to call again. Called [facility] spoke with Summer she stated needing to clarify directions on cough syrup. MD for got to put daily. Instruction should be take 5 ml by mouth daily as needed. Will cotrrect med list.../lmb

## 2014-12-17 NOTE — Telephone Encounter (Signed)
Can you please call in his guaiFENesin-codeine 100-10 MG/5ML syrup [[id number]]  Asap.  He is waiting at [facility] on [address]

## 2014-12-19 NOTE — Progress Notes (Signed)
   Subjective:    Patient ID: [name], male    DOB: 01-23-1951, 64 y.o.   MRN: [id number]  HPI The patient is a 64 YO man coming back in with acute breathing problems. Last visit he was treated for COPD exacerbation. He did well with antibiotics and steroids. Started feeling bad about 2 weeks ago. Using albuterol every 4 hours and not breathing that well. Getting SOB very easily. Coughing and not much more than usual. Denies sinus congestion or drainage, has not tried anything except his albuterol for the breathing. Still taking spiriva daily.   Review of Systems  Constitutional: Negative for fever, activity change, appetite change, fatigue and unexpected weight change.  HENT: Negative.   Eyes: Negative.   Respiratory: Positive for cough and shortness of breath. Negative for chest tightness and wheezing.   Cardiovascular: Negative for chest pain, palpitations and leg swelling.  Gastrointestinal: Negative for nausea, abdominal pain, diarrhea, constipation and abdominal distention.  Musculoskeletal: Negative.   Skin: Negative.   Neurological: Negative.   Psychiatric/Behavioral: Negative.       Objective:   Physical Exam  Constitutional: He is oriented to person, place, and time. He appears well-developed and well-nourished.  Overweight  HENT:  Head: Normocephalic and atraumatic.  Eyes: EOM are normal.  Neck: Normal range of motion.  Cardiovascular: Normal rate and regular rhythm.   Pulmonary/Chest: Effort normal and breath sounds normal. No respiratory distress.  Coarse wheezing and rhonchi bilaterally  Abdominal: Soft. He exhibits no distension. There is no tenderness. There is no rebound.  Musculoskeletal: He exhibits no edema.  Neurological: He is alert and oriented to person, place, and time. Coordination normal.  Skin: Skin is warm and dry.  Psychiatric: He has a normal mood and affect.   Filed Vitals:   12/15/14 0844  BP: 130/70  Pulse: 116  Temp: 98.2 F (36.8 C)    TempSrc: Oral  Resp: 24  Height: 5\' 6"  (1.676 m)  Weight: 170 lb 12 oz (77.452 kg)  SpO2: 93%      Assessment & Plan:  Depo-medrol 40 mg IM given at visit.

## 2014-12-19 NOTE — Assessment & Plan Note (Signed)
With exacerbation today. Given depo-medrol injection today. Prednisone and azithromycin. Rx for anoro instead of his spiriva. Referral to pulmonary for his severe COPD with recent exacerbation. Not a current smoker. Advised if worsens or does not improve to seek emergent care.

## 2014-12-24 ENCOUNTER — Other Ambulatory Visit: Payer: Self-pay | Admitting: Internal Medicine

## 2014-12-24 DIAGNOSIS — E1165 Type 2 diabetes mellitus with hyperglycemia: Secondary | ICD-10-CM

## 2015-01-04 ENCOUNTER — Other Ambulatory Visit: Payer: Self-pay | Admitting: Internal Medicine

## 2015-01-11 ENCOUNTER — Ambulatory Visit (INDEPENDENT_AMBULATORY_CARE_PROVIDER_SITE_OTHER): Payer: Commercial Managed Care - HMO | Admitting: Endocrinology

## 2015-01-11 ENCOUNTER — Encounter: Payer: Self-pay | Admitting: Endocrinology

## 2015-01-11 ENCOUNTER — Encounter: Payer: Commercial Managed Care - HMO | Attending: Endocrinology | Admitting: Nutrition

## 2015-01-11 VITALS — BP 120/82 | HR 111 | Temp 97.7°F | Ht 66.0 in | Wt 175.0 lb

## 2015-01-11 DIAGNOSIS — E1151 Type 2 diabetes mellitus with diabetic peripheral angiopathy without gangrene: Secondary | ICD-10-CM | POA: Diagnosis not present

## 2015-01-11 DIAGNOSIS — Z794 Long term (current) use of insulin: Secondary | ICD-10-CM | POA: Insufficient documentation

## 2015-01-11 DIAGNOSIS — E119 Type 2 diabetes mellitus without complications: Secondary | ICD-10-CM | POA: Insufficient documentation

## 2015-01-11 DIAGNOSIS — Z713 Dietary counseling and surveillance: Secondary | ICD-10-CM | POA: Insufficient documentation

## 2015-01-11 MED ORDER — INSULIN GLARGINE 100 UNIT/ML SOLOSTAR PEN: 20.0000 [IU] | PEN_INJECTOR | SUBCUTANEOUS | Status: DC

## 2015-01-11 NOTE — Patient Instructions (Addendum)
good diet and exercise significantly improve the control of your diabetes.  please let me know if you wish to be referred to a dietician.  high blood sugar is very risky to your health.  you should see an eye doctor and dentist every year.  It is very important to get all recommended vaccinations.  controlling your blood pressure and cholesterol drastically reduces the damage diabetes does to your body.  Those who smoke should quit.  please discuss these with your doctor.  check your blood sugar 4 times per day: before the 3 meals, and at bedtime.  also check if you have symptoms of your blood sugar being too high or too low.  please keep a record of the readings and bring it to your next appointment here (or you can bring the meter itself).  You can write it on any piece of paper.  please call us sooner if your blood sugar goes below 70, or if you have a lot of readings over 200. For now: Please change the glipizide to an insulin called "lantus," 20 units each morning, and: Please continue the same metformin.   Please come back for a follow-up appointment in 2-4 weeks.

## 2015-01-11 NOTE — Progress Notes (Signed)
Subjective:    Patient ID: [name], male    DOB: 1950-09-05, 64 y.o.   MRN: [id number]  HPI pt states DM was dx'ed in 1995; he has mild if any neuropathy of the lower extremities, but he has associated PAD; he has never been on insulin; pt says his diet and exercise are good; he has never had pancreatitis, severe hypoglycemia or DKA.  He is off prednisone now, but he is chronically off and on.  Past Medical History  Diagnosis Date  . Hypertension   . Coronary artery disease   . Hepatitis C   . High cholesterol   . Arthritis   . Hepatitis   . COPD (chronic obstructive pulmonary disease) ([place])   . Shortness of breath dyspnea   . Asthma   . Diabetes mellitus     type 2    Past Surgical History  Procedure Laterality Date  . Esophagogastroduodenoscopy  02/09/2011    Procedure: ESOPHAGOGASTRODUODENOSCOPY (EGD);  Surgeon: Missy Sabins, MD;  Location: [facility] ENDOSCOPY;  Service: Endoscopy;  Laterality: N/A;  . Joint replacement  10/22/2009    knee    Social History   Social History  . Marital Status: Single    Spouse Name: N/A  . Number of Children: N/A  . Years of Education: N/A   Occupational History  . Not on file.   Social History Main Topics  . Smoking status: Former Smoker -- 1.50 packs/day for 30 years    Types: Cigarettes    Quit date: 03/19/2013  . Smokeless tobacco: Never Used  . 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 Drug Use: No  . Sexual Activity: Not on file   Other Topics Concern  . Not on file   Social History Narrative    Current Outpatient Prescriptions on File Prior to Visit  Medication Sig Dispense Refill  . albuterol (PROVENTIL HFA;VENTOLIN HFA) 108 (90 BASE) MCG/ACT inhaler Inhale 2 puffs into the lungs every 6 (six) hours as needed for wheezing or shortness of breath. 3 Inhaler 1  . albuterol (PROVENTIL) (2.5 MG/3ML) 0.083% nebulizer solution Take 3 mLs (2.5 mg total) by nebulization every 4 (four) hours as  needed for wheezing or shortness of breath. 120 mL 5  . aspirin EC 81 MG tablet Take 1 tablet (81 mg total) by mouth daily. 30 tablet 3  . cholecalciferol (VITAMIN D) 1000 UNITS tablet Take 1 tablet (1,000 Units total) by mouth daily. 30 tablet 0  . DUREZOL 0.05 % EMUL     . glucose blood test strip Use as instructed to check blood sugar four times a day. DXE11.09 400 each 1  . hydrOXYzine (ATARAX/VISTARIL) 25 MG tablet Take 1 tablet (25 mg total) by mouth every 6 (six) hours. 120 tablet 5  . Ibuprofen 200 MG CAPS Take 200 mg by mouth every 6 (six) hours as needed. pain    . ketorolac (ACULAR) 0.4 % SOLN     . Lancets MISC Use to test sugar four times daily. DX E11.09 400 each 1  . losartan-hydrochlorothiazide (HYZAAR) 100-12.5 MG per tablet Take 1 tablet by mouth daily. 30 tablet 6  . metFORMIN (GLUCOPHAGE) 1000 MG tablet TAKE 1 TABLET TWICE DAILY WITH MEALS 60 tablet 6  . Multiple Vitamin (MULTIVITAMIN) tablet Take 1 tablet by mouth daily.    [name] ofloxacin (OCUFLOX) 0.3 % ophthalmic solution     . ranitidine (ZANTAC) 150 MG tablet Take 150 mg  by mouth 2 (two) times daily.    . rosuvastatin (CRESTOR) 20 MG tablet Take 1 tablet (20 mg total) by mouth daily. 30 tablet 6  . traZODone (DESYREL) 50 MG tablet TAKE ONE TABLET BY MOUTH AT BEDTIME AS NEEDED FOR SLEEP 30 tablet 0  . triamcinolone cream (KENALOG) 0.1 %     . Umeclidinium-Vilanterol 62.5-25 MCG/INH AEPB Inhale 1 puff into the lungs daily. 60 each 6   No current facility-administered medications on file prior to visit.    Allergies  Allergen Reactions  . Mobic [Meloxicam] Rash    Family History  Problem Relation Age of Onset  . Stroke Mother   . Diabetes Mother   . Hypertension Mother   . Hyperlipidemia Mother   . Heart disease Father   . Hypertension Father   . Heart attack Father     BP 120/82 mmHg  Pulse 111  Temp(Src) 97.7 F (36.5 C) (Oral)  Ht 5\' 6"  (1.676 m)  Wt 175 lb (79.379 kg)  BMI 28.26 kg/m2  SpO2  96%  Review of Systems denies weight loss, blurry vision, headache, chest pain, sob, n/v, excessive diaphoresis, memory loss, cold intolerance, and rhinorrhea.  He has polyuria, easy bruising, and leg cramps.     Objective:   Physical Exam VS: see vs page GEN: no distress HEAD: head: no deformity eyes: no periorbital swelling, no proptosis external nose and ears are normal mouth: no lesion seen NECK: supple, thyroid is not enlarged CHEST WALL: no deformity LUNGS: clear to auscultation BREASTS:  No gynecomastia CV: reg rate and rhythm, no murmur ABD: abdomen is soft, nontender.  no hepatosplenomegaly.  not distended.  no hernia MUSCULOSKELETAL: muscle bulk and strength are grossly normal.  no obvious joint swelling.  gait is normal and steady EXTEMITIES: no deformity.  no ulcer on the feet.  feet are of normal color and temp.  no edema PULSES: dorsalis pedis intact bilat.  no carotid bruit NEURO:  cn 2-12 grossly intact.   readily moves all 4's.  sensation is intact to touch on the feet SKIN:  Normal texture and temperature.  No rash or suspicious lesion is visible.   NODES:  None palpable at the neck PSYCH: alert, well-oriented.  Does not appear anxious nor depressed.   Lab Results  Component Value Date   HGBA1C 10.4* 12/15/2014   i personally reviewed electrocardiogram tracing (11/09/14): Indication: sob Impression: sinus tachycardia  I discussed with Leonia Reader, RN, who teaches pt about insulin today.      Assessment & Plan:  DM: severe exacerbation.  We discussed different insulin regimens, and the benefits of multiple daily injections.  he chooses qd insulin.   COPD: new to me: as he is intermittently on steroids, we'll work around that.    Patient is advised the following: Patient Instructions  good diet and exercise significantly improve the control of your diabetes.  please let me know if you wish to be referred to a dietician.  high blood sugar is very risky to  your health.  you should see an eye doctor and dentist every year.  It is very important to get all recommended vaccinations.  controlling your blood pressure and cholesterol drastically reduces the damage diabetes does to your body.  Those who smoke should quit.  please discuss these with your doctor.  check your blood sugar 4 times per day: before the 3 meals, and at bedtime.  also check if you have symptoms of your blood sugar being too  high or too low.  please keep a record of the readings and bring it to your next appointment here (or you can bring the meter itself).  You can write it on any piece of paper.  please call us sooner if your blood sugar goes below 70, or if you have a lot of readings over 200. For now: Please change the glipizide to an insulin called "lantus," 20 units each morning, and: Please continue the same metformin.   Please come back for a follow-up appointment in 2-4 weeks.

## 2015-01-12 NOTE — Patient Instructions (Signed)
Take insulin as directed by Dr. Loanne Drilling every day.   Call the office if having low blood sugars.

## 2015-01-12 NOTE — Progress Notes (Signed)
We discussed how the insulin works, and how to inject the insulin via the pen.  He was given a package of Nano pen needles to use.  He redemonstrated how to attach a needle, dial a dose and the procedure for injecting the insulin.  We discussed site rotation, and low blood sugar--symptoms and treatment.  He had no final questions. We reviewed the need to test blood sugars--both before and 2 hours after the meal, and the goals for those readings--less than 120 before meals and less than 160, hrs. After.  He reported good understanding of this.

## 2015-01-13 DIAGNOSIS — H25811 Combined forms of age-related cataract, right eye: Secondary | ICD-10-CM | POA: Diagnosis not present

## 2015-01-13 DIAGNOSIS — E119 Type 2 diabetes mellitus without complications: Secondary | ICD-10-CM | POA: Diagnosis not present

## 2015-01-13 DIAGNOSIS — H25812 Combined forms of age-related cataract, left eye: Secondary | ICD-10-CM | POA: Diagnosis not present

## 2015-01-26 ENCOUNTER — Ambulatory Visit: Payer: Commercial Managed Care - HMO | Admitting: Vascular Surgery

## 2015-01-26 ENCOUNTER — Encounter (HOSPITAL_COMMUNITY): Payer: Commercial Managed Care - HMO

## 2015-02-02 ENCOUNTER — Ambulatory Visit: Payer: Commercial Managed Care - HMO | Admitting: Vascular Surgery

## 2015-02-02 ENCOUNTER — Encounter (HOSPITAL_COMMUNITY): Payer: Commercial Managed Care - HMO

## 2015-02-03 ENCOUNTER — Other Ambulatory Visit: Payer: Self-pay | Admitting: Internal Medicine

## 2015-02-07 DIAGNOSIS — H2512 Age-related nuclear cataract, left eye: Secondary | ICD-10-CM | POA: Diagnosis not present

## 2015-02-07 DIAGNOSIS — H2589 Other age-related cataract: Secondary | ICD-10-CM | POA: Diagnosis not present

## 2015-02-09 ENCOUNTER — Emergency Department (HOSPITAL_COMMUNITY)
Admission: EM | Admit: 2015-02-09 | Discharge: 2015-02-09 | Disposition: A | Discharge status: Home / Self Care | Payer: Commercial Managed Care - HMO | Attending: Emergency Medicine | Admitting: Emergency Medicine

## 2015-02-09 ENCOUNTER — Encounter (HOSPITAL_COMMUNITY): Payer: Self-pay | Admitting: Emergency Medicine

## 2015-02-09 ENCOUNTER — Emergency Department (HOSPITAL_COMMUNITY): Payer: Commercial Managed Care - HMO

## 2015-02-09 DIAGNOSIS — J441 Chronic obstructive pulmonary disease with (acute) exacerbation: Secondary | ICD-10-CM | POA: Insufficient documentation

## 2015-02-09 DIAGNOSIS — Z79899 Other long term (current) drug therapy: Secondary | ICD-10-CM | POA: Insufficient documentation

## 2015-02-09 DIAGNOSIS — M1990 Unspecified osteoarthritis, unspecified site: Secondary | ICD-10-CM | POA: Insufficient documentation

## 2015-02-09 DIAGNOSIS — J45901 Unspecified asthma with (acute) exacerbation: Secondary | ICD-10-CM | POA: Diagnosis not present

## 2015-02-09 DIAGNOSIS — Z792 Long term (current) use of antibiotics: Secondary | ICD-10-CM | POA: Insufficient documentation

## 2015-02-09 DIAGNOSIS — Z87891 Personal history of nicotine dependence: Secondary | ICD-10-CM | POA: Diagnosis not present

## 2015-02-09 DIAGNOSIS — R05 Cough: Secondary | ICD-10-CM | POA: Diagnosis not present

## 2015-02-09 DIAGNOSIS — E7800 Pure hypercholesterolemia, unspecified: Secondary | ICD-10-CM | POA: Diagnosis not present

## 2015-02-09 DIAGNOSIS — I2510 Atherosclerotic heart disease of native coronary artery without angina pectoris: Secondary | ICD-10-CM | POA: Diagnosis not present

## 2015-02-09 DIAGNOSIS — E119 Type 2 diabetes mellitus without complications: Secondary | ICD-10-CM | POA: Insufficient documentation

## 2015-02-09 DIAGNOSIS — I10 Essential (primary) hypertension: Secondary | ICD-10-CM | POA: Diagnosis not present

## 2015-02-09 DIAGNOSIS — Z8619 Personal history of other infectious and parasitic diseases: Secondary | ICD-10-CM | POA: Insufficient documentation

## 2015-02-09 DIAGNOSIS — Z7982 Long term (current) use of aspirin: Secondary | ICD-10-CM | POA: Insufficient documentation

## 2015-02-09 DIAGNOSIS — Z8719 Personal history of other diseases of the digestive system: Secondary | ICD-10-CM | POA: Insufficient documentation

## 2015-02-09 DIAGNOSIS — Z794 Long term (current) use of insulin: Secondary | ICD-10-CM | POA: Diagnosis not present

## 2015-02-09 DIAGNOSIS — Z7984 Long term (current) use of oral hypoglycemic drugs: Secondary | ICD-10-CM | POA: Diagnosis not present

## 2015-02-09 DIAGNOSIS — R0602 Shortness of breath: Secondary | ICD-10-CM | POA: Diagnosis not present

## 2015-02-09 LAB — COMPREHENSIVE METABOLIC PANEL
ALT: 135 U/L — ABNORMAL HIGH (ref 17–63)
AST: 87 U/L — ABNORMAL HIGH (ref 15–41)
Albumin: 4.1 g/dL (ref 3.5–5.0)
Alkaline Phosphatase: 56 U/L (ref 38–126)
Anion gap: 10 (ref 5–15)
BUN: 15 mg/dL (ref 6–20)
CHLORIDE: 97 mmol/L — AB (ref 101–111)
CO2: 28 mmol/L (ref 22–32)
CREATININE: 0.97 mg/dL (ref 0.61–1.24)
Calcium: 9.9 mg/dL (ref 8.9–10.3)
Glucose, Bld: 228 mg/dL — ABNORMAL HIGH (ref 65–99)
POTASSIUM: 4.6 mmol/L (ref 3.5–5.1)
Sodium: 135 mmol/L (ref 135–145)
Total Bilirubin: 0.6 mg/dL (ref 0.3–1.2)
Total Protein: 7.2 g/dL (ref 6.5–8.1)

## 2015-02-09 LAB — CBC WITH DIFFERENTIAL/PLATELET
BASOS ABS: 0 10*3/uL (ref 0.0–0.1)
BASOS PCT: 0 %
Eosinophils Absolute: 0.3 10*3/uL (ref 0.0–0.7)
Eosinophils Relative: 4 %
HEMATOCRIT: 42.1 % (ref 39.0–52.0)
Hemoglobin: 14.1 g/dL (ref 13.0–17.0)
Lymphocytes Relative: 42 %
Lymphs Abs: 2.8 10*3/uL (ref 0.7–4.0)
MCH: 28.5 pg (ref 26.0–34.0)
MCHC: 33.5 g/dL (ref 30.0–36.0)
MCV: 85.2 fL (ref 78.0–100.0)
MONO ABS: 0.3 10*3/uL (ref 0.1–1.0)
Monocytes Relative: 4 %
NEUTROS ABS: 3.4 10*3/uL (ref 1.7–7.7)
NEUTROS PCT: 50 %
PLATELETS: 264 10*3/uL (ref 150–400)
RBC: 4.94 MIL/uL (ref 4.22–5.81)
RDW: 12.4 % (ref 11.5–15.5)
WBC: 6.7 10*3/uL (ref 4.0–10.5)

## 2015-02-09 LAB — TROPONIN I

## 2015-02-09 LAB — BRAIN NATRIURETIC PEPTIDE: B NATRIURETIC PEPTIDE 5: 61.7 pg/mL (ref 0.0–100.0)

## 2015-02-09 MED ORDER — PREDNISONE 20 MG PO TABS: ORAL_TABLET | ORAL | Status: DC

## 2015-02-09 MED ORDER — ALBUTEROL SULFATE HFA 108 (90 BASE) MCG/ACT IN AERS: 1.0000 | INHALATION_SPRAY | Freq: Four times a day (QID) | RESPIRATORY_TRACT | Status: DC | PRN

## 2015-02-09 MED ORDER — PREDNISONE 20 MG PO TABS
60.0000 mg | ORAL_TABLET | Freq: Once | ORAL | Status: AC
  Administered 2015-02-09: 60 mg via ORAL
  Filled 2015-02-09: qty 3

## 2015-02-09 MED ORDER — METHYLPREDNISOLONE SODIUM SUCC 125 MG IJ SOLR
125.0000 mg | Freq: Once | INTRAMUSCULAR | Status: AC
  Administered 2015-02-09: 125 mg via INTRAVENOUS
  Filled 2015-02-09: qty 2

## 2015-02-09 MED ORDER — IPRATROPIUM-ALBUTEROL 0.5-2.5 (3) MG/3ML IN SOLN
3.0000 mL | Freq: Once | RESPIRATORY_TRACT | Status: AC
  Administered 2015-02-09: 3 mL via RESPIRATORY_TRACT
  Filled 2015-02-09: qty 3

## 2015-02-09 MED ORDER — IPRATROPIUM-ALBUTEROL 0.5-2.5 (3) MG/3ML IN SOLN
3.0000 mL | Freq: Once | RESPIRATORY_TRACT | Status: AC
  Administered 2015-02-09: 3 mL via RESPIRATORY_TRACT

## 2015-02-09 MED ORDER — IPRATROPIUM-ALBUTEROL 0.5-2.5 (3) MG/3ML IN SOLN
RESPIRATORY_TRACT | Status: AC
  Filled 2015-02-09: qty 3

## 2015-02-09 NOTE — ED Provider Notes (Signed)
CSN: [id number]     Arrival date & time 02/09/15  [id number] History   First MD Initiated Contact with Patient 02/09/15 1158     Chief Complaint  Patient presents with  . Cough  . Shortness of Breath     (Consider location/radiation/quality/duration/timing/severity/associated sxs/prior Treatment) HPI Patient reports he has a history of COPD and asthma. He reports he gets flareups this time of the year. He reports he had to stand out in the rain and catch the bus a day or 2 ago and since then he has had increased wheezing. No fever. He does report central chest pain with coughing. Patient reports that his doctor has him on a once a day inhaler and a combo inhaler every 6 hours. He reports he is using those but his symptoms are worsening. He reports in the past he had an inhaler that he got from the emergency department that he could use whenever he needed it and that seemed to help. He states he doesn't have THAT inhaler anymore. Patient ports a cataract repair surgery done on Monday. He has no complaints regarding his surgery or any immediate problems. He has not had any lower extremity swelling or calf pain. He estimates the last time he took oral steroids was about 2 months ago. Past Medical History  Diagnosis Date  . Hypertension   . Coronary artery disease   . Hepatitis C   . High cholesterol   . Arthritis   . Hepatitis   . COPD (chronic obstructive pulmonary disease) ([place])   . Shortness of breath dyspnea   . Asthma   . Diabetes mellitus     type 2   Past Surgical History  Procedure Laterality Date  . Esophagogastroduodenoscopy  02/09/2011    Procedure: ESOPHAGOGASTRODUODENOSCOPY (EGD);  Surgeon: Missy Sabins, MD;  Location: [facility] ENDOSCOPY;  Service: Endoscopy;  Laterality: N/A;  . Joint replacement  10/22/2009    knee   Family History  Problem Relation Age of Onset  . Stroke Mother   . Diabetes Mother   . Hypertension Mother   . Hyperlipidemia Mother   . Heart disease Father   .  Hypertension Father   . Heart attack Father    Social History  Substance Use Topics  . Smoking status: Former Smoker -- 1.50 packs/day for 30 years    Types: Cigarettes    Quit date: 03/19/2013  . Smokeless tobacco: Never Used  . 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   Allergies  Crestor and Mobic  Home Medications   Prior to Admission medications   Medication Sig Start Date End Date Taking? Authorizing Provider  albuterol (PROVENTIL HFA;VENTOLIN HFA) 108 (90 BASE) MCG/ACT inhaler Inhale 2 puffs into the lungs every 6 (six) hours as needed for wheezing or shortness of breath. 12/15/14  Yes Hoyt Koch, MD  aspirin EC 81 MG tablet Take 1 tablet (81 mg total) by mouth daily. 08/31/13  Yes Lorayne Marek, MD  cholecalciferol (VITAMIN D) 1000 UNITS tablet Take 1 tablet (1,000 Units total) by mouth daily. 08/20/12  Yes Robbie Lis, MD  dorzolamide-timolol (COSOPT) 22.3-6.8 MG/ML ophthalmic solution Place 1 drop into the left eye 2 (two) times daily.   Yes Historical Provider, MD  DUREZOL 0.05 % EMUL Place 1 drop into the left eye 2 (two) times daily.  1 drop left eye 4 times daily for 2 days (2nd day is 02/09/15) then 1 drop left eye twice daily for 1 month 12/05/14  Yes Historical Provider, MD  hydrOXYzine (ATARAX/VISTARIL) 25 MG tablet Take 1 tablet (25 mg total) by mouth every 6 (six) hours. 09/27/14  Yes Hoyt Koch, MD  ketorolac (ACULAR) 0.4 % SOLN Place 1 drop into the left eye 4 (four) times daily.  12/08/14  Yes Historical Provider, MD  losartan-hydrochlorothiazide (HYZAAR) 100-12.5 MG per tablet Take 1 tablet by mouth daily. 09/27/14  Yes Hoyt Koch, MD  metFORMIN (GLUCOPHAGE) 1000 MG tablet TAKE 1 TABLET TWICE DAILY WITH MEALS 12/15/14  Yes Hoyt Koch, MD  ofloxacin (OCUFLOX) 0.3 % ophthalmic solution Place 1 drop into  the left eye 4 (four) times daily.  12/05/14  Yes Historical Provider, MD  SPIRIVA HANDIHALER 18 MCG inhalation capsule Take 1 puff by mouth daily. 12/05/14  Yes Historical Provider, MD  traZODone (DESYREL) 50 MG tablet TAKE ONE TABLET BY MOUTH AT BEDTIME AS NEEDED FOR SLEEP 02/03/15  Yes Hoyt Koch, MD  Umeclidinium-Vilanterol 62.5-25 MCG/INH AEPB Inhale 1 puff into the lungs daily. 12/15/14  Yes Hoyt Koch, MD  albuterol (PROVENTIL HFA;VENTOLIN HFA) 108 (90 BASE) MCG/ACT inhaler Inhale 1-2 puffs into the lungs every 6 (six) hours as needed for wheezing or shortness of breath. 02/09/15   Charlesetta Shanks, MD  albuterol (PROVENTIL) (2.5 MG/3ML) 0.083% nebulizer solution Take 3 mLs (2.5 mg total) by nebulization every 4 (four) hours as needed for wheezing or shortness of breath. 12/15/14   Hoyt Koch, MD  glucose blood test strip Use as instructed to check blood sugar four times a day. [id number] 12/15/14   Hoyt Koch, MD  Insulin Glargine (LANTUS SOLOSTAR) 100 UNIT/ML Solostar Pen Inject 30 Units into the skin every morning. And pen needles 1/day 02/11/15   Renato Shin, MD  Lancets MISC Use to test sugar four times daily. DX E11.09 12/15/14   Hoyt Koch, MD  predniSONE (DELTASONE) 20 MG tablet 3 tabs po day one, then 2 po daily x 4 days 02/09/15   Charlesetta Shanks, MD  rosuvastatin (CRESTOR) 20 MG tablet Take 1 tablet (20 mg total) by mouth daily. 09/27/14   Hoyt Koch, MD   BP 143/90 mmHg  Pulse 82  Temp(Src) 98.1 F (36.7 C) (Oral)  Resp 15  SpO2 100% Physical Exam  Constitutional: He is oriented to person, place, and time. He appears well-developed and well-nourished.  HENT:  Head: Normocephalic and atraumatic.  Eyes: EOM are normal. Pupils are equal, round, and reactive to light.  Neck: Neck supple.  Cardiovascular: Normal rate, regular rhythm, normal heart sounds and intact distal pulses.   Pulmonary/Chest:  Mild increased work of  breathing. Patient speaking in full sentences. Bilateral inspiratory and expiratory wheeze.  Abdominal: Soft. Bowel sounds are normal. He exhibits no distension. There is no tenderness.  Musculoskeletal: Normal range of motion. He exhibits no edema or tenderness.  Neurological: He is alert and oriented to person, place, and time. He has normal strength. Coordination normal. GCS eye subscore is 4. GCS verbal subscore is 5. GCS motor subscore is 6.  Skin: Skin is warm, dry and intact.  Psychiatric: He has a normal mood and affect.    ED Course  Procedures (including critical care time) Labs Review Labs Reviewed  COMPREHENSIVE METABOLIC PANEL - Abnormal; Notable for the following:    Chloride 97 (*)    Glucose, Bld  228 (*)    AST 87 (*)    ALT 135 (*)    All other components within normal limits  BRAIN NATRIURETIC PEPTIDE  TROPONIN I  CBC WITH DIFFERENTIAL/PLATELET    Imaging Review No results found. I have personally reviewed and evaluated these images and lab results as part of my medical decision-making.   EKG Interpretation   Date/Time:  Wednesday February 09 2015 12:13:47 EST Ventricular Rate:  75 PR Interval:  153 QRS Duration: 86 QT Interval:  391 QTC Calculation: 437 R Axis:   76 Text Interpretation:  Sinus rhythm normal Confirmed by Johnney Killian, MD, Jeannie Done  [id number]) on 02/09/2015 1:50:01 PM      MDM   Final diagnoses:  Asthma exacerbation   Patient is responded well to DuoNeb treatment. At this point time he will be placed on a prednisone taper. He is advised to follow-up due to his diabetes and possibility of hyperglycemia. He is counseled on signs and symptoms which return.    Charlesetta Shanks, MD 02/14/15 1726

## 2015-02-09 NOTE — ED Notes (Signed)
Pt sts cough and SOB from COPD; pt sts hx of same

## 2015-02-09 NOTE — Discharge Instructions (Signed)
Asthma Attack Prevention °While you may not be able to control the fact that you have asthma, you can take actions to prevent asthma attacks. The best way to prevent asthma attacks is to maintain good control of your asthma. You can achieve this by: °· Taking your medicines as directed. °· Avoiding things that can irritate your airways or make your asthma symptoms worse (asthma triggers). °· Keeping track of how well your asthma is controlled and of any changes in your symptoms. °· Responding quickly to worsening asthma symptoms (asthma attack). °· Seeking emergency care when it is needed. °WHAT ARE SOME WAYS TO PREVENT AN ASTHMA ATTACK? °Have a Plan °Work with your health care provider to create a written plan for managing and treating your asthma attacks (asthma action plan). This plan includes: °· A list of your asthma triggers and how you can avoid them. °· Information on when medicines should be taken and when their dosages should be changed. °· The use of a device that measures how well your lungs are working (peak flow meter). °Monitor Your Asthma °Use your peak flow meter and record your results in a journal every day. A drop in your peak flow numbers on one or more days may indicate the start of an asthma attack. This can happen even before you start to feel symptoms. You can prevent an asthma attack from getting worse by following the steps in your asthma action plan. °Avoid Asthma Triggers °Work with your asthma health care provider to find out what your asthma triggers are. This can be done by: °· Allergy testing. °· Keeping a journal that notes when asthma attacks occur and the factors that may have contributed to them. °· Determining if there are other medical conditions that are making your asthma worse. °Once you have determined your asthma triggers, take steps to avoid them. This may include avoiding excessive or prolonged exposure to: °· Dust. Have someone dust and vacuum your home for you once or  twice a week. Using a high-efficiency particulate arrestance (HEPA) vacuum is best. °· Smoke. This includes campfire smoke, forest fire smoke, and secondhand smoke from tobacco products. °· Pet dander. Avoid contact with animals that you know you are allergic to. °· Allergens from trees, grasses or pollens. Avoid spending a lot of time outdoors when pollen counts are high, and on very windy days. °· Very cold, dry, or humid air. °· Mold. °· Foods that contain high amounts of sulfites. °· Strong odors. °· Outdoor air pollutants, such as engine exhaust. °· Indoor air pollutants, such as aerosol sprays and fumes from household cleaners. °· Household pests, including dust mites and cockroaches, and pest droppings. °· Certain medicines, including NSAIDs. Always talk to your health care provider before stopping or starting any new medicines. °Medicines °Take over-the-counter and prescription medicines only as told by your health care provider. Many asthma attacks can be prevented by carefully following your medicine schedule. Taking your medicines correctly is especially important when you cannot avoid certain asthma triggers. °Act Quickly °If an asthma attack does happen, acting quickly can decrease how severe it is and how long it lasts. Take these steps:  °· Pay attention to your symptoms. If you are coughing, wheezing, or having difficulty breathing, do not wait to see if your symptoms go away on their own. Follow your asthma action plan. °· If you have followed your asthma action plan and your symptoms are not improving, call your health care provider or seek immediate medical care   at the nearest hospital. It is important to note how often you need to use your fast-acting rescue inhaler. If you are using your rescue inhaler more often, it may mean that your asthma is not under control. Adjusting your asthma treatment plan may help you to prevent future asthma attacks and help you to gain better control of your  condition. HOW CAN I PREVENT AN ASTHMA ATTACK WHEN I EXERCISE? Follow advice from your health care provider about whether you should use your fast-acting inhaler before exercising. Many people with asthma experience exercise-induced bronchoconstriction (EIB). This condition often worsens during vigorous exercise in cold, humid, or dry environments. Usually, people with EIB can stay very active by pre-treating with a fast-acting inhaler before exercising.   This information is not intended to replace advice given to you by your health care provider. Make sure you discuss any questions you have with your health care provider.   Document Released: 02/07/2009 Document Revised: 11/10/2014 Document Reviewed: 07/22/2014 Elsevier Interactive Patient Education 2016 [place].  Asthma, Adult Asthma is a condition of the lungs in which the airways tighten and narrow. Asthma can make it hard to breathe. Asthma cannot be cured, but medicine and lifestyle changes can help control it. Asthma may be started (triggered) by:  Animal skin flakes (dander).  Dust.  Cockroaches.  Pollen.  Mold.  Smoke.  Cleaning products.  Hair sprays or aerosol sprays.  Paint fumes or strong smells.  Cold air, weather changes, and winds.  Crying or laughing hard.  Stress.  Certain medicines or drugs.  Foods, such as dried fruit, potato chips, and sparkling grape juice.  Infections or conditions (colds, flu).  Exercise.  Certain medical conditions or diseases.  Exercise or tiring activities. HOME CARE   Take medicine as told by your doctor.  Use a peak flow meter as told by your doctor. A peak flow meter is a tool that measures how well the lungs are working.  Record and keep track of the peak flow meter's readings.  Understand and use the asthma action plan. An asthma action plan is a written plan for taking care of your asthma and treating your attacks.  To help prevent asthma attacks:  Do  not smoke. Stay away from secondhand smoke.  Change your heating and air conditioning filter often.  Limit your use of fireplaces and wood stoves.  Get rid of pests (such as roaches and mice) and their droppings.  Throw away plants if you see mold on them.  Clean your floors. Dust regularly. Use cleaning products that do not smell.  Have someone vacuum when you are not home. Use a vacuum cleaner with a HEPA filter if possible.  Replace carpet with wood, tile, or vinyl flooring. Carpet can trap animal skin flakes and dust.  Use allergy-proof pillows, mattress covers, and box spring covers.  Wash bed sheets and blankets every week in hot water and dry them in a dryer.  Use blankets that are made of polyester or cotton.  Clean bathrooms and kitchens with bleach. If possible, have someone repaint the walls in these rooms with mold-resistant paint. Keep out of the rooms that are being cleaned and painted.  Wash hands often. GET HELP IF:  You have make a whistling sound when breaking (wheeze), have shortness of breath, or have a cough even if taking medicine to prevent attacks.  The colored mucus you cough up (sputum) is thicker than usual.  The colored mucus you cough up changes from clear  or white to yellow, green, gray, or bloody.  You have problems from the medicine you are taking such as:  A rash.  Itching.  Swelling.  Trouble breathing.  You need reliever medicines more than 2-3 times a week.  Your peak flow measurement is still at 50-79% of your personal best after following the action plan for 1 hour.  You have a fever. GET HELP RIGHT AWAY IF:   You seem to be worse and are not responding to medicine during an asthma attack.  You are short of breath even at rest.  You get short of breath when doing very little activity.  You have trouble eating, drinking, or talking.  You have chest pain.  You have a fast heartbeat.  Your lips or fingernails start to  turn blue.  You are light-headed, dizzy, or faint.  Your peak flow is less than 50% of your personal best.   This information is not intended to replace advice given to you by your health care provider. Make sure you discuss any questions you have with your health care provider.   Document Released: 08/08/2007 Document Revised: 11/10/2014 Document Reviewed: 09/18/2012 Elsevier Interactive Patient Education [employer].

## 2015-02-09 NOTE — ED Notes (Signed)
Dr. Johnney Killian MD at bedside.

## 2015-02-11 ENCOUNTER — Ambulatory Visit (INDEPENDENT_AMBULATORY_CARE_PROVIDER_SITE_OTHER): Payer: Commercial Managed Care - HMO | Admitting: Endocrinology

## 2015-02-11 ENCOUNTER — Encounter: Payer: Self-pay | Admitting: Endocrinology

## 2015-02-11 VITALS — BP 136/84 | HR 88 | Temp 97.8°F | Ht 66.0 in | Wt 180.0 lb

## 2015-02-11 DIAGNOSIS — E0801 Diabetes mellitus due to underlying condition with hyperosmolarity with coma: Secondary | ICD-10-CM | POA: Diagnosis not present

## 2015-02-11 DIAGNOSIS — Z794 Long term (current) use of insulin: Secondary | ICD-10-CM | POA: Diagnosis not present

## 2015-02-11 MED ORDER — INSULIN GLARGINE 100 UNIT/ML SOLOSTAR PEN: 30.0000 [IU] | PEN_INJECTOR | SUBCUTANEOUS | Status: DC

## 2015-02-11 NOTE — Patient Instructions (Addendum)
check your blood sugar 4 times per day: before the 3 meals, and at bedtime.  also check if you have symptoms of your blood sugar being too high or too low.  please keep a record of the readings and bring it to your next appointment here (or you can bring the meter itself).  You can write it on any piece of paper.  please call us sooner if your blood sugar goes below 70, or if you have a lot of readings over 200. Please stop taking the glipizide, and:  Increase the lantus to 30 units each morning, and:  Please continue the same metformin.   Please call us next week, to tell us how the blood sugar is doing.   The effect of the prednisone persists approx 1 week after you stop taking it.   Please come back for a follow-up appointment in 6 weeks.

## 2015-02-11 NOTE — Progress Notes (Signed)
Subjective:    Patient ID: [name], male    DOB: 04/14/1950, 64 y.o.   MRN: [id number]  HPI Pt returns for f/u of diabetes mellitus: DM type: 2 Dx'ed: [id number] Complications: PAD Therapy: insulin since 2016 DKA: never Severe hypoglycemia: never Pancreatitis: never Other: he chooses qd insulin; he is chronically off and on prednisone, for asthma Interval history: He was seen in ER 2 days ago with asthma.  Prior to the steroids, cbg's were approx 100.  Since on the steroids, cbg's have increased to the 200's.  He is still on the glipizide.  Meter is downloaded today, and the printout is scanned into the record.  Including prior to and on the prednisone, it varies from 100-300's.  It is in general higher as the day goes on. Past Medical History  Diagnosis Date  . Hypertension   . Coronary artery disease   . Hepatitis C   . High cholesterol   . Arthritis   . Hepatitis   . COPD (chronic obstructive pulmonary disease) ([place])   . Shortness of breath dyspnea   . Asthma   . Diabetes mellitus     type 2    Past Surgical History  Procedure Laterality Date  . Esophagogastroduodenoscopy  02/09/2011    Procedure: ESOPHAGOGASTRODUODENOSCOPY (EGD);  Surgeon: Missy Sabins, MD;  Location: [facility] ENDOSCOPY;  Service: Endoscopy;  Laterality: N/A;  . Joint replacement  10/22/2009    knee    Social History   Social History  . Marital Status: Single    Spouse Name: N/A  . Number of Children: N/A  . Years of Education: N/A   Occupational History  . Not on file.   Social History Main Topics  . Smoking status: Former Smoker -- 1.50 packs/day for 30 years    Types: Cigarettes    Quit date: 03/19/2013  . Smokeless tobacco: Never Used  . 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 Drug Use: No  . Sexual Activity: Not on file   Other Topics Concern  . Not on file   Social History Narrative    Current Outpatient Prescriptions on File Prior to Visit  Medication  Sig Dispense Refill  . albuterol (PROVENTIL HFA;VENTOLIN HFA) 108 (90 BASE) MCG/ACT inhaler Inhale 2 puffs into the lungs every 6 (six) hours as needed for wheezing or shortness of breath. 3 Inhaler 1  . albuterol (PROVENTIL HFA;VENTOLIN HFA) 108 (90 BASE) MCG/ACT inhaler Inhale 1-2 puffs into the lungs every 6 (six) hours as needed for wheezing or shortness of breath. 1 Inhaler 0  . albuterol (PROVENTIL) (2.5 MG/3ML) 0.083% nebulizer solution Take 3 mLs (2.5 mg total) by nebulization every 4 (four) hours as needed for wheezing or shortness of breath. 120 mL 5  . aspirin EC 81 MG tablet Take 1 tablet (81 mg total) by mouth daily. 30 tablet 3  . cholecalciferol (VITAMIN D) 1000 UNITS tablet Take 1 tablet (1,000 Units total) by mouth daily. 30 tablet 0  . dorzolamide-timolol (COSOPT) 22.3-6.8 MG/ML ophthalmic solution Place 1 drop into the left eye 2 (two) times daily.    . DUREZOL 0.05 % EMUL Place 1 drop into the left eye 2 (two) times daily. 1 drop left eye 4 times daily for 2 days (2nd day is 02/09/15) then 1 drop left eye twice daily for 1 month    . glucose blood test strip Use as instructed to check blood sugar four times a  day. DXE11.09 400 each 1  . hydrOXYzine (ATARAX/VISTARIL) 25 MG tablet Take 1 tablet (25 mg total) by mouth every 6 (six) hours. 120 tablet 5  . ketorolac (ACULAR) 0.4 % SOLN Place 1 drop into the left eye 4 (four) times daily.     . Lancets MISC Use to test sugar four times daily. DX E11.09 400 each 1  . losartan-hydrochlorothiazide (HYZAAR) 100-12.5 MG per tablet Take 1 tablet by mouth daily. 30 tablet 6  . metFORMIN (GLUCOPHAGE) 1000 MG tablet TAKE 1 TABLET TWICE DAILY WITH MEALS 60 tablet 6  . ofloxacin (OCUFLOX) 0.3 % ophthalmic solution Place 1 drop into the left eye 4 (four) times daily.     . predniSONE (DELTASONE) 20 MG tablet 3 tabs po day one, then 2 po daily x 4 days 11 tablet 0  . rosuvastatin (CRESTOR) 20 MG tablet Take 1 tablet (20 mg total) by mouth daily. 30  tablet 6  . SPIRIVA HANDIHALER 18 MCG inhalation capsule Take 1 puff by mouth daily.    . traZODone (DESYREL) 50 MG tablet TAKE ONE TABLET BY MOUTH AT BEDTIME AS NEEDED FOR SLEEP 30 tablet 0  . Umeclidinium-Vilanterol 62.5-25 MCG/INH AEPB Inhale 1 puff into the lungs daily. 60 each 6   No current facility-administered medications on file prior to visit.    Allergies  Allergen Reactions  . Crestor [Rosuvastatin]   . Mobic [Meloxicam] Rash    Family History  Problem Relation Age of Onset  . Stroke Mother   . Diabetes Mother   . Hypertension Mother   . Hyperlipidemia Mother   . Heart disease Father   . Hypertension Father   . Heart attack Father     BP 136/84 mmHg  Pulse 88  Temp(Src) 97.8 F (36.6 C) (Oral)  Ht 5\' 6"  (1.676 m)  Wt 180 lb (81.647 kg)  BMI 29.07 kg/m2  SpO2 97%  Review of Systems He denies hypoglycemia    Objective:   Physical Exam VITAL SIGNS:  See vs page.   GENERAL: no distress. Pulses: dorsalis pedis intact bilat.   MSK: no deformity of the feet.  CV: no leg edema.   Skin:  no ulcer on the feet.  normal color and temp on the feet.  Neuro: sensation is intact to touch on the feet.        Assessment & Plan:  DM: he needs increased rx.  However, the glipizide and insulin are redundant.   Asthma, mild exacerbation: this is worsening glycemic control, but he needs it.     Patient is advised the following: Patient Instructions  check your blood sugar 4 times per day: before the 3 meals, and at bedtime.  also check if you have symptoms of your blood sugar being too high or too low.  please keep a record of the readings and bring it to your next appointment here (or you can bring the meter itself).  You can write it on any piece of paper.  please call us sooner if your blood sugar goes below 70, or if you have a lot of readings over 200. Please stop taking the glipizide, and:  Increase the lantus to 30 units each morning, and:  Please continue the  same metformin.   Please call us next week, to tell us how the blood sugar is doing.   The effect of the prednisone persists approx 1 week after you stop taking it.   Please come back for a follow-up appointment in 6  weeks.

## 2015-02-17 ENCOUNTER — Telehealth: Payer: Self-pay | Admitting: Internal Medicine

## 2015-02-17 ENCOUNTER — Emergency Department (HOSPITAL_COMMUNITY): Payer: Commercial Managed Care - HMO

## 2015-02-17 ENCOUNTER — Emergency Department (HOSPITAL_COMMUNITY)
Admission: EM | Admit: 2015-02-17 | Discharge: 2015-02-17 | Disposition: A | Discharge status: Home / Self Care | Payer: Commercial Managed Care - HMO | Attending: Emergency Medicine | Admitting: Emergency Medicine

## 2015-02-17 ENCOUNTER — Encounter (HOSPITAL_COMMUNITY): Payer: Self-pay

## 2015-02-17 DIAGNOSIS — I10 Essential (primary) hypertension: Secondary | ICD-10-CM | POA: Diagnosis not present

## 2015-02-17 DIAGNOSIS — J441 Chronic obstructive pulmonary disease with (acute) exacerbation: Secondary | ICD-10-CM | POA: Diagnosis not present

## 2015-02-17 DIAGNOSIS — M1990 Unspecified osteoarthritis, unspecified site: Secondary | ICD-10-CM | POA: Diagnosis not present

## 2015-02-17 DIAGNOSIS — Z8619 Personal history of other infectious and parasitic diseases: Secondary | ICD-10-CM | POA: Diagnosis not present

## 2015-02-17 DIAGNOSIS — Z79899 Other long term (current) drug therapy: Secondary | ICD-10-CM | POA: Insufficient documentation

## 2015-02-17 DIAGNOSIS — E119 Type 2 diabetes mellitus without complications: Secondary | ICD-10-CM | POA: Insufficient documentation

## 2015-02-17 DIAGNOSIS — Z87891 Personal history of nicotine dependence: Secondary | ICD-10-CM | POA: Diagnosis not present

## 2015-02-17 DIAGNOSIS — I2510 Atherosclerotic heart disease of native coronary artery without angina pectoris: Secondary | ICD-10-CM | POA: Diagnosis not present

## 2015-02-17 DIAGNOSIS — Z794 Long term (current) use of insulin: Secondary | ICD-10-CM | POA: Insufficient documentation

## 2015-02-17 DIAGNOSIS — Z792 Long term (current) use of antibiotics: Secondary | ICD-10-CM | POA: Diagnosis not present

## 2015-02-17 DIAGNOSIS — R61 Generalized hyperhidrosis: Secondary | ICD-10-CM | POA: Insufficient documentation

## 2015-02-17 DIAGNOSIS — Z8719 Personal history of other diseases of the digestive system: Secondary | ICD-10-CM | POA: Insufficient documentation

## 2015-02-17 DIAGNOSIS — E7800 Pure hypercholesterolemia, unspecified: Secondary | ICD-10-CM | POA: Insufficient documentation

## 2015-02-17 DIAGNOSIS — Z7984 Long term (current) use of oral hypoglycemic drugs: Secondary | ICD-10-CM | POA: Diagnosis not present

## 2015-02-17 DIAGNOSIS — Z7982 Long term (current) use of aspirin: Secondary | ICD-10-CM | POA: Insufficient documentation

## 2015-02-17 DIAGNOSIS — R000 Tachycardia, unspecified: Secondary | ICD-10-CM | POA: Diagnosis not present

## 2015-02-17 DIAGNOSIS — R0602 Shortness of breath: Secondary | ICD-10-CM | POA: Diagnosis not present

## 2015-02-17 DIAGNOSIS — R069 Unspecified abnormalities of breathing: Secondary | ICD-10-CM | POA: Diagnosis not present

## 2015-02-17 LAB — BASIC METABOLIC PANEL
ANION GAP: 10 (ref 5–15)
BUN: 16 mg/dL (ref 6–20)
CALCIUM: 9.2 mg/dL (ref 8.9–10.3)
CO2: 26 mmol/L (ref 22–32)
CREATININE: 1.06 mg/dL (ref 0.61–1.24)
Chloride: 98 mmol/L — ABNORMAL LOW (ref 101–111)
GFR calc Af Amer: >60 mL/min (ref >60)
GFR calc non Af Amer: >60 mL/min (ref >60)
GLUCOSE: 430 mg/dL — AB (ref 65–99)
Potassium: 4.9 mmol/L (ref 3.5–5.1)
Sodium: 134 mmol/L — ABNORMAL LOW (ref 135–145)

## 2015-02-17 LAB — CBC WITH DIFFERENTIAL/PLATELET
Basophils Absolute: 0 10*3/uL (ref 0.0–0.1)
Basophils Relative: 0 %
EOS ABS: 0.3 10*3/uL (ref 0.0–0.7)
EOS PCT: 5 %
HCT: 42.5 % (ref 39.0–52.0)
Hemoglobin: 14 g/dL (ref 13.0–17.0)
LYMPHS ABS: 3.2 10*3/uL (ref 0.7–4.0)
Lymphocytes Relative: 47 %
MCH: 28.4 pg (ref 26.0–34.0)
MCHC: 32.9 g/dL (ref 30.0–36.0)
MCV: 86.2 fL (ref 78.0–100.0)
MONO ABS: 0.5 10*3/uL (ref 0.1–1.0)
MONOS PCT: 7 %
Neutro Abs: 2.8 10*3/uL (ref 1.7–7.7)
Neutrophils Relative %: 41 %
PLATELETS: 258 10*3/uL (ref 150–400)
RBC: 4.93 MIL/uL (ref 4.22–5.81)
RDW: 12.5 % (ref 11.5–15.5)
WBC: 6.9 10*3/uL (ref 4.0–10.5)

## 2015-02-17 LAB — BRAIN NATRIURETIC PEPTIDE: B Natriuretic Peptide: 21.5 pg/mL (ref 0.0–100.0)

## 2015-02-17 LAB — TROPONIN I

## 2015-02-17 MED ORDER — MAGNESIUM SULFATE 2 GM/50ML IV SOLN
2.0000 g | Freq: Once | INTRAVENOUS | Status: AC
  Administered 2015-02-17: 2 g via INTRAVENOUS
  Filled 2015-02-17: qty 50

## 2015-02-17 MED ORDER — PREDNISONE 10 MG PO TABS: ORAL_TABLET | ORAL | Status: DC

## 2015-02-17 MED ORDER — ALBUTEROL SULFATE HFA 108 (90 BASE) MCG/ACT IN AERS
2.0000 | INHALATION_SPRAY | RESPIRATORY_TRACT | Status: DC | PRN
  Administered 2015-02-17: 2 via RESPIRATORY_TRACT
  Filled 2015-02-17: qty 6.7

## 2015-02-17 MED ORDER — METHYLPREDNISOLONE SODIUM SUCC 125 MG IJ SOLR
125.0000 mg | Freq: Once | INTRAMUSCULAR | Status: AC
  Administered 2015-02-17: 125 mg via INTRAVENOUS
  Filled 2015-02-17: qty 2

## 2015-02-17 MED ORDER — AEROCHAMBER PLUS W/MASK MISC
Status: AC
Start: 2015-02-17 — End: 2015-02-17
  Administered 2015-02-17: 17:00:00
  Filled 2015-02-17: qty 1

## 2015-02-17 MED ORDER — ALBUTEROL (5 MG/ML) CONTINUOUS INHALATION SOLN
10.0000 mg/h | INHALATION_SOLUTION | RESPIRATORY_TRACT | Status: AC
  Administered 2015-02-17: 10 mg/h via RESPIRATORY_TRACT
  Filled 2015-02-17: qty 20

## 2015-02-17 MED ORDER — SODIUM CHLORIDE 0.9 % IV BOLUS (SEPSIS)
1000.0000 mL | Freq: Once | INTRAVENOUS | Status: AC
  Administered 2015-02-17: 1000 mL via INTRAVENOUS

## 2015-02-17 NOTE — Care Management (Signed)
ED CM met with patient concerning medication assistance. CM made patient aware that he is not eligible for medication assistance, because he has health insurance, patient verbalized understanding but, he states he cannot afford his co-pays. Discussed the [place] Management services, patient is amendable. [facility] consult placed. Explained to patient that [facility] will contact him at the verified number in the patient's record. Update Dr. York Cerise RN on plan.

## 2015-02-17 NOTE — ED Notes (Signed)
MD at bedside. 

## 2015-02-17 NOTE — ED Provider Notes (Addendum)
CSN: [id number]     Arrival date & time 02/17/15  1310 History   First MD Initiated Contact with Patient 02/17/15 1311     Chief Complaint  Patient presents with  . Shortness of Breath     (Consider location/radiation/quality/duration/timing/severity/associated sxs/prior Treatment) HPI Comments: 64 year old male with past medical history including COPD, CAD, hepatitis C, hypertension, type 2 diabetes mellitus who presents with shortness of breath. The patient was seen on 12/7 for COPD exacerbation and given steroids. He was compliant with the steroid course but since 3 days ago when he finished the steroids, his had progressive worsening of shortness of breath again. He has a dry cough and denies any sputum production. No fevers or chest pain. No vomiting. He has been using his inhalers every 6 hours.  Patient is a 64 y.o. male presenting with shortness of breath. The history is provided by the patient.  Shortness of Breath   Past Medical History  Diagnosis Date  . Hypertension   . Coronary artery disease   . Hepatitis C   . High cholesterol   . Arthritis   . Hepatitis   . COPD (chronic obstructive pulmonary disease) ([place])   . Shortness of breath dyspnea   . Asthma   . Diabetes mellitus     type 2   Past Surgical History  Procedure Laterality Date  . Esophagogastroduodenoscopy  02/09/2011    Procedure: ESOPHAGOGASTRODUODENOSCOPY (EGD);  Surgeon: Missy Sabins, MD;  Location: [facility] ENDOSCOPY;  Service: Endoscopy;  Laterality: N/A;  . Joint replacement  10/22/2009    knee   Family History  Problem Relation Age of Onset  . Stroke Mother   . Diabetes Mother   . Hypertension Mother   . Hyperlipidemia Mother   . Heart disease Father   . Hypertension Father   . Heart attack Father    Social History  Substance Use Topics  . Smoking status: Former Smoker -- 1.50 packs/day for 30 years    Types: Cigarettes    Quit date: 03/19/2013  . Smokeless tobacco: Never Used  . 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    10 Systems reviewed and are negative for acute change except as noted in the HPI.    Allergies  Crestor and Mobic  Home Medications   Prior to Admission medications   Medication Sig Start Date End Date Taking? Authorizing Provider  albuterol (PROVENTIL HFA;VENTOLIN HFA) 108 (90 BASE) MCG/ACT inhaler Inhale 2 puffs into the lungs every 6 (six) hours as needed for wheezing or shortness of breath. 12/15/14  Yes Hoyt Koch, MD  albuterol (PROVENTIL) (2.5 MG/3ML) 0.083% nebulizer solution Take 3 mLs (2.5 mg total) by nebulization every 4 (four) hours as needed for wheezing or shortness of breath. 12/15/14  Yes Hoyt Koch, MD  aspirin EC 81 MG tablet Take 1 tablet (81 mg total) by mouth daily. 08/31/13  Yes Lorayne Marek, MD  cholecalciferol (VITAMIN D) 1000 UNITS tablet Take 1 tablet (1,000 Units total) by mouth daily. 08/20/12  Yes Robbie Lis, MD  dorzolamide-timolol (COSOPT) 22.3-6.8 MG/ML ophthalmic solution Place 1 drop into the left eye 2 (two) times daily.   Yes Historical Provider, MD  DUREZOL 0.05 % EMUL Place 1 drop into the left eye 2 (two) times daily. 1 drop left eye 4 times daily for 2 days (2nd day is 02/09/15) then 1  drop left eye twice daily for 1 month 12/05/14  Yes Historical Provider, MD  glucose blood test strip Use as instructed to check blood sugar four times a day. DXE11.09 12/15/14  Yes Hoyt Koch, MD  hydrOXYzine (ATARAX/VISTARIL) 25 MG tablet Take 1 tablet (25 mg total) by mouth every 6 (six) hours. 09/27/14  Yes Hoyt Koch, MD  Insulin Glargine (LANTUS SOLOSTAR) 100 UNIT/ML Solostar Pen Inject 30 Units into the skin every morning. And pen needles 1/day 02/11/15  Yes Renato Shin, MD  ketorolac (ACULAR) 0.4 % SOLN Place 1 drop into the left eye 4 (four) times daily.  12/08/14  Yes Historical Provider, MD   Lancets MISC Use to test sugar four times daily. DX E11.09 12/15/14  Yes Hoyt Koch, MD  losartan-hydrochlorothiazide (HYZAAR) 100-12.5 MG per tablet Take 1 tablet by mouth daily. 09/27/14  Yes Hoyt Koch, MD  metFORMIN (GLUCOPHAGE) 1000 MG tablet TAKE 1 TABLET TWICE DAILY WITH MEALS 12/15/14  Yes Hoyt Koch, MD  ofloxacin (OCUFLOX) 0.3 % ophthalmic solution Place 1 drop into the left eye 4 (four) times daily.  12/05/14  Yes Historical Provider, MD  rosuvastatin (CRESTOR) 20 MG tablet Take 1 tablet (20 mg total) by mouth daily. 09/27/14  Yes Hoyt Koch, MD  SPIRIVA HANDIHALER 18 MCG inhalation capsule Take 1 puff by mouth daily. 12/05/14  Yes Historical Provider, MD  traZODone (DESYREL) 50 MG tablet TAKE ONE TABLET BY MOUTH AT BEDTIME AS NEEDED FOR SLEEP 02/03/15  Yes Hoyt Koch, MD  Umeclidinium-Vilanterol 62.5-25 MCG/INH AEPB Inhale 1 puff into the lungs daily. 12/15/14  Yes Hoyt Koch, MD  albuterol (PROVENTIL HFA;VENTOLIN HFA) 108 (90 BASE) MCG/ACT inhaler Inhale 1-2 puffs into the lungs every 6 (six) hours as needed for wheezing or shortness of breath. Patient not taking: Reported on 02/17/2015 02/09/15   Charlesetta Shanks, MD  predniSONE (DELTASONE) 10 MG tablet Take 40mg  (4 tablets) by mouth once daily for 3 days, then 30mg  (3 tablets) by mouth once daily for 3 days, then 20mg  (2 tablets) once daily for 3 days, then 10mg  (1 tablet) once daily for 3 days. 02/17/15   Wenda Overland Jeneva Schweizer, MD   BP 118/65 mmHg  Pulse 81  Temp(Src) 98.3 F (36.8 C) (Oral)  Resp 18  Wt 180 lb (81.647 kg)  SpO2 96% Physical Exam  Constitutional: He is oriented to person, place, and time. He appears well-developed and well-nourished.  Dyspneic, diaphoretic, in mild respiratory distress  HENT:  Head: Normocephalic and atraumatic.  Moist mucous membranes  Eyes: Conjunctivae are normal. Pupils are equal, round, and reactive to light.  Neck: Neck supple.   Cardiovascular: Regular rhythm and normal heart sounds.   No murmur heard. Tachycardic  Pulmonary/Chest:  Mildly increased work of breathing with diminished breath sounds in bilateral bases, expiratory wheezes, accessory muscle use  Abdominal: Soft. Bowel sounds are normal. He exhibits no distension. There is no tenderness.  Musculoskeletal: He exhibits no edema.  Neurological: He is alert and oriented to person, place, and time.  Fluent speech  Skin: Skin is warm and dry.  Psychiatric: He has a normal mood and affect. Judgment normal.  Nursing note and vitals reviewed.   ED Course  Procedures (including critical care time) Labs Review Labs Reviewed  BASIC METABOLIC PANEL - Abnormal; Notable for the following:    Sodium 134 (*)    Chloride 98 (*)    Glucose, Bld 430 (*)    All other components within  normal limits  CBC WITH DIFFERENTIAL/PLATELET  TROPONIN I  BRAIN NATRIURETIC PEPTIDE    Imaging Review Dg Chest 2 View  02/17/2015  CLINICAL DATA:  Shortness of breath and COPD EXAM: CHEST - 2 VIEW COMPARISON:  02/09/2015 FINDINGS: The heart size and mediastinal contours are within normal limits. Both lungs are clear. The visualized skeletal structures are unremarkable. IMPRESSION: No active disease. Electronically Signed   By: Inez Catalina M.D.   On: 02/17/2015 14:21   I have personally reviewed and evaluated these lab results as part of my medical decision-making.   EKG Interpretation   Date/Time:  Thursday February 17 2015 13:14:56 EST Ventricular Rate:  113 PR Interval:  174 QRS Duration: 93 QT Interval:  323 QTC Calculation: 443 R Axis:   74 Text Interpretation:  Sinus tachycardia LAE, consider biatrial enlargement  tachycardia new from previous, otherwise unchanged Confirmed by Marnesha Gagen MD,  Shanekia Latella [phone]) on 02/17/2015 1:18:29 PM     Medications  albuterol (PROVENTIL,VENTOLIN) solution continuous neb (0 mg/hr Nebulization Stopped 02/17/15 1450)  albuterol  (PROVENTIL HFA;VENTOLIN HFA) 108 (90 BASE) MCG/ACT inhaler 2 puff (not administered)  magnesium sulfate IVPB 2 g 50 mL (0 g Intravenous Stopped 02/17/15 1440)  methylPREDNISolone sodium succinate (SOLU-MEDROL) 125 mg/2 mL injection 125 mg (125 mg Intravenous Given 02/17/15 1346)  sodium chloride 0.9 % bolus 1,000 mL (1,000 mLs Intravenous New Bag/Given 02/17/15 1538)    MDM   Final diagnoses:  COPD exacerbation ([place])   a shunt presenting with worsening shortness of breath since finishing a steroid course for recent COPD exacerbation. He states that the symptoms are exactly like previous COPD exacerbations. On arrival, the patient was dyspneic and in mild respiratory distress although mentating appropriately. He was mildly tachycardic and tachypneic, O2 sat 95% on room air. Placed on continuous albuterol, gave Solu-Medrol and magnesium, and obtained above labs as well as chest x-ray. EKG showed sinus tachycardia without ischemic changes.  Chest x-ray negative for acute process. Lab work showed hyperglycemia without any other abnormalities including normal troponin and negative BNP. On reexamination a few hours after arrival, the patient appeared comfortable and stated that his breathing was much improved. He has reassuring vital signs with normal O2 saturation on room air. Improved breath sounds on exam. Provided with a longer prednisone taper given that he had exacerbation after his most recent course was discontinued. He states he cannot afford the prednisone therefore I have contacted case management for assistance. Also provided with albuterol inhaler. Discussed use of inhaler every 4-6 hours for the next few days. Return precautions extensively reviewed. The patient voiced understanding and was comfortable with discharge plan. Patient will be discharged after case management assistance.  Sharlett Iles, MD 02/17/15 [phone]  Of note, the patient's blood sugar was elevated to 430 today. I  emphasized importance of drinking extra fluids, watching diet closely, and continuing his new insulin regimen. He is aware that steroids will elevate his blood sugar and he will watch it more closely while on the steroid course.  Sharlett Iles, MD 02/17/15 [phone]

## 2015-02-17 NOTE — Telephone Encounter (Signed)
Patient Name: SHERLOCK CALLERY DOB: [birth year] Initial Comment Caller states having sob and wheezing Nurse Assessment Nurse: Ronnald Ramp, RN, [place] Date/Time (Eastern Time): 02/17/2015 12:28:21 PM Confirm and document reason for call. If symptomatic, describe symptoms. ---Caller states he has been having SOB. He was seen in ED on Thursday and prescribed steroids, symptoms had improved, but worse again today. He is having trouble speaking because of the SOB. Has the patient traveled out of the country within the last 30 days? ---Not Applicable Does the patient have any new or worsening symptoms? ---Yes Will a triage be completed? ---Yes Related visit to physician within the last 2 weeks? ---Yes Does the PT have any chronic conditions? (i.e. diabetes, asthma, etc.) ---Yes List chronic conditions. ---Asthma, Diabetes Is this a behavioral health or substance abuse call? ---No Guidelines Guideline Title Affirmed Question Affirmed Notes Asthma Attack [1] SEVERE asthma attack (e.g., very SOB at rest, speaks in single words, loud wheezes) AND [2] not resolved after 2 nebulizer or inhaler treatments Final Disposition User Go to ED Now Ronnald Ramp, RN, [facility] - ED Disagree/Comply: Comply

## 2015-02-17 NOTE — ED Notes (Addendum)
Patient here with increasing shortness of breath since am. Seen last week for same and took prednisone with relief..  sob again since finishing prednisone., arrived with HHN administered by EMS. Speaking single words, coughing on arrival that he describes as non-productive. Diaphoretic on arrival

## 2015-02-18 ENCOUNTER — Telehealth: Payer: Self-pay | Admitting: Endocrinology

## 2015-02-18 NOTE — Telephone Encounter (Signed)
See note below. I contacted the pt and he stated he started taking prednisone yesterday and since his blood sugar readings have been elevated. At 1 pm today blood sugar was  236 and yesterday at 1 pm 212. Pt is taking 30 units of Lantus every morning.  Please advise, Thanks!

## 2015-02-18 NOTE — Telephone Encounter (Signed)
Pt has been put on steriods so his BS are high please advise

## 2015-02-18 NOTE — Telephone Encounter (Signed)
Increase lantus to 45 units qam Please call 3 days to report cbg's

## 2015-02-18 NOTE — Telephone Encounter (Signed)
Pt advised of note below and voiced understanding.  

## 2015-02-21 ENCOUNTER — Other Ambulatory Visit: Payer: Self-pay

## 2015-02-21 NOTE — Patient Outreach (Signed)
[facility]) Care Management  02/21/2015  [name] [birth year] [id number]  Assessment: referral received per emergency room-visit for COPD exacerbation. Member reports difficulty affording medications. RNCM called for assessment. No answer. HIPPA compliant message left.  Plan: RNCM will follow up call by the end of the week.  Thea Silversmith, RN, MSN, [place] Coordinator Cell: [phone]

## 2015-02-23 ENCOUNTER — Other Ambulatory Visit: Payer: Self-pay

## 2015-02-23 NOTE — Patient Outreach (Signed)
[facility]) Care Management  02/23/2015  ANTONIE FAUSNAUGH Sep 20, 1950 [id number]  Assessment: Referral received per emergency room. RNCM called to discuss care management program and arrange home visit. No answer. Second attempt to reach. HIPPA complaint message left.  Plan: await return call and follow up in 1-2 weeks.  Thea Silversmith, RN, MSN, [place] Coordinator Cell: [phone]

## 2015-02-25 ENCOUNTER — Telehealth: Payer: Self-pay | Admitting: Endocrinology

## 2015-02-25 MED ORDER — INSULIN PEN NEEDLE 32G X 4 MM MISC: Status: DC

## 2015-02-25 NOTE — Telephone Encounter (Signed)
Patient called stating that he would like a refill sent to his pharmacy   Rx: Pen needles   Pharmacy: [facility]    Thank you

## 2015-02-25 NOTE — Telephone Encounter (Signed)
Refill sent to pt's pharmacy. 

## 2015-03-01 ENCOUNTER — Telehealth: Payer: Self-pay | Admitting: Endocrinology

## 2015-03-01 NOTE — Telephone Encounter (Signed)
Called and spoke with pt. New meter, test strips and pen needles are up front for pt to pick up.

## 2015-03-01 NOTE — Telephone Encounter (Signed)
Pt needs test strips and pen needles, he is in the donut hole, they are too expensive with the script, he needs assistance with getting these please advise?

## 2015-03-06 DIAGNOSIS — J189 Pneumonia, unspecified organism: Secondary | ICD-10-CM

## 2015-03-06 HISTORY — DX: Pneumonia, unspecified organism: J18.9

## 2015-03-07 ENCOUNTER — Other Ambulatory Visit: Payer: Self-pay | Admitting: Internal Medicine

## 2015-03-08 ENCOUNTER — Other Ambulatory Visit: Payer: Self-pay | Admitting: Geriatric Medicine

## 2015-03-08 ENCOUNTER — Inpatient Hospital Stay (HOSPITAL_COMMUNITY)
Admission: EM | Admit: 2015-03-08 | Discharge: 2015-03-10 | DRG: 202 | Disposition: A | Discharge status: Home / Self Care | Payer: Commercial Managed Care - HMO | Attending: Internal Medicine | Admitting: Internal Medicine

## 2015-03-08 ENCOUNTER — Emergency Department (HOSPITAL_COMMUNITY): Payer: Commercial Managed Care - HMO

## 2015-03-08 ENCOUNTER — Other Ambulatory Visit: Payer: Self-pay | Admitting: Internal Medicine

## 2015-03-08 ENCOUNTER — Encounter (HOSPITAL_COMMUNITY): Payer: Self-pay | Admitting: Emergency Medicine

## 2015-03-08 DIAGNOSIS — I2510 Atherosclerotic heart disease of native coronary artery without angina pectoris: Secondary | ICD-10-CM | POA: Diagnosis present

## 2015-03-08 DIAGNOSIS — R0600 Dyspnea, unspecified: Secondary | ICD-10-CM | POA: Diagnosis not present

## 2015-03-08 DIAGNOSIS — J4531 Mild persistent asthma with (acute) exacerbation: Secondary | ICD-10-CM | POA: Diagnosis not present

## 2015-03-08 DIAGNOSIS — Z833 Family history of diabetes mellitus: Secondary | ICD-10-CM

## 2015-03-08 DIAGNOSIS — R0602 Shortness of breath: Secondary | ICD-10-CM | POA: Diagnosis not present

## 2015-03-08 DIAGNOSIS — Z87891 Personal history of nicotine dependence: Secondary | ICD-10-CM | POA: Diagnosis not present

## 2015-03-08 DIAGNOSIS — Z7982 Long term (current) use of aspirin: Secondary | ICD-10-CM

## 2015-03-08 DIAGNOSIS — B1920 Unspecified viral hepatitis C without hepatic coma: Secondary | ICD-10-CM | POA: Diagnosis present

## 2015-03-08 DIAGNOSIS — M1990 Unspecified osteoarthritis, unspecified site: Secondary | ICD-10-CM | POA: Diagnosis present

## 2015-03-08 DIAGNOSIS — Z794 Long term (current) use of insulin: Secondary | ICD-10-CM

## 2015-03-08 DIAGNOSIS — E0801 Diabetes mellitus due to underlying condition with hyperosmolarity with coma: Secondary | ICD-10-CM

## 2015-03-08 DIAGNOSIS — F32A Depression, unspecified: Secondary | ICD-10-CM

## 2015-03-08 DIAGNOSIS — E119 Type 2 diabetes mellitus without complications: Secondary | ICD-10-CM | POA: Insufficient documentation

## 2015-03-08 DIAGNOSIS — E785 Hyperlipidemia, unspecified: Secondary | ICD-10-CM | POA: Diagnosis not present

## 2015-03-08 DIAGNOSIS — J441 Chronic obstructive pulmonary disease with (acute) exacerbation: Secondary | ICD-10-CM | POA: Diagnosis not present

## 2015-03-08 DIAGNOSIS — J4521 Mild intermittent asthma with (acute) exacerbation: Secondary | ICD-10-CM | POA: Diagnosis present

## 2015-03-08 DIAGNOSIS — J383 Other diseases of vocal cords: Secondary | ICD-10-CM | POA: Diagnosis present

## 2015-03-08 DIAGNOSIS — F17210 Nicotine dependence, cigarettes, uncomplicated: Secondary | ICD-10-CM | POA: Diagnosis not present

## 2015-03-08 DIAGNOSIS — J440 Chronic obstructive pulmonary disease with acute lower respiratory infection: Secondary | ICD-10-CM | POA: Diagnosis present

## 2015-03-08 DIAGNOSIS — E7800 Pure hypercholesterolemia, unspecified: Secondary | ICD-10-CM | POA: Diagnosis present

## 2015-03-08 DIAGNOSIS — J189 Pneumonia, unspecified organism: Secondary | ICD-10-CM | POA: Diagnosis present

## 2015-03-08 DIAGNOSIS — Z8249 Family history of ischemic heart disease and other diseases of the circulatory system: Secondary | ICD-10-CM | POA: Diagnosis not present

## 2015-03-08 DIAGNOSIS — Z888 Allergy status to other drugs, medicaments and biological substances status: Secondary | ICD-10-CM

## 2015-03-08 DIAGNOSIS — Z823 Family history of stroke: Secondary | ICD-10-CM | POA: Diagnosis not present

## 2015-03-08 DIAGNOSIS — I10 Essential (primary) hypertension: Secondary | ICD-10-CM | POA: Diagnosis present

## 2015-03-08 DIAGNOSIS — I5032 Chronic diastolic (congestive) heart failure: Secondary | ICD-10-CM | POA: Diagnosis not present

## 2015-03-08 DIAGNOSIS — I739 Peripheral vascular disease, unspecified: Secondary | ICD-10-CM

## 2015-03-08 DIAGNOSIS — F329 Major depressive disorder, single episode, unspecified: Secondary | ICD-10-CM

## 2015-03-08 DIAGNOSIS — J45909 Unspecified asthma, uncomplicated: Secondary | ICD-10-CM | POA: Diagnosis present

## 2015-03-08 DIAGNOSIS — R252 Cramp and spasm: Secondary | ICD-10-CM

## 2015-03-08 DIAGNOSIS — R05 Cough: Secondary | ICD-10-CM | POA: Diagnosis not present

## 2015-03-08 HISTORY — DX: Chronic diastolic (congestive) heart failure: I50.32

## 2015-03-08 HISTORY — DX: Pneumonia, unspecified organism: J18.9

## 2015-03-08 HISTORY — DX: Unspecified chronic bronchitis: J42

## 2015-03-08 HISTORY — DX: Type 2 diabetes mellitus without complications: E11.9

## 2015-03-08 LAB — GLUCOSE, CAPILLARY: Glucose-Capillary: 353 mg/dL — ABNORMAL HIGH (ref 65–99)

## 2015-03-08 LAB — CBC WITH DIFFERENTIAL/PLATELET
HEMATOCRIT: 40.6 % (ref 39.0–52.0)
HEMOGLOBIN: 14 g/dL (ref 13.0–17.0)
MCH: 28.9 pg (ref 26.0–34.0)
MCHC: 34.5 g/dL (ref 30.0–36.0)
MCV: 83.9 fL (ref 78.0–100.0)
Platelets: 260 10*3/uL (ref 150–400)
RBC: 4.84 MIL/uL (ref 4.22–5.81)
RDW: 12.9 % (ref 11.5–15.5)
WBC: 6 10*3/uL (ref 4.0–10.5)

## 2015-03-08 LAB — I-STAT TROPONIN, ED: Troponin i, poc: 0 ng/mL (ref 0.00–0.08)

## 2015-03-08 LAB — BRAIN NATRIURETIC PEPTIDE: B Natriuretic Peptide: 18.1 pg/mL (ref 0.0–100.0)

## 2015-03-08 LAB — BASIC METABOLIC PANEL
ANION GAP: 12 (ref 5–15)
BUN: 14 mg/dL (ref 6–20)
CALCIUM: 9.3 mg/dL (ref 8.9–10.3)
CO2: 27 mmol/L (ref 22–32)
Chloride: 100 mmol/L — ABNORMAL LOW (ref 101–111)
Creatinine, Ser: 0.97 mg/dL (ref 0.61–1.24)
GLUCOSE: 101 mg/dL — AB (ref 65–99)
POTASSIUM: 4.1 mmol/L (ref 3.5–5.1)
Sodium: 139 mmol/L (ref 135–145)

## 2015-03-08 LAB — LACTIC ACID, PLASMA: Lactic Acid, Venous: 4 mmol/L (ref 0.5–2.0)

## 2015-03-08 LAB — I-STAT VENOUS BLOOD GAS, ED
Acid-Base Excess: 3 mmol/L — ABNORMAL HIGH (ref 0.0–2.0)
BICARBONATE: 28.2 meq/L — AB (ref 20.0–24.0)
O2 Saturation: 61 %
PCO2 VEN: 44.3 mmHg — AB (ref 45.0–50.0)
PH VEN: 7.412 — AB (ref 7.250–7.300)
PO2 VEN: 32 mmHg (ref 30.0–45.0)
TCO2: 30 mmol/L (ref 0–100)

## 2015-03-08 LAB — PROCALCITONIN: Procalcitonin: <0.1 ng/mL

## 2015-03-08 MED ORDER — SODIUM CHLORIDE 0.9 % IJ SOLN
3.0000 mL | Freq: Two times a day (BID) | INTRAMUSCULAR | Status: DC
  Administered 2015-03-08 – 2015-03-10 (×4): 3 mL via INTRAVENOUS

## 2015-03-08 MED ORDER — PREDNISONE 20 MG PO TABS
60.0000 mg | ORAL_TABLET | Freq: Once | ORAL | Status: AC
  Administered 2015-03-08: 60 mg via ORAL
  Filled 2015-03-08: qty 3

## 2015-03-08 MED ORDER — INSULIN ASPART 100 UNIT/ML ~~LOC~~ SOLN
0.0000 [IU] | Freq: Three times a day (TID) | SUBCUTANEOUS | Status: DC
  Administered 2015-03-09: 7 [IU] via SUBCUTANEOUS
  Administered 2015-03-09: 11 [IU] via SUBCUTANEOUS
  Administered 2015-03-09: 20 [IU] via SUBCUTANEOUS
  Administered 2015-03-10: 11 [IU] via SUBCUTANEOUS

## 2015-03-08 MED ORDER — IPRATROPIUM-ALBUTEROL 0.5-2.5 (3) MG/3ML IN SOLN
RESPIRATORY_TRACT | Status: AC
  Filled 2015-03-08: qty 3

## 2015-03-08 MED ORDER — SODIUM CHLORIDE 0.9 % IV BOLUS (SEPSIS)
500.0000 mL | Freq: Once | INTRAVENOUS | Status: AC
  Administered 2015-03-08: 500 mL via INTRAVENOUS

## 2015-03-08 MED ORDER — ALBUTEROL (5 MG/ML) CONTINUOUS INHALATION SOLN
10.0000 mg/h | INHALATION_SOLUTION | Freq: Once | RESPIRATORY_TRACT | Status: AC
  Administered 2015-03-08: 10 mg/h via RESPIRATORY_TRACT
  Filled 2015-03-08: qty 20

## 2015-03-08 MED ORDER — IPRATROPIUM-ALBUTEROL 0.5-2.5 (3) MG/3ML IN SOLN
3.0000 mL | Freq: Four times a day (QID) | RESPIRATORY_TRACT | Status: DC
  Filled 2015-03-08 (×3): qty 3

## 2015-03-08 MED ORDER — HYDROCHLOROTHIAZIDE 12.5 MG PO CAPS
12.5000 mg | ORAL_CAPSULE | Freq: Every day | ORAL | Status: DC
  Administered 2015-03-08 – 2015-03-10 (×3): 12.5 mg via ORAL
  Filled 2015-03-08 (×4): qty 1

## 2015-03-08 MED ORDER — TRAZODONE HCL 50 MG PO TABS
50.0000 mg | ORAL_TABLET | Freq: Every evening | ORAL | Status: DC | PRN
  Administered 2015-03-08: 50 mg via ORAL
  Filled 2015-03-08: qty 1

## 2015-03-08 MED ORDER — ENOXAPARIN SODIUM 40 MG/0.4ML ~~LOC~~ SOLN
40.0000 mg | SUBCUTANEOUS | Status: DC
  Administered 2015-03-08 – 2015-03-09 (×2): 40 mg via SUBCUTANEOUS
  Filled 2015-03-08 (×2): qty 0.4

## 2015-03-08 MED ORDER — ALBUTEROL SULFATE (2.5 MG/3ML) 0.083% IN NEBU
INHALATION_SOLUTION | RESPIRATORY_TRACT | Status: AC
  Filled 2015-03-08: qty 6

## 2015-03-08 MED ORDER — ALBUTEROL SULFATE (2.5 MG/3ML) 0.083% IN NEBU
5.0000 mg | INHALATION_SOLUTION | Freq: Once | RESPIRATORY_TRACT | Status: AC
  Administered 2015-03-08: 5 mg via RESPIRATORY_TRACT

## 2015-03-08 MED ORDER — IPRATROPIUM-ALBUTEROL 0.5-2.5 (3) MG/3ML IN SOLN
5.0000 mL | Freq: Once | RESPIRATORY_TRACT | Status: AC
  Administered 2015-03-08: 3 mL via RESPIRATORY_TRACT
  Filled 2015-03-08: qty 3

## 2015-03-08 MED ORDER — LEVOFLOXACIN 750 MG PO TABS
750.0000 mg | ORAL_TABLET | Freq: Every day | ORAL | Status: DC
  Filled 2015-03-08: qty 1

## 2015-03-08 MED ORDER — METHYLPREDNISOLONE SODIUM SUCC 125 MG IJ SOLR: 125.0000 mg | Freq: Once | INTRAMUSCULAR | Status: DC

## 2015-03-08 MED ORDER — GUAIFENESIN-CODEINE 100-10 MG/5ML PO SOLN: ORAL | Status: DC

## 2015-03-08 MED ORDER — GUAIFENESIN-CODEINE 100-10 MG/5ML PO SOLN
5.0000 mL | ORAL | Status: DC | PRN
  Administered 2015-03-08 – 2015-03-09 (×2): 5 mL via ORAL
  Filled 2015-03-08 (×2): qty 5

## 2015-03-08 MED ORDER — IPRATROPIUM-ALBUTEROL 0.5-2.5 (3) MG/3ML IN SOLN
3.0000 mL | Freq: Once | RESPIRATORY_TRACT | Status: AC
  Administered 2015-03-08: 3 mL via RESPIRATORY_TRACT

## 2015-03-08 MED ORDER — IPRATROPIUM-ALBUTEROL 0.5-2.5 (3) MG/3ML IN SOLN
3.0000 mL | RESPIRATORY_TRACT | Status: DC | PRN
  Administered 2015-03-09: 3 mL via RESPIRATORY_TRACT

## 2015-03-08 MED ORDER — METHYLPREDNISOLONE SODIUM SUCC 125 MG IJ SOLR
60.0000 mg | Freq: Four times a day (QID) | INTRAMUSCULAR | Status: DC
  Administered 2015-03-08 – 2015-03-09 (×3): 60 mg via INTRAVENOUS
  Filled 2015-03-08 (×3): qty 2

## 2015-03-08 MED ORDER — SODIUM CHLORIDE 0.9 % IJ SOLN: 3.0000 mL | INTRAMUSCULAR | Status: DC | PRN

## 2015-03-08 MED ORDER — LOSARTAN POTASSIUM 50 MG PO TABS
100.0000 mg | ORAL_TABLET | Freq: Every day | ORAL | Status: DC
  Administered 2015-03-08 – 2015-03-10 (×3): 100 mg via ORAL
  Filled 2015-03-08 (×3): qty 2

## 2015-03-08 MED ORDER — DEXTROSE 5 % IV SOLN
500.0000 mg | INTRAVENOUS | Status: DC
  Administered 2015-03-09 (×2): 500 mg via INTRAVENOUS
  Filled 2015-03-08 (×3): qty 500

## 2015-03-08 MED ORDER — LOSARTAN POTASSIUM-HCTZ 100-12.5 MG PO TABS: 1.0000 | ORAL_TABLET | Freq: Every day | ORAL | Status: DC

## 2015-03-08 MED ORDER — SODIUM CHLORIDE 0.9 % IV SOLN: 250.0000 mL | INTRAVENOUS | Status: DC | PRN

## 2015-03-08 MED ORDER — ASPIRIN EC 81 MG PO TBEC
81.0000 mg | DELAYED_RELEASE_TABLET | Freq: Every day | ORAL | Status: DC
  Administered 2015-03-09 – 2015-03-10 (×2): 81 mg via ORAL
  Filled 2015-03-08 (×3): qty 1

## 2015-03-08 MED ORDER — DEXTROSE 5 % IV SOLN
1.0000 g | INTRAVENOUS | Status: DC
  Administered 2015-03-08 – 2015-03-09 (×2): 1 g via INTRAVENOUS
  Filled 2015-03-08 (×3): qty 10

## 2015-03-08 MED ORDER — HYDROXYZINE HCL 25 MG PO TABS
25.0000 mg | ORAL_TABLET | Freq: Four times a day (QID) | ORAL | Status: DC | PRN
Start: 2015-03-08 — End: 2015-03-10
  Filled 2015-03-08: qty 1

## 2015-03-08 MED ORDER — UMECLIDINIUM-VILANTEROL 62.5-25 MCG/INH IN AEPB: 1.0000 | INHALATION_SPRAY | Freq: Every day | RESPIRATORY_TRACT | Status: DC

## 2015-03-08 MED ORDER — VITAMIN D 1000 UNITS PO TABS
1000.0000 [IU] | ORAL_TABLET | Freq: Every day | ORAL | Status: DC
  Administered 2015-03-08 – 2015-03-09 (×2): 1000 [IU] via ORAL
  Filled 2015-03-08 (×2): qty 1

## 2015-03-08 MED ORDER — HYDRALAZINE HCL 20 MG/ML IJ SOLN: 10.0000 mg | INTRAMUSCULAR | Status: DC | PRN

## 2015-03-08 MED ORDER — INSULIN GLARGINE 100 UNIT/ML ~~LOC~~ SOLN
30.0000 [IU] | Freq: Every day | SUBCUTANEOUS | Status: DC
  Filled 2015-03-08: qty 0.3

## 2015-03-08 NOTE — ED Notes (Signed)
Attempted to call report x 1  

## 2015-03-08 NOTE — ED Provider Notes (Signed)
CSN: [id number]     Arrival date & time 03/08/15  1227 History   None    Chief Complaint  Patient presents with  . Wheezing  . Shortness of Breath   65 year old AASM with PMH of HTN, CAD, hep C, HLD, COPD, and DM 2 who presents today with shortness of breath and cough. Patient endorses he had a similar illness 2 weeks ago and was seen in the ED and started on steroids with resolution. After the steroids completed feels like his symptoms came right back. Has had nonproductive cough, chest pain with cough, decreased sleep due to the cough, and shortness of breath with wheezing. Has tried his inhalers with little relief. He denies any fevers, chills, nausea, vomiting, back pain, abdominal pain, sick contacts, recent travel.  (Consider location/radiation/quality/duration/timing/severity/associated sxs/prior Treatment) Patient is a 65 y.o. male presenting with shortness of breath. The history is provided by the patient.  Shortness of Breath Severity:  Moderate Onset quality:  Sudden Duration:  2 days Timing:  Constant Progression:  Worsening Chronicity:  Recurrent Relieved by:  Nothing Ineffective treatments:  Inhaler Associated symptoms: chest pain, cough, sputum production and wheezing   Associated symptoms: no abdominal pain, no fever, no headaches, no sore throat and no vomiting     Past Medical History  Diagnosis Date  . Hypertension   . Coronary artery disease   . High cholesterol   . COPD (chronic obstructive pulmonary disease) ([place])   . Shortness of breath dyspnea   . Asthma   . Pneumonia 03/2015  . Type II diabetes mellitus ([place])   . Hepatitis C   . Arthritis     "knees, hands" (03/08/2015)  . Chronic diastolic CHF (congestive heart failure) ([place])     notes 03/08/2015  . Chronic bronchitis [facility])    Past Surgical History  Procedure Laterality Date  . Esophagogastroduodenoscopy  02/09/2011    Procedure: ESOPHAGOGASTRODUODENOSCOPY (EGD);  Surgeon: Missy Sabins, MD;  Location:  [facility] ENDOSCOPY;  Service: Endoscopy;  Laterality: N/A;  . Joint replacement    . Total knee arthroplasty Left 10/22/2009  . Cataract extraction Left 02/2014   Family History  Problem Relation Age of Onset  . Stroke Mother   . Diabetes Mother   . Hypertension Mother   . Hyperlipidemia Mother   . Heart disease Father   . Hypertension Father   . Heart attack Father    Social History  Substance Use Topics  . Smoking status: Former Smoker -- 1.50 packs/day for 46 years    Types: Cigarettes    Quit date: 03/19/2013  . Smokeless tobacco: Never Used  . Alcohol Use: 0.0 oz/week    0 Standard drinks or equivalent per week     Comment: "recovering alcoholic; [id number]"    Review of Systems  Constitutional: Negative for fever and chills.  HENT: Negative for sore throat.   Respiratory: Positive for cough, sputum production, shortness of breath and wheezing.   Cardiovascular: Positive for chest pain. Negative for palpitations and leg swelling.  Gastrointestinal: Negative for nausea, vomiting, abdominal pain, diarrhea, constipation and abdominal distention.  Genitourinary: Negative for dysuria, frequency, flank pain and decreased urine volume.  Neurological: Negative for dizziness, speech difficulty, light-headedness and headaches.  All other systems reviewed and are negative.     Allergies  Crestor and Mobic  Home Medications   Prior to Admission medications   Medication Sig Start Date End Date Taking? Authorizing Provider  albuterol (PROVENTIL HFA;VENTOLIN HFA) 108 (90 BASE) MCG/ACT inhaler Inhale  2 puffs into the lungs every 6 (six) hours as needed for wheezing or shortness of breath. 12/15/14  Yes Hoyt Koch, MD  albuterol (PROVENTIL) (2.5 MG/3ML) 0.083% nebulizer solution Take 3 mLs (2.5 mg total) by nebulization every 4 (four) hours as needed for wheezing or shortness of breath. 12/15/14  Yes Hoyt Koch, MD  aspirin EC 81 MG tablet Take 1 tablet (81 mg total)  by mouth daily. 08/31/13  Yes Lorayne Marek, MD  cholecalciferol (VITAMIN D) 1000 UNITS tablet Take 1 tablet (1,000 Units total) by mouth daily. 08/20/12  Yes Robbie Lis, MD  hydrOXYzine (ATARAX/VISTARIL) 25 MG tablet Take 1 tablet (25 mg total) by mouth every 6 (six) hours. 09/27/14  Yes Hoyt Koch, MD  Insulin Glargine (LANTUS SOLOSTAR) 100 UNIT/ML Solostar Pen Inject 30 Units into the skin every morning. And pen needles 1/day Patient taking differently: Inject 30 Units into the skin every morning. And pen needles 1/day 02/11/15  Yes Renato Shin, MD  losartan-hydrochlorothiazide (HYZAAR) 100-12.5 MG per tablet Take 1 tablet by mouth daily. 09/27/14  Yes Hoyt Koch, MD  metFORMIN (GLUCOPHAGE) 1000 MG tablet TAKE 1 TABLET TWICE DAILY WITH MEALS 12/15/14  Yes Hoyt Koch, MD  SPIRIVA HANDIHALER 18 MCG inhalation capsule Take 1 puff by mouth daily. 12/05/14  Yes Historical Provider, MD  traZODone (DESYREL) 50 MG tablet TAKE ONE TABLET BY MOUTH AT BEDTIME AS NEEDED FOR SLEEP 03/08/15  Yes Hoyt Koch, MD  Umeclidinium-Vilanterol 62.5-25 MCG/INH AEPB Inhale 1 puff into the lungs daily. 12/15/14  Yes Hoyt Koch, MD  albuterol (PROVENTIL HFA;VENTOLIN HFA) 108 (90 BASE) MCG/ACT inhaler Inhale 1-2 puffs into the lungs every 6 (six) hours as needed for wheezing or shortness of breath. Patient not taking: Reported on 02/17/2015 02/09/15   Charlesetta Shanks, MD  glucose blood test strip Use as instructed to check blood sugar four times a day. [id number] 12/15/14   Hoyt Koch, MD  guaiFENesin-codeine 100-10 MG/5ML syrup TAKE  5MLS  BY MOUTH ONCE DAILY 03/08/15   Hoyt Koch, MD  Insulin Pen Needle 32G X 4 MM MISC Use to inject insulin 1 time daily. 02/25/15   Renato Shin, MD  Lancets MISC Use to test sugar four times daily. DX E11.09 12/15/14   Hoyt Koch, MD  rosuvastatin (CRESTOR) 20 MG tablet Take 1 tablet (20 mg total) by mouth daily. Patient  not taking: Reported on 03/08/2015 09/27/14   Hoyt Koch, MD   BP 150/71 mmHg  Pulse 95  Temp(Src) 98.1 F (36.7 C) (Oral)  Resp 18  Ht 5\' 6"  (1.676 m)  Wt 78 kg  BMI 27.77 kg/m2  SpO2 96% Physical Exam  Constitutional: He is oriented to person, place, and time. He appears well-developed and well-nourished. No distress.  HENT:  Head: Normocephalic and atraumatic.  Eyes: Pupils are equal, round, and reactive to light.  Neck: Normal range of motion.  Cardiovascular: Normal rate, regular rhythm, normal heart sounds and intact distal pulses.  Exam reveals no gallop and no friction rub.   No murmur heard. Pulmonary/Chest: Effort normal. No respiratory distress. He has wheezes. He has no rales. He exhibits no tenderness.  Abdominal: Soft. Bowel sounds are normal. He exhibits no distension and no mass. There is no tenderness. There is no rebound and no guarding.  Musculoskeletal: Normal range of motion.  Lymphadenopathy:    He has no cervical adenopathy.  Neurological: He is alert and oriented to person, place, and  time. No cranial nerve deficit. Coordination normal.  Skin: Skin is warm and dry. He is not diaphoretic.  Nursing note and vitals reviewed.   ED Course  Procedures (including critical care time) Labs Review Labs Reviewed  BASIC METABOLIC PANEL - Abnormal; Notable for the following:    Chloride 100 (*)    Glucose, Bld 101 (*)    All other components within normal limits  LACTIC ACID, PLASMA - Abnormal; Notable for the following:    Lactic Acid, Venous 4.0 (*)    All other components within normal limits  GLUCOSE, CAPILLARY - Abnormal; Notable for the following:    Glucose-Capillary 353 (*)    All other components within normal limits  I-STAT VENOUS BLOOD GAS, ED - Abnormal; Notable for the following:    pH, Ven 7.412 (*)    pCO2, Ven 44.3 (*)    Bicarbonate 28.2 (*)    Acid-Base Excess 3.0 (*)    All other components within normal limits  CULTURE, BLOOD  (ROUTINE X 2)  CULTURE, BLOOD (ROUTINE X 2)  CULTURE, EXPECTORATED SPUTUM-ASSESSMENT  GRAM STAIN  CBC WITH DIFFERENTIAL/PLATELET  BRAIN NATRIURETIC PEPTIDE  PROCALCITONIN  HIV ANTIBODY (ROUTINE TESTING)  STREP PNEUMONIAE URINARY ANTIGEN  LEGIONELLA ANTIGEN, URINE  BASIC METABOLIC PANEL  CBC WITH DIFFERENTIAL/PLATELET  HEMOGLOBIN A1C  LACTIC ACID, PLASMA  I-STAT TROPOININ, ED    Imaging Review Dg Chest 2 View  03/08/2015  CLINICAL DATA:  Cough and wheezing ; shortness of breath EXAM: CHEST  2 VIEW COMPARISON:  February 17, 2015 and November 09, 2014 FINDINGS: There is focal airspace opacity with volume loss in the right middle lobe, better seen on the lateral view. Lungs elsewhere are clear. Heart size and pulmonary vascularity are normal. No adenopathy. No bone lesions. IMPRESSION: Right middle lobe infiltrate. Lungs elsewhere clear. Cardiac silhouette within normal limits. Electronically Signed   By: Lowella Grip III M.D.   On: 03/08/2015 14:01   I have personally reviewed and evaluated these images and lab results as part of my medical decision-making.   EKG Interpretation   Date/Time:  Tuesday March 08 2015 15:44:51 EST Ventricular Rate:  96 PR Interval:  150 QRS Duration: 87 QT Interval:  358 QTC Calculation: 452 R Axis:   86 Text Interpretation:  Sinus rhythm Borderline right axis deviation No  significant change since last tracing Confirmed by Maryan Rued  MD, Loree Fee  [phone]) on 03/08/2015 4:40:33 PM      MDM   Final diagnoses:  COPD exacerbation ([place])  CAP (community acquired pneumonia)   65 year old male here with shortness of breath. See history of present illness for details. On exam patient in NAD, afebrile, vital signs stable. Minor tachypnea and Axid for a wheezing throughout. Chest x-ray shows right middle lung infiltrate. Likely COPD exacerbation plus CAP. Given steroids, breathing treatment. Will cover for community acquired pneumonia.  Still wheezing,  given 1 hour continuous nebs.   After nebs, still wheezing, but some improvement. Admit for con't treatment.    Pt was seen under the supervision of Dr. Maryan Rued.     Sherian Maroon, MD 03/09/15 [id number]  Blanchie Dessert, MD 03/09/15 2155

## 2015-03-08 NOTE — Progress Notes (Signed)
Notified hospitalilst of elevated blood sugar. Orders given to administer 5 units of Novolog.

## 2015-03-08 NOTE — ED Notes (Signed)
Pt here with cough and SOB; pt sts hx of COPD and has wheezing noted

## 2015-03-08 NOTE — Telephone Encounter (Signed)
Duplicate see other refill encounter for cough syrup...[name]

## 2015-03-08 NOTE — Telephone Encounter (Signed)
Script faxed back to [place]...[name]

## 2015-03-08 NOTE — Progress Notes (Signed)
CRITICAL VALUE ALERT  Critical value received:  Lactic Acid -4.0  Date of notification:  03/08/2015  Time of notification:  2159  Critical value read back:Yes.    Nurse who received alert:  Jannifer Rodney  MD notified (1st page):  Secundino Ginger  Time of first page:  2201 via text page  MD notified (2nd page):n/a  Time of second page:n/a  Responding MD:  Dana Allan, NP   Time MD responded:  2205

## 2015-03-08 NOTE — Progress Notes (Signed)
Patient transferred from the ED via stretcher to room 3E02. Oriented and educated patient to the heart failure floor and the room eqipment. Nurse Tech applied Telemetry monitor onto patient. Nurse tech verified placement with CMT. Nurse was the second verification call to CMT. Currently patient complains of no pain but is a little tachypneic. Will continue to monitor patient to end of shift.

## 2015-03-08 NOTE — H&P (Signed)
[facility] History and Physical  [name] [id number] DOB: Apr 13, 1950 DOA: 03/08/2015  Referring physician: ED physician PCP: Hoyt Koch, MD  Specialists:   Chief Complaint: Cough, SOB, wheezing   HPI: [name] is a 65 y.o. male with PMH of insulin-dependent diabetes mellitus, hypertension, chronic diastolic CHF, and chronic obstructive lung disease presenting with a week of progressively worsening dyspnea with nonproductive cough and wheezing. He was seen in the ED 2 weeks ago under very similar circumstances and discharged home with inhalers and prednisone. He enjoyed good improvement initially, but reports that within 2 days of finishing the steroid, the symptoms returned and if since worsened. He describes dyspnea, worse with exertion or lying flat, and minimally improved with home nebulizer treatments. He reports this is constant, progressively worsening, and associated with nonproductive cough and wheezing. He denies chest pain, palpitations, edema, or PND. He denies fevers, chills, or sick contacts. There is no leg swelling, tenderness, or hemoptysis. Patient has 30-pack-year smoking history, abstaining for 2 years now, with several years of intermittent respiratory problems with unclear diagnosis. He was evaluated by Dr. Lake Bells of pulmonology, and per his notes, PFTs are negative for COPD and more consistent with a mild intermittent asthma and vocal cord dysfunction. Dr. Lake Bells is said that this patient does not have COPD. He has been treated with scheduled combination LABA and steroid inhalers, and albuterol MDI and nebulizer when necessary.  In ED, patient was found to be afebrile, saturating adequately on room air, with modest elevation in blood pressure and a sinus tachycardia in the low 100s. Initial blood work was fairly unremarkable and included in undetectable troponin. EKG was also unremarkable, but two-view chest x-ray features a conspicuous right middle  lobe infiltrate. Patient was given hour-long continuous neb treatment and systemic steroids in the ED but continued to have labored breathing and diminished breath sounds. Hospitalists were asked to admit.  Where does patient live?   At home   Can patient participate in ADLs?  Yes        Review of Systems:   General: no fevers, chills, sweats, weight change, poor appetite, or fatigue HEENT: no blurry vision, hearing changes or sore throat Pulm: Non-productive cough, dyspnea, wheeze CV: no chest pain or palpitations Abd: no nausea, vomiting, abdominal pain, diarrhea, or constipation GU: no dysuria, hematuria, increased urinary frequency, or urgency  Ext: no leg edema Neuro: no focal weakness, numbness, or tingling, no vision change or hearing loss Skin: no rash, no wounds MSK: No muscle spasm, no deformity, no red, hot, or swollen joint Heme: No easy bruising or bleeding Travel history: No recent long distant travel    Allergy:  Allergies  Allergen Reactions  . Crestor [Rosuvastatin] Other (See Comments)    intolerance  . Mobic [Meloxicam] Rash    Past Medical History  Diagnosis Date  . Hypertension   . Coronary artery disease   . Hepatitis C   . High cholesterol   . Arthritis   . Hepatitis   . COPD (chronic obstructive pulmonary disease) ([place])   . Shortness of breath dyspnea   . Asthma   . Diabetes mellitus     type 2    Past Surgical History  Procedure Laterality Date  . Esophagogastroduodenoscopy  02/09/2011    Procedure: ESOPHAGOGASTRODUODENOSCOPY (EGD);  Surgeon: Missy Sabins, MD;  Location: [facility] ENDOSCOPY;  Service: Endoscopy;  Laterality: N/A;  . Joint replacement  10/22/2009    knee    Social History:  reports that he quit smoking about 1 years ago. His smoking use included Cigarettes. He has a 45 pack-year smoking history. He has never used smokeless tobacco. He reports that he does not drink alcohol or use illicit drugs.  Family History:  Family History   Problem Relation Age of Onset  . Stroke Mother   . Diabetes Mother   . Hypertension Mother   . Hyperlipidemia Mother   . Heart disease Father   . Hypertension Father   . Heart attack Father      Prior to Admission medications   Medication Sig Start Date End Date Taking? Authorizing Provider  albuterol (PROVENTIL HFA;VENTOLIN HFA) 108 (90 BASE) MCG/ACT inhaler Inhale 2 puffs into the lungs every 6 (six) hours as needed for wheezing or shortness of breath. 12/15/14  Yes Hoyt Koch, MD  albuterol (PROVENTIL) (2.5 MG/3ML) 0.083% nebulizer solution Take 3 mLs (2.5 mg total) by nebulization every 4 (four) hours as needed for wheezing or shortness of breath. 12/15/14  Yes Hoyt Koch, MD  aspirin EC 81 MG tablet Take 1 tablet (81 mg total) by mouth daily. 08/31/13  Yes Lorayne Marek, MD  cholecalciferol (VITAMIN D) 1000 UNITS tablet Take 1 tablet (1,000 Units total) by mouth daily. 08/20/12  Yes Robbie Lis, MD  hydrOXYzine (ATARAX/VISTARIL) 25 MG tablet Take 1 tablet (25 mg total) by mouth every 6 (six) hours. 09/27/14  Yes Hoyt Koch, MD  Insulin Glargine (LANTUS SOLOSTAR) 100 UNIT/ML Solostar Pen Inject 30 Units into the skin every morning. And pen needles 1/day Patient taking differently: Inject 30 Units into the skin every morning. And pen needles 1/day 02/11/15  Yes Renato Shin, MD  losartan-hydrochlorothiazide (HYZAAR) 100-12.5 MG per tablet Take 1 tablet by mouth daily. 09/27/14  Yes Hoyt Koch, MD  metFORMIN (GLUCOPHAGE) 1000 MG tablet TAKE 1 TABLET TWICE DAILY WITH MEALS 12/15/14  Yes Hoyt Koch, MD  SPIRIVA HANDIHALER 18 MCG inhalation capsule Take 1 puff by mouth daily. 12/05/14  Yes Historical Provider, MD  traZODone (DESYREL) 50 MG tablet TAKE ONE TABLET BY MOUTH AT BEDTIME AS NEEDED FOR SLEEP 03/08/15  Yes Hoyt Koch, MD  Umeclidinium-Vilanterol 62.5-25 MCG/INH AEPB Inhale 1 puff into the lungs daily. 12/15/14  Yes Hoyt Koch, MD  albuterol (PROVENTIL HFA;VENTOLIN HFA) 108 (90 BASE) MCG/ACT inhaler Inhale 1-2 puffs into the lungs every 6 (six) hours as needed for wheezing or shortness of breath. Patient not taking: Reported on 02/17/2015 02/09/15   Charlesetta Shanks, MD  glucose blood test strip Use as instructed to check blood sugar four times a day. [id number] 12/15/14   Hoyt Koch, MD  guaiFENesin-codeine 100-10 MG/5ML syrup TAKE  5MLS  BY MOUTH ONCE DAILY 03/08/15   Hoyt Koch, MD  Insulin Pen Needle 32G X 4 MM MISC Use to inject insulin 1 time daily. 02/25/15   Renato Shin, MD  Lancets MISC Use to test sugar four times daily. DX E11.09 12/15/14   Hoyt Koch, MD  rosuvastatin (CRESTOR) 20 MG tablet Take 1 tablet (20 mg total) by mouth daily. Patient not taking: Reported on 03/08/2015 09/27/14   Hoyt Koch, MD    Physical Exam: Filed Vitals:   03/08/15 1745 03/08/15 1800 03/08/15 1815 03/08/15 1830  BP: 140/74 144/82 137/88 153/80  Pulse: 86 84 84 92  Temp:      TempSrc:      Resp: 15 13 20 31   SpO2: 100% 100% 99% 100%  General: Mild respiratory distress with gasping between sentences  HEENT:       Eyes: PERRL, EOMI, no scleral icterus or conjunctival pallor.       ENT: No discharge from the ears or nose, no pharyngeal ulcers, petechiae or exudate, no tonsillar enlargement.        Neck: No JVD, no bruit, no appreciable mass Heme: No cervical adenopathy, no pallor Cardiac: S1/S2, RRR, No murmurs, No gallops or rubs. Pulm: No pallor. Mild tachypnea. Using neck muscles. Gasping between sentences. Breath sounds diminished b/l. Prolonged expiratory phase with b/l wheezes. . Abd: Soft, nondistended, nontender, no rebound pain or gaurding, no mass or organomegaly, BS present. Ext: No LE edema bilaterally. 1+DP/PT pulse bilaterally. Musculoskeletal: No gross deformity, no red, hot, swollen joints, no limitation in ROM  Skin: No rashes or wounds on exposed surfaces   Neuro: Alert, oriented X3, cranial nerves II-XII grossly intact. No focal findings Psych: Patient is not overtly psychotic, mood/affect appropriate.  Labs on Admission:  Basic Metabolic Panel:  Recent Labs Lab 03/08/15 1648  NA 139  K 4.1  CL 100*  CO2 27  GLUCOSE 101*  BUN 14  CREATININE 0.97  CALCIUM 9.3   Liver Function Tests: No results for input(s): AST, ALT, ALKPHOS, BILITOT, PROT, ALBUMIN in the last 168 hours. No results for input(s): LIPASE, AMYLASE in the last 168 hours. No results for input(s): AMMONIA in the last 168 hours. CBC:  Recent Labs Lab 03/08/15 1648  WBC 6.0  HGB 14.0  HCT 40.6  MCV 83.9  PLT 260   Cardiac Enzymes: No results for input(s): CKTOTAL, CKMB, CKMBINDEX, TROPONINI in the last 168 hours.  BNP (last 3 results)  Recent Labs  11/09/14 0937 02/09/15 1305 02/17/15 1330  BNP 21.8 61.7 21.5    ProBNP (last 3 results) No results for input(s): PROBNP in the last 8760 hours.  CBG: No results for input(s): GLUCAP in the last 168 hours.  Radiological Exams on Admission: Dg Chest 2 View  03/08/2015  CLINICAL DATA:  Cough and wheezing ; shortness of breath EXAM: CHEST  2 VIEW COMPARISON:  February 17, 2015 and November 09, 2014 FINDINGS: There is focal airspace opacity with volume loss in the right middle lobe, better seen on the lateral view. Lungs elsewhere are clear. Heart size and pulmonary vascularity are normal. No adenopathy. No bone lesions. IMPRESSION: Right middle lobe infiltrate. Lungs elsewhere clear. Cardiac silhouette within normal limits. Electronically Signed   By: Lowella Grip III M.D.   On: 03/08/2015 14:01    EKG: Independently reviewed: Normal sinus rhythm, QTc 452  Assessment/Plan  1. Obstructive lung disease with acute exacerbation  - Uncertain diagnosis; per pulm notes (based on spirometry), the patient does not have COPD, but rather a mild-intermittent asthma and vocal cord dysfunction  - Breathing is  obstructed on exam and has responded partially to albuterol neb in the ED - Will admit to tele and continue systemic steroid with Solu-Medrol 60 mg IV q6h, DuoNeb scheduled q6h and q2h prn - Suspect this exacerbation triggered by bacterial LRTI and will be treated empirically as below  - Continuous pulse oximetry with supplemental O2 prn sat <92%   2. CAP, organism unknown - Pt afebrile and without leukocytosis, cough is non-productive  - Conspicuous RML infiltrate noted on 2v CXR  - Will treat empirically with Rocephin and azithromycin, tentatively treating for 7 days  - There is some diagnostic uncertainty and procalcitonin sent to help clarify  - Check lactate  and trend  - Check urine for strep pneumo and legionella antigens  - Blood and sputum cultures will be sent  - Tailor abx based on cultures, procalcitonin, and clinical course  3. Vocal cord dysfunction  - Managing PND with fluticasone NS and Atarax  - Continue cough syrup q6h prn     4. Insulin-dependent DM 2 - Holding home metformin  - Continue Lantus 30 units daily, pt takes in am  - CBGs TID with meals and qHS  - Resistant SSI correctional  - Will adjust as appropriate, anticipate need to increase basal coverage while on steroids  - A1c pending    5. Hypertension  - Continue home-dose losartan-HCTZ  - Modestly hypertensive here  - Will treat SBP >180 or DBP >100 with IV push hydralazine prn, add another scheduled med prn    6. Chronic diastolic CHF  - Appears well-compensated  - Pt endorses some orthopnea, but no edema on exam or CXR, no significant JVD, no gallop  - Check BNP - Will SLIV, allow ad lib PO intake  - Strict I/Os, daily wts  - Continuing ARB as above     DVT ppx:   SQ Lovenox    Code Status: Full code Family Communication: None at bed side.    Disposition Plan: Admit to inpatient   Date of Service 03/08/2015    Vianne Bulls, MD [facility] Pager [phone]  If 7PM-7AM, please  contact night-coverage [email or web address] Password [facility] 03/08/2015, 6:54 PM

## 2015-03-09 DIAGNOSIS — J4531 Mild persistent asthma with (acute) exacerbation: Principal | ICD-10-CM

## 2015-03-09 DIAGNOSIS — J383 Other diseases of vocal cords: Secondary | ICD-10-CM

## 2015-03-09 DIAGNOSIS — J189 Pneumonia, unspecified organism: Secondary | ICD-10-CM

## 2015-03-09 DIAGNOSIS — E0801 Diabetes mellitus due to underlying condition with hyperosmolarity with coma: Secondary | ICD-10-CM

## 2015-03-09 DIAGNOSIS — I10 Essential (primary) hypertension: Secondary | ICD-10-CM

## 2015-03-09 DIAGNOSIS — J4521 Mild intermittent asthma with (acute) exacerbation: Secondary | ICD-10-CM

## 2015-03-09 DIAGNOSIS — Z794 Long term (current) use of insulin: Secondary | ICD-10-CM

## 2015-03-09 DIAGNOSIS — I5032 Chronic diastolic (congestive) heart failure: Secondary | ICD-10-CM

## 2015-03-09 LAB — CBC WITH DIFFERENTIAL/PLATELET
BASOS ABS: 0 10*3/uL (ref 0.0–0.1)
Basophils Relative: 0 %
EOS ABS: 0 10*3/uL (ref 0.0–0.7)
EOS PCT: 0 %
HCT: 35.3 % — ABNORMAL LOW (ref 39.0–52.0)
Hemoglobin: 11.7 g/dL — ABNORMAL LOW (ref 13.0–17.0)
LYMPHS PCT: 21 %
Lymphs Abs: 0.7 10*3/uL (ref 0.7–4.0)
MCH: 28.3 pg (ref 26.0–34.0)
MCHC: 33.1 g/dL (ref 30.0–36.0)
MCV: 85.3 fL (ref 78.0–100.0)
MONO ABS: 0 10*3/uL — AB (ref 0.1–1.0)
Monocytes Relative: 1 %
Neutro Abs: 2.8 10*3/uL (ref 1.7–7.7)
Neutrophils Relative %: 78 %
PLATELETS: 227 10*3/uL (ref 150–400)
RBC: 4.14 MIL/uL — ABNORMAL LOW (ref 4.22–5.81)
RDW: 13 % (ref 11.5–15.5)
WBC: 3.6 10*3/uL — ABNORMAL LOW (ref 4.0–10.5)

## 2015-03-09 LAB — BASIC METABOLIC PANEL
Anion gap: 13 (ref 5–15)
BUN: 16 mg/dL (ref 6–20)
CALCIUM: 9 mg/dL (ref 8.9–10.3)
CO2: 22 mmol/L (ref 22–32)
CREATININE: 1.16 mg/dL (ref 0.61–1.24)
Chloride: 100 mmol/L — ABNORMAL LOW (ref 101–111)
GFR calc Af Amer: >60 mL/min (ref >60)
GLUCOSE: 360 mg/dL — AB (ref 65–99)
Potassium: 4.4 mmol/L (ref 3.5–5.1)
Sodium: 135 mmol/L (ref 135–145)

## 2015-03-09 LAB — GLUCOSE, CAPILLARY
GLUCOSE-CAPILLARY: 391 mg/dL — AB (ref 65–99)
GLUCOSE-CAPILLARY: 256 mg/dL — AB (ref 65–99)
Glucose-Capillary: 287 mg/dL — ABNORMAL HIGH (ref 65–99)
Glucose-Capillary: 248 mg/dL — ABNORMAL HIGH (ref 65–99)

## 2015-03-09 LAB — HIV ANTIBODY (ROUTINE TESTING W REFLEX): HIV Screen 4th Generation wRfx: NONREACTIVE

## 2015-03-09 LAB — STREP PNEUMONIAE URINARY ANTIGEN: STREP PNEUMO URINARY ANTIGEN: NEGATIVE

## 2015-03-09 LAB — HEMOGLOBIN A1C
Hgb A1c MFr Bld: 10.6 % — ABNORMAL HIGH (ref 4.8–5.6)
Mean Plasma Glucose: 258 mg/dL

## 2015-03-09 LAB — LACTIC ACID, PLASMA: LACTIC ACID, VENOUS: 4 mmol/L — AB (ref 0.5–2.0)

## 2015-03-09 MED ORDER — INSULIN ASPART 100 UNIT/ML ~~LOC~~ SOLN
5.0000 [IU] | Freq: Three times a day (TID) | SUBCUTANEOUS | Status: DC
  Administered 2015-03-09 – 2015-03-10 (×3): 5 [IU] via SUBCUTANEOUS

## 2015-03-09 MED ORDER — INSULIN GLARGINE 100 UNIT/ML ~~LOC~~ SOLN
45.0000 [IU] | Freq: Every day | SUBCUTANEOUS | Status: DC
  Administered 2015-03-09: 45 [IU] via SUBCUTANEOUS
  Filled 2015-03-09 (×2): qty 0.45

## 2015-03-09 MED ORDER — INSULIN ASPART 100 UNIT/ML ~~LOC~~ SOLN
5.0000 [IU] | Freq: Once | SUBCUTANEOUS | Status: AC
  Administered 2015-03-09: 5 [IU] via SUBCUTANEOUS

## 2015-03-09 MED ORDER — LORAZEPAM 2 MG/ML IJ SOLN
0.5000 mg | Freq: Once | INTRAMUSCULAR | Status: AC
  Administered 2015-03-09: 0.5 mg via INTRAVENOUS
  Filled 2015-03-09: qty 1

## 2015-03-09 MED ORDER — SODIUM CHLORIDE 0.9 % IV SOLN: INTRAVENOUS | Status: DC

## 2015-03-09 MED ORDER — IPRATROPIUM-ALBUTEROL 0.5-2.5 (3) MG/3ML IN SOLN
3.0000 mL | Freq: Four times a day (QID) | RESPIRATORY_TRACT | Status: DC
  Administered 2015-03-09 – 2015-03-10 (×4): 3 mL via RESPIRATORY_TRACT
  Filled 2015-03-09 (×4): qty 3

## 2015-03-09 MED ORDER — METHYLPREDNISOLONE SODIUM SUCC 125 MG IJ SOLR
60.0000 mg | Freq: Two times a day (BID) | INTRAMUSCULAR | Status: DC
  Administered 2015-03-09 – 2015-03-10 (×2): 60 mg via INTRAVENOUS
  Filled 2015-03-09 (×2): qty 2

## 2015-03-09 NOTE — Progress Notes (Signed)
Patient states upset stomach and states usually when he gets a stomach ache he has to go to the bathroom but this time he does not. Patient unable to describe what it feels like but states it feels like an upset stomach. Patient has been up most the night and therefore asked if patient was having any anxiety. Patient stated that he has been worried about his health as far as the COPD exacerbation and pneumonia and may feel that may be causing some angst. Notified hosptialist. Orders given to administer Ativan. Will administer Ativan as ordered and continue to monitor patient to end of shift.

## 2015-03-09 NOTE — Progress Notes (Signed)
Notified of repeat Lactic Acid of 4.0, hospitalist, Dana Allan, was made aware of repeat result. No new orders given at this time.

## 2015-03-09 NOTE — Progress Notes (Signed)
[facility]                                                                              Patient Demographics  [name], is a 65 y.o. male, DOB - Feb 17, 1951, [id number]  Admit date - 03/08/2015   Admitting Physician Briscoe Deutscher, MD  Outpatient Primary MD for the patient is [name], MD  LOS - 1   Chief Complaint  Patient presents with  . Wheezing  . Shortness of Breath       Brief HPI   The patient is a 65 year old male with insulin-dependent diabetes mellitus, hypertension, chronic diastolic CHF, and chronic obstructive lung disease presenting with a week of progressively worsening dyspnea with nonproductive cough and wheezing. 30-pack-year smoking history, follows Dr. Kendrick Fries. ED, patient was found to be afebrile, saturating adequately on room air, with modest elevation in blood pressure and a sinus tachycardia in the low 100s. Initial blood work was fairly unremarkable and included in undetectable troponin. EKG was also unremarkable, but two-view chest x-ray features a conspicuous right middle lobe infiltrate. Patient was admitted for further workup.   Assessment & Plan    Principal Problem: Asthma with vocal cord dysfunction, acute exacerbation  - Uncertain diagnosis; per pulm notes (based on spirometry), the patient does not have COPD, but rather a mild-intermittent asthma and vocal cord dysfunction  - Continue IV Solu-Medrol, decreased to every 12 hours, continue DuoNeb's, Zithromax, Rocephin  CAP, organism unknown -Chest x-ray showed right middle lobe pneumonia, continue IV Zithromax, Rocephin - Follow blood culture, HIV negative, urine strep antigen negative    Vocal cord dysfunction  - Managing PND with fluticasone NS and Atarax  - Continue cough syrup q6h prn     Insulin-dependent DM 2 with uncontrolled hyperglycemia - Holding home metformin  - Increase Lantus to 45 units, add meal coverage with sliding scale  insulin -Hemoglobin A1c 10.6 - Tapered IV Solu-Medrol  Hypertension  - Continue home-dose losartan-HCTZ    chronic diastolic CHF  - Appears well-compensated  - Continue Strict I/Os, daily wts  - Continuing ARB as above   Code Status: Full code  Family Communication: Discussed in detail with the patient, all imaging results, lab results explained to the patient    Disposition Plan: DC in 24-48 hours if improving  Time Spent in minutes   25 minutes  Procedures  CXR  Consults   None   DVT Prophylaxis  Lovenox   Medications  Scheduled Meds: . aspirin EC  81 mg Oral Daily  . azithromycin  500 mg Intravenous Q24H  . cefTRIAXone (ROCEPHIN)  IV  1 g Intravenous Q24H  . cholecalciferol  1,000 Units Oral Daily  . enoxaparin (LOVENOX) injection  40 mg Subcutaneous Q24H  . losartan  100 mg Oral Daily   And  . hydrochlorothiazide  12.5 mg Oral Daily  . insulin aspart  0-20 Units Subcutaneous TID WC  . insulin aspart  5 Units Subcutaneous TID WC  . insulin glargine  45 Units Subcutaneous Daily  . ipratropium-albuterol  3 mL Nebulization Q6H  . methylPREDNISolone (SOLU-MEDROL) injection  60 mg Intravenous Q12H  .  sodium chloride  3 mL Intravenous Q12H   Continuous Infusions: . sodium chloride 75 mL/hr at 03/09/15 0945   PRN Meds:.sodium chloride, guaiFENesin-codeine, hydrALAZINE, hydrOXYzine, ipratropium-albuterol, sodium chloride, traZODone   Antibiotics   Anti-infectives    Start     Dose/Rate Route Frequency Ordered Stop   03/08/15 1900  cefTRIAXone (ROCEPHIN) 1 g in dextrose 5 % 50 mL IVPB     1 g 100 mL/hr over 30 Minutes Intravenous Every 24 hours 03/08/15 1852 03/15/15 1859   03/08/15 1900  azithromycin (ZITHROMAX) 500 mg in dextrose 5 % 250 mL IVPB     500 mg 250 mL/hr over 60 Minutes Intravenous Every 24 hours 03/08/15 1852 03/15/15 1859   03/08/15 1815  levofloxacin (LEVAQUIN) tablet 750 mg  Status:  Discontinued     750 mg Oral Daily 03/08/15 1804  03/08/15 1852        Subjective:   [name] was seen and examined today. Coughing with some wheezing, no fevers or chills, overall improving. Patient denies dizziness, chest pain, shortness of breath, abdominal pain, N/V/D/C, new weakness, numbess, tingling. No acute events overnight.    Objective:   Blood pressure 115/64, pulse 95, temperature 98.6 F (37 C), temperature source Oral, resp. rate 18, height 5\' 6"  (1.676 m), weight 76.2 kg (167 lb 15.9 oz), SpO2 95 %.  Wt Readings from Last 3 Encounters:  03/09/15 76.2 kg (167 lb 15.9 oz)  02/17/15 81.647 kg (180 lb)  02/11/15 81.647 kg (180 lb)     Intake/Output Summary (Last 24 hours) at 03/09/15 1058 Last data filed at 03/09/15 0911  Gross per 24 hour  Intake      3 ml  Output    750 ml  Net   -747 ml    Exam  General: Alert and oriented x 3, NAD  HEENT:  PERRLA, EOMI, Anicteric Sclera, mucous membranes moist.   Neck: Supple, no JVD, no masses  CVS: S1 S2 auscultated, no rubs, murmurs or gallops. Regular rate and rhythm.  Respiratory: Bilateral extremity wheezing improving  Abdomen: Soft, nontender, nondistended, + bowel sounds  Ext: no cyanosis clubbing or edema  Neuro: AAOx3, Cr N's II- XII. Strength 5/5 upper and lower extremities bilaterally  Skin: No rashes  Psych: Normal affect and demeanor, alert and oriented x3    Data Review   Micro Results No results found for this or any previous visit (from the past 240 hour(s)).  Radiology Reports Dg Chest 2 View  03/08/2015  CLINICAL DATA:  Cough and wheezing ; shortness of breath EXAM: CHEST  2 VIEW COMPARISON:  February 17, 2015 and November 09, 2014 FINDINGS: There is focal airspace opacity with volume loss in the right middle lobe, better seen on the lateral view. Lungs elsewhere are clear. Heart size and pulmonary vascularity are normal. No adenopathy. No bone lesions. IMPRESSION: Right middle lobe infiltrate. Lungs elsewhere clear. Cardiac silhouette  within normal limits. Electronically Signed   By: Bretta Bang III M.D.   On: 03/08/2015 14:01   Dg Chest 2 View  02/17/2015  CLINICAL DATA:  Shortness of breath and COPD EXAM: CHEST - 2 VIEW COMPARISON:  02/09/2015 FINDINGS: The heart size and mediastinal contours are within normal limits. Both lungs are clear. The visualized skeletal structures are unremarkable. IMPRESSION: No active disease. Electronically Signed   By: Alcide Clever M.D.   On: 02/17/2015 14:21   Dg Chest 2 View  02/09/2015  CLINICAL DATA:  Cough and shortness of breath for 3  days EXAM: CHEST - 2 VIEW COMPARISON:  11/09/2014 FINDINGS: The heart size and mediastinal contours are within normal limits. Both lungs are clear. The visualized skeletal structures are unremarkable. IMPRESSION: No active disease. Electronically Signed   By: Alcide Clever M.D.   On: 02/09/2015 11:02    CBC  Recent Labs Lab 03/08/15 1648 03/09/15 0100  WBC 6.0 3.6*  HGB 14.0 11.7*  HCT 40.6 35.3*  PLT 260 227  MCV 83.9 85.3  MCH 28.9 28.3  MCHC 34.5 33.1  RDW 12.9 13.0  LYMPHSABS  --  0.7  MONOABS  --  0.0*  EOSABS  --  0.0  BASOSABS  --  0.0    Chemistries   Recent Labs Lab 03/08/15 1648 03/09/15 0100  NA 139 135  K 4.1 4.4  CL 100* 100*  CO2 27 22  GLUCOSE 101* 360*  BUN 14 16  CREATININE 0.97 1.16  CALCIUM 9.3 9.0   ------------------------------------------------------------------------------------------------------------------ estimated creatinine clearance is 58.8 mL/min (by C-G formula based on Cr of 1.16). ------------------------------------------------------------------------------------------------------------------  Recent Labs  03/08/15 1930  HGBA1C 10.6*   ------------------------------------------------------------------------------------------------------------------ No results for input(s): CHOL, HDL, LDLCALC, TRIG, CHOLHDL, LDLDIRECT in the last 72  hours. ------------------------------------------------------------------------------------------------------------------ No results for input(s): TSH, T4TOTAL, T3FREE, THYROIDAB in the last 72 hours.  Invalid input(s): FREET3 ------------------------------------------------------------------------------------------------------------------ No results for input(s): VITAMINB12, FOLATE, FERRITIN, TIBC, IRON, RETICCTPCT in the last 72 hours.  Coagulation profile No results for input(s): INR, PROTIME in the last 168 hours.  No results for input(s): DDIMER in the last 72 hours.  Cardiac Enzymes No results for input(s): CKMB, TROPONINI, MYOGLOBIN in the last 168 hours.  Invalid input(s): CK ------------------------------------------------------------------------------------------------------------------ Invalid input(s): POCBNP   Recent Labs  03/08/15 2235 03/09/15 0602  GLUCAP 353* 287*     Charan Prieto M.D. [facility] 03/09/2015, 10:58 AM  Pager: [phone] Between 7am to 7pm - call Pager - [phone]  After 7pm go to [email or web address] - password [email or web address]  Call night coverage person covering after 7pm

## 2015-03-09 NOTE — Plan of Care (Signed)
Problem: Coping: Goal: Level of anxiety will decrease Outcome: Progressing Patient did experience some anxiety during the night. Ativan was given per one time dose order.

## 2015-03-09 NOTE — Progress Notes (Signed)
For informational purposes patient has scarring of what was hives that occur all over the patient's skin from what the patient stated to nurse. Patient also states that the doctors that have looked at it have stated that it is unexplained of why and when it occurs. When it does occur, patient has itching. Patient did have mild itching that occurred right when he had somewhat some anxiety-like feelings but did not want any hydroxyzine when asked. Please make note or be cognizant of when this may occur during such a time. Thanks.

## 2015-03-09 NOTE — Progress Notes (Signed)
PT  Discharge Note  Patient Details Name: [name] MRN: [id number] DOB: [birth year]   Cancelled Treatment:    Reason Eval/Treat Not Completed: Screened, no needs identified, will sign off   Spoke with occupational therapy after initial evaluation. OT reports patient is functioning at a high level of independence and no physical therapy is indicated at this time. PT is signing-off. Please re-order if there is any significant change in status. Thank you for this referral.  Elayne Snare, Egan    Ellouise Newer 03/09/2015, 10:17 AM

## 2015-03-09 NOTE — Evaluation (Addendum)
Occupational Therapy Evaluation Patient Details Name: RODRICK PRIESTLY MRN: [id number] DOB: [birth year] Today's Date: 03/09/2015    History of Present Illness 65 yo admitted with SOB wheezing and coughing PMH of insulin-dependent diabetes mellitus, hypertension, chronic diastolic CHF, and chronic obstructive lung disease    Clinical Impression   Patient evaluated by Occupational Therapy with no further acute OT needs identified. All education has been completed and the patient has no further questions. See below for any follow-up Occupational Therapy or equipment needs. OT to sign off. Thank you for referral.   Spoke with PT Logan about no acute needs.     Follow Up Recommendations  No OT follow up    Equipment Recommendations  None recommended by OT    Recommendations for Other Services       Precautions / Restrictions Precautions Precautions: None      Mobility Bed Mobility Overal bed mobility: Independent                Transfers Overall transfer level: Independent                    Balance Overall balance assessment: Independent                               Standardized Balance Assessment Standardized Balance Assessment : Dynamic Gait Index   Dynamic Gait Index Level Surface: Normal Change in Gait Speed: Normal Gait with Horizontal Head Turns: Normal Gait with Vertical Head Turns: Normal Gait and Pivot Turn: Normal      ADL Overall ADL's : Independent                                       General ADL Comments: pt able to complete balance assessment, oral care, toilet transfer, bed mobility and dynamic standing with no SOB or DOE. HR 112 and oxygen 98 % RA educated on [facility] techniques for adls     Vision     Perception     Praxis      Pertinent Vitals/Pain Pain Assessment: No/denies pain     Hand Dominance Right   Extremity/Trunk Assessment Upper Extremity Assessment Upper Extremity Assessment:  Overall WFL for tasks assessed   Lower Extremity Assessment Lower Extremity Assessment: Overall WFL for tasks assessed   Cervical / Trunk Assessment Cervical / Trunk Assessment: Normal   Communication Communication Communication: No difficulties   Cognition Arousal/Alertness: Awake/alert Behavior During Therapy: WFL for tasks assessed/performed Overall Cognitive Status: Within Functional Limits for tasks assessed                     General Comments       Exercises       Shoulder Instructions      Home Living Family/patient expects to be discharged to:: Private residence Living Arrangements: Alone   Type of Home: House Home Access: Level entry     Home Layout: One level     Bathroom Shower/Tub: Teacher, early years/pre: Standard     Home Equipment: Environmental consultant - 2 wheels;Bedside commode;Cane - single point   Additional Comments: rides the bus for appointments, has shopping cart for pushing groceries instead of carrying them      Prior Functioning/Environment Level of Independence: Independent             OT Diagnosis:  OT Problem List:     OT Treatment/Interventions:      OT Goals(Current goals can be found in the care plan section)    OT Frequency:     Barriers to D/C:            Co-evaluation              End of Session Equipment Utilized During Treatment: Gait belt Nurse Communication: Mobility status;Precautions  Activity Tolerance: Patient tolerated treatment well Patient left: in chair;with call bell/phone within reach   Time: [id number] OT Time Calculation (min): 22 min Charges:  OT General Charges $OT Visit: 1 Procedure OT Evaluation $OT Eval Low Complexity: 1 Procedure G-Codes:    Peri Maris 03/23/15, 10:20 AM   Jeri Modena   OTR/L PagerIP:[phone] Office: [phone] .

## 2015-03-09 NOTE — Progress Notes (Signed)
Nutrition Brief Note  RD consulted for assessment of nutrition status/recommendations.  Wt Readings from Last 15 Encounters:  03/09/15 167 lb 15.9 oz (76.2 kg)  02/17/15 180 lb (81.647 kg)  02/11/15 180 lb (81.647 kg)  01/11/15 175 lb (79.379 kg)  12/15/14 170 lb 12 oz (77.452 kg)  09/27/14 167 lb 1.9 oz (75.805 kg)  08/24/14 174 lb (78.926 kg)  08/17/14 175 lb (79.379 kg)  03/31/14 178 lb 3.2 oz (80.831 kg)  01/21/14 164 lb (74.39 kg)  01/20/14 164 lb 12.8 oz (74.753 kg)  01/13/14 169 lb 12.1 oz (77 kg)  12/30/13 170 lb 6.4 oz (77.293 kg)  12/21/13 172 lb (78.019 kg)  12/08/13 172 lb (78.019 kg)    Body mass index is 27.13 kg/(m^2). Patient meets criteria for Overweight based on current BMI. Pt reports having a good appetite and eating normally. He reports being happy with his current weight; at 180 lbs his abdomen felt distended. He relates elevated hemoglobin AlC to being on prednisone and "cheating" over the holidays. He rpeorts having his insulin dose adjusted multiple times recently due to prednisone and other medications. He usually eats a lot of vegetables, salads, and turkey/chicken. He only drinks water and reports low intake of rice, corn, potatoes, bread etc. He also reports not adding any salt to his food. RD answered patient's questions regarding nutrition and diet.   Current diet order is Carb Modified, patient is consuming approximately 100% of meals at this time. Labs and medications reviewed.   No nutrition interventions warranted at this time. If nutrition issues arise, please consult RD.   Scarlette Ar RD, LDN Inpatient Clinical Dietitian Pager: [phone] After Hours Pager: [phone]

## 2015-03-09 NOTE — Consult Note (Signed)
   [facility] CM Inpatient Consult   03/09/2015  [name] 1951/01/19 [id number] Patient evaluated for community based chronic disease management services with [facility] as a benefit of patient's [facility] [[place] Medicare Insurance.  Patient had been out reached by [facility] staff prior to admission.  Spoke with patient at bedside to explain [facility] services.  Patient states he has a lot of SCAM calls and he screens calls that he does not recognized. Patient states he has been in the Emergency Department a lot but had to stay this time for COPD and Asthma exacerbations.  He would like post hospital follow up with care and disease management.  Patient endorses that Dr. Pricilla Holm is his primary care provider.  He request a voicemail message be left.  Consent form signed.   Patient will receive post hospital discharge call and will be evaluated for monthly home visits for assessments and disease process education.  Left contact information and [facility] literature at bedside. Made Inpatient Case Manager aware that [facility] following. Of note, [facility] Care Management services does not replace or interfere with any services that are arranged by inpatient case management or social work.  For additional questions or referrals please contact:   Natividad Brood, RN BSN [facility] Liaison  [phone] business mobile phone

## 2015-03-10 LAB — LEGIONELLA ANTIGEN, URINE

## 2015-03-10 LAB — GLUCOSE, CAPILLARY: GLUCOSE-CAPILLARY: 280 mg/dL — AB (ref 65–99)

## 2015-03-10 LAB — PROCALCITONIN

## 2015-03-10 MED ORDER — INSULIN GLARGINE 100 UNIT/ML ~~LOC~~ SOLN
50.0000 [IU] | Freq: Every day | SUBCUTANEOUS | Status: DC
  Administered 2015-03-10: 50 [IU] via SUBCUTANEOUS
  Filled 2015-03-10: qty 0.5

## 2015-03-10 MED ORDER — GUAIFENESIN-CODEINE 100-10 MG/5ML PO SOLN: 5.0000 mL | Freq: Four times a day (QID) | ORAL | Status: DC | PRN

## 2015-03-10 MED ORDER — AZITHROMYCIN 500 MG PO TABS: 500.0000 mg | ORAL_TABLET | Freq: Every day | ORAL | Status: DC

## 2015-03-10 MED ORDER — PREDNISONE 50 MG PO TABS: 60.0000 mg | ORAL_TABLET | Freq: Every day | ORAL | Status: DC

## 2015-03-10 MED ORDER — CEFUROXIME AXETIL 500 MG PO TABS: 500.0000 mg | ORAL_TABLET | Freq: Two times a day (BID) | ORAL | Status: DC

## 2015-03-10 MED ORDER — PREDNISONE 10 MG PO TABS: ORAL_TABLET | ORAL | Status: DC

## 2015-03-10 MED ORDER — INSULIN GLARGINE 100 UNIT/ML SOLOSTAR PEN: 50.0000 [IU] | PEN_INJECTOR | SUBCUTANEOUS | Status: DC

## 2015-03-10 MED ORDER — PREDNISONE 20 MG PO TABS: 40.0000 mg | ORAL_TABLET | Freq: Every day | ORAL | Status: DC

## 2015-03-10 NOTE — Progress Notes (Signed)
Utilization review completed. Elfrieda Espino, RN, BSN. 

## 2015-03-10 NOTE — Discharge Summary (Signed)
Physician Discharge Summary   Patient ID: ERASTO KUEHLER MRN: [id number] DOB/AGE: 09/17/1950 65 y.o.  Admit date: 03/08/2015 Discharge date: 03/10/2015  Primary Care Physician:  [name], MD  Discharge Diagnoses:    [name] Mild intermittent asthma with acute exacerbation . Essential hypertension, benign . Chronic diastolic CHF (congestive heart failure) (HCC) . CAP (community acquired pneumonia) . Vocal cord dysfunction . Asthma with exacerbation  Consults:  None  Recommendations for Outpatient Follow-up:  Please repeat CBC/BMET at next visit please follow chest x-ray in 2-3 weeks to ensure complete resolution of pneumonia    DIET:  heart healthy dietergies:   Allergies  Allergen Reactions  . Crestor [Rosuvastatin] Other (See Comments)    intolerance  . Mobic [Meloxicam] Rash     DISCHARGE MEDICATIONS: Current Discharge Medication List    START taking these medications   Details  azithromycin (ZITHROMAX) 500 MG tablet Take 1 tablet (500 mg total) by mouth daily. X 1 week Qty: 7 tablet, Refills: 0    cefUROXime (CEFTIN) 500 MG tablet Take 1 tablet (500 mg total) by mouth 2 (two) times daily with a meal. X 1 week Qty: 14 tablet, Refills: 0    predniSONE (DELTASONE) 10 MG tablet Prednisone dosing: Take  Prednisone 40mg  (4 tabs) x 2 days, then taper to 30mg  (3 tabs) x 3 days, then 20mg  (2 tabs) x 3days, then 10mg  (1 tab) x 3days, then OFF.  Dispense:  26 tabs, refills: None Qty: 26 tablet, Refills: 0      CONTINUE these medications which have CHANGED   Details  guaiFENesin-codeine 100-10 MG/5ML syrup Take 5 mLs by mouth every 6 (six) hours as needed for cough. Qty: 120 mL, Refills: 0    Insulin Glargine (LANTUS SOLOSTAR) 100 UNIT/ML Solostar Pen Inject 50 Units into the skin every morning. And pen needles 1/day Qty: 5 pen, Refills: 4      CONTINUE these medications which have NOT CHANGED   Details  albuterol (PROVENTIL HFA;VENTOLIN HFA) 108 (90 BASE)  MCG/ACT inhaler Inhale 2 puffs into the lungs every 6 (six) hours as needed for wheezing or shortness of breath. Qty: 3 Inhaler, Refills: 1    albuterol (PROVENTIL) (2.5 MG/3ML) 0.083% nebulizer solution Take 3 mLs (2.5 mg total) by nebulization every 4 (four) hours as needed for wheezing or shortness of breath. Qty: 120 mL, Refills: 5   Associated Diagnoses: COPD exacerbation (HCC)    aspirin EC 81 MG tablet Take 1 tablet (81 mg total) by mouth daily. Qty: 30 tablet, Refills: 3    cholecalciferol (VITAMIN D) 1000 UNITS tablet Take 1 tablet (1,000 Units total) by mouth daily. Qty: 30 tablet, Refills: 0    hydrOXYzine (ATARAX/VISTARIL) 25 MG tablet Take 1 tablet (25 mg total) by mouth every 6 (six) hours. Qty: 120 tablet, Refills: 5    losartan-hydrochlorothiazide (HYZAAR) 100-12.5 MG per tablet Take 1 tablet by mouth daily. Qty: 30 tablet, Refills: 6    metFORMIN (GLUCOPHAGE) 1000 MG tablet TAKE 1 TABLET TWICE DAILY WITH MEALS Qty: 60 tablet, Refills: 6    SPIRIVA HANDIHALER 18 MCG inhalation capsule Take 1 puff by mouth daily.    traZODone (DESYREL) 50 MG tablet TAKE ONE TABLET BY MOUTH AT BEDTIME AS NEEDED FOR SLEEP Qty: 30 tablet, Refills: 0    Umeclidinium-Vilanterol 62.5-25 MCG/INH AEPB Inhale 1 puff into the lungs daily. Qty: 60 each, Refills: 6    glucose blood test strip Use as instructed to check blood sugar four times a day. DXE11.09  Qty: 400 each, Refills: 1    Insulin Pen Needle 32G X 4 MM MISC Use to inject insulin 1 time daily. Qty: 100 each, Refills: 1    Lancets MISC Use to test sugar four times daily. DX E11.09 Qty: 400 each, Refills: 1    rosuvastatin (CRESTOR) 20 MG tablet Take 1 tablet (20 mg total) by mouth daily. Qty: 30 tablet, Refills: 6         Brief H and P: For complete details please refer to admission H and P, but in brief The patient is a 65 year old male with insulin-dependent diabetes mellitus, hypertension, chronic diastolic CHF, and  chronic obstructive lung disease presenting with a week of progressively worsening dyspnea with nonproductive cough and wheezing. 30-pack-year smoking history, follows Dr. Kendrick Fries. In ED, patient was found to be afebrile, saturating adequately on room air, with modest elevation in blood pressure and a sinus tachycardia in the low 100s. Initial blood work was fairly unremarkable and included in undetectable troponin. EKG was also unremarkable, but two-view chest x-ray features a conspicuous right middle lobe infiltrate. Patient was admitted for further workup.    Hospital Course:   Asthma with vocal cord dysfunction, acute exacerbation  - Uncertain diagnosis; per pulm notes (based on spirometry), the patient does not have COPD, but rather a mild-intermittent asthma and vocal cord dysfunction  - Patient was placed on IV Solu-Medrol, subsequently tapered, transition to oral prednisone. He was placed on scheduled DuoNeb nebs, IV Zithromax and Rocephin. He is transitioned to oral antibiotics at this time.   CAP, organism unknown -Chest x-ray showed right middle lobe pneumonia,  patient was placed on IV Zithromax, Rocephin -  blood cultures has remained negative.  HIV negative, urine strep antigen negative - Patient transitioned to oral as to max and Ceftin.   Vocal cord dysfunction  - Managing PND with fluticasone NS and Atarax  - Continue cough syrup q6h prn    Insulin-dependent DM 2 with uncontrolled hyperglycemia - Increase Lantus to 50 units, continue oral metformin at home. Patient was recommended to follow up with his endocrinologist for further recommendations.   -Hemoglobin A1c 10.6   Hypertension  - Continue home-dose losartan-HCTZ    chronic diastolic CHF  - Appears well-compensated   Day of Discharge BP 112/70 mmHg  Pulse 85  Temp(Src) 98.3 F (36.8 C) (Oral)  Resp 18  Ht 5\' 6"  (1.676 m)  Wt 77.293 kg (170 lb 6.4 oz)  BMI 27.52 kg/m2  SpO2 97%  Physical  Exam: General: Alert and awake oriented x3 not in any acute distress. HEENT: anicteric sclera, pupils reactive to light and accommodation CVS: S1-S2 clear no murmur rubs or gallops Chest: clear to auscultation bilaterally, no wheezing rales or rhonchi Abdomen: soft nontender, nondistended, normal bowel sounds Extremities: no cyanosis, clubbing or edema noted bilaterally Neuro: Cranial nerves II-XII intact, no focal neurological deficits   The results of significant diagnostics from this hospitalization (including imaging, microbiology, ancillary and laboratory) are listed below for reference.    LAB RESULTS: Basic Metabolic Panel:  Recent Labs Lab 03/08/15 1648 03/09/15 0100  NA 139 135  K 4.1 4.4  CL 100* 100*  CO2 27 22  GLUCOSE 101* 360*  BUN 14 16  CREATININE 0.97 1.16  CALCIUM 9.3 9.0   Liver Function Tests: No results for input(s): AST, ALT, ALKPHOS, BILITOT, PROT, ALBUMIN in the last 168 hours. No results for input(s): LIPASE, AMYLASE in the last 168 hours. No results for input(s): AMMONIA in the  last 168 hours. CBC:  Recent Labs Lab 03/08/15 1648 03/09/15 0100  WBC 6.0 3.6*  NEUTROABS  --  2.8  HGB 14.0 11.7*  HCT 40.6 35.3*  MCV 83.9 85.3  PLT 260 227   Cardiac Enzymes: No results for input(s): CKTOTAL, CKMB, CKMBINDEX, TROPONINI in the last 168 hours. BNP: Invalid input(s): POCBNP CBG:  Recent Labs Lab 03/09/15 2134 03/10/15 0547  GLUCAP 256* 280*    Significant Diagnostic Studies:  Dg Chest 2 View  03/08/2015  CLINICAL DATA:  Cough and wheezing ; shortness of breath EXAM: CHEST  2 VIEW COMPARISON:  February 17, 2015 and November 09, 2014 FINDINGS: There is focal airspace opacity with volume loss in the right middle lobe, better seen on the lateral view. Lungs elsewhere are clear. Heart size and pulmonary vascularity are normal. No adenopathy. No bone lesions. IMPRESSION: Right middle lobe infiltrate. Lungs elsewhere clear. Cardiac silhouette  within normal limits. Electronically Signed   By: Bretta Bang III M.D.   On: 03/08/2015 14:01    2D ECHO:   Disposition and Follow-up: Discharge Instructions    AMB Referral to [facility] Care Management    Complete by:  As directed   Reason for consult:  Post hospital follow up - patient was being called prior to admission  Diagnoses of:   COPD/ Pneumonia Diabetes    Expected date of contact:  1-3 days (reserved for hospital discharges)  Patient previously called prior to admission.  Please follow for transition of care calls and patient states he will likely need a home visit follow up for assessment of disease.  Patient endorses that his medication issues had improved. Please assess for ongoing needs.  For questions: Charlesetta Shanks, RN BSN CCM [facility]  [phone] business mobile phone     Diet Carb Modified    Complete by:  As directed      Discharge instructions    Complete by:  As directed   Please continue Albuterol Nebs three times a day with spiriva daily. You can use albuterol inhaler as needed as rescue inhaler.     Increase activity slowly    Complete by:  As directed             DISPOSITION:  Home   DISCHARGE FOLLOW-UP Follow-up Information    Follow up with [name], MD On 03/18/2015.   Specialty:  Internal Medicine   Why:  @1 ;30pm   Contact information:   [address] [place] [address] [phone]       Follow up with Romero Belling, MD. Schedule an appointment as soon as possible for a visit in 2 weeks.   Specialty:  Endocrinology   Why:  for hospital follow-up   Contact information:   [address] [place] [address] [phone]        Time spent on Discharge: 35 MINS   Signed:   Elisabella Hacker M.D. [facility] 03/10/2015, 11:35 AM Pager: [phone]

## 2015-03-11 ENCOUNTER — Other Ambulatory Visit: Payer: Self-pay

## 2015-03-11 DIAGNOSIS — J441 Chronic obstructive pulmonary disease with (acute) exacerbation: Secondary | ICD-10-CM

## 2015-03-11 NOTE — Patient Outreach (Signed)
[facility]) Care Management  03/11/2015  BOL MASIELLO 1950-06-14 [id number]  Assessment: RNCM called to complete Transition of Care. Member reports that he still has a cough, but is feeling better. Member is rushing off phone to eat and take medications. RNCM discussed medications-member would not go over medications individually, but states he has all the medications ordered except albuteral inhaler-adding he did not leave the hospital with an inhaler and that he cannot afford to pay for this medication. Mr. Montelongo also reports that his doctor had taken him off the nebulizer machine and reports he threw it away.   RNCM reviewed upcoming appointment with primary care and member acknowledged understanding. When asked about his appointment with endocrinologist, member states that he will call to schedule the appointment and adds "I don't have the co-pays for all these appointments". However, he acknowledged that the providers will work with him.   RNCM could not complete transition of care-member rushing to eat and take medications.  Plan: consult [facility] Pharmacist, home visit next week.  Thea Silversmith, RN, MSN, [place] Coordinator Cell: [phone]

## 2015-03-13 LAB — CULTURE, BLOOD (ROUTINE X 2)
CULTURE: NO GROWTH
Culture: NO GROWTH

## 2015-03-14 ENCOUNTER — Other Ambulatory Visit: Payer: Self-pay

## 2015-03-14 NOTE — Patient Outreach (Signed)
I received a phone call from Thea Silversmith, RN in regards to Mr Thrailkill and his need for albuterol.  I reached out to Mr. Parrill and determined that he did have Ventolin inhaler at home and he did have all of his medications.  He did not need [place] to assist in purchasing his medications.  He appreciated me reaching out to him.  He did not have any questions regarding his medications.  I am happy to assist in any other pharmacy related issues that may arise in the future.  I have updated [name].    Deanne Coffer, PharmD, [place] [phone]

## 2015-03-16 ENCOUNTER — Other Ambulatory Visit: Payer: Self-pay

## 2015-03-17 ENCOUNTER — Ambulatory Visit: Payer: Commercial Managed Care - HMO | Admitting: Internal Medicine

## 2015-03-18 ENCOUNTER — Inpatient Hospital Stay: Payer: Commercial Managed Care - HMO | Admitting: Internal Medicine

## 2015-03-18 NOTE — Patient Outreach (Signed)
[facility]) Care Management  [address]  03/16/2015   TRYGVE SHIVELY [birth year] [id number]  Subjective: member reports feeling better since discharge from hospital.  Objective: BP 120/62 mmHg  Pulse 90  Resp 20  Ht 1.676 m (5\' 6" )  Wt 176 lb (79.833 kg)  BMI 28.42 kg/m2  SpO2 100%, lungs clear, heart rate regular  Current Medications:  Current Outpatient Prescriptions  Medication Sig Dispense Refill  . albuterol (PROVENTIL HFA;VENTOLIN HFA) 108 (90 BASE) MCG/ACT inhaler Inhale 2 puffs into the lungs every 6 (six) hours as needed for wheezing or shortness of breath. 3 Inhaler 1  . aspirin EC 81 MG tablet Take 1 tablet (81 mg total) by mouth daily. 30 tablet 3  . cholecalciferol (VITAMIN D) 1000 UNITS tablet Take 1 tablet (1,000 Units total) by mouth daily. 30 tablet 0  . Cinnamon 500 MG capsule Take 500 mg by mouth 2 (two) times daily.    [name] glucose blood test strip Use as instructed to check blood sugar four times a day. DXE11.09 400 each 1  . guaiFENesin-codeine 100-10 MG/5ML syrup Take 5 mLs by mouth every 6 (six) hours as needed for cough. 120 mL 0  . hydrOXYzine (ATARAX/VISTARIL) 25 MG tablet Take 1 tablet (25 mg total) by mouth every 6 (six) hours. 120 tablet 5  . Insulin Glargine (LANTUS SOLOSTAR) 100 UNIT/ML Solostar Pen Inject 50 Units into the skin every morning. And pen needles 1/day 5 pen 4  . Insulin Pen Needle 32G X 4 MM MISC Use to inject insulin 1 time daily. 100 each 1  . Lancets MISC Use to test sugar four times daily. DX E11.09 400 each 1  . losartan-hydrochlorothiazide (HYZAAR) 100-12.5 MG per tablet Take 1 tablet by mouth daily. 30 tablet 6  . metFORMIN (GLUCOPHAGE) 1000 MG tablet TAKE 1 TABLET TWICE DAILY WITH MEALS 60 tablet 6  . Omega-3 Fatty Acids (FISH OIL) 1000 MG CAPS Take 2 capsules by mouth daily.    . predniSONE (DELTASONE) 10 MG tablet Prednisone dosing: Take  Prednisone 40mg  (4 tabs) x 2 days, then taper to 30mg  (3 tabs) x 3  days, then 20mg  (2 tabs) x 3days, then 10mg  (1 tab) x 3days, then OFF.  Dispense:  26 tabs, refills: None 26 tablet 0  . traZODone (DESYREL) 50 MG tablet TAKE ONE TABLET BY MOUTH AT BEDTIME AS NEEDED FOR SLEEP 30 tablet 0  . Umeclidinium-Vilanterol 62.5-25 MCG/INH AEPB Inhale 1 puff into the lungs daily. 60 each 6  . albuterol (PROVENTIL) (2.5 MG/3ML) 0.083% nebulizer solution Take 3 mLs (2.5 mg total) by nebulization every 4 (four) hours as needed for wheezing or shortness of breath. (Patient not taking: Reported on 03/11/2015) 120 mL 5  . azithromycin (ZITHROMAX) 500 MG tablet Take 1 tablet (500 mg total) by mouth daily. X 1 week (Patient not taking: Reported on 03/16/2015) 7 tablet 0  . cefUROXime (CEFTIN) 500 MG tablet Take 1 tablet (500 mg total) by mouth 2 (two) times daily with a meal. X 1 week (Patient not taking: Reported on 03/16/2015) 14 tablet 0  . rosuvastatin (CRESTOR) 20 MG tablet Take 1 tablet (20 mg total) by mouth daily. (Patient not taking: Reported on 03/08/2015) 30 tablet 6  . SPIRIVA HANDIHALER 18 MCG inhalation capsule Take 1 puff by mouth daily. Reported on 03/16/2015     No current facility-administered medications for this visit.    Functional Status:  In your present state of health, do you have any  difficulty performing the following activities: 03/16/2015 03/08/2015  Hearing? N N  Vision? N N  Difficulty concentrating or making decisions? N N  Walking or climbing stairs? Y Y  Dressing or bathing? Y N  Doing errands, shopping? N N  Preparing Food and eating ? N -  Using the Toilet? N -  In the past six months, have you accidently leaked urine? N -  Do you have problems with loss of bowel control? N -  Managing your Medications? N -  Managing your Finances? N -  Housekeeping or managing your Housekeeping? N -    Fall/Depression Screening: PHQ 2/9 Scores 03/16/2015 03/31/2014 08/20/2012  PHQ - 2 Score 0 3 0  PHQ- 9 Score - 6 -   Fall Risk  03/16/2015 03/31/2014  08/20/2012  Falls in the past year? No No No    Assessment: 65 year old with recent admission to hospital for COPD exacerbation. RNCM completed initial home visit. Member reports feeling better.   COPD-denies shortness of breath at this time. RNCM discussed/reviewed COPD action plan and provided EMMI articles to review.  Medications reviewed- Member with no questions. Spiriva noted to be on member's profile, however, member reports he was taken off Spiriva and put on Umeclidinium-Vilanterol. Rosuvastatin also noted to be on profile. Member reports he is allergic to this medication. Member reports insulin(Lantus) was increased to 50 units every morning. He reports that when he went to get his prescription filled the pharmacy could not fill due to he had just pick up his prescription for 30 units and insurance would not cover it. Mr. Hanrahan also has 6 day supply of Metformin left. Reports he will not be able to pay for medications until the first of the month because he had so many new prescriptions to fill when he left the hospital, he is out of money. Member will also need to pick up a supply of Baby Aspirin-He does not have. Member also does not have nebulizer machine or medications stating that was stopped because the nebulizer did not help. Member report he does not have his nebulizer machine anymore. Consult [facility] pharmacist. Also request medication reconciliation.  Plan: [facility] pharmacist referral, telephonic follow up next week. [facility] CM Care Plan Problem One        Most Recent Value   Care Plan Problem One  at risk for readmission   Role Documenting the Problem One  Care Management [place] for Problem One  Active   [facility] Long Term Goal (31-90 days)  member will not be hospitalized within the next 31 days.   [facility] Long Term Goal Start Date  03/11/15   Interventions for Problem One Long Term Goal  home visit, provided [facility] calendar/orgaizer, reviewed COPD action plan.   [facility] CM Short  Term Goal #1 (0-30 days)  member attend follow up appointments as scheduled within the next 30 days.   [facility] CM Short Term Goal #1 Start Date  03/11/15   Interventions for Short Term Goal #1  reviewed upcoming appointments.   [facility] CM Short Term Goal #2 (0-30 days)  member will obtain all medications prescribed within the next 30 days.   THN CM Short Term Goal #2 Start Date  03/11/15   Interventions for Short Term Goal #2  medications reviewed.   THN CM Short Term Goal #3 (0-30 days)  member will review EMMI material and prepare questions within the next 1-2 weeks.   [facility] CM Short Term Goal #3  Start Date  03/16/15   Interventions for Short [place]ern Goal #3  encouraged member to review EMMI articles.     Thea Silversmith, RN, MSN, [place] Coordinator Cell: [phone]

## 2015-03-22 ENCOUNTER — Other Ambulatory Visit: Payer: Self-pay

## 2015-03-22 NOTE — Patient Outreach (Addendum)
[facility]) Care Management  03/22/2015  SEAMUS IVIE 1950-05-31 [id number]   Subjective: "I am doing fine".  Assessment: 65 year old with admission 1/3-1/5 with COPD exacerbation. RNCM currently following member for transition of care. Member reports he is doing well. Denies shortness of breath. Member reports he has all his medications and does not need assistance with obtaining medication at this time. Member reports he has been checking his blood sugars. This morning, his blood sugar was 78. Member reports the high for the week has been around 240. Member states he rescheduled his primary care appointment. Rescheduled to February 9th due to unable to afford the copay, but states he plans to attend his appointment with Endocrinologist scheduled for 1/20. Member has not reviewed EMMI articles, RNCM encouraged member to review and member states he will review articles. Member denies any issues or concerns at this time.  Plan: Transition of care call next week.  Thea Silversmith, RN, MSN, [place] Coordinator Cell: [phone]

## 2015-03-23 ENCOUNTER — Other Ambulatory Visit: Payer: Self-pay

## 2015-03-23 NOTE — Patient Outreach (Signed)
[name] told [name] he is not on Spiriva.  Anoro should have replaced Spiriva since it contains a long acting anticholingeric agent in it as well.    Deanne Coffer, PharmD, [place] [phone]

## 2015-03-23 NOTE — Patient Outreach (Signed)
Subjective:  [name] is a 65 year old male who was referred to me by Kyle Silversmith, RN, nurse care manager to complete a medication review.  [name] has a past medical history of HTN, PVD, ASCVD, PAD, CHF, Asthma, GERD, Diabetes, and Dyslipidemia.  He told [name] he has no issues with his medications and he has no questions in regards to his medications.   Objective:  Current Outpatient Prescriptions on File Prior to Visit  Medication Sig Dispense Refill  . albuterol (PROVENTIL HFA;VENTOLIN HFA) 108 (90 BASE) MCG/ACT inhaler Inhale 2 puffs into the lungs every 6 (six) hours as needed for wheezing or shortness of breath. 3 Inhaler 1  . albuterol (PROVENTIL) (2.5 MG/3ML) 0.083% nebulizer solution Take 3 mLs (2.5 mg total) by nebulization every 4 (four) hours as needed for wheezing or shortness of breath. (Patient not taking: Reported on 03/11/2015) 120 mL 5  . aspirin EC 81 MG tablet Take 1 tablet (81 mg total) by mouth daily. 30 tablet 3  . azithromycin (ZITHROMAX) 500 MG tablet Take 1 tablet (500 mg total) by mouth daily. X 1 week (Patient not taking: Reported on 03/16/2015) 7 tablet 0  . cefUROXime (CEFTIN) 500 MG tablet Take 1 tablet (500 mg total) by mouth 2 (two) times daily with a meal. X 1 week (Patient not taking: Reported on 03/16/2015) 14 tablet 0  . cholecalciferol (VITAMIN D) 1000 UNITS tablet Take 1 tablet (1,000 Units total) by mouth daily. 30 tablet 0  . Cinnamon 500 MG capsule Take 500 mg by mouth 2 (two) times daily.    [name] glucose blood test strip Use as instructed to check blood sugar four times a day. DXE11.09 400 each 1  . guaiFENesin-codeine 100-10 MG/5ML syrup Take 5 mLs by mouth every 6 (six) hours as needed for cough. 120 mL 0  . hydrOXYzine (ATARAX/VISTARIL) 25 MG tablet Take 1 tablet (25 mg total) by mouth every 6 (six) hours. 120 tablet 5  . Insulin Glargine (LANTUS SOLOSTAR) 100 UNIT/ML Solostar Pen Inject 50 Units into the skin every morning. And pen needles 1/day 5 pen 4  .  Insulin Pen Needle 32G X 4 MM MISC Use to inject insulin 1 time daily. 100 each 1  . Lancets MISC Use to test sugar four times daily. DX E11.09 400 each 1  . losartan-hydrochlorothiazide (HYZAAR) 100-12.5 MG per tablet Take 1 tablet by mouth daily. 30 tablet 6  . metFORMIN (GLUCOPHAGE) 1000 MG tablet TAKE 1 TABLET TWICE DAILY WITH MEALS 60 tablet 6  . Omega-3 Fatty Acids (FISH OIL) 1000 MG CAPS Take 2 capsules by mouth daily.    . predniSONE (DELTASONE) 10 MG tablet Prednisone dosing: Take  Prednisone 40mg  (4 tabs) x 2 days, then taper to 30mg  (3 tabs) x 3 days, then 20mg  (2 tabs) x 3days, then 10mg  (1 tab) x 3days, then OFF.  Dispense:  26 tabs, refills: None 26 tablet 0  . rosuvastatin (CRESTOR) 20 MG tablet Take 1 tablet (20 mg total) by mouth daily. (Patient not taking: Reported on 03/08/2015) 30 tablet 6  . SPIRIVA HANDIHALER 18 MCG inhalation capsule Take 1 puff by mouth daily. Reported on 03/16/2015    . traZODone (DESYREL) 50 MG tablet TAKE ONE TABLET BY MOUTH AT BEDTIME AS NEEDED FOR SLEEP 30 tablet 0  . Umeclidinium-Vilanterol 62.5-25 MCG/INH AEPB Inhale 1 puff into the lungs daily. 60 each 6   No current facility-administered medications on file prior to visit.   Assessment:  Drugs sorted  by system:  Neurologic/Psychologic: trazodone  Cardiovascular: aspirin, losartan-HCTZ, omega-3 fatty acids, rosuvastatin  Pulmonary/Allergy: albuterol, guaifenesin-codeine, hydroxyzine, prednisone, sprivia, anoro  Gastrointestinal: none  Endocrine: Lantus, metformin  Renal: none  Topical: none  Pain: none  Vitamins/Minerals: vitamin d, cinnamon  Infectious Diseases: none (those listed on medication list are completed)  Miscellaneous: none   Duplications in therapy: none Gaps in therapy: none Medications to avoid in the elderly: none Drug interactions: none Other issues noted: none  Plan: 1.  No major drug interaction or problems noted.   2.  No side effects from medications  noted to [name] and Kyle. [name] seems to be tolerating his mediations.   3.  I will close him to pharmacy.  If there are future pharmacy needs, I will be happy to assist in the future.    Deanne Coffer, PharmD, [place] [phone]

## 2015-03-25 ENCOUNTER — Ambulatory Visit (INDEPENDENT_AMBULATORY_CARE_PROVIDER_SITE_OTHER): Payer: Commercial Managed Care - HMO | Admitting: Endocrinology

## 2015-03-25 ENCOUNTER — Encounter: Payer: Self-pay | Admitting: Endocrinology

## 2015-03-25 VITALS — BP 128/78 | HR 107 | Temp 98.1°F | Ht 66.0 in | Wt 179.0 lb

## 2015-03-25 DIAGNOSIS — E0801 Diabetes mellitus due to underlying condition with hyperosmolarity with coma: Secondary | ICD-10-CM | POA: Diagnosis not present

## 2015-03-25 DIAGNOSIS — E119 Type 2 diabetes mellitus without complications: Secondary | ICD-10-CM | POA: Diagnosis not present

## 2015-03-25 DIAGNOSIS — Z794 Long term (current) use of insulin: Secondary | ICD-10-CM | POA: Diagnosis not present

## 2015-03-25 MED ORDER — INSULIN GLARGINE 100 UNIT/ML SOLOSTAR PEN: 30.0000 [IU] | PEN_INJECTOR | SUBCUTANEOUS | Status: DC

## 2015-03-25 NOTE — Progress Notes (Signed)
Subjective:    Patient ID: [name], male    DOB: 07/31/1950, 65 y.o.   MRN: [id number]  HPI Pt returns for f/u of diabetes mellitus: DM type: Insulin-requiring type 2 Dx'ed: [id number] Complications: PAD Therapy: insulin since 2016. DKA: never Severe hypoglycemia: never Pancreatitis: never Other: he chooses qd insulin; he is chronically off and on prednisone, for asthma. Interval history: Pt says he has COPD exac every time prednisone is tapered.  He has now been off x 3 days.  He has reduce the lantus to 30 units qd.  no cbg record, but states cbg's vary from 78-200, but most are in the low-100's.   Past Medical History  Diagnosis Date  . Hypertension   . Coronary artery disease   . High cholesterol   . COPD (chronic obstructive pulmonary disease) ([place])   . Shortness of breath dyspnea   . Asthma   . Pneumonia 03/2015  . Type II diabetes mellitus ([place])   . Hepatitis C   . Arthritis     "knees, hands" (03/08/2015)  . Chronic diastolic CHF (congestive heart failure) ([place])     notes 03/08/2015  . Chronic bronchitis [facility])     Past Surgical History  Procedure Laterality Date  . Esophagogastroduodenoscopy  02/09/2011    Procedure: ESOPHAGOGASTRODUODENOSCOPY (EGD);  Surgeon: Missy Sabins, MD;  Location: [facility] ENDOSCOPY;  Service: Endoscopy;  Laterality: N/A;  . Joint replacement    . Total knee arthroplasty Left 10/22/2009  . Cataract extraction Left 02/2014    Social History   Social History  . Marital Status: Divorced    Spouse Name: N/A  . Number of Children: N/A  . Years of Education: N/A   Occupational History  . Not on file.   Social History Main Topics  . Smoking status: Former Smoker -- 1.50 packs/day for 46 years    Types: Cigarettes    Quit date: 03/19/2013  . Smokeless tobacco: Never Used  . Alcohol Use: 0.0 oz/week    0 Standard drinks or equivalent per week     Comment: "recovering alcoholic; [id number]"  . Drug Use: No  . Sexual Activity: Not Currently    Other Topics Concern  . Not on file   Social History Narrative    Current Outpatient Prescriptions on File Prior to Visit  Medication Sig Dispense Refill  . albuterol (PROVENTIL HFA;VENTOLIN HFA) 108 (90 BASE) MCG/ACT inhaler Inhale 2 puffs into the lungs every 6 (six) hours as needed for wheezing or shortness of breath. 3 Inhaler 1  . albuterol (PROVENTIL) (2.5 MG/3ML) 0.083% nebulizer solution Take 3 mLs (2.5 mg total) by nebulization every 4 (four) hours as needed for wheezing or shortness of breath. 120 mL 5  . aspirin EC 81 MG tablet Take 1 tablet (81 mg total) by mouth daily. 30 tablet 3  . cholecalciferol (VITAMIN D) 1000 UNITS tablet Take 1 tablet (1,000 Units total) by mouth daily. 30 tablet 0  . Cinnamon 500 MG capsule Take 500 mg by mouth 2 (two) times daily.    [name] glucose blood test strip Use as instructed to check blood sugar four times a day. DXE11.09 400 each 1  . guaiFENesin-codeine 100-10 MG/5ML syrup Take 5 mLs by mouth every 6 (six) hours as needed for cough. 120 mL 0  . hydrOXYzine (ATARAX/VISTARIL) 25 MG tablet Take 1 tablet (25 mg total) by mouth every 6 (six) hours. 120 tablet 5  . Insulin Pen Needle 32G X 4 MM MISC Use to  inject insulin 1 time daily. 100 each 1  . Lancets MISC Use to test sugar four times daily. DX E11.09 400 each 1  . losartan-hydrochlorothiazide (HYZAAR) 100-12.5 MG per tablet Take 1 tablet by mouth daily. 30 tablet 6  . metFORMIN (GLUCOPHAGE) 1000 MG tablet TAKE 1 TABLET TWICE DAILY WITH MEALS 60 tablet 6  . Omega-3 Fatty Acids (FISH OIL) 1000 MG CAPS Take 2 capsules by mouth daily.    . rosuvastatin (CRESTOR) 20 MG tablet Take 1 tablet (20 mg total) by mouth daily. 30 tablet 6  . SPIRIVA HANDIHALER 18 MCG inhalation capsule Take 1 puff by mouth daily. Reported on 03/16/2015    . traZODone (DESYREL) 50 MG tablet TAKE ONE TABLET BY MOUTH AT BEDTIME AS NEEDED FOR SLEEP 30 tablet 0  . Umeclidinium-Vilanterol 62.5-25 MCG/INH AEPB Inhale 1 puff into  the lungs daily. 60 each 6   No current facility-administered medications on file prior to visit.    Allergies  Allergen Reactions  . Crestor [Rosuvastatin] Other (See Comments)    intolerance  . Mobic [Meloxicam] Rash    Family History  Problem Relation Age of Onset  . Stroke Mother   . Diabetes Mother   . Hypertension Mother   . Hyperlipidemia Mother   . Heart disease Father   . Hypertension Father   . Heart attack Father     BP 128/78 mmHg  Pulse 107  Temp(Src) 98.1 F (36.7 C) (Oral)  Ht 5\' 6"  (1.676 m)  Wt 179 lb (81.194 kg)  BMI 28.91 kg/m2  SpO2 97%  Review of Systems He has weight gain    Objective:   Physical Exam VITAL SIGNS:  See vs page GENERAL: no distress Pulses: dorsalis pedis intact bilat.   MSK: no deformity of the feet CV: no leg edema Skin:  no ulcer on the feet.  normal color and temp on the feet. Neuro: sensation is intact to touch on the feet.    Lab Results  Component Value Date   HGBA1C 10.6* 03/08/2015      Assessment & Plan:  DM: control is improved off steroids.    Patient is advised the following: Patient Instructions  check your blood sugar 4 times per day: before the 3 meals, and at bedtime.  also check if you have symptoms of your blood sugar being too high or too low.  please keep a record of the readings and bring it to your next appointment here (or you can bring the meter itself).  You can write it on any piece of paper.  please call us sooner if your blood sugar goes below 70, or if you have a lot of readings over 200. continue the lantus, 30 units each morning, and:  Please continue the same metformin.   Please call us next week, to tell us how the blood sugar is doing.   Also, call us when you next go on the steroids, so we can adjust the insulin.   Please come back for a follow-up appointment in 6 weeks.

## 2015-03-25 NOTE — Patient Instructions (Addendum)
check your blood sugar 4 times per day: before the 3 meals, and at bedtime.  also check if you have symptoms of your blood sugar being too high or too low.  please keep a record of the readings and bring it to your next appointment here (or you can bring the meter itself).  You can write it on any piece of paper.  please call us sooner if your blood sugar goes below 70, or if you have a lot of readings over 200. continue the lantus, 30 units each morning, and:  Please continue the same metformin.   Please call us next week, to tell us how the blood sugar is doing.   Also, call us when you next go on the steroids, so we can adjust the insulin.   Please come back for a follow-up appointment in 6 weeks.

## 2015-03-28 ENCOUNTER — Other Ambulatory Visit: Payer: Self-pay

## 2015-03-28 NOTE — Patient Outreach (Signed)
[place] [facility]) Care Management  03/28/2015  [name] 06-01-1950 [id number]  Subjective: "Everything is fine. I am breathing ok, I am doing good"  Assessment: Transition of care call. Member reports doing well. He states he has been to see his endocrinologist and that his blood sugars are doing good. RNCM reviewed instructions per endocrinologist notes. Member reports he will follow up with primary care on February 6th and will see his endocrinologist in 6 weeks.  Plan: Transition of care call next week.  Thea Silversmith, RN, MSN, [place] Coordinator Cell: [phone]

## 2015-04-04 ENCOUNTER — Other Ambulatory Visit: Payer: Self-pay

## 2015-04-04 NOTE — Patient Outreach (Signed)
[place] [facility]) Care Management  04/04/2015  [name] 06/08/1950 [id number]  Subjective:" I am doing good, I'm blessed to be here another day".  Transition of care call-Member denies any questions or concerns. Member encouraged to call RNCM as needed. He is also aware of the 24 hour nurse advice line. COPD 3-2-1 Plan reviewed.  Plan: Transition of care call next week.  Thea Silversmith, RN, MSN, [place] Coordinator Cell: [phone]

## 2015-04-07 ENCOUNTER — Telehealth: Payer: Self-pay | Admitting: Endocrinology

## 2015-04-07 ENCOUNTER — Other Ambulatory Visit: Payer: Self-pay

## 2015-04-07 MED ORDER — INSULIN PEN NEEDLE 32G X 6 MM MISC: Status: DC

## 2015-04-07 NOTE — Telephone Encounter (Signed)
Patient called stating that the pen needles that were sent to his pharmacy are too expensive  He would like to know the size of the sample ones he got from [place]  Please advise    Thank you

## 2015-04-07 NOTE — Telephone Encounter (Signed)
I contacted the pt and advised we have sent another pen needle size to his pharmacy.

## 2015-04-08 ENCOUNTER — Ambulatory Visit: Payer: Self-pay

## 2015-04-11 ENCOUNTER — Other Ambulatory Visit: Payer: Self-pay

## 2015-04-11 NOTE — Patient Outreach (Signed)
[place] [facility]) Care Management  04/11/2015  JT HAEFS 1951/01/20 [id number]  Transition of care call. Member denies any questions, concerns or issues regarding his health at this time. Member reports that he is in his hometown of [place] with his daughter who's husband had a heart attack. Member expressed he is glad he is able to be there to support his daughter and states she is his main concern at this time. Member states his health is well. Member denies shortness of breath, reports he has all of his medications, and is following up with providers as scheduled. Transition of care program complete.   RNCM discussed health coach. Member declines at this time. Member is aware that he can call RNCM as needed.  RNCM also reinforced 24 hour nurse advice line availability as needed.  Plan: close case.  Thea Silversmith, RN, MSN, [place] Coordinator Cell: [phone]

## 2015-04-14 ENCOUNTER — Inpatient Hospital Stay: Payer: Commercial Managed Care - HMO | Admitting: Internal Medicine

## 2015-04-21 ENCOUNTER — Other Ambulatory Visit: Payer: Self-pay | Admitting: Internal Medicine

## 2015-04-25 ENCOUNTER — Inpatient Hospital Stay (HOSPITAL_COMMUNITY)
Admission: EM | Admit: 2015-04-25 | Discharge: 2015-04-27 | DRG: 190 | Disposition: A | Discharge status: Home / Self Care | Payer: Commercial Managed Care - HMO | Attending: Internal Medicine | Admitting: Internal Medicine

## 2015-04-25 ENCOUNTER — Encounter (HOSPITAL_COMMUNITY): Payer: Self-pay

## 2015-04-25 ENCOUNTER — Emergency Department (HOSPITAL_COMMUNITY): Payer: Commercial Managed Care - HMO

## 2015-04-25 DIAGNOSIS — I10 Essential (primary) hypertension: Secondary | ICD-10-CM | POA: Diagnosis not present

## 2015-04-25 DIAGNOSIS — Z794 Long term (current) use of insulin: Secondary | ICD-10-CM

## 2015-04-25 DIAGNOSIS — I5032 Chronic diastolic (congestive) heart failure: Secondary | ICD-10-CM | POA: Diagnosis not present

## 2015-04-25 DIAGNOSIS — I110 Hypertensive heart disease with heart failure: Secondary | ICD-10-CM | POA: Diagnosis present

## 2015-04-25 DIAGNOSIS — M1990 Unspecified osteoarthritis, unspecified site: Secondary | ICD-10-CM | POA: Diagnosis present

## 2015-04-25 DIAGNOSIS — E1165 Type 2 diabetes mellitus with hyperglycemia: Secondary | ICD-10-CM | POA: Diagnosis present

## 2015-04-25 DIAGNOSIS — B1920 Unspecified viral hepatitis C without hepatic coma: Secondary | ICD-10-CM | POA: Diagnosis present

## 2015-04-25 DIAGNOSIS — T380X5A Adverse effect of glucocorticoids and synthetic analogues, initial encounter: Secondary | ICD-10-CM | POA: Diagnosis present

## 2015-04-25 DIAGNOSIS — I2510 Atherosclerotic heart disease of native coronary artery without angina pectoris: Secondary | ICD-10-CM | POA: Diagnosis present

## 2015-04-25 DIAGNOSIS — R05 Cough: Secondary | ICD-10-CM | POA: Diagnosis not present

## 2015-04-25 DIAGNOSIS — J4521 Mild intermittent asthma with (acute) exacerbation: Secondary | ICD-10-CM | POA: Diagnosis not present

## 2015-04-25 DIAGNOSIS — J440 Chronic obstructive pulmonary disease with acute lower respiratory infection: Principal | ICD-10-CM | POA: Diagnosis present

## 2015-04-25 DIAGNOSIS — Y95 Nosocomial condition: Secondary | ICD-10-CM | POA: Diagnosis present

## 2015-04-25 DIAGNOSIS — Z833 Family history of diabetes mellitus: Secondary | ICD-10-CM

## 2015-04-25 DIAGNOSIS — Z7982 Long term (current) use of aspirin: Secondary | ICD-10-CM | POA: Diagnosis not present

## 2015-04-25 DIAGNOSIS — Z87891 Personal history of nicotine dependence: Secondary | ICD-10-CM | POA: Diagnosis not present

## 2015-04-25 DIAGNOSIS — K219 Gastro-esophageal reflux disease without esophagitis: Secondary | ICD-10-CM | POA: Diagnosis present

## 2015-04-25 DIAGNOSIS — J441 Chronic obstructive pulmonary disease with (acute) exacerbation: Secondary | ICD-10-CM | POA: Diagnosis not present

## 2015-04-25 DIAGNOSIS — R000 Tachycardia, unspecified: Secondary | ICD-10-CM | POA: Diagnosis present

## 2015-04-25 DIAGNOSIS — E872 Acidosis: Secondary | ICD-10-CM | POA: Diagnosis present

## 2015-04-25 DIAGNOSIS — E7800 Pure hypercholesterolemia, unspecified: Secondary | ICD-10-CM | POA: Diagnosis present

## 2015-04-25 DIAGNOSIS — Z823 Family history of stroke: Secondary | ICD-10-CM | POA: Diagnosis not present

## 2015-04-25 DIAGNOSIS — J189 Pneumonia, unspecified organism: Secondary | ICD-10-CM | POA: Diagnosis present

## 2015-04-25 DIAGNOSIS — Z8249 Family history of ischemic heart disease and other diseases of the circulatory system: Secondary | ICD-10-CM

## 2015-04-25 DIAGNOSIS — Z96652 Presence of left artificial knee joint: Secondary | ICD-10-CM | POA: Diagnosis present

## 2015-04-25 DIAGNOSIS — G4700 Insomnia, unspecified: Secondary | ICD-10-CM | POA: Diagnosis present

## 2015-04-25 DIAGNOSIS — R0602 Shortness of breath: Secondary | ICD-10-CM | POA: Diagnosis not present

## 2015-04-25 DIAGNOSIS — E0801 Diabetes mellitus due to underlying condition with hyperosmolarity with coma: Secondary | ICD-10-CM

## 2015-04-25 LAB — I-STAT TROPONIN, ED: Troponin i, poc: 0 ng/mL (ref 0.00–0.08)

## 2015-04-25 LAB — GLUCOSE, CAPILLARY
GLUCOSE-CAPILLARY: 146 mg/dL — AB (ref 65–99)
GLUCOSE-CAPILLARY: 249 mg/dL — AB (ref 65–99)

## 2015-04-25 LAB — CBC WITH DIFFERENTIAL/PLATELET
Basophils Absolute: 0.1 10*3/uL (ref 0.0–0.1)
Basophils Relative: 1 %
EOS PCT: 20 %
Eosinophils Absolute: 1.3 10*3/uL — ABNORMAL HIGH (ref 0.0–0.7)
HCT: 41.2 % (ref 39.0–52.0)
Hemoglobin: 13.7 g/dL (ref 13.0–17.0)
LYMPHS ABS: 2.8 10*3/uL (ref 0.7–4.0)
LYMPHS PCT: 44 %
MCH: 28.2 pg (ref 26.0–34.0)
MCHC: 33.3 g/dL (ref 30.0–36.0)
MCV: 84.8 fL (ref 78.0–100.0)
MONO ABS: 0.4 10*3/uL (ref 0.1–1.0)
Monocytes Relative: 6 %
Neutro Abs: 1.9 10*3/uL (ref 1.7–7.7)
Neutrophils Relative %: 29 %
PLATELETS: 227 10*3/uL (ref 150–400)
RBC: 4.86 MIL/uL (ref 4.22–5.81)
RDW: 13.8 % (ref 11.5–15.5)
WBC: 6.5 10*3/uL (ref 4.0–10.5)

## 2015-04-25 LAB — CBC
HCT: 40.6 % (ref 39.0–52.0)
HEMOGLOBIN: 13.1 g/dL (ref 13.0–17.0)
MCH: 27.5 pg (ref 26.0–34.0)
MCHC: 32.3 g/dL (ref 30.0–36.0)
MCV: 85.1 fL (ref 78.0–100.0)
PLATELETS: 214 10*3/uL (ref 150–400)
RBC: 4.77 MIL/uL (ref 4.22–5.81)
RDW: 13.8 % (ref 11.5–15.5)
WBC: 6.3 10*3/uL (ref 4.0–10.5)

## 2015-04-25 LAB — BASIC METABOLIC PANEL
Anion gap: 14 (ref 5–15)
BUN: 12 mg/dL (ref 6–20)
CALCIUM: 9.4 mg/dL (ref 8.9–10.3)
CO2: 23 mmol/L (ref 22–32)
Chloride: 101 mmol/L (ref 101–111)
Creatinine, Ser: 1.01 mg/dL (ref 0.61–1.24)
GFR calc Af Amer: >60 mL/min (ref >60)
GLUCOSE: 261 mg/dL — AB (ref 65–99)
Potassium: 4.4 mmol/L (ref 3.5–5.1)
Sodium: 138 mmol/L (ref 135–145)

## 2015-04-25 LAB — I-STAT CG4 LACTIC ACID, ED
Lactic Acid, Venous: 2.83 mmol/L (ref 0.5–2.0)
Lactic Acid, Venous: 3.44 mmol/L (ref 0.5–2.0)

## 2015-04-25 LAB — INFLUENZA PANEL BY PCR (TYPE A & B)
H1N1 flu by pcr: NOT DETECTED
INFLAPCR: NEGATIVE
INFLBPCR: NEGATIVE

## 2015-04-25 LAB — STREP PNEUMONIAE URINARY ANTIGEN: STREP PNEUMO URINARY ANTIGEN: NEGATIVE

## 2015-04-25 LAB — CREATININE, SERUM
CREATININE: 0.87 mg/dL (ref 0.61–1.24)
GFR calc non Af Amer: >60 mL/min (ref >60)

## 2015-04-25 MED ORDER — DEXTROSE 5 % IV SOLN
2.0000 g | Freq: Two times a day (BID) | INTRAVENOUS | Status: DC
  Administered 2015-04-25: 2 g via INTRAVENOUS
  Filled 2015-04-25 (×3): qty 2

## 2015-04-25 MED ORDER — ALBUTEROL SULFATE (2.5 MG/3ML) 0.083% IN NEBU
5.0000 mg | INHALATION_SOLUTION | Freq: Once | RESPIRATORY_TRACT | Status: AC
  Administered 2015-04-25: 5 mg via RESPIRATORY_TRACT

## 2015-04-25 MED ORDER — HYDROCHLOROTHIAZIDE 12.5 MG PO CAPS
12.5000 mg | ORAL_CAPSULE | Freq: Every day | ORAL | Status: DC
  Administered 2015-04-26 – 2015-04-27 (×2): 12.5 mg via ORAL
  Filled 2015-04-25 (×3): qty 1

## 2015-04-25 MED ORDER — IPRATROPIUM BROMIDE 0.02 % IN SOLN
0.5000 mg | Freq: Once | RESPIRATORY_TRACT | Status: AC
  Administered 2015-04-25: 0.5 mg via RESPIRATORY_TRACT
  Filled 2015-04-25: qty 2.5

## 2015-04-25 MED ORDER — GUAIFENESIN-CODEINE 100-10 MG/5ML PO SOLN
5.0000 mL | Freq: Four times a day (QID) | ORAL | Status: DC | PRN
Start: 2015-04-25 — End: 2015-04-27
  Administered 2015-04-26 – 2015-04-27 (×2): 5 mL via ORAL
  Filled 2015-04-25 (×2): qty 5

## 2015-04-25 MED ORDER — IPRATROPIUM-ALBUTEROL 0.5-2.5 (3) MG/3ML IN SOLN
RESPIRATORY_TRACT | Status: AC
  Filled 2015-04-25: qty 3

## 2015-04-25 MED ORDER — METHYLPREDNISOLONE SODIUM SUCC 125 MG IJ SOLR
60.0000 mg | Freq: Two times a day (BID) | INTRAMUSCULAR | Status: DC
  Administered 2015-04-25 – 2015-04-26 (×2): 60 mg via INTRAVENOUS
  Filled 2015-04-25 (×2): qty 2

## 2015-04-25 MED ORDER — INSULIN ASPART 100 UNIT/ML ~~LOC~~ SOLN
0.0000 [IU] | Freq: Three times a day (TID) | SUBCUTANEOUS | Status: DC
  Administered 2015-04-25: 2 [IU] via SUBCUTANEOUS

## 2015-04-25 MED ORDER — DEXTROSE 5 % IV SOLN
2.0000 g | Freq: Once | INTRAVENOUS | Status: AC
  Administered 2015-04-25: 2 g via INTRAVENOUS
  Filled 2015-04-25: qty 2

## 2015-04-25 MED ORDER — INSULIN ASPART 100 UNIT/ML ~~LOC~~ SOLN
0.0000 [IU] | Freq: Three times a day (TID) | SUBCUTANEOUS | Status: DC
  Administered 2015-04-26 (×2): 11 [IU] via SUBCUTANEOUS
  Administered 2015-04-26: 5 [IU] via SUBCUTANEOUS
  Administered 2015-04-27: 11 [IU] via SUBCUTANEOUS
  Administered 2015-04-27: 5 [IU] via SUBCUTANEOUS

## 2015-04-25 MED ORDER — INSULIN GLARGINE 100 UNIT/ML ~~LOC~~ SOLN
30.0000 [IU] | Freq: Every day | SUBCUTANEOUS | Status: DC
  Filled 2015-04-25: qty 0.3

## 2015-04-25 MED ORDER — METHYLPREDNISOLONE SODIUM SUCC 125 MG IJ SOLR
125.0000 mg | Freq: Once | INTRAMUSCULAR | Status: AC
  Administered 2015-04-25: 125 mg via INTRAVENOUS
  Filled 2015-04-25: qty 2

## 2015-04-25 MED ORDER — UMECLIDINIUM-VILANTEROL 62.5-25 MCG/INH IN AEPB: 1.0000 | INHALATION_SPRAY | Freq: Every day | RESPIRATORY_TRACT | Status: DC

## 2015-04-25 MED ORDER — ALBUTEROL SULFATE (2.5 MG/3ML) 0.083% IN NEBU
2.5000 mg | INHALATION_SOLUTION | Freq: Four times a day (QID) | RESPIRATORY_TRACT | Status: DC
  Administered 2015-04-26 – 2015-04-27 (×5): 2.5 mg via RESPIRATORY_TRACT
  Filled 2015-04-25 (×5): qty 3

## 2015-04-25 MED ORDER — ALBUTEROL SULFATE (2.5 MG/3ML) 0.083% IN NEBU: 2.5000 mg | INHALATION_SOLUTION | RESPIRATORY_TRACT | Status: DC

## 2015-04-25 MED ORDER — LOSARTAN POTASSIUM-HCTZ 100-12.5 MG PO TABS: 1.0000 | ORAL_TABLET | Freq: Every day | ORAL | Status: DC

## 2015-04-25 MED ORDER — TIOTROPIUM BROMIDE MONOHYDRATE 18 MCG IN CAPS
18.0000 ug | ORAL_CAPSULE | Freq: Every day | RESPIRATORY_TRACT | Status: DC
  Administered 2015-04-26 – 2015-04-27 (×2): 18 ug via RESPIRATORY_TRACT
  Filled 2015-04-25 (×2): qty 5

## 2015-04-25 MED ORDER — ASPIRIN EC 81 MG PO TBEC
81.0000 mg | DELAYED_RELEASE_TABLET | Freq: Every day | ORAL | Status: DC
  Administered 2015-04-26 – 2015-04-27 (×2): 81 mg via ORAL
  Filled 2015-04-25 (×2): qty 1

## 2015-04-25 MED ORDER — SODIUM CHLORIDE 0.9 % IV SOLN
INTRAVENOUS | Status: AC
  Administered 2015-04-25: 19:00:00 via INTRAVENOUS

## 2015-04-25 MED ORDER — ALBUTEROL SULFATE (2.5 MG/3ML) 0.083% IN NEBU
5.0000 mg | INHALATION_SOLUTION | Freq: Once | RESPIRATORY_TRACT | Status: AC
  Administered 2015-04-25: 5 mg via RESPIRATORY_TRACT
  Filled 2015-04-25: qty 6

## 2015-04-25 MED ORDER — TRAZODONE HCL 50 MG PO TABS: 50.0000 mg | ORAL_TABLET | Freq: Every evening | ORAL | Status: DC | PRN

## 2015-04-25 MED ORDER — ENOXAPARIN SODIUM 40 MG/0.4ML ~~LOC~~ SOLN
40.0000 mg | SUBCUTANEOUS | Status: DC
  Administered 2015-04-25 – 2015-04-26 (×2): 40 mg via SUBCUTANEOUS
  Filled 2015-04-25 (×2): qty 0.4

## 2015-04-25 MED ORDER — VANCOMYCIN HCL 10 G IV SOLR
1500.0000 mg | Freq: Once | INTRAVENOUS | Status: AC
  Administered 2015-04-25: 1500 mg via INTRAVENOUS
  Filled 2015-04-25: qty 1500

## 2015-04-25 MED ORDER — PANTOPRAZOLE SODIUM 40 MG PO TBEC
40.0000 mg | DELAYED_RELEASE_TABLET | Freq: Every day | ORAL | Status: DC
  Administered 2015-04-26 – 2015-04-27 (×2): 40 mg via ORAL
  Filled 2015-04-25 (×2): qty 1

## 2015-04-25 MED ORDER — VANCOMYCIN HCL IN DEXTROSE 1-5 GM/200ML-% IV SOLN
1000.0000 mg | Freq: Two times a day (BID) | INTRAVENOUS | Status: DC
  Administered 2015-04-26: 1000 mg via INTRAVENOUS
  Filled 2015-04-25 (×2): qty 200

## 2015-04-25 MED ORDER — LOSARTAN POTASSIUM 50 MG PO TABS
100.0000 mg | ORAL_TABLET | Freq: Every day | ORAL | Status: DC
Start: 2015-04-26 — End: 2015-04-27
  Administered 2015-04-26 – 2015-04-27 (×2): 100 mg via ORAL
  Filled 2015-04-25 (×2): qty 2

## 2015-04-25 MED ORDER — ALBUTEROL SULFATE (2.5 MG/3ML) 0.083% IN NEBU
2.5000 mg | INHALATION_SOLUTION | RESPIRATORY_TRACT | Status: DC | PRN
  Administered 2015-04-25: 2.5 mg via RESPIRATORY_TRACT
  Filled 2015-04-25: qty 3

## 2015-04-25 MED ORDER — INSULIN ASPART 100 UNIT/ML ~~LOC~~ SOLN
4.0000 [IU] | Freq: Three times a day (TID) | SUBCUTANEOUS | Status: DC
  Administered 2015-04-25 – 2015-04-27 (×6): 4 [IU] via SUBCUTANEOUS

## 2015-04-25 NOTE — Progress Notes (Signed)
Pharmacy Antibiotic Note  BARTLETT MUHLENKAMP is a 65 y.o. male admitted on 04/25/2015 with pneumonia.  Pharmacy has been consulted for vancomycin dosing. Pt is afebrile and WBC is WNL. Scr is WNL. Lactic acid is elevated at 2.83 and chest x-ray is suspicious for pneumonia.   Plan: - Vancomycin 1500mg  IV x 1 then 1gm IV Q12H - F/u renal fxn, C&S, clinical status and trough at SS  Weight: 179 lb (81.194 kg)  Temp (24hrs), Avg:98.1 F (36.7 C), Min:98.1 F (36.7 C), Max:98.1 F (36.7 C)   Recent Labs Lab 04/25/15 1308 04/25/15 1315  WBC 6.5  --   CREATININE 1.01  --   LATICACIDVEN  --  2.83*    Estimated Creatinine Clearance: 75 mL/min (by C-G formula based on Cr of 1.01).    Allergies  Allergen Reactions  . Crestor [Rosuvastatin] Other (See Comments)    intolerance  . Mobic [Meloxicam] Rash    Antimicrobials this admission: Vanc 2/20>> Cefepime x 1 2/20  Dose adjustments this admission: N/A  Microbiology results: Pending  Thank you for allowing pharmacy to be a part of this patient's care.  Ainsleigh Kakos, Rande Lawman 04/25/2015 2:42 PM

## 2015-04-25 NOTE — ED Notes (Signed)
Patient here with shortness of breath and cough x 1 week. Using inhaler with minimal relief, speaking full sentences

## 2015-04-25 NOTE — ED Provider Notes (Signed)
Medical screening examination/treatment/procedure(s) were conducted as a shared visit with non-physician practitioner(s) and myself.  I personally evaluated the patient during the encounter.   EKG Interpretation   Date/Time:  Monday April 25 2015 13:05:44 EST Ventricular Rate:  118 PR Interval:  140 QRS Duration: 80 QT Interval:  318 QTC Calculation: 445 R Axis:   70 Text Interpretation:  Sinus tachycardia Otherwise normal ECG Confirmed by  Naarah Borgerding  MD, Kenadie Royce ([id number]) on 04/25/2015 2:11:13 PM     Patient here with cough and congestion 1 week which is progressively worse. Chest x-ray consistent with pneumonia. Patient started on antibiotics and will be admitted to the medicine service  Lacretia Leigh, MD 04/25/15 [phone]

## 2015-04-25 NOTE — ED Provider Notes (Signed)
CSN: [id number]     Arrival date & time 04/25/15  1247 History   First MD Initiated Contact with Patient 04/25/15 1410     Chief Complaint  Patient presents with  . Shortness of Breath  . Cough     (Consider location/radiation/quality/duration/timing/severity/associated sxs/prior Treatment) HPI Comments: Patient with a history of HTN, CAD, COPD presents today with a productive cough x 5 days and SOB.  He states that his symptoms are gradually worsening.  He has been using his Albuterol inhaler, but does not feel that his SOB is improving.  He was recently hospitalized last month for 2 days for CAP and COPD exacerbation.  He was given Rocephin and Azithromycin while in the ED and discharged home on oral Azithromycin and also oral Prednisone.  He states that his symptoms did improve after hospitalization, but then again returned 5 days ago.  He denies fever, chills, nausea, vomiting, chest pain, or increased LE edema.  The history is provided by the patient.    Past Medical History  Diagnosis Date  . Hypertension   . Coronary artery disease   . High cholesterol   . COPD (chronic obstructive pulmonary disease) ([place])   . Shortness of breath dyspnea   . Asthma   . Pneumonia 03/2015  . Type II diabetes mellitus ([place])   . Hepatitis C   . Arthritis     "knees, hands" (03/08/2015)  . Chronic diastolic CHF (congestive heart failure) ([place])     notes 03/08/2015  . Chronic bronchitis [facility])    Past Surgical History  Procedure Laterality Date  . Esophagogastroduodenoscopy  02/09/2011    Procedure: ESOPHAGOGASTRODUODENOSCOPY (EGD);  Surgeon: Missy Sabins, MD;  Location: [facility] ENDOSCOPY;  Service: Endoscopy;  Laterality: N/A;  . Joint replacement    . Total knee arthroplasty Left 10/22/2009  . Cataract extraction Left 02/2014   Family History  Problem Relation Age of Onset  . Stroke Mother   . Diabetes Mother   . Hypertension Mother   . Hyperlipidemia Mother   . Heart disease Father   .  Hypertension Father   . Heart attack Father    Social History  Substance Use Topics  . Smoking status: Former Smoker -- 1.50 packs/day for 46 years    Types: Cigarettes    Quit date: 03/19/2013  . Smokeless tobacco: Never Used  . Alcohol Use: 0.0 oz/week    0 Standard drinks or equivalent per week     Comment: "recovering alcoholic; 123XX123"    Review of Systems  All other systems reviewed and are negative.     Allergies  Crestor and Mobic  Home Medications   Prior to Admission medications   Medication Sig Start Date End Date Taking? Authorizing Provider  albuterol (PROVENTIL HFA;VENTOLIN HFA) 108 (90 BASE) MCG/ACT inhaler Inhale 2 puffs into the lungs every 6 (six) hours as needed for wheezing or shortness of breath. 12/15/14   Hoyt Koch, MD  albuterol (PROVENTIL) (2.5 MG/3ML) 0.083% nebulizer solution Take 3 mLs (2.5 mg total) by nebulization every 4 (four) hours as needed for wheezing or shortness of breath. 12/15/14   Hoyt Koch, MD  aspirin EC 81 MG tablet Take 1 tablet (81 mg total) by mouth daily. 08/31/13   Lorayne Marek, MD  cholecalciferol (VITAMIN D) 1000 UNITS tablet Take 1 tablet (1,000 Units total) by mouth daily. 08/20/12   Robbie Lis, MD  Cinnamon 500 MG capsule Take 500 mg by mouth 2 (two) times daily.  Historical Provider, MD  glucose blood test strip Use as instructed to check blood sugar four times a day. [id number] 12/15/14   Hoyt Koch, MD  guaiFENesin-codeine 100-10 MG/5ML syrup Take 5 mLs by mouth every 6 (six) hours as needed for cough. 03/10/15   Ripudeep Krystal Eaton, MD  hydrOXYzine (ATARAX/VISTARIL) 25 MG tablet Take 1 tablet (25 mg total) by mouth every 6 (six) hours. 09/27/14   Hoyt Koch, MD  Insulin Glargine (LANTUS SOLOSTAR) 100 UNIT/ML Solostar Pen Inject 30 Units into the skin every morning. And pen needles 1/day 03/25/15   Renato Shin, MD  Insulin Pen Needle (NOVOFINE) 32G X 6 MM MISC Use to inject insulin 1  time per day. 04/07/15   Renato Shin, MD  Lancets MISC Use to test sugar four times daily. DX E11.09 12/15/14   Hoyt Koch, MD  losartan-hydrochlorothiazide (HYZAAR) 100-12.5 MG per tablet Take 1 tablet by mouth daily. 09/27/14   Hoyt Koch, MD  metFORMIN (GLUCOPHAGE) 1000 MG tablet TAKE 1 TABLET TWICE DAILY WITH MEALS 12/15/14   Hoyt Koch, MD  Omega-3 Fatty Acids (FISH OIL) 1000 MG CAPS Take 2 capsules by mouth daily.    Historical Provider, MD  rosuvastatin (CRESTOR) 20 MG tablet Take 1 tablet (20 mg total) by mouth daily. 09/27/14   Hoyt Koch, MD  SPIRIVA HANDIHALER 18 MCG inhalation capsule Take 1 puff by mouth daily. Reported on 03/16/2015 12/05/14   Historical Provider, MD  traZODone (DESYREL) 50 MG tablet TAKE ONE TABLET BY MOUTH AT BEDTIME AS NEEDED FOR SLEEP 04/21/15   Hoyt Koch, MD  Umeclidinium-Vilanterol 62.5-25 MCG/INH AEPB Inhale 1 puff into the lungs daily. 12/15/14   Hoyt Koch, MD   BP 140/97 mmHg  Pulse 116  Temp(Src) 98.1 F (36.7 C) (Oral)  Resp 22  Wt 81.194 kg  SpO2 96% Physical Exam  Constitutional: He appears well-developed and well-nourished.  HENT:  Head: Normocephalic and atraumatic.  Mouth/Throat: Oropharynx is clear and moist.  Neck: Normal range of motion. Neck supple.  Cardiovascular: Normal rate, regular rhythm and normal heart sounds.   Pulmonary/Chest: Effort normal. No respiratory distress. He has wheezes. He has no rales. He exhibits no tenderness.  Diffuse expiratory wheezing Speaking in complete sentences  Musculoskeletal: Normal range of motion.  Neurological: He is alert.  Skin: Skin is warm and dry.  Psychiatric: He has a normal mood and affect.  Nursing note and vitals reviewed.   ED Course  Procedures (including critical care time) Labs Review Labs Reviewed  CBC WITH DIFFERENTIAL/PLATELET - Abnormal; Notable for the following:    Eosinophils Absolute 1.3 (*)    All other  components within normal limits  BASIC METABOLIC PANEL - Abnormal; Notable for the following:    Glucose, Bld 261 (*)    All other components within normal limits  I-STAT CG4 LACTIC ACID, ED - Abnormal; Notable for the following:    Lactic Acid, Venous 2.83 (*)    All other components within normal limits  CULTURE, BLOOD (ROUTINE X 2)  CULTURE, BLOOD (ROUTINE X 2)  URINALYSIS, ROUTINE W REFLEX MICROSCOPIC (NOT AT [facility])  I-STAT TROPOININ, ED  I-STAT CG4 LACTIC ACID, ED    Imaging Review Dg Chest 2 View  04/25/2015  CLINICAL DATA:  Cough and shortness of breath.  COPD.  Asthma. EXAM: CHEST  2 VIEW COMPARISON:  03/08/2015. FINDINGS: Normal sized heart. Interval minimal patchy density at the left lung base. Mild diffuse peribronchial thickening. Minimal thoracic  spine degenerative changes. IMPRESSION: 1. Interval minimal patchy density at the left lung base, suspicious for pneumonia. 2. Mild bronchitic changes. Electronically Signed   By: Claudie Revering M.D.   On: 04/25/2015 14:05   I have personally reviewed and evaluated these images and lab results as part of my medical decision-making.   EKG Interpretation   Date/Time:  Monday April 25 2015 13:05:44 EST Ventricular Rate:  118 PR Interval:  140 QRS Duration: 80 QT Interval:  318 QTC Calculation: 445 R Axis:   70 Text Interpretation:  Sinus tachycardia Otherwise normal ECG Confirmed by  ALLEN  MD, ANTHONY ([id number]) on 04/25/2015 2:11:13 PM      MDM   Final diagnoses:  None   Patient presents today with a productive cough x 5 days and increasing SOB.  He was recently hospitalized in January 2017 for COPD exacerbation and possible CAP at that time.  Today labs are unremarkable aside from elevated lactate and elevated blood sugar.  NO signs of DKA.  On exam, patient with diffuse wheezing.  Patient given IV Solumedrol and Duoneb in the ED.  CXR today showing patchy density suspicious for Pneumonia.  Due to recent hospitalization,  patient treated with IV antibiotics to treat for HCAP.  Patient admitted to [place], PA-C 04/25/15 2138

## 2015-04-25 NOTE — H&P (Signed)
[facility] History and Physical  [name] [id number] DOB: 17-Jan-1951 DOA: 04/25/2015  65 y/o ? Prior Intermittent asthma+ Vocal chord dysfunction-12/08/2013 Simple spirometry> Ratio 755, FEV1 2.09L (80% pred), FEF 25-75 52% pred, flow volume loop suggestive of small airways disease Htn  Chr Diastolic CHF IDDM ty II diag 1995 + PAD-never DKA Htn GERD Hep C Prior Smoking h/o 45 pck yrs-quit 2015  Recent admission 1/3-1/5 2017 with community-acquired pneumonia treated at that time with IV antibiotics and transitioned to azithromycin as well as Ceftin 500 twice a day and completed the course as well as steroid taper.  Was doing fair until about 04/19/2015-had gone to visit family members who were sick in the hospital in a [place] so may been exposed to ill contacts at the time. No other ill contacts. Has had flu shot. Has had pneumonia vaccine. No other symptoms outside of cough and sputum. No diarrhea no chills no nausea no vomiting no dysuria no other chest pain other than after coughing No blurred vision, no double vision, no rash, no unilateral weakness, no seizure, no altered mental status  Has been using his inhaler more often than previously and states that he doesn't want to follow-up with pulmonology because he has "too many doctors" Also expresses some confusion that he was told he has asthma and has been told he has COPD as well and is not sure which is which   Past Medical History  Diagnosis Date  . Hypertension   . Coronary artery disease   . High cholesterol   . COPD (chronic obstructive pulmonary disease) ([place])   . Shortness of breath dyspnea   . Asthma   . Pneumonia 03/2015  . Type II diabetes mellitus ([place])   . Hepatitis C   . Arthritis     "knees, hands" (03/08/2015)  . Chronic diastolic CHF (congestive heart failure) ([name])     notes 03/08/2015  . Chronic bronchitis [facility])    Past Surgical History  Procedure Laterality Date  .  Esophagogastroduodenoscopy  02/09/2011    Procedure: ESOPHAGOGASTRODUODENOSCOPY (EGD);  Surgeon: Missy Sabins, MD;  Location: [facility] ENDOSCOPY;  Service: Endoscopy;  Laterality: N/A;  . Joint replacement    . Total knee arthroplasty Left 10/22/2009  . Cataract extraction Left 02/2014   Social History:  Social History   Social History Narrative    Allergies  Allergen Reactions  . Crestor [Rosuvastatin] Other (See Comments)    intolerance  . Mobic [Meloxicam] Rash    Family History  Problem Relation Age of Onset  . Stroke Mother   . Diabetes Mother   . Hypertension Mother   . Hyperlipidemia Mother   . Heart disease Father   . Hypertension Father   . Heart attack Father     Prior to Admission medications   Medication Sig Start Date End Date Taking? Authorizing Provider  albuterol (PROVENTIL HFA;VENTOLIN HFA) 108 (90 BASE) MCG/ACT inhaler Inhale 2 puffs into the lungs every 6 (six) hours as needed for wheezing or shortness of breath. 12/15/14  Yes Hoyt Koch, MD  aspirin EC 81 MG tablet Take 1 tablet (81 mg total) by mouth daily. 08/31/13  Yes Lorayne Marek, MD  cholecalciferol (VITAMIN D) 1000 UNITS tablet Take 1 tablet (1,000 Units total) by mouth daily. 08/20/12  Yes Robbie Lis, MD  Cinnamon 500 MG capsule Take 500 mg by mouth 2 (two) times daily.   Yes Historical Provider, MD  glucose blood test strip Use as instructed  to check blood sugar four times a day. DXE11.09 12/15/14  Yes Hoyt Koch, MD  guaiFENesin-codeine 100-10 MG/5ML syrup Take 5 mLs by mouth every 6 (six) hours as needed for cough. 03/10/15  Yes Ripudeep Krystal Eaton, MD  hydrOXYzine (ATARAX/VISTARIL) 25 MG tablet Take 1 tablet (25 mg total) by mouth every 6 (six) hours. 09/27/14  Yes Hoyt Koch, MD  Insulin Glargine (LANTUS SOLOSTAR) 100 UNIT/ML Solostar Pen Inject 30 Units into the skin every morning. And pen needles 1/day 03/25/15  Yes Renato Shin, MD  Insulin Pen Needle (NOVOFINE) 32G X 6 MM  MISC Use to inject insulin 1 time per day. 04/07/15  Yes Renato Shin, MD  Lancets MISC Use to test sugar four times daily. DX E11.09 12/15/14  Yes Hoyt Koch, MD  losartan-hydrochlorothiazide (HYZAAR) 100-12.5 MG per tablet Take 1 tablet by mouth daily. 09/27/14  Yes Hoyt Koch, MD  metFORMIN (GLUCOPHAGE) 1000 MG tablet TAKE 1 TABLET TWICE DAILY WITH MEALS 12/15/14  Yes Hoyt Koch, MD  Omega-3 Fatty Acids (FISH OIL) 1000 MG CAPS Take 2 capsules by mouth daily.   Yes Historical Provider, MD  omeprazole (PRILOSEC) 40 MG capsule Take 40 mg by mouth daily. 04/21/15  Yes Historical Provider, MD  rosuvastatin (CRESTOR) 20 MG tablet Take 1 tablet (20 mg total) by mouth daily. 09/27/14  Yes Hoyt Koch, MD  SPIRIVA HANDIHALER 18 MCG inhalation capsule Take 1 puff by mouth daily. Reported on 03/16/2015 12/05/14  Yes Historical Provider, MD  traZODone (DESYREL) 50 MG tablet TAKE ONE TABLET BY MOUTH AT BEDTIME AS NEEDED FOR SLEEP 04/21/15  Yes Hoyt Koch, MD  albuterol (PROVENTIL) (2.5 MG/3ML) 0.083% nebulizer solution Take 3 mLs (2.5 mg total) by nebulization every 4 (four) hours as needed for wheezing or shortness of breath. Patient not taking: Reported on 04/25/2015 12/15/14   Hoyt Koch, MD  Umeclidinium-Vilanterol 62.5-25 MCG/INH AEPB Inhale 1 puff into the lungs daily. Patient not taking: Reported on 04/25/2015 12/15/14   Hoyt Koch, MD   Physical Exam: Filed Vitals:   04/25/15 1308  BP: 140/97  Pulse: 116  Temp: 98.1 F (36.7 C)  TempSrc: Oral  Resp: 22  Weight: 81.194 kg (179 lb)  SpO2: 96%   Alert oriented no apparent distress EOMI NCAT tracks well with eyes Mallampati 2 Moderate dentition No JVD, no bruit S1-S2 slightly tachycardic Abdomen soft nontender nondistended no rebound Chest clinically has wheeze bilaterally posteriorly no rales no rhonchi No lower extremity edema    Labs on Admission:  Basic Metabolic  Panel:  Recent Labs Lab 04/25/15 1308  NA 138  K 4.4  CL 101  CO2 23  GLUCOSE 261*  BUN 12  CREATININE 1.01  CALCIUM 9.4   Liver Function Tests: No results for input(s): AST, ALT, ALKPHOS, BILITOT, PROT, ALBUMIN in the last 168 hours. No results for input(s): LIPASE, AMYLASE in the last 168 hours. No results for input(s): AMMONIA in the last 168 hours. CBC:  Recent Labs Lab 04/25/15 1308  WBC 6.5  NEUTROABS 1.9  HGB 13.7  HCT 41.2  MCV 84.8  PLT 227   Cardiac Enzymes: No results for input(s): CKTOTAL, CKMB, CKMBINDEX, TROPONINI in the last 168 hours.  BNP (last 3 results)  Recent Labs  02/09/15 1305 02/17/15 1330 03/08/15 1905  BNP 61.7 21.5 18.1    ProBNP (last 3 results) No results for input(s): PROBNP in the last 8760 hours.  CBG: No results for input(s): GLUCAP in  the last 168 hours.  Radiological Exams on Admission: Dg Chest 2 View  04/25/2015  CLINICAL DATA:  Cough and shortness of breath.  COPD.  Asthma. EXAM: CHEST  2 VIEW COMPARISON:  03/08/2015. FINDINGS: Normal sized heart. Interval minimal patchy density at the left lung base. Mild diffuse peribronchial thickening. Minimal thoracic spine degenerative changes. IMPRESSION: 1. Interval minimal patchy density at the left lung base, suspicious for pneumonia. 2. Mild bronchitic changes. Electronically Signed   By: Claudie Revering M.D.   On: 04/25/2015 14:05    EKG: Independently reviewed. Sinus, sinus tach  Assessment/Plan Active Problems:   HCAP (healthcare-associated pneumonia)   PNA (pneumonia)    Healthcare associated pneumonia-admitted about 1-1/2 months ago. Would continue vancomycin as well as cefepime. Given recurrence would check flu although has been immunized. Also check Legionella, Haemophilus and sputum culture. I do not think he has sepsis. Blood pressures are normal and he has no high-grade fever or white count and his lactic acidosis is secondary to metformin use Type B metabolic  acidosis-secondary to metformin Acute exacerbation of Bronchitic asthma+ COPD-patient had PFTs done by simple spirometry and will need normal PFTs done to determine the actual cause. Regarding treatment however this does not change that he will need his Anor, albuterol nebulizations every 4 when necessary, Spiriva 18 g daily We will give him a steroid burst also of Solu-Medrol 60 mg IV E ID and probably transition to by mouth in the morning Ty II  DM diagnosed in 1995-hold metformin for now given lactic acidosis. Continue Lantus 30 units daily and placed on moderate sliding scale given on steroids Hypertension GERD-continue pantoprazole 40 daily Sleep-continue trazodone 50 daily ASCVD-continue aspirin 81 daily--would hold off on omega-3 fatty acids, Crestor, hydroxyzine, cholecalciferol for right now   Full CODE STATUS 60 minutes Admit to telemetry

## 2015-04-26 LAB — COMPREHENSIVE METABOLIC PANEL
ALT: 80 U/L — ABNORMAL HIGH (ref 17–63)
ANION GAP: 12 (ref 5–15)
AST: 49 U/L — ABNORMAL HIGH (ref 15–41)
Albumin: 3.6 g/dL (ref 3.5–5.0)
Alkaline Phosphatase: 45 U/L (ref 38–126)
BILIRUBIN TOTAL: 0.5 mg/dL (ref 0.3–1.2)
BUN: 19 mg/dL (ref 6–20)
CO2: 24 mmol/L (ref 22–32)
Calcium: 9 mg/dL (ref 8.9–10.3)
Chloride: 98 mmol/L — ABNORMAL LOW (ref 101–111)
Creatinine, Ser: 1.06 mg/dL (ref 0.61–1.24)
Glucose, Bld: 347 mg/dL — ABNORMAL HIGH (ref 65–99)
POTASSIUM: 4.5 mmol/L (ref 3.5–5.1)
Sodium: 134 mmol/L — ABNORMAL LOW (ref 135–145)
TOTAL PROTEIN: 6.6 g/dL (ref 6.5–8.1)

## 2015-04-26 LAB — BRAIN NATRIURETIC PEPTIDE: B NATRIURETIC PEPTIDE 5: 56.5 pg/mL (ref 0.0–100.0)

## 2015-04-26 LAB — CBC WITH DIFFERENTIAL/PLATELET
BASOS ABS: 0 10*3/uL (ref 0.0–0.1)
Basophils Relative: 0 %
EOS PCT: 0 %
Eosinophils Absolute: 0 10*3/uL (ref 0.0–0.7)
HCT: 39 % (ref 39.0–52.0)
Hemoglobin: 12.6 g/dL — ABNORMAL LOW (ref 13.0–17.0)
LYMPHS PCT: 28 %
Lymphs Abs: 1.6 10*3/uL (ref 0.7–4.0)
MCH: 27.4 pg (ref 26.0–34.0)
MCHC: 32.3 g/dL (ref 30.0–36.0)
MCV: 84.8 fL (ref 78.0–100.0)
Monocytes Absolute: 0.1 10*3/uL (ref 0.1–1.0)
Monocytes Relative: 1 %
NEUTROS ABS: 3.9 10*3/uL (ref 1.7–7.7)
Neutrophils Relative %: 71 %
PLATELETS: 228 10*3/uL (ref 150–400)
RBC: 4.6 MIL/uL (ref 4.22–5.81)
RDW: 13.8 % (ref 11.5–15.5)
WBC: 5.6 10*3/uL (ref 4.0–10.5)

## 2015-04-26 LAB — GLUCOSE, CAPILLARY
GLUCOSE-CAPILLARY: 303 mg/dL — AB (ref 65–99)
GLUCOSE-CAPILLARY: 325 mg/dL — AB (ref 65–99)
Glucose-Capillary: 324 mg/dL — ABNORMAL HIGH (ref 65–99)
Glucose-Capillary: 242 mg/dL — ABNORMAL HIGH (ref 65–99)

## 2015-04-26 LAB — HIV ANTIBODY (ROUTINE TESTING W REFLEX): HIV Screen 4th Generation wRfx: NONREACTIVE

## 2015-04-26 LAB — LEGIONELLA ANTIGEN, URINE

## 2015-04-26 LAB — LACTIC ACID, PLASMA
Lactic Acid, Venous: 2.6 mmol/L (ref 0.5–2.0)
Lactic Acid, Venous: 2 mmol/L (ref 0.5–2.0)

## 2015-04-26 MED ORDER — LEVOFLOXACIN 500 MG PO TABS
500.0000 mg | ORAL_TABLET | Freq: Every day | ORAL | Status: DC
  Administered 2015-04-26 – 2015-04-27 (×2): 500 mg via ORAL
  Filled 2015-04-26 (×2): qty 1

## 2015-04-26 MED ORDER — INSULIN GLARGINE 100 UNIT/ML ~~LOC~~ SOLN
40.0000 [IU] | Freq: Every day | SUBCUTANEOUS | Status: DC
  Administered 2015-04-26 – 2015-04-27 (×2): 40 [IU] via SUBCUTANEOUS
  Filled 2015-04-26 (×2): qty 0.4

## 2015-04-26 MED ORDER — METFORMIN HCL 500 MG PO TABS: 500.0000 mg | ORAL_TABLET | Freq: Every day | ORAL | Status: DC

## 2015-04-26 MED ORDER — PREDNISONE 50 MG PO TABS
60.0000 mg | ORAL_TABLET | Freq: Every day | ORAL | Status: DC
  Administered 2015-04-26 – 2015-04-27 (×2): 60 mg via ORAL
  Filled 2015-04-26 (×2): qty 1

## 2015-04-26 NOTE — Progress Notes (Addendum)
[name] [id number] DOB: 13-Jan-1951 DOA: 04/25/2015 PCP: Hoyt Koch, MD  Brief narrative: 65 y/o ? Prior Intermittent asthma+ Vocal chord dysfunction-12/08/2013 Simple spirometry> Ratio 755, FEV1 2.09L (80% pred), FEF 25-75 52% pred, flow volume loop suggestive of small airways disease Htn  Chr Diastolic CHF IDDM ty II diag 1995 + PAD-never DKA Htn GERD Hep C Prior Smoking h/o 45 pck yrs-quit 2015  Note patient recently admitted 1/3-1/517 with community-acquired pneumonia and appropriately treated with taper of antibiotics and steroids on discharge home  patient was re-dmitted to the hospital 04/25/15 with 20 quite pneumonia/healthcare associated pneumonia   Past medical history-As per Problem list Chart reviewed as below-   Consultants:    Procedures:    Antibiotics:     Subjective  Alert oriented pleasant in good spirits Tolerated a full meal Tells me since quitting smoking and or recently over the past month walking back and forth to the grocery store has been causing him shortness of breath. Sometimes has to stop in between No lower extremity swelling occasional platypnea No other findings Today feels well-still wheezing slightly No fever no chills   Objective    Interim History:   Telemetry: Sinus rhythm   Objective: Filed Vitals:   04/25/15 2001 04/25/15 2058 04/26/15 0500 04/26/15 0759  BP: 142/70  135/84   Pulse: 102  98   Temp: 98.6 F (37 C)  97.3 F (36.3 C)   TempSrc: Oral  Oral   Resp: 22     Weight:   81.375 kg (179 lb 6.4 oz)   SpO2: 95% 98% 96% 95%    Intake/Output Summary (Last 24 hours) at 04/26/15 0917 Last data filed at 04/26/15 [id number]  Gross per 24 hour  Intake   1830 ml  Output   1851 ml  Net    -21 ml    Exam:    Data Reviewed: Basic Metabolic Panel:  Recent Labs Lab 04/25/15 1308 04/25/15 1832 04/26/15 0551  NA 138  --  134*  K 4.4  --  4.5  CL 101  --  98*  CO2 23  --  24  GLUCOSE  261*  --  347*  BUN 12  --  19  CREATININE 1.01 0.87 1.06  CALCIUM 9.4  --  9.0   Liver Function Tests:  Recent Labs Lab 04/26/15 0551  AST 49*  ALT 80*  ALKPHOS 45  BILITOT 0.5  PROT 6.6  ALBUMIN 3.6   No results for input(s): LIPASE, AMYLASE in the last 168 hours. No results for input(s): AMMONIA in the last 168 hours. CBC:  Recent Labs Lab 04/25/15 1308 04/25/15 1832 04/26/15 0551  WBC 6.5 6.3 5.6  NEUTROABS 1.9  --  3.9  HGB 13.7 13.1 12.6*  HCT 41.2 40.6 39.0  MCV 84.8 85.1 84.8  PLT 227 214 228   Cardiac Enzymes: No results for input(s): CKTOTAL, CKMB, CKMBINDEX, TROPONINI in the last 168 hours. BNP: Invalid input(s): POCBNP CBG:  Recent Labs Lab 04/25/15 1836 04/25/15 2113 04/26/15 0534  GLUCAP 146* 249* 324*    No results found for this or any previous visit (from the past 240 hour(s)).   Studies:              All Imaging reviewed and is as per above notation   Scheduled Meds: . albuterol  2.5 mg Nebulization QID  . aspirin EC  81 mg Oral Daily  . ceFEPime (MAXIPIME) IV  2 g Intravenous Q12H  .  enoxaparin (LOVENOX) injection  40 mg Subcutaneous Q24H  . losartan  100 mg Oral Daily   And  . hydrochlorothiazide  12.5 mg Oral Daily  . insulin aspart  0-15 Units Subcutaneous TID WC  . insulin aspart  4 Units Subcutaneous TID WC  . insulin glargine  30 Units Subcutaneous Daily  . methylPREDNISolone (SOLU-MEDROL) injection  60 mg Intravenous Q12H  . pantoprazole  40 mg Oral Daily  . tiotropium  18 mcg Inhalation Daily  . vancomycin  1,000 mg Intravenous Q12H   Continuous Infusions:    Assessment/Plan:   Healthcare associated pneumonia-admitted about 1-1/2 months ago. Transitioned from on 2/21 vancomycin as well as cefepime-->Levaquin by mouth 500 daily. Given recurrence would check flu although has been immunized. Also check Legionella, Haemophilus and sputum culture. I do not think he has sepsis.  On exam today 2/21 noted JVD therefore  obtain BNP to rule out heart failure. Do not think this is the case however-would hold echocardiogram until BNP results come back  Type B metabolic acidosis-secondary to metformin-He has inquired about using this medication in the hospital and I have told him I'll put him on a lower dose of this secondary to the metabolic acidosis. Obtained lactic acid a.m.  Acute exacerbation of Bronchitic asthma+ COPD-patient had PFTs done by simple spirometry and will need normal PFTs done to determine the actual cause. Regarding treatment however this does not change that he will need his Anor, albuterol nebulizations every 4 when necessary, Spiriva 18 g daily We will give him a steroid burst also of Solu-Medrol 60 mg IV ---->transitioned to by mouth prednisone 2/21   Ty II DM diagnosed in 1995-  Continue moderate sliding scale given on steroids See above discussion regarding metformin Blood sugars ranging between 249-324 Increased Lantus 30 units to 40 units  Hypertension-continue Hyzaar one tablet daily  GERD-continue pantoprazole 40 daily  Sleep-continue trazodone 50 daily  ASCVD-continue aspirin 81 daily--would hold off on omega-3 fatty acids, Crestor, hydroxyzine, cholecalciferol for right now  Await proBNP-would not get echo if this is below 200 Possible discharge in a.m. if all stable Needs to get back in to see pulmonology for formal PFTs-have counseled him on admission that does need to have this done--no concerns per Dr. [place] Bells that this is probably vocal cord dysfunction and he may need habituation and input from both pulmonology and ENT as an outpatient   Verneita Griffes, MD  [facility] Pager [phone] 04/26/2015, 9:17 AM    LOS: 1 day

## 2015-04-27 DIAGNOSIS — I5032 Chronic diastolic (congestive) heart failure: Secondary | ICD-10-CM

## 2015-04-27 DIAGNOSIS — I10 Essential (primary) hypertension: Secondary | ICD-10-CM

## 2015-04-27 DIAGNOSIS — J441 Chronic obstructive pulmonary disease with (acute) exacerbation: Secondary | ICD-10-CM

## 2015-04-27 DIAGNOSIS — E118 Type 2 diabetes mellitus with unspecified complications: Secondary | ICD-10-CM

## 2015-04-27 DIAGNOSIS — G4700 Insomnia, unspecified: Secondary | ICD-10-CM

## 2015-04-27 DIAGNOSIS — K219 Gastro-esophageal reflux disease without esophagitis: Secondary | ICD-10-CM

## 2015-04-27 LAB — BASIC METABOLIC PANEL
Anion gap: 8 (ref 5–15)
BUN: 21 mg/dL — AB (ref 6–20)
CALCIUM: 9.2 mg/dL (ref 8.9–10.3)
CHLORIDE: 101 mmol/L (ref 101–111)
CO2: 29 mmol/L (ref 22–32)
CREATININE: 0.89 mg/dL (ref 0.61–1.24)
GFR calc Af Amer: >60 mL/min (ref >60)
GFR calc non Af Amer: >60 mL/min (ref >60)
GLUCOSE: 255 mg/dL — AB (ref 65–99)
Potassium: 4.2 mmol/L (ref 3.5–5.1)
Sodium: 138 mmol/L (ref 135–145)

## 2015-04-27 LAB — CBC
HEMATOCRIT: 37.3 % — AB (ref 39.0–52.0)
HEMOGLOBIN: 12.2 g/dL — AB (ref 13.0–17.0)
MCH: 27.9 pg (ref 26.0–34.0)
MCHC: 32.7 g/dL (ref 30.0–36.0)
MCV: 85.2 fL (ref 78.0–100.0)
Platelets: 209 10*3/uL (ref 150–400)
RBC: 4.38 MIL/uL (ref 4.22–5.81)
RDW: 13.9 % (ref 11.5–15.5)
WBC: 8.5 10*3/uL (ref 4.0–10.5)

## 2015-04-27 LAB — GLUCOSE, CAPILLARY
GLUCOSE-CAPILLARY: 301 mg/dL — AB (ref 65–99)
Glucose-Capillary: 237 mg/dL — ABNORMAL HIGH (ref 65–99)

## 2015-04-27 MED ORDER — LEVOFLOXACIN 500 MG PO TABS: 500.0000 mg | ORAL_TABLET | Freq: Every day | ORAL | Status: DC

## 2015-04-27 MED ORDER — METFORMIN HCL 500 MG PO TABS
500.0000 mg | ORAL_TABLET | Freq: Every day | ORAL | Status: DC
  Administered 2015-04-27: 500 mg via ORAL
  Filled 2015-04-27: qty 1

## 2015-04-27 MED ORDER — INSULIN GLARGINE 100 UNIT/ML ~~LOC~~ SOLN: 40.0000 [IU] | Freq: Every day | SUBCUTANEOUS | Status: DC

## 2015-04-27 NOTE — Consult Note (Signed)
   [facility] Inpatient Consult   04/27/2015  [name] [birth year] [id number] Patient was assessed for [place] [facility] for community services. Patient was recently active with [place] [facility] with care management transition and he feltt he had met his goals per notes..  Met with patient he states that he was doing well until he started the steroids.  Patient agreed in the restarting with [facility] services for post hospital follow up.. Consent form signed previously. Patient will receive post hospital follow up calls and be assessed for home visits.   Of note, [facility] Care Management services does not replace or interfere with any services that are arranged by inpatient case management or social work. For additional questions or referrals please contact: Natividad Brood, RN BSN [facility] Liaison  [phone] business mobile phone Toll free office [phone]

## 2015-04-27 NOTE — Discharge Summary (Signed)
Physician Discharge Summary  [name] [id number] DOB: 04-Jan-1951 DOA: 04/25/2015  PCP: Hoyt Koch, MD  Admit date: 04/25/2015 Discharge date: 04/27/2015  Time spent: 45 minutes  Recommendations for Outpatient Follow-up:  Patient will be discharged to home.  Patient will need to follow up with primary care provider within one week of discharge.  Patient should continue medications as prescribed.  Patient should follow a heart healhty/carb modified diet.   Discharge Diagnoses:  HCAP COPD exacerbation/Asthma Diabetes mellitus, type II Hypertension GERD Insomnia Coronary artery disease Chronic diastolic heart failure  Discharge Condition: Stable  Diet recommendation: heart healthy/carb modified  Filed Weights   04/25/15 1817 04/26/15 0500 04/27/15 0500  Weight: 80 kg (176 lb 5.9 oz) 81.375 kg (179 lb 6.4 oz) 79.878 kg (176 lb 1.6 oz)    History of present illness:  65 year old male history of hypertension, COPD, diastolic heart failure presents to the emergency department with complaints of cough and shortness of breath. Patient recently been hospitalized last month for a couple of days for community-acquired pneumonia and COPD exacerbation. Patient states he's been having productive cough and shortness of breath for approximately 5 days before coming to the hospital.  Hospital Course:  HCAP -CXR on 04/25/15 showed interval minimal patchy density at the left lung base suspicious for pneumonia, mild bronchitic changes  -Initially started on vancomycin and cefepime, transitioned to Levaquin -Influenza, strep pneumonia urine antigen and legionella antigens negative  COPD exacerbation/ asthma  -Improved -Continue Spiriva,  Nebs, albuterol  -Will continue short prednisone taper upon discharge  -Patient should also follow up with pulmonology and/or ENT as an outpatient (due to history of Vocal cord dysfunction)  Diabetes mellitus, type II -Continue home regimen on  discharge -Increased lantus to 40mg  daily given steroids -Follow up with PCP or endocrinologist at discharge  Hypertension  -Continue hyzaar  GERD  -Continue PPI   Insomnia  -Continue trazodone PRN  CAD -Currently chest pain-free -continue aspirin, statin  Chronic diastolic heart failure -Currently appears to be euvolemic -BNP 56 -echocardiogram 12/10/2013 showed an EF of 123456, grade 1 diastolic dysfunction   Procedures: None  Consultations: None  Discharge Exam: Filed Vitals:   04/27/15 0900 04/27/15 0950  BP: 111/35 161/90  Pulse: 106 97  Temp: 97.5 F (36.4 C)   Resp: 20      General: Well developed, well nourished, NAD, appears stated age  HEENT: NCAT, mucous membranes moist.  Cardiovascular: S1 S2 auscultated, no rubs, murmurs or gallops. Regular rate and rhythm.  Respiratory: Mild exp wheezing in RLL, otherwise clear  Abdomen: Soft, nontender, nondistended, + bowel sounds  Extremities: warm dry without cyanosis clubbing or edema  Neuro: AAOx3, nonfocal  Psych: Normal affect and demeanor with intact judgement and insight  Discharge Instructions      Discharge Instructions    Discharge instructions    Complete by:  As directed   Patient will be discharged to home.  Patient will need to follow up with primary care provider within one week of discharge.  Patient should continue medications as prescribed.  Patient should follow a heart healhty/carb modified diet.            Medication List    STOP taking these medications        Insulin Glargine 100 UNIT/ML Solostar Pen  Commonly known as:  LANTUS SOLOSTAR  Replaced by:  insulin glargine 100 UNIT/ML injection      TAKE these medications        albuterol (2.5  MG/3ML) 0.083% nebulizer solution  Commonly known as:  PROVENTIL  Take 3 mLs (2.5 mg total) by nebulization every 4 (four) hours as needed for wheezing or shortness of breath.     albuterol 108 (90 Base) MCG/ACT inhaler    Commonly known as:  PROVENTIL HFA;VENTOLIN HFA  Inhale 2 puffs into the lungs every 6 (six) hours as needed for wheezing or shortness of breath.     aspirin EC 81 MG tablet  Take 1 tablet (81 mg total) by mouth daily.     cholecalciferol 1000 units tablet  Commonly known as:  VITAMIN D  Take 1 tablet (1,000 Units total) by mouth daily.     Cinnamon 500 MG capsule  Take 500 mg by mouth 2 (two) times daily.     Fish Oil 1000 MG Caps  Take 2 capsules by mouth daily.     glucose blood test strip  Use as instructed to check blood sugar four times a day. DXE11.09     guaiFENesin-codeine 100-10 MG/5ML syrup  Take 5 mLs by mouth every 6 (six) hours as needed for cough.     hydrOXYzine 25 MG tablet  Commonly known as:  ATARAX/VISTARIL  Take 1 tablet (25 mg total) by mouth every 6 (six) hours.     insulin glargine 100 UNIT/ML injection  Commonly known as:  LANTUS  Inject 0.4 mLs (40 Units total) into the skin daily.     Insulin Pen Needle 32G X 6 MM Misc  Commonly known as:  NOVOFINE  Use to inject insulin 1 time per day.     Lancets Misc  Use to test sugar four times daily. DX E11.09     levofloxacin 500 MG tablet  Commonly known as:  LEVAQUIN  Take 1 tablet (500 mg total) by mouth daily.     losartan-hydrochlorothiazide 100-12.5 MG tablet  Commonly known as:  HYZAAR  Take 1 tablet by mouth daily.     metFORMIN 1000 MG tablet  Commonly known as:  GLUCOPHAGE  TAKE 1 TABLET TWICE DAILY WITH MEALS     omeprazole 40 MG capsule  Commonly known as:  PRILOSEC  Take 40 mg by mouth daily.     rosuvastatin 20 MG tablet  Commonly known as:  CRESTOR  Take 1 tablet (20 mg total) by mouth daily.     SPIRIVA HANDIHALER 18 MCG inhalation capsule  Generic drug:  tiotropium  Take 1 puff by mouth daily. Reported on 03/16/2015     traZODone 50 MG tablet  Commonly known as:  DESYREL  TAKE ONE TABLET BY MOUTH AT BEDTIME AS NEEDED FOR SLEEP     umeclidinium-vilanterol 62.5-25  MCG/INH Aepb  Commonly known as:  ANORO ELLIPTA  Inhale 1 puff into the lungs daily.       Allergies  Allergen Reactions  . Crestor [Rosuvastatin] Other (See Comments)    intolerance  . Mobic [Meloxicam] Rash      The results of significant diagnostics from this hospitalization (including imaging, microbiology, ancillary and laboratory) are listed below for reference.    Significant Diagnostic Studies: Dg Chest 2 View  04/25/2015  CLINICAL DATA:  Cough and shortness of breath.  COPD.  Asthma. EXAM: CHEST  2 VIEW COMPARISON:  03/08/2015. FINDINGS: Normal sized heart. Interval minimal patchy density at the left lung base. Mild diffuse peribronchial thickening. Minimal thoracic spine degenerative changes. IMPRESSION: 1. Interval minimal patchy density at the left lung base, suspicious for pneumonia. 2. Mild bronchitic changes. Electronically Signed  By: Claudie Revering M.D.   On: 04/25/2015 14:05    Microbiology: Recent Results (from the past 240 hour(s))  Blood culture (routine x 2)     Status: None (Preliminary result)   Collection Time: 04/25/15  2:38 PM  Result Value Ref Range Status   Specimen Description BLOOD LEFT ANTECUBITAL  Final   Special Requests BOTTLES DRAWN AEROBIC AND ANAEROBIC 5CC  Final   Culture NO GROWTH 1 DAY  Final   Report Status PENDING  Incomplete  Blood culture (routine x 2)     Status: None (Preliminary result)   Collection Time: 04/25/15  2:45 PM  Result Value Ref Range Status   Specimen Description BLOOD RIGHT ANTECUBITAL  Final   Special Requests BOTTLES DRAWN AEROBIC AND ANAEROBIC 5CC  Final   Culture NO GROWTH 1 DAY  Final   Report Status PENDING  Incomplete     Labs: Basic Metabolic Panel:  Recent Labs Lab 04/25/15 1308 04/25/15 1832 04/26/15 0551 04/27/15 0547  NA 138  --  134* 138  K 4.4  --  4.5 4.2  CL 101  --  98* 101  CO2 23  --  24 29  GLUCOSE 261*  --  347* 255*  BUN 12  --  19 21*  CREATININE 1.01 0.87 1.06 0.89  CALCIUM 9.4   --  9.0 9.2   Liver Function Tests:  Recent Labs Lab 04/26/15 0551  AST 49*  ALT 80*  ALKPHOS 45  BILITOT 0.5  PROT 6.6  ALBUMIN 3.6   No results for input(s): LIPASE, AMYLASE in the last 168 hours. No results for input(s): AMMONIA in the last 168 hours. CBC:  Recent Labs Lab 04/25/15 1308 04/25/15 1832 04/26/15 0551 04/27/15 0547  WBC 6.5 6.3 5.6 8.5  NEUTROABS 1.9  --  3.9  --   HGB 13.7 13.1 12.6* 12.2*  HCT 41.2 40.6 39.0 37.3*  MCV 84.8 85.1 84.8 85.2  PLT 227 214 228 209   Cardiac Enzymes: No results for input(s): CKTOTAL, CKMB, CKMBINDEX, TROPONINI in the last 168 hours. BNP: BNP (last 3 results)  Recent Labs  02/17/15 1330 03/08/15 1905 04/26/15 0953  BNP 21.5 18.1 56.5    ProBNP (last 3 results) No results for input(s): PROBNP in the last 8760 hours.  CBG:  Recent Labs Lab 04/26/15 0534 04/26/15 1104 04/26/15 1621 04/26/15 2123 04/27/15 0600  GLUCAP 324* 303* 242* 325* 237*       Signed:  Cristal Ford  [facility] 04/27/2015, 10:42 AM

## 2015-04-27 NOTE — Discharge Instructions (Signed)
Asthma, Adult Asthma is a recurring condition in which the airways tighten and narrow. Asthma can make it difficult to breathe. It can cause coughing, wheezing, and shortness of breath. Asthma episodes, also called asthma attacks, range from minor to life-threatening. Asthma cannot be cured, but medicines and lifestyle changes can help control it. CAUSES Asthma is believed to be caused by inherited (genetic) and environmental factors, but its exact cause is unknown. Asthma may be triggered by allergens, lung infections, or irritants in the air. Asthma triggers are different for each person. Common triggers include:   Animal dander.  Dust mites.  Cockroaches.  Pollen from trees or grass.  Mold.  Smoke.  Air pollutants such as dust, household cleaners, hair sprays, aerosol sprays, paint fumes, strong chemicals, or strong odors.  Cold air, weather changes, and winds (which increase molds and pollens in the air).  Strong emotional expressions such as crying or laughing hard.  Stress.  Certain medicines (such as aspirin) or types of drugs (such as beta-blockers).  Sulfites in foods and drinks. Foods and drinks that may contain sulfites include dried fruit, potato chips, and sparkling grape juice.  Infections or inflammatory conditions such as the flu, a cold, or an inflammation of the nasal membranes (rhinitis).  Gastroesophageal reflux disease (GERD).  Exercise or strenuous activity. SYMPTOMS Symptoms may occur immediately after asthma is triggered or many hours later. Symptoms include:  Wheezing.  Excessive nighttime or early morning coughing.  Frequent or severe coughing with a common cold.  Chest tightness.  Shortness of breath. DIAGNOSIS  The diagnosis of asthma is made by a review of your medical history and a physical exam. Tests may also be performed. These may include:  Lung function studies. These tests show how much air you breathe in and out.  Allergy  tests.  Imaging tests such as X-rays. TREATMENT  Asthma cannot be cured, but it can usually be controlled. Treatment involves identifying and avoiding your asthma triggers. It also involves medicines. There are 2 classes of medicine used for asthma treatment:   Controller medicines. These prevent asthma symptoms from occurring. They are usually taken every day.  Reliever or rescue medicines. These quickly relieve asthma symptoms. They are used as needed and provide short-term relief. Your health care provider will help you create an asthma action plan. An asthma action plan is a written plan for managing and treating your asthma attacks. It includes a list of your asthma triggers and how they may be avoided. It also includes information on when medicines should be taken and when their dosage should be changed. An action plan may also involve the use of a device called a peak flow meter. A peak flow meter measures how well the lungs are working. It helps you monitor your condition. HOME CARE INSTRUCTIONS   Take medicines only as directed by your health care provider. Speak with your health care provider if you have questions about how or when to take the medicines.  Use a peak flow meter as directed by your health care provider. Record and keep track of readings.  Understand and use the action plan to help minimize or stop an asthma attack without needing to seek medical care.  Control your home environment in the following ways to help prevent asthma attacks:  Do not smoke. Avoid being exposed to secondhand smoke.  Change your heating and air conditioning filter regularly.  Limit your use of fireplaces and wood stoves.  Get rid of pests (such as roaches  and mice) and their droppings.  Throw away plants if you see mold on them.  Clean your floors and dust regularly. Use unscented cleaning products.  Try to have someone else vacuum for you regularly. Stay out of rooms while they are  being vacuumed and for a short while afterward. If you vacuum, use a dust mask from a hardware store, a double-layered or microfilter vacuum cleaner bag, or a vacuum cleaner with a HEPA filter.  Replace carpet with wood, tile, or vinyl flooring. Carpet can trap dander and dust.  Use allergy-proof pillows, mattress covers, and box spring covers.  Wash bed sheets and blankets every week in hot water and dry them in a dryer.  Use blankets that are made of polyester or cotton.  Clean bathrooms and kitchens with bleach. If possible, have someone repaint the walls in these rooms with mold-resistant paint. Keep out of the rooms that are being cleaned and painted.  Wash hands frequently. SEEK MEDICAL CARE IF:  1. You have wheezing, shortness of breath, or a cough even if taking medicine to prevent attacks. 2. The colored mucus you cough up (sputum) is thicker than usual. 3. Your sputum changes from clear or white to yellow, green, gray, or bloody. 4. You have any problems that may be related to the medicines you are taking (such as a rash, itching, swelling, or trouble breathing). 5. You are using a reliever medicine more than 2-3 times per week. 6. Your peak flow is still at 50-79% of your personal best after following your action plan for 1 hour. 7. You have a fever. SEEK IMMEDIATE MEDICAL CARE IF:   You seem to be getting worse and are unresponsive to treatment during an asthma attack.  You are short of breath even at rest.  You get short of breath when doing very little physical activity.  You have difficulty eating, drinking, or talking due to asthma symptoms.  You develop chest pain.  You develop a fast heartbeat.  You have a bluish color to your lips or fingernails.  You are light-headed, dizzy, or faint.  Your peak flow is less than 50% of your personal best.   This information is not intended to replace advice given to you by your health care provider. Make sure you discuss  any questions you have with your health care provider.   Document Released: 02/19/2005 Document Revised: 11/10/2014 Document Reviewed: 09/18/2012 Elsevier Interactive Patient Education 2016 Elsevier Inc. Chronic Obstructive Pulmonary Disease Chronic obstructive pulmonary disease (COPD) is a common lung condition in which airflow from the lungs is limited. COPD is a general term that can be used to describe many different lung problems that limit airflow, including both chronic bronchitis and emphysema. If you have COPD, your lung function will probably never return to normal, but there are measures you can take to improve lung function and make yourself feel better. CAUSES   Smoking (common).  Exposure to secondhand smoke.  Genetic problems.  Chronic inflammatory lung diseases or recurrent infections. SYMPTOMS  Shortness of breath, especially with physical activity.  Deep, persistent (chronic) cough with a large amount of thick mucus.  Wheezing.  Rapid breaths (tachypnea).  Gray or bluish discoloration (cyanosis) of the skin, especially in your fingers, toes, or lips.  Fatigue.  Weight loss.  Frequent infections or episodes when breathing symptoms become much worse (exacerbations).  Chest tightness. DIAGNOSIS Your health care provider will take a medical history and perform a physical examination to diagnose COPD. Additional tests  for COPD may include:  Lung (pulmonary) function tests.  Chest X-ray.  CT scan.  Blood tests. TREATMENT  Treatment for COPD may include:  Inhaler and nebulizer medicines. These help manage the symptoms of COPD and make your breathing more comfortable.  Supplemental oxygen. Supplemental oxygen is only helpful if you have a low oxygen level in your blood.  Exercise and physical activity. These are beneficial for nearly all people with COPD.  Lung surgery or transplant.  Nutrition therapy to gain weight, if you are  underweight.  Pulmonary rehabilitation. This may involve working with a team of health care providers and specialists, such as respiratory, occupational, and physical therapists. HOME CARE INSTRUCTIONS  Take all medicines (inhaled or pills) as directed by your health care provider.  Avoid over-the-counter medicines or cough syrups that dry up your airway (such as antihistamines) and slow down the elimination of secretions unless instructed otherwise by your health care provider.  If you are a smoker, the most important thing that you can do is stop smoking. Continuing to smoke will cause further lung damage and breathing trouble. Ask your health care provider for help with quitting smoking. He or she can direct you to community resources or hospitals that provide support.  Avoid exposure to irritants such as smoke, chemicals, and fumes that aggravate your breathing.  Use oxygen therapy and pulmonary rehabilitation if directed by your health care provider. If you require home oxygen therapy, ask your health care provider whether you should purchase a pulse oximeter to measure your oxygen level at home.  Avoid contact with individuals who have a contagious illness.  Avoid extreme temperature and humidity changes.  Eat healthy foods. Eating smaller, more frequent meals and resting before meals may help you maintain your strength.  Stay active, but balance activity with periods of rest. Exercise and physical activity will help you maintain your ability to do things you want to do.  Preventing infection and hospitalization is very important when you have COPD. Make sure to receive all the vaccines your health care provider recommends, especially the pneumococcal and influenza vaccines. Ask your health care provider whether you need a pneumonia vaccine.  Learn and use relaxation techniques to manage stress.  Learn and use controlled breathing techniques as directed by your health care provider.  Controlled breathing techniques include:  Pursed lip breathing. Start by breathing in (inhaling) through your nose for 1 second. Then, purse your lips as if you were going to whistle and breathe out (exhale) through the pursed lips for 2 seconds.  Diaphragmatic breathing. Start by putting one hand on your abdomen just above your waist. Inhale slowly through your nose. The hand on your abdomen should move out. Then purse your lips and exhale slowly. You should be able to feel the hand on your abdomen moving in as you exhale.  Learn and use controlled coughing to clear mucus from your lungs. Controlled coughing is a series of short, progressive coughs. The steps of controlled coughing are: 8. Lean your head slightly forward. 9. Breathe in deeply using diaphragmatic breathing. 10. Try to hold your breath for 3 seconds. 11. Keep your mouth slightly open while coughing twice. 12. Spit any mucus out into a tissue. 13. Rest and repeat the steps once or twice as needed. SEEK MEDICAL CARE IF:  You are coughing up more mucus than usual.  There is a change in the color or thickness of your mucus.  Your breathing is more labored than usual.  Your  breathing is faster than usual. SEEK IMMEDIATE MEDICAL CARE IF:  You have shortness of breath while you are resting.  You have shortness of breath that prevents you from:  Being able to talk.  Performing your usual physical activities.  You have chest pain lasting longer than 5 minutes.  Your skin color is more cyanotic than usual.  You measure low oxygen saturations for longer than 5 minutes with a pulse oximeter. MAKE SURE YOU:  Understand these instructions.  Will watch your condition.  Will get help right away if you are not doing well or get worse.   This information is not intended to replace advice given to you by your health care provider. Make sure you discuss any questions you have with your health care provider.   Document  Released: 11/29/2004 Document Revised: 03/12/2014 Document Reviewed: 10/16/2012 Elsevier Interactive Patient Education [employer].

## 2015-04-28 ENCOUNTER — Telehealth: Payer: Self-pay | Admitting: Internal Medicine

## 2015-04-28 MED ORDER — PREDNISONE 20 MG PO TABS
40.0000 mg | ORAL_TABLET | Freq: Every day | ORAL | Status: DC
Start: 2015-04-28 — End: 2015-05-11

## 2015-04-28 NOTE — Telephone Encounter (Signed)
Patient has an appt for a hospital follow up with you. Please advise on below note, thanks.

## 2015-04-28 NOTE — Telephone Encounter (Signed)
Pt was just released from hospital and he thought he was supposed to be on prednisone. He called the hospital regarding this and they told him to call his PCP. They also increased his insulin to 40. He wants to be sure he is taking what he needs to. He has an appointment coming up on March 9th.  His pharmacy is [facility] on [address] if you call in the prednisone.  Can you please give him a call

## 2015-04-28 NOTE — Telephone Encounter (Signed)
Sent in prednisone. Take 2 pills daily for 5 days. Would recommend insulin as they advised. If low sugars or sugars >200 consistently call office for advice.

## 2015-04-28 NOTE — Telephone Encounter (Signed)
Patient aware.

## 2015-04-29 ENCOUNTER — Other Ambulatory Visit: Payer: Self-pay | Admitting: *Deleted

## 2015-04-29 NOTE — Patient Outreach (Signed)
Call received back from member.  He state that he is "feeling a whole lot better."  He reports that he is taking all of his previous medications in addition to the prednisone and antibiotics that he was prescribed.  He state that since he has been taking the prednisone, his blood sugars have been elevated, but manageable.  Today it was 139.  He denies any fever, shortness of breath, or pain/discomfort.  He has a follow up appointment scheduled for 3/9 and report that he will ride the city bus (he state he ride for free).  He state that he does have a pulmonologist, but is not able to schedule an appointment at this time due to finances (unable to pay the copay).  He report he will schedule an appointment when he has the money.    He denies any concerns at this time, stating that he has everything that he need and is following discharge instructions. Encouraged to contact this care manager with questions.  Contact information provided.  Will continue with weekly transition of care calls next week.  Initial home visit scheduled for 3/7.  [name], BSN, [place] Management  [facility] Care Manager [phone]

## 2015-04-29 NOTE — Patient Outreach (Signed)
Referral received from hospital liaison, member recently admitted for pneumonia, discharged 2/22.  According to chart, he has a history of COPD, diabetes, hypertension, and heart failure.  He was recently in a transition of care program with another care manager which was completed earlier this month.  Call placed to member, no answer, HIPPA compliant voice message left.  Will await call back, will make second attempt next week.  Valente David, BSN, [place] Management  [facility] Care Manager [phone]

## 2015-04-30 LAB — CULTURE, BLOOD (ROUTINE X 2)
CULTURE: NO GROWTH
Culture: NO GROWTH

## 2015-05-02 ENCOUNTER — Encounter: Payer: Self-pay | Admitting: *Deleted

## 2015-05-02 ENCOUNTER — Telehealth: Payer: Self-pay | Admitting: Internal Medicine

## 2015-05-02 NOTE — Telephone Encounter (Signed)
[facility] called to advise that patient declined their TCM services. States that he has a f/u scheduled, and has all the meds he needs.

## 2015-05-04 ENCOUNTER — Other Ambulatory Visit: Payer: Self-pay | Admitting: Internal Medicine

## 2015-05-06 ENCOUNTER — Other Ambulatory Visit: Payer: Self-pay | Admitting: *Deleted

## 2015-05-06 ENCOUNTER — Ambulatory Visit: Payer: Commercial Managed Care - HMO | Admitting: Endocrinology

## 2015-05-06 DIAGNOSIS — I5041 Acute combined systolic (congestive) and diastolic (congestive) heart failure: Secondary | ICD-10-CM

## 2015-05-06 DIAGNOSIS — E1169 Type 2 diabetes mellitus with other specified complication: Secondary | ICD-10-CM

## 2015-05-06 DIAGNOSIS — I10 Essential (primary) hypertension: Secondary | ICD-10-CM

## 2015-05-06 NOTE — Patient Outreach (Signed)
Weekly transition of care call placed to member.  He reports that he continues to improve, denies any shortness of breath or chest discomfort.  He state that he has completed his course of antibiotics and prednisone, and his blood sugars have decreased (115 today).  He denies any concerns at this time.  Initial home visit and follow up appointment with physician scheduled for next week.  Valente David, BSN, [place] Management  [facility] Care Manager [phone]

## 2015-05-09 ENCOUNTER — Ambulatory Visit: Payer: Commercial Managed Care - HMO | Admitting: Pulmonary Disease

## 2015-05-09 ENCOUNTER — Ambulatory Visit: Payer: Commercial Managed Care - HMO | Admitting: Internal Medicine

## 2015-05-10 ENCOUNTER — Encounter: Payer: Self-pay | Admitting: *Deleted

## 2015-05-10 ENCOUNTER — Other Ambulatory Visit: Payer: Self-pay | Admitting: *Deleted

## 2015-05-10 NOTE — Patient Outreach (Signed)
[name]) Care Management   05/10/2015  [name] Apr 22, 1950 [id number]  [name] is an 65 y.o. male  Subjective:   Member states that he is doing "real good.  I'm coming along real good."  He state that he has identified how to stay healthy, which is wearing a mask each time he goes outside, especially when the weather has been changing frequently.  He state that he went out of the home a couple days ago without the mask and immediately noticed the difference in his breathing and had to use his rescue inhaler.  He reports that his shortness of breath has decreased in frequency overall, and he has been able to increase his tolerance for activity/exercise.  Member state that he has been taking all of his medications without problems.  Objective:   Review of Systems  Constitutional: Negative.   HENT: Negative.   Eyes: Negative.   Respiratory: Positive for shortness of breath.        With activity   Cardiovascular: Negative.   Gastrointestinal: Negative.   Genitourinary: Negative.   Musculoskeletal: Negative.   Skin: Negative.   Neurological: Negative.   Endo/Heme/Allergies: Negative.   Psychiatric/Behavioral: Negative.     Physical Exam  Constitutional: He is oriented to person, place, and time. He appears well-developed and well-nourished.  Neck: Normal range of motion.  Cardiovascular: Normal rate, regular rhythm and normal heart sounds.   Respiratory: Effort normal and breath sounds normal.  GI: Soft. Bowel sounds are normal.  Musculoskeletal: Normal range of motion.  Neurological: He is alert and oriented to person, place, and time.  Skin: Skin is warm and dry.    BP 132/78 mmHg  Pulse 87  Resp 20  Ht 1.676 m (5\' 6" )  Wt 178 lb (80.74 kg)  BMI 28.74 kg/m2  SpO2 98%   Current Medications:   Current Outpatient Prescriptions  Medication Sig Dispense Refill  . aspirin EC 81 MG tablet Take 1 tablet (81 mg total) by mouth daily. 30 tablet 3   . cholecalciferol (VITAMIN D) 1000 UNITS tablet Take 1 tablet (1,000 Units total) by mouth daily. 30 tablet 0  . Cinnamon 500 MG capsule Take 500 mg by mouth 2 (two) times daily.    [name] glucose blood test strip Use as instructed to check blood sugar four times a day. DXE11.09 400 each 1  . guaiFENesin-codeine 100-10 MG/5ML syrup Take 5 mLs by mouth every 6 (six) hours as needed for cough. 120 mL 0  . insulin glargine (LANTUS) 100 UNIT/ML injection Inject 0.4 mLs (40 Units total) into the skin daily. 10 mL 2  . Insulin Pen Needle (NOVOFINE) 32G X 6 MM MISC Use to inject insulin 1 time per day. 90 each 2  . Lancets MISC Use to test sugar four times daily. DX E11.09 400 each 1  . losartan-hydrochlorothiazide (HYZAAR) 100-12.5 MG per tablet Take 1 tablet by mouth daily. 30 tablet 6  . metFORMIN (GLUCOPHAGE) 1000 MG tablet TAKE 1 TABLET TWICE DAILY WITH MEALS 60 tablet 6  . Omega-3 Fatty Acids (FISH OIL) 1000 MG CAPS Take 2 capsules by mouth daily.    [name] omeprazole (PRILOSEC) 40 MG capsule Take 40 mg by mouth daily.    . traZODone (DESYREL) 50 MG tablet TAKE ONE TABLET BY MOUTH AT BEDTIME AS NEEDED FOR SLEEP 30 tablet 0  . Umeclidinium-Vilanterol 62.5-25 MCG/INH AEPB Inhale 1 puff into the lungs daily. 60 each 6  . VENTOLIN HFA 108 (90  Base) MCG/ACT inhaler INHALE TWO PUFFS BY MOUTH EVERY 6 HOURS AS NEEDED FOR WHEEZING FOR SHORTNESS OF BREATH 54 each 0  . albuterol (PROVENTIL) (2.5 MG/3ML) 0.083% nebulizer solution Take 3 mLs (2.5 mg total) by nebulization every 4 (four) hours as needed for wheezing or shortness of breath. (Patient not taking: Reported on 04/25/2015) 120 mL 5  . hydrOXYzine (ATARAX/VISTARIL) 25 MG tablet Take 1 tablet (25 mg total) by mouth every 6 (six) hours. (Patient not taking: Reported on 05/10/2015) 120 tablet 5  . levofloxacin (LEVAQUIN) 500 MG tablet Take 1 tablet (500 mg total) by mouth daily. (Patient not taking: Reported on 05/10/2015) 4 tablet 0  . predniSONE (DELTASONE) 20 MG  tablet Take 2 tablets (40 mg total) by mouth daily with breakfast. (Patient not taking: Reported on 05/10/2015) 10 tablet 0  . rosuvastatin (CRESTOR) 20 MG tablet Take 1 tablet (20 mg total) by mouth daily. (Patient not taking: Reported on 04/29/2015) 30 tablet 6  . SPIRIVA HANDIHALER 18 MCG inhalation capsule Take 1 puff by mouth daily. Reported on 05/10/2015     No current facility-administered medications for this visit.    Functional Status:   In your present state of health, do you have any difficulty performing the following activities: 05/10/2015 04/27/2015  Hearing? N N  Vision? N N  Difficulty concentrating or making decisions? N N  Walking or climbing stairs? N N  Dressing or bathing? N N  Doing errands, shopping? N N  Preparing Food and eating ? N -  Using the Toilet? N -  In the past six months, have you accidently leaked urine? N -  Do you have problems with loss of bowel control? N -  Managing your Medications? N -  Managing your Finances? N -  Housekeeping or managing your Housekeeping? N -    Fall/Depression Screening:    PHQ 2/9 Scores 05/10/2015 03/16/2015 03/31/2014 08/20/2012  PHQ - 2 Score 0 0 3 0  PHQ- 9 Score - - 6 -   Fall Risk  05/10/2015 03/16/2015 03/31/2014 08/20/2012  Falls in the past year? No No No No     Assessment:    Member denies concerns at this time, aware of PCP appointment scheduled for Thursday.  He takes his medications as instructed, and monitors his blood sugar daily.  He state that he is going to purchase a blood pressure machine, scale, and pulse oximeter this week to continue self monitoring.     He has history of diabetes, A1C - 10.6  Appropriate diet discussed, he state that he has cut down on his carbs and is able to obtain more fresh fruits and vegetables.  He report that can be hard with his income, but has found resources that provide food several times a month.  Member denies questions at this time.  Encouraged to contact with  concerns.  Plan:   Will continue with weekly transition of care calls next week.  THN CM Care Plan Problem One        Most Recent Value   Care Plan Problem One  Recent hospital admission   Role Documenting the Problem One  Care Management [place] for Problem One  Active   THN Long Term Goal (31-90 days)  Member will not be readmitted to hospital within the next 31 days   THN Long Term Goal Start Date  04/29/15   Interventions for Problem One Long Term Goal  Discussed with member the importance of following  discharge instructions, including follow up appointments, medications, diet, and home health involvement, to decrease the risk of readmission   THN CM Short Term Goal #1 (0-30 days)  Member will have follow up appointment within the next 4 weeks   THN CM Short Term Goal #1 Start Date  04/29/15   Interventions for Short Term Goal #1  Discussed with member the importance of following discharge instructions, including follow up appointments, medications, diet, and home health involvement, to decrease the risk of readmission   THN CM Short Term Goal #2 (0-30 days)  Member will report taking all medications as prescribed over the next 4 weeks   THN CM Short Term Goal #2 Start Date  04/29/15   Interventions for Short Term Goal #2  Discussed with member the importance of following discharge instructions, including follow up appointments, medications, diet, and home health involvement, to decrease the risk of readmission     Valente David, BSN, [place] Manager [phone]

## 2015-05-11 ENCOUNTER — Emergency Department (HOSPITAL_COMMUNITY)
Admission: EM | Admit: 2015-05-11 | Discharge: 2015-05-11 | Disposition: A | Discharge status: Home / Self Care | Payer: Commercial Managed Care - HMO | Attending: Emergency Medicine | Admitting: Emergency Medicine

## 2015-05-11 ENCOUNTER — Emergency Department (HOSPITAL_COMMUNITY): Payer: Commercial Managed Care - HMO

## 2015-05-11 ENCOUNTER — Telehealth: Payer: Self-pay | Admitting: Endocrinology

## 2015-05-11 ENCOUNTER — Encounter (HOSPITAL_COMMUNITY): Payer: Self-pay | Admitting: Emergency Medicine

## 2015-05-11 DIAGNOSIS — J441 Chronic obstructive pulmonary disease with (acute) exacerbation: Secondary | ICD-10-CM | POA: Diagnosis not present

## 2015-05-11 DIAGNOSIS — Z87891 Personal history of nicotine dependence: Secondary | ICD-10-CM | POA: Insufficient documentation

## 2015-05-11 DIAGNOSIS — E7800 Pure hypercholesterolemia, unspecified: Secondary | ICD-10-CM | POA: Diagnosis not present

## 2015-05-11 DIAGNOSIS — Z79899 Other long term (current) drug therapy: Secondary | ICD-10-CM | POA: Diagnosis not present

## 2015-05-11 DIAGNOSIS — I5032 Chronic diastolic (congestive) heart failure: Secondary | ICD-10-CM | POA: Diagnosis not present

## 2015-05-11 DIAGNOSIS — Z8701 Personal history of pneumonia (recurrent): Secondary | ICD-10-CM | POA: Diagnosis not present

## 2015-05-11 DIAGNOSIS — R0602 Shortness of breath: Secondary | ICD-10-CM | POA: Diagnosis not present

## 2015-05-11 DIAGNOSIS — Z794 Long term (current) use of insulin: Secondary | ICD-10-CM | POA: Insufficient documentation

## 2015-05-11 DIAGNOSIS — Z7982 Long term (current) use of aspirin: Secondary | ICD-10-CM | POA: Insufficient documentation

## 2015-05-11 DIAGNOSIS — I2510 Atherosclerotic heart disease of native coronary artery without angina pectoris: Secondary | ICD-10-CM | POA: Insufficient documentation

## 2015-05-11 DIAGNOSIS — Z7984 Long term (current) use of oral hypoglycemic drugs: Secondary | ICD-10-CM | POA: Insufficient documentation

## 2015-05-11 DIAGNOSIS — J45901 Unspecified asthma with (acute) exacerbation: Secondary | ICD-10-CM

## 2015-05-11 DIAGNOSIS — Z8619 Personal history of other infectious and parasitic diseases: Secondary | ICD-10-CM | POA: Diagnosis not present

## 2015-05-11 DIAGNOSIS — I10 Essential (primary) hypertension: Secondary | ICD-10-CM | POA: Insufficient documentation

## 2015-05-11 DIAGNOSIS — E119 Type 2 diabetes mellitus without complications: Secondary | ICD-10-CM | POA: Diagnosis not present

## 2015-05-11 LAB — CBC WITH DIFFERENTIAL/PLATELET
BASOS ABS: 0 10*3/uL (ref 0.0–0.1)
BASOS PCT: 0 %
EOS ABS: 0.5 10*3/uL (ref 0.0–0.7)
Eosinophils Relative: 7 %
HEMATOCRIT: 38.8 % — AB (ref 39.0–52.0)
Hemoglobin: 12.6 g/dL — ABNORMAL LOW (ref 13.0–17.0)
Lymphocytes Relative: 44 %
Lymphs Abs: 3 10*3/uL (ref 0.7–4.0)
MCH: 27.6 pg (ref 26.0–34.0)
MCHC: 32.5 g/dL (ref 30.0–36.0)
MCV: 85.1 fL (ref 78.0–100.0)
MONO ABS: 0.5 10*3/uL (ref 0.1–1.0)
Monocytes Relative: 7 %
NEUTROS ABS: 2.9 10*3/uL (ref 1.7–7.7)
Neutrophils Relative %: 42 %
PLATELETS: 208 10*3/uL (ref 150–400)
RBC: 4.56 MIL/uL (ref 4.22–5.81)
RDW: 13.9 % (ref 11.5–15.5)
WBC: 6.9 10*3/uL (ref 4.0–10.5)

## 2015-05-11 LAB — BASIC METABOLIC PANEL
ANION GAP: 14 (ref 5–15)
BUN: 17 mg/dL (ref 6–20)
CO2: 24 mmol/L (ref 22–32)
CREATININE: 1.22 mg/dL (ref 0.61–1.24)
Calcium: 9 mg/dL (ref 8.9–10.3)
Chloride: 101 mmol/L (ref 101–111)
GLUCOSE: 254 mg/dL — AB (ref 65–99)
Potassium: 4.3 mmol/L (ref 3.5–5.1)
SODIUM: 139 mmol/L (ref 135–145)

## 2015-05-11 MED ORDER — INSULIN GLARGINE 100 UNIT/ML ~~LOC~~ SOLN: 70.0000 [IU] | Freq: Every day | SUBCUTANEOUS | Status: DC

## 2015-05-11 MED ORDER — IPRATROPIUM BROMIDE 0.02 % IN SOLN
0.5000 mg | Freq: Once | RESPIRATORY_TRACT | Status: AC
  Administered 2015-05-11: 0.5 mg via RESPIRATORY_TRACT
  Filled 2015-05-11: qty 2.5

## 2015-05-11 MED ORDER — PREDNISONE 20 MG PO TABS: 60.0000 mg | ORAL_TABLET | Freq: Every day | ORAL | Status: DC

## 2015-05-11 MED ORDER — ALBUTEROL (5 MG/ML) CONTINUOUS INHALATION SOLN
15.0000 mg/h | INHALATION_SOLUTION | Freq: Once | RESPIRATORY_TRACT | Status: AC
  Administered 2015-05-11: 15 mg/h via RESPIRATORY_TRACT
  Filled 2015-05-11: qty 20

## 2015-05-11 MED ORDER — MAGNESIUM SULFATE 2 GM/50ML IV SOLN
2.0000 g | Freq: Once | INTRAVENOUS | Status: AC
  Administered 2015-05-11: 2 g via INTRAVENOUS

## 2015-05-11 NOTE — ED Notes (Signed)
Patient maintained SpO2 100% during ambulation through hall. HR 100.

## 2015-05-11 NOTE — Telephone Encounter (Signed)
I contacted the pt. He verified he is taking 40u. Pt verbalized understanding and will increase the med to 70 units daily. Pt stated he could not schedule his appointment at this time because he does not have th co-payment. Pt stated he would call back on Friday to report blood sugar.

## 2015-05-11 NOTE — Telephone Encounter (Signed)
Please verify lantus is 40/d Take an extra 30 now Then increase to 70 units qd Ov 2 days Drink plenty of fluids

## 2015-05-11 NOTE — Telephone Encounter (Signed)
Pt called and said his sugar was 395 just now and that he is concerned and wants to be advised as what to do

## 2015-05-11 NOTE — Telephone Encounter (Signed)
Is pt on steroids?

## 2015-05-11 NOTE — Discharge Instructions (Signed)

## 2015-05-11 NOTE — Telephone Encounter (Signed)
Pt is taking 3 20 mg tablets of prednisone every morning until 05/14/2015.
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
## 2015-05-11 NOTE — Telephone Encounter (Signed)
See note below and please advise, Thanks! 

## 2015-05-11 NOTE — ED Notes (Signed)
Patient verbalized understanding of discharge instructions and denies any further needs or questions at this time. VS stable. Patient ambulatory with steady gait.  
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
## 2015-05-12 ENCOUNTER — Ambulatory Visit (INDEPENDENT_AMBULATORY_CARE_PROVIDER_SITE_OTHER): Payer: Commercial Managed Care - HMO | Admitting: Internal Medicine

## 2015-05-12 ENCOUNTER — Encounter: Payer: Self-pay | Admitting: Internal Medicine

## 2015-05-12 ENCOUNTER — Telehealth: Payer: Self-pay | Admitting: Endocrinology

## 2015-05-12 VITALS — BP 170/80 | HR 98 | Temp 98.3°F | Resp 18 | Ht 66.0 in | Wt 183.0 lb

## 2015-05-12 DIAGNOSIS — E118 Type 2 diabetes mellitus with unspecified complications: Secondary | ICD-10-CM

## 2015-05-12 DIAGNOSIS — E119 Type 2 diabetes mellitus without complications: Secondary | ICD-10-CM

## 2015-05-12 DIAGNOSIS — I10 Essential (primary) hypertension: Secondary | ICD-10-CM | POA: Diagnosis not present

## 2015-05-12 DIAGNOSIS — J4531 Mild persistent asthma with (acute) exacerbation: Secondary | ICD-10-CM

## 2015-05-12 DIAGNOSIS — Z794 Long term (current) use of insulin: Secondary | ICD-10-CM

## 2015-05-12 DIAGNOSIS — E1165 Type 2 diabetes mellitus with hyperglycemia: Secondary | ICD-10-CM

## 2015-05-12 DIAGNOSIS — IMO0002 Reserved for concepts with insufficient information to code with codable children: Secondary | ICD-10-CM

## 2015-05-12 NOTE — Telephone Encounter (Signed)
I contacted the pt. Pt stated he has insulin to last him, but he does not have the insulin needles. Pt will come by the office and pick a package of needles up.

## 2015-05-12 NOTE — Patient Instructions (Addendum)
The lungs sound good today, so keep up the good work with using the mask outside and around strong scents.   We will not check any blood work today since they checked it yesterday.   We have put in the referral to the eye doctor.   We also have a wellness coach that you should meet with the same day you come for the wellness with me in the next month or two name Manuela Schwartz who helps [place] to make sure there are no gaps in your health.

## 2015-05-12 NOTE — Telephone Encounter (Signed)
Pt called and said [facility] will not fill his insulin but he said that his dosage has increased and that he will not have enough to last him and would like to talk to you about this.

## 2015-05-12 NOTE — Progress Notes (Signed)
Pre visit review using our clinic review tool, if applicable. No additional management support is needed unless otherwise documented below in the visit note. 

## 2015-05-13 ENCOUNTER — Telehealth: Payer: Self-pay | Admitting: Endocrinology

## 2015-05-13 NOTE — Telephone Encounter (Signed)
Pt said he was told to call and give sugar levels since being on prednisone Today 8:40AM 216 Today 3:50PM 174

## 2015-05-13 NOTE — Telephone Encounter (Signed)
Please verify lantus is 30/d Then increase to 40/d

## 2015-05-13 NOTE — Telephone Encounter (Signed)
Pt advised of note below and voiced understanding.  

## 2015-05-13 NOTE — Telephone Encounter (Signed)
Pt was advise of 05/11/2015 to increase to 70 units daily. Please advise, Thanks!

## 2015-05-13 NOTE — Telephone Encounter (Signed)
Ok, please increase to 80/d

## 2015-05-15 ENCOUNTER — Encounter: Payer: Self-pay | Admitting: Internal Medicine

## 2015-05-15 NOTE — Assessment & Plan Note (Signed)
Doing well on losartan/hctz and adjust as needed. Reviewed recent labs without indication for change.

## 2015-05-15 NOTE — Assessment & Plan Note (Signed)
Has increased his lantus per endocrinology and brought in meter today. Sugars 180-160-200 on the meter from the last several days. He is not having highs and tapering off steroids. He will follow up with endocrinology.
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
## 2015-05-15 NOTE — Assessment & Plan Note (Signed)
Rx for breo and he is still using ventolin and finishing his prednisone. No wheezing on exam and talked to him about avoidance of triggers to help his breathing. Several flares in the last year and may need escalation of therapy if not controlled with the new breo.

## 2015-05-17 ENCOUNTER — Other Ambulatory Visit: Payer: Self-pay | Admitting: *Deleted
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
## 2015-05-19 ENCOUNTER — Telehealth: Payer: Self-pay | Admitting: Endocrinology

## 2015-05-19 NOTE — Telephone Encounter (Signed)
Please see below.

## 2015-05-19 NOTE — Telephone Encounter (Signed)
Please continue the same medication Please move up ov to next available.

## 2015-05-19 NOTE — Telephone Encounter (Signed)
BS has been  3/13 6: 20 am 85; 206 7: 12 pm 3/14 9:05 AM 85; 240 2:20 pm; 4:43 PM 369; 10:45 PM 168 3/15 103 8:45 am; 213 2:15 pm; 315 8:20 pm  3/16 111 at 11:14 am Since increase in the insulin

## 2015-05-20 NOTE — Telephone Encounter (Signed)
Pt advised of note below and voiced understanding.  

## 2015-05-23 ENCOUNTER — Other Ambulatory Visit: Payer: Self-pay | Admitting: Internal Medicine

## 2015-05-23 ENCOUNTER — Telehealth: Payer: Self-pay | Admitting: Endocrinology

## 2015-05-23 NOTE — Telephone Encounter (Signed)
I contacted the pt and advised to come by the office and pick up supply of insulin syringes.

## 2015-05-23 NOTE — Telephone Encounter (Signed)
Pt is on his last sample needle he is asking for more needles please

## 2015-05-24 ENCOUNTER — Other Ambulatory Visit: Payer: Self-pay | Admitting: *Deleted
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
## 2015-05-27 ENCOUNTER — Inpatient Hospital Stay (HOSPITAL_COMMUNITY)
Admission: EM | Admit: 2015-05-27 | Discharge: 2015-06-02 | DRG: 871 | Disposition: A | Discharge status: Home / Self Care | Payer: Commercial Managed Care - HMO | Attending: Internal Medicine | Admitting: Internal Medicine

## 2015-05-27 ENCOUNTER — Encounter (HOSPITAL_COMMUNITY): Payer: Self-pay | Admitting: Emergency Medicine

## 2015-05-27 ENCOUNTER — Emergency Department (HOSPITAL_COMMUNITY): Payer: Commercial Managed Care - HMO

## 2015-05-27 DIAGNOSIS — I5032 Chronic diastolic (congestive) heart failure: Secondary | ICD-10-CM | POA: Diagnosis not present

## 2015-05-27 DIAGNOSIS — E119 Type 2 diabetes mellitus without complications: Secondary | ICD-10-CM | POA: Diagnosis not present

## 2015-05-27 DIAGNOSIS — Z7982 Long term (current) use of aspirin: Secondary | ICD-10-CM | POA: Diagnosis not present

## 2015-05-27 DIAGNOSIS — Z789 Other specified health status: Secondary | ICD-10-CM | POA: Diagnosis not present

## 2015-05-27 DIAGNOSIS — J101 Influenza due to other identified influenza virus with other respiratory manifestations: Secondary | ICD-10-CM | POA: Diagnosis present

## 2015-05-27 DIAGNOSIS — Z87891 Personal history of nicotine dependence: Secondary | ICD-10-CM | POA: Diagnosis not present

## 2015-05-27 DIAGNOSIS — J189 Pneumonia, unspecified organism: Secondary | ICD-10-CM | POA: Diagnosis present

## 2015-05-27 DIAGNOSIS — E7800 Pure hypercholesterolemia, unspecified: Secondary | ICD-10-CM | POA: Diagnosis present

## 2015-05-27 DIAGNOSIS — K219 Gastro-esophageal reflux disease without esophagitis: Secondary | ICD-10-CM | POA: Diagnosis not present

## 2015-05-27 DIAGNOSIS — R7989 Other specified abnormal findings of blood chemistry: Secondary | ICD-10-CM

## 2015-05-27 DIAGNOSIS — J9621 Acute and chronic respiratory failure with hypoxia: Secondary | ICD-10-CM | POA: Diagnosis present

## 2015-05-27 DIAGNOSIS — E785 Hyperlipidemia, unspecified: Secondary | ICD-10-CM

## 2015-05-27 DIAGNOSIS — Z888 Allergy status to other drugs, medicaments and biological substances status: Secondary | ICD-10-CM

## 2015-05-27 DIAGNOSIS — A419 Sepsis, unspecified organism: Secondary | ICD-10-CM | POA: Diagnosis not present

## 2015-05-27 DIAGNOSIS — R109 Unspecified abdominal pain: Secondary | ICD-10-CM | POA: Diagnosis not present

## 2015-05-27 DIAGNOSIS — R6520 Severe sepsis without septic shock: Secondary | ICD-10-CM | POA: Diagnosis not present

## 2015-05-27 DIAGNOSIS — R252 Cramp and spasm: Secondary | ICD-10-CM

## 2015-05-27 DIAGNOSIS — E872 Acidosis, unspecified: Secondary | ICD-10-CM | POA: Diagnosis present

## 2015-05-27 DIAGNOSIS — I10 Essential (primary) hypertension: Secondary | ICD-10-CM | POA: Diagnosis present

## 2015-05-27 DIAGNOSIS — I2510 Atherosclerotic heart disease of native coronary artery without angina pectoris: Secondary | ICD-10-CM | POA: Diagnosis not present

## 2015-05-27 DIAGNOSIS — I739 Peripheral vascular disease, unspecified: Secondary | ICD-10-CM

## 2015-05-27 DIAGNOSIS — Z8249 Family history of ischemic heart disease and other diseases of the circulatory system: Secondary | ICD-10-CM | POA: Diagnosis not present

## 2015-05-27 DIAGNOSIS — B1920 Unspecified viral hepatitis C without hepatic coma: Secondary | ICD-10-CM | POA: Diagnosis present

## 2015-05-27 DIAGNOSIS — Z96652 Presence of left artificial knee joint: Secondary | ICD-10-CM | POA: Diagnosis present

## 2015-05-27 DIAGNOSIS — Z7952 Long term (current) use of systemic steroids: Secondary | ICD-10-CM | POA: Diagnosis not present

## 2015-05-27 DIAGNOSIS — I110 Hypertensive heart disease with heart failure: Secondary | ICD-10-CM | POA: Diagnosis not present

## 2015-05-27 DIAGNOSIS — F329 Major depressive disorder, single episode, unspecified: Secondary | ICD-10-CM

## 2015-05-27 DIAGNOSIS — E1151 Type 2 diabetes mellitus with diabetic peripheral angiopathy without gangrene: Secondary | ICD-10-CM | POA: Diagnosis present

## 2015-05-27 DIAGNOSIS — J441 Chronic obstructive pulmonary disease with (acute) exacerbation: Secondary | ICD-10-CM | POA: Diagnosis present

## 2015-05-27 DIAGNOSIS — R0600 Dyspnea, unspecified: Secondary | ICD-10-CM

## 2015-05-27 DIAGNOSIS — E0801 Diabetes mellitus due to underlying condition with hyperosmolarity with coma: Secondary | ICD-10-CM

## 2015-05-27 DIAGNOSIS — R0602 Shortness of breath: Secondary | ICD-10-CM | POA: Diagnosis not present

## 2015-05-27 DIAGNOSIS — J4531 Mild persistent asthma with (acute) exacerbation: Secondary | ICD-10-CM

## 2015-05-27 DIAGNOSIS — E1165 Type 2 diabetes mellitus with hyperglycemia: Secondary | ICD-10-CM | POA: Diagnosis present

## 2015-05-27 DIAGNOSIS — R140 Abdominal distension (gaseous): Secondary | ICD-10-CM

## 2015-05-27 DIAGNOSIS — Z886 Allergy status to analgesic agent status: Secondary | ICD-10-CM | POA: Diagnosis not present

## 2015-05-27 DIAGNOSIS — J383 Other diseases of vocal cords: Secondary | ICD-10-CM

## 2015-05-27 DIAGNOSIS — Z833 Family history of diabetes mellitus: Secondary | ICD-10-CM | POA: Diagnosis not present

## 2015-05-27 DIAGNOSIS — F32A Depression, unspecified: Secondary | ICD-10-CM

## 2015-05-27 DIAGNOSIS — Z794 Long term (current) use of insulin: Secondary | ICD-10-CM

## 2015-05-27 LAB — CBC WITH DIFFERENTIAL/PLATELET
BASOS ABS: 0 10*3/uL (ref 0.0–0.1)
Basophils Relative: 0 %
Eosinophils Absolute: 0.1 10*3/uL (ref 0.0–0.7)
Eosinophils Relative: 1 %
HEMATOCRIT: 41.3 % (ref 39.0–52.0)
HEMOGLOBIN: 13.4 g/dL (ref 13.0–17.0)
LYMPHS ABS: 1.1 10*3/uL (ref 0.7–4.0)
Lymphocytes Relative: 12 %
MCH: 28 pg (ref 26.0–34.0)
MCHC: 32.4 g/dL (ref 30.0–36.0)
MCV: 86.2 fL (ref 78.0–100.0)
MONOS PCT: 4 %
Monocytes Absolute: 0.3 10*3/uL (ref 0.1–1.0)
Neutro Abs: 7.7 10*3/uL (ref 1.7–7.7)
Neutrophils Relative %: 83 %
Platelets: 227 10*3/uL (ref 150–400)
RBC: 4.79 MIL/uL (ref 4.22–5.81)
RDW: 14.1 % (ref 11.5–15.5)
WBC: 9.3 10*3/uL (ref 4.0–10.5)

## 2015-05-27 LAB — I-STAT TROPONIN, ED: Troponin i, poc: 0.01 ng/mL (ref 0.00–0.08)

## 2015-05-27 LAB — CBG MONITORING, ED: GLUCOSE-CAPILLARY: 213 mg/dL — AB (ref 65–99)

## 2015-05-27 MED ORDER — VANCOMYCIN HCL IN DEXTROSE 1-5 GM/200ML-% IV SOLN
1000.0000 mg | Freq: Once | INTRAVENOUS | Status: AC
  Administered 2015-05-28: 1000 mg via INTRAVENOUS
  Filled 2015-05-27: qty 200

## 2015-05-27 MED ORDER — METHYLPREDNISOLONE SODIUM SUCC 125 MG IJ SOLR
125.0000 mg | Freq: Once | INTRAMUSCULAR | Status: AC
  Administered 2015-05-27: 125 mg via INTRAVENOUS
  Filled 2015-05-27: qty 2

## 2015-05-27 MED ORDER — SODIUM CHLORIDE 0.9 % IV BOLUS (SEPSIS): 500.0000 mL | INTRAVENOUS | Status: AC

## 2015-05-27 MED ORDER — ALBUTEROL SULFATE (2.5 MG/3ML) 0.083% IN NEBU: 5.0000 mg | INHALATION_SOLUTION | Freq: Once | RESPIRATORY_TRACT | Status: DC

## 2015-05-27 MED ORDER — DEXTROSE 5 % IV SOLN
2.0000 g | Freq: Once | INTRAVENOUS | Status: AC
Start: 2015-05-27 — End: 2015-05-28
  Administered 2015-05-28: 2 g via INTRAVENOUS
  Filled 2015-05-27: qty 2

## 2015-05-27 MED ORDER — SODIUM CHLORIDE 0.9 % IV BOLUS (SEPSIS)
1000.0000 mL | Freq: Once | INTRAVENOUS | Status: AC
  Administered 2015-05-27: 1000 mL via INTRAVENOUS

## 2015-05-27 MED ORDER — ALBUTEROL (5 MG/ML) CONTINUOUS INHALATION SOLN
10.0000 mg/h | INHALATION_SOLUTION | RESPIRATORY_TRACT | Status: AC
  Administered 2015-05-27: 10 mg/h via RESPIRATORY_TRACT
  Filled 2015-05-27: qty 20

## 2015-05-27 MED ORDER — SODIUM CHLORIDE 0.9 % IV BOLUS (SEPSIS)
1000.0000 mL | INTRAVENOUS | Status: AC
  Administered 2015-05-28 (×2): 1000 mL via INTRAVENOUS

## 2015-05-27 MED ORDER — IPRATROPIUM BROMIDE 0.02 % IN SOLN
1.0000 mg | Freq: Once | RESPIRATORY_TRACT | Status: AC
  Administered 2015-05-27: 1 mg via RESPIRATORY_TRACT
  Filled 2015-05-27: qty 5

## 2015-05-27 NOTE — ED Notes (Signed)
Pt brought to ED by [facility] from home for c/o SOB, pt was diagnose with PNA 3 weeks ago and finished his medication. Pt has hx of COPD. 5 mg of Albuterol and a Duo neb treatment given by [facility] PTA, VS BP 160/850, R-35 /min, HR 130 SPO2 98% on the neb treatment.

## 2015-05-27 NOTE — ED Notes (Signed)
Patient transported to X-ray 

## 2015-05-28 ENCOUNTER — Inpatient Hospital Stay (HOSPITAL_COMMUNITY): Payer: Commercial Managed Care - HMO

## 2015-05-28 DIAGNOSIS — E872 Acidosis: Secondary | ICD-10-CM

## 2015-05-28 DIAGNOSIS — E119 Type 2 diabetes mellitus without complications: Secondary | ICD-10-CM

## 2015-05-28 DIAGNOSIS — Z794 Long term (current) use of insulin: Secondary | ICD-10-CM | POA: Diagnosis not present

## 2015-05-28 DIAGNOSIS — J101 Influenza due to other identified influenza virus with other respiratory manifestations: Secondary | ICD-10-CM | POA: Diagnosis present

## 2015-05-28 DIAGNOSIS — I110 Hypertensive heart disease with heart failure: Secondary | ICD-10-CM | POA: Diagnosis present

## 2015-05-28 DIAGNOSIS — Z789 Other specified health status: Secondary | ICD-10-CM | POA: Diagnosis not present

## 2015-05-28 DIAGNOSIS — E1151 Type 2 diabetes mellitus with diabetic peripheral angiopathy without gangrene: Secondary | ICD-10-CM | POA: Diagnosis present

## 2015-05-28 DIAGNOSIS — E1165 Type 2 diabetes mellitus with hyperglycemia: Secondary | ICD-10-CM | POA: Diagnosis present

## 2015-05-28 DIAGNOSIS — Z886 Allergy status to analgesic agent status: Secondary | ICD-10-CM | POA: Diagnosis not present

## 2015-05-28 DIAGNOSIS — A419 Sepsis, unspecified organism: Secondary | ICD-10-CM | POA: Diagnosis present

## 2015-05-28 DIAGNOSIS — E7800 Pure hypercholesterolemia, unspecified: Secondary | ICD-10-CM | POA: Diagnosis present

## 2015-05-28 DIAGNOSIS — R0602 Shortness of breath: Secondary | ICD-10-CM | POA: Diagnosis present

## 2015-05-28 DIAGNOSIS — Z833 Family history of diabetes mellitus: Secondary | ICD-10-CM | POA: Diagnosis not present

## 2015-05-28 DIAGNOSIS — Z7952 Long term (current) use of systemic steroids: Secondary | ICD-10-CM | POA: Diagnosis not present

## 2015-05-28 DIAGNOSIS — I5032 Chronic diastolic (congestive) heart failure: Secondary | ICD-10-CM | POA: Diagnosis present

## 2015-05-28 DIAGNOSIS — Z888 Allergy status to other drugs, medicaments and biological substances status: Secondary | ICD-10-CM | POA: Diagnosis not present

## 2015-05-28 DIAGNOSIS — J441 Chronic obstructive pulmonary disease with (acute) exacerbation: Secondary | ICD-10-CM | POA: Diagnosis present

## 2015-05-28 DIAGNOSIS — R6520 Severe sepsis without septic shock: Secondary | ICD-10-CM | POA: Diagnosis present

## 2015-05-28 DIAGNOSIS — J9621 Acute and chronic respiratory failure with hypoxia: Secondary | ICD-10-CM | POA: Diagnosis present

## 2015-05-28 DIAGNOSIS — I2510 Atherosclerotic heart disease of native coronary artery without angina pectoris: Secondary | ICD-10-CM | POA: Diagnosis present

## 2015-05-28 DIAGNOSIS — K219 Gastro-esophageal reflux disease without esophagitis: Secondary | ICD-10-CM

## 2015-05-28 DIAGNOSIS — B1920 Unspecified viral hepatitis C without hepatic coma: Secondary | ICD-10-CM | POA: Diagnosis present

## 2015-05-28 DIAGNOSIS — Z87891 Personal history of nicotine dependence: Secondary | ICD-10-CM | POA: Diagnosis not present

## 2015-05-28 DIAGNOSIS — Z8249 Family history of ischemic heart disease and other diseases of the circulatory system: Secondary | ICD-10-CM | POA: Diagnosis not present

## 2015-05-28 DIAGNOSIS — Z7982 Long term (current) use of aspirin: Secondary | ICD-10-CM | POA: Diagnosis not present

## 2015-05-28 DIAGNOSIS — I10 Essential (primary) hypertension: Secondary | ICD-10-CM | POA: Diagnosis not present

## 2015-05-28 DIAGNOSIS — Z96652 Presence of left artificial knee joint: Secondary | ICD-10-CM | POA: Diagnosis present

## 2015-05-28 LAB — URINE MICROSCOPIC-ADD ON

## 2015-05-28 LAB — PROTIME-INR
INR: 1.08 (ref 0.00–1.49)
PROTHROMBIN TIME: 14.2 s (ref 11.6–15.2)

## 2015-05-28 LAB — URINALYSIS, ROUTINE W REFLEX MICROSCOPIC
Bilirubin Urine: NEGATIVE
Glucose, UA: >1000 mg/dL — AB
Hgb urine dipstick: NEGATIVE
KETONES UR: 15 mg/dL — AB
LEUKOCYTES UA: NEGATIVE
NITRITE: NEGATIVE
PROTEIN: NEGATIVE mg/dL
Specific Gravity, Urine: 1.021 (ref 1.005–1.030)
pH: 5 (ref 5.0–8.0)

## 2015-05-28 LAB — BASIC METABOLIC PANEL
ANION GAP: 18 — AB (ref 5–15)
BUN: 11 mg/dL (ref 6–20)
CHLORIDE: 100 mmol/L — AB (ref 101–111)
CO2: 19 mmol/L — ABNORMAL LOW (ref 22–32)
Calcium: 8.7 mg/dL — ABNORMAL LOW (ref 8.9–10.3)
Creatinine, Ser: 1.09 mg/dL (ref 0.61–1.24)
GFR calc Af Amer: >60 mL/min (ref >60)
Glucose, Bld: 229 mg/dL — ABNORMAL HIGH (ref 65–99)
POTASSIUM: 3.9 mmol/L (ref 3.5–5.1)
SODIUM: 137 mmol/L (ref 135–145)

## 2015-05-28 LAB — STREP PNEUMONIAE URINARY ANTIGEN: STREP PNEUMO URINARY ANTIGEN: NEGATIVE

## 2015-05-28 LAB — PROCALCITONIN: PROCALCITONIN: 0.58 ng/mL

## 2015-05-28 LAB — GLUCOSE, CAPILLARY
GLUCOSE-CAPILLARY: 226 mg/dL — AB (ref 65–99)
GLUCOSE-CAPILLARY: 267 mg/dL — AB (ref 65–99)
Glucose-Capillary: 304 mg/dL — ABNORMAL HIGH (ref 65–99)

## 2015-05-28 LAB — LACTIC ACID, PLASMA
LACTIC ACID, VENOUS: 2.6 mmol/L — AB (ref 0.5–2.0)
LACTIC ACID, VENOUS: 3.2 mmol/L — AB (ref 0.5–2.0)
Lactic Acid, Venous: 10.4 mmol/L (ref 0.5–2.0)
Lactic Acid, Venous: 8.7 mmol/L (ref 0.5–2.0)
Lactic Acid, Venous: 7 mmol/L (ref 0.5–2.0)

## 2015-05-28 LAB — I-STAT ARTERIAL BLOOD GAS, ED
ACID-BASE DEFICIT: 12 mmol/L — AB (ref 0.0–2.0)
Bicarbonate: 13.5 mEq/L — ABNORMAL LOW (ref 20.0–24.0)
O2 SAT: 94 %
PCO2 ART: 31.8 mmHg — AB (ref 35.0–45.0)
TCO2: 14 mmol/L (ref 0–100)
pH, Arterial: 7.245 — ABNORMAL LOW (ref 7.350–7.450)
pO2, Arterial: 90 mmHg (ref 80.0–100.0)

## 2015-05-28 LAB — I-STAT CG4 LACTIC ACID, ED: LACTIC ACID, VENOUS: 6.39 mmol/L — AB (ref 0.5–2.0)

## 2015-05-28 LAB — CBG MONITORING, ED: Glucose-Capillary: 340 mg/dL — ABNORMAL HIGH (ref 65–99)

## 2015-05-28 LAB — INFLUENZA PANEL BY PCR (TYPE A & B)
H1N1FLUPCR: NOT DETECTED
INFLBPCR: POSITIVE — AB
Influenza A By PCR: NEGATIVE

## 2015-05-28 LAB — MRSA PCR SCREENING: MRSA by PCR: NEGATIVE

## 2015-05-28 LAB — BRAIN NATRIURETIC PEPTIDE: B Natriuretic Peptide: 54.9 pg/mL (ref 0.0–100.0)

## 2015-05-28 LAB — APTT: APTT: 34 s (ref 24–37)

## 2015-05-28 MED ORDER — INSULIN GLARGINE 100 UNIT/ML ~~LOC~~ SOLN
50.0000 [IU] | Freq: Every day | SUBCUTANEOUS | Status: DC
  Administered 2015-05-28 – 2015-05-30 (×3): 50 [IU] via SUBCUTANEOUS
  Filled 2015-05-28 (×3): qty 0.5

## 2015-05-28 MED ORDER — LEVALBUTEROL HCL 1.25 MG/0.5ML IN NEBU
1.2500 mg | INHALATION_SOLUTION | Freq: Three times a day (TID) | RESPIRATORY_TRACT | Status: DC
  Administered 2015-05-29: 1.25 mg via RESPIRATORY_TRACT
  Filled 2015-05-28: qty 0.5

## 2015-05-28 MED ORDER — TRAZODONE HCL 50 MG PO TABS
50.0000 mg | ORAL_TABLET | Freq: Every evening | ORAL | Status: DC | PRN
Start: 2015-05-28 — End: 2015-06-02
  Administered 2015-05-28 – 2015-06-01 (×6): 50 mg via ORAL
  Filled 2015-05-28 (×7): qty 1

## 2015-05-28 MED ORDER — SODIUM CHLORIDE 0.9 % IV BOLUS (SEPSIS)
500.0000 mL | Freq: Once | INTRAVENOUS | Status: AC
  Administered 2015-05-28: 500 mL via INTRAVENOUS

## 2015-05-28 MED ORDER — ASPIRIN EC 81 MG PO TBEC
81.0000 mg | DELAYED_RELEASE_TABLET | Freq: Every day | ORAL | Status: DC
  Administered 2015-05-28 – 2015-06-02 (×6): 81 mg via ORAL
  Filled 2015-05-28 (×6): qty 1

## 2015-05-28 MED ORDER — SODIUM CHLORIDE 0.9 % IV BOLUS (SEPSIS)
1000.0000 mL | Freq: Once | INTRAVENOUS | Status: AC
  Administered 2015-05-28: 1000 mL via INTRAVENOUS

## 2015-05-28 MED ORDER — GUAIFENESIN 100 MG/5ML PO SOLN
5.0000 mL | Freq: Once | ORAL | Status: DC
  Filled 2015-05-28: qty 5

## 2015-05-28 MED ORDER — SODIUM CHLORIDE 0.9 % IV SOLN
INTRAVENOUS | Status: DC
  Administered 2015-05-28 (×2): via INTRAVENOUS
  Administered 2015-05-29: 125 mL/h via INTRAVENOUS

## 2015-05-28 MED ORDER — IPRATROPIUM BROMIDE 0.02 % IN SOLN
0.2500 mg | Freq: Four times a day (QID) | RESPIRATORY_TRACT | Status: DC
  Administered 2015-05-28: 0.25 mg via RESPIRATORY_TRACT
  Filled 2015-05-28: qty 2.5

## 2015-05-28 MED ORDER — ACETAMINOPHEN 500 MG PO TABS
1000.0000 mg | ORAL_TABLET | Freq: Once | ORAL | Status: DC
Start: 2015-05-28 — End: 2015-05-28

## 2015-05-28 MED ORDER — IOPAMIDOL (ISOVUE-370) INJECTION 76%
INTRAVENOUS | Status: AC
  Administered 2015-05-28: 100 mL
  Filled 2015-05-28: qty 100

## 2015-05-28 MED ORDER — CRANBERRY 500 MG PO CAPS: 500.0000 mg | ORAL_CAPSULE | Freq: Every day | ORAL | Status: DC

## 2015-05-28 MED ORDER — CINNAMON 500 MG PO CAPS: 500.0000 mg | ORAL_CAPSULE | Freq: Two times a day (BID) | ORAL | Status: DC

## 2015-05-28 MED ORDER — DM-GUAIFENESIN ER 30-600 MG PO TB12
1.0000 | ORAL_TABLET | Freq: Two times a day (BID) | ORAL | Status: DC
  Administered 2015-05-28 (×3): 1 via ORAL
  Filled 2015-05-28 (×2): qty 1
  Filled 2015-05-28: qty 2
  Filled 2015-05-28: qty 1

## 2015-05-28 MED ORDER — GUAIFENESIN-DM 100-10 MG/5ML PO SYRP
5.0000 mL | ORAL_SOLUTION | ORAL | Status: DC | PRN
  Administered 2015-05-28 – 2015-05-29 (×2): 5 mL via ORAL
  Filled 2015-05-28 (×2): qty 5

## 2015-05-28 MED ORDER — METHYLPREDNISOLONE SODIUM SUCC 125 MG IJ SOLR
60.0000 mg | Freq: Three times a day (TID) | INTRAMUSCULAR | Status: DC
  Administered 2015-05-28 – 2015-05-30 (×7): 60 mg via INTRAVENOUS
  Filled 2015-05-28 (×7): qty 2

## 2015-05-28 MED ORDER — OMEGA-3-ACID ETHYL ESTERS 1 G PO CAPS
1.0000 g | ORAL_CAPSULE | Freq: Every day | ORAL | Status: DC
  Administered 2015-05-28 – 2015-06-02 (×6): 1 g via ORAL
  Filled 2015-05-28 (×7): qty 1

## 2015-05-28 MED ORDER — OSELTAMIVIR PHOSPHATE 75 MG PO CAPS
75.0000 mg | ORAL_CAPSULE | Freq: Two times a day (BID) | ORAL | Status: AC
  Administered 2015-05-28 – 2015-06-01 (×10): 75 mg via ORAL
  Filled 2015-05-28 (×14): qty 1

## 2015-05-28 MED ORDER — DEXTROSE 5 % IV SOLN: 2.0000 g | Freq: Two times a day (BID) | INTRAVENOUS | Status: DC

## 2015-05-28 MED ORDER — VANCOMYCIN HCL IN DEXTROSE 1-5 GM/200ML-% IV SOLN: 1000.0000 mg | Freq: Two times a day (BID) | INTRAVENOUS | Status: DC

## 2015-05-28 MED ORDER — VITAMIN D 1000 UNITS PO TABS
2000.0000 [IU] | ORAL_TABLET | Freq: Every day | ORAL | Status: DC
  Administered 2015-05-28 – 2015-06-02 (×6): 2000 [IU] via ORAL
  Filled 2015-05-28 (×6): qty 2

## 2015-05-28 MED ORDER — PANTOPRAZOLE SODIUM 40 MG PO TBEC
40.0000 mg | DELAYED_RELEASE_TABLET | Freq: Every day | ORAL | Status: DC
  Administered 2015-05-28 – 2015-06-02 (×6): 40 mg via ORAL
  Filled 2015-05-28 (×6): qty 1

## 2015-05-28 MED ORDER — IPRATROPIUM BROMIDE 0.02 % IN SOLN
0.2500 mg | RESPIRATORY_TRACT | Status: DC
  Administered 2015-05-28 (×2): 0.25 mg via RESPIRATORY_TRACT
  Filled 2015-05-28 (×2): qty 2.5

## 2015-05-28 MED ORDER — HYDRALAZINE HCL 20 MG/ML IJ SOLN: 5.0000 mg | INTRAMUSCULAR | Status: DC | PRN

## 2015-05-28 MED ORDER — LEVALBUTEROL HCL 1.25 MG/0.5ML IN NEBU
1.2500 mg | INHALATION_SOLUTION | Freq: Four times a day (QID) | RESPIRATORY_TRACT | Status: DC
  Administered 2015-05-28 (×3): 1.25 mg via RESPIRATORY_TRACT
  Filled 2015-05-28 (×5): qty 0.5

## 2015-05-28 MED ORDER — INSULIN ASPART 100 UNIT/ML ~~LOC~~ SOLN
0.0000 [IU] | Freq: Three times a day (TID) | SUBCUTANEOUS | Status: DC
  Administered 2015-05-28: 5 [IU] via SUBCUTANEOUS
  Administered 2015-05-28: 7 [IU] via SUBCUTANEOUS
  Administered 2015-05-28: 3 [IU] via SUBCUTANEOUS
  Administered 2015-05-29: 7 [IU] via SUBCUTANEOUS
  Administered 2015-05-29 (×2): 5 [IU] via SUBCUTANEOUS
  Filled 2015-05-28: qty 1

## 2015-05-28 MED ORDER — ENOXAPARIN SODIUM 40 MG/0.4ML ~~LOC~~ SOLN
40.0000 mg | SUBCUTANEOUS | Status: DC
  Administered 2015-05-28 – 2015-06-01 (×5): 40 mg via SUBCUTANEOUS
  Filled 2015-05-28 (×6): qty 0.4

## 2015-05-28 MED ORDER — IPRATROPIUM BROMIDE 0.02 % IN SOLN
0.2500 mg | Freq: Three times a day (TID) | RESPIRATORY_TRACT | Status: DC
  Administered 2015-05-29: 0.25 mg via RESPIRATORY_TRACT
  Filled 2015-05-28: qty 2.5

## 2015-05-28 NOTE — Progress Notes (Signed)
[facility] PROGRESS NOTE  [name]:[id number] DOB: [birth year] DOA: 05/27/2015 PCP: [name], MD  brief narrative 65 year old male with history of hypertension, diabetes mellitus on insulin and metformin, COPD not on home O2, asthma, GERD, coronary artery disease, hep C, diastolic CHF, arthritis, peripheral vascular disease was hospitalized one month back for healthcare associated pneumonia presented with progressive shortness of breath for the past 3 days associated with subjective fever at home and cough with occasional whitish phlegm. In the ED patient was septic with fever of 102.47F, tachypnea, tachycardia (HR 141) and markedly elevated lactic acid of 6.39 which increased further to 10. Renal function was stable. Blood gas showed pH of 7.27, PCO2 of 29 and PO2 of 78. Chest x-ray was unremarkable. Flu PCR was positive for influenza B. Patient admitted to stepdown for further management.   Assessment/Plan: Severe sepsis with acute hypoxic respiratory failure Appears secondary to influenza B and COPD exacerbation. Admit to stepdown unit. Sepsis pathway initiated in the ED. Received total 4 L bolus in the ED. -Lactic acid slowly trending down but still elevated. -O2 via nasal cannula (patient maintaining sats in the mid 90s on 2 L but has difficulty completing full sentences) -IV Solu-Medrol 80 mg every 8 hours, Xopenex and Atrovent nebs. Started Tamiflu. -Continue maintenance fluid at 1 25 mL per hour. Heart rate has improved with IV resuscitation in the ED. -Started on empiric IV vancomycin and cefepime. Given lack of any underlying pneumonia or bacterial infection I will monitor him off broad-spectrum antibiotics and place him on Levaquin only. -Follow blood culture, urine for strep antigen and Legionella antigen. -Seen by PC CM on consultation. Appreciate recommendations.  Lactic acidosis Suspect this is partly due to sepsis and that patient is on metformin.  Patient does have abdominal bloating and complains of epigastric pain. Lactic acid has been slowly trending down but it still quite elevated. I will check a CT scan of his abdomen and pelvis with and without contrast rule out bowel ischemia. Hold off metformin and monitor serial lactic acid.  COPD with exacerbation Patient reports quitting smoking 2 years back. Uses albuterol inhaler only. Continue Solu-Medrol, scheduled nebs and antitussives. Added Levaquin.  Essential hypertension Holding losartan-HCTZ for now.  Chronic diastolic CHF/CAD status peripheral vascular disease Patient euvolemic. Monitor with aggressive IV hydration. Continue aspirin  Uncontrolled type 2 diabetes mellitus Discontinue metformin. A1c from January 2017 of 10.6. Continue home dose Lantus with sliding scale coverage.  GERD Continue PPI   DVT prophylaxis: Subcutaneous Lovenox Diet: Heart Healthy  Code Status: Full code Family Communication: None at bedside Disposition Plan: Admit to stepdown unit   Consultants:  PC CM  Procedures:  CT abdomen and pelvis (ordered)  Antibiotics:  IV vancomycin and cefepime 1  Levaquin since 3/25  HPI/Subjective: Seen and examined. Complains of cough with epigastric pain and abdominal bloating. Having difficulty completing full sentences.  Objective: Filed Vitals:   05/28/15 0700 05/28/15 0816  BP: 160/81 150/70  Pulse: 110 103  Temp:    Resp: 30 22    Intake/Output Summary (Last 24 hours) at 05/28/15 1012 Last data filed at 05/28/15 0601  Gross per 24 hour  Intake   1250 ml  Output   1500 ml  Net   -250 ml   Filed Weights   05/28/15 0600  Weight: 83 kg (182 lb 15.7 oz)    Exam:   General:  Middle aged male appears dyspneic, unable to complete full sentences  HEENT: No pallor,  moist mucosa, no JVD, supple neck  Cardiovascular: S1 and S2 tachycardic, no murmurs rub and gallop  Respiratory: Tachypnea, fine expiratory wheezing  Abdomen:  Soft, distended, mild epigastric tenderness, bowel sounds present  Musculoskeletal: Warm, no edema  CNS: Alert and oriented  Data Reviewed: Basic Metabolic Panel:  Recent Labs Lab 05/27/15 2315  NA 137  K 3.9  CL 100*  CO2 19*  GLUCOSE 229*  BUN 11  CREATININE 1.09  CALCIUM 8.7*   Liver Function Tests: No results for input(s): AST, ALT, ALKPHOS, BILITOT, PROT, ALBUMIN in the last 168 hours. No results for input(s): LIPASE, AMYLASE in the last 168 hours. No results for input(s): AMMONIA in the last 168 hours. CBC:  Recent Labs Lab 05/27/15 2315  WBC 9.3  NEUTROABS 7.7  HGB 13.4  HCT 41.3  MCV 86.2  PLT 227   Cardiac Enzymes: No results for input(s): CKTOTAL, CKMB, CKMBINDEX, TROPONINI in the last 168 hours. BNP (last 3 results)  Recent Labs  03/08/15 1905 04/26/15 0953 05/28/15 0344  BNP 18.1 56.5 54.9    ProBNP (last 3 results) No results for input(s): PROBNP in the last 8760 hours.  CBG:  Recent Labs Lab 05/27/15 2323 05/28/15 0810  GLUCAP 213* 340*    No results found for this or any previous visit (from the past 240 hour(s)).   Studies: Dg Chest 2 View  05/27/2015  CLINICAL DATA:  65 year old male with shortness of breath EXAM: CHEST  2 VIEW COMPARISON:  Radiograph dated 05/11/2015 FINDINGS: Two views of the chest demonstrate an area of minimal hazy density in the right lower lung field, possibly atelectatic changes. Developing pneumonia is less likely but not excluded. Clinical correlation is recommended. There is no focal consolidation, pleural effusion, or pneumothorax. There is mild eventration of the right hemidiaphragm. The cardiac silhouette is within normal limits. No acute osseous pathology identified. IMPRESSION: Minimal hazy density in the right lung base, likely atelectatic changes. No focal consolidation. Electronically Signed   By: Elgie Collard M.D.   On: 05/27/2015 22:59    Scheduled Meds: . aspirin EC  81 mg Oral Daily  .  cholecalciferol  2,000 Units Oral Daily  . dextromethorphan-guaiFENesin  1 tablet Oral BID  . enoxaparin (LOVENOX) injection  40 mg Subcutaneous Q24H  . insulin aspart  0-9 Units Subcutaneous TID WC  . insulin glargine  50 Units Subcutaneous Daily  . ipratropium  0.25 mg Nebulization Q3H  . levalbuterol  1.25 mg Nebulization 4 times per day  . methylPREDNISolone (SOLU-MEDROL) injection  60 mg Intravenous 3 times per day  . omega-3 acid ethyl esters  1 g Oral Daily  . oseltamivir  75 mg Oral BID  . pantoprazole  40 mg Oral Daily   Continuous Infusions: . sodium chloride 100 mL/hr at 05/28/15 0816     Time spent: 25 minutes    Dawsen Krieger  [facility] Pager [phone]. If 7PM-7AM, please contact night-coverage at [email or web address], password [facility] 05/28/2015, 10:12 AM  LOS: 0 days

## 2015-05-28 NOTE — ED Provider Notes (Signed)
CSN: [id number]     Arrival date & time 05/27/15  2152 History   First MD Initiated Contact with Patient 05/27/15 2204     Chief Complaint  Patient presents with  . Shortness of Breath     (Consider location/radiation/quality/duration/timing/severity/associated sxs/prior Treatment) The history is provided by the patient and medical records. No language interpreter was used.     [name] is a 65 y.o. male  with a hx of HTN, CAD, COPD, asthma, PNA, chronic diastolic CHF presents to the Emergency Department complaining of gradual, persistent, progressively worsening cough and SOB onset 2 days ago. Patient reports he was treated for pneumonia 3 weeks ago.  He was admitted for 2 days and completed his course of medication. He reports that over the last several days he's had to use his albuterol MDI more frequently which helps for only short periods of time. Tonight his shortness of breath worsened to the point that he called EMS. He denies fevers or chills, nausea or vomiting, weakness or dizziness, syncope.  He does report worsening of his cough and reports that he has bilateral rib pain and abdominal pain from his intense coughing.  He reports taking Hycodan without relief.  Patient received 5 mg of albuterol and 1 DuoNeb prior to arrival.   Past Medical History  Diagnosis Date  . Hypertension   . Coronary artery disease   . High cholesterol   . COPD (chronic obstructive pulmonary disease) ([place])   . Shortness of breath dyspnea   . Asthma   . Pneumonia 03/2015  . Type II diabetes mellitus ([place])   . Hepatitis C   . Arthritis     "knees, hands" (03/08/2015)  . Chronic diastolic CHF (congestive heart failure) ([place])     notes 03/08/2015  . Chronic bronchitis [facility])    Past Surgical History  Procedure Laterality Date  . Esophagogastroduodenoscopy  02/09/2011    Procedure: ESOPHAGOGASTRODUODENOSCOPY (EGD);  Surgeon: Missy Sabins, MD;  Location: [facility] ENDOSCOPY;  Service: Endoscopy;  Laterality:  N/A;  . Joint replacement    . Total knee arthroplasty Left 10/22/2009  . Cataract extraction Left 02/2014   Family History  Problem Relation Age of Onset  . Stroke Mother   . Diabetes Mother   . Hypertension Mother   . Hyperlipidemia Mother   . Heart disease Father   . Hypertension Father   . Heart attack Father    Social History  Substance Use Topics  . Smoking status: Former Smoker -- 1.50 packs/day for 46 years    Types: Cigarettes    Quit date: 03/19/2013  . Smokeless tobacco: Never Used  . Alcohol Use: 0.0 oz/week    0 Standard drinks or equivalent per week     Comment: "recovering alcoholic; [id number]"    Review of Systems  Constitutional: Negative for fever, diaphoresis, appetite change, fatigue and unexpected weight change.  HENT: Negative for mouth sores.   Eyes: Negative for visual disturbance.  Respiratory: Positive for cough, chest tightness, shortness of breath and wheezing.   Cardiovascular: Negative for chest pain.  Gastrointestinal: Negative for nausea, vomiting, abdominal pain, diarrhea and constipation.  Endocrine: Negative for polydipsia, polyphagia and polyuria.  Genitourinary: Negative for dysuria, urgency, frequency and hematuria.  Musculoskeletal: Negative for back pain and neck stiffness.  Skin: Negative for rash.  Allergic/Immunologic: Negative for immunocompromised state.  Neurological: Negative for syncope, light-headedness and headaches.  Hematological: Does not bruise/bleed easily.  Psychiatric/Behavioral: Negative for sleep disturbance. The patient is not nervous/anxious.  Allergies  Crestor and Mobic  Home Medications   Prior to Admission medications   Medication Sig Start Date End Date Taking? Authorizing Provider  aspirin EC 81 MG tablet Take 1 tablet (81 mg total) by mouth daily. 08/31/13  Yes Lorayne Marek, MD  cholecalciferol (VITAMIN D) 1000 UNITS tablet Take 1 tablet (1,000 Units total) by mouth daily. Patient taking  differently: Take 2,000 Units by mouth daily.  08/20/12  Yes Robbie Lis, MD  Cinnamon 500 MG capsule Take 500 mg by mouth 2 (two) times daily.   Yes Historical Provider, MD  Cranberry 500 MG CAPS Take 500 mg by mouth daily.    Yes Historical Provider, MD  insulin glargine (LANTUS) 100 UNIT/ML injection Inject 0.7 mLs (70 Units total) into the skin daily. Patient taking differently: Inject 80 Units into the skin every morning.  05/11/15  Yes Renato Shin, MD  losartan-hydrochlorothiazide (HYZAAR) 100-12.5 MG per tablet Take 1 tablet by mouth daily. 09/27/14  Yes Hoyt Koch, MD  metFORMIN (GLUCOPHAGE) 1000 MG tablet TAKE 1 TABLET TWICE DAILY WITH MEALS 12/15/14  Yes Hoyt Koch, MD  Omega-3 Fatty Acids (FISH OIL) 1000 MG CAPS Take 2 capsules by mouth daily.   Yes Historical Provider, MD  omeprazole (PRILOSEC) 40 MG capsule TAKE ONE CAPSULE BY MOUTH DAILY 05/23/15  Yes Hoyt Koch, MD  traZODone (DESYREL) 50 MG tablet TAKE ONE TABLET BY MOUTH AT BEDTIME AS NEEDED FOR SLEEP 05/23/15  Yes Hoyt Koch, MD  Umeclidinium-Vilanterol 62.5-25 MCG/INH AEPB Inhale 1 puff into the lungs daily. 12/15/14  Yes Hoyt Koch, MD  VENTOLIN HFA 108 [phone] Base) MCG/ACT inhaler INHALE TWO PUFFS BY MOUTH EVERY 6 HOURS AS NEEDED FOR WHEEZING FOR SHORTNESS OF BREATH 05/04/15  Yes Hoyt Koch, MD  glucose blood test strip Use as instructed to check blood sugar four times a day. [id number] 12/15/14   Hoyt Koch, MD  guaiFENesin-codeine 100-10 MG/5ML syrup Take 5 mLs by mouth every 6 (six) hours as needed for cough. 03/10/15   Ripudeep Krystal Eaton, MD  Insulin Pen Needle (NOVOFINE) 32G X 6 MM MISC Use to inject insulin 1 time per day. 04/07/15   Renato Shin, MD  Lancets MISC Use to test sugar four times daily. DX E11.09 12/15/14   Hoyt Koch, MD  predniSONE (DELTASONE) 20 MG tablet Take 3 tablets (60 mg total) by mouth daily. Patient not taking: Reported on 05/27/2015  05/11/15   Kristen N Ward, DO   BP 142/68 mmHg  Pulse 141  Temp(Src) 102.5 F (39.2 C) (Rectal)  Resp 24  SpO2 99% Physical Exam  Constitutional: He appears well-developed and well-nourished. No distress.  Awake, alert, nontoxic appearance  HENT:  Head: Normocephalic and atraumatic.  Mouth/Throat: Oropharynx is clear and moist. No oropharyngeal exudate.  Eyes: Conjunctivae are normal. No scleral icterus.  Neck: Normal range of motion. Neck supple.  Cardiovascular: Regular rhythm, normal heart sounds and intact distal pulses.  Tachycardia present.   Pulses:      Radial pulses are 2+ on the right side, and 2+ on the left side.  Pulmonary/Chest: Effort normal and breath sounds normal. No respiratory distress. He has no wheezes.  Equal chest expansion  Abdominal: Soft. Bowel sounds are normal. He exhibits no mass. There is no tenderness. There is no rebound and no guarding.  Musculoskeletal: Normal range of motion. He exhibits no edema.  Neurological: He is alert.  Speech is clear and goal oriented Moves extremities without  ataxia  Skin: Skin is warm and dry. No rash noted. He is not diaphoretic. No erythema.  Warm to the touch  Psychiatric: He has a normal mood and affect.  Nursing note and vitals reviewed.   ED Course  Procedures (including critical care time) Labs Review Labs Reviewed  BASIC METABOLIC PANEL - Abnormal; Notable for the following:    Chloride 100 (*)    CO2 19 (*)    Glucose, Bld 229 (*)    Calcium 8.7 (*)    Anion gap 18 (*)    All other components within normal limits  CBG MONITORING, ED - Abnormal; Notable for the following:    Glucose-Capillary 213 (*)    All other components within normal limits  I-STAT CG4 LACTIC ACID, ED - Abnormal; Notable for the following:    Lactic Acid, Venous 6.39 (*)    All other components within normal limits  CULTURE, BLOOD (ROUTINE X 2)  CULTURE, BLOOD (ROUTINE X 2)  URINE CULTURE  RESPIRATORY VIRUS PANEL  CULTURE,  EXPECTORATED SPUTUM-ASSESSMENT  GRAM STAIN  CBC WITH DIFFERENTIAL/PLATELET  URINALYSIS, ROUTINE W REFLEX MICROSCOPIC (NOT AT [facility])  BRAIN NATRIURETIC PEPTIDE  BLOOD GAS, ARTERIAL  INFLUENZA PANEL BY PCR (TYPE A & B, H1N1)  LACTIC ACID, PLASMA  LACTIC ACID, PLASMA  PROCALCITONIN  PROTIME-INR  APTT  STREP PNEUMONIAE URINARY ANTIGEN  LEGIONELLA PNEUMOPHILA SEROGP 1 UR AG  I-STAT TROPOININ, ED    Imaging Review Dg Chest 2 View  05/27/2015  CLINICAL DATA:  65 year old male with shortness of breath EXAM: CHEST  2 VIEW COMPARISON:  Radiograph dated 05/11/2015 FINDINGS: Two views of the chest demonstrate an area of minimal hazy density in the right lower lung field, possibly atelectatic changes. Developing pneumonia is less likely but not excluded. Clinical correlation is recommended. There is no focal consolidation, pleural effusion, or pneumothorax. There is mild eventration of the right hemidiaphragm. The cardiac silhouette is within normal limits. No acute osseous pathology identified. IMPRESSION: Minimal hazy density in the right lung base, likely atelectatic changes. No focal consolidation. Electronically Signed   By: Anner Crete M.D.   On: 05/27/2015 22:59   I have personally reviewed and evaluated these images and lab results as part of my medical decision-making.   EKG Interpretation   Date/Time:  Friday May 27 2015 22:22:14 EDT Ventricular Rate:  128 PR Interval:  131 QRS Duration: 84 QT Interval:  317 QTC Calculation: 463 R Axis:   66 Text Interpretation:  Sinus tachycardia Confirmed by Alvino Chapel  MD, Ovid Curd  [phone]) on 05/27/2015 10:42:54 PM      MDM   Final diagnoses:  Elevated lactic acid level  HCAP (healthcare-associated pneumonia)  Sepsis, due to unspecified organism [facility])  Essential hypertension, benign  COPD exacerbation ([place])   [name] presents with cough, congestion shortness of breath.  He is tachycardic on arrival and without hypoxia using  supplemental oxygen.   He is tachypneic. Afebrile on arrival.  Rectal temperature checked and found to be 102.5. Code sepsis called.  Discussed elevated at 6.39. A normal troponin. No leukocytosis. Anion gap of 18 with elevated glucose of 229.  Patient given continuous nebulizer with some improvement. Chest x-ray with hazy density in the right lung base but no focal consolidation. Concern for age As patient was admitted 3 weeks ago. He is receiving fluids, 30 mL/kg and antibiotics. Blood cultures have been sent.  Will need admission.  The patient was discussed with and seen by Dr. Alvino Chapel who agrees  with the treatment plan.   Discussed with Dr. Blaine Hamper who will admit to stepdown.   Jarrett Soho Veasna Santibanez, PA-C 05/28/15 0131  Davonna Belling, MD 05/28/15 2207

## 2015-05-28 NOTE — Consult Note (Addendum)
PULMONARY / CRITICAL CARE MEDICINE   Name: [name] MRN: [id number] DOB: 12/05/1950    ADMISSION DATE:  05/27/2015 CONSULTATION DATE:  05/28/2015  REFERRING MD:  [facility] team  CHIEF COMPLAINT:  Elevated lactic acid  HISTORY OF PRESENT ILLNESS:   This is a pleasant 65 year old male who quit smoking 2 years ago and has a history of COPD and hepatitis C who presented to the emergency department on 05/27/2015 with a complaint of 3 days of shortness of breath, cough, and nonbloody mucus production. He states that he has been in and out of the hospital several times in the last several months with various respiratory problems. However, he recovered after the most recent episode for healthcare associated pneumonia fairly well. 3 days ago he started having cough, wheezing, fever, and chills. He says that he has not had any pain in his belly with the exception of some mild soreness when he coughs. He denies significant dysuria though he states that he has a mild amount of stinging when he urinates. He denies any diarrhea or blood loss.  In the emergency department he was noted to be febrile with quite significant tachycardia and so he was treated with broad-spectrum antibiotics and IV fluids.   His influenza test was positive for flu B. At this point his tachycardia has significantly improved and his breathing has improved though he states that he still is quite not back to baseline.  Pulmonary and critical care medicine was consulted for a remarkably elevated lactic acid. He tells me that he takes metformin regularly. And has been taking it this week.   PAST MEDICAL HISTORY :  He  has a past medical history of Hypertension; Coronary artery disease; High cholesterol; COPD (chronic obstructive pulmonary disease) ([place]); Shortness of breath dyspnea; Asthma; Pneumonia (03/2015); Type II diabetes mellitus ([place]); Hepatitis C; Arthritis; Chronic diastolic CHF (congestive heart failure) ([place]); and  Chronic bronchitis ([place]).  PAST SURGICAL HISTORY: He  has past surgical history that includes Esophagogastroduodenoscopy (02/09/2011); Joint replacement; Total knee arthroplasty (Left, 10/22/2009); and Cataract extraction (Left, 02/2014).  Allergies  Allergen Reactions  . Crestor [Rosuvastatin] Other (See Comments)    INTOLERANCE   . Mobic [Meloxicam] Rash    No current facility-administered medications on file prior to encounter.   Current Outpatient Prescriptions on File Prior to Encounter  Medication Sig  . aspirin EC 81 MG tablet Take 1 tablet (81 mg total) by mouth daily.  . cholecalciferol (VITAMIN D) 1000 UNITS tablet Take 1 tablet (1,000 Units total) by mouth daily. (Patient taking differently: Take 2,000 Units by mouth daily. )  . Cinnamon 500 MG capsule Take 500 mg by mouth 2 (two) times daily.  . Cranberry 500 MG CAPS Take 500 mg by mouth daily.   . insulin glargine (LANTUS) 100 UNIT/ML injection Inject 0.7 mLs (70 Units total) into the skin daily. (Patient taking differently: Inject 80 Units into the skin every morning. )  . losartan-hydrochlorothiazide (HYZAAR) 100-12.5 MG per tablet Take 1 tablet by mouth daily.  . metFORMIN (GLUCOPHAGE) 1000 MG tablet TAKE 1 TABLET TWICE DAILY WITH MEALS  . Omega-3 Fatty Acids (FISH OIL) 1000 MG CAPS Take 2 capsules by mouth daily.  [name] omeprazole (PRILOSEC) 40 MG capsule TAKE ONE CAPSULE BY MOUTH DAILY  . traZODone (DESYREL) 50 MG tablet TAKE ONE TABLET BY MOUTH AT BEDTIME AS NEEDED FOR SLEEP  . Umeclidinium-Vilanterol 62.5-25 MCG/INH AEPB Inhale 1 puff into the lungs daily.  . VENTOLIN HFA 108 (90 Base) MCG/ACT  inhaler INHALE TWO PUFFS BY MOUTH EVERY 6 HOURS AS NEEDED FOR WHEEZING FOR SHORTNESS OF BREATH  . glucose blood test strip Use as instructed to check blood sugar four times a day. DXE11.09  . guaiFENesin-codeine 100-10 MG/5ML syrup Take 5 mLs by mouth every 6 (six) hours as needed for cough.  . Insulin Pen Needle (NOVOFINE) 32G X 6  MM MISC Use to inject insulin 1 time per day.  . Lancets MISC Use to test sugar four times daily. DX E11.09  . predniSONE (DELTASONE) 20 MG tablet Take 3 tablets (60 mg total) by mouth daily. (Patient not taking: Reported on 05/27/2015)    FAMILY HISTORY:  His indicated that his mother is deceased. He indicated that his father is deceased.   SOCIAL HISTORY: He  reports that he quit smoking about 2 years ago. His smoking use included Cigarettes. He has a 69 pack-year smoking history. He has never used smokeless tobacco. He reports that he drinks alcohol. He reports that he does not use illicit drugs.  REVIEW OF SYSTEMS:   Gen: + fever, chills, weight change, fatigue, night sweats HEENT: Denies blurred vision, double vision, hearing loss, tinnitus, sinus congestion, rhinorrhea, sore throat, neck stiffness, dysphagia PULM: Per HPI CV: Denies chest pain, edema, orthopnea, paroxysmal nocturnal dyspnea, palpitations GI: Denies abdominal pain, nausea, vomiting, diarrhea, hematochezia, melena, constipation, change in bowel habits GU: Notes some dysuria, denies hematuria, polyuria, oliguria, urethral discharge Endocrine: Denies hot or cold intolerance, polyuria, polyphagia or appetite change Derm: Denies rash, dry skin, scaling or peeling skin change Heme: Denies easy bruising, bleeding, bleeding gums Neuro: Denies headache, numbness, weakness, slurred speech, loss of memory or consciousness   SUBJECTIVE:  As above   VITAL SIGNS: BP 136/72 mmHg  Pulse 97  Temp(Src) 102.5 F (39.2 C) (Rectal)  Resp 27  Ht '5\' 6"'  (1.676 m)  Wt 83 kg (182 lb 15.7 oz)  BMI 29.55 kg/m2  SpO2 99%  HEMODYNAMICS:    VENTILATOR SETTINGS:    INTAKE / OUTPUT: I/O last 3 completed shifts: In: 1250 [IV Piggyback:1250] Out: 1500 [Urine:1500]  PHYSICAL EXAMINATION: General:  Sitting up on side of bed eating breakfast Neuro:  Awake, alert, oriented x4, MAEW HEENT:  NCAT, OP clear Cardiovascular:  Tachy,  regular, no mgr Lungs:  Decreased air movement, minimal wheezing Abdomen:  Belly soft, nontender, bowel sounds normally active Musculoskeletal:  Normal bulk and tone Skin:  Chronic appearing punctate scarring vs rash left arm  LABS:  BMET  Recent Labs Lab 05/27/15 2315  NA 137  K 3.9  CL 100*  CO2 19*  BUN 11  CREATININE 1.09  GLUCOSE 229*    Electrolytes  Recent Labs Lab 05/27/15 2315  CALCIUM 8.7*    CBC  Recent Labs Lab 05/27/15 2315  WBC 9.3  HGB 13.4  HCT 41.3  PLT 227    Coag's  Recent Labs Lab 05/27/15 0154  APTT 34  INR 1.08    Sepsis Markers  Recent Labs Lab 05/27/15 0154 05/28/15 0010 05/28/15 0205 05/28/15 0355  LATICACIDVEN  --  6.39* 10.4* 8.7*  PROCALCITON 0.58  --   --   --     ABG  Recent Labs Lab 05/28/15 0142  PHART 7.245*  PCO2ART 31.8*  PO2ART 90.0    Liver Enzymes No results for input(s): AST, ALT, ALKPHOS, BILITOT, ALBUMIN in the last 168 hours.  Cardiac Enzymes No results for input(s): TROPONINI, PROBNP in the last 168 hours.  Glucose  Recent Labs Lab 05/27/15  [place]*    Imaging Dg Chest 2 View  05/27/2015  CLINICAL DATA:  65 year old male with shortness of breath EXAM: CHEST  2 VIEW COMPARISON:  Radiograph dated 05/11/2015 FINDINGS: Two views of the chest demonstrate an area of minimal hazy density in the right lower lung field, possibly atelectatic changes. Developing pneumonia is less likely but not excluded. Clinical correlation is recommended. There is no focal consolidation, pleural effusion, or pneumothorax. There is mild eventration of the right hemidiaphragm. The cardiac silhouette is within normal limits. No acute osseous pathology identified. IMPRESSION: Minimal hazy density in the right lung base, likely atelectatic changes. No focal consolidation. Electronically Signed   By: Anner Crete M.D.   On: 05/27/2015 22:59     STUDIES:  CXR images personally reviewed> no infiltrate,  improved compared to February films  CULTURES: 3/24 blood 3/24 resp viral panel 3/25 urine  3/24 rapid flu > positive Flu B  ANTIBIOTICS: 3/24 Vanc >  3/24 Cefepime >   SIGNIFICANT EVENTS:   LINES/TUBES:   DISCUSSION: 65 y/o male with a COPD exacerbation secondary to influenza here with a remarkably elevated lactic acid.  I agree that on admission he met SIRS criteria and appeared septic, but truly his symptoms are best explained by the influenza and an exacerbation of COPD.  So the rising lactic acid despite improving HR and normal blood pressure does not seem to be explained by shock.  One must consider so-called "B-type" causes of lactic acidosis and in his situation I worry that the metformin is causing this.  Some feel that metformin is contra-indicated in patients with COPD, see product label.    ASSESSMENT / PLAN:  PULMONARY A: COPD exacerbation P:   Continue treatment as you are doing  CARDIOVASCULAR A:  Lactic acidosis P:  Would repeat lactic acid If the lactic acid is still rising, would check CT abdomen to rule out ischemic bowel though his history does not support this Stop metformin, don't restart  INFECTIOUS A:   Influenza B Doubt sepsis as there is no evidence of a bacterial infection P:   Respiratory isolation Would stop broad spectrum antibiotics to prevent c diff Agree with Tamiflu  PCCM will be available PRN if his condition changes   Roselie Awkward, MD [place] P[facility]M Pager: [phone] Cell: [phone] After 3pm or if no response, call [phone]   05/28/2015, 7:14 AM

## 2015-05-28 NOTE — Progress Notes (Addendum)
Pharmacy Antibiotic Note  [name] is a 65 y.o. male admitted on 05/27/2015 with HCAP. Pt was discharged 2/22 after admission for PNA - discharged with Levaquin. Pt states he completed course of abx. Code sepsis called. Pharmacy has been consulted for Vancomycin and Cefepime dosing. Estimated CrCl 70 ml/min.  Pt received Cefepime 2gm and Vanc 1gm in ED ~0030.  Plan: Cefepime 2gm IV q12h Vancomycin 1gm IV q12h Will f/u micro data, renal function, and pt's clinical condition Vanc trough prn      Temp (24hrs), Avg:101.1 F (38.4 C), Min:99.6 F (37.6 C), Max:102.5 F (39.2 C)   Recent Labs Lab 05/27/15 2315 05/28/15 0010  WBC 9.3  --   CREATININE 1.09  --   LATICACIDVEN  --  6.39*    CrCl cannot be calculated (Unknown ideal weight.).    Allergies  Allergen Reactions  . Crestor [Rosuvastatin] Other (See Comments)    INTOLERANCE   . Mobic [Meloxicam] Rash    Antimicrobials this admission: 3/25 Vanc >>  3/25 Cefepime >>   Dose adjustments this admission: n/a  Microbiology results:  BCx:   UCx:    Sputum:    RSV:   Thank you for allowing pharmacy to be a part of this patient's care.    Sherlon Handing, PharmD, BCPS Clinical pharmacist, pager [phone] 05/28/2015 1:27 AM     Pharmacy Code Sepsis Protocol  Time of code sepsis page: 3/24 2354 [x]  RN pulled abx from pyxis right after code called  Were antibiotics ordered at the time of the code sepsis page? Yes Was it required to contact the physician? [x]  Physician not contacted []  Physician contacted to order antibiotics for code sepsis []  Physician contacted to recommend changing antibiotics  Pharmacy consulted for: Vancomycin and Cefepime  Anti-infectives    Start     Dose/Rate Route Frequency Ordered Stop   05/27/15 2345  ceFEPIme (MAXIPIME) 2 g in dextrose 5 % 50 mL IVPB     2 g 100 mL/hr over 30 Minutes Intravenous  Once 05/27/15 2344     05/27/15 2345  vancomycin (VANCOCIN) IVPB 1000 mg/200  mL premix     1,000 mg 200 mL/hr over 60 Minutes Intravenous  Once 05/27/15 2344          Nurse education provided -  None needed   Sherlon Handing [place],05/28/2015, 12:32 AM

## 2015-05-28 NOTE — ED Notes (Signed)
CBG- 340 

## 2015-05-28 NOTE — ED Notes (Signed)
MD at bedside. 

## 2015-05-28 NOTE — Evaluation (Signed)
Physical Therapy Evaluation Patient Details Name: [name] MRN: [id number] DOB: 03/27/1950 Today's Date: 05/28/2015   History of Present Illness  Patient is a 65 yo male admitted 05/27/15 with acute on chronic resp failure with hypoxia due to COPD exacerbation, sepsis, Influenza B.   PMH:  HTN, DM, COPD, asthma, CAD, Afib, CHF, arthritis, PVD, Hep C  Clinical Impression  Patient is functioning at Mod I to supervision with all mobility and gait.  Good balance with gait.  No acute PT needs identified - PT will sign off.  Encouraged patient to ambulate in hallway with nursing assist.    Follow Up Recommendations No PT follow up;Supervision - Intermittent    Equipment Recommendations  None recommended by PT    Recommendations for Other Services       Precautions / Restrictions Precautions Precautions: None Restrictions Weight Bearing Restrictions: No      Mobility  Bed Mobility Overal bed mobility: Modified Independent             General bed mobility comments: Increased time and use of rail.  Transfers Overall transfer level: Modified independent Equipment used: None             General transfer comment: Good balance in stance.  Ambulation/Gait Ambulation/Gait assistance: Supervision Ambulation Distance (Feet): 40 Feet Assistive device: None Gait Pattern/deviations: Step-through pattern;Decreased stride length Gait velocity: decreased Gait velocity interpretation: Below normal speed for age/gender General Gait Details: Patient with good gait pattern and balance.  Noted HR increase 96 to 127, and resp rate increased 22 to 31.  O2 sats remained in 90's during gait on room air.  Stairs            Wheelchair Mobility    Modified Rankin (Stroke Patients Only)       Balance Overall balance assessment: Modified Independent                                           Pertinent Vitals/Pain Pain Assessment: No/denies pain (Pain with  cough in abdomen and chest.)    Home Living Family/patient expects to be discharged to:: Private residence Living Arrangements: Alone Available Help at Discharge: Family;Available PRN/intermittently Type of Home: House Home Access: Level entry     Home Layout: One level Home Equipment: Walker - 2 wheels;Cane - single point;Bedside commode      Prior Function Level of Independence: Independent         Comments: [name] for transportation     Hand Dominance   Dominant Hand: Right    Extremity/Trunk Assessment   Upper Extremity Assessment: Overall WFL for tasks assessed           Lower Extremity Assessment: Overall WFL for tasks assessed      Cervical / Trunk Assessment: Normal  Communication   Communication: No difficulties  Cognition Arousal/Alertness: Awake/alert Behavior During Therapy: WFL for tasks assessed/performed Overall Cognitive Status: Within Functional Limits for tasks assessed                      General Comments      Exercises        Assessment/Plan    PT Assessment Patent does not need any further PT services  PT Diagnosis Difficulty walking;Generalized weakness   PT Problem List    PT Treatment Interventions     PT Goals (Current goals  can be found in the Care Plan section) Acute Rehab PT Goals PT Goal Formulation: All assessment and education complete, DC therapy    Frequency     Barriers to discharge        Co-evaluation               End of Session Equipment Utilized During Treatment: Gait belt Activity Tolerance: Patient tolerated treatment well;Patient limited by fatigue Patient left: in bed;with call bell/phone within reach Nurse Communication: Mobility status         Time: [id number] PT Time Calculation (min) (ACUTE ONLY): 12 min   Charges:   PT Evaluation $PT Eval Moderate Complexity: 1 Procedure     PT G CodesDespina Owens 06-12-2015, 5:01 PM [name]. [name],  [name] Pager [phone]

## 2015-05-28 NOTE — H&P (Addendum)
[facility] History and Physical  CARBON TREADWAY [id number] DOB: 08/11/1950 DOA: 05/27/2015  Referring physician: ED physician PCP: Hoyt Koch, MD  Specialists:   Chief Complaint: Shortness of breath and fever  HPI: DEE STEENO is a 65 y.o. male with PMH of hypertension, diabetes mellitus, COPD, asthma, GERD, CAD, HCV, diastolic congestive heart failure, arthritis, PVD, history of intubation, who presents with shortness of breath.  Patient was recently hospitalized from 2/20-2/22 and treated for HCAP. He completed antibiotics after discharge and has been doing fine until yesterday when he started having shortness of breath again. He also has mild cough with little white sputum production. He has front chest pain which is induced by coughing per patient. Patient has fever and chills. No runny nose or sore throat. He also reports having dysuria, burning and increased urinary frequency. Patient does not have nausea, vomiting, abdominal pain, diarrhea, unilateral weakness.  In ED, patient was found to have lactate 6.39, WBC 9.3, tachycardia with heart rate up to 141, temperature 102.5, tachypnea, negative troponin, electrolytes and renal function okay. ABG showed pH of 7.276, PCO2 29 and PO2 78. Chest x-ray has no consolidation. Patient is admitted to inpatient for further eval and treatment.  EKG: Independently reviewed. Tachycardia, QTC 463  Where does patient live?   At home   Can patient participate in ADLs? Some   Review of Systems:   General: has fevers, chills, no changes in body weight, has poor appetite, has fatigue HEENT: no blurry vision, hearing changes or sore throat Pulm: has dyspnea, coughing, wheezing CV: has chest pain, no palpitations Abd: no nausea, vomiting, abdominal pain, diarrhea, constipation GU: no dysuria, burning on urination, increased urinary frequency, hematuria  Ext: no leg edema Neuro: no unilateral weakness, numbness, or tingling, no  vision change or hearing loss Skin: no rash MSK: No muscle spasm, no deformity, no limitation of range of movement in spin Heme: No easy bruising.  Travel history: No recent long distant travel.  Allergy:  Allergies  Allergen Reactions  . Crestor [Rosuvastatin] Other (See Comments)    INTOLERANCE   . Mobic [Meloxicam] Rash    Past Medical History  Diagnosis Date  . Hypertension   . Coronary artery disease   . High cholesterol   . COPD (chronic obstructive pulmonary disease) ([place])   . Shortness of breath dyspnea   . Asthma   . Pneumonia 03/2015  . Type II diabetes mellitus ([place])   . Hepatitis C   . Arthritis     "knees, hands" (03/08/2015)  . Chronic diastolic CHF (congestive heart failure) ([place])     notes 03/08/2015  . Chronic bronchitis [facility])     Past Surgical History  Procedure Laterality Date  . Esophagogastroduodenoscopy  02/09/2011    Procedure: ESOPHAGOGASTRODUODENOSCOPY (EGD);  Surgeon: Missy Sabins, MD;  Location: [facility] ENDOSCOPY;  Service: Endoscopy;  Laterality: N/A;  . Joint replacement    . Total knee arthroplasty Left 10/22/2009  . Cataract extraction Left 02/2014    Social History:  reports that he quit smoking about 2 years ago. His smoking use included Cigarettes. He has a 69 pack-year smoking history. He has never used smokeless tobacco. He reports that he drinks alcohol. He reports that he does not use illicit drugs.  Family History:  Family History  Problem Relation Age of Onset  . Stroke Mother   . Diabetes Mother   . Hypertension Mother   . Hyperlipidemia Mother   . Heart disease Father   .  Hypertension Father   . Heart attack Father      Prior to Admission medications   Medication Sig Start Date End Date Taking? Authorizing Provider  aspirin EC 81 MG tablet Take 1 tablet (81 mg total) by mouth daily. 08/31/13  Yes Lorayne Marek, MD  cholecalciferol (VITAMIN D) 1000 UNITS tablet Take 1 tablet (1,000 Units total) by mouth daily. Patient taking  differently: Take 2,000 Units by mouth daily.  08/20/12  Yes Robbie Lis, MD  Cinnamon 500 MG capsule Take 500 mg by mouth 2 (two) times daily.   Yes Historical Provider, MD  Cranberry 500 MG CAPS Take 500 mg by mouth daily.    Yes Historical Provider, MD  insulin glargine (LANTUS) 100 UNIT/ML injection Inject 0.7 mLs (70 Units total) into the skin daily. Patient taking differently: Inject 80 Units into the skin every morning.  05/11/15  Yes Renato Shin, MD  losartan-hydrochlorothiazide (HYZAAR) 100-12.5 MG per tablet Take 1 tablet by mouth daily. 09/27/14  Yes Hoyt Koch, MD  metFORMIN (GLUCOPHAGE) 1000 MG tablet TAKE 1 TABLET TWICE DAILY WITH MEALS 12/15/14  Yes Hoyt Koch, MD  Omega-3 Fatty Acids (FISH OIL) 1000 MG CAPS Take 2 capsules by mouth daily.   Yes Historical Provider, MD  omeprazole (PRILOSEC) 40 MG capsule TAKE ONE CAPSULE BY MOUTH DAILY 05/23/15  Yes Hoyt Koch, MD  traZODone (DESYREL) 50 MG tablet TAKE ONE TABLET BY MOUTH AT BEDTIME AS NEEDED FOR SLEEP 05/23/15  Yes Hoyt Koch, MD  Umeclidinium-Vilanterol 62.5-25 MCG/INH AEPB Inhale 1 puff into the lungs daily. 12/15/14  Yes Hoyt Koch, MD  VENTOLIN HFA 108 [id number] Base) MCG/ACT inhaler INHALE TWO PUFFS BY MOUTH EVERY 6 HOURS AS NEEDED FOR WHEEZING FOR SHORTNESS OF BREATH 05/04/15  Yes Hoyt Koch, MD  glucose blood test strip Use as instructed to check blood sugar four times a day. [id number] 12/15/14   Hoyt Koch, MD  guaiFENesin-codeine 100-10 MG/5ML syrup Take 5 mLs by mouth every 6 (six) hours as needed for cough. 03/10/15   Ripudeep Krystal Eaton, MD  Insulin Pen Needle (NOVOFINE) 32G X 6 MM MISC Use to inject insulin 1 time per day. 04/07/15   Renato Shin, MD  Lancets MISC Use to test sugar four times daily. DX E11.09 12/15/14   Hoyt Koch, MD  predniSONE (DELTASONE) 20 MG tablet Take 3 tablets (60 mg total) by mouth daily. Patient not taking: Reported on 05/27/2015  05/11/15   Delice Bison Ward, DO    Physical Exam: Filed Vitals:   05/28/15 0030 05/28/15 0045 05/28/15 0100 05/28/15 0130  BP: 123/67 137/57 119/60 126/55  Pulse: 132 131 125 123  Temp:      TempSrc:      Resp: 26  25   SpO2: 100% 98% 96% 96%   General: Not in acute distress HEENT:       Eyes: PERRL, EOMI, no scleral icterus.       ENT: No discharge from the ears and nose, no pharynx injection, no tonsillar enlargement.        Neck: No JVD, no bruit, no mass felt. Heme: No neck lymph node enlargement. Cardiac: S1/S2, RRR, No murmurs, No gallops or rubs. Pulm: has rales and wheezing bilaterally. Abd: Soft, nondistended, nontender, no rebound pain, no organomegaly, BS present. Ext: No pitting leg edema bilaterally. 2+DP/PT pulse bilaterally. Musculoskeletal: No joint deformities, No joint redness or warmth, no limitation of ROM in spin. Skin: No rashes.  Neuro:  Alert, oriented X3, cranial nerves II-XII grossly intact, moves all extremities normally.  Psych: Patient is not psychotic, no suicidal or hemocidal ideation.  Labs on Admission:  Basic Metabolic Panel:  Recent Labs Lab 05/27/15 2315  NA 137  K 3.9  CL 100*  CO2 19*  GLUCOSE 229*  BUN 11  CREATININE 1.09  CALCIUM 8.7*   Liver Function Tests: No results for input(s): AST, ALT, ALKPHOS, BILITOT, PROT, ALBUMIN in the last 168 hours. No results for input(s): LIPASE, AMYLASE in the last 168 hours. No results for input(s): AMMONIA in the last 168 hours. CBC:  Recent Labs Lab 05/27/15 2315  WBC 9.3  NEUTROABS 7.7  HGB 13.4  HCT 41.3  MCV 86.2  PLT 227   Cardiac Enzymes: No results for input(s): CKTOTAL, CKMB, CKMBINDEX, TROPONINI in the last 168 hours.  BNP (last 3 results)  Recent Labs  02/17/15 1330 03/08/15 1905 04/26/15 0953  BNP 21.5 18.1 56.5    ProBNP (last 3 results) No results for input(s): PROBNP in the last 8760 hours.  CBG:  Recent Labs Lab 05/27/15 2323  GLUCAP 213*     Radiological Exams on Admission: Dg Chest 2 View  05/27/2015  CLINICAL DATA:  65 year old male with shortness of breath EXAM: CHEST  2 VIEW COMPARISON:  Radiograph dated 05/11/2015 FINDINGS: Two views of the chest demonstrate an area of minimal hazy density in the right lower lung field, possibly atelectatic changes. Developing pneumonia is less likely but not excluded. Clinical correlation is recommended. There is no focal consolidation, pleural effusion, or pneumothorax. There is mild eventration of the right hemidiaphragm. The cardiac silhouette is within normal limits. No acute osseous pathology identified. IMPRESSION: Minimal hazy density in the right lung base, likely atelectatic changes. No focal consolidation. Electronically Signed   By: Anner Crete M.D.   On: 05/27/2015 22:59    Assessment/Plan Principal Problem:   Acute on chronic respiratory failure with hypoxia (HCC) Active Problems:   Essential hypertension, benign   GERD (gastroesophageal reflux disease)   Chronic diastolic CHF (congestive heart failure) (HCC)   Insulin dependent type 2 diabetes mellitus ([place])   HCAP (healthcare-associated pneumonia)   Sepsis ([place])   COPD exacerbation (HCC)   Acute on chronic respiratory failure with hypoxia due to COPD exacerbation and sepsis: Patient's shortness of breath, fever, chills and productive cough are consistent with COPD exacerbation. Chest x-ray has no consolidation. He is severe septic with elevated lactate 6.39, fever, tachycardia and tachypnea. Hemodynamically stable currently.  -will admit patient to SDU -Nebulizers: scheduled Atrovent q3h and prn xopenex -Solu-Medrol 60 mg IV tid -IV vanco and cefepime (patient needs antibiotics with broad coverage due to recent admission for HCAP and severe sepsis) -Mucinex for cough  -Urine S. pneumococcal and Legionella antigen -Follow up blood culture x2, sputum culture, respiratory virus panel, Flu pcr -will get  Procalcitonin and trend lactic acid levels per sepsis protocol. -IVF: 3.0L of NS bolus in ED, followed by 100 cc/h   Addendum: flu pcr comes back positive for Flu B. Her lactate is 6.39-->10.4-->8.7. -will start Tamiflu -Given another liter of normal saline bolus to add up to total of 4 L normal saline bolus. -PCCM, Dr. Corinna Lines was consulted, will see.  HTN; -Hold losartan-HCTZ since patient is at risk of developing hypotension due to severe sepsis -IV hydralazine when necessary  GERD: -Protonix  Chronic diastolic CHF (congestive heart failure) ([place]): 2-D echo on 03/12/13 showed EF 60-65% with grade 1 diastolic dysfunction. Patient does not have  leg edema. CHF is compensated on admission. Patient has not taken diuretics at home. -Continue aspirin -Check BNP  DM-II: Last A1c 10.6 on 03/08/15, poorly controled. Patient is taking metformin and Lantus at home -will decrease Lantus dose from 80-50 units daily  -SSI  DVT ppx: SQ Lovenox  Code Status: Full code Family Communication: None at bed side.   Disposition Plan: Admit to inpatient   Date of Service 05/28/2015    Ivor Costa [facility] Pager [phone]  If 7PM-7AM, please contact night-coverage [email or web address] Password [email or web address] 05/28/2015, 3:03 AM

## 2015-05-29 DIAGNOSIS — R05 Cough: Secondary | ICD-10-CM

## 2015-05-29 DIAGNOSIS — E872 Acidosis, unspecified: Secondary | ICD-10-CM | POA: Diagnosis present

## 2015-05-29 DIAGNOSIS — R7989 Other specified abnormal findings of blood chemistry: Secondary | ICD-10-CM | POA: Insufficient documentation

## 2015-05-29 DIAGNOSIS — J111 Influenza due to unidentified influenza virus with other respiratory manifestations: Secondary | ICD-10-CM

## 2015-05-29 DIAGNOSIS — J101 Influenza due to other identified influenza virus with other respiratory manifestations: Secondary | ICD-10-CM | POA: Diagnosis present

## 2015-05-29 LAB — GLUCOSE, CAPILLARY
GLUCOSE-CAPILLARY: 275 mg/dL — AB (ref 65–99)
GLUCOSE-CAPILLARY: 291 mg/dL — AB (ref 65–99)
Glucose-Capillary: 314 mg/dL — ABNORMAL HIGH (ref 65–99)
Glucose-Capillary: 256 mg/dL — ABNORMAL HIGH (ref 65–99)

## 2015-05-29 LAB — LACTIC ACID, PLASMA
LACTIC ACID, VENOUS: 2.1 mmol/L — AB (ref 0.5–2.0)
LACTIC ACID, VENOUS: 2.4 mmol/L — AB (ref 0.5–2.0)
LACTIC ACID, VENOUS: 2.4 mmol/L — AB (ref 0.5–2.0)

## 2015-05-29 LAB — URINE CULTURE: Culture: 5000

## 2015-05-29 MED ORDER — BENZONATATE 100 MG PO CAPS
100.0000 mg | ORAL_CAPSULE | Freq: Three times a day (TID) | ORAL | Status: DC | PRN
  Administered 2015-05-29 – 2015-05-30 (×2): 100 mg via ORAL
  Filled 2015-05-29 (×2): qty 1

## 2015-05-29 MED ORDER — HYDROCOD POLST-CPM POLST ER 10-8 MG/5ML PO SUER
5.0000 mL | Freq: Two times a day (BID) | ORAL | Status: DC
  Administered 2015-05-29 – 2015-05-30 (×3): 5 mL via ORAL
  Filled 2015-05-29 (×3): qty 5

## 2015-05-29 MED ORDER — ALBUTEROL SULFATE (2.5 MG/3ML) 0.083% IN NEBU: 2.5000 mg | INHALATION_SOLUTION | RESPIRATORY_TRACT | Status: DC | PRN

## 2015-05-29 MED ORDER — INSULIN ASPART 100 UNIT/ML ~~LOC~~ SOLN
0.0000 [IU] | Freq: Three times a day (TID) | SUBCUTANEOUS | Status: DC
  Administered 2015-05-30: 9 [IU] via SUBCUTANEOUS
  Administered 2015-05-30 – 2015-05-31 (×4): 3 [IU] via SUBCUTANEOUS
  Administered 2015-05-31: 5 [IU] via SUBCUTANEOUS
  Administered 2015-06-01: 2 [IU] via SUBCUTANEOUS
  Administered 2015-06-01: 6 [IU] via SUBCUTANEOUS
  Administered 2015-06-02: 1 [IU] via SUBCUTANEOUS

## 2015-05-29 MED ORDER — INSULIN ASPART 100 UNIT/ML ~~LOC~~ SOLN
0.0000 [IU] | Freq: Every day | SUBCUTANEOUS | Status: DC
  Administered 2015-05-29: 3 [IU] via SUBCUTANEOUS
  Administered 2015-05-31: 2 [IU] via SUBCUTANEOUS

## 2015-05-29 MED ORDER — IPRATROPIUM-ALBUTEROL 0.5-2.5 (3) MG/3ML IN SOLN
3.0000 mL | RESPIRATORY_TRACT | Status: DC
  Administered 2015-05-29 – 2015-05-30 (×4): 3 mL via RESPIRATORY_TRACT
  Filled 2015-05-29 (×5): qty 3

## 2015-05-29 NOTE — Progress Notes (Signed)
[facility] PROGRESS NOTE  DRURY GAMARRA NFA:[id number] DOB: June 01, 1950 DOA: 05/27/2015 PCP: [name], MD  brief narrative 65 year old male with history of hypertension, diabetes mellitus on insulin and metformin, COPD not on home O2, asthma, GERD, coronary artery disease, hep C, diastolic CHF, arthritis, peripheral vascular disease was hospitalized one month back for healthcare associated pneumonia presented with progressive shortness of breath for the past 3 days associated with subjective fever at home and cough with occasional whitish phlegm. In the ED patient was septic with fever of 102.89F, tachypnea, tachycardia (HR 141) and markedly elevated lactic acid of 6.39 which increased further to 10. Renal function was stable. Blood gas showed pH of 7.27, PCO2 of 29 and PO2 of 78. Chest x-ray was unremarkable. Flu PCR was positive for influenza B. Patient admitted to stepdown for further management.   Assessment/Plan: Severe sepsis with acute hypoxic respiratory failure Appears secondary to influenza B and COPD exacerbation. Sepsis pathway initiated in the ED. Received total 4 L bolus in the followed by maintenance fluid. Lactic acid trending down. -Continue O2 via nasal cannula. -IV Solu-Medrol 80 mg every 8 hours, DuoNeb every 4 hours and albuterol nebs when necessary. Added empiric Levaquin. Continue Tamiflu. -Patient complaining of ongoing cough. Added Tussionex and Tessalon Perles.   Lactic acidosis Suspect this is partly due to sepsis and due to metformin. CT abdomen and pelvis done was unremarkable. Discontinue metformin. Lactic acid now trending down.   COPD with exacerbation Patient reports quitting smoking 2 years back. Uses albuterol inhaler only. Continue Solu-Medrol, scheduled nebs and antitussives. Added Levaquin. -Needs to establish outpatient care with pulmonary and PFT.  Essential hypertension Holding losartan-HCTZ for now.  Chronic diastolic CHF/CAD  status peripheral vascular disease Patient euvolemic. Monitor with aggressive IV hydration. Continue aspirin  Uncontrolled type 2 diabetes mellitus Discontinue metformin. A1c from January 2017 of 10.6. Continue home dose Lantus with sliding scale coverage.  GERD Continue PPI   DVT prophylaxis: Subcutaneous Lovenox Diet: Heart Healthy  Code Status: Full code Family Communication: None at bedside Disposition Plan: Transfer to telemetry.   Consultants:  PC CM  Procedures:  CT abdomen and pelvis (ordered)  Antibiotics:  IV vancomycin and cefepime 1  Levaquin since 3/25  HPI/Subjective: Seen and examined. Complains of ongoing cough with scanty whitish phlegm. Breathing is much better.  Objective: Filed Vitals:   05/29/15 0823 05/29/15 0825  BP: 133/79   Pulse: 92   Temp:  98.3 F (36.8 C)  Resp: 24     Intake/Output Summary (Last 24 hours) at 05/29/15 1132 Last data filed at 05/29/15 0750  Gross per 24 hour  Intake    360 ml  Output   2401 ml  Net  -2041 ml   Filed Weights   05/28/15 0600 05/28/15 1243 05/29/15 0443  Weight: 83 kg (182 lb 15.7 oz) 85.4 kg (188 lb 4.4 oz) 86.546 kg (190 lb 12.8 oz)    Exam:   General:  Middle aged male not in distress and able to speak in full sentences , has ongoing cough  HEENT: , moist mucosa,  supple neck  Cardiovascular: S1 and S2 tachycardic, no murmurs rub and gallop  Respiratory: Diffuse bilateral wheezing, no crackles  Abdomen: Soft, male distended, nontender, bowel sounds present  Musculoskeletal: Warm, no edema    Data Reviewed: Basic Metabolic Panel:  Recent Labs Lab 05/27/15 2315  NA 137  K 3.9  CL 100*  CO2 19*  GLUCOSE 229*  BUN 11  CREATININE 1.09  CALCIUM 8.7*   Liver Function Tests: No results for input(s): AST, ALT, ALKPHOS, BILITOT, PROT, ALBUMIN in the last 168 hours. No results for input(s): LIPASE, AMYLASE in the last 168 hours. No results for input(s): AMMONIA in the last  168 hours. CBC:  Recent Labs Lab 05/27/15 2315  WBC 9.3  NEUTROABS 7.7  HGB 13.4  HCT 41.3  MCV 86.2  PLT 227   Cardiac Enzymes: No results for input(s): CKTOTAL, CKMB, CKMBINDEX, TROPONINI in the last 168 hours. BNP (last 3 results)  Recent Labs  03/08/15 1905 04/26/15 0953 05/28/15 0344  BNP 18.1 56.5 54.9    ProBNP (last 3 results) No results for input(s): PROBNP in the last 8760 hours.  CBG:  Recent Labs Lab 05/28/15 0810 05/28/15 1310 05/28/15 1704 05/28/15 2128 05/29/15 0825  GLUCAP 340* 226* 267* 304* 275*    Recent Results (from the past 240 hour(s))  MRSA PCR Screening     Status: None   Collection Time: 05/28/15 12:54 PM  Result Value Ref Range Status   MRSA by PCR NEGATIVE NEGATIVE Final    Comment:        The GeneXpert MRSA Assay (FDA approved for NASAL specimens only), is one component of a comprehensive MRSA colonization surveillance program. It is not intended to diagnose MRSA infection nor to guide or monitor treatment for MRSA infections.      Studies: Dg Chest 2 View  05/27/2015  CLINICAL DATA:  65 year old male with shortness of breath EXAM: CHEST  2 VIEW COMPARISON:  Radiograph dated 05/11/2015 FINDINGS: Two views of the chest demonstrate an area of minimal hazy density in the right lower lung field, possibly atelectatic changes. Developing pneumonia is less likely but not excluded. Clinical correlation is recommended. There is no focal consolidation, pleural effusion, or pneumothorax. There is mild eventration of the right hemidiaphragm. The cardiac silhouette is within normal limits. No acute osseous pathology identified. IMPRESSION: Minimal hazy density in the right lung base, likely atelectatic changes. No focal consolidation. Electronically Signed   By: Elgie Collard M.D.   On: 05/27/2015 22:59   Ct Angio Abd/pel W/ And/or W/o  05/28/2015  CLINICAL DATA:  65 year old male with abdominal pain and distention. Recent diagnosis of  influenza B EXAM: CTA ABDOMEN AND PELVIS wITHOUT AND WITH CONTRAST TECHNIQUE: Multidetector CT imaging of the abdomen and pelvis was performed using the standard protocol during bolus administration of intravenous contrast. Multiplanar reconstructed images and MIPs were obtained and reviewed to evaluate the vascular anatomy. CONTRAST:  100 mL Isovue 370 COMPARISON:  Prior CT scan of the abdomen and pelvis 07/17/2011 FINDINGS: VASCULAR Aorta: Normal caliber abdominal aorta without evidence of aneurysm or dissection. Peripheral calcified atherosclerotic plaque without significant stenosis or irregularity. Celiac: Widely patent. Conventional hepatic arterial anatomy. No visceral artery aneurysm. SMA: Widely patent and unremarkable. Renals: Single renal arteries bilaterally which are widely patent. No fibromuscular dysplasia. IMA: Widely patent. Inflow: Bulky calcified atherosclerotic plaque results in probable moderate stenosis of the proximal and mid left common iliac artery. High-grade stenosis of the origin of the left internal iliac artery. At least moderate stenosis of the origin of the right internal iliac artery. Bulky calcified plaque continues throughout the external iliac arteries. No significant focal stenosis. Proximal Outflow: At least moderate stenosis of the right common femoral artery. Significant disease noted in the visualized portions of both superficial femoral arteries. Veins: No focal venous abnormality. NON-VASCULAR Lower Chest: The heart is within normal limits for size. No pericardial effusion. Unremarkable distal  thoracic esophagus. Minimal dependent atelectasis. Otherwise the lung bases are clear. Abdomen: Unremarkable CT appearance of the stomach, duodenum, spleen, adrenal glands and pancreas. Abnormal appearance of the liver with relative hypertrophy of the left hepatic lobe, a faintly nodular contour and perhaps mildly hypo attenuating hepatic parenchyma. No discrete hepatic lesions.  Gallbladder is unremarkable. No intra or extrahepatic biliary ductal dilatation. Unremarkable appearance of the bilateral kidneys. No focal solid lesion, hydronephrosis or nephrolithiasis. Tiny low-attenuation lesions bilaterally are too small for accurate characterization but are statistically highly likely benign cysts. No evidence of obstruction or focal bowel wall thickening. Normal appendix in the right lower quadrant. The terminal ileum is unremarkable. No free fluid or suspicious adenopathy. Pelvis: Unremarkable bladder, prostate gland and seminal vesicles. No free fluid or suspicious adenopathy. Bones/Soft Tissues: No acute fracture or aggressive appearing lytic or blastic osseous lesion. Review of the MIP images confirms the above findings. IMPRESSION: VASCULAR 1. No acute vascular abnormality. 2. Widely patent visceral vessels. No evidence of acute or chronic mesenteric ischemia. 3. Significant peripheral arterial disease with at least moderate stenoses of the left common iliac artery, right common femoral artery and bilateral superficial femoral arteries. Does the patient have a clinical history of claudication? NON VASCULAR 1. Suspect early cirrhotic change in the liver as well as mild hypoattenuation concerning for underlying steatosis. Recommend clinical correlation with serum LFTs. 2. No acute abnormality in the abdomen or pelvis. Signed, Sterling Big, MD [facility] Radiology Electronically Signed   By: Malachy Moan M.D.   On: 05/28/2015 12:52    Scheduled Meds: . aspirin EC  81 mg Oral Daily  . chlorpheniramine-HYDROcodone  5 mL Oral Q12H  . cholecalciferol  2,000 Units Oral Daily  . enoxaparin (LOVENOX) injection  40 mg Subcutaneous Q24H  . insulin aspart  0-9 Units Subcutaneous TID WC  . insulin glargine  50 Units Subcutaneous Daily  . ipratropium-albuterol  3 mL Nebulization Q4H  . methylPREDNISolone (SOLU-MEDROL) injection   60 mg Intravenous 3 times per day  . omega-3 acid ethyl esters  1 g Oral Daily  . oseltamivir  75 mg Oral BID  . pantoprazole  40 mg Oral Daily   Continuous Infusions:     Time spent: 35 minutes    Cire Deyarmin  [facility] Pager [phone]. If 7PM-7AM, please contact night-coverage at [email or web address], password [facility] 05/29/2015, 11:32 AM  LOS: 1 day

## 2015-05-29 NOTE — Progress Notes (Signed)
CRITICAL VALUE ALERT  Critical value received:  Lactic Acid 5.8  Date of notification:  05/29/15  Time of notification:  [id number]  Critical value read back:Yes.    Nurse who received alert:  Donnella Bi  MD notified (1st page):  Dr. Clementeen Graham  Time of first page:  1400  Responding MD:  Dr. Clementeen Graham  Time MD responded:  [phone]

## 2015-05-29 NOTE — Progress Notes (Signed)
Patient reports that cough meds are not working and pain is getting worse and sharp in the middle of his chest.

## 2015-05-30 DIAGNOSIS — J101 Influenza due to other identified influenza virus with other respiratory manifestations: Secondary | ICD-10-CM

## 2015-05-30 DIAGNOSIS — E0865 Diabetes mellitus due to underlying condition with hyperglycemia: Secondary | ICD-10-CM

## 2015-05-30 LAB — GLUCOSE, CAPILLARY
GLUCOSE-CAPILLARY: 375 mg/dL — AB (ref 65–99)
GLUCOSE-CAPILLARY: 221 mg/dL — AB (ref 65–99)
Glucose-Capillary: 209 mg/dL — ABNORMAL HIGH (ref 65–99)
Glucose-Capillary: 200 mg/dL — ABNORMAL HIGH (ref 65–99)

## 2015-05-30 LAB — LEGIONELLA PNEUMOPHILA SEROGP 1 UR AG: L. PNEUMOPHILA SEROGP 1 UR AG: NEGATIVE

## 2015-05-30 LAB — RESPIRATORY VIRUS PANEL
Adenovirus: NEGATIVE
INFLUENZA A: NEGATIVE
Influenza B: POSITIVE — AB
Metapneumovirus: NEGATIVE
PARAINFLUENZA 1 A: NEGATIVE
PARAINFLUENZA 2 A: NEGATIVE
PARAINFLUENZA 3 A: NEGATIVE
Respiratory Syncytial Virus A: NEGATIVE
Respiratory Syncytial Virus B: NEGATIVE
Rhinovirus: NEGATIVE

## 2015-05-30 LAB — LACTIC ACID, PLASMA: LACTIC ACID, VENOUS: 5.8 mmol/L — AB (ref 0.5–2.0)

## 2015-05-30 LAB — EXPECTORATED SPUTUM ASSESSMENT W REFEX TO RESP CULTURE

## 2015-05-30 LAB — EXPECTORATED SPUTUM ASSESSMENT W GRAM STAIN, RFLX TO RESP C

## 2015-05-30 MED ORDER — BENZONATATE 100 MG PO CAPS
200.0000 mg | ORAL_CAPSULE | Freq: Three times a day (TID) | ORAL | Status: DC
  Administered 2015-05-30 – 2015-06-02 (×10): 200 mg via ORAL
  Filled 2015-05-30 (×10): qty 2

## 2015-05-30 MED ORDER — METHYLPREDNISOLONE SODIUM SUCC 125 MG IJ SOLR
60.0000 mg | Freq: Two times a day (BID) | INTRAMUSCULAR | Status: DC
  Administered 2015-05-30 – 2015-05-31 (×2): 60 mg via INTRAVENOUS
  Filled 2015-05-30 (×2): qty 2

## 2015-05-30 MED ORDER — HYDROCOD POLST-CPM POLST ER 10-8 MG/5ML PO SUER
10.0000 mL | Freq: Two times a day (BID) | ORAL | Status: DC
  Administered 2015-05-30 – 2015-06-01 (×4): 10 mL via ORAL
  Filled 2015-05-30 (×4): qty 10

## 2015-05-30 MED ORDER — FUROSEMIDE 10 MG/ML IJ SOLN
40.0000 mg | Freq: Once | INTRAMUSCULAR | Status: AC
  Administered 2015-05-30: 40 mg via INTRAVENOUS
  Filled 2015-05-30: qty 4

## 2015-05-30 MED ORDER — INSULIN GLARGINE 100 UNIT/ML ~~LOC~~ SOLN
60.0000 [IU] | Freq: Every day | SUBCUTANEOUS | Status: DC
  Administered 2015-05-31 – 2015-06-02 (×3): 60 [IU] via SUBCUTANEOUS
  Filled 2015-05-30 (×3): qty 0.6

## 2015-05-30 MED ORDER — IPRATROPIUM-ALBUTEROL 0.5-2.5 (3) MG/3ML IN SOLN
3.0000 mL | Freq: Three times a day (TID) | RESPIRATORY_TRACT | Status: DC
  Administered 2015-05-30 – 2015-06-02 (×10): 3 mL via RESPIRATORY_TRACT
  Filled 2015-05-30 (×11): qty 3

## 2015-05-30 MED ORDER — INSULIN ASPART 100 UNIT/ML ~~LOC~~ SOLN
4.0000 [IU] | Freq: Three times a day (TID) | SUBCUTANEOUS | Status: DC
  Administered 2015-05-30 – 2015-06-02 (×7): 4 [IU] via SUBCUTANEOUS

## 2015-05-30 NOTE — Plan of Care (Signed)
Problem: Phase II Progression Outcomes Goal: O2 sats > equal to 90% on RA or at baseline Outcome: Completed/Met Date Met:  05/30/15 Pt 02 sats above 90% on RA Goal: Pain controlled on oral analgesia Outcome: Completed/Met Date Met:  05/30/15 Pt remains free from pain at this time Goal: ADLs completed with minimal assistance Outcome: Completed/Met Date Met:  05/30/15 Pt able to complete ADLs with min assist at this time Goal: Tolerating diet Outcome: Completed/Met Date Met:  05/30/15 Pt able to tolerate current diet with no difficulties at this time

## 2015-05-30 NOTE — Progress Notes (Signed)
Inpatient Diabetes Program Recommendations  AACE/ADA: New Consensus Statement on Inpatient Glycemic Control (2015)  Target Ranges:  Prepandial:   less than 140 mg/dL      Peak postprandial:   less than 180 mg/dL (1-2 hours)      Critically ill patients:  140 - 180 mg/dL   Results for TASHA, BUSSIERE (MRN [id number]) as of 05/30/2015 10:32  Ref. Range 05/29/2015 08:25 05/29/2015 11:49 05/29/2015 16:52 05/29/2015 22:09  Glucose-Capillary Latest Ref Range: 65-99 mg/dL 275 (H) 314 (H) 256 (H) 291 (H)   Results for PATRYK, WILKS (MRN [id number]) as of 05/30/2015 10:32  Ref. Range 05/30/2015 07:28  Glucose-Capillary Latest Ref Range: 65-99 mg/dL 209 (H)    Admit with: COPD/ Sepsis/ +Influenza  History: DM, COPD, CHF  Home DM Meds: Lantus 80 units QAM       Metformin 1000 mg bid  Current Insulin Orders: Lantus 50 units QAM      Novolog Sensitive Correction Scale/ SSI (0-9 units) TID AC + HS       -Note patient currently receiving IV Solumedrol 60 mg tid.  -Glucose levels greater >200 mg/dl.     MD- Please consider the following in-hospital insulin adjustments:  1. Increase Lantus to 60 units QAM  2. Start Novolog Meal Coverage- Novolog 4 units tidwc (hold if pt eats <50% of meal)     --Will follow patient during hospitalization--  Wyn Quaker RN, MSN, CDE Diabetes Coordinator Inpatient Glycemic Control Team Team Pager: [phone] (8a-5p)

## 2015-05-30 NOTE — Progress Notes (Addendum)
[facility] PROGRESS NOTE  [name] NWG:[id number] DOB: 1950-10-15 DOA: 05/27/2015 PCP: [name], MD  brief narrative 65 year old male with history of hypertension, diabetes mellitus on insulin and metformin, COPD not on home O2, asthma, GERD, coronary artery disease, hep C, diastolic CHF, arthritis, peripheral vascular disease was hospitalized one month back for healthcare associated pneumonia presented with progressive shortness of breath for the past 3 days associated with subjective fever at home and cough with occasional whitish phlegm. In the ED patient was septic with fever of 102.41F, tachypnea, tachycardia (HR 141) and markedly elevated lactic acid of 6.39 which increased further to 10. Renal function was stable. Blood gas showed pH of 7.27, PCO2 of 29 and PO2 of 78. Chest x-ray was unremarkable. Flu PCR was positive for influenza B. Patient admitted to stepdown for further management.   Assessment/Plan: Severe sepsis with acute hypoxic respiratory failure  secondary to influenza B and COPD exacerbation. Sepsis pathway initiated in the ED. Received total 4 L bolus in the followed by maintenance fluid. Lactic acid trending down. -Continue O2 via nasal cannula. -Changed IV Solu-Medrol to every 12 hours. DuoNeb every 4 hours and albuterol nebs when necessary. Added empiric Levaquin. Continue Tamiflu. -Patient has ongoing cough causing him to have chest discomfort. Increased dose of Tussinex  and Tessalon Pearls.   Lactic acidosis Suspect this is partly due to sepsis and due to metformin. CT abdomen and pelvis done was unremarkable. Discontinue metformin. (Should be off of it completely)  trending down.   COPD with exacerbation Patient reports quitting smoking 2 years back. Uses albuterol inhaler only. Continue Solu-Medrol, scheduled nebs and antitussives. Added Levaquin. -Needs to establish outpatient care with pulmonary and PFT. ( will arrange follow-up with  community upon discharge)  Essential hypertension Holding losartan-HCTZ for now.  Chronic diastolic CHF/CAD status peripheral vascular disease Patient euvolemic. Monitor with aggressive IV hydration. Continue aspirin  Uncontrolled type 2 diabetes mellitus Discontinue metformin. A1c from January 2017 of 10.6.CBG greater than 200 persistently. Will increase Lantus to 60 units daily and add me coverage   GERD Continue PPI   DVT prophylaxis: Subcutaneous Lovenox Diet: Heart Healthy  Code Status: Full code Family Communication: None at bedside Disposition Plan: Transfer to telemetry.   Consultants:  PC CM  Procedures:  CT abdomen and pelvis (ordered)  Antibiotics:  IV vancomycin and cefepime 1  Levaquin since 3/25  HPI/Subjective: Seen and examined.During much better but still has ongoing cough.   Objective: Filed Vitals:   05/30/15 0300 05/30/15 0500  BP: 132/73 143/80  Pulse:    Temp:  99.3 F (37.4 C)  Resp: 20 21    Intake/Output Summary (Last 24 hours) at 05/30/15 1205 Last data filed at 05/30/15 0631  Gross per 24 hour  Intake    720 ml  Output   1750 ml  Net  -1030 ml   Filed Weights   05/28/15 1243 05/29/15 0443 05/30/15 0500  Weight: 85.4 kg (188 lb 4.4 oz) 86.546 kg (190 lb 12.8 oz) 87.045 kg (191 lb 14.4 oz)    Exam:   General: In distress   HEENT: , moist mucosa,  supple neck  Cardiovascular: S1 and S2amylase, no murmurs rub and gallop  Respiratory: clear bilaterally   Abdomen: Soft, non  distended, nontender, bowel sounds present  Musculoskeletal: Warm, trace edema    Data Reviewed: Basic Metabolic Panel:  Recent Labs Lab 05/27/15 2315  NA 137  K 3.9  CL 100*  CO2 19*  GLUCOSE  229*  BUN 11  CREATININE 1.09  CALCIUM 8.7*   Liver Function Tests: No results for input(s): AST, ALT, ALKPHOS, BILITOT, PROT, ALBUMIN in the last 168 hours. No results for input(s): LIPASE, AMYLASE in the last 168 hours. No results for  input(s): AMMONIA in the last 168 hours. CBC:  Recent Labs Lab 05/27/15 2315  WBC 9.3  NEUTROABS 7.7  HGB 13.4  HCT 41.3  MCV 86.2  PLT 227   Cardiac Enzymes: No results for input(s): CKTOTAL, CKMB, CKMBINDEX, TROPONINI in the last 168 hours. BNP (last 3 results)  Recent Labs  03/08/15 1905 04/26/15 0953 05/28/15 0344  BNP 18.1 56.5 54.9    ProBNP (last 3 results) No results for input(s): PROBNP in the last 8760 hours.  CBG:  Recent Labs Lab 05/29/15 1149 05/29/15 1652 05/29/15 2209 05/30/15 0728 05/30/15 1105  GLUCAP 314* 256* 291* 209* 375*    Recent Results (from the past 240 hour(s))  Blood Culture (routine x 2)     Status: None (Preliminary result)   Collection Time: 05/27/15 11:50 PM  Result Value Ref Range Status   Specimen Description BLOOD LEFT ANTECUBITAL  Final   Special Requests BOTTLES DRAWN AEROBIC AND ANAEROBIC 5CC  Final   Culture NO GROWTH 2 DAYS  Final   Report Status PENDING  Incomplete  Blood Culture (routine x 2)     Status: None (Preliminary result)   Collection Time: 05/28/15 12:25 AM  Result Value Ref Range Status   Specimen Description BLOOD LEFT FOREARM  Final   Special Requests BOTTLES DRAWN AEROBIC AND ANAEROBIC 10CC  Final   Culture NO GROWTH 2 DAYS  Final   Report Status PENDING  Incomplete  Urine culture     Status: None   Collection Time: 05/28/15  2:33 AM  Result Value Ref Range Status   Specimen Description URINE, CLEAN CATCH  Final   Special Requests NONE  Final   Culture 5,000 COLONIES/mL INSIGNIFICANT GROWTH  Final   Report Status 05/29/2015 FINAL  Final  MRSA PCR Screening     Status: None   Collection Time: 05/28/15 12:54 PM  Result Value Ref Range Status   MRSA by PCR NEGATIVE NEGATIVE Final    Comment:        The GeneXpert MRSA Assay (FDA approved for NASAL specimens only), is one component of a comprehensive MRSA colonization surveillance program. It is not intended to diagnose MRSA infection nor to  guide or monitor treatment for MRSA infections.   Culture, sputum-assessment     Status: None   Collection Time: 05/29/15 11:20 PM  Result Value Ref Range Status   Specimen Description SPUTUM  Final   Special Requests NONE  Final   Sputum evaluation THIS SPECIMEN IS ACCEPTABLE FOR SPUTUM CULTURE  Final   Report Status 05/30/2015 FINAL  Final     Studies: Ct Angio Abd/pel W/ And/or W/o  05/28/2015  CLINICAL DATA:  65 year old male with abdominal pain and distention. Recent diagnosis of influenza B EXAM: CTA ABDOMEN AND PELVIS wITHOUT AND WITH CONTRAST TECHNIQUE: Multidetector CT imaging of the abdomen and pelvis was performed using the standard protocol during bolus administration of intravenous contrast. Multiplanar reconstructed images and MIPs were obtained and reviewed to evaluate the vascular anatomy. CONTRAST:  100 mL Isovue 370 COMPARISON:  Prior CT scan of the abdomen and pelvis 07/17/2011 FINDINGS: VASCULAR Aorta: Normal caliber abdominal aorta without evidence of aneurysm or dissection. Peripheral calcified atherosclerotic plaque without significant stenosis or irregularity. Celiac: Widely patent.  Conventional hepatic arterial anatomy. No visceral artery aneurysm. SMA: Widely patent and unremarkable. Renals: Single renal arteries bilaterally which are widely patent. No fibromuscular dysplasia. IMA: Widely patent. Inflow: Bulky calcified atherosclerotic plaque results in probable moderate stenosis of the proximal and mid left common iliac artery. High-grade stenosis of the origin of the left internal iliac artery. At least moderate stenosis of the origin of the right internal iliac artery. Bulky calcified plaque continues throughout the external iliac arteries. No significant focal stenosis. Proximal Outflow: At least moderate stenosis of the right common femoral artery. Significant disease noted in the visualized portions of both superficial femoral arteries. Veins: No focal venous  abnormality. NON-VASCULAR Lower Chest: The heart is within normal limits for size. No pericardial effusion. Unremarkable distal thoracic esophagus. Minimal dependent atelectasis. Otherwise the lung bases are clear. Abdomen: Unremarkable CT appearance of the stomach, duodenum, spleen, adrenal glands and pancreas. Abnormal appearance of the liver with relative hypertrophy of the left hepatic lobe, a faintly nodular contour and perhaps mildly hypo attenuating hepatic parenchyma. No discrete hepatic lesions. Gallbladder is unremarkable. No intra or extrahepatic biliary ductal dilatation. Unremarkable appearance of the bilateral kidneys. No focal solid lesion, hydronephrosis or nephrolithiasis. Tiny low-attenuation lesions bilaterally are too small for accurate characterization but are statistically highly likely benign cysts. No evidence of obstruction or focal bowel wall thickening. Normal appendix in the right lower quadrant. The terminal ileum is unremarkable. No free fluid or suspicious adenopathy. Pelvis: Unremarkable bladder, prostate gland and seminal vesicles. No free fluid or suspicious adenopathy. Bones/Soft Tissues: No acute fracture or aggressive appearing lytic or blastic osseous lesion. Review of the MIP images confirms the above findings. IMPRESSION: VASCULAR 1. No acute vascular abnormality. 2. Widely patent visceral vessels. No evidence of acute or chronic mesenteric ischemia. 3. Significant peripheral arterial disease with at least moderate stenoses of the left common iliac artery, right common femoral artery and bilateral superficial femoral arteries. Does the patient have a clinical history of claudication? NON VASCULAR 1. Suspect early cirrhotic change in the liver as well as mild hypoattenuation concerning for underlying steatosis. Recommend clinical correlation with serum LFTs. 2. No acute abnormality in the abdomen or pelvis. Signed, Sterling Big, MD [facility] Radiology Electronically Signed   By: Malachy Moan M.D.   On: 05/28/2015 12:52    Scheduled Meds: . aspirin EC  81 mg Oral Daily  . benzonatate  200 mg Oral TID  . chlorpheniramine-HYDROcodone  10 mL Oral Q12H  . cholecalciferol  2,000 Units Oral Daily  . enoxaparin (LOVENOX) injection  40 mg Subcutaneous Q24H  . insulin aspart  0-5 Units Subcutaneous QHS  . insulin aspart  0-9 Units Subcutaneous TID WC  . insulin glargine  50 Units Subcutaneous Daily  . ipratropium-albuterol  3 mL Nebulization TID  . methylPREDNISolone (SOLU-MEDROL) injection  60 mg Intravenous 3 times per day  . omega-3 acid ethyl esters  1 g Oral Daily  . oseltamivir  75 mg Oral BID  . pantoprazole  40 mg Oral Daily   Continuous Infusions:     Time spent: 25 minutes    [name]  [facility] Pager [phone]. If 7PM-7AM, please contact night-coverage at [email or web address], password [facility] 05/30/2015, 12:05 PM  LOS: 2 days

## 2015-05-30 NOTE — Plan of Care (Signed)
Problem: ICU Phase Progression Outcomes Goal: O2 sats trending toward baseline Outcome: Completed/Met Date Met:  05/30/15 02 sats WNL Goal: Dyspnea controlled at rest Outcome: Progressing Pt still experiencing some dyspnea at rest  Problem: Phase I Progression Outcomes Goal: O2 sats > or equal 90% or at baseline Outcome: Completed/Met Date Met:  05/30/15 02 sats WNL Goal: Tolerating diet Outcome: Completed/Met Date Met:  05/30/15 Pt able to tolerate current diet with no difficulties   Problem: Education: Goal: Knowledge of [facility] Education information/materials will improve Outcome: Completed/Met Date Met:  05/30/15 Pt educated on pain ratings and interventions, available resources, Thermal values, tests, procedures, medications, hourly rounding   Problem: Safety: Goal: Ability to remain free from injury will improve Outcome: Completed/Met Date Met:  05/30/15 Pt remains free from injury during this admission

## 2015-05-30 NOTE — Consult Note (Signed)
   [facility] Inpatient Consult   05/30/2015  [name] 08-19-1950 [id number] Patient is currently active with [facility] for chronic disease management services.  Patient has been engaged by a [employer].  Our community based plan of care has focused on disease management and community resource support.  Patient has admitted for elevated lactic acid, Acute respiratory failure and possibly sepsis with flu.  Patient will receive a post discharge transition of care call and will be evaluated for monthly home visits for assessments and disease process education/monitoring.  Made Inpatient Case Manager aware that [facility] following. Of note, [facility] Care Management services does not replace or interfere with any services that are needed or arranged by inpatient case management or social work.  For additional questions or referrals please contact: Natividad Brood, RN BSN [facility] Liaison  [phone] business mobile phone Toll free office [phone]

## 2015-05-30 NOTE — Progress Notes (Signed)
PULMONARY / CRITICAL CARE MEDICINE   Name: [name] MRN: [id number] DOB: March 26, 1950    ADMISSION DATE:  05/27/2015 CONSULTATION DATE:  05/28/2015  REFERRING MD:  [facility]  CHIEF COMPLAINT:  Elevated lactic acid  HISTORY OF PRESENT ILLNESS:   This is a pleasant 65 year old male who quit smoking 2 years ago and has a history of COPD and hepatitis C who presented to the emergency department on 05/27/2015 with a complaint of 3 days of shortness of breath, cough, and nonbloody mucus production. He states that he has been in and out of the hospital several times in the last several months with various respiratory problems. However, he recovered after the most recent episode for healthcare associated pneumonia fairly well. 3 days ago he started having cough, wheezing, fever, and chills. He says that he has not had any pain in his belly with the exception of some mild soreness when he coughs. He denies significant dysuria though he states that he has a mild amount of stinging when he urinates. He denies any diarrhea or blood loss.  In the emergency department he was noted to be febrile with quite significant tachycardia and so he was treated with broad-spectrum antibiotics and IV fluids.   His influenza test was positive for flu B. At this point his tachycardia has significantly improved and his breathing has improved though he states that he still is quite not back to baseline.  Pulmonary and critical care medicine was consulted for a remarkably elevated lactic acid. He tells me that he takes metformin regularly. And has been taking it this week.    SUBJECTIVE: Patient reports dyspnea and chest discomfort from cough slowly improving. Cough largely nonproductive. No other new chest pain or pressure.  REVIEW OF SYSTEMS:  Denies any subjective fever, chills, or sweats. Denies any abdominal pain or nausea.  VITAL SIGNS: BP 156/83 mmHg  Pulse 88  Temp(Src) 97.7 F (36.5 C) (Axillary)   Resp 21  Ht '5\' 6"'  (1.676 m)  Wt 87.045 kg (191 lb 14.4 oz)  BMI 30.99 kg/m2  SpO2 99%  HEMODYNAMICS:    VENTILATOR SETTINGS:    INTAKE / OUTPUT: I/O last 3 completed shifts: In: 85 [P.O.:1320] Out: 3650 [Urine:3650]  PHYSICAL EXAMINATION: General:  Awake. Alert. No acute distress. Sitting up in chair watching TV.  Integument:  Warm & dry. No rash on exposed skin. HEENT:  Moist mucus membranes. No oral ulcers. No scleral injection or icterus. Cardiovascular:  Regular rate. No edema. No appreciable JVD.  Pulmonary:  Symmetrically decreased aeration. Normal work of breathing on room air. Abdomen: Soft. Normal bowel sounds. Nondistended.  Neurological: Moving all 4 extremities equally. Grossly nonfocal. Alert and oriented 4.  LABS:  BMET  Recent Labs Lab 05/27/15 2315  NA 137  K 3.9  CL 100*  CO2 19*  BUN 11  CREATININE 1.09  GLUCOSE 229*    Electrolytes  Recent Labs Lab 05/27/15 2315  CALCIUM 8.7*    CBC  Recent Labs Lab 05/27/15 2315  WBC 9.3  HGB 13.4  HCT 41.3  PLT 227    Coag's  Recent Labs Lab 05/27/15 0154  APTT 34  INR 1.08    Sepsis Markers  Recent Labs Lab 05/27/15 0154  05/29/15 1310 05/29/15 1657 05/29/15 1914  LATICACIDVEN  --   < > 5.8* 2.4* 2.4*  PROCALCITON 0.58  --   --   --   --   < > = values in this interval not displayed.  ABG  Recent Labs Lab 05/28/15 0142  PHART 7.245*  PCO2ART 31.8*  PO2ART 90.0    Liver Enzymes No results for input(s): AST, ALT, ALKPHOS, BILITOT, ALBUMIN in the last 168 hours.  Cardiac Enzymes No results for input(s): TROPONINI, PROBNP in the last 168 hours.  Glucose  Recent Labs Lab 05/29/15 0825 05/29/15 1149 05/29/15 1652 05/29/15 2209 05/30/15 0728 05/30/15 1105  GLUCAP 275* 314* 256* 291* 209* 375*    Imaging No results found.   STUDIES:  CXR images personally reviewed> no infiltrate, improved compared to February films  CULTURES: Influenza PCR 3/25:   Positive B Respiratory Viral Panel PCR 3/25>>> Sputum Ctx 3/26>>> Blood Ctx x2 3/25>>> Urine Ctx 3/25:  Insignificant growth MRSA PCR 3/25:  Negative  ANTIBIOTICS: Tamiflu 3/25>>> Vancomycin 3/24 - 3/25 Cefepime 3/24 - 3/25  SIGNIFICANT EVENTS: 3/24 - Admit  LINES/TUBES: PIV x2  DISCUSSION: 65 y/o male with a COPD exacerbation secondary to influenza here with a remarkably elevated lactic acid.  I agree that on admission he met SIRS criteria and appeared septic, but truly his symptoms are best explained by the influenza and an exacerbation of COPD.  So the rising lactic acid despite improving HR and normal blood pressure does not seem to be explained by shock.  One must consider so-called "B-type" causes of lactic acidosis and in his situation I worry that the metformin is causing this.  Some feel that metformin is contra-indicated in patients with COPD, see product label.    ASSESSMENT / PLAN:  65 y.o. male with known history of COPD presenting with acute exacerbation secondary to influenza B pneumonia. Patient also has mild lactic acidosis likely secondary to metformin. Clinically slowly improving. Currently on room air.  1. Influenza B Pneumonia: Currently on Tamiflu by primary service. 2. COPD exacerbation: Patient continuing on Solu-Medrol 60 mg IV every 12 hours & Duonebs nebulized 3 times a day. Tessalon Perles for cough suppression. 3. Lactic acidosis: Appears stable. Agree with continuing to hold metformin.  At this time PCCM will sign off  please reconsult [place] if we can be of any further assistance in the care of this patient.    Sonia Baller Ashok Cordia, M.D. [facility] Pager:  [phone] After 3pm or if no response, call [phone] 05/30/2015, 2:54 PM

## 2015-05-31 ENCOUNTER — Other Ambulatory Visit: Payer: Self-pay | Admitting: *Deleted

## 2015-05-31 LAB — GLUCOSE, CAPILLARY
GLUCOSE-CAPILLARY: 296 mg/dL — AB (ref 65–99)
GLUCOSE-CAPILLARY: 203 mg/dL — AB (ref 65–99)
Glucose-Capillary: 221 mg/dL — ABNORMAL HIGH (ref 65–99)
Glucose-Capillary: 233 mg/dL — ABNORMAL HIGH (ref 65–99)

## 2015-05-31 MED ORDER — PREDNISONE 20 MG PO TABS
40.0000 mg | ORAL_TABLET | Freq: Every day | ORAL | Status: DC
  Administered 2015-05-31 – 2015-06-02 (×3): 40 mg via ORAL
  Filled 2015-05-31 (×2): qty 2
  Filled 2015-05-31: qty 4

## 2015-05-31 NOTE — Progress Notes (Addendum)
[facility] PROGRESS NOTE  [name] [id number] DOB: 07-21-1950 DOA: 05/27/2015 PCP: [name], MD  brief narrative 65 year old male with history of hypertension, diabetes mellitus on insulin and metformin, COPD not on home O2, asthma, GERD, coronary artery disease, hep C, diastolic CHF, arthritis, peripheral vascular disease was hospitalized one month back for healthcare associated pneumonia presented with progressive shortness of breath for the past 3 days associated with subjective fever at home and cough with occasional whitish phlegm.  patient was septic with fever of 102.84F, tachypnea, tachycardia (HR 141) and markedly elevated lactic acid of 6.39 which increased further to 10. Renal function  stable. Chest x-ray was unremarkable. Flu PCR was positive for influenza B.  admitted to stepdown for further management.   Assessment/Plan: Severe sepsis with acute hypoxic respiratory failure  secondary to influenza B and COPD exacerbation. Sepsis pathway initiated in the ED. given aggressive IV hydration. -Managed with IV Solu-Medrol, scheduled DuoNeb's, empiric Levaquin and Tamiflu. -Severe cough causing significant discomfort. Improved with Increased dose of Tussinex  and Tessalon Pearls. -Now stable on room air. Still wheezy off and on. -Discontinue telemetry and anticoagulation. Continue nebs for today. Transition to oral prednisone.    Lactic acidosis Suspect this is partly due to sepsis and due to metformin. CT abdomen and pelvis done was unremarkable. Discontinue metformin. (Should be off of it completely) Now improved.   COPD with exacerbation quit smoking 2 years back. Uses albuterol inhaler only.  Treatment as above. -Needs to establish outpatient care with pulmonary and PFT. (appoint made to tammy parrett , NP on 06/06/2015 at 3pm)    Essential hypertension Holding losartan-HCTZ for now.  Chronic diastolic CHF/CAD status peripheral vascular  disease  euvolemic.  Continue aspirin  Uncontrolled type 2 diabetes mellitus Discontinue metformin. A1c from January 2017 of 10.6.CBG greater than 200 persistently.increased Lantus to 60 units daily and added meal coverage.  GERD Continue PPI   DVT prophylaxis: Subcutaneous Lovenox Diet: Heart Healthy  Code Status: Full code Family Communication: None at bedside Disposition Plan: monitor for further clinical improvement today. Possible d/c home on 3/29.   Consultants:  PC CM  Procedures:  CT abdomen and pelvis (ordered)  Antibiotics:  IV vancomycin and cefepime 1  Levaquin since 3/25--  tamiflu 3/25--  HPI/Subjective: Seen and examined. Breathing much better. Cough started to improve.  Objective: Filed Vitals:   05/30/15 2122 05/31/15 0454  BP: 169/97 156/90  Pulse: 87 77  Temp: 98.7 F (37.1 C) 97.7 F (36.5 C)  Resp: 22     Intake/Output Summary (Last 24 hours) at 05/31/15 1026 Last data filed at 05/31/15 0907  Gross per 24 hour  Intake    240 ml  Output   3175 ml  Net  -2935 ml   Filed Weights   05/29/15 0443 05/30/15 0500 05/31/15 0454  Weight: 86.546 kg (190 lb 12.8 oz) 87.045 kg (191 lb 14.4 oz) 85.594 kg (188 lb 11.2 oz)    Exam:   General: not In distress   HEENT: , moist mucosa,  supple neck  Cardiovascular: S1 and S2 normal, no murmurs rub and gallop  Respiratory: clear bilaterally   Abdomen: Soft, non  distended, nontender, bowel sounds present  Musculoskeletal: Warm, no  edema    Data Reviewed: Basic Metabolic Panel:  Recent Labs Lab 05/27/15 2315  NA 137  K 3.9  CL 100*  CO2 19*  GLUCOSE 229*  BUN 11  CREATININE 1.09  CALCIUM 8.7*   Liver Function Tests:  No results for input(s): AST, ALT, ALKPHOS, BILITOT, PROT, ALBUMIN in the last 168 hours. No results for input(s): LIPASE, AMYLASE in the last 168 hours. No results for input(s): AMMONIA in the last 168 hours. CBC:  Recent Labs Lab 05/27/15 2315  WBC  9.3  NEUTROABS 7.7  HGB 13.4  HCT 41.3  MCV 86.2  PLT 227   Cardiac Enzymes: No results for input(s): CKTOTAL, CKMB, CKMBINDEX, TROPONINI in the last 168 hours. BNP (last 3 results)  Recent Labs  03/08/15 1905 04/26/15 0953 05/28/15 0344  BNP 18.1 56.5 54.9    ProBNP (last 3 results) No results for input(s): PROBNP in the last 8760 hours.  CBG:  Recent Labs Lab 05/30/15 0728 05/30/15 1105 05/30/15 1624 05/30/15 2118 05/31/15 0729  GLUCAP 209* 375* 221* 200* 221*    Recent Results (from the past 240 hour(s))  Blood Culture (routine x 2)     Status: None (Preliminary result)   Collection Time: 05/27/15 11:50 PM  Result Value Ref Range Status   Specimen Description BLOOD LEFT ANTECUBITAL  Final   Special Requests BOTTLES DRAWN AEROBIC AND ANAEROBIC 5CC  Final   Culture NO GROWTH 2 DAYS  Final   Report Status PENDING  Incomplete  Blood Culture (routine x 2)     Status: None (Preliminary result)   Collection Time: 05/28/15 12:25 AM  Result Value Ref Range Status   Specimen Description BLOOD LEFT FOREARM  Final   Special Requests BOTTLES DRAWN AEROBIC AND ANAEROBIC 10CC  Final   Culture NO GROWTH 2 DAYS  Final   Report Status PENDING  Incomplete  Respiratory virus panel     Status: Abnormal   Collection Time: 05/28/15  2:05 AM  Result Value Ref Range Status   Respiratory Syncytial Virus A Negative Negative Final   Respiratory Syncytial Virus B Negative Negative Final   Influenza A Negative Negative Final   Influenza B Positive (A) Negative Final   Parainfluenza 1 Negative Negative Final   Parainfluenza 2 Negative Negative Final   Parainfluenza 3 Negative Negative Final   Metapneumovirus Negative Negative Final   Rhinovirus Negative Negative Final   Adenovirus Negative Negative Final    Comment: (NOTE) Performed At: [facility] [address] [place], [place] [id number] Mila Homer MD KG:[id number]   Urine culture     Status: None    Collection Time: 05/28/15  2:33 AM  Result Value Ref Range Status   Specimen Description URINE, CLEAN CATCH  Final   Special Requests NONE  Final   Culture 5,000 COLONIES/mL INSIGNIFICANT GROWTH  Final   Report Status 05/29/2015 FINAL  Final  MRSA PCR Screening     Status: None   Collection Time: 05/28/15 12:54 PM  Result Value Ref Range Status   MRSA by PCR NEGATIVE NEGATIVE Final    Comment:        The GeneXpert MRSA Assay (FDA approved for NASAL specimens only), is one component of a comprehensive MRSA colonization surveillance program. It is not intended to diagnose MRSA infection nor to guide or monitor treatment for MRSA infections.   Culture, sputum-assessment     Status: None   Collection Time: 05/29/15 11:20 PM  Result Value Ref Range Status   Specimen Description SPUTUM  Final   Special Requests NONE  Final   Sputum evaluation THIS SPECIMEN IS ACCEPTABLE FOR SPUTUM CULTURE  Final   Report Status 05/30/2015 FINAL  Final  Culture, respiratory (NON-Expectorated)     Status: None (Preliminary result)  Collection Time: 05/29/15 11:20 PM  Result Value Ref Range Status   Specimen Description SPUTUM  Final   Special Requests NONE  Final   Gram Stain   Final    ABUNDANT WBC PRESENT,BOTH PMN AND MONONUCLEAR FEW SQUAMOUS EPITHELIAL CELLS PRESENT FEW GRAM POSITIVE COCCI IN PAIRS FEW GRAM NEGATIVE RODS THIS SPECIMEN IS ACCEPTABLE FOR SPUTUM CULTURE Performed at [facility]    Culture   Final    Culture reincubated for better growth Performed at [facility]    Report Status PENDING  Incomplete     Studies: No results found.  Scheduled Meds: . aspirin EC  81 mg Oral Daily  . benzonatate  200 mg Oral TID  . chlorpheniramine-HYDROcodone  10 mL Oral Q12H  . cholecalciferol  2,000 Units Oral Daily  . enoxaparin (LOVENOX) injection  40 mg Subcutaneous Q24H  . insulin aspart  0-5 Units Subcutaneous QHS  . insulin aspart  0-9 Units Subcutaneous TID WC   . insulin aspart  4 Units Subcutaneous TID WC  . insulin glargine  60 Units Subcutaneous Daily  . ipratropium-albuterol  3 mL Nebulization TID  . omega-3 acid ethyl esters  1 g Oral Daily  . oseltamivir  75 mg Oral BID  . pantoprazole  40 mg Oral Daily  . predniSONE  40 mg Oral Q breakfast   Continuous Infusions:     Time spent: 25 minutes    Lashawn Bromwell  [facility] Pager [phone]. If 7PM-7AM, please contact night-coverage at [email or web address], password [facility] 05/31/2015, 10:26 AM  LOS: 3 days

## 2015-05-31 NOTE — Care Management Important Message (Signed)
Important Message  Patient Details  Name: DILLION KUETHE MRN: [id number] Date of Birth: Sep 07, 1950   Medicare Important Message Given:  Yes    Bethena Roys, RN 05/31/2015, 12:04 PM

## 2015-05-31 NOTE — Patient Outreach (Signed)
Noted that member was readmitted to hospital over the weekend.  Hospital liaison notified, will restart transition of care program once discharged.  Valente David, BSN, [facility]  [facility] Care Manager [phone]

## 2015-06-01 DIAGNOSIS — I5032 Chronic diastolic (congestive) heart failure: Secondary | ICD-10-CM

## 2015-06-01 LAB — CULTURE, RESPIRATORY

## 2015-06-01 LAB — BASIC METABOLIC PANEL
Anion gap: 9 (ref 5–15)
BUN: 18 mg/dL (ref 6–20)
CALCIUM: 8.3 mg/dL — AB (ref 8.9–10.3)
CO2: 25 mmol/L (ref 22–32)
CREATININE: 0.91 mg/dL (ref 0.61–1.24)
Chloride: 99 mmol/L — ABNORMAL LOW (ref 101–111)
GFR calc Af Amer: >60 mL/min (ref >60)
GFR calc non Af Amer: >60 mL/min (ref >60)
GLUCOSE: 278 mg/dL — AB (ref 65–99)
Potassium: 3.8 mmol/L (ref 3.5–5.1)
Sodium: 133 mmol/L — ABNORMAL LOW (ref 135–145)

## 2015-06-01 LAB — CBC
HCT: 39.1 % (ref 39.0–52.0)
Hemoglobin: 12.7 g/dL — ABNORMAL LOW (ref 13.0–17.0)
MCH: 27.3 pg (ref 26.0–34.0)
MCHC: 32.5 g/dL (ref 30.0–36.0)
MCV: 84.1 fL (ref 78.0–100.0)
PLATELETS: 193 10*3/uL (ref 150–400)
RBC: 4.65 MIL/uL (ref 4.22–5.81)
RDW: 13.5 % (ref 11.5–15.5)
WBC: 5.6 10*3/uL (ref 4.0–10.5)

## 2015-06-01 LAB — GLUCOSE, CAPILLARY
Glucose-Capillary: 69 mg/dL (ref 65–99)
Glucose-Capillary: 191 mg/dL — ABNORMAL HIGH (ref 65–99)
Glucose-Capillary: 192 mg/dL — ABNORMAL HIGH (ref 65–99)
Glucose-Capillary: 150 mg/dL — ABNORMAL HIGH (ref 65–99)

## 2015-06-01 LAB — LACTIC ACID, PLASMA: LACTIC ACID, VENOUS: 2 mmol/L (ref 0.5–2.0)

## 2015-06-01 LAB — CULTURE, RESPIRATORY W GRAM STAIN: Culture: NORMAL

## 2015-06-01 MED ORDER — GUAIFENESIN-CODEINE 100-10 MG/5ML PO SOLN
5.0000 mL | Freq: Four times a day (QID) | ORAL | Status: DC | PRN
Start: 2015-06-01 — End: 2015-06-02
  Administered 2015-06-01 – 2015-06-02 (×3): 5 mL via ORAL
  Filled 2015-06-01 (×3): qty 5

## 2015-06-01 NOTE — Progress Notes (Signed)
[facility]                                                                              Patient Demographics  [name], is a 65 y.o. male, DOB - 02-19-1951, RJJ:[id number]  Admit date - 05/27/2015   Admitting Physician Lorretta Harp, MD  Outpatient Primary MD for the patient is [name], MD  LOS - 4  days    Chief Complaint  Patient presents with  . Shortness of Breath       Brief HPI   65 year old male with history of hypertension, diabetes mellitus on insulin and metformin, COPD not on home O2, asthma, GERD, coronary artery disease, hep C, diastolic CHF, arthritis, peripheral vascular disease was hospitalized one month back for healthcare associated pneumonia presented with progressive shortness of breath for the past 3 days associated with subjective fever at home and cough with occasional whitish phlegm. patient was septic with fever of 102.1F, tachypnea, tachycardia (HR 141) and markedly elevated lactic acid of 6.39 which increased further to 10. Renal function stable. Chest x-ray was unremarkable. Flu PCR was positive for influenza B. admitted to stepdown for further management.   Assessment & Plan    Severe sepsis with acute hypoxic respiratory failuresecondary to influenza B and COPD exacerbation. - Sepsis pathway initiated in the ED. given aggressive IV hydration. -Managed with IV Solu-Medrol, scheduled DuoNeb's, empiric Levaquin and Tamiflu. -Severe cough causing significant discomfort, improving with increased dose of Tussinex and Tessalon Pearls. -Transition to oral prednisone.    Lactic acidosis Suspect this is partly due to sepsis and due to metformin. CT abdomen and pelvis done was unremarkable. Discontinue metformin. (Should be off of it completely) Now improved.   COPD with exacerbation - quit smoking 2 years back. Uses albuterol inhaler only.  -Needs to establish outpatient care with pulmonary and PFT. (appointment made  to tammy parrett , NP on 06/06/2015 at 3pm)    Essential hypertension Holding losartan-HCTZ for now.  Chronic diastolic CHF/CAD status peripheral vascular disease euvolemic. Continue aspirin  Uncontrolled type 2 diabetes mellitus Discontinue metformin. A1c from January 2017 of 10.6 - CBGs elevated however one reading in 60s this morning, continue lantus to 60 units daily and added meal coverage. Patient takes 80 units at every morning.  GERD Continue PPI    Code Status: Full code  Family Communication: Discussed in detail with the patient, all imaging results, lab results explained to the patient   Disposition Plan: Hopefully DC home in a.m.  Time Spent in minutes   25 minutes  Procedures  None  Consults   None  DVT Prophylaxis  Lovenox  Medications  Scheduled Meds: . aspirin EC  81 mg Oral Daily  . benzonatate  200 mg Oral TID  . chlorpheniramine-HYDROcodone  10 mL Oral Q12H  . cholecalciferol  2,000 Units Oral Daily  . enoxaparin (LOVENOX) injection  40 mg Subcutaneous Q24H  . insulin aspart  0-5 Units Subcutaneous QHS  . insulin aspart  0-9 Units Subcutaneous TID WC  . insulin aspart  4 Units Subcutaneous TID WC  . insulin glargine  60 Units Subcutaneous Daily  .  ipratropium-albuterol  3 mL Nebulization TID  . omega-3 acid ethyl esters  1 g Oral Daily  . oseltamivir  75 mg Oral BID  . pantoprazole  40 mg Oral Daily  . predniSONE  40 mg Oral Q breakfast   Continuous Infusions:  PRN Meds:.albuterol, guaiFENesin-codeine, hydrALAZINE, traZODone   Antibiotics   Anti-infectives    Start     Dose/Rate Route Frequency Ordered Stop   05/28/15 1200  vancomycin (VANCOCIN) IVPB 1000 mg/200 mL premix  Status:  Discontinued     1,000 mg 200 mL/hr over 60 Minutes Intravenous Every 12 hours 05/28/15 0134 05/28/15 1012   05/28/15 1100  ceFEPIme (MAXIPIME) 2 g in dextrose 5 % 50 mL IVPB  Status:  Discontinued     2 g 100 mL/hr over 30 Minutes Intravenous Every 12  hours 05/28/15 0134 05/28/15 1012   05/28/15 0545  oseltamivir (TAMIFLU) capsule 75 mg     75 mg Oral 2 times daily 05/28/15 0543 06/02/15 0959   05/27/15 2345  ceFEPIme (MAXIPIME) 2 g in dextrose 5 % 50 mL IVPB     2 g 100 mL/hr over 30 Minutes Intravenous  Once 05/27/15 2344 05/28/15 0059   05/27/15 2345  vancomycin (VANCOCIN) IVPB 1000 mg/200 mL premix     1,000 mg 200 mL/hr over 60 Minutes Intravenous  Once 05/27/15 2344 05/28/15 0129        Subjective:   [name] was seen and examined today.  Still coughing a lot, no fevers or chills. Patient denies dizziness, chest pain,  abdominal pain, N/V/D/C, new weakness, numbess, tingling. No acute events overnight.    Objective:   Filed Vitals:   05/31/15 2027 05/31/15 2142 06/01/15 0511 06/01/15 0852  BP:  150/76 131/66   Pulse:  104 87   Temp:  98.2 F (36.8 C) 98.6 F (37 C)   TempSrc:  Oral Oral   Resp:  20 19   Height:      Weight:   86 kg (189 lb 9.5 oz)   SpO2: 98% 99% 97% 100%    Intake/Output Summary (Last 24 hours) at 06/01/15 1403 Last data filed at 06/01/15 0837  Gross per 24 hour  Intake    240 ml  Output   1035 ml  Net   -795 ml     Wt Readings from Last 3 Encounters:  06/01/15 86 kg (189 lb 9.5 oz)  05/12/15 83.008 kg (183 lb)  05/10/15 80.74 kg (178 lb)     Exam  General: Alert and oriented x 3, NAD  HEENT:  PERRLA, EOMI, Anicteric Sclera, mucous membranes moist.   Neck: Supple, no JVD, no masses  CVS: S1 S2 auscultated, no rubs, murmurs or gallops. Regular rate and rhythm.  Respiratory: Mild scattered wheezing  Abdomen: Soft, nontender, nondistended, + bowel sounds  Ext: no cyanosis clubbing or edema  Neuro: AAOx3, Cr N's II- XII. Strength 5/5 upper and lower extremities bilaterally  Skin: No rashes  Psych: Normal affect and demeanor, alert and oriented x3    Data Reviewed:  I have personally reviewed following labs and imaging studies  Micro Results Recent Results (from the  past 240 hour(s))  Blood Culture (routine x 2)     Status: None (Preliminary result)   Collection Time: 05/27/15 11:50 PM  Result Value Ref Range Status   Specimen Description BLOOD LEFT ANTECUBITAL  Final   Special Requests BOTTLES DRAWN AEROBIC AND ANAEROBIC 5CC  Final   Culture NO GROWTH 4 DAYS  Final   Report Status PENDING  Incomplete  Blood Culture (routine x 2)     Status: None (Preliminary result)   Collection Time: 05/28/15 12:25 AM  Result Value Ref Range Status   Specimen Description BLOOD LEFT FOREARM  Final   Special Requests BOTTLES DRAWN AEROBIC AND ANAEROBIC 10CC  Final   Culture NO GROWTH 4 DAYS  Final   Report Status PENDING  Incomplete  Respiratory virus panel     Status: Abnormal   Collection Time: 05/28/15  2:05 AM  Result Value Ref Range Status   Respiratory Syncytial Virus A Negative Negative Final   Respiratory Syncytial Virus B Negative Negative Final   Influenza A Negative Negative Final   Influenza B Positive (A) Negative Final   Parainfluenza 1 Negative Negative Final   Parainfluenza 2 Negative Negative Final   Parainfluenza 3 Negative Negative Final   Metapneumovirus Negative Negative Final   Rhinovirus Negative Negative Final   Adenovirus Negative Negative Final    Comment: (NOTE) Performed At: [facility] [address] [place], [place] [id number] Mila Homer MD VO:[id number]   Urine culture     Status: None   Collection Time: 05/28/15  2:33 AM  Result Value Ref Range Status   Specimen Description URINE, CLEAN CATCH  Final   Special Requests NONE  Final   Culture 5,000 COLONIES/mL INSIGNIFICANT GROWTH  Final   Report Status 05/29/2015 FINAL  Final  MRSA PCR Screening     Status: None   Collection Time: 05/28/15 12:54 PM  Result Value Ref Range Status   MRSA by PCR NEGATIVE NEGATIVE Final    Comment:        The GeneXpert MRSA Assay (FDA approved for NASAL specimens only), is one component of a comprehensive MRSA  colonization surveillance program. It is not intended to diagnose MRSA infection nor to guide or monitor treatment for MRSA infections.   Culture, sputum-assessment     Status: None   Collection Time: 05/29/15 11:20 PM  Result Value Ref Range Status   Specimen Description SPUTUM  Final   Special Requests NONE  Final   Sputum evaluation THIS SPECIMEN IS ACCEPTABLE FOR SPUTUM CULTURE  Final   Report Status 05/30/2015 FINAL  Final  Culture, respiratory (NON-Expectorated)     Status: None   Collection Time: 05/29/15 11:20 PM  Result Value Ref Range Status   Specimen Description SPUTUM  Final   Special Requests NONE  Final   Gram Stain   Final    ABUNDANT WBC PRESENT,BOTH PMN AND MONONUCLEAR FEW SQUAMOUS EPITHELIAL CELLS PRESENT FEW GRAM POSITIVE COCCI IN PAIRS FEW GRAM NEGATIVE RODS THIS SPECIMEN IS ACCEPTABLE FOR SPUTUM CULTURE Performed at [facility]    Culture   Final    NORMAL OROPHARYNGEAL FLORA Performed at [facility]    Report Status 06/01/2015 FINAL  Final    Radiology Reports Dg Chest 2 View  05/27/2015  CLINICAL DATA:  65 year old male with shortness of breath EXAM: CHEST  2 VIEW COMPARISON:  Radiograph dated 05/11/2015 FINDINGS: Two views of the chest demonstrate an area of minimal hazy density in the right lower lung field, possibly atelectatic changes. Developing pneumonia is less likely but not excluded. Clinical correlation is recommended. There is no focal consolidation, pleural effusion, or pneumothorax. There is mild eventration of the right hemidiaphragm. The cardiac silhouette is within normal limits. No acute osseous pathology identified. IMPRESSION: Minimal hazy density in the right lung base, likely atelectatic changes. No focal consolidation.  Electronically Signed   By: Elgie Collard M.D.   On: 05/27/2015 22:59   Dg Chest Port 1 View  05/11/2015  CLINICAL DATA:  Shortness of breath, wheezing, and abdominal pain tonight. EXAM: PORTABLE  CHEST 1 VIEW COMPARISON:  04/25/2015 FINDINGS: The heart size and mediastinal contours are within normal limits. Both lungs are clear. The visualized skeletal structures are unremarkable. IMPRESSION: No active disease. Electronically Signed   By: Burman Nieves M.D.   On: 05/11/2015 05:10   Ct Angio Abd/pel W/ And/or W/o  05/28/2015  CLINICAL DATA:  65 year old male with abdominal pain and distention. Recent diagnosis of influenza B EXAM: CTA ABDOMEN AND PELVIS wITHOUT AND WITH CONTRAST TECHNIQUE: Multidetector CT imaging of the abdomen and pelvis was performed using the standard protocol during bolus administration of intravenous contrast. Multiplanar reconstructed images and MIPs were obtained and reviewed to evaluate the vascular anatomy. CONTRAST:  100 mL Isovue 370 COMPARISON:  Prior CT scan of the abdomen and pelvis 07/17/2011 FINDINGS: VASCULAR Aorta: Normal caliber abdominal aorta without evidence of aneurysm or dissection. Peripheral calcified atherosclerotic plaque without significant stenosis or irregularity. Celiac: Widely patent. Conventional hepatic arterial anatomy. No visceral artery aneurysm. SMA: Widely patent and unremarkable. Renals: Single renal arteries bilaterally which are widely patent. No fibromuscular dysplasia. IMA: Widely patent. Inflow: Bulky calcified atherosclerotic plaque results in probable moderate stenosis of the proximal and mid left common iliac artery. High-grade stenosis of the origin of the left internal iliac artery. At least moderate stenosis of the origin of the right internal iliac artery. Bulky calcified plaque continues throughout the external iliac arteries. No significant focal stenosis. Proximal Outflow: At least moderate stenosis of the right common femoral artery. Significant disease noted in the visualized portions of both superficial femoral arteries. Veins: No focal venous abnormality. NON-VASCULAR Lower Chest: The heart is within normal limits for size. No  pericardial effusion. Unremarkable distal thoracic esophagus. Minimal dependent atelectasis. Otherwise the lung bases are clear. Abdomen: Unremarkable CT appearance of the stomach, duodenum, spleen, adrenal glands and pancreas. Abnormal appearance of the liver with relative hypertrophy of the left hepatic lobe, a faintly nodular contour and perhaps mildly hypo attenuating hepatic parenchyma. No discrete hepatic lesions. Gallbladder is unremarkable. No intra or extrahepatic biliary ductal dilatation. Unremarkable appearance of the bilateral kidneys. No focal solid lesion, hydronephrosis or nephrolithiasis. Tiny low-attenuation lesions bilaterally are too small for accurate characterization but are statistically highly likely benign cysts. No evidence of obstruction or focal bowel wall thickening. Normal appendix in the right lower quadrant. The terminal ileum is unremarkable. No free fluid or suspicious adenopathy. Pelvis: Unremarkable bladder, prostate gland and seminal vesicles. No free fluid or suspicious adenopathy. Bones/Soft Tissues: No acute fracture or aggressive appearing lytic or blastic osseous lesion. Review of the MIP images confirms the above findings. IMPRESSION: VASCULAR 1. No acute vascular abnormality. 2. Widely patent visceral vessels. No evidence of acute or chronic mesenteric ischemia. 3. Significant peripheral arterial disease with at least moderate stenoses of the left common iliac artery, right common femoral artery and bilateral superficial femoral arteries. Does the patient have a clinical history of claudication? NON VASCULAR 1. Suspect early cirrhotic change in the liver as well as mild hypoattenuation concerning for underlying steatosis. Recommend clinical correlation with serum LFTs. 2. No acute abnormality in the abdomen or pelvis. Signed, Sterling Big, MD [facility] Radiology Electronically Signed   By: Malachy Moan M.D.    On: 05/28/2015 12:52    CBC  Recent Labs Lab 05/27/15 2315 06/01/15 0919  WBC 9.3 5.6  HGB 13.4 12.7*  HCT 41.3 39.1  PLT 227 193  MCV 86.2 84.1  MCH 28.0 27.3  MCHC 32.4 32.5  RDW 14.1 13.5  LYMPHSABS 1.1  --   MONOABS 0.3  --   EOSABS 0.1  --   BASOSABS 0.0  --     Chemistries   Recent Labs Lab 05/27/15 2315 06/01/15 0919  NA 137 133*  K 3.9 3.8  CL 100* 99*  CO2 19* 25  GLUCOSE 229* 278*  BUN 11 18  CREATININE 1.09 0.91  CALCIUM 8.7* 8.3*   ------------------------------------------------------------------------------------------------------------------ estimated creatinine clearance is 85.4 mL/min (by C-G formula based on Cr of 0.91). ------------------------------------------------------------------------------------------------------------------ No results for input(s): HGBA1C in the last 72 hours. ------------------------------------------------------------------------------------------------------------------ No results for input(s): CHOL, HDL, LDLCALC, TRIG, CHOLHDL, LDLDIRECT in the last 72 hours. ------------------------------------------------------------------------------------------------------------------ No results for input(s): TSH, T4TOTAL, T3FREE, THYROIDAB in the last 72 hours.  Invalid input(s): FREET3 ------------------------------------------------------------------------------------------------------------------ No results for input(s): VITAMINB12, FOLATE, FERRITIN, TIBC, IRON, RETICCTPCT in the last 72 hours.  Coagulation profile  Recent Labs Lab 05/27/15 0154  INR 1.08    No results for input(s): DDIMER in the last 72 hours.  Cardiac Enzymes No results for input(s): CKMB, TROPONINI, MYOGLOBIN in the last 168 hours.  Invalid input(s): CK ------------------------------------------------------------------------------------------------------------------ Invalid input(s): POCBNP   Recent Labs  05/31/15 0729 05/31/15 1124  05/31/15 1647 05/31/15 2139 06/01/15 0722 06/01/15 1124  GLUCAP 221* 296* 203* 233* 69 191*     Kamrynn Melott M.D. [facility] 06/01/2015, 2:03 PM  Pager: [phone] Between 7am to 7pm - call Pager - [phone]  After 7pm go to [email or web address] - password [email or web address]  Call night coverage person covering after 7pm

## 2015-06-01 NOTE — Plan of Care (Signed)
Problem: Phase II Progression Outcomes Goal: Activity at appropriate level-compared to baseline (UP IN CHAIR FOR HEMODIALYSIS)  Outcome: Progressing Pt able to ambulate in hall with no difficulty.

## 2015-06-01 NOTE — Progress Notes (Signed)
Inpatient Diabetes Program Recommendations  AACE/ADA: New Consensus Statement on Inpatient Glycemic Control (2015)  Target Ranges:  Prepandial:   less than 140 mg/dL      Peak postprandial:   less than 180 mg/dL (1-2 hours)      Critically ill patients:  140 - 180 mg/dL   Results for [name], [name] (MRN [id number]) as of 06/01/2015 10:24  Ref. Range 05/31/2015 07:29 05/31/2015 11:24 05/31/2015 16:47 05/31/2015 21:39 06/01/2015 07:22  Glucose-Capillary Latest Ref Range: 65-99 mg/dL 221 (H) 296 (H) 203 (H) 233 (H) 69   Review of Glycemic Control  Diabetes history:  DM 2 Home DM Meds: Lantus 80 units QAM  Metformin 1000 mg bid  Current Insulin Orders: Lantus 60 units QAM,  Novolog Sensitive + HS + Novolog 4 TID meal coverage  Patient's prednisone being tapered, Now at 40 mg Daily. Fasting glucose 69 this am. May need to decrease Lantus back down as PO prednisone is tapered.  Thanks,  Tama Headings RN, MSN, [facility] Inpatient Diabetes Coordinator Team Pager [phone] (8a-5p)

## 2015-06-02 DIAGNOSIS — I10 Essential (primary) hypertension: Secondary | ICD-10-CM

## 2015-06-02 LAB — CULTURE, BLOOD (ROUTINE X 2)
Culture: NO GROWTH
Culture: NO GROWTH

## 2015-06-02 LAB — GLUCOSE, CAPILLARY
Glucose-Capillary: 65 mg/dL (ref 65–99)
Glucose-Capillary: 144 mg/dL — ABNORMAL HIGH (ref 65–99)
Glucose-Capillary: 214 mg/dL — ABNORMAL HIGH (ref 65–99)

## 2015-06-02 MED ORDER — BENZONATATE 200 MG PO CAPS: 200.0000 mg | ORAL_CAPSULE | Freq: Three times a day (TID) | ORAL | Status: DC

## 2015-06-02 MED ORDER — PREDNISONE 10 MG PO TABS: ORAL_TABLET | ORAL | Status: DC

## 2015-06-02 MED ORDER — INSULIN GLARGINE 100 UNIT/ML ~~LOC~~ SOLN: 60.0000 [IU] | Freq: Every day | SUBCUTANEOUS | Status: DC

## 2015-06-02 NOTE — Progress Notes (Signed)
Pt CBG 65 this am, 1 cup of orange juice given, will recheck CBG in 15. Pt has no complaints, asymptomatic.

## 2015-06-02 NOTE — Discharge Summary (Signed)
Physician Discharge Summary   Patient ID: [name] MRN: [id number] DOB/AGE: November 24, 1950 65 y.o.  Admit date: 05/27/2015 Discharge date: 06/02/2015  Primary Care Physician:  Kyle Broker, MD  Discharge Diagnoses:   . Sepsis (HCC) . COPD exacerbation (HCC) . HCAP (healthcare-associated pneumonia) . Acute on chronic respiratory failure with hypoxia (HCC) . Influenza B . Chronic diastolic CHF (congestive heart failure) (HCC) . Essential hypertension, benign . GERD (gastroesophageal reflux disease) . Lactic acidosis Diabetes mellitus, type II, insulin-dependent  Consults:  Critical care service  Recommendations for Outpatient Follow-up:  1. Please repeat CBC/BMET at next visit 2. Lantus decreased to 60 units daily, metformin has been discontinued due to profound lactic acidosis at the time of admission   DIET: Carb modified diet    Allergies:   Allergies  Allergen Reactions  . Crestor [Rosuvastatin] Other (See Comments)    INTOLERANCE   . Mobic [Meloxicam] Rash     DISCHARGE MEDICATIONS: Current Discharge Medication List    START taking these medications   Details  benzonatate (TESSALON) 200 MG capsule Take 1 capsule (200 mg total) by mouth 3 (three) times daily. For cough Qty: 30 capsule, Refills: 0      CONTINUE these medications which have CHANGED   Details  insulin glargine (LANTUS) 100 UNIT/ML injection Inject 0.6 mLs (60 Units total) into the skin daily. Qty: 30 mL, Refills: 2    predniSONE (DELTASONE) 10 MG tablet Prednisone dosing: Take  Prednisone 30mg  (3 tabs) x 3 days, then 20mg  (2 tabs) x 3days, then 10mg  (1 tab) x 3days, then OFF. Qty: 18 tablet, Refills: 0      CONTINUE these medications which have NOT CHANGED   Details  aspirin EC 81 MG tablet Take 1 tablet (81 mg total) by mouth daily. Qty: 30 tablet, Refills: 3    cholecalciferol (VITAMIN D) 1000 UNITS tablet Take 1 tablet (1,000 Units total) by mouth daily. Qty: 30 tablet,  Refills: 0    Cinnamon 500 MG capsule Take 500 mg by mouth 2 (two) times daily.    Cranberry 500 MG CAPS Take 500 mg by mouth daily.     losartan-hydrochlorothiazide (HYZAAR) 100-12.5 MG per tablet Take 1 tablet by mouth daily. Qty: 30 tablet, Refills: 6    Omega-3 Fatty Acids (FISH OIL) 1000 MG CAPS Take 2 capsules by mouth daily.    omeprazole (PRILOSEC) 40 MG capsule TAKE ONE CAPSULE BY MOUTH DAILY Qty: 90 capsule, Refills: 3    traZODone (DESYREL) 50 MG tablet TAKE ONE TABLET BY MOUTH AT BEDTIME AS NEEDED FOR SLEEP Qty: 90 tablet, Refills: 1    Umeclidinium-Vilanterol 62.5-25 MCG/INH AEPB Inhale 1 puff into the lungs daily. Qty: 60 each, Refills: 6    VENTOLIN HFA 108 (90 Base) MCG/ACT inhaler INHALE TWO PUFFS BY MOUTH EVERY 6 HOURS AS NEEDED FOR WHEEZING FOR SHORTNESS OF BREATH Qty: 54 each, Refills: 0    glucose blood test strip Use as instructed to check blood sugar four times a day. DXE11.09 Qty: 400 each, Refills: 1    guaiFENesin-codeine 100-10 MG/5ML syrup Take 5 mLs by mouth every 6 (six) hours as needed for cough. Qty: 120 mL, Refills: 0    Insulin Pen Needle (NOVOFINE) 32G X 6 MM MISC Use to inject insulin 1 time per day. Qty: 90 each, Refills: 2    Lancets MISC Use to test sugar four times daily. DX E11.09 Qty: 400 each, Refills: 1      STOP taking these medications  metFORMIN (GLUCOPHAGE) 1000 MG tablet          Brief H and P: For complete details please refer to admission H and P, but in brief 65 year old male with history of hypertension, diabetes mellitus on insulin and metformin, COPD not on home O2, asthma, GERD, coronary artery disease, hep C, diastolic CHF, arthritis, peripheral vascular disease was hospitalized one month back for healthcare associated pneumonia presented with progressive shortness of breath for the past 3 days associated with subjective fever at home and cough with occasional whitish phlegm. patient was septic with fever of  102.11F, tachypnea, tachycardia (HR 141) and markedly elevated lactic acid of 6.39 which increased further to 10. Renal function stable. Chest x-ray was unremarkable. Flu PCR was positive for influenza B. Patient was admitted to stepdown for further management.  Hospital Course:   Severe sepsis with acute hypoxic respiratory failuresecondary to influenza B and COPD exacerbation. The patient presented with a lactic acid of 10.4. Sepsis pathway was initiated in the ED and patient was given aggressive IV hydration. He was started on IV Solu-Medrol, scheduled DuoNeb's, empiric Levaquin and Tamiflu. -Severe cough caused significant discomfort, improving with cough suppressant and Tessalon Pearls. -Transition to oral prednisone with taper. Patient is ambulating in the hallway without any difficulty. The patient has completed full course of Tamiflu and Levaquin   Profound Lactic acidosis Suspect this is partly due to sepsis and due to metformin. CT abdomen and pelvis done was unremarkable. Discontinued metformin. (Should be off of it completely) Now improved.   COPD with exacerbation - quit smoking 2 years back. Uses albuterol inhaler only.  -Needs to establish outpatient care with pulmonary and PFT. (appointment made to tammy parrett , NP on 06/06/2015 at 3pm)    Essential hypertension Restarted losartan-HCTZ  Chronic diastolic CHF/CAD status peripheral vascular disease euvolemic. Continue aspirin  Uncontrolled type 2 diabetes mellitus Discontinue metformin. A1c from January 2017 of 10.6 - Continue lantus 60 units daily, patient is to follow-up with PCP, but just insulin regimen. May benefit from endocrinology referral.  GERD Continue PPI   Day of Discharge BP 158/87 mmHg  Pulse 83  Temp(Src) 98.4 F (36.9 C) (Oral)  Resp 18  Ht 5\' 6"  (1.676 m)  Wt 85.186 kg (187 lb 12.8 oz)  BMI 30.33 kg/m2  SpO2 98%  Physical Exam: General: Alert and awake oriented x3 not in any  acute distress. HEENT: anicteric sclera, pupils reactive to light and accommodation CVS: S1-S2 clear no murmur rubs or gallops Chest: clear to auscultation bilaterally, no wheezing rales or rhonchi Abdomen: soft nontender, nondistended, normal bowel sounds Extremities: no cyanosis, clubbing or edema noted bilaterally Neuro: Cranial nerves II-XII intact, no focal neurological deficits   The results of significant diagnostics from this hospitalization (including imaging, microbiology, ancillary and laboratory) are listed below for reference.    LAB RESULTS: Basic Metabolic Panel:  Recent Labs Lab 05/27/15 2315 06/01/15 0919  NA 137 133*  K 3.9 3.8  CL 100* 99*  CO2 19* 25  GLUCOSE 229* 278*  BUN 11 18  CREATININE 1.09 0.91  CALCIUM 8.7* 8.3*   Liver Function Tests: No results for input(s): AST, ALT, ALKPHOS, BILITOT, PROT, ALBUMIN in the last 168 hours. No results for input(s): LIPASE, AMYLASE in the last 168 hours. No results for input(s): AMMONIA in the last 168 hours. CBC:  Recent Labs Lab 05/27/15 2315 06/01/15 0919  WBC 9.3 5.6  NEUTROABS 7.7  --   HGB 13.4 12.7*  HCT 41.3 39.1  MCV 86.2 84.1  PLT 227 193   Cardiac Enzymes: No results for input(s): CKTOTAL, CKMB, CKMBINDEX, TROPONINI in the last 168 hours. BNP: Invalid input(s): POCBNP CBG:  Recent Labs Lab 06/02/15 0833 06/02/15 1215  GLUCAP 144* 214*    Significant Diagnostic Studies:  Dg Chest 2 View  05/27/2015  CLINICAL DATA:  65 year old male with shortness of breath EXAM: CHEST  2 VIEW COMPARISON:  Radiograph dated 05/11/2015 FINDINGS: Two views of the chest demonstrate an area of minimal hazy density in the right lower lung field, possibly atelectatic changes. Developing pneumonia is less likely but not excluded. Clinical correlation is recommended. There is no focal consolidation, pleural effusion, or pneumothorax. There is mild eventration of the right hemidiaphragm. The cardiac silhouette is  within normal limits. No acute osseous pathology identified. IMPRESSION: Minimal hazy density in the right lung base, likely atelectatic changes. No focal consolidation. Electronically Signed   By: Elgie Collard M.D.   On: 05/27/2015 22:59   Ct Angio Abd/pel W/ And/or W/o  05/28/2015  CLINICAL DATA:  65 year old male with abdominal pain and distention. Recent diagnosis of influenza B EXAM: CTA ABDOMEN AND PELVIS wITHOUT AND WITH CONTRAST TECHNIQUE: Multidetector CT imaging of the abdomen and pelvis was performed using the standard protocol during bolus administration of intravenous contrast. Multiplanar reconstructed images and MIPs were obtained and reviewed to evaluate the vascular anatomy. CONTRAST:  100 mL Isovue 370 COMPARISON:  Prior CT scan of the abdomen and pelvis 07/17/2011 FINDINGS: VASCULAR Aorta: Normal caliber abdominal aorta without evidence of aneurysm or dissection. Peripheral calcified atherosclerotic plaque without significant stenosis or irregularity. Celiac: Widely patent. Conventional hepatic arterial anatomy. No visceral artery aneurysm. SMA: Widely patent and unremarkable. Renals: Single renal arteries bilaterally which are widely patent. No fibromuscular dysplasia. IMA: Widely patent. Inflow: Bulky calcified atherosclerotic plaque results in probable moderate stenosis of the proximal and mid left common iliac artery. High-grade stenosis of the origin of the left internal iliac artery. At least moderate stenosis of the origin of the right internal iliac artery. Bulky calcified plaque continues throughout the external iliac arteries. No significant focal stenosis. Proximal Outflow: At least moderate stenosis of the right common femoral artery. Significant disease noted in the visualized portions of both superficial femoral arteries. Veins: No focal venous abnormality. NON-VASCULAR Lower Chest: The heart is within normal limits for size. No pericardial effusion. Unremarkable distal  thoracic esophagus. Minimal dependent atelectasis. Otherwise the lung bases are clear. Abdomen: Unremarkable CT appearance of the stomach, duodenum, spleen, adrenal glands and pancreas. Abnormal appearance of the liver with relative hypertrophy of the left hepatic lobe, a faintly nodular contour and perhaps mildly hypo attenuating hepatic parenchyma. No discrete hepatic lesions. Gallbladder is unremarkable. No intra or extrahepatic biliary ductal dilatation. Unremarkable appearance of the bilateral kidneys. No focal solid lesion, hydronephrosis or nephrolithiasis. Tiny low-attenuation lesions bilaterally are too small for accurate characterization but are statistically highly likely benign cysts. No evidence of obstruction or focal bowel wall thickening. Normal appendix in the right lower quadrant. The terminal ileum is unremarkable. No free fluid or suspicious adenopathy. Pelvis: Unremarkable bladder, prostate gland and seminal vesicles. No free fluid or suspicious adenopathy. Bones/Soft Tissues: No acute fracture or aggressive appearing lytic or blastic osseous lesion. Review of the MIP images confirms the above findings. IMPRESSION: VASCULAR 1. No acute vascular abnormality. 2. Widely patent visceral vessels. No evidence of acute or chronic mesenteric ischemia. 3. Significant peripheral arterial disease with at least moderate stenoses of the left common iliac artery,  right common femoral artery and bilateral superficial femoral arteries. Does the patient have a clinical history of claudication? NON VASCULAR 1. Suspect early cirrhotic change in the liver as well as mild hypoattenuation concerning for underlying steatosis. Recommend clinical correlation with serum LFTs. 2. No acute abnormality in the abdomen or pelvis. Signed, Sterling Big, MD [facility] Electronically Signed   By: Malachy Moan M.D.   On: 05/28/2015 12:52    2D  ECHO:   Disposition and Follow-up: Discharge Instructions    (HEART FAILURE PATIENTS) Call MD:  Anytime you have any of the following symptoms: 1) 3 pound weight gain in 24 hours or 5 pounds in 1 week 2) shortness of breath, with or without a dry hacking cough 3) swelling in the hands, feet or stomach 4) if you have to sleep on extra pillows at night in order to breathe.    Complete by:  As directed      Diet Carb Modified    Complete by:  As directed      Increase activity slowly    Complete by:  As directed             DISPOSITION: Home   DISCHARGE FOLLOW-UP Follow-up Information    Follow up with Rubye Oaks, NP On 06/07/2015.   Specialty:  Pulmonary Disease   Why:  3 pm   Contact information:   [address] [place] [address] [phone]       Follow up with Kyle Broker, MD. Schedule an appointment as soon as possible for a visit in 2 weeks.   Specialty:  Internal Medicine   Why:  for hospital follow-up   Contact information:   [address] [place] [address] [phone]        Time spent on Discharge: 35 minutes  Signed:   Roxie Gueye M.D. [facility] 06/02/2015, 12:37 PM Pager: [phone]

## 2015-06-02 NOTE — Progress Notes (Signed)
Discharge teaching and instructions reviewed. Prescriptions given. VSS. Pt discharging home via bus.

## 2015-06-03 ENCOUNTER — Other Ambulatory Visit: Payer: Self-pay | Admitting: *Deleted

## 2015-06-03 DIAGNOSIS — J441 Chronic obstructive pulmonary disease with (acute) exacerbation: Secondary | ICD-10-CM

## 2015-06-03 DIAGNOSIS — E1169 Type 2 diabetes mellitus with other specified complication: Secondary | ICD-10-CM

## 2015-06-03 NOTE — Patient Outreach (Signed)
Member discharged from hospital yesterday after being readmitted with pneumonia.  Member reports that he is doing  "better."  He reports following all instructions after original discharge, and expresses concern for still having to be readmitted to the hospital for pneumonia.  He reports he is still on prednisone, which is causing his blood sugar to remain elevated, but has completed another course of antibiotics while in the hospital.  He continues to cough, but state he has been taking guaifenesin with codeine, with relief.  He voices concern for not being able to afford some of his medications next week because he has to have a copay for specialist appointments he has within the next couple weeks.  He agrees to have the pharmacist review his medications and assess for extra help.    He denies any concerns at this time.  He is aware of the COPD zones and action plan, and is aware of his triggers.  He denies any shortness of breath at this time.  Encouraged to contact this care manager with any questions.  Will continue with weekly transition of care calls next week.  Valente David, BSN, [place] [facility]  [facility] Care Manager [phone]

## 2015-06-06 ENCOUNTER — Other Ambulatory Visit: Payer: Self-pay | Admitting: Pharmacist

## 2015-06-06 NOTE — Patient Outreach (Signed)
[facility]) Care Management  06/06/2015  VERDON GRIMAUD 06/19/1950 [id number]  Patient was referred to [facility] by Brayton Layman, [place], for medication assistance following hospital discharge.  Pharmacist called and left HIPAA compliant message for patient to return call.  Plan:  Pharmacist will attempt to reach patient tomorrow if no call back today (06/06/15).   Karrie Meres, PharmD, [place] [phone]

## 2015-06-07 ENCOUNTER — Ambulatory Visit: Payer: Commercial Managed Care - HMO | Admitting: Endocrinology

## 2015-06-07 ENCOUNTER — Other Ambulatory Visit: Payer: Self-pay | Admitting: Pharmacist

## 2015-06-07 ENCOUNTER — Ambulatory Visit (INDEPENDENT_AMBULATORY_CARE_PROVIDER_SITE_OTHER): Payer: Commercial Managed Care - HMO | Admitting: Adult Health

## 2015-06-07 ENCOUNTER — Encounter: Payer: Self-pay | Admitting: Adult Health

## 2015-06-07 ENCOUNTER — Ambulatory Visit (INDEPENDENT_AMBULATORY_CARE_PROVIDER_SITE_OTHER)
Admission: RE | Admit: 2015-06-07 | Discharge: 2015-06-07 | Disposition: A | Discharge status: Home / Self Care | Payer: Commercial Managed Care - HMO | Source: Ambulatory Visit | Attending: Adult Health | Admitting: Adult Health

## 2015-06-07 VITALS — BP 116/80 | HR 102 | Temp 98.0°F | Ht 66.0 in | Wt 179.0 lb

## 2015-06-07 DIAGNOSIS — J189 Pneumonia, unspecified organism: Secondary | ICD-10-CM

## 2015-06-07 DIAGNOSIS — R05 Cough: Secondary | ICD-10-CM | POA: Diagnosis not present

## 2015-06-07 DIAGNOSIS — J441 Chronic obstructive pulmonary disease with (acute) exacerbation: Secondary | ICD-10-CM

## 2015-06-07 NOTE — Assessment & Plan Note (Signed)
Clinically improving  Check cxr today   Plan  hest xray today .  Finish prednisone taper as directed.  Continue on BREO .daily , rinse after .  follow up .Dr [place] Bells in 6-8 weeks and As needed   Please contact office for sooner follow up if symptoms do not improve or worsen or seek emergency care

## 2015-06-07 NOTE — Patient Outreach (Signed)
[name]) Care Management  06/07/2015  [name] 1950-04-15 [id number]   [facility] Pharmacist attempted to reach patient regarding patient's medication assistance needs following his recent hospital discharge.  No answer, and pharmacist left a HIPAA compliant message asking for return call.  This was the second attempt.    Will continue to attempt to reach patient.    Karrie Meres, PharmD, [place] [phone]

## 2015-06-07 NOTE — Addendum Note (Signed)
Addended by: Osa Craver on: 06/07/2015 03:31 PM   Modules accepted: Orders

## 2015-06-07 NOTE — Patient Instructions (Signed)
Chest xray today .  Finish prednisone taper as directed.  Continue on BREO .daily , rinse after .  follow up .Dr Lake Bells in 6-8 weeks and As needed   Please contact office for sooner follow up if symptoms do not improve or worsen or seek emergency care

## 2015-06-07 NOTE — Progress Notes (Signed)
Subjective:    Patient ID: [name], male    DOB: 18-Oct-1950, 65 y.o.   MRN: [id number]  HPI 65 yo male former smoker with COPD and Hep C   06/07/2015 Post Hospital follow up  Pt returns for a post hospital follow up .  Was recently admitted for COPD exacerbation, Sepsis , HCAP and Influenza.  tx w/ IV abx, steroids and Tamiflu .  CXR showed R Basilar atx/density.  He is feeling a lot better  with less cough and DOE Some lingering yellow mucus No fever, hemopytsis, chest pain, increased edema.  On BREO daily  Pneumovax utd.  Denies chest pain, orthopnea, edema or fever.  Has few days of prednisone taper .  Appetite is very good. No n/v.d/ .    Past Medical History  Diagnosis Date  . Hypertension   . Coronary artery disease   . High cholesterol   . COPD (chronic obstructive pulmonary disease) (HCC)   . Shortness of breath dyspnea   . Asthma   . Pneumonia 03/2015  . Type II diabetes mellitus (HCC)   . Hepatitis C   . Arthritis     "knees, hands" (03/08/2015)  . Chronic diastolic CHF (congestive heart failure) (HCC)     notes 03/08/2015  . Chronic bronchitis (HCC)    Current Outpatient Prescriptions on File Prior to Visit  Medication Sig Dispense Refill  . aspirin EC 81 MG tablet Take 1 tablet (81 mg total) by mouth daily. 30 tablet 3  . benzonatate (TESSALON) 200 MG capsule Take 1 capsule (200 mg total) by mouth 3 (three) times daily. For cough 30 capsule 0  . cholecalciferol (VITAMIN D) 1000 UNITS tablet Take 1 tablet (1,000 Units total) by mouth daily. (Patient taking differently: Take 2,000 Units by mouth daily. ) 30 tablet 0  . Cinnamon 500 MG capsule Take 500 mg by mouth 2 (two) times daily.    . Cranberry 500 MG CAPS Take 500 mg by mouth daily.     [name] glucose blood test strip Use as instructed to check blood sugar four times a day. DXE11.09 400 each 1  . guaiFENesin-codeine 100-10 MG/5ML syrup Take 5 mLs by mouth every 6 (six) hours as needed for cough. 120 mL 0  .  insulin glargine (LANTUS) 100 UNIT/ML injection Inject 0.6 mLs (60 Units total) into the skin daily. 30 mL 2  . Insulin Pen Needle (NOVOFINE) 32G X 6 MM MISC Use to inject insulin 1 time per day. 90 each 2  . Lancets MISC Use to test sugar four times daily. DX E11.09 400 each 1  . losartan-hydrochlorothiazide (HYZAAR) 100-12.5 MG per tablet Take 1 tablet by mouth daily. 30 tablet 6  . Omega-3 Fatty Acids (FISH OIL) 1000 MG CAPS Take 2 capsules by mouth daily.    [name] omeprazole (PRILOSEC) 40 MG capsule TAKE ONE CAPSULE BY MOUTH DAILY 90 capsule 3  . predniSONE (DELTASONE) 10 MG tablet Prednisone dosing: Take  Prednisone 30mg  (3 tabs) x 3 days, then 20mg  (2 tabs) x 3days, then 10mg  (1 tab) x 3days, then OFF. 18 tablet 0  . traZODone (DESYREL) 50 MG tablet TAKE ONE TABLET BY MOUTH AT BEDTIME AS NEEDED FOR SLEEP 90 tablet 1  . Umeclidinium-Vilanterol 62.5-25 MCG/INH AEPB Inhale 1 puff into the lungs daily. 60 each 6  . VENTOLIN HFA 108 (90 Base) MCG/ACT inhaler INHALE TWO PUFFS BY MOUTH EVERY 6 HOURS AS NEEDED FOR WHEEZING FOR SHORTNESS OF BREATH 54 each 0  No current facility-administered medications on file prior to visit.     Review of Systems Constitutional:   No  weight loss, night sweats,  Fevers, chills, + fatigue, or  lassitude.  HEENT:   No headaches,  Difficulty swallowing,  Tooth/dental problems, or  Sore throat,                No sneezing, itching, ear ache,  +nasal congestion, post nasal drip,   CV:  No chest pain,  Orthopnea, PND, swelling in lower extremities, anasarca, dizziness, palpitations, syncope.   GI  No heartburn, indigestion, abdominal pain, nausea, vomiting, diarrhea, change in bowel habits, loss of appetite, bloody stools.   Resp:  .  No chest wall deformity  Skin: no rash or lesions.  GU: no dysuria, change in color of urine, no urgency or frequency.  No flank pain, no hematuria   MS:  No joint pain or swelling.  No decreased range of motion.  No back  pain.  Psych:  No change in mood or affect. No depression or anxiety.  No memory loss.         Objective:   Physical Exam Filed Vitals:   06/07/15 1457  BP: 116/80  Pulse: 102  Temp: 98 F (36.7 C)  TempSrc: Oral  Height: 5\' 6"  (1.676 m)  Weight: 179 lb (81.194 kg)  SpO2: 98%    GEN: A/Ox3; pleasant , NAD, elderly   HEENT:  [place]/AT,  EACs-clear, TMs-wnl, NOSE-clear, THROAT-clear, no lesions, no postnasal drip or exudate noted.   NECK:  Supple w/ fair ROM; no JVD; normal carotid impulses w/o bruits; no thyromegaly or nodules palpated; no lymphadenopathy.  RESP  Clear  P & A; w/o, wheezes/ rales/ or rhonchi.no accessory muscle use, no dullness to percussion  CARD:  RRR, no m/r/g  , no peripheral edema, pulses intact, no cyanosis or clubbing.  GI:   Soft & nt; nml bowel sounds; no organomegaly or masses detected.  Musco: Warm bil, no deformities or joint swelling noted.   Neuro: alert, no focal deficits noted.    Skin: Warm, no lesions or rashes   Kamarii Carton NP-C  [facility]  06/07/2015       Assessment & Plan:

## 2015-06-07 NOTE — Progress Notes (Signed)
Reviewed, agree with this plan of care 

## 2015-06-07 NOTE — Assessment & Plan Note (Signed)
Clinically improving   Plan  chest xray today .  Finish prednisone taper as directed.  Continue on BREO .daily , rinse after .  follow up .Dr Lake Bells in 6-8 weeks and As needed   Please contact office for sooner follow up if symptoms do not improve or worsen or seek emergency care

## 2015-06-08 ENCOUNTER — Telehealth: Payer: Self-pay | Admitting: Adult Health

## 2015-06-08 ENCOUNTER — Encounter: Payer: Self-pay | Admitting: Endocrinology

## 2015-06-08 ENCOUNTER — Ambulatory Visit (INDEPENDENT_AMBULATORY_CARE_PROVIDER_SITE_OTHER): Payer: Commercial Managed Care - HMO | Admitting: Endocrinology

## 2015-06-08 VITALS — BP 118/78 | HR 97 | Temp 98.3°F | Ht 66.0 in | Wt 179.0 lb

## 2015-06-08 DIAGNOSIS — Z794 Long term (current) use of insulin: Secondary | ICD-10-CM

## 2015-06-08 DIAGNOSIS — E0801 Diabetes mellitus due to underlying condition with hyperosmolarity with coma: Secondary | ICD-10-CM | POA: Diagnosis not present

## 2015-06-08 DIAGNOSIS — E119 Type 2 diabetes mellitus without complications: Secondary | ICD-10-CM | POA: Diagnosis not present

## 2015-06-08 LAB — POCT GLYCOSYLATED HEMOGLOBIN (HGB A1C): Hemoglobin A1C: 9.9

## 2015-06-08 MED ORDER — INSULIN GLARGINE 100 UNIT/ML SOLOSTAR PEN: 100.0000 [IU] | PEN_INJECTOR | SUBCUTANEOUS | Status: DC

## 2015-06-08 NOTE — Telephone Encounter (Signed)
LVM for pt to return call

## 2015-06-08 NOTE — Patient Instructions (Addendum)
check your blood sugar 4 times per day: before the 3 meals, and at bedtime.  also check if you have symptoms of your blood sugar being too high or too low.  please keep a record of the readings and bring it to your next appointment here (or you can bring the meter itself).  You can write it on any piece of paper.  please call us sooner if your blood sugar goes below 70, or if you have a lot of readings over 200. Please increase the lantus to 100 units each morning, and:   Please stop taking the metformin.   Please call us in 2 days, to tell us how the blood sugar is doing.   Also, call us when you next go on the steroids, so we can adjust the insulin.   Please come back for a follow-up appointment in 3 weeks.

## 2015-06-08 NOTE — Progress Notes (Signed)
Subjective:    Patient ID: [name], male    DOB: Jun 25, 1950, 65 y.o.   MRN: [id number]  HPI Pt returns for f/u of diabetes mellitus: DM type: Insulin-requiring type 2 Dx'ed: [id number] Complications: PAD Therapy: insulin since 2016.  DKA: never Severe hypoglycemia: never Pancreatitis: never Other: he chooses qd insulin; he is chronically off and on prednisone, for asthma.   Interval history: Pt says he has COPD exac every time prednisone is tapered.  He was restarted on prednisone last week.  Prior to that, he was taking 80 units of lantus qd, and cbg's varied from 90-160.  Since on the prednisone, cbg's are in the 300's.  He now takes 60 units qd.  Meter is downloaded today, and the printout is scanned into the record.  It varies from 80-450.  It is in general higher as the day goes on Past Medical History  Diagnosis Date  . Hypertension   . Coronary artery disease   . High cholesterol   . COPD (chronic obstructive pulmonary disease) ([place])   . Shortness of breath dyspnea   . Asthma   . Pneumonia 03/2015  . Type II diabetes mellitus ([place])   . Hepatitis C   . Arthritis     "knees, hands" (03/08/2015)  . Chronic diastolic CHF (congestive heart failure) ([place])     notes 03/08/2015  . Chronic bronchitis [facility])     Past Surgical History  Procedure Laterality Date  . Esophagogastroduodenoscopy  02/09/2011    Procedure: ESOPHAGOGASTRODUODENOSCOPY (EGD);  Surgeon: Missy Sabins, MD;  Location: [facility] ENDOSCOPY;  Service: Endoscopy;  Laterality: N/A;  . Joint replacement    . Total knee arthroplasty Left 10/22/2009  . Cataract extraction Left 02/2014    Social History   Social History  . Marital Status: Divorced    Spouse Name: N/A  . Number of Children: N/A  . Years of Education: N/A   Occupational History  . Not on file.   Social History Main Topics  . Smoking status: Former Smoker -- 1.50 packs/day for 46 years    Types: Cigarettes    Quit date: 03/19/2013  . Smokeless  tobacco: Never Used  . Alcohol Use: 0.0 oz/week    0 Standard drinks or equivalent per week     Comment: "recovering alcoholic; [id number]"  . Drug Use: No  . Sexual Activity: Not Currently   Other Topics Concern  . Not on file   Social History Narrative    Current Outpatient Prescriptions on File Prior to Visit  Medication Sig Dispense Refill  . aspirin EC 81 MG tablet Take 1 tablet (81 mg total) by mouth daily. 30 tablet 3  . benzonatate (TESSALON) 200 MG capsule Take 1 capsule (200 mg total) by mouth 3 (three) times daily. For cough 30 capsule 0  . cholecalciferol (VITAMIN D) 1000 UNITS tablet Take 1 tablet (1,000 Units total) by mouth daily. (Patient taking differently: Take 2,000 Units by mouth daily. ) 30 tablet 0  . Cinnamon 500 MG capsule Take 500 mg by mouth 2 (two) times daily.    . Cranberry 500 MG CAPS Take 500 mg by mouth daily.     [name] glucose blood test strip Use as instructed to check blood sugar four times a day. DXE11.09 400 each 1  . guaiFENesin-codeine 100-10 MG/5ML syrup Take 5 mLs by mouth every 6 (six) hours as needed for cough. 120 mL 0  . Insulin Pen Needle (NOVOFINE) 32G X 6 MM MISC Use  to inject insulin 1 time per day. 90 each 2  . Lancets MISC Use to test sugar four times daily. DX E11.09 400 each 1  . losartan-hydrochlorothiazide (HYZAAR) 100-12.5 MG per tablet Take 1 tablet by mouth daily. 30 tablet 6  . Multiple Vitamins-Minerals (MULTIVITAMIN WITH MINERALS) tablet Take 1 tablet by mouth daily.    . Omega-3 Fatty Acids (FISH OIL) 1000 MG CAPS Take 2 capsules by mouth daily.    [name] omeprazole (PRILOSEC) 40 MG capsule TAKE ONE CAPSULE BY MOUTH DAILY 90 capsule 3  . predniSONE (DELTASONE) 10 MG tablet Prednisone dosing: Take  Prednisone 30mg  (3 tabs) x 3 days, then 20mg  (2 tabs) x 3days, then 10mg  (1 tab) x 3days, then OFF. 18 tablet 0  . traZODone (DESYREL) 50 MG tablet TAKE ONE TABLET BY MOUTH AT BEDTIME AS NEEDED FOR SLEEP 90 tablet 1  . Umeclidinium-Vilanterol  62.5-25 MCG/INH AEPB Inhale 1 puff into the lungs daily. 60 each 6  . VENTOLIN HFA 108 (90 Base) MCG/ACT inhaler INHALE TWO PUFFS BY MOUTH EVERY 6 HOURS AS NEEDED FOR WHEEZING FOR SHORTNESS OF BREATH 54 each 0   No current facility-administered medications on file prior to visit.    Allergies  Allergen Reactions  . Crestor [Rosuvastatin] Other (See Comments)    INTOLERANCE   . Mobic [Meloxicam] Rash    Family History  Problem Relation Age of Onset  . Stroke Mother   . Diabetes Mother   . Hypertension Mother   . Hyperlipidemia Mother   . Heart disease Father   . Hypertension Father   . Heart attack Father     BP 118/78 mmHg  Pulse 97  Temp(Src) 98.3 F (36.8 C) (Oral)  Ht 5\' 6"  (1.676 m)  Wt 179 lb (81.194 kg)  BMI 28.91 kg/m2  SpO2 94%  Review of Systems He denies hypoglycemia    Objective:   Physical Exam VITAL SIGNS:  See vs page GENERAL: no distress Pulses: dorsalis pedis intact bilat.   MSK: no deformity of the feet CV: trace bilat leg edema Skin:  no ulcer on the feet.  normal color and temp on the feet. Neuro: sensation is intact to touch on the feet   A1c=9.6%    Assessment & Plan:  DM: worse.  CHF In my opinion, the benefits and risks of metformin are both small, so i am ok with d/c COPD: he needs steroid rx, but this is causing wide swings in his insulin reqirement.    Patient is advised the following: Patient Instructions  check your blood sugar 4 times per day: before the 3 meals, and at bedtime.  also check if you have symptoms of your blood sugar being too high or too low.  please keep a record of the readings and bring it to your next appointment here (or you can bring the meter itself).  You can write it on any piece of paper.  please call us sooner if your blood sugar goes below 70, or if you have a lot of readings over 200. Please increase the lantus to 100 units each morning, and:   Please stop taking the metformin.   Please call us in 2  days, to tell us how the blood sugar is doing.   Also, call us when you next go on the steroids, so we can adjust the insulin.   Please come back for a follow-up appointment in 3 weeks.

## 2015-06-08 NOTE — Telephone Encounter (Signed)
Patient returned call, CB [phone], he has a 3:45 appt, call tomorrow if cannot call before this.

## 2015-06-08 NOTE — Telephone Encounter (Signed)
IMPRESSION: No active cardiopulmonary disease.  Per verbal order from TP No sign of Pneumonia  Continue with office recs It symptoms do not improve call our office or seek emergency care  LVM for pt to return call.

## 2015-06-09 ENCOUNTER — Other Ambulatory Visit: Payer: Self-pay | Admitting: Pharmacist

## 2015-06-09 NOTE — Telephone Encounter (Signed)
Results have been explained to patient, pt expressed understanding. Nothing further needed.  

## 2015-06-09 NOTE — Telephone Encounter (Signed)
Patient returned call, CB [phone].

## 2015-06-09 NOTE — Patient Outreach (Signed)
[facility]) Care Management  06/09/2015  [name] 1950-04-26 [id number]  Pharmacist made a third and final phone call attempt to reach patient today regarding medication assistance.  Pharmacist left another HIPAA compliant voice mail requesting call back.    Since this was the third unsuccessful attempt to reach patient, will send patient an outreach letter.  If patient has not contacted pharmacist with in 10 business days, will close pharmacy case.   Pharmacist did place phone call to [place], [facility] RN community to let her know of unsuccessful attempts to reach patient.   Karrie Meres, PharmD, [place] [phone]

## 2015-06-10 ENCOUNTER — Ambulatory Visit: Payer: Self-pay | Admitting: *Deleted

## 2015-06-10 ENCOUNTER — Other Ambulatory Visit: Payer: Self-pay | Admitting: *Deleted

## 2015-06-10 ENCOUNTER — Encounter: Payer: Self-pay | Admitting: Pharmacist

## 2015-06-10 NOTE — Patient Outreach (Signed)
Weekly transition of care call placed to member at only number listed in chart, [phone].  Recording states the number has either been disconnected or changed.  This care manager has been successful reaching member at this number since February.  [facility] pharmacist, Lennette Bihari, has also been unsuccessful in his attempts to contact member.  Outreach letter sent by pharmacist, will await response.  [name], BSN, [place] Management  [facility] Care Manager [phone]

## 2015-06-13 ENCOUNTER — Telehealth: Payer: Self-pay | Admitting: Endocrinology

## 2015-06-13 ENCOUNTER — Other Ambulatory Visit: Payer: Self-pay | Admitting: Pharmacist

## 2015-06-13 NOTE — Telephone Encounter (Signed)
error 

## 2015-06-13 NOTE — Telephone Encounter (Signed)
Reduce the dose to 90 units

## 2015-06-13 NOTE — Telephone Encounter (Signed)
Lvm for pt advised pt to call back with his insulin dose that he is taking at this time.

## 2015-06-13 NOTE — Telephone Encounter (Signed)
Larene [place], Could you fwd to Dr. Dwyane Dee and have him review in Dr. Cordelia Pen absence, Thanks!

## 2015-06-13 NOTE — Telephone Encounter (Signed)
Patient B/S                 Mor  Orlene Plum    Dinner Sat 8 th    [phone]  89

## 2015-06-13 NOTE — Telephone Encounter (Signed)
Pt is on 100 of the insulin he is on

## 2015-06-13 NOTE — Patient Outreach (Signed)
[facility]) Care Management  06/13/2015  [name] 01/11/1951 [id number]                    This is a late entry for 06/10/15.  [name] is a 65 year old male who was referred to [place] by Brayton Layman [place] for medication assistance.    Pharmacist had made three attempts to reach patient and patient called pharmacist back.  Verified name and date of birth of patient over the phone and pharmacist explained purpose of call. Patient reported that he was able to obtain his medications and did not have any medication assistance needs and did not have questions about his medications at time of call.  He reports he has all of his medications now.   Pharmacist would be happy to assist should pharmacy related needs arise in the future.    Pharmacist will close out pharmacy case at this time and alert Brayton Layman, [facility] RN Community of case closure.    Karrie Meres, PharmD, [place] [phone]

## 2015-06-13 NOTE — Telephone Encounter (Deleted)
Patient called stated that

## 2015-06-13 NOTE — Telephone Encounter (Signed)
Please read message below and advise in Dr Cordelia Pen absence. Thank you.

## 2015-06-13 NOTE — Telephone Encounter (Signed)
Requested a call back from the pt to discuss.  

## 2015-06-13 NOTE — Telephone Encounter (Signed)
Please clarify insulin dose

## 2015-06-13 NOTE — Telephone Encounter (Signed)
Attempted to reach the pt. Pt was unavailable. Will try again at a later time.  

## 2015-06-13 NOTE — Telephone Encounter (Signed)
Pt is on 100 units daily of Lantus. Please advise.

## 2015-06-14 NOTE — Telephone Encounter (Signed)
I contacted the pt and advised of instructions by Dr. Dwyane Dee. Pt voiced understanding.

## 2015-06-16 ENCOUNTER — Other Ambulatory Visit: Payer: Self-pay | Admitting: *Deleted

## 2015-06-16 ENCOUNTER — Ambulatory Visit: Payer: Self-pay | Admitting: *Deleted

## 2015-06-16 NOTE — Progress Notes (Signed)
Quick Note:  LVM for pt to return call ______ 

## 2015-06-16 NOTE — Patient Outreach (Signed)
Weekly transition of care call placed to member, no answer, HIPPA compliant voice message left.  Will await call back, will continue with weekly calls next week.  Valente David, BSN, [place] Management  [facility] Care Manager [phone]

## 2015-06-20 ENCOUNTER — Other Ambulatory Visit: Payer: Self-pay | Admitting: *Deleted

## 2015-06-20 NOTE — Patient Outreach (Signed)
Weekly transition of care call placed to member.  He states that he is "blessed, I really feel good."  He reports a decrease in his shortness of breath and state he has not had to use his rescue inhaler as often.  He states he has continued to lose weight, dropping from 188 pounds to 179 pounds, and reports his blood pressure range 120's-130s/80s.  He has completed his initial follow up visit and has another scheduled for this Friday.  He also has follow up appointments scheduled next month with the pulmonologist and endocrinologist.    He reports that while taking prednisone, his insulin was increased to 100 units.  He states he has not tapered himself back down to 60 units since being off prednisone and is in the process of contacting the endocrinologist to inform them.  He reports his blood sugar now being between 80s-140s with today being 138.  He denies any hyper/hypoglycemic episodes.    Discussed a home visit for next week, member state he is doing well and do not feel a home visit is necessary.  He is agreeable to continued transition of care calls.  Will continue with transition of care program next week.  Valente David, BSN, [place] Management  [facility] Care Manager [phone]

## 2015-06-21 NOTE — Progress Notes (Signed)
Quick Note:  Patient returned call. Advised of cxr results / recs as stated by TP. Pt verbalized understanding and denied any questions. ______ 

## 2015-06-21 NOTE — Progress Notes (Signed)
Quick Note:  LVM for pt to return call ______ 

## 2015-06-22 ENCOUNTER — Other Ambulatory Visit: Payer: Self-pay | Admitting: Internal Medicine

## 2015-06-24 ENCOUNTER — Ambulatory Visit: Payer: Commercial Managed Care - HMO

## 2015-06-24 ENCOUNTER — Encounter: Payer: Self-pay | Admitting: Internal Medicine

## 2015-06-24 ENCOUNTER — Ambulatory Visit (INDEPENDENT_AMBULATORY_CARE_PROVIDER_SITE_OTHER): Payer: Commercial Managed Care - HMO | Admitting: Internal Medicine

## 2015-06-24 VITALS — BP 160/90 | HR 87 | Temp 98.1°F | Ht 66.0 in | Wt 184.5 lb

## 2015-06-24 DIAGNOSIS — E119 Type 2 diabetes mellitus without complications: Secondary | ICD-10-CM

## 2015-06-24 DIAGNOSIS — Z0000 Encounter for general adult medical examination without abnormal findings: Secondary | ICD-10-CM

## 2015-06-24 DIAGNOSIS — B182 Chronic viral hepatitis C: Secondary | ICD-10-CM

## 2015-06-24 DIAGNOSIS — Z7189 Other specified counseling: Secondary | ICD-10-CM | POA: Insufficient documentation

## 2015-06-24 DIAGNOSIS — B1920 Unspecified viral hepatitis C without hepatic coma: Secondary | ICD-10-CM | POA: Diagnosis not present

## 2015-06-24 DIAGNOSIS — I2510 Atherosclerotic heart disease of native coronary artery without angina pectoris: Secondary | ICD-10-CM | POA: Diagnosis not present

## 2015-06-24 DIAGNOSIS — Z794 Long term (current) use of insulin: Secondary | ICD-10-CM

## 2015-06-24 MED ORDER — LOSARTAN POTASSIUM-HCTZ 100-12.5 MG PO TABS: 1.0000 | ORAL_TABLET | Freq: Every day | ORAL | Status: DC

## 2015-06-24 NOTE — Assessment & Plan Note (Signed)
Checking quant hep c with reflex genotyping and referral to ID for treatment given the concern for cirrhosis early on the CT abdomen recently.

## 2015-06-24 NOTE — Progress Notes (Signed)
Subjective:   [name] is a 65 y.o. male who presents for Medicare Annual/Subsequent preventive examination.  Review of Systems:  HRA assessment completed during visit; [name]  The Patient was informed that this wellness visit is to identify risk and educate on how to reduce risk for increase disease through lifestyle changes.   ROS deferred to CPE exam with physician Describes health is fair What would make it better?  to get rid of bronchitis and asthma,  Walking 30 to 40 minutes; was walking an hour every day x 6 months;  Is recovering from pneumonia and flu the later being x 2 weeks ago   c/o of recent leg cramps and will discuss with the doctor; Is seeing the "foot doctor" but may need alternative;   Medical and family hx Mother: Stroke; DM; HTN; Hyperlipidemia Father: HD; HTN; MI  Medical issues  HTN controlled with medication  Asthma and COPD; hospitalized 3/24/ wears a mask all the time  Hep c DM2 dx 1995; 22 years/ recent changes in insulin and to fup in 3 weeks  A1c 06/08/2015 9.9 due to prednisone  Was 100 u at discharge and back down to 30 units  Gets frustrated because he can't eat the can food;  Educated regarding protein drinks; smoothies; dtr teaching; Educated regarding processed foods;   Dyslipidemia 12/2014 chol 150; Trig 96; HDL 47; LDL 83; ratio 3   Tobacco quit x 65 yo; 03/2013; discussed LDCT if he chooses; will discuss with pulmonary next month ETOH: no drinking in 30+ years   Medication review/ will discuss tx for Hep C and medication   BMI: 29.7 Diet;  Eats 3 meals; 80 and 90% [place] meat/  Chicken; baked;  Discussed options;   Exercise;  Still recovering from recent illness x 2 weeks ago but still walking x 45 minutes; but has not built up to baseline Will attempt to go back to baseline as he is able    SAFETY; live in small home;  Safety reviewed for the home;  Bathroom safety; no issues Warehouse manager; yes Smoke detectors yes    Firearms safety / doesn't have firearms Driving accidents and seatbelt/ will drive again but does not drive now Sun protection/ maybe in the summer/ yes Stressors 1-5; not noted today  Depression: no  Fall assessment; no Gait assessment  Mobilization and Functional losses in the last year./ walking is less but will get that back  Sleep patterns: sleeps ok; sleeps 4 to 5 hours; Does not get sleepy during the day   Urinary or fecal incontinence reviewed/ no  Counseling: Colonoscopy; DUE; thinks he was 24; would be willing to have another  EKG: 05/2015 Hearing: no issues  Ophthalmology exam; left eye cataract in Dec 2016; right eye June 2017  Neg glaucoma; no diabetic retinopathy  Immunizations:  Zostavax; is a part d benefit and will check cost for copay prior to having Advanced Directive; has forms  Health advice or referrals    Current Care Team reviewed and updated   Cardiac Risk Factors include: advanced age (>94men, >85 women);diabetes mellitus;dyslipidemia;family history of premature cardiovascular disease;hypertension;male gender     Objective:    Vitals: BP 160/90 mmHg  Pulse 87  Temp(Src) 98.1 F (36.7 C) (Oral)  Ht 5\' 6"  (1.676 m)  Wt 184 lb 8 oz (83.689 kg)  BMI 29.79 kg/m2  SpO2 98%  Body mass index is 29.79 kg/(m^2).  Tobacco History  Smoking status  . Former Smoker -- 1.50 packs/day  for 46 years  . Types: Cigarettes  . Quit date: 03/19/2013  Smokeless tobacco  . Never Used     Counseling given: Not Answered   Past Medical History  Diagnosis Date  . Hypertension   . Coronary artery disease   . High cholesterol   . COPD (chronic obstructive pulmonary disease) ([place])   . Shortness of breath dyspnea   . Asthma   . Pneumonia 03/2015  . Type II diabetes mellitus ([place])   . Hepatitis C   . Arthritis     "knees, hands" (03/08/2015)  . Chronic diastolic CHF (congestive heart failure) ([place])     notes 03/08/2015  . Chronic bronchitis [facility])     Past Surgical History  Procedure Laterality Date  . Esophagogastroduodenoscopy  02/09/2011    Procedure: ESOPHAGOGASTRODUODENOSCOPY (EGD);  Surgeon: Missy Sabins, MD;  Location: [facility] ENDOSCOPY;  Service: Endoscopy;  Laterality: N/A;  . Joint replacement    . Total knee arthroplasty Left 10/22/2009  . Cataract extraction Left 02/2014   Family History  Problem Relation Age of Onset  . Stroke Mother   . Diabetes Mother   . Hypertension Mother   . Hyperlipidemia Mother   . Heart disease Father   . Hypertension Father   . Heart attack Father    History  Sexual Activity  . Sexual Activity: Not Currently    Outpatient Encounter Prescriptions as of 06/24/2015  Medication Sig  . aspirin EC 81 MG tablet Take 1 tablet (81 mg total) by mouth daily.  . benzonatate (TESSALON) 200 MG capsule Take 1 capsule (200 mg total) by mouth 3 (three) times daily. For cough  . cholecalciferol (VITAMIN D) 1000 UNITS tablet Take 1 tablet (1,000 Units total) by mouth daily. (Patient taking differently: Take 2,000 Units by mouth daily. )  . Cinnamon 500 MG capsule Take 500 mg by mouth 2 (two) times daily.  . Cranberry 500 MG CAPS Take 500 mg by mouth daily.   [name] glucose blood test strip Use as instructed to check blood sugar four times a day. DXE11.09  . guaiFENesin-codeine 100-10 MG/5ML syrup Take 5 mLs by mouth every 6 (six) hours as needed for cough.  . Insulin Glargine (LANTUS SOLOSTAR) 100 UNIT/ML Solostar Pen Inject 100 Units into the skin every morning. And pen needles 2/day  . Lancets MISC Use to test sugar four times daily. DX E11.09  . losartan-hydrochlorothiazide (HYZAAR) 100-12.5 MG tablet TAKE ONE TABLET BY MOUTH ONCE DAILY  . Multiple Vitamins-Minerals (MULTIVITAMIN WITH MINERALS) tablet Take 1 tablet by mouth daily.  . Omega-3 Fatty Acids (FISH OIL) 1000 MG CAPS Take 2 capsules by mouth daily.  [name] omeprazole (PRILOSEC) 40 MG capsule TAKE ONE CAPSULE BY MOUTH DAILY  . traZODone (DESYREL) 50 MG  tablet TAKE ONE TABLET BY MOUTH AT BEDTIME AS NEEDED FOR SLEEP  . Umeclidinium-Vilanterol 62.5-25 MCG/INH AEPB Inhale 1 puff into the lungs daily.  . VENTOLIN HFA 108 (90 Base) MCG/ACT inhaler INHALE TWO PUFFS BY MOUTH EVERY 6 HOURS AS NEEDED FOR WHEEZING FOR SHORTNESS OF BREATH  . Insulin Pen Needle (NOVOFINE) 32G X 6 MM MISC Use to inject insulin 1 time per day.  . predniSONE (DELTASONE) 10 MG tablet Prednisone dosing: Take  Prednisone 30mg  (3 tabs) x 3 days, then 20mg  (2 tabs) x 3days, then 10mg  (1 tab) x 3days, then OFF. (Patient not taking: Reported on 06/24/2015)   No facility-administered encounter medications on file as of 06/24/2015.    Activities of Daily Living In your  present state of health, do you have any difficulty performing the following activities: 06/24/2015 05/30/2015  Hearing? N N  Vision? N N  Difficulty concentrating or making decisions? N N  Walking or climbing stairs? N N  Dressing or bathing? N N  Doing errands, shopping? N N  Preparing Food and eating ? N -  Using the Toilet? N -  In the past six months, have you accidently leaked urine? N -  Do you have problems with loss of bowel control? N -  Managing your Medications? N -  Managing your Finances? Y -  Housekeeping or managing your Housekeeping? N -    Patient Care Team: Hoyt Koch, MD as PCP - General (Internal Medicine) Valente David, RN as [place] Management   Assessment:     Exercise Activities and Dietary recommendations Current Exercise Habits: Home exercise routine, Type of exercise: walking, Time (Minutes): 45, Frequency (Times/Week): 5, Weekly Exercise (Minutes/Week): 225, Intensity: Moderate  Goals    . patient     Goal is to get credit back and get a car to assist him meeting his needs instead of taking the bus      Fall Risk Fall Risk  06/24/2015 05/10/2015 03/16/2015 03/31/2014 08/20/2012  Falls in the past year? No No No No No   Depression Screen PHQ 2/9  Scores 06/24/2015 05/10/2015 03/16/2015 03/31/2014  PHQ - 2 Score 0 0 0 3  PHQ- 9 Score - - - 6    Cognitive Testing MMSE - Mini Mental State Exam 06/24/2015  Not completed: (No Data)    Immunization History  Administered Date(s) Administered  . Influenza Split 02/10/2011  . Influenza, Seasonal, Injecte, Preservative Fre 12/19/2012  . Influenza,inj,Quad PF,36+ Mos 12/02/2013  . Influenza-Unspecified 01/19/2015  . Pneumococcal Polysaccharide-23 12/02/2013   Screening Tests Health Maintenance  Topic Date Due  . COLONOSCOPY  08/29/2001  . ZOSTAVAX  08/30/2011  . INFLUENZA VACCINE  10/04/2015  . HEMOGLOBIN A1C  12/08/2015  . OPHTHALMOLOGY EXAM  02/17/2016  . FOOT EXAM  06/07/2016  . PNEUMOCOCCAL POLYSACCHARIDE VACCINE (2) 12/03/2018  . TETANUS/TDAP  03/05/2020  . Hepatitis C Screening  Completed  . HIV Screening  Completed      Plan:    During the course of the visit the patient was educated and counseled about the following appropriate screening and preventive services:   Vaccines to include Pneumoccal, Influenza, Hepatitis B, Td, Zostavax, HCV  Electrocardiogram  Cardiovascular Disease  Colorectal cancer screening  Diabetes screening  Prostate Cancer Screening  Glaucoma screening  Nutrition counseling   Smoking cessation counseling  Patient Instructions (the written plan) was given to the patient.    Wynetta Fines, RN  06/24/2015

## 2015-06-24 NOTE — Assessment & Plan Note (Signed)
He is doing better now that he is off steroids and BS 130s in the morning on 30 units of lantus.

## 2015-06-24 NOTE — Progress Notes (Signed)
Medical screening examination/treatment/procedure(s) were performed by non-physician practitioner and as supervising physician I was immediately available for consultation/collaboration. I agree with above. Zakara Parkey A Isaac Lacson, MD 

## 2015-06-24 NOTE — Patient Instructions (Signed)
  [name] , Thank you for taking time to come for your Medicare Wellness Visit. I appreciate your ongoing commitment to your health goals. Please review the following plan we discussed and let me know if I can assist you in the future.   Will discuss Hepatitis c with Dr. Terese [facility] offers free advance directive forms, as well as assistance in completing the forms themselves. For assistance, contact the Spiritual Care Department at [phone], or the Clinical Social Work Department at [phone].   Will explore shingles vaccination Important Safety Information ZOSTAVAX does not protect everyone, so some people who get the vaccine may still get Shingles.  You should not get ZOSTAVAX if you are allergic to any of its ingredients, including gelatin or neomycin, have a weakened immune system, take high doses of steroids, or are pregnant or plan to become pregnant. You should not get ZOSTAVAX to prevent chickenpox.  Talk to your health care professional if you plan to get ZOSTAVAX at the same time as [id number] (Pneumococcal Vaccine Polyvalent) because it may be better to get these vaccines at least 4 weeks apart.  Possible side effects include redness, pain, itching, swelling, hard lump, warmth, or bruising at the injection site, as well as headache.  ZOSTAVAX contains a weakened chickenpox virus. Tell your health care professional if you will be in close contact with newborn infants, someone who may be pregnant and has not had chickenpox or been vaccinated against chickenpox, or someone who has problems with their immune system. Your health care professional can tell you what situations you may need to avoid. You are encouraged to report negative side effects of prescription drugs to the FDA. Visit [employer], or call 1-800-FDA-1088.  Educated to check with insurance regarding coverage of Shingles vaccination on Part D or Part B and may have lower co-pay if provided on the  Part D side    These are the goals we discussed: Goals    . patient     Goal is to get credit back and get a car to assist him meeting his needs instead of taking the bus       This is a list of the screening recommended for you and due dates:  Health Maintenance  Topic Date Due  . Colon Cancer Screening  08/29/2001  . Shingles Vaccine  08/30/2011  . Flu Shot  10/04/2015  . Hemoglobin A1C  12/08/2015  . Eye exam for diabetics  02/17/2016  . Complete foot exam   06/07/2016  . Pneumococcal vaccine (2) 12/03/2018  . Tetanus Vaccine  03/05/2020  .  Hepatitis C: One time screening is recommended by Center for Disease Control  (CDC) for  adults born from [birth year] through 1964.   Completed  . HIV Screening  Completed

## 2015-06-24 NOTE — Progress Notes (Signed)
Pre visit review using our clinic review tool, if applicable. No additional management support is needed unless otherwise documented below in the visit note. 

## 2015-06-24 NOTE — Assessment & Plan Note (Signed)
Suspect that this could be the cause of his leg cramps with walking. He does have noted blockages in the stomach on recent CT scan.

## 2015-06-24 NOTE — Progress Notes (Signed)
   Subjective:    Patient ID: [name], male    DOB: 06/13/1950, 65 y.o.   MRN: [id number]  HPI The patient is a 65 YO man coming in for wellness. Concerned about hepatitis C and if he needs treatment.   PMH, [facility], social history reviewed and updated.   Review of Systems  Constitutional: Negative for fever, activity change, appetite change, fatigue and unexpected weight change.  HENT: Negative.   Eyes: Negative.   Respiratory: Negative for cough, chest tightness and wheezing.   Cardiovascular: Negative for chest pain, palpitations and leg swelling.  Gastrointestinal: Negative for nausea, abdominal pain, diarrhea, constipation and abdominal distention.  Musculoskeletal: Negative.   Skin: Negative.   Neurological: Negative.   Psychiatric/Behavioral: Negative.       Objective:   Physical Exam  Constitutional: He is oriented to person, place, and time. He appears well-developed and well-nourished.  Overweight  HENT:  Head: Normocephalic and atraumatic.  Eyes: EOM are normal.  Neck: Normal range of motion.  Cardiovascular: Normal rate and regular rhythm.   Pulmonary/Chest: Effort normal and breath sounds normal. No respiratory distress. He has no wheezes. He has no rales.  Clear today.   Abdominal: Soft. He exhibits no distension. There is no tenderness. There is no rebound.  Musculoskeletal: He exhibits no edema.  Neurological: He is alert and oriented to person, place, and time. Coordination normal.  Skin: Skin is warm and dry.  Psychiatric: He has a normal mood and affect.   Filed Vitals:   06/24/15 1024  BP: 160/90  Pulse: 87  Temp: 98.1 F (36.7 C)  TempSrc: Oral  Height: 5\' 6"  (1.676 m)  Weight: 184 lb 8 oz (83.689 kg)  SpO2: 98%      Assessment & Plan:

## 2015-06-24 NOTE — Assessment & Plan Note (Addendum)
Checking labs today and adjust as needed. Needs hepatitis B and A immunization status with his hepatitis C and immunization if no immunity. Needs colonoscopy once his breathing is back to normal. Immunizations up to date.

## 2015-06-27 ENCOUNTER — Other Ambulatory Visit: Payer: Self-pay | Admitting: *Deleted

## 2015-06-27 NOTE — Patient Outreach (Signed)
Weekly transition of care call placed to member, he states he is "doing wonderful."  He denies any shortness breath and states that he has not had to use his inhaler as much as the past several weeks.  He reports taking all medications as prescribed, and state he has been checking his blood pressure, weight, and blood sugar daily.  He reports his weight has been consistent, blood sugar today is 147, and reports his blood pressure at 149/89 today.  He is concerned about his blood pressure, reassurance provided, especially since he has not taken his medications yet this morning.  Discussed checking blood pressure again later in the day to confirm that it has decreased with his medications for additional reassurance.  He agrees and state he will start doing so.    He reports that his follow up appointment with his PCP on Friday went "well" and that she voice no concerns.  He does state that in the future, he will be treated for Hepatitis C, but denies it being a "big" concern.  He denies any questions for this care manager, will continue with transition of care calls next week.  Valente David, BSN, [place] [facility]  [facility] Care Manager [phone]

## 2015-07-06 ENCOUNTER — Other Ambulatory Visit: Payer: Self-pay | Admitting: *Deleted

## 2015-07-06 NOTE — Patient Outreach (Signed)
Weekly transition of care call placed to member.  He state that he is "doing really good.  I am much, much better."  He reports that he has been able to increase his exercise significantly without shortness of breath.  He has been monitoring his blood pressure, blood sugar, and weights.  He reports that they all have been consistent, blood sugar today at 113.  He is now back down to 30 units of insulin.  He has follow up appointments scheduled with his PCP, pulmonology, and endocrinologist.    Member made aware that he has now completed his transition of care program.  He denies any further needs, made aware that his case would be closed.  He is aware that he may contact the [facility] office if his needs should change or he is in need of additional resources.  He is also aware that he continues to have access to the 24 hour nurse triage line.  He verbalizes understanding.  Will notify care management assistant and PCP of case closure.  [name], BSN, [place] [facility]  [facility] Care Manager [phone]

## 2015-07-07 ENCOUNTER — Encounter: Payer: Self-pay | Admitting: *Deleted

## 2015-07-08 ENCOUNTER — Encounter: Payer: Self-pay | Admitting: Endocrinology

## 2015-07-08 ENCOUNTER — Ambulatory Visit (INDEPENDENT_AMBULATORY_CARE_PROVIDER_SITE_OTHER): Payer: Commercial Managed Care - HMO | Admitting: Endocrinology

## 2015-07-08 VITALS — BP 144/80 | HR 101 | Temp 98.3°F | Resp 16 | Ht 66.0 in | Wt 186.0 lb

## 2015-07-08 DIAGNOSIS — E119 Type 2 diabetes mellitus without complications: Secondary | ICD-10-CM | POA: Diagnosis not present

## 2015-07-08 DIAGNOSIS — Z794 Long term (current) use of insulin: Secondary | ICD-10-CM | POA: Diagnosis not present

## 2015-07-08 MED ORDER — INSULIN DETEMIR 100 UNIT/ML FLEXPEN: 60.0000 [IU] | PEN_INJECTOR | SUBCUTANEOUS | Status: DC

## 2015-07-08 NOTE — Patient Instructions (Addendum)
check your blood sugar 4 times per day: before the 3 meals, and at bedtime.  also check if you have symptoms of your blood sugar being too high or too low.  please keep a record of the readings and bring it to your next appointment here (or you can bring the meter itself).  You can write it on any piece of paper.  please call us sooner if your blood sugar goes below 70, or if you have a lot of readings over 200. Please change the lantus to "levemir," 60 units each morning.  i have sent a prescription to your pharmacy.     Please call us next week, to tell us how the blood sugar is doing.   Also, call us whenever you go on the steroids, so we can adjust the insulin.   Please come back for a follow-up appointment 1 month.

## 2015-07-08 NOTE — Progress Notes (Signed)
Subjective:    Patient ID: [name], male    DOB: March 29, 1950, 65 y.o.   MRN: [id number]  HPI Pt returns for f/u of diabetes mellitus: DM type: Insulin-requiring type 2 Dx'ed: Q000111Q Complications: PAD Therapy: insulin since 2016.  DKA: never Severe hypoglycemia: never Pancreatitis: never Other: he chooses qd insulin; he is chronically off and on prednisone, for asthma.   Interval history: he has been off prednisone x 3 weeks.  He has been on 30 units qd.  he brings his cbg meter which i have reviewed today.  It varies from 150-300.  It is in general higher as the day goes on.   Past Medical History  Diagnosis Date  . Hypertension   . Coronary artery disease   . High cholesterol   . COPD (chronic obstructive pulmonary disease) ([place])   . Shortness of breath dyspnea   . Asthma   . Pneumonia 03/2015  . Type II diabetes mellitus ([place])   . Hepatitis C   . Arthritis     "knees, hands" (03/08/2015)  . Chronic diastolic CHF (congestive heart failure) ([place])     notes 03/08/2015  . Chronic bronchitis [facility])     Past Surgical History  Procedure Laterality Date  . Esophagogastroduodenoscopy  02/09/2011    Procedure: ESOPHAGOGASTRODUODENOSCOPY (EGD);  Surgeon: Missy Sabins, MD;  Location: [facility] ENDOSCOPY;  Service: Endoscopy;  Laterality: N/A;  . Joint replacement    . Total knee arthroplasty Left 10/22/2009  . Cataract extraction Left 02/2014    Social History   Social History  . Marital Status: Divorced    Spouse Name: N/A  . Number of Children: N/A  . Years of Education: N/A   Occupational History  . Not on file.   Social History Main Topics  . Smoking status: Former Smoker -- 1.50 packs/day for 46 years    Types: Cigarettes    Quit date: 03/19/2013  . Smokeless tobacco: Never Used  . Alcohol Use: 0.0 oz/week    0 Standard drinks or equivalent per week     Comment: "recovering alcoholic; 123XX123"  . Drug Use: No  . Sexual Activity: Not Currently   Other Topics  Concern  . Not on file   Social History Narrative    Current Outpatient Prescriptions on File Prior to Visit  Medication Sig Dispense Refill  . aspirin EC 81 MG tablet Take 1 tablet (81 mg total) by mouth daily. 30 tablet 3  . benzonatate (TESSALON) 200 MG capsule Take 1 capsule (200 mg total) by mouth 3 (three) times daily. For cough 30 capsule 0  . cholecalciferol (VITAMIN D) 1000 UNITS tablet Take 1 tablet (1,000 Units total) by mouth daily. (Patient taking differently: Take 2,000 Units by mouth daily. ) 30 tablet 0  . Cinnamon 500 MG capsule Take 500 mg by mouth 2 (two) times daily.    [name] glucose blood test strip Use as instructed to check blood sugar four times a day. DXE11.09 400 each 1  . guaiFENesin-codeine 100-10 MG/5ML syrup Take 5 mLs by mouth every 6 (six) hours as needed for cough. 120 mL 0  . Insulin Pen Needle (NOVOFINE) 32G X 6 MM MISC Use to inject insulin 1 time per day. 90 each 2  . Lancets MISC Use to test sugar four times daily. DX E11.09 400 each 1  . losartan-hydrochlorothiazide (HYZAAR) 100-12.5 MG tablet Take 1 tablet by mouth daily. 90 tablet 3  . Multiple Vitamins-Minerals (MULTIVITAMIN WITH MINERALS) tablet Take 1  tablet by mouth daily.    . Omega-3 Fatty Acids (FISH OIL) 1000 MG CAPS Take 2 capsules by mouth daily.    [name] omeprazole (PRILOSEC) 40 MG capsule TAKE ONE CAPSULE BY MOUTH DAILY 90 capsule 3  . traZODone (DESYREL) 50 MG tablet TAKE ONE TABLET BY MOUTH AT BEDTIME AS NEEDED FOR SLEEP 90 tablet 1  . Umeclidinium-Vilanterol 62.5-25 MCG/INH AEPB Inhale 1 puff into the lungs daily. 60 each 6  . VENTOLIN HFA 108 (90 Base) MCG/ACT inhaler INHALE TWO PUFFS BY MOUTH EVERY 6 HOURS AS NEEDED FOR WHEEZING FOR SHORTNESS OF BREATH 54 each 0   No current facility-administered medications on file prior to visit.    Allergies  Allergen Reactions  . Crestor [Rosuvastatin] Other (See Comments)    INTOLERANCE   . Mobic [Meloxicam] Rash    Family History  Problem  Relation Age of Onset  . Stroke Mother   . Diabetes Mother   . Hypertension Mother   . Hyperlipidemia Mother   . Heart disease Father   . Hypertension Father   . Heart attack Father     BP 144/80 mmHg  Pulse 101  Temp(Src) 98.3 F (36.8 C) (Oral)  Resp 16  Ht 5\' 6"  (1.676 m)  Wt 186 lb (84.369 kg)  BMI 30.04 kg/m2  SpO2 97%  Review of Systems He denies hypoglycemia    Objective:   Physical Exam VITAL SIGNS:  See vs page GENERAL: no distress Pulses: dorsalis pedis intact bilat.   MSK: no deformity of the feet CV: no leg edema Skin:  no ulcer on the feet.  normal color and temp on the feet. Neuro: sensation is intact to touch on the feet.        Assessment & Plan:  DM: The pattern of his cbg's indicates he needs a faster-acting QD insulin. Asthma: courses of steroids are complicating the rx of DM, but he has no choice but to take. HTN: continue the same medication.  we'll recheck at next ov.  Patient is advised the following: Patient Instructions  check your blood sugar 4 times per day: before the 3 meals, and at bedtime.  also check if you have symptoms of your blood sugar being too high or too low.  please keep a record of the readings and bring it to your next appointment here (or you can bring the meter itself).  You can write it on any piece of paper.  please call us sooner if your blood sugar goes below 70, or if you have a lot of readings over 200. Please change the lantus to "levemir," 60 units each morning.  i have sent a prescription to your pharmacy.     Please call us next week, to tell us how the blood sugar is doing.   Also, call us whenever you go on the steroids, so we can adjust the insulin.   Please come back for a follow-up appointment 1 month.

## 2015-07-11 ENCOUNTER — Telehealth: Payer: Self-pay | Admitting: Endocrinology

## 2015-07-11 MED ORDER — INSULIN DETEMIR 100 UNIT/ML FLEXPEN: 60.0000 [IU] | PEN_INJECTOR | SUBCUTANEOUS | Status: DC

## 2015-07-11 MED ORDER — INSULIN PEN NEEDLE 32G X 6 MM MISC: Status: DC

## 2015-07-11 NOTE — Telephone Encounter (Signed)
The other [facility] is also not having the meds he needs either one will take until 3 pm  He only currently has 10 u of the med and needs assistance

## 2015-07-11 NOTE — Telephone Encounter (Signed)
I contacted the pt and advised I spoke with the [place] at [facility] and was advised the medication is out of stock and would be available tomorrow. I advised the pt since he was going to be out of his insulin today we should send the rx to another [place]. Pt voiced understanding and agreed to sending the rx to [place] on [address]. Rx sent. Pt advised to call if he had any issues pick the insulin or pen needles up.

## 2015-07-11 NOTE — Telephone Encounter (Signed)
I contacted the pt. Pt has gone to two [facility] pharmacy's today and both have told him the Levermir will need to be ordered and he can pick the medication up tomorrow. Pt has left over lantus and is going to take this today until he is able to pick up his levemir tomorrow.

## 2015-07-11 NOTE — Telephone Encounter (Signed)
Pt advised and voiced understanding.   

## 2015-07-11 NOTE — Telephone Encounter (Signed)
Pharmacy does not have the insulin we rx until tomorrow he will be out of the meds he does have and the needles that we called in has no size please advise

## 2015-07-11 NOTE — Telephone Encounter (Signed)
Ok to take a dose of lantus today, and start levemir tomorrow

## 2015-07-12 ENCOUNTER — Telehealth: Payer: Self-pay | Admitting: Endocrinology

## 2015-07-12 MED ORDER — INSULIN ISOPHANE HUMAN 100 UNIT/ML KWIKPEN: 40.0000 [IU] | PEN_INJECTOR | SUBCUTANEOUS | Status: DC

## 2015-07-12 NOTE — Telephone Encounter (Signed)
PT said that he needs to cancel the new Rx for the pen needles and insulin because the new one is higher than the older Rx that he had previously. CB# [phone]

## 2015-07-12 NOTE — Telephone Encounter (Signed)
I contacted the pt. Pt stated the Levemir is too expensive and would like to be placed back on the Lantus. Can we submit the rx?

## 2015-07-12 NOTE — Telephone Encounter (Signed)
According to the pattern of your blood sugar, the lantus is not right for you. i have sent a prescription to your pharmacy, to change to "NPH."   i have prescribed this for you in a pen.  If it is too expensive, we'll need to change it to a vial. Again, as this is a change, we are having to start with a certain amount of insulin, which I'm sure we need to change, so please call if it is below 70 at any time, or often over 200

## 2015-07-13 MED ORDER — INSULIN NPH (HUMAN) (ISOPHANE) 100 UNIT/ML ~~LOC~~ SUSP: 40.0000 [IU] | SUBCUTANEOUS | Status: DC

## 2015-07-13 MED ORDER — "INSULIN SYRINGE 31G X 5/16"" 1 ML MISC": Status: DC

## 2015-07-13 NOTE — Telephone Encounter (Signed)
I contacted the pt and advised of note below and voiced understanding.  

## 2015-07-13 NOTE — Telephone Encounter (Signed)
Pt insurance will only cover the novo brand

## 2015-07-13 NOTE — Telephone Encounter (Signed)
The insulin is too expensive for him at this time please

## 2015-07-13 NOTE — Addendum Note (Signed)
Addended by: Renato Shin on: 07/13/2015 11:11 AM   Modules accepted: Orders, Medications

## 2015-07-13 NOTE — Telephone Encounter (Signed)
I contacted the pt and advised of note below. Pt voiced understanding. He stated he is going to check on the cost and let us know.

## 2015-07-13 NOTE — Addendum Note (Signed)
Addended by: Verlin Grills T on: 07/13/2015 11:28 AM   Modules accepted: Orders

## 2015-07-13 NOTE — Telephone Encounter (Signed)
Ok, i have sent a prescription to your pharmacy, to change. Please note this is only available in a vial

## 2015-07-13 NOTE — Telephone Encounter (Signed)
See note below and please advise if ok to the changed humulin n to novolin n.

## 2015-07-21 ENCOUNTER — Ambulatory Visit: Payer: Commercial Managed Care - HMO | Admitting: Pulmonary Disease

## 2015-08-08 ENCOUNTER — Ambulatory Visit: Payer: Commercial Managed Care - HMO | Admitting: Endocrinology

## 2015-08-09 ENCOUNTER — Ambulatory Visit (INDEPENDENT_AMBULATORY_CARE_PROVIDER_SITE_OTHER): Payer: Commercial Managed Care - HMO | Admitting: Endocrinology

## 2015-08-09 ENCOUNTER — Encounter: Payer: Self-pay | Admitting: Endocrinology

## 2015-08-09 VITALS — BP 132/76 | HR 106 | Temp 98.2°F | Ht 66.0 in | Wt 189.0 lb

## 2015-08-09 DIAGNOSIS — E119 Type 2 diabetes mellitus without complications: Secondary | ICD-10-CM

## 2015-08-09 DIAGNOSIS — Z794 Long term (current) use of insulin: Secondary | ICD-10-CM | POA: Diagnosis not present

## 2015-08-09 LAB — POCT GLYCOSYLATED HEMOGLOBIN (HGB A1C): HEMOGLOBIN A1C: 8.7

## 2015-08-09 NOTE — Patient Instructions (Addendum)
check your blood sugar twice a day.  vary the time of day when you check, between before the 3 meals, and at bedtime.  also check if you have symptoms of your blood sugar being too high or too low.  please keep a record of the readings and bring it to your next appointment here (or you can bring the meter itself).  You can write it on any piece of paper.  please call us sooner if your blood sugar goes below 70, or if you have a lot of readings over 200. Please continue the same NPH insulin.  On this type of insulin schedule, you should eat meals on a regular schedule.  If a meal is missed or significantly delayed, your blood sugar could go low. Also, call us whenever you go on the steroids, so we can adjust the insulin.   Please come back for a follow-up appointment 3 months.

## 2015-08-09 NOTE — Progress Notes (Signed)
Subjective:    Patient ID: [name], male    DOB: 01/25/1951, 65 y.o.   MRN: [id number]  HPI Pt returns for f/u of diabetes mellitus: DM type: Insulin-requiring type 2 Dx'ed: Q000111Q Complications: PAD Therapy: insulin since 2016.   DKA: never Severe hypoglycemia: never Pancreatitis: never Other: he chooses qd insulin; he is chronically off and on prednisone, for asthma; he changed lantus to NPH, due to pattern of cbg's; ins declines levemir.    Interval history: no recent steroids.   he brings his cbg meter which i have reviewed today.  It varies from 92-206.  There is no trend throughout the day. Past Medical History  Diagnosis Date  . Hypertension   . Coronary artery disease   . High cholesterol   . COPD (chronic obstructive pulmonary disease) ([place])   . Shortness of breath dyspnea   . Asthma   . Pneumonia 03/2015  . Type II diabetes mellitus ([place])   . Hepatitis C   . Arthritis     "knees, hands" (03/08/2015)  . Chronic diastolic CHF (congestive heart failure) ([place])     notes 03/08/2015  . Chronic bronchitis [facility])     Past Surgical History  Procedure Laterality Date  . Esophagogastroduodenoscopy  02/09/2011    Procedure: ESOPHAGOGASTRODUODENOSCOPY (EGD);  Surgeon: Missy Sabins, MD;  Location: [facility] ENDOSCOPY;  Service: Endoscopy;  Laterality: N/A;  . Joint replacement    . Total knee arthroplasty Left 10/22/2009  . Cataract extraction Left 02/2014    Social History   Social History  . Marital Status: Divorced    Spouse Name: N/A  . Number of Children: N/A  . Years of Education: N/A   Occupational History  . Not on file.   Social History Main Topics  . Smoking status: Former Smoker -- 1.50 packs/day for 46 years    Types: Cigarettes    Quit date: 03/19/2013  . Smokeless tobacco: Never Used  . Alcohol Use: 0.0 oz/week    0 Standard drinks or equivalent per week     Comment: "recovering alcoholic; 123XX123"  . Drug Use: No  . Sexual Activity: Not Currently    Other Topics Concern  . Not on file   Social History Narrative    Current Outpatient Prescriptions on File Prior to Visit  Medication Sig Dispense Refill  . aspirin EC 81 MG tablet Take 1 tablet (81 mg total) by mouth daily. 30 tablet 3  . benzonatate (TESSALON) 200 MG capsule Take 1 capsule (200 mg total) by mouth 3 (three) times daily. For cough 30 capsule 0  . cholecalciferol (VITAMIN D) 1000 UNITS tablet Take 1 tablet (1,000 Units total) by mouth daily. (Patient taking differently: 2,000 Units daily. ) 30 tablet 0  . Cinnamon 500 MG capsule Take 500 mg by mouth 2 (two) times daily.    [name] glucose blood test strip Use as instructed to check blood sugar four times a day. DXE11.09 400 each 1  . guaiFENesin-codeine 100-10 MG/5ML syrup Take 5 mLs by mouth every 6 (six) hours as needed for cough. 120 mL 0  . insulin NPH Human (NOVOLIN N) 100 UNIT/ML injection Inject 0.4 mLs (40 Units total) into the skin every morning. And syringes 1/day 20 mL 11  . Insulin Pen Needle (NOVOFINE) 32G X 6 MM MISC Use to inject insulin 1 time per day. 90 each 2  . Insulin Syringe-Needle U-100 (INSULIN SYRINGE 1CC/31GX5/16") 31G X 5/16" 1 ML MISC Use to inject insulin 1 time  per day. 90 each 2  . Lancets MISC Use to test sugar four times daily. DX E11.09 400 each 1  . losartan-hydrochlorothiazide (HYZAAR) 100-12.5 MG tablet Take 1 tablet by mouth daily. 90 tablet 3  . Multiple Vitamins-Minerals (MULTIVITAMIN WITH MINERALS) tablet Take 1 tablet by mouth daily.    . Omega-3 Fatty Acids (FISH OIL) 1000 MG CAPS Take 2 capsules by mouth daily.    [name] omeprazole (PRILOSEC) 40 MG capsule TAKE ONE CAPSULE BY MOUTH DAILY 90 capsule 3  . traZODone (DESYREL) 50 MG tablet TAKE ONE TABLET BY MOUTH AT BEDTIME AS NEEDED FOR SLEEP 90 tablet 1  . Umeclidinium-Vilanterol 62.5-25 MCG/INH AEPB Inhale 1 puff into the lungs daily. 60 each 6  . VENTOLIN HFA 108 (90 Base) MCG/ACT inhaler INHALE TWO PUFFS BY MOUTH EVERY 6 HOURS AS NEEDED  FOR WHEEZING FOR SHORTNESS OF BREATH 54 each 0   No current facility-administered medications on file prior to visit.    Allergies  Allergen Reactions  . Crestor [Rosuvastatin] Other (See Comments)    INTOLERANCE   . Mobic [Meloxicam] Rash    Family History  Problem Relation Age of Onset  . Stroke Mother   . Diabetes Mother   . Hypertension Mother   . Hyperlipidemia Mother   . Heart disease Father   . Hypertension Father   . Heart attack Father     BP 132/76 mmHg  Pulse 106  Temp(Src) 98.2 F (36.8 C) (Oral)  Ht 5\' 6"  (1.676 m)  Wt 189 lb (85.73 kg)  BMI 30.52 kg/m2  SpO2 96%  Review of Systems He denies hypoglycemia    Objective:   Physical Exam VITAL SIGNS:  See vs page GENERAL: no distress Pulses: dorsalis pedis intact bilat.  MSK: no deformity of the feet CV: no leg edema Skin: no ulcer on the feet. normal color and temp on the feet. Neuro: sensation is intact to touch on the feet.    A1c=8.7%    Assessment & Plan:  Insulin-requiring type 2 DM, worse: apparently well-controlled when not on steroids Asthma: the steroids are worsening glycemic control.    Patient is advised the following: Patient Instructions  check your blood sugar twice a day.  vary the time of day when you check, between before the 3 meals, and at bedtime.  also check if you have symptoms of your blood sugar being too high or too low.  please keep a record of the readings and bring it to your next appointment here (or you can bring the meter itself).  You can write it on any piece of paper.  please call us sooner if your blood sugar goes below 70, or if you have a lot of readings over 200. Please continue the same NPH insulin.  On this type of insulin schedule, you should eat meals on a regular schedule.  If a meal is missed or significantly delayed, your blood sugar could go low. Also, call us whenever you go on the steroids, so we can adjust the insulin.   Please come back for a  follow-up appointment 3 months.      Renato Shin, MD

## 2015-08-15 ENCOUNTER — Ambulatory Visit: Payer: Commercial Managed Care - HMO | Admitting: Pulmonary Disease

## 2015-08-24 ENCOUNTER — Other Ambulatory Visit: Payer: Commercial Managed Care - HMO

## 2015-08-24 DIAGNOSIS — B182 Chronic viral hepatitis C: Secondary | ICD-10-CM

## 2015-08-24 DIAGNOSIS — K7400 Hepatic fibrosis, unspecified: Secondary | ICD-10-CM

## 2015-08-24 DIAGNOSIS — I2510 Atherosclerotic heart disease of native coronary artery without angina pectoris: Secondary | ICD-10-CM | POA: Diagnosis not present

## 2015-08-25 LAB — AFP TUMOR MARKER: AFP-Tumor Marker: 17.4 ng/mL — ABNORMAL HIGH (ref <6.1)

## 2015-08-25 LAB — HEPATITIS A ANTIBODY, TOTAL: HEP A TOTAL AB: REACTIVE — AB

## 2015-08-25 LAB — HEPATITIS B SURFACE ANTIBODY,QUALITATIVE: HEP B S AB: NEGATIVE

## 2015-08-25 LAB — HEPATITIS B CORE ANTIBODY, TOTAL: HEP B C TOTAL AB: NONREACTIVE

## 2015-08-25 LAB — HEPATITIS B SURFACE ANTIGEN: HEP B S AG: NEGATIVE

## 2015-08-27 LAB — LIVER FIBROSIS, FIBROTEST-ACTITEST
ALPHA-2-MACROGLOBULIN: 231 mg/dL (ref 106–279)
ALT: 91 U/L — AB (ref 9–46)
APOLIPOPROTEIN A1: 170 mg/dL (ref 94–176)
BILIRUBIN: 0.4 mg/dL (ref 0.2–1.2)
Fibrosis Score: 0.46
GGT: 246 U/L — AB (ref 3–70)
Haptoglobin: 146 mg/dL (ref 43–212)
NECROINFLAMMAT ACT SCORE: 0.59
Reference ID: 1559024

## 2015-08-30 LAB — HCV RNA, QUANT REAL-TIME PCR W/REFLEX
HCV RNA, PCR, QN (Log): 6.64 LogIU/mL — ABNORMAL HIGH
HCV RNA, PCR, QN: 4380000 IU/mL — ABNORMAL HIGH

## 2015-08-30 LAB — HCV RNA,LIPA RFLX NS5A DRUG RESIST

## 2015-08-31 ENCOUNTER — Ambulatory Visit (INDEPENDENT_AMBULATORY_CARE_PROVIDER_SITE_OTHER): Payer: Commercial Managed Care - HMO | Admitting: Internal Medicine

## 2015-08-31 ENCOUNTER — Encounter: Payer: Self-pay | Admitting: Internal Medicine

## 2015-08-31 VITALS — BP 135/87 | HR 76 | Temp 97.9°F | Ht 66.0 in | Wt 187.0 lb

## 2015-08-31 DIAGNOSIS — B182 Chronic viral hepatitis C: Secondary | ICD-10-CM | POA: Diagnosis not present

## 2015-08-31 DIAGNOSIS — K7460 Unspecified cirrhosis of liver: Secondary | ICD-10-CM | POA: Diagnosis not present

## 2015-08-31 MED ORDER — LEDIPASVIR-SOFOSBUVIR 90-400 MG PO TABS: 1.0000 | ORAL_TABLET | Freq: Every day | ORAL | Status: DC

## 2015-08-31 NOTE — Patient Instructions (Signed)
Date 08/31/2015  Dear [name], As discussed in the [facility], your hepatitis C therapy will include the following medications:          Harvoni 90mg /400mg  tablet:           Take 1 tablet by mouth once daily   Please note that ALL MEDICATIONS WILL START ON THE SAME DATE for a total of 12 weeks. ---------------------------------------------------------------- Your HCV Treatment Start Date: TBA   Your HCV genotype:  1a    Liver Fibrosis: cirrhosis   ---------------------------------------------------------------- YOUR PHARMACY CONTACT:   [facility] Phone: [phone] Hours: Monday to Friday 7:30 am to 6:00 pm   Please always contact your pharmacy at least 3-4 business days before you run out of medications to ensure your next month's medication is ready or 1 week prior to running out if you receive it by mail.  Remember, each prescription is for 28 days. ---------------------------------------------------------------- GENERAL NOTES REGARDING YOUR HEPATITIS C MEDICATION:  SOFOSBUVIR/LEDIPASVIR (HARVONI): - Harvoni tablet is taken daily with OR without food. - The tablets are orange. - The tablets should be stored at room temperature.  - Acid reducing agents such as H2 blockers (ie. Pepcid (famotidine), Zantac (ranitidine), Tagamet (cimetidine), Axid (nizatidine) and proton pump inhibitors (ie. Prilosec (omeprazole), Protonix (pantoprazole), Nexium (esomeprazole), or Aciphex (rabeprazole)) can decrease effectiveness of Harvoni. Do not take until you have discussed with a health care provider.    -Antacids that contain magnesium and/or aluminum hydroxide (ie. Milk of Magensia, Rolaids, Gaviscon, Maalox, Mylanta, an dArthritis Pain Formula)can reduce absorption of Harvoni, so take them at least 4 hours before or after Harvoni.  -Calcium carbonate (calcium supplements or antacids such as Tums, Caltrate,  Os-Cal)needs to be taken at least 4 hours hours before or after Harvoni.  -St. John's wort or any products that contain St. John's wort like some herbal supplements  Please inform the office prior to starting any of these medications.  - The common side effects associated with Harvoni include:      1. Fatigue      2. Headache      3. Nausea      4. Diarrhea      5. Insomnia  Please note that this only lists the most common side effects and is NOT a comprehensive list of the potential side effects of these medications. For more information, please review the drug information sheets that come with your medication package from the pharmacy.  ---------------------------------------------------------------- GENERAL HELPFUL HINTS ON HCV THERAPY: 1. Stay well-hydrated. 2. Notify the [facility] of any changes in your other over-the-counter/herbal or prescription medications. 3. If you miss a dose of your medication, take the missed dose as soon as you remember. Return to your regular time/dose schedule the next day.  4.  Do not stop taking your medications without first talking with your healthcare provider. 5.  You may take Tylenol (acetaminophen), as long as the dose is less than 2000 mg (OR no more than 4 tablets of the Tylenol Extra Strengths 500mg  tablet) in 24 hours. 6.  You will see our pharmacist-specialist within the first 2 weeks of starting your medication. 7.  You will need to obtain routine labs around week 4 and12 weeks after starting and then 3 to 6 months after finishing Harvoni.    [name], [place] for [place], [place]  [id number] [phone] 

## 2015-08-31 NOTE — Progress Notes (Signed)
[place] for Infectious Disease   CC: consideration for treatment for chronic hepatitis C  HPI:  +[name] is a 65 y.o. male who presents for initial evaluation and management of chronic hepatitis C.  Patient tested positive about 20 years ago. Hepatitis C-associated risk factors present are: IV drug abuse (details: over 20 years ago). Patient denies history of blood transfusion, renal dialysis, sexual contact with person with liver disease, tattoos. Patient has had other studies performed. Results: hepatitis C RNA by PCR, result: positive. Patient has not had prior treatment for Hepatitis C. Patient does not have a past history of liver disease. Patient does not have a family history of liver disease. Patient does not  have associated signs or symptoms related to liver disease.  Labs reviewed and confirm chronic hepatitis C with a positive viral load.   Records reviewed from PCP and radiology.  He recently had a CTA scan of abd/pelvis and liver did note an abnormal appearance, nodular contour and some possible hyoattenuating areas.  Also his AFP is elevated.      Patient does have documented immunity to Hepatitis A. Patient does not have documented immunity to Hepatitis B.    Review of Systems:   Constitutional: negative for fatigue and malaise Musculoskeletal: negative for myalgias and arthralgias All other systems reviewed and are negative      Past Medical History  Diagnosis Date  . Hypertension   . Coronary artery disease   . High cholesterol   . COPD (chronic obstructive pulmonary disease) ([place])   . Shortness of breath dyspnea   . Asthma   . Pneumonia 03/2015  . Type II diabetes mellitus ([place])   . Hepatitis C   . Arthritis     "knees, hands" (03/08/2015)  . Chronic diastolic CHF (congestive heart failure) ([place])     notes 03/08/2015  . Chronic bronchitis ([place])     Prior to Admission medications   Medication Sig Start Date End Date Taking? Authorizing Provider    benzonatate (TESSALON) 200 MG capsule Take 1 capsule (200 mg total) by mouth 3 (three) times daily. For cough 06/02/15  Yes Ripudeep K Rai, MD  cholecalciferol (VITAMIN D) 1000 UNITS tablet Take 1 tablet (1,000 Units total) by mouth daily. Patient taking differently: 2,000 Units daily.  08/20/12  Yes Robbie Lis, MD  Cinnamon 500 MG capsule Take 500 mg by mouth 2 (two) times daily.   Yes Historical Provider, MD  glucose blood test strip Use as instructed to check blood sugar four times a day. DXE11.09 12/15/14  Yes Hoyt Koch, MD  guaiFENesin-codeine 100-10 MG/5ML syrup Take 5 mLs by mouth every 6 (six) hours as needed for cough. 03/10/15  Yes Ripudeep K Rai, MD  insulin NPH Human (NOVOLIN N) 100 UNIT/ML injection Inject 0.4 mLs (40 Units total) into the skin every morning. And syringes 1/day 07/13/15  Yes Renato Shin, MD  Insulin Pen Needle (NOVOFINE) 32G X 6 MM MISC Use to inject insulin 1 time per day. 07/11/15  Yes Renato Shin, MD  Insulin Syringe-Needle U-100 (INSULIN SYRINGE 1CC/31GX5/16") 31G X 5/16" 1 ML MISC Use to inject insulin 1 time per day. 07/13/15  Yes Renato Shin, MD  Lancets MISC Use to test sugar four times daily. DX E11.09 12/15/14  Yes Hoyt Koch, MD  losartan-hydrochlorothiazide (HYZAAR) 100-12.5 MG tablet Take 1 tablet by mouth daily. 06/24/15  Yes Hoyt Koch, MD  Multiple Vitamins-Minerals (MULTIVITAMIN WITH MINERALS) tablet Take 1 tablet  by mouth daily.   Yes Historical Provider, MD  Omega-3 Fatty Acids (FISH OIL) 1000 MG CAPS Take 2 capsules by mouth daily.   Yes Historical Provider, MD  omeprazole (PRILOSEC) 40 MG capsule TAKE ONE CAPSULE BY MOUTH DAILY 05/23/15  Yes Hoyt Koch, MD  traZODone (DESYREL) 50 MG tablet TAKE ONE TABLET BY MOUTH AT BEDTIME AS NEEDED FOR SLEEP 05/23/15  Yes Hoyt Koch, MD  Umeclidinium-Vilanterol 62.5-25 MCG/INH AEPB Inhale 1 puff into the lungs daily. 12/15/14  Yes Hoyt Koch, MD  VENTOLIN  HFA 108 [phone] Base) MCG/ACT inhaler INHALE TWO PUFFS BY MOUTH EVERY 6 HOURS AS NEEDED FOR WHEEZING FOR SHORTNESS OF BREATH 05/04/15  Yes Hoyt Koch, MD  aspirin EC 81 MG tablet Take 1 tablet (81 mg total) by mouth daily. 08/31/13   Lorayne Marek, MD  Ledipasvir-Sofosbuvir (HARVONI) 90-400 MG TABS Take 1 tablet by mouth daily. 08/31/15   Thayer Headings, MD    Allergies  Allergen Reactions  . Crestor [Rosuvastatin] Other (See Comments)    INTOLERANCE   . Mobic [Meloxicam] Rash    Social History  Substance Use Topics  . Smoking status: Former Smoker -- 1.50 packs/day for 46 years    Types: Cigarettes    Quit date: 03/19/2013  . Smokeless tobacco: Never Used  . Alcohol Use: 0.0 oz/week    0 Standard drinks or equivalent per week     Comment: "recovering alcoholic; 123XX123"    Family History  Problem Relation Age of Onset  . Stroke Mother   . Diabetes Mother   . Hypertension Mother   . Hyperlipidemia Mother   . Heart disease Father   . Hypertension Father   . Heart attack Father      Objective:  Constitutional: in no apparent distress and alert,  Filed Vitals:   08/31/15 1032  BP: 135/87  Pulse: 76  Temp: 97.9 F (36.6 C)   Eyes: anicteric Cardiovascular: Cor RRR and No murmurs Respiratory: CTA B; normal respiratory effort Gastrointestinal: Bowel sounds are normal, liver is not enlarged, spleen is not enlarged Musculoskeletal: no pedal edema noted Skin: negatives: no rash; no porphyria cutanea tarda Lymphatic: no cervical lymphadenopathy   Laboratory Genotype: No results found for: HCVGENOTYPE HCV viral load: No results found for: HCVQUANT Lab Results  Component Value Date   WBC 5.6 06/01/2015   HGB 12.7* 06/01/2015   HCT 39.1 06/01/2015   MCV 84.1 06/01/2015   PLT 193 06/01/2015    Lab Results  Component Value Date   CREATININE 0.91 06/01/2015   BUN 18 06/01/2015   NA 133* 06/01/2015   K 3.8 06/01/2015   CL 99* 06/01/2015   CO2 25 06/01/2015     Lab Results  Component Value Date   ALT 91* 08/24/2015   AST 49* 04/26/2015   ALKPHOS 45 04/26/2015     Labs and history reviewed and show CHILD-PUGH A  5-6 points: Child class A 7-9 points: Child class B 10-15 points: Child class C  Lab Results  Component Value Date   INR 1.08 05/27/2015   BILITOT 0.5 04/26/2015   ALBUMIN 3.6 04/26/2015     Assessment: New Patient with Chronic Hepatitis C genotype 1a, untreated.  I discussed with the patient the lab findings that confirm chronic hepatitis C as well as the natural history and progression of disease including about 30% of people who develop cirrhosis of the liver if left untreated and once cirrhosis is established there is a 2-7% risk  per year of liver cancer and liver failure.  I discussed the importance of treatment and benefits in reducing the risk, even if significant liver fibrosis exists.   Plan: 1) Patient counseled extensively on limiting acetaminophen to no more than 2 grams daily, avoidance of alcohol. 2) Transmission discussed with patient including sexual transmission, sharing razors and toothbrush.   3) Will need referral to gastroenterology if concern for cirrhosis 4) Will need referral for substance abuse counseling: No.; Further work up to include urine drug screen  No. 5) Will prescribe Harvoni for 12 weeks 6) Hepatitis A vaccine No. 7) Hepatitis B vaccine Yes.   8) Pneumovax vaccine if concern for cirrhosis; fibrosure F1-2 which is consistent with his previous biopsy results which were 2-3.  9) Further work up to include liver staging with elastography 10) His CT findings are equivocal but with his elevated AFP, I am going to check a liver MRI to be sure no mass developing and better determine if he has cirrhosis.

## 2015-09-01 LAB — HCV RNA NS5A DRUG RESISTANCE

## 2015-09-05 ENCOUNTER — Telehealth: Payer: Self-pay

## 2015-09-05 NOTE — Telephone Encounter (Signed)
[facility] prior authorization for MRI faxed. Rodman Key, LPN

## 2015-09-09 DIAGNOSIS — H25811 Combined forms of age-related cataract, right eye: Secondary | ICD-10-CM | POA: Diagnosis not present

## 2015-09-09 DIAGNOSIS — Z961 Presence of intraocular lens: Secondary | ICD-10-CM | POA: Diagnosis not present

## 2015-09-13 DIAGNOSIS — H2511 Age-related nuclear cataract, right eye: Secondary | ICD-10-CM | POA: Diagnosis not present

## 2015-09-13 NOTE — Addendum Note (Signed)
Addended by: Rodman Key A on: 09/13/2015 11:26 AM   Modules accepted: Orders

## 2015-09-19 DIAGNOSIS — H268 Other specified cataract: Secondary | ICD-10-CM | POA: Diagnosis not present

## 2015-09-19 DIAGNOSIS — H2511 Age-related nuclear cataract, right eye: Secondary | ICD-10-CM | POA: Diagnosis not present

## 2015-09-22 ENCOUNTER — Ambulatory Visit
Admission: RE | Admit: 2015-09-22 | Discharge: 2015-09-22 | Disposition: A | Discharge status: Home / Self Care | Payer: Commercial Managed Care - HMO | Source: Ambulatory Visit | Attending: Internal Medicine | Admitting: Internal Medicine

## 2015-09-22 DIAGNOSIS — K7460 Unspecified cirrhosis of liver: Secondary | ICD-10-CM

## 2015-09-22 MED ORDER — GADOXETATE DISODIUM 0.25 MMOL/ML IV SOLN
8.0000 mL | Freq: Once | INTRAVENOUS | Status: AC | PRN
  Administered 2015-09-22: 8 mL via INTRAVENOUS

## 2015-09-23 ENCOUNTER — Ambulatory Visit: Payer: Commercial Managed Care - HMO | Admitting: Pulmonary Disease

## 2015-09-26 MED FILL — *HARVONI 90-400 MG TABLET: 90-400 | 28 days supply | Qty: 28 | Fill #0

## 2015-09-27 ENCOUNTER — Encounter: Payer: Self-pay | Admitting: Pharmacy Technician

## 2015-10-17 ENCOUNTER — Ambulatory Visit: Payer: Commercial Managed Care - HMO

## 2015-10-18 ENCOUNTER — Other Ambulatory Visit: Payer: Commercial Managed Care - HMO

## 2015-10-18 ENCOUNTER — Ambulatory Visit: Payer: Commercial Managed Care - HMO

## 2015-10-18 ENCOUNTER — Ambulatory Visit: Payer: Commercial Managed Care - HMO | Admitting: Pharmacist

## 2015-10-18 DIAGNOSIS — B182 Chronic viral hepatitis C: Secondary | ICD-10-CM | POA: Diagnosis not present

## 2015-10-18 NOTE — Progress Notes (Signed)
HPI: [name] is a 65 y.o. male who presents to the [facility] for f/u of his Hep C. He has genotype 1a and started Harvoni around 7/25.   No results found for: HCVGENOTYPE, HEPCGENOTYPE  Allergies: Allergies  Allergen Reactions  . Crestor [Rosuvastatin] Other (See Comments)    INTOLERANCE   . Mobic [Meloxicam] Rash    Past Medical History: Past Medical History:  Diagnosis Date  . Arthritis    "knees, hands" (03/08/2015)  . Asthma   . Chronic bronchitis ([place])   . Chronic diastolic CHF (congestive heart failure) ([place])    notes 03/08/2015  . COPD (chronic obstructive pulmonary disease) ([place])   . Coronary artery disease   . Hepatitis C   . High cholesterol   . Hypertension   . Pneumonia 03/2015  . Shortness of breath dyspnea   . Type II diabetes mellitus ([place])     Social History: Social History   Social History  . Marital status: Divorced    Spouse name: N/A  . Number of children: N/A  . Years of education: N/A   Social History Main Topics  . Smoking status: Former Smoker    Packs/day: 1.50    Years: 46.00    Types: Cigarettes    Quit date: 03/19/2013  . Smokeless tobacco: Never Used  . Alcohol use 0.0 oz/week     Comment: "recovering alcoholic; 123XX123"  . Drug use: No  . Sexual activity: Not Currently   Other Topics Concern  . Not on file   Social History Narrative  . No narrative on file    Labs: Hep B S Ab (no units)  Date Value  08/24/2015 NEG   Hepatitis B Surface Ag (no units)  Date Value  08/24/2015 NEGATIVE   HCV Ab (no units)  Date Value  02/09/2011 Reactive (A)    No results found for: HCVGENOTYPE, HEPCGENOTYPE  No flowsheet data found.  AST (U/L)  Date Value  04/26/2015 49 (H)  02/09/2015 87 (H)  12/15/2014 120 (H)   ALT (U/L)  Date Value  08/24/2015 91 (H)  04/26/2015 80 (H)  02/09/2015 135 (H)  12/15/2014 141 (H)   INR (no units)  Date Value  05/27/2015 1.08  02/09/2011 1.19  10/26/2010 1.55 (H)     CrCl: CrCl cannot be calculated (Unknown ideal weight.).  Fibrosis Score: F4 as assessed by MRI   Child-Pugh Score: A  Previous Treatment Regimen: None  Assessment: Cleave is here for f/u of his Hep C. He tells me he has not missed any doses or having any side effects.  He knows to call the pharmacy for a refill when he has 3-4 pills left.  I verified that he was not taking Prilosec - he tells me he stopped that before he started taking the Harvoni and tells me he doesn't think he needs it anymore, so I took it off his profile. He seems very motivated about his health and commented on how he has waited ~20 years to treat his Hep C. I explained his MRI results to him and he understood.  He has no further questions for me. We will get a Hep C VL today and make an appointment with Dr. Linus Salmons around the end of treatment.   Plans: - Continue Harvoni 90-400 mg PO daily x 3 months - Hep C VL today - F/u with Dr.Comer 11/28 at 1:45pm  Cassie Riley Nearing, PharmD Infectious [place] for Infectious Disease 10/18/2015,  3:29 PM

## 2015-10-19 ENCOUNTER — Ambulatory Visit: Payer: Commercial Managed Care - HMO

## 2015-10-20 ENCOUNTER — Other Ambulatory Visit: Payer: Self-pay | Admitting: Internal Medicine

## 2015-10-21 LAB — HEPATITIS C RNA QUANTITATIVE
HCV QUANT LOG: 1.38 {Log} — AB (ref <1.18)
HCV QUANT: 24 [IU]/mL — AB (ref <15)

## 2015-10-21 MED FILL — *HARVONI 90-400 MG TABLET: 90-400 | 28 days supply | Qty: 28 | Fill #1

## 2015-11-02 ENCOUNTER — Ambulatory Visit: Payer: Commercial Managed Care - HMO | Admitting: Pulmonary Disease

## 2015-11-09 ENCOUNTER — Ambulatory Visit (INDEPENDENT_AMBULATORY_CARE_PROVIDER_SITE_OTHER): Payer: Commercial Managed Care - HMO | Admitting: Endocrinology

## 2015-11-09 ENCOUNTER — Encounter: Payer: Self-pay | Admitting: Endocrinology

## 2015-11-09 VITALS — BP 120/82 | HR 90 | Wt 190.0 lb

## 2015-11-09 DIAGNOSIS — E119 Type 2 diabetes mellitus without complications: Secondary | ICD-10-CM

## 2015-11-09 DIAGNOSIS — Z794 Long term (current) use of insulin: Secondary | ICD-10-CM | POA: Diagnosis not present

## 2015-11-09 LAB — MICROALBUMIN / CREATININE URINE RATIO
CREATININE, U: 59.8 mg/dL
MICROALB/CREAT RATIO: 1.2 mg/g (ref 0.0–30.0)
Microalb, Ur: <0.7 mg/dL (ref 0.0–1.9)

## 2015-11-09 LAB — POCT GLYCOSYLATED HEMOGLOBIN (HGB A1C): HEMOGLOBIN A1C: 8.2

## 2015-11-09 MED ORDER — INSULIN NPH (HUMAN) (ISOPHANE) 100 UNIT/ML ~~LOC~~ SUSP: 45.0000 [IU] | SUBCUTANEOUS | 11 refills | Status: DC

## 2015-11-09 NOTE — Progress Notes (Signed)
Subjective:    Patient ID: [name], male    DOB: Mar 16, 1950, 65 y.o.   MRN: [id number]  HPI Pt returns for f/u of diabetes mellitus: DM type: Insulin-requiring type 2 Dx'ed: [id number] Complications: PAD Therapy: insulin since 2016.   DKA: never Severe hypoglycemia: never Pancreatitis: never Other: he chooses qd insulin; he is chronically off and on prednisone, for asthma; he changed lantus to NPH, due to pattern of cbg's; ins declines levemir.    Interval history: no recent steroids.  no cbg record, but states cbg's vary from 97-100's.  It is in general higher as the day goes on.   Past Medical History:  Diagnosis Date  . Arthritis    "knees, hands" (03/08/2015)  . Asthma   . Chronic bronchitis ([place])   . Chronic diastolic CHF (congestive heart failure) ([place])    notes 03/08/2015  . COPD (chronic obstructive pulmonary disease) ([place])   . Coronary artery disease   . Hepatitis C   . High cholesterol   . Hypertension   . Pneumonia 03/2015  . Shortness of breath dyspnea   . Type II diabetes mellitus ([place])     Past Surgical History:  Procedure Laterality Date  . CATARACT EXTRACTION Left 02/2014  . ESOPHAGOGASTRODUODENOSCOPY  02/09/2011   Procedure: ESOPHAGOGASTRODUODENOSCOPY (EGD);  Surgeon: Missy Sabins, MD;  Location: [facility] ENDOSCOPY;  Service: Endoscopy;  Laterality: N/A;  . JOINT REPLACEMENT    . 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 Marital status: Divorced    Spouse name: N/A  . Number of children: N/A  . Years of education: N/A   Occupational History  . Not on file.   Social History Main Topics  . Smoking status: Former Smoker    Packs/day: 1.50    Years: 46.00    Types: Cigarettes    Quit date: 03/19/2013  . Smokeless tobacco: Never Used  . Alcohol use 0.0 oz/week     Comment: "recovering alcoholic; 123XX123"  . Drug use: No  . Sexual activity: Not Currently   Other Topics Concern  . Not on file   Social History  Narrative  . No narrative on file    Current Outpatient Prescriptions on File Prior to Visit  Medication Sig Dispense Refill  . aspirin EC 81 MG tablet Take 1 tablet (81 mg total) by mouth daily. 30 tablet 3  . benzonatate (TESSALON) 200 MG capsule Take 1 capsule (200 mg total) by mouth 3 (three) times daily. For cough 30 capsule 0  . cholecalciferol (VITAMIN D) 1000 UNITS tablet Take 1 tablet (1,000 Units total) by mouth daily. (Patient taking differently: 2,000 Units daily. ) 30 tablet 0  . Cinnamon 500 MG capsule Take 500 mg by mouth 2 (two) times daily.    [name] glucose blood test strip Use as instructed to check blood sugar four times a day. DXE11.09 400 each 1  . guaiFENesin-codeine 100-10 MG/5ML syrup Take 5 mLs by mouth every 6 (six) hours as needed for cough. 120 mL 0  . Insulin Pen Needle (NOVOFINE) 32G X 6 MM MISC Use to inject insulin 1 time per day. 90 each 2  . Insulin Syringe-Needle U-100 (INSULIN SYRINGE 1CC/31GX5/16") 31G X 5/16" 1 ML MISC Use to inject insulin 1 time per day. 90 each 2  . Lancets MISC Use to test sugar four times daily. DX E11.09 400 each 1  . Ledipasvir-Sofosbuvir (HARVONI) 90-400 MG TABS Take 1 tablet by  mouth daily. 28 tablet 2  . losartan-hydrochlorothiazide (HYZAAR) 100-12.5 MG tablet Take 1 tablet by mouth daily. 90 tablet 3  . metFORMIN (GLUCOPHAGE) 1000 MG tablet Take 1 tablet (1,000 mg total) by mouth 2 (two) times daily with a meal. 180 tablet 1  . Multiple Vitamins-Minerals (MULTIVITAMIN WITH MINERALS) tablet Take 1 tablet by mouth daily.    . Omega-3 Fatty Acids (FISH OIL) 1000 MG CAPS Take 2 capsules by mouth daily.    [name] Umeclidinium-Vilanterol 62.5-25 MCG/INH AEPB Inhale 1 puff into the lungs daily. 60 each 6  . VENTOLIN HFA 108 (90 Base) MCG/ACT inhaler INHALE TWO PUFFS BY MOUTH EVERY 6 HOURS AS NEEDED FOR WHEEZING FOR SHORTNESS OF BREATH 54 each 0  . traZODone (DESYREL) 50 MG tablet TAKE ONE TABLET BY MOUTH AT BEDTIME AS NEEDED FOR SLEEP (Patient  not taking: Reported on 11/09/2015) 90 tablet 1   No current facility-administered medications on file prior to visit.     Allergies  Allergen Reactions  . Crestor [Rosuvastatin] Other (See Comments)    INTOLERANCE   . Mobic [Meloxicam] Rash    Family History  Problem Relation Age of Onset  . Stroke Mother   . Diabetes Mother   . Hypertension Mother   . Hyperlipidemia Mother   . Heart disease Father   . Hypertension Father   . Heart attack Father     BP 120/82 (BP Location: Left Arm, Patient Position: Sitting)   Pulse 90   Wt 190 lb (86.2 kg)   SpO2 97%   BMI 30.67 kg/m   Review of Systems He denies hypoglycemia    Objective:   Physical Exam VITAL SIGNS:  See vs page GENERAL: no distress Pulses: dorsalis pedis intact bilat.   MSK: no deformity of the feet CV: no leg edema Skin:  no ulcer on the feet.  normal color and temp on the feet. Neuro: sensation is intact to touch on the feet.   A1c=8.2%    Assessment & Plan:  Insulin-requiring type 2 DM: he needs increased rx. This patient would benefit for diabetic shoes and/or inserts.

## 2015-11-09 NOTE — Patient Instructions (Addendum)
check your blood sugar twice a day.  vary the time of day when you check, between before the 3 meals, and at bedtime.  also check if you have symptoms of your blood sugar being too high or too low.  please keep a record of the readings and bring it to your next appointment here (or you can bring the meter itself).  You can write it on any piece of paper.  please call us sooner if your blood sugar goes below 70, or if you have a lot of readings over 200. Please increase NPH insulin to 45 units each morning On this type of insulin schedule, you should eat meals on a regular schedule.  If a meal is missed or significantly delayed, your blood sugar could go low.  Also, call us whenever you go on the steroids, so we can adjust the insulin.   Please come back for a follow-up appointment 3 months.

## 2015-11-17 MED FILL — *HARVONI 90-400 MG TABLET: 90-400 | 28 days supply | Qty: 28 | Fill #2

## 2015-12-08 ENCOUNTER — Ambulatory Visit: Payer: Commercial Managed Care - HMO | Admitting: Internal Medicine

## 2015-12-23 ENCOUNTER — Encounter: Payer: Self-pay | Admitting: Internal Medicine

## 2015-12-26 ENCOUNTER — Other Ambulatory Visit: Payer: Self-pay | Admitting: Internal Medicine

## 2016-01-09 ENCOUNTER — Encounter: Payer: Self-pay | Admitting: Internal Medicine

## 2016-01-09 ENCOUNTER — Ambulatory Visit (INDEPENDENT_AMBULATORY_CARE_PROVIDER_SITE_OTHER): Payer: Commercial Managed Care - HMO | Admitting: Internal Medicine

## 2016-01-09 VITALS — BP 122/64 | HR 94 | Temp 98.9°F | Resp 14 | Ht 66.0 in | Wt 190.6 lb

## 2016-01-09 DIAGNOSIS — E785 Hyperlipidemia, unspecified: Secondary | ICD-10-CM | POA: Diagnosis not present

## 2016-01-09 DIAGNOSIS — Z23 Encounter for immunization: Secondary | ICD-10-CM

## 2016-01-09 DIAGNOSIS — N529 Male erectile dysfunction, unspecified: Secondary | ICD-10-CM

## 2016-01-09 MED ORDER — ALBUTEROL SULFATE HFA 108 (90 BASE) MCG/ACT IN AERS: 1.0000 | INHALATION_SPRAY | RESPIRATORY_TRACT | 3 refills | Status: DC | PRN

## 2016-01-09 MED ORDER — LOSARTAN POTASSIUM-HCTZ 100-12.5 MG PO TABS
1.0000 | ORAL_TABLET | Freq: Every day | ORAL | 3 refills | Status: DC
Start: 2016-01-09 — End: 2016-10-24

## 2016-01-09 MED ORDER — SILDENAFIL CITRATE 100 MG PO TABS: 50.0000 mg | ORAL_TABLET | Freq: Every day | ORAL | 11 refills | Status: DC | PRN

## 2016-01-09 MED ORDER — UMECLIDINIUM-VILANTEROL 62.5-25 MCG/INH IN AEPB: INHALATION_SPRAY | RESPIRATORY_TRACT | 3 refills | Status: DC

## 2016-01-09 NOTE — Patient Instructions (Addendum)
We have sent in the refills of the breathing medicines.   Start taking the anoro everyday and the albuterol as needed.   If the breathing gets worse call us and we may have to do some steroids.  We have sent in the viagra and are checking the labs today.

## 2016-01-09 NOTE — Assessment & Plan Note (Signed)
Checking lipid panel and likely needs statin. Will do pravastatin since side effects with crestor previously. Concurrent diabetes and PVD that both indicate need for statin. He is willing to retry.

## 2016-01-09 NOTE — Assessment & Plan Note (Signed)
Rx for viagra to use. He is able to walk a flight of stairs without anginal pains. Does not have or take nitro and reminded him about this interaction as well as indication to seek care if erection lasting longer than 4 hours.

## 2016-01-09 NOTE — Progress Notes (Signed)
   Subjective:    Patient ID: [name], male    DOB: 03/17/1950, 65 y.o.   MRN: [id number]  HPI The patient is a 65 YO man coming in for ED. He has seen urology in the past and they gave him a pump to use but this is painful. He does not like using it. No sex in several years as a result. He previously has used viagra with good success and is willing to pay for it.  He is also concerned about his cholesterol. He has not had it rechecked since he stopped taking his crestor and with his diabetes and heart disease he is worried about something happening.   Review of Systems  Constitutional: Negative for activity change, appetite change, fatigue, fever and unexpected weight change.  Respiratory: Negative.   Cardiovascular: Negative.   Gastrointestinal: Negative.   Musculoskeletal: Positive for arthralgias. Negative for back pain, gait problem and myalgias.  Skin: Negative.   Neurological: Negative.       Objective:   Physical Exam  Constitutional: He is oriented to person, place, and time. He appears well-developed and well-nourished.  HENT:  Head: Normocephalic and atraumatic.  Eyes: EOM are normal.  Cardiovascular: Normal rate and regular rhythm.   Pulmonary/Chest: Effort normal and breath sounds normal.  Abdominal: Soft. He exhibits no distension. There is no tenderness. There is no rebound.  Neurological: He is alert and oriented to person, place, and time.  Skin: Skin is warm and dry.   Vitals:   01/09/16 1304  BP: 122/64  Pulse: 94  Resp: 14  Temp: 98.9 F (37.2 C)  TempSrc: Oral  SpO2: 98%  Weight: 190 lb 9.6 oz (86.5 kg)  Height: 5\' 6"  (1.676 m)      Assessment & Plan:  Flu shot given at visit.

## 2016-01-09 NOTE — Progress Notes (Signed)
Pre visit review using our clinic review tool, if applicable. No additional management support is needed unless otherwise documented below in the visit note. 

## 2016-01-17 ENCOUNTER — Ambulatory Visit: Payer: Commercial Managed Care - HMO | Admitting: Internal Medicine

## 2016-01-18 ENCOUNTER — Telehealth: Payer: Self-pay | Admitting: Internal Medicine

## 2016-01-18 NOTE — Telephone Encounter (Signed)
Patient called in to advise that he cannot afford the cost of the viagra at any of the pharmacies he has checked. He is asking if there is a discount card or sample we can give him

## 2016-01-19 NOTE — Telephone Encounter (Signed)
Left message informing patient I have placed a discount card up front for him to pick up.

## 2016-01-23 ENCOUNTER — Other Ambulatory Visit: Payer: Self-pay | Admitting: Internal Medicine

## 2016-01-31 ENCOUNTER — Encounter: Payer: Self-pay | Admitting: Internal Medicine

## 2016-01-31 ENCOUNTER — Ambulatory Visit (INDEPENDENT_AMBULATORY_CARE_PROVIDER_SITE_OTHER): Payer: Commercial Managed Care - HMO | Admitting: Internal Medicine

## 2016-01-31 VITALS — BP 105/81 | HR 103 | Temp 98.6°F | Wt 190.0 lb

## 2016-01-31 DIAGNOSIS — K7460 Unspecified cirrhosis of liver: Secondary | ICD-10-CM

## 2016-01-31 DIAGNOSIS — K219 Gastro-esophageal reflux disease without esophagitis: Secondary | ICD-10-CM | POA: Diagnosis not present

## 2016-01-31 DIAGNOSIS — B182 Chronic viral hepatitis C: Secondary | ICD-10-CM

## 2016-02-01 ENCOUNTER — Encounter: Payer: Self-pay | Admitting: Internal Medicine

## 2016-02-01 NOTE — Assessment & Plan Note (Addendum)
Referred to GI for ? EGD.   Will do [place] screening with ultrasound now and in 6 months. He can then continue with this with his PCP Had pneumovax

## 2016-02-01 NOTE — Assessment & Plan Note (Signed)
He has done well without his Nexium so will continue without it.

## 2016-02-01 NOTE — Progress Notes (Signed)
   Subjective:    Patient ID: [name], male    DOB: Aug 13, 1950, 65 y.o.   MRN: [id number]  HPI Here for follow up of HCV. Has genotype 1a, viral load 4.3 million, MRI with cirrhosis.  Started and now completed Har[place].  Completed last month.  No new issues. No alcohol.  Pleased with treatment and no side effects.  Early viral load just 24 copies. No reflux.    Review of Systems  Constitutional: Negative for fatigue.  Gastrointestinal: Negative for diarrhea.  Skin: Negative for rash.  Neurological: Negative for dizziness and headaches.       Objective:   Physical Exam  Constitutional: He appears well-developed and well-nourished. No distress.  Eyes: No scleral icterus.  Cardiovascular: Normal rate, regular rhythm and normal heart sounds.   Pulmonary/Chest: Effort normal and breath sounds normal.  Abdominal: Soft. Bowel sounds are normal. He exhibits no distension.  Musculoskeletal: He exhibits no edema.  Skin: No rash noted.    Social History   Social History  . Marital status: Divorced    Spouse name: N/A  . Number of children: N/A  . Years of education: N/A   Occupational History  . Not on file.   Social History Main Topics  . Smoking status: Former Smoker    Packs/day: 1.50    Years: 46.00    Types: Cigarettes    Quit date: 03/19/2013  . Smokeless tobacco: Never Used  . Alcohol use 0.0 oz/week     Comment: "recovering alcoholic; [id number]"  . Drug use: No  . Sexual activity: Not Currently   Other Topics Concern  . Not on file   Social History Narrative  . No narrative on file        Assessment & Plan:

## 2016-02-01 NOTE — Assessment & Plan Note (Signed)
EOT lab today and rtc 6 months for SVR 24

## 2016-02-02 LAB — HEPATITIS C RNA QUANTITATIVE: HCV Quantitative: NOT DETECTED IU/mL (ref <15)

## 2016-02-08 ENCOUNTER — Ambulatory Visit: Payer: Commercial Managed Care - HMO | Admitting: Endocrinology

## 2016-02-24 ENCOUNTER — Other Ambulatory Visit: Payer: Self-pay | Admitting: Internal Medicine

## 2016-03-09 ENCOUNTER — Other Ambulatory Visit: Payer: Self-pay | Admitting: *Deleted

## 2016-03-09 MED ORDER — GLUCOSE BLOOD VI STRP: ORAL_STRIP | 3 refills | Status: DC

## 2016-03-09 NOTE — Telephone Encounter (Signed)
Rec'd fax stating script that was sent on 02/24/16 for accu-chek testing strips. Can not process without diagnoses code. Pls resend. Updated script resent to [place]...[name]

## 2016-03-16 ENCOUNTER — Ambulatory Visit: Payer: Commercial Managed Care - HMO | Admitting: Internal Medicine

## 2016-03-29 ENCOUNTER — Other Ambulatory Visit: Payer: Self-pay | Admitting: General Practice

## 2016-03-29 MED ORDER — GLUCOSE BLOOD VI STRP: ORAL_STRIP | 0 refills | Status: DC

## 2016-03-29 MED ORDER — TRUEPLUS LANCETS 28G MISC: 0 refills | Status: DC

## 2016-03-29 MED ORDER — BD SWAB SINGLE USE REGULAR PADS: MEDICATED_PAD | 0 refills | Status: DC

## 2016-03-29 MED ORDER — TRUE METRIX AIR GLUCOSE METER W/DEVICE KIT: 1.0000 | PACK | Freq: Two times a day (BID) | 0 refills | Status: DC

## 2016-03-29 MED ORDER — TRAZODONE HCL 50 MG PO TABS: 50.0000 mg | ORAL_TABLET | Freq: Every evening | ORAL | 0 refills | Status: DC | PRN

## 2016-03-29 MED ORDER — METFORMIN HCL 1000 MG PO TABS: 1000.0000 mg | ORAL_TABLET | Freq: Two times a day (BID) | ORAL | 0 refills | Status: DC

## 2016-04-09 ENCOUNTER — Ambulatory Visit: Payer: Commercial Managed Care - HMO | Admitting: Internal Medicine

## 2016-04-10 ENCOUNTER — Ambulatory Visit: Payer: Commercial Managed Care - HMO | Admitting: Endocrinology

## 2016-04-13 ENCOUNTER — Ambulatory Visit: Payer: Commercial Managed Care - HMO | Admitting: Internal Medicine

## 2016-05-14 ENCOUNTER — Ambulatory Visit: Payer: Commercial Managed Care - HMO | Admitting: Internal Medicine

## 2016-05-18 ENCOUNTER — Encounter: Payer: Self-pay | Admitting: Internal Medicine

## 2016-06-07 ENCOUNTER — Ambulatory Visit: Payer: Commercial Managed Care - HMO | Admitting: Internal Medicine

## 2016-06-12 ENCOUNTER — Ambulatory Visit: Payer: Commercial Managed Care - HMO | Admitting: Endocrinology

## 2016-07-04 ENCOUNTER — Other Ambulatory Visit: Payer: Self-pay | Admitting: Internal Medicine

## 2016-07-05 NOTE — Telephone Encounter (Signed)
Ok to fill all, current with visit

## 2016-07-16 ENCOUNTER — Ambulatory Visit: Payer: Commercial Managed Care - HMO | Admitting: Internal Medicine

## 2016-07-17 ENCOUNTER — Ambulatory Visit: Payer: Commercial Managed Care - HMO | Admitting: Endocrinology

## 2016-07-27 ENCOUNTER — Ambulatory Visit: Payer: Commercial Managed Care - HMO | Admitting: Internal Medicine

## 2016-07-31 ENCOUNTER — Ambulatory Visit: Payer: Commercial Managed Care - HMO | Admitting: Internal Medicine

## 2016-08-06 ENCOUNTER — Ambulatory Visit: Payer: Commercial Managed Care - HMO | Admitting: Endocrinology

## 2016-08-06 DIAGNOSIS — Z0289 Encounter for other administrative examinations: Secondary | ICD-10-CM

## 2016-08-23 ENCOUNTER — Telehealth: Payer: Self-pay | Admitting: Endocrinology

## 2016-08-23 ENCOUNTER — Other Ambulatory Visit: Payer: Self-pay | Admitting: Endocrinology

## 2016-08-23 MED ORDER — INSULIN NPH (HUMAN) (ISOPHANE) 100 UNIT/ML ~~LOC~~ SUSP: 45.0000 [IU] | SUBCUTANEOUS | 11 refills | Status: DC

## 2016-08-23 MED ORDER — "INSULIN SYRINGE 31G X 5/16"" 1 ML MISC": 2 refills | Status: DC

## 2016-08-23 NOTE — Telephone Encounter (Signed)
Refills submitted. Patient also wanted to make Dr. Loanne Drilling aware that he stopped taking his metformin due to it causing extreme stomach discomfort and pain. He states it is much better since coming off the medication.

## 2016-08-23 NOTE — Telephone Encounter (Signed)
**  Remind patient they can make refill requests via MyChart**  Medication refill request (Name & Dosage): Insulin Syringe-Needle U-100 (INSULIN SYRINGE 1CC/31GX5/16") 31G X 5/16" 1 ML MISC [[id number]]   insulin NPH Human (NOVOLIN N) 100 UNIT/ML injection [[id number]]    Preferred pharmacy (Name & Address): [place], [place] - [address] [phone] (Phone) [phone] (Fax)       Other comments (if applicable):   Patient also wanted to make Dr. Loanne Drilling aware that he stopped taking his metformin due to it causing extreme stomach discomfort and pain. He states it is much better since coming off the medication.

## 2016-09-06 ENCOUNTER — Ambulatory Visit: Payer: Commercial Managed Care - HMO | Admitting: Internal Medicine

## 2016-09-07 ENCOUNTER — Ambulatory Visit: Payer: Medicare HMO | Admitting: Endocrinology

## 2016-09-10 ENCOUNTER — Encounter (HOSPITAL_COMMUNITY): Payer: Self-pay | Admitting: Emergency Medicine

## 2016-09-10 ENCOUNTER — Encounter: Payer: Self-pay | Admitting: Endocrinology

## 2016-09-10 ENCOUNTER — Other Ambulatory Visit: Payer: Self-pay

## 2016-09-10 ENCOUNTER — Emergency Department (HOSPITAL_COMMUNITY)
Admission: EM | Admit: 2016-09-10 | Discharge: 2016-09-10 | Disposition: A | Discharge status: Home / Self Care | Payer: Medicare HMO | Attending: Emergency Medicine | Admitting: Emergency Medicine

## 2016-09-10 ENCOUNTER — Emergency Department (HOSPITAL_COMMUNITY): Payer: Medicare HMO

## 2016-09-10 ENCOUNTER — Ambulatory Visit (INDEPENDENT_AMBULATORY_CARE_PROVIDER_SITE_OTHER): Payer: Medicare HMO | Admitting: Endocrinology

## 2016-09-10 VITALS — BP 132/84 | HR 83 | Ht 66.0 in | Wt 189.0 lb

## 2016-09-10 DIAGNOSIS — I110 Hypertensive heart disease with heart failure: Secondary | ICD-10-CM | POA: Diagnosis not present

## 2016-09-10 DIAGNOSIS — J45909 Unspecified asthma, uncomplicated: Secondary | ICD-10-CM | POA: Insufficient documentation

## 2016-09-10 DIAGNOSIS — I5032 Chronic diastolic (congestive) heart failure: Secondary | ICD-10-CM | POA: Diagnosis not present

## 2016-09-10 DIAGNOSIS — E119 Type 2 diabetes mellitus without complications: Secondary | ICD-10-CM

## 2016-09-10 DIAGNOSIS — J449 Chronic obstructive pulmonary disease, unspecified: Secondary | ICD-10-CM | POA: Insufficient documentation

## 2016-09-10 DIAGNOSIS — Z794 Long term (current) use of insulin: Secondary | ICD-10-CM

## 2016-09-10 DIAGNOSIS — R112 Nausea with vomiting, unspecified: Secondary | ICD-10-CM | POA: Insufficient documentation

## 2016-09-10 DIAGNOSIS — Z7982 Long term (current) use of aspirin: Secondary | ICD-10-CM | POA: Diagnosis not present

## 2016-09-10 DIAGNOSIS — I2510 Atherosclerotic heart disease of native coronary artery without angina pectoris: Secondary | ICD-10-CM | POA: Insufficient documentation

## 2016-09-10 DIAGNOSIS — R1013 Epigastric pain: Secondary | ICD-10-CM | POA: Diagnosis not present

## 2016-09-10 DIAGNOSIS — Z87891 Personal history of nicotine dependence: Secondary | ICD-10-CM | POA: Insufficient documentation

## 2016-09-10 LAB — COMPREHENSIVE METABOLIC PANEL
ALT: 26 U/L (ref 17–63)
AST: 26 U/L (ref 15–41)
Albumin: 4.1 g/dL (ref 3.5–5.0)
Alkaline Phosphatase: 47 U/L (ref 38–126)
Anion gap: 8 (ref 5–15)
BUN: 16 mg/dL (ref 6–20)
CO2: 27 mmol/L (ref 22–32)
Calcium: 9.1 mg/dL (ref 8.9–10.3)
Chloride: 102 mmol/L (ref 101–111)
Creatinine, Ser: 0.97 mg/dL (ref 0.61–1.24)
GFR calc Af Amer: >60 mL/min (ref >60)
GFR calc non Af Amer: >60 mL/min (ref >60)
Glucose, Bld: 170 mg/dL — ABNORMAL HIGH (ref 65–99)
Potassium: 4.5 mmol/L (ref 3.5–5.1)
Sodium: 137 mmol/L (ref 135–145)
Total Bilirubin: 0.5 mg/dL (ref 0.3–1.2)
Total Protein: 7.2 g/dL (ref 6.5–8.1)

## 2016-09-10 LAB — CBC
HCT: 38.6 % — ABNORMAL LOW (ref 39.0–52.0)
Hemoglobin: 12.6 g/dL — ABNORMAL LOW (ref 13.0–17.0)
MCH: 27.5 pg (ref 26.0–34.0)
MCHC: 32.6 g/dL (ref 30.0–36.0)
MCV: 84.1 fL (ref 78.0–100.0)
Platelets: 273 10*3/uL (ref 150–400)
RBC: 4.59 MIL/uL (ref 4.22–5.81)
RDW: 14 % (ref 11.5–15.5)
WBC: 5.4 10*3/uL (ref 4.0–10.5)

## 2016-09-10 LAB — POCT GLYCOSYLATED HEMOGLOBIN (HGB A1C): Hemoglobin A1C: 7.1

## 2016-09-10 LAB — LIPASE, BLOOD: Lipase: 22 U/L (ref 11–51)

## 2016-09-10 MED ORDER — IOPAMIDOL (ISOVUE-300) INJECTION 61%
INTRAVENOUS | Status: AC
  Administered 2016-09-10: 100 mL
  Filled 2016-09-10: qty 100

## 2016-09-10 MED ORDER — FAMOTIDINE 40 MG PO TABS: 40.0000 mg | ORAL_TABLET | Freq: Every day | ORAL | 0 refills | Status: DC

## 2016-09-10 MED ORDER — "INSULIN SYRINGE 31G X 5/16"" 1 ML MISC": 2 refills | Status: DC

## 2016-09-10 MED ORDER — HYDROCODONE-ACETAMINOPHEN 5-325 MG PO TABS: 1.0000 | ORAL_TABLET | Freq: Four times a day (QID) | ORAL | 0 refills | Status: DC | PRN

## 2016-09-10 MED ORDER — HYDROMORPHONE HCL 1 MG/ML IJ SOLN
1.0000 mg | Freq: Once | INTRAMUSCULAR | Status: AC
  Administered 2016-09-10: 1 mg via INTRAVENOUS
  Filled 2016-09-10: qty 1

## 2016-09-10 MED ORDER — PANTOPRAZOLE SODIUM 40 MG IV SOLR
40.0000 mg | Freq: Once | INTRAVENOUS | Status: AC
  Administered 2016-09-10: 40 mg via INTRAVENOUS
  Filled 2016-09-10: qty 40

## 2016-09-10 MED ORDER — SODIUM CHLORIDE 0.9 % IV BOLUS (SEPSIS)
250.0000 mL | Freq: Once | INTRAVENOUS | Status: AC
  Administered 2016-09-10: 250 mL via INTRAVENOUS

## 2016-09-10 MED ORDER — INSULIN NPH (HUMAN) (ISOPHANE) 100 UNIT/ML ~~LOC~~ SUSP: 60.0000 [IU] | SUBCUTANEOUS | 11 refills | Status: DC

## 2016-09-10 MED ORDER — SODIUM CHLORIDE 0.9 % IV SOLN
INTRAVENOUS | Status: DC
  Administered 2016-09-10: 17:00:00 via INTRAVENOUS

## 2016-09-10 MED ORDER — ONDANSETRON HCL 4 MG/2ML IJ SOLN
4.0000 mg | Freq: Once | INTRAMUSCULAR | Status: AC
  Administered 2016-09-10: 4 mg via INTRAVENOUS
  Filled 2016-09-10: qty 2

## 2016-09-10 NOTE — ED Notes (Signed)
Patient verbalized understanding of discharge instructions and denies any further needs or questions at this time. VS stable. Patient ambulatory with steady gait.  

## 2016-09-10 NOTE — ED Notes (Signed)
MD stopped and spoke to pt.  Patient expressing frustrations with delays.  Pt taken to restroom, but no urine sample collected.  MD apologized.  This RN will apologize as well.

## 2016-09-10 NOTE — ED Provider Notes (Addendum)
[facility] DEPT Provider Note   CSN: [id number] Arrival date & time: 09/10/16  1010     History   Chief Complaint Chief Complaint  Patient presents with  . Abdominal Pain    HPI [name] is a 66 y.o. male.  Patient with complaint of burning inside and epigastric abdominal pain. 7 ongoing for 2 days. Vomited last night. No vomiting today. Similar symptoms occurred 2 years ago with extensive workup without any acute findings. Pain is severe as 10 out of 10 nonradiating. Patient without any chest discomfort at all.      Past Medical History:  Diagnosis Date  . Arthritis    "knees, hands" (03/08/2015)  . Asthma   . Chronic bronchitis ([place])   . Chronic diastolic CHF (congestive heart failure) ([place])    notes 03/08/2015  . COPD (chronic obstructive pulmonary disease) ([place])   . Coronary artery disease   . Hepatitis C   . High cholesterol   . Hypertension   . Pneumonia 03/2015  . Shortness of breath dyspnea   . Type II diabetes mellitus [facility])     Patient Active Problem List   Diagnosis Date Noted  . ED (erectile dysfunction) 01/09/2016  . Hepatic cirrhosis ([place]) 08/31/2015  . Routine general medical examination at a health care facility 06/24/2015  . Chronic diastolic CHF (congestive heart failure) ([place]) 03/08/2015  . Vocal cord dysfunction 03/08/2015  . Asthma with exacerbation 03/08/2015  . Insulin dependent type 2 diabetes mellitus ([place])   . PAD (peripheral artery disease) ([place])   . Hyperlipidemia   . Mild intermittent asthma 08/18/2013  . ASCVD (arteriosclerotic cardiovascular disease) 02/11/2013  . Osteoarthritis 10/27/2012  . Chronic hepatitis C without hepatic coma ([place]) 09/19/2012  . Former smoker 09/19/2012  . Essential hypertension, benign 08/20/2012  . Dyslipidemia 08/20/2012  . Depression 08/20/2012  . GERD (gastroesophageal reflux disease) 08/20/2012  . PVD (peripheral vascular disease) ([place]) 08/20/2012    Past Surgical History:  Procedure  Laterality Date  . CATARACT EXTRACTION Left 02/2014  . ESOPHAGOGASTRODUODENOSCOPY  02/09/2011   Procedure: ESOPHAGOGASTRODUODENOSCOPY (EGD);  Surgeon: Missy Sabins, MD;  [facility] END[facility];  Service: Endoscopy;  Laterality: N/A;  . JOINT REPLACEMENT    . TOTAL KNEE ARTHROPLASTY Left 10/22/2009       Home Medications    Prior to Admission medications   Medication Sig Start Date End Date Taking? Authorizing Provider  albuterol (VENTOLIN HFA) 108 (90 Base) MCG/ACT inhaler Inhale 1-2 puffs into the lungs every 4 (four) hours as needed for wheezing or shortness of breath. 01/09/16  Yes Hoyt Koch, MD  aspirin EC 81 MG tablet Take 1 tablet (81 mg total) by mouth daily. 08/31/13  Yes Advani, Vernon Prey, MD  cholecalciferol (VITAMIN D) 1000 UNITS tablet Take 1 tablet (1,000 Units total) by mouth daily. Patient taking differently: Take 1,000 Units by mouth 2 (two) times daily.  08/20/12  Yes Robbie Lis, MD  Cinnamon 500 MG capsule Take 500 mg by mouth 2 (two) times daily.   Yes [provider]  guaiFENesin-codeine 100-10 MG/5ML syrup Take 5 mLs by mouth every 6 (six) hours as needed for cough. 03/10/15  Yes Rai, Ripudeep K, MD  insulin NPH Human (NOVOLIN N) 100 UNIT/ML injection Inject 0.6 mLs (60 Units total) into the skin every morning. And syringes 1/day 09/10/16  Yes Renato Shin, MD  losartan-hydrochlorothiazide [facility]) 100-12.5 MG tablet Take 1 tablet by mouth daily. 01/09/16  Yes Hoyt Koch, MD  Multiple Vitamins-Minerals (MULTIVITAMIN  WITH MINERALS) tablet Take 1 tablet by mouth daily.   Yes [provider]  Omega-3 Fatty Acids (FISH OIL) 1000 MG CAPS Take 2 capsules by mouth daily.   Yes [provider]  sildenafil (VIAGRA) 100 MG tablet Take 0.5-1 tablets (50-100 mg total) by mouth daily as needed for erectile dysfunction. 01/09/16  Yes Hoyt Koch, MD  traZODone (DESYREL) 50 MG tablet TAKE 1 TABLET (50 MG TOTAL) BY MOUTH AT BEDTIME AS  NEEDED FOR SLEEP 07/05/16  Yes Hoyt Koch, MD  umeclidinium-vilanterol [facility] ELLIPTA) 62.5-25 MCG/INH AEPB INHALE ONE PUFF BY MOUTH  DAILY 01/09/16  Yes Hoyt Koch, MD  Alcohol Swabs (B-D SINGLE USE SWABS REGULAR) PADS Use to swab finger 3 times daily. 03/29/16   Hoyt Koch, MD  Blood Glucose Monitoring Suppl (TRUE METRIX AIR GLUCOSE METER) w/Device KIT 1 Device by Does not apply route 2 (two) times daily. 03/29/16   Hoyt Koch, MD  famotidine (PEPCID) 40 MG tablet Take 1 tablet (40 mg total) by mouth daily. 09/10/16   Fredia Sorrow, MD  HYDROcodone-acetaminophen (NORCO/VICODIN) 5-325 MG tablet Take 1-2 tablets by mouth every 6 (six) hours as needed for moderate pain. 09/10/16   Fredia Sorrow, MD  Insulin Pen Needle (NOVOFINE) 32G X 6 MM MISC Use to inject insulin 1 time per day. 07/11/15   Renato Shin, MD  Insulin Syringe-Needle U-100 (INSULIN SYRINGE 1CC/31GX5/16") 31G X 5/16" 1 ML MISC Use to inject insulin 1 time per day. 09/10/16   Renato Shin, MD  Ledipasvir-Sofosbuvir (HARVONI) 90-400 MG TABS Take 1 tablet by mouth daily. Patient not taking: Reported on 09/10/2016 08/31/15   Thayer Headings, MD  metFORMIN (GLUCOPHAGE) 1000 MG tablet TAKE 1 TABLET TWICE DAILY WITH A MEAL Patient not taking: Reported on 09/10/2016 07/05/16   Hoyt Koch, MD  TRUE METRIX BLOOD GLUCOSE TEST test strip CHECK BLOOD SUGAR 3 TIMES DAILY. 07/05/16   Hoyt Koch, MD  TRUEPLUS LANCETS 28G MISC CHECK BLOOD SUGAR 3 TIMES DAILY. 07/05/16   Hoyt Koch, MD    Family History Family History  Problem Relation Age of Onset  . Stroke Mother   . Diabetes Mother   . Hypertension Mother   . Hyperlipidemia Mother   . Heart disease Father   . Hypertension Father   . Heart attack Father     Social History Social History  Substance Use Topics  . Smoking status: Former Smoker    Packs/day: 1.50    Years: 46.00    Types: Cigarettes    Quit date: 03/19/2013  .  Smokeless tobacco: Never Used  . Alcohol use 0.0 oz/week     Comment: "recovering alcoholic; 03/10/2692"     Allergies   Crestor [rosuvastatin]; Metformin and related; and Mobic [meloxicam]   Review of Systems Review of Systems  Constitutional: Negative for fever.  HENT: Negative for congestion.   Eyes: Negative for redness.  Respiratory: Negative for shortness of breath.   Cardiovascular: Negative for chest pain.  Gastrointestinal: Positive for abdominal pain, nausea and vomiting. Negative for diarrhea.  Genitourinary: Negative for dysuria.  Musculoskeletal: Negative for back pain.  Skin: Negative for rash.  Neurological: Negative for headaches.  Hematological: Does not bruise/bleed easily.  Psychiatric/Behavioral: Negative for confusion.     Physical Exam Updated Vital Signs BP 114/67 (BP Location: Left Arm)   Pulse 70   Temp 98.5 F (36.9 C) (Oral)   Resp 17   SpO2 99%   Physical Exam  Constitutional: He is oriented to person, place, and time. He appears well-developed and well-nourished. No distress.  HENT:  Head: Normocephalic and atraumatic.  Mouth/Throat: Oropharynx is clear and moist.  Eyes: Conjunctivae and EOM are normal. Pupils are equal, round, and reactive to light.  Neck: Normal range of motion. Neck supple.  Cardiovascular: Normal rate and regular rhythm.   Pulmonary/Chest: Effort normal and breath sounds normal.  Abdominal: Soft. Bowel sounds are normal. There is tenderness.  Musculoskeletal: Normal range of motion.  Neurological: He is alert and oriented to person, place, and time. No cranial nerve deficit or sensory deficit. He exhibits normal muscle tone. Coordination normal.  Skin: Skin is warm.  Nursing note and vitals reviewed.    ED Treatments / Results  Labs (all labs ordered are listed, but only abnormal results are displayed) Labs Reviewed  COMPREHENSIVE METABOLIC PANEL - Abnormal; Notable for the following:       Result Value    Glucose, Bld 170 (*)    All other components within normal limits  CBC - Abnormal; Notable for the following:    Hemoglobin 12.6 (*)    HCT 38.6 (*)    All other components within normal limits  LIPASE, BLOOD  URINALYSIS, ROUTINE W REFLEX MICROSCOPIC   Results for orders placed or performed during the hospital encounter of 09/10/16  Lipase, blood  Result Value Ref Range   Lipase 22 11 - 51 U/L  Comprehensive metabolic panel  Result Value Ref Range   Sodium 137 135 - 145 mmol/L   Potassium 4.5 3.5 - 5.1 mmol/L   Chloride 102 101 - 111 mmol/L   CO2 27 22 - 32 mmol/L   Glucose, Bld 170 (H) 65 - 99 mg/dL   BUN 16 6 - 20 mg/dL   Creatinine, Ser 0.97 0.61 - 1.24 mg/dL   Calcium 9.1 8.9 - 10.3 mg/dL   Total Protein 7.2 6.5 - 8.1 g/dL   Albumin 4.1 3.5 - 5.0 g/dL   AST 26 15 - 41 U/L   ALT 26 17 - 63 U/L   Alkaline Phosphatase 47 38 - 126 U/L   Total Bilirubin 0.5 0.3 - 1.2 mg/dL   GFR calc non Af Amer >60 >60 mL/min   GFR calc Af Amer >60 >60 mL/min   Anion gap 8 5 - 15  CBC  Result Value Ref Range   WBC 5.4 4.0 - 10.5 K/uL   RBC 4.59 4.22 - 5.81 MIL/uL   Hemoglobin 12.6 (L) 13.0 - 17.0 g/dL   HCT 38.6 (L) 39.0 - 52.0 %   MCV 84.1 78.0 - 100.0 fL   MCH 27.5 26.0 - 34.0 pg   MCHC 32.6 30.0 - 36.0 g/dL   RDW 14.0 11.5 - 15.5 %   Platelets 273 150 - 400 K/uL     EKG  EKG Interpretation  Date/Time:  Monday September 10 2016 16:44:13 EDT Ventricular Rate:  68 PR Interval:  162 QRS Duration: 82 QT Interval:  398 QTC Calculation: 423 R Axis:   58 Text Interpretation:  Normal sinus rhythm Normal ECG Confirmed by Fredia Sorrow [phone]) on 09/10/2016 4:59:28 PM       Radiology Ct Abdomen Pelvis W Contrast  Result Date: 09/10/2016 CLINICAL DATA:  Recurrent epigastric abdomen pain EXAM: CT ABDOMEN AND PELVIS WITH CONTRAST TECHNIQUE: Multidetector CT imaging of the abdomen and pelvis was performed using the standard protocol following bolus administration of intravenous  contrast. CONTRAST:  17m ISOVUE-300 IOPAMIDOL (ISOVUE-300) INJECTION 61% COMPARISON:  Jul 17, 2011 FINDINGS: Lower chest: No acute abnormality. Hepatobiliary: No focal liver abnormality is seen. Tiny calcified granuloma is identified in the liver. No gallstones, gallbladder wall thickening, or biliary dilatation. Pancreas: Unremarkable. No pancreatic ductal dilatation or surrounding inflammatory changes. Spleen: Normal in size without focal abnormality. Adrenals/Urinary Tract: The adrenal glands are normal. Bilateral kidney cysts are unchanged compared prior CT. There is no hydronephrosis bilaterally. The bladder is normal. Stomach/Bowel: There is a small hiatal hernia. Appendix appears normal. No evidence of bowel wall thickening, distention, or inflammatory changes. Vascular/Lymphatic: Aortic atherosclerosis. No enlarged abdominal or pelvic lymph nodes. Reproductive: Prostate is unremarkable. Other: No abdominal wall hernia or abnormality. No abdominopelvic ascites. Musculoskeletal: Mild degenerative joint changes of the spine are identified. IMPRESSION: No acute abnormality identified in the abdomen and pelvis. No evidence of pancreatitis. Electronically Signed   By: Abelardo Diesel M.D.   On: 09/10/2016 18:25    Procedures Procedures (including critical care time)  Medications Ordered in ED Medications  0.9 %  sodium chloride infusion ( Intravenous New Bag/Given 09/10/16 1713)  ondansetron (ZOFRAN) injection 4 mg (4 mg Intravenous Given 09/10/16 1713)  HYDROmorphone (DILAUDID) injection 1 mg (1 mg Intravenous Given 09/10/16 1713)  sodium chloride 0.9 % bolus 250 mL (0 mLs Intravenous Stopped 09/10/16 1950)  pantoprazole (PROTONIX) injection 40 mg (40 mg Intravenous Given 09/10/16 1713)  iopamidol (ISOVUE-300) 61 % injection (100 mLs  Contrast Given 09/10/16 1747)     Initial Impression / Assessment and Plan / ED Course  I have reviewed the triage vital signs and the nursing notes.  Pertinent labs &  imaging results that were available during my care of the patient were reviewed by me and considered in my medical decision making (see chart for details).    Workup for the abdominal pain which is predominantly epigastric without acute findings. CT scan negative lipase normal. EKG without acute findings. Suspect maybe peptic ulcer disease. Patient had similar pain complaints in the past. Not recently done a sniffing findings. Will treat with Pepcid and have him follow-up with primary care doctor may require repeat EGD. A shunt nontoxic no acute distress.  Patient does not want to wait to provide urine. Patient states he wants to forego the urinalysis and wants to be discharged home. Patient aware that the not able to completely rule out the urinary tract infection or prostate infection. But based on the location of abdominal pain unlikely.  Patient improved with pain medicine here.   Final Clinical Impressions(s) / ED Diagnoses   Final diagnoses:  Epigastric pain    New Prescriptions New Prescriptions   FAMOTIDINE (PEPCID) 40 MG TABLET    Take 1 tablet (40 mg total) by mouth daily.   HYDROCODONE-ACETAMINOPHEN (NORCO/VICODIN) 5-325 MG TABLET    Take 1-2 tablets by mouth every 6 (six) hours as needed for moderate pain.     Fredia Sorrow, MD 09/10/16 1610    Fredia Sorrow, MD 09/10/16 2101

## 2016-09-10 NOTE — ED Notes (Signed)
Patient thankful for apologies and thankful for care.  Updated and will be d/c'd shortly by MD

## 2016-09-10 NOTE — Discharge Instructions (Signed)
Take the Pepcid as directed for the next 2 weeks. Take pain medicine as needed. Make an appointment to follow-up with your doctor. May require another endoscopy if you do not improve on the Pepcid to rule out peptic ulcer disease. Today's CT without any acute findings.

## 2016-09-10 NOTE — ED Triage Notes (Signed)
Pt. Stated, I've had the same symptoms for years. It comes around all the time. I've had all kinds of test.  I just have abdominal pain. Started real bad this morning.

## 2016-09-10 NOTE — Patient Instructions (Addendum)
check your blood sugar twice a day.  vary the time of day when you check, between before the 3 meals, and at bedtime.  also check if you have symptoms of your blood sugar being too high or too low.  please keep a record of the readings and bring it to your next appointment here (or you can bring the meter itself).  You can write it on any piece of paper.  please call us sooner if your blood sugar goes below 70, or if you have a lot of readings over 200. Please continue the NPH insulin, 60 units each morning On this type of insulin schedule, you should eat meals on a regular schedule.  If a meal is missed or significantly delayed, your blood sugar could go low.  Please come back for a follow-up appointment 4 months.      

## 2016-09-10 NOTE — Progress Notes (Signed)
Subjective:    Patient ID: [name], male    DOB: [birth year], 66 y.o.   MRN: [id number]  HPI Pt returns for f/u of diabetes mellitus: DM type: Insulin-requiring type 2 Dx'ed: 7096 Complications: PAD Therapy: insulin since 2016.   DKA: never Severe hypoglycemia: never Pancreatitis: never Other: he chooses qd insulin; he is chronically off and on prednisone, for asthma; he changed lantus to NPH, due to pattern of cbg's; ins declines levemir.    Interval history: he stopped metformin, due to GI sxs, but chronic abd pain persists.  He increased NPH insulin to 60 units qam.  He brings his meter with his cbg's which I have reviewed today.  All are in the low to mid-100's.  There is no trend throughout the day.  Past Medical History:  Diagnosis Date  . Arthritis    "knees, hands" (03/08/2015)  . Asthma   . Chronic bronchitis ([place])   . Chronic diastolic CHF (congestive heart failure) ([place])    notes 03/08/2015  . COPD (chronic obstructive pulmonary disease) ([place])   . Coronary artery disease   . Hepatitis C   . High cholesterol   . Hypertension   . Pneumonia 03/2015  . Shortness of breath dyspnea   . Type II diabetes mellitus ([place])     Past Surgical History:  Procedure Laterality Date  . CATARACT EXTRACTION Left 02/2014  . ESOPHAGOGASTRODUODENOSCOPY  02/09/2011   Procedure: ESOPHAGOGASTRODUODENOSCOPY (EGD);  Surgeon: Missy Sabins, MD;  Location[facility] END[facility]COPY;  Service: Endoscopy;  Laterality: N/A;  . JOINT REPLACEMENT    . 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 Marital status: Divorced    Spouse name: N/A  . Number of children: N/A  . Years of education: N/A   Occupational History  . Not on file.   Social History Main Topics  . Smoking status: Former Smoker    Packs/day: 1.50    Years: 46.00    Types: Cigarettes    Quit date: 03/19/2013  . Smokeless tobacco: Never Used  . Alcohol use 0.0 oz/week     Comment: "recovering  alcoholic; 2/83/6629"  . Drug use: No  . Sexual activity: Not Currently   Other Topics Concern  . Not on file   Social History Narrative  . No narrative on file    Current Outpatient Prescriptions on File Prior to Visit  Medication Sig Dispense Refill  . albuterol (VENTOLIN HFA) 108 (90 Base) MCG/ACT inhaler Inhale 1-2 puffs into the lungs every 4 (four) hours as needed for wheezing or shortness of breath. 54 each 3  . Alcohol Swabs (B-D SINGLE USE SWABS REGULAR) PADS Use to swab finger 3 times daily. 300 each 0  . aspirin EC 81 MG tablet Take 1 tablet (81 mg total) by mouth daily. 30 tablet 3  . Blood Glucose Monitoring Suppl (TRUE METRIX AIR GLUCOSE METER) w/Device KIT 1 Device by Does not apply route 2 (two) times daily. 1 kit 0  . cholecalciferol (VITAMIN D) 1000 UNITS tablet Take 1 tablet (1,000 Units total) by mouth daily. (Patient taking differently: 2,000 Units daily. ) 30 tablet 0  . Cinnamon 500 MG capsule Take 500 mg by mouth 2 (two) times daily.    [name] guaiFENesin-codeine 100-10 MG/5ML syrup Take 5 mLs by mouth every 6 (six) hours as needed for cough. 120 mL 0  . insulin NPH Human (NOVOLIN N) 100 UNIT/ML injection Inject 0.45 mLs (45 Units total)  into the skin every morning. And syringes 1/day 20 mL 11  . Insulin Pen Needle (NOVOFINE) 32G X 6 MM MISC Use to inject insulin 1 time per day. 90 each 2  . Insulin Syringe-Needle U-100 (INSULIN SYRINGE 1CC/31GX5/16") 31G X 5/16" 1 ML MISC Use to inject insulin 1 time per day. 90 each 2  . Ledipasvir-Sofosbuvir (HARVONI) 90-400 MG TABS Take 1 tablet by mouth daily. 28 tablet 2  . losartan-hydrochlorothiazide (HYZAAR) 100-12.5 MG tablet Take 1 tablet by mouth daily. 90 tablet 3  . Multiple Vitamins-Minerals (MULTIVITAMIN WITH MINERALS) tablet Take 1 tablet by mouth daily.    . Omega-3 Fatty Acids (FISH OIL) 1000 MG CAPS Take 2 capsules by mouth daily.    . sildenafil (VIAGRA) 100 MG tablet Take 0.5-1 tablets (50-100 mg total) by mouth  daily as needed for erectile dysfunction. 5 tablet 11  . traZODone (DESYREL) 50 MG tablet TAKE 1 TABLET (50 MG TOTAL) BY MOUTH AT BEDTIME AS NEEDED FOR SLEEP 90 tablet 0  . TRUE METRIX BLOOD GLUCOSE TEST test strip CHECK BLOOD SUGAR 3 TIMES DAILY. 300 each 0  . TRUEPLUS LANCETS 28G MISC CHECK BLOOD SUGAR 3 TIMES DAILY. 300 each 0  . umeclidinium-vilanterol (ANORO ELLIPTA) 62.5-25 MCG/INH AEPB INHALE ONE PUFF BY MOUTH  DAILY 90 each 3  . metFORMIN (GLUCOPHAGE) 1000 MG tablet TAKE 1 TABLET TWICE DAILY WITH A MEAL (Patient not taking: Reported on 09/10/2016) 180 tablet 0   No current facility-administered medications on file prior to visit.     Allergies  Allergen Reactions  . Crestor [Rosuvastatin] Other (See Comments)    INTOLERANCE   . Mobic [Meloxicam] Rash    Family History  Problem Relation Age of Onset  . Stroke Mother   . Diabetes Mother   . Hypertension Mother   . Hyperlipidemia Mother   . Heart disease Father   . Hypertension Father   . Heart attack Father     BP 132/84   Pulse 83   Ht '5\' 6"'  (1.676 m)   Wt 189 lb (85.7 kg)   SpO2 93%   BMI 30.51 kg/m    Review of Systems He denies hypoglycemia    Objective:   Physical Exam VITAL SIGNS:  See vs page GENERAL: no distress Pulses: dorsalis pedis intact bilat.   MSK: no deformity of the feet CV: no leg edema Skin:  no ulcer on the feet.  normal color and temp on the feet. Neuro: sensation is intact to touch on the feet.   A1c=7.1%     Assessment & Plan:  Insulin-requiring type 2 DM, with PAD: this is the best control this pt should aim for, given this regimen, which does match insulin to his changing needs throughout the day. abd pain, new to me: stay off metformin.    Patient Instructions  check your blood sugar twice a day.  vary the time of day when you check, between before the 3 meals, and at bedtime.  also check if you have symptoms of your blood sugar being too high or too low.  please keep a record  of the readings and bring it to your next appointment here (or you can bring the meter itself).  You can write it on any piece of paper.  please call us sooner if your blood sugar goes below 70, or if you have a lot of readings over 200. Please continue the NPH insulin, 60 units each morning On this type of insulin schedule, you should  eat meals on a regular schedule.  If a meal is missed or significantly delayed, your blood sugar could go low.  Please come back for a follow-up appointment 4 months.

## 2016-09-26 ENCOUNTER — Ambulatory Visit: Payer: Commercial Managed Care - HMO | Admitting: Internal Medicine

## 2016-09-26 ENCOUNTER — Ambulatory Visit: Payer: Commercial Managed Care - HMO | Admitting: Nurse Practitioner

## 2016-10-17 LAB — FECAL OCCULT BLOOD, GUAIAC: Fecal Occult Blood: NEGATIVE

## 2016-10-19 ENCOUNTER — Ambulatory Visit: Payer: Medicare HMO | Admitting: Endocrinology

## 2016-10-19 DIAGNOSIS — Z0289 Encounter for other administrative examinations: Secondary | ICD-10-CM

## 2016-10-23 ENCOUNTER — Encounter: Payer: Self-pay | Admitting: Internal Medicine

## 2016-10-23 NOTE — Progress Notes (Signed)
Abstracted and sent to scan  

## 2016-10-24 ENCOUNTER — Encounter: Payer: Self-pay | Admitting: Internal Medicine

## 2016-10-24 ENCOUNTER — Ambulatory Visit (INDEPENDENT_AMBULATORY_CARE_PROVIDER_SITE_OTHER): Payer: Medicare HMO | Admitting: Internal Medicine

## 2016-10-24 ENCOUNTER — Other Ambulatory Visit: Payer: Self-pay | Admitting: Pharmacist

## 2016-10-24 VITALS — BP 134/70 | HR 72 | Ht 64.0 in | Wt 191.0 lb

## 2016-10-24 DIAGNOSIS — B182 Chronic viral hepatitis C: Secondary | ICD-10-CM

## 2016-10-24 DIAGNOSIS — R1012 Left upper quadrant pain: Secondary | ICD-10-CM

## 2016-10-24 DIAGNOSIS — R1013 Epigastric pain: Secondary | ICD-10-CM

## 2016-10-24 NOTE — Patient Instructions (Signed)

## 2016-10-24 NOTE — Progress Notes (Signed)
HISTORY OF PRESENT ILLNESS:  [name] is a 66 y.o. male with history of drug and alcohol abuse, COPD, congestive heart failure, reactive airway disease, hypertension, hyperlipidemia, diabetes mellitus, and hepatitis C for which she has been evaluated and treated by Dr. Linus Owens of infectious diseases. Patient sent today by his primary care provider Dr. Sharlet Owens with chief complaint of abdominal pain. Also, infectious diseases evaluation regarding his liver disease. First, patient tells me that he has had a 15 year history of intermittent problems with left upper quadrant pain. This can be severe at times and is described as burning or a bubble. He feels a pressure-like sensation. Discomfort is improved when he stretches his left arm above his head or vigorously punches his left upper abdomen. He denies that his discomfort is affected by meals or bowel movements. He may have discomfort that lasts for hours. He can go months at a time without issues. He denies active reflux symptoms and weight loss. He does take Pepcid daily. He was evaluated in the emergency room for this abdominal pain on multiple occasions. Most recently July 2018. CT scan of the abdomen and pelvis March 2017 suggested early cirrhotic change but no other abnormalities. MRI of the abdomen July 2017 was said to show cirrhosis with hepatic steatosis but no other significant abnormalities. Incidental pancreas divisum. CT scan in 2013 negative. During his most recent evaluation CT scan in July 2018 was unremarkable. No evidence for cirrhosis or portal hypertension or other abnormality to explain his pain. Laboratories were unremarkable including CBC, liver tests, and lipase. He does tell me that "pain pills" help. In terms of hepatitis C he was treated with course of Harvoni. Seemed to have good response. Has not been to his follow-up as requested. He intends on returning to ID. In terms of colon cancer screening strategies the patient has elected  for FIT stool testing. Testing in October 2017 was normal. He tells me that he has had follow-up repeat testing recently this year which was also negative or normal. No family history of colon cancer. Family. The patient did have a Mallory-Weiss tear in 2012. Endoscope with Dr. Teena Owens. Required Endo clipping  REVIEW OF SYSTEMS:  All non-GI ROS negative except for arthritis, back pain, itching, cramps, shortness of breath, skin rash  Past Medical History:  Diagnosis Date  . Alcoholism ([place])   . Arthritis    "knees, hands" (03/08/2015)  . Asthma   . Chronic bronchitis ([place])   . Chronic diastolic CHF (congestive heart failure) ([place])    notes 03/08/2015  . Cirrhosis ([place])   . COPD (chronic obstructive pulmonary disease) ([place])   . Coronary artery disease   . Hepatitis C   . High cholesterol   . Hypertension   . Pneumonia 03/2015  . Shortness of breath dyspnea   . Type II diabetes mellitus ([place])     Past Surgical History:  Procedure Laterality Date  . CATARACT EXTRACTION Left 02/2014  . ESOPHAGOGASTRODUODENOSCOPY  02/09/2011   Procedure: ESOPHAGOGASTRODUODENOSCOPY (EGD);  Surgeon: Missy Sabins, MD;  Location: [facility] ENDOSCOPY;  Service: Endoscopy;  Laterality: N/A;  . TOTAL KNEE ARTHROPLASTY Left 10/22/2009    Social History [name]  reports that he quit smoking about 3 years ago. His smoking use included Cigarettes. He has a 69.00 pack-year smoking history. He has never used smokeless tobacco. He reports that he does not drink alcohol or use drugs.  family history includes Diabetes in his mother; Heart attack in his  father; Heart disease in his father; Hyperlipidemia in his mother; Hypertension in his father and mother; Stroke in his mother.  Allergies  Allergen Reactions  . Crestor [Rosuvastatin] Other (See Comments)    INTOLERANCE   . Metformin And Related Other (See Comments)    Stomach ache  . Mobic [Meloxicam] Rash       PHYSICAL EXAMINATION: Vital signs: BP 134/70  (BP Location: Left Arm, Patient Position: Sitting, Cuff Size: Normal)   Pulse 72   Ht 5\' 4"  (1.626 m) Comment: height measured without shoes  Wt 191 lb (86.6 kg)   BMI 32.79 kg/m   Constitutional: Overweight, generally well-appearing, no acute distress Psychiatric: alert and oriented x3, cooperative Eyes: extraocular movements intact, anicteric, conjunctiva pink Mouth: oral pharynx moist, no lesions Neck: supple without thyromegaly Lymph: no lymphadenopathy Cardiovascular: heart regular rate and rhythm, no murmur Lungs: clear to auscultation bilaterally Abdomen: soft, obese, nontender, nondistended, no obvious ascites, no peritoneal signs, normal bowel sounds, no organomegaly Rectal: Omitted, Extremities: no clubbing cyanosis or lower extremity edema bilaterally Skin: no lesions on visible extremities Neuro: No focal deficits. Cranial nerves intact. No asterixis.    ASSESSMENT:  #1. Chronic intermittent left upper quadrant pain. Multiple negative workups. Most consistent with musculoskeletal based on history and negative evaluations. Upper GI mucosal lesion possible but is likely. Patient is concerned #2. History of hepatitis C. Treated with Harvoni. Most recent imaging studies do not show obvious cirrhosis. No evidence of portal hypertension. Laboratory profile was unremarkable including normal liver tests, albumin, platelets. Does not appear to have clinically significant cirrhosis if cirrhosis is present. #3. Multiple medical problems including insulin requiring diabetes mellitus #4. History of Mallory-Weiss tear #5. Colon cancer screening up-to-date with FIT strategy per patient and his PCP   PLAN:  #1. Return to ID regarding monitoring of chronic hepatitis C per Dr.Comer #2. Recommend upper endoscopy to evaluate chronic recurrent upper abdominal pain. Low yield but has not been performed. Rule out ulcer. High risk given comorbidities and the need to address and adjust diabetic  medical therapy to avoid a month and hypoglycemia. #3. Continue his colon cancer screening strategy with his PCP. I did remind him that he should undergo FIT testing annually. I also advised him that if this were positive would require colonoscopy.[name] He understood #4. Hold a.m. insulin the morning of his procedure to avoid and wanted hypoglycemia.  A copy of this consultation note has been sent to Dr. Sharlet Owens and Dr. Linus Owens

## 2016-10-25 ENCOUNTER — Other Ambulatory Visit: Payer: Medicare HMO

## 2016-10-25 ENCOUNTER — Telehealth: Payer: Self-pay | Admitting: Internal Medicine

## 2016-10-25 DIAGNOSIS — B182 Chronic viral hepatitis C: Secondary | ICD-10-CM | POA: Diagnosis not present

## 2016-10-29 LAB — HEPATITIS C RNA QUANTITATIVE
HCV QUANT LOG: NOT DETECTED {Log_IU}/mL
HCV Quantitative: <15 IU/mL

## 2016-10-30 ENCOUNTER — Telehealth: Payer: Self-pay | Admitting: Pharmacist

## 2016-10-30 NOTE — Telephone Encounter (Signed)
Called [name] to let him know he is cured of his Hepatitis C. Told him to let us know if he needs anything in the future.

## 2016-10-31 NOTE — Telephone Encounter (Signed)
noted 

## 2016-11-02 ENCOUNTER — Telehealth: Payer: Self-pay | Admitting: Internal Medicine

## 2016-11-02 NOTE — Telephone Encounter (Signed)
Patient called to cancel procedure on 11/06/16 due to not being able to pay the service he was using to bring him. Patient reschedule to 10.22.18.

## 2016-11-06 ENCOUNTER — Encounter: Payer: Medicare HMO | Admitting: Internal Medicine

## 2016-11-07 ENCOUNTER — Encounter: Payer: Self-pay | Admitting: Nurse Practitioner

## 2016-11-07 ENCOUNTER — Ambulatory Visit (INDEPENDENT_AMBULATORY_CARE_PROVIDER_SITE_OTHER): Payer: Medicare HMO | Admitting: Nurse Practitioner

## 2016-11-07 VITALS — BP 154/80 | HR 69 | Temp 97.8°F | Ht 64.0 in | Wt 185.0 lb

## 2016-11-07 DIAGNOSIS — N529 Male erectile dysfunction, unspecified: Secondary | ICD-10-CM | POA: Diagnosis not present

## 2016-11-07 DIAGNOSIS — E782 Mixed hyperlipidemia: Secondary | ICD-10-CM

## 2016-11-07 DIAGNOSIS — I10 Essential (primary) hypertension: Secondary | ICD-10-CM

## 2016-11-07 DIAGNOSIS — J4521 Mild intermittent asthma with (acute) exacerbation: Secondary | ICD-10-CM

## 2016-11-07 DIAGNOSIS — Z23 Encounter for immunization: Secondary | ICD-10-CM

## 2016-11-07 MED ORDER — SILDENAFIL CITRATE 100 MG PO TABS: 50.0000 mg | ORAL_TABLET | Freq: Every day | ORAL | 3 refills | Status: DC | PRN

## 2016-11-07 NOTE — Progress Notes (Signed)
Subjective:  Patient ID: [name], male    DOB: 1950-09-06  Age: 66 y.o. MRN: [id number]  CC: Follow-up (3 mo fu/ has appt with the GI next month/ flu shot?)   HPI  HTN: Home BP readings of 150s/80s - 130s/80s. He has made changes to diet and exercises daily. No longer taking losartan. BP Readings from Last 3 Encounters:  11/07/16 (!) 154/80  10/24/16 134/70  09/10/16 131/74   ED: Evaluated by urology. Machine recommended but caused pain. Will like Viagra prescription.  Hyperlipidemia: Need repeat lipid panel.  ABD pain: Chronic x 3year, intermittent.with food intake Evaluated by GI (Dr.Perry) No aciute finding with CT and MRI. Had to cancel upper endoscopy due to high copay.  Asthma: Stable with use of anoro. Has not needed albuterol inhaler.  DM: Managed by Dr. Loanne Drilling. Up to date with eye exam (report requested)  Outpatient Medications Prior to Visit  Medication Sig Dispense Refill  . albuterol (VENTOLIN HFA) 108 (90 Base) MCG/ACT inhaler Inhale 1-2 puffs into the lungs every 4 (four) hours as needed for wheezing or shortness of breath. 54 each 3  . Alcohol Swabs (B-D SINGLE USE SWABS REGULAR) PADS Use to swab finger 3 times daily. 300 each 0  . aspirin EC 81 MG tablet Take 1 tablet (81 mg total) by mouth daily. 30 tablet 3  . Blood Glucose Monitoring Suppl (TRUE METRIX AIR GLUCOSE METER) w/Device KIT 1 Device by Does not apply route 2 (two) times daily. 1 kit 0  . cholecalciferol (VITAMIN D) 1000 UNITS tablet Take 1 tablet (1,000 Units total) by mouth daily. (Patient taking differently: Take 1,000 Units by mouth 2 (two) times daily. ) 30 tablet 0  . Cinnamon 500 MG capsule Take 500 mg by mouth 2 (two) times daily.    [name] guaiFENesin-codeine 100-10 MG/5ML syrup Take 5 mLs by mouth every 6 (six) hours as needed for cough. 120 mL 0  . insulin NPH Human (NOVOLIN N) 100 UNIT/ML injection Inject 0.6 mLs (60 Units total) into the skin every morning. And syringes 1/day  20 mL 11  . Insulin Pen Needle (NOVOFINE) 32G X 6 MM MISC Use to inject insulin 1 time per day. 90 each 2  . Insulin Syringe-Needle U-100 (INSULIN SYRINGE 1CC/31GX5/16") 31G X 5/16" 1 ML MISC Use to inject insulin 1 time per day. 100 each 2  . Multiple Vitamins-Minerals (MULTIVITAMIN WITH MINERALS) tablet Take 1 tablet by mouth daily.    . Omega-3 Fatty Acids (FISH OIL) 1000 MG CAPS Take 2 capsules by mouth daily.    . traZODone (DESYREL) 50 MG tablet TAKE 1 TABLET (50 MG TOTAL) BY MOUTH AT BEDTIME AS NEEDED FOR SLEEP 90 tablet 0  . TRUE METRIX BLOOD GLUCOSE TEST test strip CHECK BLOOD SUGAR 3 TIMES DAILY. 300 each 0  . TRUEPLUS LANCETS 28G MISC CHECK BLOOD SUGAR 3 TIMES DAILY. 300 each 0  . sildenafil (VIAGRA) 100 MG tablet Take 0.5-1 tablets (50-100 mg total) by mouth daily as needed for erectile dysfunction. 5 tablet 11  . umeclidinium-vilanterol (ANORO ELLIPTA) 62.5-25 MCG/INH AEPB INHALE ONE PUFF BY MOUTH  DAILY 90 each 3  . famotidine (PEPCID) 40 MG tablet Take 1 tablet (40 mg total) by mouth daily. (Patient not taking: Reported on 11/07/2016) 28 tablet 0   No facility-administered medications prior to visit.     ROS Review of Systems  Respiratory: Negative for cough and shortness of breath.   Cardiovascular: Negative for chest pain, palpitations and leg  swelling.  Gastrointestinal: Positive for abdominal pain. Negative for blood in stool, constipation, diarrhea and melena.  Genitourinary: Negative for dysuria, frequency and urgency.  Musculoskeletal: Negative for myalgias.  Skin: Negative.   Psychiatric/Behavioral: Negative for depression. The patient is not nervous/anxious.     Objective:  BP (!) 154/80   Pulse 69   Temp 97.8 F (36.6 C)   Ht '5\' 4"'  (1.626 m)   Wt 185 lb (83.9 kg)   SpO2 99%   BMI 31.76 kg/m   BP Readings from Last 3 Encounters:  11/07/16 (!) 154/80  10/24/16 134/70  09/10/16 131/74    Wt Readings from Last 3 Encounters:  11/07/16 185 lb (83.9 kg)    10/24/16 191 lb (86.6 kg)  09/10/16 189 lb (85.7 kg)    Physical Exam  Constitutional: He is oriented to person, place, and time. No distress.  Cardiovascular: Normal rate and regular rhythm.   Pulmonary/Chest: Effort normal and breath sounds normal.  Neurological: He is alert and oriented to person, place, and time.  Psychiatric: He has a normal mood and affect. His behavior is normal.  Vitals reviewed.   Lab Results  Component Value Date   WBC 5.4 09/10/2016   HGB 12.6 (L) 09/10/2016   HCT 38.6 (L) 09/10/2016   PLT 273 09/10/2016   GLUCOSE 170 (H) 09/10/2016   CHOL 150 12/15/2014   TRIG 96.0 12/15/2014   HDL 47.60 12/15/2014   LDLCALC 83 12/15/2014   ALT 26 09/10/2016   AST 26 09/10/2016   NA 137 09/10/2016   K 4.5 09/10/2016   CL 102 09/10/2016   CREATININE 0.97 09/10/2016   BUN 16 09/10/2016   CO2 27 09/10/2016   TSH 0.666 12/01/2013   INR 1.08 05/27/2015   HGBA1C 7.1 09/10/2016   MICROALBUR <0.7 11/09/2015    Ct Abdomen Pelvis W Contrast  Result Date: 09/10/2016 CLINICAL DATA:  Recurrent epigastric abdomen pain EXAM: CT ABDOMEN AND PELVIS WITH CONTRAST TECHNIQUE: Multidetector CT imaging of the abdomen and pelvis was performed using the standard protocol following bolus administration of intravenous contrast. CONTRAST:  122m ISOVUE-300 IOPAMIDOL (ISOVUE-300) INJECTION 61% COMPARISON:  Jul 17, 2011 FINDINGS: Lower chest: No acute abnormality. Hepatobiliary: No focal liver abnormality is seen. Tiny calcified granuloma is identified in the liver. No gallstones, gallbladder wall thickening, or biliary dilatation. Pancreas: Unremarkable. No pancreatic ductal dilatation or surrounding inflammatory changes. Spleen: Normal in size without focal abnormality. Adrenals/Urinary Tract: The adrenal glands are normal. Bilateral kidney cysts are unchanged compared prior CT. There is no hydronephrosis bilaterally. The bladder is normal. Stomach/Bowel: There is a small hiatal hernia.  Appendix appears normal. No evidence of bowel wall thickening, distention, or inflammatory changes. Vascular/Lymphatic: Aortic atherosclerosis. No enlarged abdominal or pelvic lymph nodes. Reproductive: Prostate is unremarkable. Other: No abdominal wall hernia or abnormality. No abdominopelvic ascites. Musculoskeletal: Mild degenerative joint changes of the spine are identified. IMPRESSION: No acute abnormality identified in the abdomen and pelvis. No evidence of pancreatitis. Electronically Signed   By: WAbelardo DieselM.D.   On: 09/10/2016 18:25    Assessment & Plan:   RLeopoldowas seen today for follow-up.  Diagnoses and all orders for this visit:  Mixed hyperlipidemia -     Lipid panel; Future  Essential hypertension, benign  Vasculogenic erectile dysfunction, unspecified vasculogenic erectile dysfunction type -     sildenafil (VIAGRA) 100 MG tablet; Take 0.5-1 tablets (50-100 mg total) by mouth daily as needed for erectile dysfunction.  Need for influenza vaccination -  Flu vaccine HIGH DOSE PF  Need for vaccination with 13-polyvalent pneumococcal conjugate vaccine -     Pneumococcal conjugate vaccine 13-valent IM  Mild intermittent asthma with acute exacerbation -     umeclidinium-vilanterol (ANORO ELLIPTA) 62.5-25 MCG/INH AEPB; INHALE ONE PUFF BY MOUTH  DAILY   I am having [name] maintain his cholecalciferol, aspirin EC, guaiFENesin-codeine, Fish Oil, Cinnamon, multivitamin with minerals, Insulin Pen Needle, albuterol, TRUE METRIX AIR GLUCOSE METER, B-D SINGLE USE SWABS REGULAR, traZODone, TRUE METRIX BLOOD GLUCOSE TEST, TRUEPLUS LANCETS 28G, insulin NPH Human, INSULIN SYRINGE 1CC/31GX5/16", famotidine, sildenafil, and umeclidinium-vilanterol.  Meds ordered this encounter  Medications  . sildenafil (VIAGRA) 100 MG tablet    Sig: Take 0.5-1 tablets (50-100 mg total) by mouth daily as needed for erectile dysfunction.    Dispense:  5 tablet    Refill:  3    Order Specific  Question:   Supervising Provider    Answer:   Cassandria Anger [[id number]]  . umeclidinium-vilanterol (ANORO ELLIPTA) 62.5-25 MCG/INH AEPB    Sig: INHALE ONE PUFF BY MOUTH  DAILY    Dispense:  90 each    Refill:  3    Please consider 90 day supplies to promote better adherence    Order Specific Question:   Supervising Provider    Answer:   Cassandria Anger [[id number]]    Follow-up: Return in about 6 months (around 05/07/2017) for with dr. Sharlet Salina.  Wilfred Lacy, NP

## 2016-11-07 NOTE — Patient Instructions (Signed)
Go to basement for blood draw.  Have eye exam report faxed to [place] when completed.  You can take viagra prescription to [facility] in [place] ([address] [place], [place], [place] [address]) or [facility] ([place], [place], [place] [address]).

## 2016-11-08 MED ORDER — UMECLIDINIUM-VILANTEROL 62.5-25 MCG/INH IN AEPB: INHALATION_SPRAY | RESPIRATORY_TRACT | 3 refills | Status: DC

## 2016-11-09 DIAGNOSIS — E119 Type 2 diabetes mellitus without complications: Secondary | ICD-10-CM | POA: Diagnosis not present

## 2016-11-09 DIAGNOSIS — H40013 Open angle with borderline findings, low risk, bilateral: Secondary | ICD-10-CM | POA: Diagnosis not present

## 2016-11-09 DIAGNOSIS — Z961 Presence of intraocular lens: Secondary | ICD-10-CM | POA: Diagnosis not present

## 2016-11-09 LAB — HM DIABETES EYE EXAM

## 2016-11-15 ENCOUNTER — Encounter: Payer: Self-pay | Admitting: Internal Medicine

## 2016-11-15 NOTE — Progress Notes (Signed)
Abstracted and sent to scan  

## 2016-12-24 ENCOUNTER — Encounter: Payer: Medicare HMO | Admitting: Internal Medicine

## 2017-01-11 ENCOUNTER — Encounter: Payer: Self-pay | Admitting: Endocrinology

## 2017-01-11 ENCOUNTER — Ambulatory Visit (INDEPENDENT_AMBULATORY_CARE_PROVIDER_SITE_OTHER): Payer: Medicare HMO | Admitting: Endocrinology

## 2017-01-11 VITALS — BP 152/72 | HR 72 | Wt 187.8 lb

## 2017-01-11 DIAGNOSIS — Z794 Long term (current) use of insulin: Secondary | ICD-10-CM

## 2017-01-11 DIAGNOSIS — E119 Type 2 diabetes mellitus without complications: Secondary | ICD-10-CM | POA: Diagnosis not present

## 2017-01-11 LAB — POCT GLYCOSYLATED HEMOGLOBIN (HGB A1C): HEMOGLOBIN A1C: 7.7

## 2017-01-11 NOTE — Progress Notes (Signed)
Subjective:    Patient ID: [name], male    DOB: [birth year], 66 y.o.   MRN: [id number]  HPI Pt returns for f/u of diabetes mellitus: DM type: Insulin-requiring type 2 Dx'ed: 8588 Complications: PAD Therapy: insulin since 2016.   DKA: never Severe hypoglycemia: never Pancreatitis: never Other: he chooses qd insulin; he is chronically off and on prednisone, for asthma; he changed lantus to NPH, due to pattern of cbg's; ins declines levemir.    Interval history: pt states he feels well in general.  He brings his meter with his cbg's which I have reviewed today.  All are in the low to mid-100's.  There is no trend throughout the day.  Past Medical History:  Diagnosis Date  . Alcoholism ([place])   . Arthritis    "knees, hands" (03/08/2015)  . Asthma   . Chronic bronchitis ([place])   . Chronic diastolic CHF (congestive heart failure) ([place])    notes 03/08/2015  . Cirrhosis ([place])   . COPD (chronic obstructive pulmonary disease) ([place])   . Coronary artery disease   . Hepatitis C   . High cholesterol   . Hypertension   . Pneumonia 03/2015  . Shortness of breath dyspnea   . Type II diabetes mellitus ([place])     Past Surgical History:  Procedure Laterality Date  . CATARACT EXTRACTION Left 02/2014  . 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 Marital status: Divorced    Spouse name: Not on file  . Number of children: 4  . Years of education: Not on file  . Highest education level: Not on file  Social Needs  . Financial resource strain: Not on file  . Food insecurity - worry: Not on file  . Food insecurity - inability: Not on file  . Transportation needs - medical: Not on file  . Transportation needs - non-medical: Not on file  Occupational History  . Occupation: disabled  Tobacco Use  . Smoking status: Former Smoker    Packs/day: 1.50    Years: 46.00    Pack years: 69.00    Types: Cigarettes    Last attempt to quit: 03/19/2013   Years since quitting: 3.8  . Smokeless tobacco: Never Used  Substance and Sexual Activity  . Alcohol use: No    Alcohol/week: 0.0 oz    Comment: "recovering alcoholic; 07/05/7739"  . Drug use: No  . Sexual activity: Not Currently  Other Topics Concern  . Not on file  Social History Narrative  . Not on file    Current Outpatient Medications on File Prior to Visit  Medication Sig Dispense Refill  . albuterol (VENTOLIN HFA) 108 (90 Base) MCG/ACT inhaler Inhale 1-2 puffs into the lungs every 4 (four) hours as needed for wheezing or shortness of breath. 54 each 3  . Alcohol Swabs (B-D SINGLE USE SWABS REGULAR) PADS Use to swab finger 3 times daily. 300 each 0  . aspirin EC 81 MG tablet Take 1 tablet (81 mg total) by mouth daily. 30 tablet 3  . Blood Glucose Monitoring Suppl (TRUE METRIX AIR GLUCOSE METER) w/Device KIT 1 Device by Does not apply route 2 (two) times daily. 1 kit 0  . cholecalciferol (VITAMIN D) 1000 UNITS tablet Take 1 tablet (1,000 Units total) by mouth daily. (Patient taking differently: Take 1,000 Units by mouth 2 (two) times daily. ) 30 tablet 0  . Cinnamon 500 MG capsule Take 500 mg by mouth 2 (  two) times daily.    . famotidine (PEPCID) 40 MG tablet Take 1 tablet (40 mg total) by mouth daily. 28 tablet 0  . guaiFENesin-codeine 100-10 MG/5ML syrup Take 5 mLs by mouth every 6 (six) hours as needed for cough. 120 mL 0  . insulin NPH Human (NOVOLIN N) 100 UNIT/ML injection Inject 0.6 mLs (60 Units total) into the skin every morning. And syringes 1/day 20 mL 11  . Insulin Pen Needle (NOVOFINE) 32G X 6 MM MISC Use to inject insulin 1 time per day. 90 each 2  . Insulin Syringe-Needle U-100 (INSULIN SYRINGE 1CC/31GX5/16") 31G X 5/16" 1 ML MISC Use to inject insulin 1 time per day. 100 each 2  . Multiple Vitamins-Minerals (MULTIVITAMIN WITH MINERALS) tablet Take 1 tablet by mouth daily.    . Omega-3 Fatty Acids (FISH OIL) 1000 MG CAPS Take 2 capsules by mouth daily.    . sildenafil  (VIAGRA) 100 MG tablet Take 0.5-1 tablets (50-100 mg total) by mouth daily as needed for erectile dysfunction. 5 tablet 3  . traZODone (DESYREL) 50 MG tablet TAKE 1 TABLET (50 MG TOTAL) BY MOUTH AT BEDTIME AS NEEDED FOR SLEEP 90 tablet 0  . TRUE METRIX BLOOD GLUCOSE TEST test strip CHECK BLOOD SUGAR 3 TIMES DAILY. 300 each 0  . TRUEPLUS LANCETS 28G MISC CHECK BLOOD SUGAR 3 TIMES DAILY. 300 each 0  . umeclidinium-vilanterol (ANORO ELLIPTA) 62.5-25 MCG/INH AEPB INHALE ONE PUFF BY MOUTH  DAILY 90 each 3   No current facility-administered medications on file prior to visit.     Allergies  Allergen Reactions  . Crestor [Rosuvastatin] Other (See Comments)    INTOLERANCE   . Metformin And Related Other (See Comments)    Stomach ache  . Mobic [Meloxicam] Rash    Family History  Problem Relation Age of Onset  . Stroke Mother   . Diabetes Mother   . Hypertension Mother   . Hyperlipidemia Mother   . Heart disease Father   . Hypertension Father   . Heart attack Father     BP (!) 152/72 (BP Location: Left Arm, Patient Position: Sitting, Cuff Size: Normal)   Pulse 72   Wt 187 lb 12.8 oz (85.2 kg)   SpO2 99%   BMI 32.24 kg/m    Review of Systems He denies hypoglycemia.      Objective:   Physical Exam VITAL SIGNS:  See vs page GENERAL: no distress Pulses: foot pulses are intact bilaterally.   MSK: no deformity of the feet or ankles.  CV: no edema of the legs or ankles Skin:  no ulcer on the feet or ankles.  normal color and temp on the feet and ankles Neuro: sensation is intact to touch on the feet and ankles.     Lab Results  Component Value Date   HGBA1C 7.7 01/11/2017       Assessment & Plan:  Insulin-requiring type 2 DM, with PAD: this is the best control this pt should aim for, given this regimen, which does match insulin to his changing needs throughout the day  Patient Instructions  check your blood sugar twice a day.  vary the time of day when you check, between  before the 3 meals, and at bedtime.  also check if you have symptoms of your blood sugar being too high or too low.  please keep a record of the readings and bring it to your next appointment here (or you can bring the meter itself).  You can write  it on any piece of paper.  please call us sooner if your blood sugar goes below 70, or if you have a lot of readings over 200. Please continue the NPH insulin, 60 units each morning On this type of insulin schedule, you should eat meals on a regular schedule.  If a meal is missed or significantly delayed, your blood sugar could go low.  Please come back for a follow-up appointment 4 months.

## 2017-01-11 NOTE — Patient Instructions (Signed)
check your blood sugar twice a day.  vary the time of day when you check, between before the 3 meals, and at bedtime.  also check if you have symptoms of your blood sugar being too high or too low.  please keep a record of the readings and bring it to your next appointment here (or you can bring the meter itself).  You can write it on any piece of paper.  please call us sooner if your blood sugar goes below 70, or if you have a lot of readings over 200. Please continue the NPH insulin, 60 units each morning On this type of insulin schedule, you should eat meals on a regular schedule.  If a meal is missed or significantly delayed, your blood sugar could go low.  Please come back for a follow-up appointment 4 months.

## 2017-01-18 IMAGING — CR DG CHEST 2V
2 series · 2 of 2 positions shown · non-contrast
Comparison: 11/09/2014

CLINICAL DATA: Cough and shortness of breath for 3 days

EXAM:
CHEST - 2 VIEW

[chest pa]
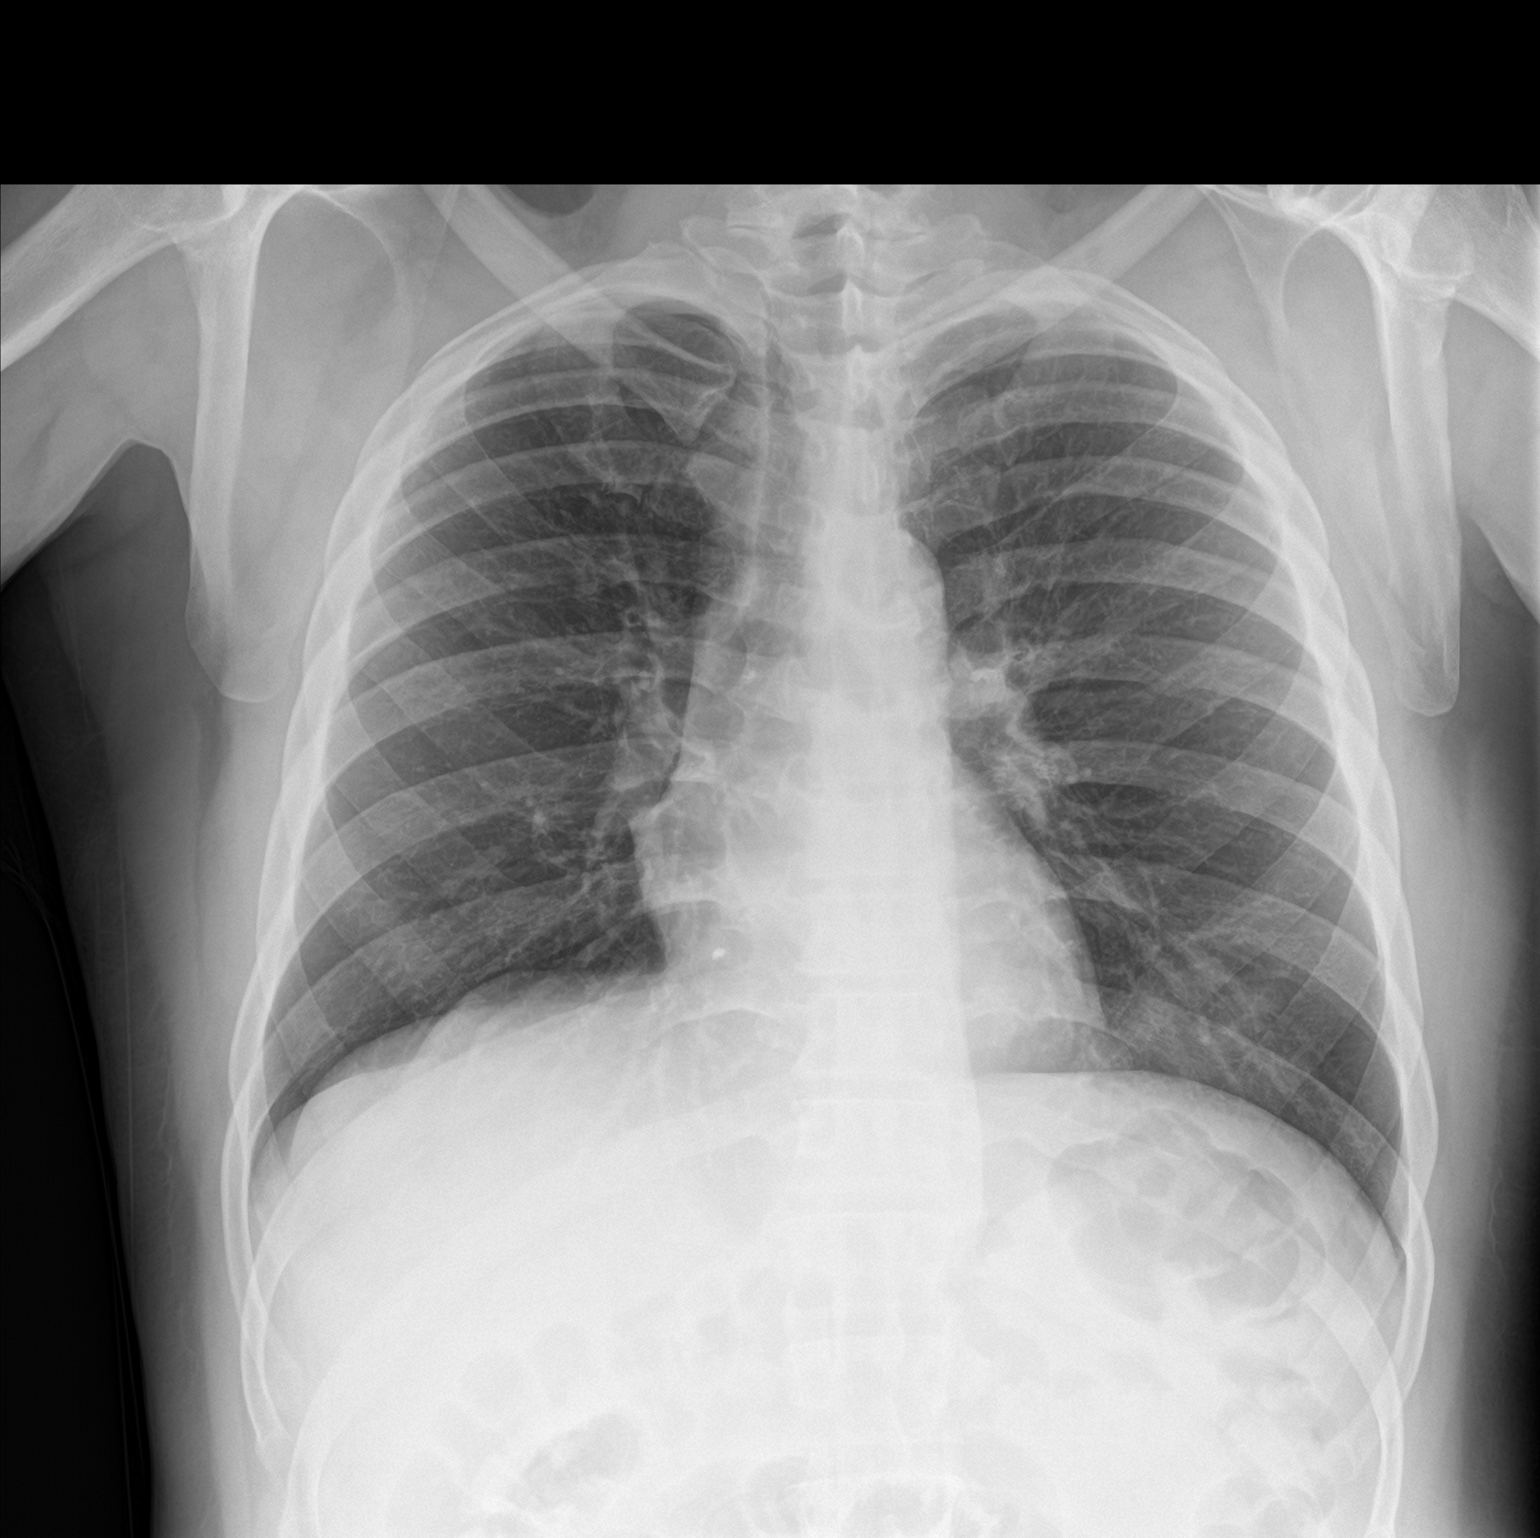

[chest lat]
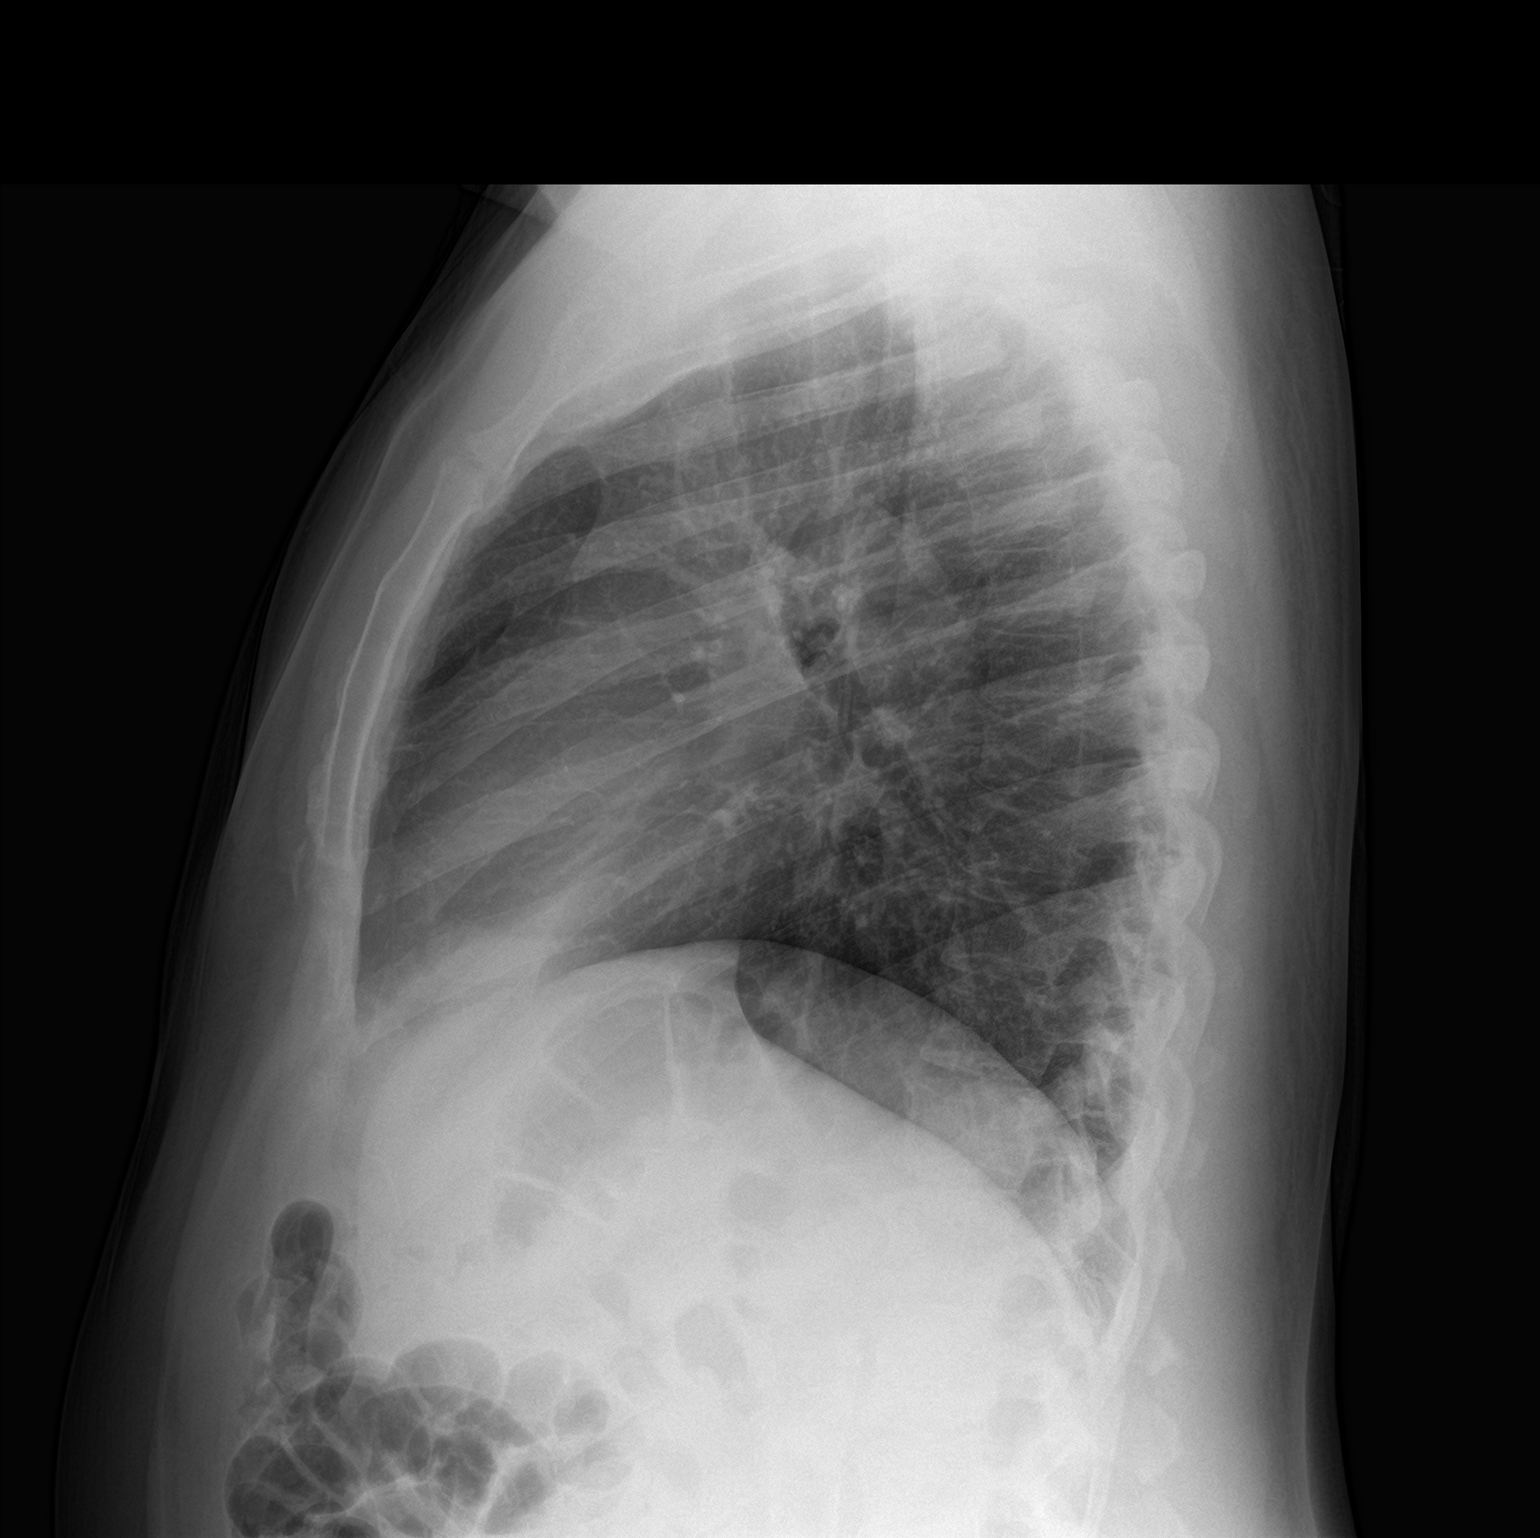

[2 of 2 positions shown; findings below may reference images not displayed]

FINDINGS: The heart size and mediastinal contours are within normal limits.
Both lungs are clear. The visualized skeletal structures are
unremarkable.
IMPRESSION: No active disease.

## 2017-01-29 ENCOUNTER — Other Ambulatory Visit: Payer: Self-pay | Admitting: Internal Medicine

## 2017-01-29 DIAGNOSIS — J4521 Mild intermittent asthma with (acute) exacerbation: Secondary | ICD-10-CM

## 2017-01-30 MED ORDER — FAMOTIDINE 40 MG PO TABS: 40.0000 mg | ORAL_TABLET | Freq: Every day | ORAL | 0 refills | Status: DC

## 2017-01-30 NOTE — Telephone Encounter (Signed)
Reviewed chart pt is up-to-date saw Baldo Ash back in Sept in absence of Dr. Sharlet Salina. Will be f/u w/her in march. Sent    refills to pof.Marland KitchenJohny Chess

## 2017-01-30 NOTE — Telephone Encounter (Signed)
See message below °

## 2017-01-30 NOTE — Telephone Encounter (Signed)
Copied from [place] [id number]. Topic: Quick Communication - See Telephone Encounter >> Jan 30, 2017  8:41 AM Antonieta Iba C wrote: CRM for notification. See Telephone encounter for:  01/30/17.    Self.    Refill request for famotidine    Pharmacy: [facility] [address], [place] - [address]

## 2017-02-12 ENCOUNTER — Other Ambulatory Visit: Payer: Self-pay | Admitting: Internal Medicine

## 2017-04-24 ENCOUNTER — Telehealth: Payer: Self-pay | Admitting: Endocrinology

## 2017-04-24 NOTE — Telephone Encounter (Signed)
Patient has question about his insulin Novolin,  insurance said it was on file. Please advise

## 2017-04-25 ENCOUNTER — Encounter: Payer: Self-pay | Admitting: Internal Medicine

## 2017-04-25 ENCOUNTER — Ambulatory Visit (INDEPENDENT_AMBULATORY_CARE_PROVIDER_SITE_OTHER): Payer: Medicare HMO | Admitting: Internal Medicine

## 2017-04-25 VITALS — BP 160/90 | HR 85 | Temp 98.0°F | Ht 64.0 in | Wt 193.0 lb

## 2017-04-25 DIAGNOSIS — J4530 Mild persistent asthma, uncomplicated: Secondary | ICD-10-CM

## 2017-04-25 DIAGNOSIS — I10 Essential (primary) hypertension: Secondary | ICD-10-CM | POA: Diagnosis not present

## 2017-04-25 DIAGNOSIS — N529 Male erectile dysfunction, unspecified: Secondary | ICD-10-CM | POA: Diagnosis not present

## 2017-04-25 MED ORDER — SILDENAFIL CITRATE 20 MG PO TABS: 100.0000 mg | ORAL_TABLET | Freq: Every day | ORAL | 6 refills | Status: DC | PRN

## 2017-04-25 MED ORDER — HYDROCHLOROTHIAZIDE 25 MG PO TABS: 25.0000 mg | ORAL_TABLET | Freq: Every day | ORAL | 3 refills | Status: DC

## 2017-04-25 NOTE — Patient Instructions (Addendum)
We will send in a medicine for blood pressure called hydrochlorothiazide to take 1 pill daily. Come back in 1-2 months to check the blood pressure.   We have given you the prescription for the viagra (generic name sildenafil) that you can take to the pharmacy which should be cheaper. This is a different dosage so take 5 pills when needed for erections. You can take this to [facility] or [facility] or [facility] for a good discount. If you go on good rx you can print out a coupon to take with you. The quoted price for this is about $15 for about 30 pills (this would be about 6 times to use since you are taking 5 at a time). You can even try taking 4 pills to see if this does as good (this would be 80 mg).   We will get you back in with the urologist to talk to them.

## 2017-04-25 NOTE — Telephone Encounter (Signed)
Went over with pt that each vial should last him 16 days so he should have plenty of insulin to last him the full 30 days. Pt stated that he is trying to refill his insulin to soon, he is being told to wait unitl the 26th. He is aware we have one vial here for him to pick up and that will last him till his appt where he wants to discuss getting off of insulin.

## 2017-04-25 NOTE — Progress Notes (Signed)
   Subjective:    Patient ID: [name], male    DOB: [birth year], 67 y.o.   MRN: [id number]  HPI The patient is a 67 YO man coming in for several concerns including hypertension (taking nothing currently, been on losartan in the past, denies headaches or chest pains, knows his blood pressure has been high at home) and ED (has success with viagra but cannot afford it, wants to know if there are cheaper options, wants to see urology again, this is limiting him with dating as he is ashamed about this) and asthma (still taking anoro, no flare since last time, denies SOB or wheezing now).   Review of Systems  Constitutional: Negative.   HENT: Negative.   Eyes: Negative.   Respiratory: Negative for cough, chest tightness and shortness of breath.   Cardiovascular: Negative for chest pain, palpitations and leg swelling.  Gastrointestinal: Negative for abdominal distention, abdominal pain, constipation, diarrhea, nausea and vomiting.  Genitourinary:       Ed  Musculoskeletal: Negative.   Skin: Negative.   Neurological: Negative.   Psychiatric/Behavioral: Negative.       Objective:   Physical Exam  Constitutional: He is oriented to person, place, and time. He appears well-developed and well-nourished.  HENT:  Head: Normocephalic and atraumatic.  Eyes: EOM are normal.  Neck: Normal range of motion.  Cardiovascular: Normal rate and regular rhythm.  Pulmonary/Chest: Effort normal and breath sounds normal. No respiratory distress. He has no wheezes. He has no rales.  Abdominal: Soft. Bowel sounds are normal. He exhibits no distension. There is no tenderness. There is no rebound.  Musculoskeletal: He exhibits no edema.  Neurological: He is alert and oriented to person, place, and time. Coordination normal.  Skin: Skin is warm and dry.  Psychiatric: He has a normal mood and affect.   Vitals:   04/25/17 1557  BP: (!) 160/90  Pulse: 85  Temp: 98 F (36.7 C)  TempSrc: Oral  SpO2: 98%    Weight: 193 lb (87.5 kg)  Height: 5\' 4"  (1.626 m)      Assessment & Plan:

## 2017-04-26 NOTE — Assessment & Plan Note (Signed)
Without exacerbation today, mild intermittent. Continue anoro and albuterol as needed.

## 2017-04-26 NOTE — Assessment & Plan Note (Signed)
Rx for sildenafil and referral to urology. Informed him that viagra is expensive and likely he will not be able to get this cheap.

## 2017-04-26 NOTE — Assessment & Plan Note (Signed)
BP above goal and given the ED and CHF diastolic and DM needs tight control. Rx for hctz and needs to return in 1 month for BP check and labs.

## 2017-05-07 ENCOUNTER — Ambulatory Visit: Payer: Medicare HMO | Admitting: Internal Medicine

## 2017-05-07 ENCOUNTER — Other Ambulatory Visit: Payer: Self-pay | Admitting: Internal Medicine

## 2017-05-10 ENCOUNTER — Ambulatory Visit: Payer: Medicare HMO | Admitting: Endocrinology

## 2017-05-10 ENCOUNTER — Encounter: Payer: Self-pay | Admitting: Endocrinology

## 2017-05-10 ENCOUNTER — Ambulatory Visit (INDEPENDENT_AMBULATORY_CARE_PROVIDER_SITE_OTHER): Payer: Medicare HMO | Admitting: Endocrinology

## 2017-05-10 VITALS — BP 160/96 | HR 89 | Wt 191.0 lb

## 2017-05-10 DIAGNOSIS — E119 Type 2 diabetes mellitus without complications: Secondary | ICD-10-CM

## 2017-05-10 DIAGNOSIS — Z794 Long term (current) use of insulin: Secondary | ICD-10-CM

## 2017-05-10 MED ORDER — GLUCOSE BLOOD VI STRP: 1.0000 | ORAL_STRIP | Freq: Four times a day (QID) | 0 refills | Status: DC

## 2017-05-10 MED ORDER — INSULIN NPH (HUMAN) (ISOPHANE) 100 UNIT/ML ~~LOC~~ SUSP: 70.0000 [IU] | SUBCUTANEOUS | 11 refills | Status: DC

## 2017-05-10 NOTE — Patient Instructions (Addendum)
check your blood sugar 4 times a day: before the 3 meals, and at bedtime.  also check if you have symptoms of your blood sugar being too high or too low.  please keep a record of the readings and bring it to your next appointment here (or you can bring the meter itself).  You can write it on any piece of paper.  please call us sooner if your blood sugar goes below 70, or if you have a lot of readings over 200. Please increase the NPH insulin to 70 units each morning On this type of insulin schedule, you should eat meals on a regular schedule.  If a meal is missed or significantly delayed, your blood sugar could go low.  Please come back for a follow-up appointment 2 months.

## 2017-05-10 NOTE — Progress Notes (Signed)
Subjective:    Patient ID: [name], male    DOB: 12-Aug-1950, 67 y.o.   MRN: [id number]  HPI Pt returns for f/u of diabetes mellitus: DM type: Insulin-requiring type 2.  Dx'ed: 3790 Complications: PAD Therapy: insulin since 2016.   DKA: never Severe hypoglycemia: never.  Pancreatitis: never Other: he chooses qd insulin; he is chronically off and on prednisone, for asthma; he changed lantus to NPH, due to pattern of cbg's; ins declines levemir.    Interval history: pt states he feels well in general.  He brings his meter with his cbg's which I have reviewed today.  It varies from 106-200's.  There is no trend throughout the day. Main symptom is diffuse arthralgias (no recent steroids). .  Past Medical History:  Diagnosis Date  . Alcoholism ([place])   . Arthritis    "knees, hands" (03/08/2015)  . Asthma   . Chronic bronchitis ([place])   . Chronic diastolic CHF (congestive heart failure) ([place])    notes 03/08/2015  . Cirrhosis ([place])   . COPD (chronic obstructive pulmonary disease) ([place])   . Coronary artery disease   . Hepatitis C   . High cholesterol   . Hypertension   . Pneumonia 03/2015  . Shortness of breath dyspnea   . Type II diabetes mellitus ([place])     Past Surgical History:  Procedure Laterality Date  . CATARACT EXTRACTION Left 02/2014  . ESOPHAGOGASTRODUODENOSCOPY  02/09/2011   Procedure: ESOPHAGOGASTRODUODENOSCOPY (EGD);  Surgeon: Missy Sabins, MD;  Location: [facility] ENDOSCOPY;  Service: Endoscopy;  Laterality: N/A;  . 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 Marital status: Divorced    Spouse name: Not on file  . Number of children: 4  . Years of education: Not on file  . Highest education level: Not on file  Social Needs  . Financial resource strain: Not on file  . Food insecurity - worry: Not on file  . Food insecurity - inability: Not on file  . Transportation needs - medical: Not on file  . Transportation needs -  non-medical: Not on file  Occupational History  . Occupation: disabled  Tobacco Use  . Smoking status: Former Smoker    Packs/day: 1.50    Years: 46.00    Pack years: 69.00    Types: Cigarettes    Last attempt to quit: 03/19/2013    Years since quitting: 4.1  . Smokeless tobacco: Never Used  Substance and Sexual Activity  . Alcohol use: No    Alcohol/week: 0.0 oz    Comment: "recovering alcoholic; 2/40/9735"  . Drug use: No  . Sexual activity: Not Currently  Other Topics Concern  . Not on file  Social History Narrative  . Not on file    Current Outpatient Medications on File Prior to Visit  Medication Sig Dispense Refill  . albuterol (VENTOLIN HFA) 108 (90 Base) MCG/ACT inhaler Inhale 1-2 puffs into the lungs every 4 (four) hours as needed for wheezing or shortness of breath. 54 each 3  . Alcohol Swabs (B-D SINGLE USE SWABS REGULAR) PADS Use to swab finger 3 times daily. 300 each 0  . ANORO ELLIPTA 62.5-25 MCG/INH AEPB INHALE ONE PUFF BY MOUTH DAILY 180 each 0  . aspirin EC 81 MG tablet Take 1 tablet (81 mg total) by mouth daily. 30 tablet 3  . Blood Glucose Monitoring Suppl (TRUE METRIX AIR GLUCOSE METER) w/Device KIT 1 Device by Does not apply route  2 (two) times daily. 1 kit 0  . cholecalciferol (VITAMIN D) 1000 UNITS tablet Take 1 tablet (1,000 Units total) by mouth daily. (Patient taking differently: Take 1,000 Units by mouth 2 (two) times daily. ) 30 tablet 0  . Cinnamon 500 MG capsule Take 500 mg by mouth 2 (two) times daily.    . famotidine (PEPCID) 40 MG tablet TAKE 1 TABLET BY MOUTH ONCE DAILY. DUE FOR FOLLOW-UP APPT. IN MARCH, 2019 MUST SEE PROVIDER FOR FUTURE REFILLS. 90 tablet 0  . guaiFENesin-codeine 100-10 MG/5ML syrup Take 5 mLs by mouth every 6 (six) hours as needed for cough. 120 mL 0  . hydrochlorothiazide (HYDRODIURIL) 25 MG tablet Take 1 tablet (25 mg total) by mouth daily. 90 tablet 3  . Insulin Pen Needle (NOVOFINE) 32G X 6 MM MISC Use to inject insulin 1  time per day. 90 each 2  . Insulin Syringe-Needle U-100 (INSULIN SYRINGE 1CC/31GX5/16") 31G X 5/16" 1 ML MISC Use to inject insulin 1 time per day. 100 each 2  . Multiple Vitamins-Minerals (MULTIVITAMIN WITH MINERALS) tablet Take 1 tablet by mouth daily.    . Omega-3 Fatty Acids (FISH OIL) 1000 MG CAPS Take 2 capsules by mouth daily.    . sildenafil (REVATIO) 20 MG tablet Take 5 tablets (100 mg total) by mouth daily as needed. 60 tablet 6  . sildenafil (VIAGRA) 100 MG tablet Take 0.5-1 tablets (50-100 mg total) by mouth daily as needed for erectile dysfunction. 5 tablet 3  . traZODone (DESYREL) 50 MG tablet TAKE 1 TABLET AT BEDTIME AS NEEDED FOR SLEEP 90 tablet 0  . TRUEPLUS LANCETS 28G MISC CHECK BLOOD SUGAR 3 TIMES DAILY. 300 each 0   No current facility-administered medications on file prior to visit.     Allergies  Allergen Reactions  . Crestor [Rosuvastatin] Other (See Comments)    INTOLERANCE   . Metformin And Related Other (See Comments)    Stomach ache  . Mobic [Meloxicam] Rash    Family History  Problem Relation Age of Onset  . Stroke Mother   . Diabetes Mother   . Hypertension Mother   . Hyperlipidemia Mother   . Heart disease Father   . Hypertension Father   . Heart attack Father     BP (!) 160/96 (BP Location: Left Arm, Patient Position: Sitting, Cuff Size: Normal)   Pulse 89   Wt 191 lb (86.6 kg)   SpO2 99%   BMI 32.79 kg/m    Review of Systems He denies hypoglycemia.     Objective:   Physical Exam VITAL SIGNS:  See vs page GENERAL: no distress Pulses: foot pulses are intact bilaterally.   MSK: no deformity of the feet or ankles.  CV: no edema of the legs or ankles Skin:  no ulcer on the feet or ankles.  normal color and temp on the feet and ankles Neuro: sensation is intact to touch on the feet and ankles.    Lab Results  Component Value Date   HGBA1C 7.7 01/11/2017      Assessment & Plan:  Insulin-requiring type 2 DM, with PAD: he needs  increased rx HTN: recheck with PCP soon   Patient Instructions  check your blood sugar 4 times a day: before the 3 meals, and at bedtime.  also check if you have symptoms of your blood sugar being too high or too low.  please keep a record of the readings and bring it to your next appointment here (or you can bring  the meter itself).  You can write it on any piece of paper.  please call us sooner if your blood sugar goes below 70, or if you have a lot of readings over 200. Please increase the NPH insulin to 70 units each morning On this type of insulin schedule, you should eat meals on a regular schedule.  If a meal is missed or significantly delayed, your blood sugar could go low.  Please come back for a follow-up appointment 2 months.

## 2017-05-28 ENCOUNTER — Other Ambulatory Visit: Payer: Self-pay | Admitting: Internal Medicine

## 2017-06-02 ENCOUNTER — Other Ambulatory Visit: Payer: Self-pay | Admitting: Endocrinology

## 2017-06-07 ENCOUNTER — Ambulatory Visit: Payer: Medicare HMO | Admitting: Internal Medicine

## 2017-06-10 ENCOUNTER — Encounter: Payer: Self-pay | Admitting: Internal Medicine

## 2017-06-10 ENCOUNTER — Ambulatory Visit (INDEPENDENT_AMBULATORY_CARE_PROVIDER_SITE_OTHER): Payer: Medicare HMO | Admitting: Internal Medicine

## 2017-06-10 VITALS — BP 130/72 | HR 78 | Temp 98.3°F | Ht 64.0 in | Wt 195.0 lb

## 2017-06-10 DIAGNOSIS — I10 Essential (primary) hypertension: Secondary | ICD-10-CM

## 2017-06-10 NOTE — Progress Notes (Signed)
   Subjective:    Patient ID: [name], male    DOB: [birth year], 67 y.o.   MRN: [id number]  HPI The patient is a 67 YO man coming in for BP follow up. Just started on hctz 25 mg daily. Denies side effects from medication. He has not noticed that it has helped. He is still having high BP at home. Mostly in the 160-170s at home. Unchanged with BP medication. He is not confident in his meter. Denies headaches, migraines, chest pains, stomach problems.   Review of Systems  Constitutional: Negative.   HENT: Negative.   Eyes: Negative.   Respiratory: Negative for cough, chest tightness and shortness of breath.   Cardiovascular: Negative for chest pain, palpitations and leg swelling.  Gastrointestinal: Negative for abdominal distention, abdominal pain, constipation, diarrhea, nausea and vomiting.  Musculoskeletal: Negative.   Skin: Negative.   Neurological: Negative.   Psychiatric/Behavioral: Negative.       Objective:   Physical Exam  Constitutional: He is oriented to person, place, and time. He appears well-developed and well-nourished.  HENT:  Head: Normocephalic and atraumatic.  Eyes: EOM are normal.  Neck: Normal range of motion.  Cardiovascular: Normal rate and regular rhythm.  Pulmonary/Chest: Effort normal and breath sounds normal. No respiratory distress. He has no wheezes. He has no rales.  Abdominal: Soft.  Musculoskeletal: He exhibits no edema.  Neurological: He is alert and oriented to person, place, and time. Coordination normal.  Skin: Skin is warm and dry.   Vitals:   06/10/17 1009  BP: 130/72  Pulse: 78  Temp: 98.3 F (36.8 C)  TempSrc: Oral  SpO2: 97%  Weight: 195 lb (88.5 kg)  Height: 5\' 4"  (1.626 m)      Assessment & Plan:

## 2017-06-10 NOTE — Assessment & Plan Note (Signed)
Taking hctz 25 mg daily now and BP improved for [place]. Will have him bring in meter for accuracy to our office. If accurate needs increased therapy with home readings average 160-170. If not accurate will maintain same therapy.

## 2017-06-10 NOTE — Patient Instructions (Signed)
We will have you bring the machine in to get it checked.

## 2017-06-17 ENCOUNTER — Encounter: Payer: Self-pay | Admitting: Internal Medicine

## 2017-06-17 ENCOUNTER — Ambulatory Visit (INDEPENDENT_AMBULATORY_CARE_PROVIDER_SITE_OTHER): Payer: Medicare HMO | Admitting: Internal Medicine

## 2017-06-17 ENCOUNTER — Other Ambulatory Visit (INDEPENDENT_AMBULATORY_CARE_PROVIDER_SITE_OTHER): Payer: Medicare HMO

## 2017-06-17 VITALS — BP 140/78 | HR 85 | Temp 98.2°F | Ht 64.0 in | Wt 193.0 lb

## 2017-06-17 DIAGNOSIS — Z1211 Encounter for screening for malignant neoplasm of colon: Secondary | ICD-10-CM

## 2017-06-17 DIAGNOSIS — E119 Type 2 diabetes mellitus without complications: Secondary | ICD-10-CM

## 2017-06-17 DIAGNOSIS — E785 Hyperlipidemia, unspecified: Secondary | ICD-10-CM

## 2017-06-17 DIAGNOSIS — Z794 Long term (current) use of insulin: Secondary | ICD-10-CM

## 2017-06-17 DIAGNOSIS — Z0000 Encounter for general adult medical examination without abnormal findings: Secondary | ICD-10-CM | POA: Diagnosis not present

## 2017-06-17 DIAGNOSIS — E1169 Type 2 diabetes mellitus with other specified complication: Secondary | ICD-10-CM | POA: Diagnosis not present

## 2017-06-17 DIAGNOSIS — B182 Chronic viral hepatitis C: Secondary | ICD-10-CM | POA: Diagnosis not present

## 2017-06-17 LAB — CBC
HCT: 42.2 % (ref 39.0–52.0)
HEMOGLOBIN: 14 g/dL (ref 13.0–17.0)
MCHC: 33.2 g/dL (ref 30.0–36.0)
MCV: 83.3 fl (ref 78.0–100.0)
PLATELETS: 292 10*3/uL (ref 150.0–400.0)
RBC: 5.06 Mil/uL (ref 4.22–5.81)
RDW: 12.9 % (ref 11.5–15.5)
WBC: 6.7 10*3/uL (ref 4.0–10.5)

## 2017-06-17 LAB — COMPREHENSIVE METABOLIC PANEL
ALT: 30 U/L (ref 0–53)
AST: 22 U/L (ref 0–37)
Albumin: 4.4 g/dL (ref 3.5–5.2)
Alkaline Phosphatase: 55 U/L (ref 39–117)
BILIRUBIN TOTAL: 0.6 mg/dL (ref 0.2–1.2)
BUN: 16 mg/dL (ref 6–23)
CALCIUM: 9.3 mg/dL (ref 8.4–10.5)
CHLORIDE: 97 meq/L (ref 96–112)
CO2: 27 meq/L (ref 19–32)
Creatinine, Ser: 0.9 mg/dL (ref 0.40–1.50)
GFR: 108.64 mL/min (ref >60.00)
GLUCOSE: 201 mg/dL — AB (ref 70–99)
Potassium: 3.5 mEq/L (ref 3.5–5.1)
Sodium: 131 mEq/L — ABNORMAL LOW (ref 135–145)
Total Protein: 7.7 g/dL (ref 6.0–8.3)

## 2017-06-17 LAB — LIPID PANEL
CHOL/HDL RATIO: 5
Cholesterol: 218 mg/dL — ABNORMAL HIGH (ref 0–200)
HDL: 41.5 mg/dL (ref >39.00)
LDL CALC: 154 mg/dL — AB (ref 0–99)
NONHDL: 176.14
TRIGLYCERIDES: 109 mg/dL (ref 0.0–149.0)
VLDL: 21.8 mg/dL (ref 0.0–40.0)

## 2017-06-17 LAB — HEMOGLOBIN A1C: HEMOGLOBIN A1C: 9.4 % — AB (ref 4.6–6.5)

## 2017-06-17 NOTE — Patient Instructions (Addendum)
You can order on [place]. The really good ones are n95 masks but any flu mask would be okay as well for the mowing.   We are checking the labs today and call you back about the results.    Health Maintenance, Male A healthy lifestyle and preventive care is important for your health and wellness. Ask your health care provider about what schedule of regular examinations is right for you. What should I know about weight and diet? Eat a Healthy Diet  Eat plenty of vegetables, fruits, whole grains, low-fat dairy products, and lean protein.  Do not eat a lot of foods high in solid fats, added sugars, or salt.  Maintain a Healthy Weight Regular exercise can help you achieve or maintain a healthy weight. You should:  Do at least 150 minutes of exercise each week. The exercise should increase your heart rate and make you sweat (moderate-intensity exercise).  Do strength-training exercises at least twice a week.  Watch Your Levels of Cholesterol and Blood Lipids  Have your blood tested for lipids and cholesterol every 5 years starting at 66 years of age. If you are at high risk for heart disease, you should start having your blood tested when you are 66 years old. You may need to have your cholesterol levels checked more often if: ? Your lipid or cholesterol levels are high. ? You are older than 65 years of age. ? You are at high risk for heart disease.  What should I know about cancer screening? Many types of cancers can be detected early and may often be prevented. Lung Cancer  You should be screened every year for lung cancer if: ? You are a current smoker who has smoked for at least 30 years. ? You are a former smoker who has quit within the past 15 years.  Talk to your health care provider about your screening options, when you should start screening, and how often you should be screened.  Colorectal Cancer  Routine colorectal cancer screening usually begins at 66 years of age and  should be repeated every 5-10 years until you are 66 years old. You may need to be screened more often if early forms of precancerous polyps or small growths are found. Your health care provider may recommend screening at an earlier age if you have risk factors for colon cancer.  Your health care provider may recommend using home test kits to check for hidden blood in the stool.  A small camera at the end of a tube can be used to examine your colon (sigmoidoscopy or colonoscopy). This checks for the earliest forms of colorectal cancer.  Prostate and Testicular Cancer  Depending on your age and overall health, your health care provider may do certain tests to screen for prostate and testicular cancer.  Talk to your health care provider about any symptoms or concerns you have about testicular or prostate cancer.  Skin Cancer  Check your skin from head to toe regularly.  Tell your health care provider about any new moles or changes in moles, especially if: ? There is a change in a mole's size, shape, or color. ? You have a mole that is larger than a pencil eraser.  Always use sunscreen. Apply sunscreen liberally and repeat throughout the day.  Protect yourself by wearing long sleeves, pants, a wide-brimmed hat, and sunglasses when outside.  What should I know about heart disease, diabetes, and high blood pressure?  If you are 56-52 years of age, have  your blood pressure checked every 3-5 years. If you are 30 years of age or older, have your blood pressure checked every year. You should have your blood pressure measured twice-once when you are at a hospital or clinic, and once when you are not at a hospital or clinic. Record the average of the two measurements. To check your blood pressure when you are not at a hospital or clinic, you can use: ? An automated blood pressure machine at a pharmacy. ? A home blood pressure monitor.  Talk to your health care provider about your target blood  pressure.  If you are between 84-30 years old, ask your health care provider if you should take aspirin to prevent heart disease.  Have regular diabetes screenings by checking your fasting blood sugar level. ? If you are at a normal weight and have a low risk for diabetes, have this test once every three years after the age of 40. ? If you are overweight and have a high risk for diabetes, consider being tested at a younger age or more often.  A one-time screening for abdominal aortic aneurysm (AAA) by ultrasound is recommended for men aged 67-75 years who are current or former smokers. What should I know about preventing infection? Hepatitis B If you have a higher risk for hepatitis B, you should be screened for this virus. Talk with your health care provider to find out if you are at risk for hepatitis B infection. Hepatitis C Blood testing is recommended for:  Everyone born from [birth year] through 1964.  Anyone with known risk factors for hepatitis C.  Sexually Transmitted Diseases (STDs)  You should be screened each year for STDs including gonorrhea and chlamydia if: ? You are sexually active and are younger than 66 years of age. ? You are older than 66 years of age and your health care provider tells you that you are at risk for this type of infection. ? Your sexual activity has changed since you were last screened and you are at an increased risk for chlamydia or gonorrhea. Ask your health care provider if you are at risk.  Talk with your health care provider about whether you are at high risk of being infected with HIV. Your health care provider may recommend a prescription medicine to help prevent HIV infection.  What else can I do?  Schedule regular health, dental, and eye exams.  Stay current with your vaccines (immunizations).  Do not use any tobacco products, such as cigarettes, chewing tobacco, and e-cigarettes. If you need help quitting, ask your health care  provider.  Limit alcohol intake to no more than 2 drinks per day. One drink equals 12 ounces of beer, 5 ounces of wine, or 1 ounces of hard liquor.  Do not use street drugs.  Do not share needles.  Ask your health care provider for help if you need support or information about quitting drugs.  Tell your health care provider if you often feel depressed.  Tell your health care provider if you have ever been abused or do not feel safe at home. This information is not intended to replace advice given to you by your health care provider. Make sure you discuss any questions you have with your health care provider. Document Released: 08/18/2007 Document Revised: 10/19/2015 Document Reviewed: 11/23/2014 Elsevier Interactive Patient Education  [employer].

## 2017-06-17 NOTE — Progress Notes (Signed)
   Subjective:    Patient ID: [name], male    DOB: November 19, 1950, 67 y.o.   MRN: [id number]  HPI The patient is a 67 YO man coming in for physical. No new concerns.   PMH, [facility], social history reviewed and updated.   Review of Systems  Constitutional: Negative.   HENT: Negative.   Eyes: Negative.   Respiratory: Negative for cough, chest tightness and shortness of breath.   Cardiovascular: Negative for chest pain, palpitations and leg swelling.  Gastrointestinal: Negative for abdominal distention, abdominal pain, constipation, diarrhea, nausea and vomiting.  Musculoskeletal: Negative.   Skin: Negative.   Neurological: Negative.   Psychiatric/Behavioral: Negative.       Objective:   Physical Exam  Constitutional: He is oriented to person, place, and time. He appears well-developed and well-nourished.  HENT:  Head: Normocephalic and atraumatic.  Eyes: EOM are normal.  Neck: Normal range of motion.  Cardiovascular: Normal rate and regular rhythm.  Pulmonary/Chest: Effort normal and breath sounds normal. No respiratory distress. He has no wheezes. He has no rales.  Abdominal: Soft. Bowel sounds are normal. He exhibits no distension. There is no tenderness. There is no rebound.  Musculoskeletal: He exhibits no edema.  Neurological: He is alert and oriented to person, place, and time. Coordination normal.  Skin: Skin is warm and dry.  Psychiatric: He has a normal mood and affect.   Vitals:   06/17/17 1312  BP: 140/78  Pulse: 85  Temp: 98.2 F (36.8 C)  TempSrc: Oral  SpO2: 98%  Weight: 193 lb (87.5 kg)  Height: 5\' 4"  (1.626 m)      Assessment & Plan:

## 2017-06-18 NOTE — Assessment & Plan Note (Signed)
Ordered cologuard for colon cancer screening, has been doing fit testing through insurance yearly and last colonoscopy long time ago. Flu and pneumonia up to date. Ordered HgA1c and urine microalbumin testing. Reminded about yearly flu shot. Counseled about sun safety and mole surveillance. Given 10 year screening recommendations.

## 2017-06-18 NOTE — Assessment & Plan Note (Signed)
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

## 2017-06-18 NOTE — Assessment & Plan Note (Signed)
Cannot tolerate statins and has failed several. Checking lipid panel and adjust as needed.

## 2017-06-18 NOTE — Assessment & Plan Note (Signed)
Ordered HgA1c and urine microalbumin but will continue to see endocrinology for management and they are working on bringing sugars down.

## 2017-06-24 ENCOUNTER — Telehealth: Payer: Self-pay | Admitting: Internal Medicine

## 2017-06-24 NOTE — Telephone Encounter (Signed)
Left message for patient to call back- see labs in chart (not in basket)

## 2017-06-24 NOTE — Telephone Encounter (Signed)
Taken care of through result notes

## 2017-06-24 NOTE — Telephone Encounter (Signed)
Copied from [place] [id number]. Topic: Quick Communication - See Telephone Encounter >> Jun 24, 2017  8:45 AM Bea Graff, NT wrote: CRM for notification. See Telephone encounter for: 06/24/17. Pt calling back to get lab results. Please advise.

## 2017-06-24 NOTE — Telephone Encounter (Signed)
Patient returned call, given lab results per note Dr. Sharlet Salina on 06/18/17, patient verbalized understanding and says he will take the injection for the cholesterol. I advised this would be sent to Dr. Sharlet Salina and someone will contact him with her recommendation.

## 2017-06-25 ENCOUNTER — Other Ambulatory Visit: Payer: Self-pay | Admitting: Internal Medicine

## 2017-06-25 DIAGNOSIS — Z1212 Encounter for screening for malignant neoplasm of rectum: Secondary | ICD-10-CM | POA: Diagnosis not present

## 2017-06-25 DIAGNOSIS — Z1211 Encounter for screening for malignant neoplasm of colon: Secondary | ICD-10-CM | POA: Diagnosis not present

## 2017-06-25 MED ORDER — ALIROCUMAB 75 MG/ML ~~LOC~~ SOPN: 75.0000 mg | PEN_INJECTOR | SUBCUTANEOUS | 3 refills | Status: DC

## 2017-06-26 ENCOUNTER — Telehealth: Payer: Self-pay

## 2017-06-26 NOTE — Telephone Encounter (Signed)
PA started on CoverMyMeds KEY: [id number]

## 2017-06-27 NOTE — Telephone Encounter (Signed)
Pa approved till 12/27/2017

## 2017-06-28 ENCOUNTER — Other Ambulatory Visit: Payer: Self-pay | Admitting: Internal Medicine

## 2017-06-28 DIAGNOSIS — J4521 Mild intermittent asthma with (acute) exacerbation: Secondary | ICD-10-CM

## 2017-07-05 LAB — COLOGUARD: COLOGUARD: POSITIVE

## 2017-07-11 ENCOUNTER — Encounter: Payer: Self-pay | Admitting: Internal Medicine

## 2017-07-11 ENCOUNTER — Other Ambulatory Visit: Payer: Self-pay | Admitting: Internal Medicine

## 2017-07-11 ENCOUNTER — Ambulatory Visit: Payer: Medicare HMO | Admitting: Endocrinology

## 2017-07-11 DIAGNOSIS — R195 Other fecal abnormalities: Secondary | ICD-10-CM

## 2017-07-11 NOTE — Progress Notes (Signed)
Abstracted and sent to scan Patient informed of results and that the referral to GI has been placed for the colonoscopy. Patient stated understanding

## 2017-08-07 ENCOUNTER — Encounter: Payer: Self-pay | Admitting: Internal Medicine

## 2017-08-09 ENCOUNTER — Encounter: Payer: Self-pay | Admitting: Endocrinology

## 2017-08-09 ENCOUNTER — Ambulatory Visit: Payer: Medicare HMO | Admitting: Endocrinology

## 2017-08-09 VITALS — BP 162/90 | HR 68 | Wt 188.4 lb

## 2017-08-09 DIAGNOSIS — E119 Type 2 diabetes mellitus without complications: Secondary | ICD-10-CM

## 2017-08-09 DIAGNOSIS — Z794 Long term (current) use of insulin: Secondary | ICD-10-CM

## 2017-08-09 LAB — POCT GLYCOSYLATED HEMOGLOBIN (HGB A1C): Hemoglobin A1C: 9.6 % — AB (ref 4.0–5.6)

## 2017-08-09 MED ORDER — INSULIN NPH (HUMAN) (ISOPHANE) 100 UNIT/ML ~~LOC~~ SUSP: 70.0000 [IU] | SUBCUTANEOUS | 11 refills | Status: DC

## 2017-08-09 MED ORDER — GLUCOSE BLOOD VI STRP: 1.0000 | ORAL_STRIP | Freq: Two times a day (BID) | 12 refills | Status: DC

## 2017-08-09 NOTE — Patient Instructions (Addendum)
Your blood pressure is high today.  Please see your primary care provider soon, to have it rechecked check your blood sugar 4 times a day: before the 3 meals, and at bedtime.  also check if you have symptoms of your blood sugar being too high or too low.  please keep a record of the readings and bring it to your next appointment here (or you can bring the meter itself).  You can write it on any piece of paper.  please call us sooner if your blood sugar goes below 70, or if you have a lot of readings over 200. Please increase the NPH insulin to 70 units each morning.  However, if you are going to be active, take just 45 units.   On this type of insulin schedule, you should eat meals on a regular schedule.  If a meal is missed or significantly delayed, your blood sugar could go low.   Please come back for a follow-up appointment 2 months.

## 2017-08-09 NOTE — Progress Notes (Signed)
Subjective:    Patient ID: [name], male    DOB: Apr 09, 1950, 67 y.o.   MRN: [id number]  HPI Pt returns for f/u of diabetes mellitus: DM type: Insulin-requiring type 2.  Dx'ed: 3220 Complications: PAD Therapy: insulin since 2016.   DKA: never Severe hypoglycemia: never.  Pancreatitis: never Other: he chooses qd insulin; he is chronically off and on prednisone, for asthma; he changed lantus to NPH, due to pattern of cbg's; ins declines levemir.    Interval history: He takes just 45 units qam.  He brings a record of his cbg's which I have reviewed today.  It varies from 94-200's.  It is in general lowest in the afternoon, with exercise.  No recent steroids.  He often misses the insulin altogether.  He describes numerous personal problems driving the difficulty he is having with his DM.  However, he says he can afford insulin.   Past Medical History:  Diagnosis Date  . Alcoholism ([place])   . Arthritis    "knees, hands" (03/08/2015)  . Asthma   . Chronic bronchitis ([place])   . Chronic diastolic CHF (congestive heart failure) ([place])    notes 03/08/2015  . Cirrhosis ([place])   . COPD (chronic obstructive pulmonary disease) ([place])   . Coronary artery disease   . Hepatitis C   . High cholesterol   . Hypertension   . Pneumonia 03/2015  . Shortness of breath dyspnea   . Type II diabetes mellitus ([place])     Past Surgical History:  Procedure Laterality Date  . CATARACT EXTRACTION Left 02/2014  . ESOPHAGOGASTRODUODENOSCOPY  02/09/2011   Procedure: ESOPHAGOGASTRODUODENOSCOPY (EGD);  Surgeon: Missy Sabins, MD;  Location: [facility] ENDOSCOPY;  Service: Endoscopy;  Laterality: N/A;  . 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 Marital status: Divorced    Spouse name: Not on file  . Number of children: 4  . Years of education: Not on file  . Highest education level: Not on file  Occupational History  . Occupation: disabled  Social Needs  . Financial  resource strain: Not on file  . Food insecurity:    Worry: Not on file    Inability: Not on file  . Transportation needs:    Medical: Not on file    Non-medical: Not on file  Tobacco Use  . Smoking status: Former Smoker    Packs/day: 1.50    Years: 46.00    Pack years: 69.00    Types: Cigarettes    Last attempt to quit: 03/19/2013    Years since quitting: 4.4  . Smokeless tobacco: Never Used  Substance and Sexual Activity  . Alcohol use: No    Alcohol/week: 0.0 oz    Comment: "recovering alcoholic; 2/54/2706"  . Drug use: No  . Sexual activity: Not Currently  Lifestyle  . Physical activity:    Days per week: Not on file    Minutes per session: Not on file  . Stress: Not on file  Relationships  . Social connections:    Talks on phone: Not on file    Gets together: Not on file    Attends religious service: Not on file    Active member of club or organization: Not on file    Attends meetings of clubs or organizations: Not on file    Relationship status: Not on file  . Intimate partner violence:    Fear of current or ex partner: Not on file  Emotionally abused: Not on file    Physically abused: Not on file    Forced sexual activity: Not on file  Other Topics Concern  . Not on file  Social History Narrative  . Not on file    Current Outpatient Medications on File Prior to Visit  Medication Sig Dispense Refill  . albuterol (VENTOLIN HFA) 108 (90 Base) MCG/ACT inhaler Inhale 1-2 puffs into the lungs every 4 (four) hours as needed for wheezing or shortness of breath. 54 each 3  . Alcohol Swabs (B-D SINGLE USE SWABS REGULAR) PADS Use to swab finger 3 times daily. 300 each 0  . Alirocumab (PRALUENT) 75 MG/ML SOPN Inject 75 mg into the skin every 14 (fourteen) days. 2 pen 3  . ANORO ELLIPTA 62.5-25 MCG/INH AEPB INHALE 1 PUFF EVERY DAY 180 each 0  . aspirin EC 81 MG tablet Take 1 tablet (81 mg total) by mouth daily. 30 tablet 3  . cholecalciferol (VITAMIN D) 1000 UNITS  tablet Take 1 tablet (1,000 Units total) by mouth daily. (Patient taking differently: Take 1,000 Units by mouth 2 (two) times daily. ) 30 tablet 0  . Cinnamon 500 MG capsule Take 500 mg by mouth 2 (two) times daily.    . famotidine (PEPCID) 40 MG tablet TAKE 1 TABLET BY MOUTH ONCE DAILY. DUE FOR FOLLOW-UP APPT. IN MARCH, 2019 MUST SEE PROVIDER FOR FUTURE REFILLS. 90 tablet 0  . hydrochlorothiazide (HYDRODIURIL) 25 MG tablet Take 1 tablet (25 mg total) by mouth daily. 90 tablet 3  . Insulin Pen Needle (NOVOFINE) 32G X 6 MM MISC Use to inject insulin 1 time per day. 90 each 2  . Insulin Syringe-Needle U-100 (INSULIN SYRINGE 1CC/31GX5/16") 31G X 5/16" 1 ML MISC Use to inject insulin 1 time per day. 100 each 2  . Multiple Vitamins-Minerals (MULTIVITAMIN WITH MINERALS) tablet Take 1 tablet by mouth daily.    . Omega-3 Fatty Acids (FISH OIL) 1000 MG CAPS Take 2 capsules by mouth daily.    [name] RELION INSULIN SYRINGE 1ML/31G 31G X 5/16" 1 ML MISC USE TO INJECT INSULIN ONCE DAILY. 100 each 2  . sildenafil (REVATIO) 20 MG tablet Take 5 tablets (100 mg total) by mouth daily as needed. 60 tablet 6  . sildenafil (VIAGRA) 100 MG tablet Take 0.5-1 tablets (50-100 mg total) by mouth daily as needed for erectile dysfunction. 5 tablet 3  . traZODone (DESYREL) 50 MG tablet TAKE 1 TABLET AT BEDTIME AS NEEDED FOR SLEEP 90 tablet 0  . TRUEPLUS LANCETS 28G MISC CHECK BLOOD SUGAR 3 TIMES DAILY. 300 each 1  . guaiFENesin-codeine 100-10 MG/5ML syrup Take 5 mLs by mouth every 6 (six) hours as needed for cough. (Patient not taking: Reported on 08/09/2017) 120 mL 0   No current facility-administered medications on file prior to visit.     Allergies  Allergen Reactions  . Crestor [Rosuvastatin] Other (See Comments)    INTOLERANCE   . Metformin And Related Other (See Comments)    Stomach ache  . Mobic [Meloxicam] Rash    Family History  Problem Relation Age of Onset  . Stroke Mother   . Diabetes Mother   . Hypertension  Mother   . Hyperlipidemia Mother   . Heart disease Father   . Hypertension Father   . Heart attack Father     BP (!) 162/90   Pulse 68   Wt 188 lb 6.4 oz (85.5 kg)   SpO2 98%   BMI 32.34 kg/m   Review of  Systems He denies hypoglycemia    Objective:   Physical Exam VITAL SIGNS:  See vs page GENERAL: no distress Pulses: foot pulses are intact bilaterally.   MSK: no deformity of the feet or ankles.  CV: no edema of the legs or ankles Skin:  no ulcer on the feet or ankles.  normal color and temp on the feet and ankles Neuro: sensation is intact to touch on the feet and ankles.    Lab Results  Component Value Date   HGBA1C 9.6 (A) 08/09/2017      Assessment & Plan:  Insulin-requiring type 2 DM, with PAD. She needs increased rx HTN: is noted today  Patient Instructions  Your blood pressure is high today.  Please see your primary care provider soon, to have it rechecked check your blood sugar 4 times a day: before the 3 meals, and at bedtime.  also check if you have symptoms of your blood sugar being too high or too low.  please keep a record of the readings and bring it to your next appointment here (or you can bring the meter itself).  You can write it on any piece of paper.  please call us sooner if your blood sugar goes below 70, or if you have a lot of readings over 200. Please increase the NPH insulin to 70 units each morning.  However, if you are going to be active, take just 45 units.   On this type of insulin schedule, you should eat meals on a regular schedule.  If a meal is missed or significantly delayed, your blood sugar could go low.   Please come back for a follow-up appointment 2 months.

## 2017-08-22 ENCOUNTER — Other Ambulatory Visit: Payer: Self-pay | Admitting: Internal Medicine

## 2017-08-23 ENCOUNTER — Ambulatory Visit (INDEPENDENT_AMBULATORY_CARE_PROVIDER_SITE_OTHER): Payer: Medicare HMO | Admitting: *Deleted

## 2017-08-23 VITALS — BP 158/92 | Resp 18 | Ht 64.0 in | Wt 188.0 lb

## 2017-08-23 DIAGNOSIS — Z0000 Encounter for general adult medical examination without abnormal findings: Secondary | ICD-10-CM | POA: Diagnosis not present

## 2017-08-23 NOTE — Progress Notes (Signed)
Subjective:   [name] is a 67 y.o. male who presents for Medicare Annual/Subsequent preventive examination.  Review of Systems:  No ROS.  Medicare Wellness Visit. Additional risk factors are reflected in the social history.  Cardiac Risk Factors include: diabetes mellitus;hypertension;male gender;dyslipidemia Sleep patterns: gets up 1-2 times nightly to void and sleeps 7 hours nightly.    Home Safety/Smoke Alarms: Feels safe in home. Smoke alarms in place.  Living environment; residence and Firearm Safety: 1-story house/ trailer, no firearms, Lives alone, no needs for DME, good support system. Seat Belt Safety/Bike Helmet: Wears seat belt.     Objective:    Vitals: Resp 18   Ht 5\' 4"  (1.626 m)   Wt 188 lb (85.3 kg)   SpO2 98%   BMI 32.27 kg/m   Body mass index is 32.27 kg/m.  Advanced Directives 08/23/2017 09/10/2016 06/24/2015 06/07/2015 05/27/2015 05/11/2015 04/25/2015  Does Patient Have a Medical Advance Directive? No No No No No No No  Type of Advance Directive - - - - - - -  Copy of Healthcare Power of Attorney in Chart? - - - - - - -  Would patient like information on creating a medical advance directive? Yes (ED - Information included in AVS) - - - No - patient declined information No - patient declined information No - patient declined information  Pre-existing out of facility DNR order (yellow form or pink MOST form) - - - - - - -    Tobacco Social History   Tobacco Use  Smoking Status Former Smoker  . Packs/day: 1.50  . Years: 46.00  . Pack years: 69.00  . Types: Cigarettes  . Last attempt to quit: 03/19/2013  . Years since quitting: 4.4  Smokeless Tobacco Never Used     Counseling given: Not Answered  Past Medical History:  Diagnosis Date  . Alcoholism ([place])   . Arthritis    "knees, hands" (03/08/2015)  . Asthma   . Chronic bronchitis ([place])   . Chronic diastolic CHF (congestive heart failure) ([place])    notes 03/08/2015  . Cirrhosis ([place])   . COPD  (chronic obstructive pulmonary disease) ([place])   . Coronary artery disease   . Hepatitis C   . High cholesterol   . Hypertension   . Pneumonia 03/2015  . Shortness of breath dyspnea   . Type II diabetes mellitus ([place])    Past Surgical History:  Procedure Laterality Date  . CATARACT EXTRACTION Left 02/2014  . ESOPHAGOGASTRODUODENOSCOPY  02/09/2011   Procedure: ESOPHAGOGASTRODUODENOSCOPY (EGD);  Surgeon: Missy Sabins, MD;  Location[facility] ENDOSCOPY;  Service: Endoscopy;  Laterality: N/A;  . TOTAL KNEE ARTHROPLASTY Left 10/22/2009   Family History  Problem Relation Age of Onset  . Stroke Mother   . Diabetes Mother   . Hypertension Mother   . Hyperlipidemia Mother   . Heart disease Father   . Hypertension Father   . Heart attack Father    Social History   Socioeconomic History  . Marital status: Divorced    Spouse name: Not on file  . Number of children: 4  . Years of education: Not on file  . Highest education level: Not on file  Occupational History  . Occupation: disabled  Social Needs  . Financial resource strain: Somewhat hard  . Food insecurity:    Worry: Never true    Inability: Never true  . Transportation needs:    Medical: No    Non-medical: No  Tobacco Use  .  Smoking status: Former Smoker    Packs/day: 1.50    Years: 46.00    Pack years: 69.00    Types: Cigarettes    Last attempt to quit: 03/19/2013    Years since quitting: 4.4  . Smokeless tobacco: Never Used  Substance and Sexual Activity  . Alcohol use: No    Alcohol/week: 0.0 oz    Comment: "recovering alcoholic; 03/23/1476"  . Drug use: No  . Sexual activity: Not Currently  Lifestyle  . Physical activity:    Days per week: 2 days    Minutes per session: 30 min  . Stress: Only a little  Relationships  . Social connections:    Talks on phone: More than three times a week    Gets together: More than three times a week    Attends religious service: More than 4 times per year    Active member of  club or organization: Yes    Attends meetings of clubs or organizations: More than 4 times per year    Relationship status: Divorced  Other Topics Concern  . Not on file  Social History Narrative  . Not on file    Outpatient Encounter Medications as of 08/23/2017  Medication Sig  . albuterol (VENTOLIN HFA) 108 (90 Base) MCG/ACT inhaler Inhale 1-2 puffs into the lungs every 4 (four) hours as needed for wheezing or shortness of breath.  . Alcohol Swabs (B-D SINGLE USE SWABS REGULAR) PADS Use to swab finger 3 times daily.  . Alirocumab (PRALUENT) 75 MG/ML SOPN Inject 75 mg into the skin every 14 (fourteen) days.  Jearl Klinefelter ELLIPTA 62.5-25 MCG/INH AEPB INHALE 1 PUFF EVERY DAY  . aspirin EC 81 MG tablet Take 1 tablet (81 mg total) by mouth daily.  . cholecalciferol (VITAMIN D) 1000 UNITS tablet Take 1 tablet (1,000 Units total) by mouth daily. (Patient taking differently: Take 1,000 Units by mouth 2 (two) times daily. )  . Cinnamon 500 MG capsule Take 500 mg by mouth 2 (two) times daily.  . famotidine (PEPCID) 40 MG tablet Take 1 tablet (40 mg total) by mouth daily.  [name] glucose blood (ONETOUCH VERIO) test strip 1 each by Other route 2 (two) times daily. And lancets 2/day  . guaiFENesin-codeine 100-10 MG/5ML syrup Take 5 mLs by mouth every 6 (six) hours as needed for cough.  . hydrochlorothiazide (HYDRODIURIL) 25 MG tablet Take 1 tablet (25 mg total) by mouth daily.  . insulin NPH Human (NOVOLIN N) 100 UNIT/ML injection Inject 0.7 mLs (70 Units total) into the skin every morning. And syringes 1/day  . Insulin Pen Needle (NOVOFINE) 32G X 6 MM MISC Use to inject insulin 1 time per day.  . Insulin Syringe-Needle U-100 (INSULIN SYRINGE 1CC/31GX5/16") 31G X 5/16" 1 ML MISC Use to inject insulin 1 time per day.  . Multiple Vitamins-Minerals (MULTIVITAMIN WITH MINERALS) tablet Take 1 tablet by mouth daily.  . Omega-3 Fatty Acids (FISH OIL) 1000 MG CAPS Take 2 capsules by mouth daily.  [name] RELION INSULIN  SYRINGE 1ML/31G 31G X 5/16" 1 ML MISC USE TO INJECT INSULIN ONCE DAILY.  . sildenafil (REVATIO) 20 MG tablet Take 5 tablets (100 mg total) by mouth daily as needed.  . sildenafil (VIAGRA) 100 MG tablet Take 0.5-1 tablets (50-100 mg total) by mouth daily as needed for erectile dysfunction.  . traZODone (DESYREL) 50 MG tablet TAKE 1 TABLET AT BEDTIME AS NEEDED FOR SLEEP  . TRUEPLUS LANCETS 28G MISC CHECK BLOOD SUGAR 3 TIMES DAILY.  No facility-administered encounter medications on file as of 08/23/2017.     Activities of Daily Living In your present state of health, do you have any difficulty performing the following activities: 08/23/2017  Hearing? N  Vision? N  Difficulty concentrating or making decisions? N  Walking or climbing stairs? N  Dressing or bathing? N  Doing errands, shopping? N  Preparing Food and eating ? N  Using the Toilet? N  In the past six months, have you accidently leaked urine? N  Do you have problems with loss of bowel control? N  Managing your Medications? N  Managing your Finances? N  Housekeeping or managing your Housekeeping? N  Some recent data might be hidden    Patient Care Team: Hoyt Koch, MD as PCP - General (Internal Medicine)   Assessment:   This is a routine wellness examination for [place]. Physical assessment deferred to PCP.   Exercise Activities and Dietary recommendations Current Exercise Habits: Home exercise routine, Type of exercise: walking, Time (Minutes): 40, Frequency (Times/Week): 4, Weekly Exercise (Minutes/Week): 160, Intensity: Mild, Exercise limited by: None identified  Diet (meal preparation, eat out, water intake, caffeinated beverages, dairy products, fruits and vegetables): in general, a "healthy" diet     Reviewed heart healthy and diabetic diet, Encouraged patient to increase daily water and fluid intake. Relevant patient education assigned to patient using Emmi. Diet education was attached to patient's AVS.      Goals      Patient Stated   . patient (pt-stated)     Goal is to get credit back and get a car to assist him meeting his needs instead of taking the bus      Other   . Patient Stated     Get more motivated to increase my exercise and improve my diet. I will go to the [employer] x 2-3 weekly. I will decrease the portions of what I eat and monitor how much carbohydrates, fat and sugar I eat.        Fall Risk Fall Risk  08/23/2017 06/17/2017 08/31/2015 06/24/2015 05/10/2015  Falls in the past year? No No No No No   Depression Screen PHQ 2/9 Scores 08/23/2017 06/24/2015 05/10/2015 03/16/2015  PHQ - 2 Score 1 0 0 0  PHQ- 9 Score 2 - - -    Cognitive Function MMSE - Mini Mental State Exam 06/24/2015  Not completed: (No Data)       Ad8 score reviewed for issues:  Issues making decisions: no  Less interest in hobbies / activities: no  Repeats questions, stories (family complaining): no  Trouble using ordinary gadgets (microwave, computer, phone):no  Forgets the month or year: no  Mismanaging finances: no  Remembering appts: no  Daily problems with thinking and/or memory: no Ad8 score is= 0  Immunization History  Administered Date(s) Administered  . Influenza Split 02/10/2011  . Influenza, High Dose Seasonal PF 11/07/2016  . Influenza, Seasonal, Injecte, Preservative Fre 12/19/2012  . Influenza,inj,Quad PF,6+ Mos 12/02/2013, 01/09/2016  . Influenza-Unspecified 01/19/2015  . Pneumococcal Conjugate-13 11/07/2016  . Pneumococcal Polysaccharide-23 12/02/2013   Screening Tests Health Maintenance  Topic Date Due  . URINE MICROALBUMIN  11/08/2016  . INFLUENZA VACCINE  10/03/2017  . OPHTHALMOLOGY EXAM  11/09/2017  . HEMOGLOBIN A1C  02/08/2018  . FOOT EXAM  08/10/2018  . PNA vac Low Risk Adult (2 of 2 - PPSV23) 12/03/2018  . TETANUS/TDAP  03/05/2020  . Fecal DNA (Cologuard)  07/05/2020  . Hepatitis C Screening  Completed  .  HIV Screening  Completed      Plan:     I have  personally reviewed and noted the following in the patient's chart:   . Medical and social history . Use of alcohol, tobacco or illicit drugs  . Current medications and supplements . Functional ability and status . Nutritional status . Physical activity . Advanced directives . List of other physicians . Vitals . Screenings to include cognitive, depression, and falls . Referrals and appointments  In addition, I have reviewed and discussed with patient certain preventive protocols, quality metrics, and best practice recommendations. A written personalized care plan for preventive services as well as general preventive health recommendations were provided to patient.     Michiel Cowboy, RN  08/23/2017

## 2017-08-23 NOTE — Progress Notes (Signed)
Medical screening examination/treatment/procedure(s) were performed by non-physician practitioner and as supervising physician I was immediately available for consultation/collaboration. I agree with above. Marika Mahaffy A Orman Matsumura, MD 

## 2017-08-23 NOTE — Patient Instructions (Addendum)
Continue doing brain stimulating activities (puzzles, reading, adult coloring books, staying active) to keep memory sharp.   Continue to eat heart healthy diet (full of fruits, vegetables, whole grains, lean protein, water--limit salt, fat, and sugar intake) and increase physical activity as tolerated.  www.auntbertha.com or down load app on smart phone  Aunt [name] website lists multiple social resources for individuals such as: food, health, money, house hold goods, transit, medical supplies, job training and legal services.   [name] , Thank you for taking time to come for your Medicare Wellness Visit. I appreciate your ongoing commitment to your health goals. Please review the following plan we discussed and let me know if I can assist you in the future.   These are the goals we discussed: Goals      Patient Stated   . patient (pt-stated)     Goal is to get credit back and get a car to assist him meeting his needs instead of taking the bus      Other   . Patient Stated     Get more motivated to increase my exercise and improve my diet. I will go to the [employer] x 2-3 weekly. I will decrease the portions of what I eat and monitor how much carbohydrates, fat and sugar I eat.        This is a list of the screening recommended for you and due dates:  Health Maintenance  Topic Date Due  . Urine Protein Check  11/08/2016  . Flu Shot  10/03/2017  . Eye exam for diabetics  11/09/2017  . Hemoglobin A1C  02/08/2018  . Complete foot exam   08/10/2018  . Pneumonia vaccines (2 of 2 - PPSV23) 12/03/2018  . Tetanus Vaccine  03/05/2020  . Cologuard (Stool DNA test)  07/05/2020  .  Hepatitis C: One time screening is recommended by Center for Disease Control  (CDC) for  adults born from [birth year] through 1964.   Completed  . HIV Screening  Completed    Eating Healthy on a Budget There are many ways to save money at the grocery store and continue to eat healthy. You can be successful if you plan  your meals according to your budget, purchase according to your budget and grocery list, and prepare food yourself. How can I buy more food on a limited budget? Plan  Plan meals and snacks according to a grocery list and budget you create.  Look for recipes where you can cook once and make enough food for two meals.  Include meals that will "stretch" more expensive foods such as stews, casseroles, and stir-fry dishes.  Make a grocery list and make sure to bring it with you to the store. If you have a smart phone, you could use your phone to create your shopping list. Purchase  When grocery shopping, buy only the items on your grocery list and go only to the areas of the store that have the items on your list. Prepare  Some meal items can be prepared in advance. Pre-cook on days when you have extra time.  Make extra food (such as by doubling recipes) and freeze the extras in meal-sized containers or in individual portions for fast meals and snacks.  Use leftovers in your meal plan for the week.  Try some meatless meals or try "no cook" meals like salads.  When you come home from the grocery store, wash and prepare your fruits and vegetables so they are ready to use and  eat. This will help reduce food waste. How can I buy more food on a limited budget? Try these tips the next time you go shopping:  [place] store brands or generic brands.  Use coupons only for foods and brands you normally buy. Avoid buying items you wouldn't normally buy simply because they are on sale.  Check online and in newspapers for weekly deals.  Buy healthy items from the bulk bins when available, such as herbs, spices, flours, pastas, nuts, and dried fruit.  Buy fruits and vegetables that are in season. Prices are usually lower on in-season produce.  Compare and contrast different items. You can do this by looking at the unit price on the price tag. Use it to compare different brands and sizes to find out  which item is the best deal.  Choose naturally low-cost healthy items, such as carrots, potatoes, apples, bananas, and oranges. Dried or canned beans are a low-cost protein source.  Buy in bulk and freeze extra food. Items you can buy in bulk include meats, fish, poultry, frozen fruits, and frozen vegetables.  Limit the purchase of prepared or "ready-to-eat" foods, such as pre-cut fruits and vegetables and pre-made salads.  If possible, shop around to discover which grocery store offers the best prices. Some stores charge much more than other stores for the same items.  Do not shop when you are hungry. If you shop while hungry, It may be hard to stick to your list and budget.  Stick to your list and resist impulse buys. Treat your list as your official plan for the week.  Buy a variety of vegetables and fruit by purchasing fresh, frozen, and canned items.  Look beyond eye level. Foods at eye level (adult or child eye level) are more expensive. Look at the top and bottom shelves for deals.  Be efficient with your time when shopping. The more time you spend at the store, the more money you are likely to spend.  Consider other retailers such as dollar stores, larger [employer], local fruit and vegetable stands, and farmers markets.  What are some tips for less expensive food substitutions? When choosing more expensive foods like meats and dairy, try these tips to save money:  Choose cheaper cuts of meat, such as bone-in chicken thighs and drumsticks instead skinless and boneless chicken. When you are ready to prepare the chicken, you can remove the skin yourself to make it healthier.  Choose lean meats like chicken or [place]. When choosing ground beef, make sure it is lean ground beef (92% lean, 8% fat). If you do buy a fattier ground beef, drain the fat before eating.  Buy dried beans and peas, such as lentils, split peas, or kidney beans.  For seafood, choose canned tuna, salmon,  or sardines.  Eggs are a low-cost source of protein.  Buy the larger tubs of yogurt instead of individual-sized containers.  Choose water instead of sodas and other sweetened beverages.  Skip buying chips, cookies, and other "junk food". These items are usually expensive, high in calories, and low in nutritional value.  How can I prepare the foods I buy in the healthiest way? Practice these tips for cooking foods in the healthiest way to reduce excess fat and calorie intake:  Steam, saute, grill, or bake foods instead of frying them.  Make sure half your plate is filled with fruits or vegetables. Choose from fresh, frozen, or canned fruits and vegetables. If eating canned, remember to rinse them before eating.  This will remove any excess salt added for packaging.  Trim all fat from meat before cooking. Remove the skin from chicken or [place].  Spoon off fat from meat dishes once they have been chilled in the refrigerator and the fat has hardened on the top.  Use skim milk, low-fat milk, or evaporated skim milk when making cream sauces, soups, or puddings.  Substitute low-fat yogurt, sour cream, or cottage cheese for sour cream and mayonnaise in dips and dressings.  Try lemon juice, herbs, or spices to season food instead of salt, butter, or margarine.  This information is not intended to replace advice given to you by your health care provider. Make sure you discuss any questions you have with your health care provider. Document Released: 10/23/2013 Document Revised: 09/09/2015 Document Reviewed: 09/22/2013 Elsevier Interactive Patient Education  2018 [place] Eating Plan DASH stands for "Dietary Approaches to Stop Hypertension." The DASH eating plan is a healthy eating plan that has been shown to reduce high blood pressure (hypertension). It may also reduce your risk for type 2 diabetes, heart disease, and stroke. The DASH eating plan may also help with weight  loss. What are tips for following this plan? General guidelines  Avoid eating more than 2,300 mg (milligrams) of salt (sodium) a day. If you have hypertension, you may need to reduce your sodium intake to 1,500 mg a day.  Limit alcohol intake to no more than 1 drink a day for nonpregnant women and 2 drinks a day for men. One drink equals 12 oz of beer, 5 oz of Benjimin Hadden, or 1 oz of hard liquor.  Work with your health care provider to maintain a healthy body weight or to lose weight. Ask what an ideal weight is for you.  Get at least 30 minutes of exercise that causes your heart to beat faster (aerobic exercise) most days of the week. Activities may include walking, swimming, or biking.  Work with your health care provider or diet and nutrition specialist (dietitian) to adjust your eating plan to your individual calorie needs. Reading food labels  Check food labels for the amount of sodium per serving. Choose foods with less than 5 percent of the Daily Value of sodium. Generally, foods with less than 300 mg of sodium per serving fit into this eating plan.  To find whole grains, look for the word "whole" as the first word in the ingredient list. Shopping  Buy products labeled as "low-sodium" or "no salt added."  Buy fresh foods. Avoid canned foods and premade or frozen meals. Cooking  Avoid adding salt when cooking. Use salt-free seasonings or herbs instead of table salt or sea salt. Check with your health care provider or pharmacist before using salt substitutes.  Do not fry foods. Cook foods using healthy methods such as baking, boiling, grilling, and broiling instead.  Cook with heart-healthy oils, such as olive, canola, soybean, or sunflower oil. Meal planning   Eat a balanced diet that includes: ? 5 or more servings of fruits and vegetables each day. At each meal, try to fill half of your plate with fruits and vegetables. ? Up to 6-8 servings of whole grains each day. ? Less than 6  oz of lean meat, poultry, or fish each day. A 3-oz serving of meat is about the same size as a deck of cards. One egg equals 1 oz. ? 2 servings of low-fat dairy each day. ? A serving of nuts, seeds, or beans 5 times  each week. ? Heart-healthy fats. Healthy fats called Omega-3 fatty acids are found in foods such as flaxseeds and coldwater fish, like sardines, salmon, and mackerel.  Limit how much you eat of the following: ? Canned or prepackaged foods. ? Food that is high in trans fat, such as fried foods. ? Food that is high in saturated fat, such as fatty meat. ? Sweets, desserts, sugary drinks, and other foods with added sugar. ? Full-fat dairy products.  Do not salt foods before eating.  Try to eat at least 2 vegetarian meals each week.  Eat more home-cooked food and less restaurant, buffet, and fast food.  When eating at a restaurant, ask that your food be prepared with less salt or no salt, if possible. What foods are recommended? The items listed may not be a complete list. Talk with your dietitian about what dietary choices are best for you. Grains Whole-grain or whole-wheat bread. Whole-grain or whole-wheat pasta. Brown rice. [place]. Bulgur. Whole-grain and low-sodium cereals. Pita bread. Low-fat, low-sodium crackers. Whole-wheat flour tortillas. Vegetables Fresh or frozen vegetables (raw, steamed, roasted, or grilled). Low-sodium or reduced-sodium tomato and vegetable juice. Low-sodium or reduced-sodium tomato sauce and tomato paste. Low-sodium or reduced-sodium canned vegetables. Fruits All fresh, dried, or frozen fruit. Canned fruit in natural juice (without added sugar). Meat and other protein foods Skinless chicken or [place]. Ground chicken or [place]. Pork with fat trimmed off. Fish and seafood. Egg whites. Dried beans, peas, or lentils. Unsalted nuts, nut butters, and seeds. Unsalted canned beans. Lean cuts of beef with fat trimmed off. Low-sodium, lean deli  meat. Dairy Low-fat (1%) or fat-free (skim) milk. Fat-free, low-fat, or reduced-fat cheeses. Nonfat, low-sodium ricotta or cottage cheese. Low-fat or nonfat yogurt. Low-fat, low-sodium cheese. Fats and oils Soft margarine without trans fats. Vegetable oil. Low-fat, reduced-fat, or light mayonnaise and salad dressings (reduced-sodium). Canola, safflower, olive, soybean, and sunflower oils. Avocado. Seasoning and other foods Herbs. Spices. Seasoning mixes without salt. Unsalted popcorn and pretzels. Fat-free sweets. What foods are not recommended? The items listed may not be a complete list. Talk with your dietitian about what dietary choices are best for you. Grains Baked goods made with fat, such as croissants, muffins, or some breads. Dry pasta or rice meal packs. Vegetables Creamed or fried vegetables. Vegetables in a cheese sauce. Regular canned vegetables (not low-sodium or reduced-sodium). Regular canned tomato sauce and paste (not low-sodium or reduced-sodium). Regular tomato and vegetable juice (not low-sodium or reduced-sodium). Angie Fava. Olives. Fruits Canned fruit in a light or heavy syrup. Fried fruit. Fruit in cream or butter sauce. Meat and other protein foods Fatty cuts of meat. Ribs. Fried meat. Berniece Salines. Sausage. Bologna and other processed lunch meats. Salami. Fatback. Hotdogs. Bratwurst. Salted nuts and seeds. Canned beans with added salt. Canned or smoked fish. Whole eggs or egg yolks. Chicken or [place] with skin. Dairy Whole or 2% milk, cream, and half-and-half. Whole or full-fat cream cheese. Whole-fat or sweetened yogurt. Full-fat cheese. Nondairy creamers. Whipped toppings. Processed cheese and cheese spreads. Fats and oils Butter. Stick margarine. Lard. Shortening. Ghee. Bacon fat. Tropical oils, such as coconut, palm kernel, or palm oil. Seasoning and other foods Salted popcorn and pretzels. Onion salt, garlic salt, seasoned salt, table salt, and sea salt. Worcestershire  sauce. Tartar sauce. Barbecue sauce. Teriyaki sauce. Soy sauce, including reduced-sodium. Steak sauce. Canned and packaged gravies. Fish sauce. Oyster sauce. Cocktail sauce. Horseradish that you find on the shelf. Ketchup. Mustard. Meat flavorings and tenderizers. Bouillon cubes. Hot sauce  and Tabasco sauce. Premade or packaged marinades. Premade or packaged taco seasonings. Relishes. Regular salad dressings. Where to find more information:  National Heart, Lung, and [place]: https://wilson-eaton.com/  American Heart Association: www.heart.org Summary  The DASH eating plan is a healthy eating plan that has been shown to reduce high blood pressure (hypertension). It may also reduce your risk for type 2 diabetes, heart disease, and stroke.  With the DASH eating plan, you should limit salt (sodium) intake to 2,300 mg a day. If you have hypertension, you may need to reduce your sodium intake to 1,500 mg a day.  When on the DASH eating plan, aim to eat more fresh fruits and vegetables, whole grains, lean proteins, low-fat dairy, and heart-healthy fats.  Work with your health care provider or diet and nutrition specialist (dietitian) to adjust your eating plan to your individual calorie needs. This information is not intended to replace advice given to you by your health care provider. Make sure you discuss any questions you have with your health care provider. Document Released: 02/08/2011 Document Revised: 02/13/2016 Document Reviewed: 02/13/2016 Elsevier Interactive Patient Education  2018 [place] Content in Foods Generally, most healthy people need around 50 grams of protein each day. Depending on your overall health, you may need more or less protein in your diet. Talk to your health care provider or dietitian about how much protein you need. See the following list for the protein content of some common foods. High-protein foods High-protein foods contain 4 grams (4 g) or more  of protein per serving. They include:  Beef, ground sirloin (cooked) - 3 oz have 24 g of protein.  Cheese (hard) - 1 oz has 7 g of protein.  Chicken breast, boneless and skinless (cooked) - 3 oz have 13.4 g of protein.  Cottage cheese - 1/2 cup has 13.4 g of protein.  Egg - 1 egg has 6 g of protein.  Fish, filet (cooked) - 1 oz has 6-7 g of protein.  Garbanzo beans (canned or cooked) - 1/2 cup has 6-7 g of protein.  Kidney beans (canned or cooked) - 1/2 cup has 6-7 g of protein.  Lamb (cooked) - 3 oz has 24 g of protein.  Milk - 1 cup (8 oz) has 8 g of protein.  Nuts (peanuts, pistachios, almonds) - 1 oz has 6 g of protein.  Peanut butter - 1 oz has 7-8 g of protein.  Pork tenderloin (cooked) - 3 oz has 18.4 g of protein.  Pumpkin seeds - 1 oz has 8.5 g of protein.  Soybeans (roasted) - 1 oz has 8 g of protein.  Soybeans (cooked) - 1/2 cup has 11 g of protein.  Soy milk - 1 cup (8 oz) has 5-10 g of protein.  Soy or vegetable patty - 1 patty has 11 g of protein.  Sunflower seeds - 1 oz has 5.5 g of protein.  Tofu (firm) - 1/2 cup has 20 g of protein.  Tuna (canned in water) - 3 oz has 20 g of protein.  Yogurt - 6 oz has 8 g of protein.  Low-protein foods Low-protein foods contain 3 grams (3 g) or less of protein per serving. They include:  Beets (raw or cooked) - 1/2 cup has 1.5 g of protein.  Bran cereal - 1/2 cup has 2-3 g of protein.  Bread - 1 slice has 2.5 g of protein.  Broccoli (raw or cooked) - 1/2 cup has 2 g of  protein.  Collard greens (raw or cooked) - 1/2 cup has 2 g of protein.  Corn (fresh or cooked) - 1/2 cup has 2 g of protein.  Cream cheese - 1 oz has 2 g of protein.  Creamer (half-and-half) - 1 oz has 1 g of protein.  Flour tortilla - 1 tortilla has 2.5 g of protein  Frozen yogurt - 1/2 cup has 3 g of protein.  Fruit or vegetable juice - 1/2 cup has 1 g of protein.  Green beans (raw or cooked) - 1/2 cup has 1 g of  protein.  Green peas (canned) - 1/2 cup has 3.5 g of protein.  Muffins - 1 small muffin (2 oz) has 3 g of protein.  Oatmeal (cooked) - 1/2 cup has 3 g of protein.  Potato (baked with skin) - 1 medium potato has 3 g of protein.  Rice (cooked) - 1/2 cup has 2.5-3.5 g of protein.  Sour cream - 1/2 cup has 2.5 g of protein.  Spinach (cooked) - 1/2 cup has 3 g of protein.  Squash (cooked) - 1/2 cup has 1.5 g of protein.  Actual amounts of protein may be different depending on processing. Talk with your health care provider or dietitian about what foods are recommended for you. This information is not intended to replace advice given to you by your health care provider. Make sure you discuss any questions you have with your health care provider. Document Released: 05/21/2015 Document Revised: 10/31/2015 Document Reviewed: 10/31/2015 Elsevier Interactive Patient Education  [employer].

## 2017-09-23 ENCOUNTER — Other Ambulatory Visit: Payer: Self-pay | Admitting: Internal Medicine

## 2017-10-17 ENCOUNTER — Other Ambulatory Visit: Payer: Self-pay

## 2017-10-17 ENCOUNTER — Telehealth: Payer: Self-pay | Admitting: Endocrinology

## 2017-10-17 ENCOUNTER — Encounter: Payer: Self-pay | Admitting: Endocrinology

## 2017-10-17 ENCOUNTER — Ambulatory Visit (INDEPENDENT_AMBULATORY_CARE_PROVIDER_SITE_OTHER): Payer: Medicare HMO | Admitting: Endocrinology

## 2017-10-17 VITALS — BP 146/80 | HR 73 | Temp 98.5°F | Ht 64.0 in | Wt 188.0 lb

## 2017-10-17 DIAGNOSIS — Z794 Long term (current) use of insulin: Secondary | ICD-10-CM | POA: Diagnosis not present

## 2017-10-17 DIAGNOSIS — E119 Type 2 diabetes mellitus without complications: Secondary | ICD-10-CM

## 2017-10-17 LAB — POCT GLYCOSYLATED HEMOGLOBIN (HGB A1C): Hemoglobin A1C: 9.5 % — AB (ref 4.0–5.6)

## 2017-10-17 MED ORDER — INSULIN NPH (HUMAN) (ISOPHANE) 100 UNIT/ML ~~LOC~~ SUSP: 90.0000 [IU] | SUBCUTANEOUS | 11 refills | Status: DC

## 2017-10-17 MED ORDER — GLUCOSE BLOOD VI STRP: 1.0000 | ORAL_STRIP | Freq: Two times a day (BID) | 12 refills | Status: DC

## 2017-10-17 MED ORDER — ONETOUCH LANCETS MISC: 0 refills | Status: DC

## 2017-10-17 NOTE — Telephone Encounter (Signed)
I have sent to patient;'s pharmacy.  

## 2017-10-17 NOTE — Patient Instructions (Addendum)
Your blood pressure is high today.  Please see your primary care provider soon, to have it rechecked check your blood sugar 4 times a day: before the 3 meals, and at bedtime.  also check if you have symptoms of your blood sugar being too high or too low.  please keep a record of the readings and bring it to your next appointment here (or you can bring the meter itself).  You can write it on any piece of paper.  please call us sooner if your blood sugar goes below 70, or if you have a lot of readings over 200. Please increase the NPH insulin to 90 units each morning.  However, if you are going to be active, take just 60 units.   On this type of insulin schedule, you should eat meals on a regular schedule.  If a meal is missed or significantly delayed, your blood sugar could go low.   Please come back for a follow-up appointment 2 months.

## 2017-10-17 NOTE — Telephone Encounter (Signed)
glucose blood (ONETOUCH VERIO) test strip  And Lancets sent into the pharmacy for new meter.      [place], [place] - [address]

## 2017-10-17 NOTE — Progress Notes (Signed)
Subjective:    Patient ID: [name], male    DOB: 1950/07/27, 67 y.o.   MRN: [id number]  HPI Pt returns for f/u of diabetes mellitus: DM type: Insulin-requiring type 2.  Dx'ed: 7915 Complications: PAD Therapy: insulin since 2016.   DKA: never Severe hypoglycemia: never.  Pancreatitis: never Other: he declines multiple daily injections; he is chronically off and on prednisone, for asthma; he changed lantus to NPH, due to pattern of cbg's; ins declines levemir.    Interval history: no cbg record, but states cbg's are usually over 200.  No recent steroids.  He says he never misses the insulin. Past Medical History:  Diagnosis Date  . Alcoholism ([place])   . Arthritis    "knees, hands" (03/08/2015)  . Asthma   . Chronic bronchitis ([place])   . Chronic diastolic CHF (congestive heart failure) ([place])    notes 03/08/2015  . Cirrhosis ([place])   . COPD (chronic obstructive pulmonary disease) ([place])   . Coronary artery disease   . Hepatitis C   . High cholesterol   . Hypertension   . Pneumonia 03/2015  . Shortness of breath dyspnea   . Type II diabetes mellitus ([place])     Past Surgical History:  Procedure Laterality Date  . CATARACT EXTRACTION Left 02/2014  . ESOPHAGOGASTRODUODENOSCOPY  02/09/2011   Procedure: ESOPHAGOGASTRODUODENOSCOPY (EGD);  Surgeon: Missy Sabins, MD;  Location: [facility] ENDOSCOPY;  Service: Endoscopy;  Laterality: N/A;  . 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 Marital status: Divorced    Spouse name: Not on file  . Number of children: 4  . Years of education: Not on file  . Highest education level: Not on file  Occupational History  . Occupation: disabled  Social Needs  . Financial resource strain: Somewhat hard  . Food insecurity:    Worry: Never true    Inability: Never true  . Transportation needs:    Medical: No    Non-medical: No  Tobacco Use  . Smoking status: Former Smoker    Packs/day: 1.50    Years:  46.00    Pack years: 69.00    Types: Cigarettes    Last attempt to quit: 03/19/2013    Years since quitting: 4.5  . Smokeless tobacco: Never Used  Substance and Sexual Activity  . Alcohol use: No    Alcohol/week: 0.0 standard drinks    Comment: "recovering alcoholic; 0/56/9794"  . Drug use: No  . Sexual activity: Not Currently  Lifestyle  . Physical activity:    Days per week: 2 days    Minutes per session: 30 min  . Stress: Only a little  Relationships  . Social connections:    Talks on phone: More than three times a week    Gets together: More than three times a week    Attends religious service: More than 4 times per year    Active member of club or organization: Yes    Attends meetings of clubs or organizations: More than 4 times per year    Relationship status: Divorced  . Intimate partner violence:    Fear of current or ex partner: Not on file    Emotionally abused: Not on file    Physically abused: Not on file    Forced sexual activity: Not on file  Other Topics Concern  . Not on file  Social History Narrative  . Not on file    Current Outpatient Medications  on File Prior to Visit  Medication Sig Dispense Refill  . albuterol (VENTOLIN HFA) 108 (90 Base) MCG/ACT inhaler Inhale 1-2 puffs into the lungs every 4 (four) hours as needed for wheezing or shortness of breath. 54 each 3  . Alcohol Swabs (B-D SINGLE USE SWABS REGULAR) PADS Use to swab finger 3 times daily. 300 each 0  . Alirocumab (PRALUENT) 75 MG/ML SOPN Inject 75 mg into the skin every 14 (fourteen) days. 2 pen 3  . ANORO ELLIPTA 62.5-25 MCG/INH AEPB INHALE 1 PUFF EVERY DAY 180 each 0  . aspirin EC 81 MG tablet Take 1 tablet (81 mg total) by mouth daily. 30 tablet 3  . cholecalciferol (VITAMIN D) 1000 UNITS tablet Take 1 tablet (1,000 Units total) by mouth daily. (Patient taking differently: Take 1,000 Units by mouth 2 (two) times daily. ) 30 tablet 0  . Cinnamon 500 MG capsule Take 500 mg by mouth 2 (two)  times daily.    . famotidine (PEPCID) 40 MG tablet Take 1 tablet (40 mg total) by mouth daily. 90 tablet 3  . guaiFENesin-codeine 100-10 MG/5ML syrup Take 5 mLs by mouth every 6 (six) hours as needed for cough. 120 mL 0  . hydrochlorothiazide (HYDRODIURIL) 25 MG tablet Take 1 tablet (25 mg total) by mouth daily. 90 tablet 3  . Insulin Pen Needle (NOVOFINE) 32G X 6 MM MISC Use to inject insulin 1 time per day. 90 each 2  . Insulin Syringe-Needle U-100 (INSULIN SYRINGE 1CC/31GX5/16") 31G X 5/16" 1 ML MISC Use to inject insulin 1 time per day. 100 each 2  . Multiple Vitamins-Minerals (MULTIVITAMIN WITH MINERALS) tablet Take 1 tablet by mouth daily.    . Omega-3 Fatty Acids (FISH OIL) 1000 MG CAPS Take 2 capsules by mouth daily.    [name] RELION INSULIN SYRINGE 1ML/31G 31G X 5/16" 1 ML MISC USE TO INJECT INSULIN ONCE DAILY. 100 each 2  . sildenafil (REVATIO) 20 MG tablet Take 5 tablets (100 mg total) by mouth daily as needed. 60 tablet 6  . sildenafil (VIAGRA) 100 MG tablet Take 0.5-1 tablets (50-100 mg total) by mouth daily as needed for erectile dysfunction. 5 tablet 3  . traZODone (DESYREL) 50 MG tablet TAKE 1 TABLET AT BEDTIME AS NEEDED FOR SLEEP 90 tablet 0   No current facility-administered medications on file prior to visit.     Allergies  Allergen Reactions  . Crestor [Rosuvastatin] Other (See Comments)    INTOLERANCE   . Metformin And Related Other (See Comments)    Stomach ache  . Mobic [Meloxicam] Rash    Family History  Problem Relation Age of Onset  . Stroke Mother   . Diabetes Mother   . Hypertension Mother   . Hyperlipidemia Mother   . Heart disease Father   . Hypertension Father   . Heart attack Father     BP (!) 146/80 (BP Location: Right Arm, Patient Position: Sitting, Cuff Size: Normal)   Pulse 73   Temp 98.5 F (36.9 C) (Oral)   Ht 5\' 4"  (1.626 m)   Wt 188 lb (85.3 kg)   SpO2 98%   BMI 32.27 kg/m    Review of Systems He denies hypoglycemia    Objective:     Physical Exam VITAL SIGNS:  See vs page GENERAL: no distress Pulses: dorsalis pedis intact bilat.   MSK: no deformity of the feet CV: no leg edema Skin:  no ulcer on the feet.  normal color and temp on the feet.  Neuro: sensation is intact to touch on the feet Ext: There is bilateral onychomycosis of the toenails       Assessment & Plan:  HTN: is noted today.  Insulin-requiring type 2 DM: he needs increased rx.     Patient Instructions  Your blood pressure is high today.  Please see your primary care provider soon, to have it rechecked check your blood sugar 4 times a day: before the 3 meals, and at bedtime.  also check if you have symptoms of your blood sugar being too high or too low.  please keep a record of the readings and bring it to your next appointment here (or you can bring the meter itself).  You can write it on any piece of paper.  please call us sooner if your blood sugar goes below 70, or if you have a lot of readings over 200. Please increase the NPH insulin to 90 units each morning.  However, if you are going to be active, take just 60 units.   On this type of insulin schedule, you should eat meals on a regular schedule.  If a meal is missed or significantly delayed, your blood sugar could go low.   Please come back for a follow-up appointment 2 months.

## 2017-10-23 ENCOUNTER — Other Ambulatory Visit: Payer: Self-pay

## 2017-10-23 MED ORDER — "INSULIN SYRINGE 31G X 5/16"" 1 ML MISC": 2 refills | Status: DC

## 2017-10-24 ENCOUNTER — Encounter: Payer: Medicare HMO | Admitting: Internal Medicine

## 2017-10-31 NOTE — Telephone Encounter (Signed)
Pt called and stated the pharmacy didn't receive a prescription for his test strips and lancets and wants to know if that can be resent. Thanks.

## 2017-11-01 MED ORDER — GLUCOSE BLOOD VI STRP: 1.0000 | ORAL_STRIP | Freq: Two times a day (BID) | 12 refills | Status: DC

## 2017-11-01 NOTE — Addendum Note (Signed)
Addended by: Drucilla Schmidt on: 11/01/2017 03:51 PM   Modules accepted: Orders

## 2017-11-01 NOTE — Telephone Encounter (Signed)
Resent

## 2017-11-07 ENCOUNTER — Other Ambulatory Visit: Payer: Self-pay

## 2017-11-07 MED ORDER — GLUCOSE BLOOD VI STRP: ORAL_STRIP | 12 refills | Status: DC

## 2017-12-09 ENCOUNTER — Encounter: Payer: Self-pay | Admitting: Internal Medicine

## 2017-12-12 LAB — MICROALBUMIN, URINE: MICROALB UR: 26.19

## 2017-12-19 ENCOUNTER — Other Ambulatory Visit: Payer: Self-pay | Admitting: Internal Medicine

## 2017-12-27 ENCOUNTER — Encounter: Payer: Self-pay | Admitting: Internal Medicine

## 2018-01-03 ENCOUNTER — Telehealth: Payer: Self-pay | Admitting: Endocrinology

## 2018-01-03 ENCOUNTER — Other Ambulatory Visit: Payer: Self-pay

## 2018-01-03 MED ORDER — ACCU-CHEK GUIDE ME W/DEVICE KIT: 1.0000 | PACK | 0 refills | Status: DC | PRN

## 2018-01-03 MED ORDER — GLUCOSE BLOOD VI STRP: ORAL_STRIP | 12 refills | Status: DC

## 2018-01-03 MED ORDER — ACCU-CHEK SOFT TOUCH LANCETS MISC: 12 refills | Status: DC

## 2018-01-03 NOTE — Telephone Encounter (Signed)
Patient called re: patient's insurance does NOT cover One Touch Verio Meter. Patient's insurance sent our office a fax on what type of meter is covered. They cover Accucheck. Please send RX for Accucheck Meter, Lancets and test strips to [facility].

## 2018-01-03 NOTE — Telephone Encounter (Signed)
Per [facility] "Caller states she has a prescription for test strips and he needs to know how many times per day."

## 2018-01-03 NOTE — Telephone Encounter (Signed)
Rx's sent to pt pharmacy of choice as requested.

## 2018-01-06 ENCOUNTER — Other Ambulatory Visit: Payer: Self-pay

## 2018-01-06 MED ORDER — ACCU-CHEK SOFT TOUCH LANCETS MISC: 12 refills | Status: DC

## 2018-01-06 MED ORDER — GLUCOSE BLOOD VI STRP: ORAL_STRIP | 12 refills | Status: DC

## 2018-01-06 NOTE — Telephone Encounter (Signed)
Pt has been notified to monitor CBG's qid, once before each meal and once at bedtime

## 2018-02-12 ENCOUNTER — Other Ambulatory Visit: Payer: Self-pay | Admitting: Internal Medicine

## 2018-02-12 ENCOUNTER — Other Ambulatory Visit: Payer: Self-pay | Admitting: Endocrinology

## 2018-02-12 DIAGNOSIS — J4521 Mild intermittent asthma with (acute) exacerbation: Secondary | ICD-10-CM

## 2018-02-14 ENCOUNTER — Other Ambulatory Visit: Payer: Self-pay

## 2018-02-14 DIAGNOSIS — Z794 Long term (current) use of insulin: Principal | ICD-10-CM

## 2018-02-14 DIAGNOSIS — E119 Type 2 diabetes mellitus without complications: Secondary | ICD-10-CM

## 2018-02-14 MED ORDER — ACCU-CHEK SOFT TOUCH LANCETS MISC: 12 refills | Status: DC

## 2018-03-07 ENCOUNTER — Other Ambulatory Visit: Payer: Self-pay | Admitting: Endocrinology

## 2018-03-17 ENCOUNTER — Other Ambulatory Visit: Payer: Self-pay | Admitting: Internal Medicine

## 2018-03-31 ENCOUNTER — Other Ambulatory Visit: Payer: Self-pay

## 2018-03-31 ENCOUNTER — Telehealth: Payer: Self-pay

## 2018-03-31 DIAGNOSIS — E119 Type 2 diabetes mellitus without complications: Secondary | ICD-10-CM

## 2018-03-31 DIAGNOSIS — Z794 Long term (current) use of insulin: Principal | ICD-10-CM

## 2018-03-31 MED ORDER — GLUCOSE BLOOD VI STRP: ORAL_STRIP | 0 refills | Status: DC

## 2018-03-31 NOTE — Telephone Encounter (Signed)
Patient scheduled for follow up appointment on 04/01/18 at 9:30 a.m.

## 2018-03-31 NOTE — Telephone Encounter (Signed)
Received notification for refill of Accu-Chek test strips. Pt last seen 10/17/17. Advised to f/u in 2 months. No future appt noted. Message routed to scheduling coordinator for scheduling purposes. Request for refill has been authorized for 30 day supply to allow time for pt to schedule appt.[name]

## 2018-03-31 NOTE — Telephone Encounter (Signed)
Received notification for refill of Acuu-Chek test strips. Pt last seen 10/17/17. Advised to f/u in 2 months. No future appt noted. Message routed to scheduling coordinator for scheduling purposes.

## 2018-04-01 ENCOUNTER — Ambulatory Visit (INDEPENDENT_AMBULATORY_CARE_PROVIDER_SITE_OTHER): Payer: Medicare HMO | Admitting: Endocrinology

## 2018-04-01 ENCOUNTER — Encounter: Payer: Self-pay | Admitting: Endocrinology

## 2018-04-01 VITALS — BP 144/78 | HR 80 | Ht 64.0 in | Wt 197.0 lb

## 2018-04-01 DIAGNOSIS — E119 Type 2 diabetes mellitus without complications: Secondary | ICD-10-CM

## 2018-04-01 DIAGNOSIS — Z794 Long term (current) use of insulin: Secondary | ICD-10-CM | POA: Diagnosis not present

## 2018-04-01 LAB — POCT GLYCOSYLATED HEMOGLOBIN (HGB A1C): Hemoglobin A1C: 10.8 % — AB (ref 4.0–5.6)

## 2018-04-01 MED ORDER — INSULIN NPH (HUMAN) (ISOPHANE) 100 UNIT/ML ~~LOC~~ SUSP: 110.0000 [IU] | SUBCUTANEOUS | 11 refills | Status: DC

## 2018-04-01 NOTE — Patient Instructions (Addendum)
Your blood pressure is high today.  Please see your primary care provider soon, to have it rechecked check your blood sugar 4 times a day: before the 3 meals, and at bedtime.  also check if you have symptoms of your blood sugar being too high or too low.  please keep a record of the readings and bring it to your next appointment here (or you can bring the meter itself).  You can write it on any piece of paper.  please call us sooner if your blood sugar goes below 70, or if you have a lot of readings over 200. Please increase the NPH insulin to 110 units each morning.  However, if you are going to be active, take just 80 units.   On this type of insulin schedule, you should eat meals on a regular schedule.  If a meal is missed or significantly delayed, your blood sugar could go low.   Please come back for a follow-up appointment in 1 month.

## 2018-04-01 NOTE — Progress Notes (Signed)
Subjective:    Patient ID: [name], male    DOB: [birth year], 68 y.o.   MRN: [id number]  HPI Pt returns for f/u of diabetes mellitus: DM type: Insulin-requiring type 2.  Dx'ed: 9562 Complications: PAD Therapy: insulin since 2016.   DKA: never Severe hypoglycemia: never.  Pancreatitis: never Other: he declines multiple daily injections; he is chronically off and on prednisone, for asthma; he changed lantus to NPH, due to pattern of cbg's; ins declines levemir.  Interval history: no cbg record, but states cbg's are in the 200's.  He says if he is active and takes the full dose, cbg is in the 100's. No recent steroids.  He says he never misses the insulin.  Chronic back pain persists.  Pt says his diet is limited by availability at food bank.   Past Medical History:  Diagnosis Date  . Alcoholism ([place])   . Arthritis    "knees, hands" (03/08/2015)  . Asthma   . Chronic bronchitis ([place])   . Chronic diastolic CHF (congestive heart failure) ([place])    notes 03/08/2015  . Cirrhosis ([place])   . COPD (chronic obstructive pulmonary disease) ([place])   . Coronary artery disease   . Hepatitis C   . High cholesterol   . Hypertension   . Pneumonia 03/2015  . Shortness of breath dyspnea   . Type II diabetes mellitus ([place])     Past Surgical History:  Procedure Laterality Date  . CATARACT EXTRACTION Left 02/2014  . ESOPHAGOGASTRODUODENOSCOPY  02/09/2011   Procedure: ESOPHAGOGASTRODUODENOSCOPY (EGD);  Surgeon: Missy Sabins, MD;  Location: [facility] ENDOSCOPY;  Service: Endoscopy;  Laterality: N/A;  . 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 Marital status: Divorced    Spouse name: Not on file  . Number of children: 4  . Years of education: Not on file  . Highest education level: Not on file  Occupational History  . Occupation: disabled  Social Needs  . Financial resource strain: Somewhat hard  . Food insecurity:    Worry: Never true    Inability:  Never true  . Transportation needs:    Medical: No    Non-medical: No  Tobacco Use  . Smoking status: Former Smoker    Packs/day: 1.50    Years: 46.00    Pack years: 69.00    Types: Cigarettes    Last attempt to quit: 03/19/2013    Years since quitting: 5.0  . Smokeless tobacco: Never Used  Substance and Sexual Activity  . Alcohol use: No    Alcohol/week: 0.0 standard drinks    Comment: "recovering alcoholic; 04/04/8655"  . Drug use: No  . Sexual activity: Not Currently  Lifestyle  . Physical activity:    Days per week: 2 days    Minutes per session: 30 min  . Stress: Only a little  Relationships  . Social connections:    Talks on phone: More than three times a week    Gets together: More than three times a week    Attends religious service: More than 4 times per year    Active member of club or organization: Yes    Attends meetings of clubs or organizations: More than 4 times per year    Relationship status: Divorced  . Intimate partner violence:    Fear of current or ex partner: Not on file    Emotionally abused: Not on file    Physically abused: Not on file  Forced sexual activity: Not on file  Other Topics Concern  . Not on file  Social History Narrative  . Not on file    Current Outpatient Medications on File Prior to Visit  Medication Sig Dispense Refill  . albuterol (VENTOLIN HFA) 108 (90 Base) MCG/ACT inhaler Inhale 1-2 puffs into the lungs every 4 (four) hours as needed for wheezing or shortness of breath. 54 each 3  . Alcohol Swabs (B-D SINGLE USE SWABS REGULAR) PADS Use to swab finger 3 times daily. 300 each 0  . ANORO ELLIPTA 62.5-25 MCG/INH AEPB INHALE 1 PUFF EVERY DAY 180 each 0  . aspirin EC 81 MG tablet Take 1 tablet (81 mg total) by mouth daily. 30 tablet 3  . Blood Glucose Monitoring Suppl (ACCU-CHEK GUIDE ME) w/Device KIT USE ONCE DAILY AS NEEDED 1 kit 0  . cholecalciferol (VITAMIN D) 1000 UNITS tablet Take 1 tablet (1,000 Units total) by mouth  daily. (Patient taking differently: Take 1,000 Units by mouth 2 (two) times daily. ) 30 tablet 0  . Cinnamon 500 MG capsule Take 500 mg by mouth 2 (two) times daily.    . famotidine (PEPCID) 40 MG tablet Take 1 tablet (40 mg total) by mouth daily. 90 tablet 3  . glucose blood (ACCU-CHEK GUIDE) test strip Use to monitor glucose levels 4 times per day; E11.9 200 each 0  . hydrochlorothiazide (HYDRODIURIL) 25 MG tablet Take 1 tablet (25 mg total) by mouth daily. 90 tablet 3  . Lancets (ACCU-CHEK SOFT TOUCH) lancets Use to check your blood sugars 4 times per day, once before each meal and once at bedtime; E11.9 100 each 12  . Multiple Vitamins-Minerals (MULTIVITAMIN WITH MINERALS) tablet Take 1 tablet by mouth daily.    . Omega-3 Fatty Acids (FISH OIL) 1000 MG CAPS Take 2 capsules by mouth daily.    . sildenafil (REVATIO) 20 MG tablet Take 5 tablets (100 mg total) by mouth daily as needed. 60 tablet 6  . sildenafil (VIAGRA) 100 MG tablet Take 0.5-1 tablets (50-100 mg total) by mouth daily as needed for erectile dysfunction. (Patient not taking: Reported on 04/03/2018) 5 tablet 3  . traZODone (DESYREL) 50 MG tablet TAKE 1 TABLET AT BEDTIME AS NEEDED FOR SLEEP 90 tablet 0   No current facility-administered medications on file prior to visit.     Allergies  Allergen Reactions  . Crestor [Rosuvastatin] Other (See Comments)    INTOLERANCE   . Metformin And Related Other (See Comments)    Stomach ache  . Mobic [Meloxicam] Rash    Family History  Problem Relation Age of Onset  . Stroke Mother   . Diabetes Mother   . Hypertension Mother   . Hyperlipidemia Mother   . Heart disease Father   . Hypertension Father   . Heart attack Father     BP (!) 144/78 (BP Location: Left Arm, Patient Position: Sitting, Cuff Size: Large)   Pulse 80   Ht 5' 4" (1.626 m)   Wt 197 lb (89.4 kg)   SpO2 94%   BMI 33.81 kg/m    Review of Systems He denies hypoglycemia.      Objective:   Physical  Exam VITAL SIGNS:  See vs page GENERAL: no distress Pulses: dorsalis pedis intact bilat.   MSK: no deformity of the feet CV: no leg edema Skin:  no ulcer on the feet.  normal color and temp on the feet. Neuro: sensation is intact to touch on the feet Ext: There is  bilateral onychomycosis of the toenails  Lab Results  Component Value Date   HGBA1C 10.8 (A) 04/01/2018        Assessment & Plan:  Insulin-requiring type 2 DM, with PAD: worse HTN: is noted today Back pain: inactivity may be the cause of increased a1c  Patient Instructions  Your blood pressure is high today.  Please see your primary care provider soon, to have it rechecked check your blood sugar 4 times a day: before the 3 meals, and at bedtime.  also check if you have symptoms of your blood sugar being too high or too low.  please keep a record of the readings and bring it to your next appointment here (or you can bring the meter itself).  You can write it on any piece of paper.  please call us sooner if your blood sugar goes below 70, or if you have a lot of readings over 200. Please increase the NPH insulin to 110 units each morning.  However, if you are going to be active, take just 80 units.   On this type of insulin schedule, you should eat meals on a regular schedule.  If a meal is missed or significantly delayed, your blood sugar could go low.   Please come back for a follow-up appointment in 1 month.

## 2018-04-03 ENCOUNTER — Ambulatory Visit (INDEPENDENT_AMBULATORY_CARE_PROVIDER_SITE_OTHER): Payer: Medicare HMO | Admitting: Internal Medicine

## 2018-04-03 ENCOUNTER — Encounter: Payer: Self-pay | Admitting: Internal Medicine

## 2018-04-03 DIAGNOSIS — I10 Essential (primary) hypertension: Secondary | ICD-10-CM | POA: Diagnosis not present

## 2018-04-03 NOTE — Patient Instructions (Signed)
We are going to check the blood pressure here in 2-3 weeks.

## 2018-04-03 NOTE — Progress Notes (Signed)
   Subjective:   Patient ID: [name], male    DOB: 1950-07-08, 68 y.o.   MRN: [id number]  HPI The patient is a 68 YO man coming in for follow up of blood pressure. He has been having some high readings intermittently at home and then his meter stopped working. He was at endocrine doctor recently and they advised he come talk to [place] about his blood pressure as it was elevated at his last visit with them. Denies headaches or chest pains. Denies muscle pains. He has been struggling with food diet as he does rely on food pantries and food stamps and cannot afford to eat consistently or right for his diabetes.   Review of Systems  Constitutional: Negative.   HENT: Negative.   Eyes: Negative.   Respiratory: Negative for cough, chest tightness and shortness of breath.   Cardiovascular: Negative for chest pain, palpitations and leg swelling.  Gastrointestinal: Negative for abdominal distention, abdominal pain, constipation, diarrhea, nausea and vomiting.  Musculoskeletal: Negative.   Skin: Negative.   Neurological: Negative.   Psychiatric/Behavioral: Negative.     Objective:  Physical Exam Constitutional:      Appearance: He is well-developed. He is obese.  HENT:     Head: Normocephalic and atraumatic.  Neck:     Musculoskeletal: Normal range of motion.  Cardiovascular:     Rate and Rhythm: Normal rate and regular rhythm.  Pulmonary:     Effort: Pulmonary effort is normal. No respiratory distress.     Breath sounds: Normal breath sounds. No wheezing or rales.  Abdominal:     General: Bowel sounds are normal. There is no distension.     Palpations: Abdomen is soft.     Tenderness: There is no abdominal tenderness. There is no rebound.  Skin:    General: Skin is warm and dry.  Neurological:     Mental Status: He is alert and oriented to person, place, and time.     Coordination: Coordination normal.     Vitals:   04/03/18 1602 04/03/18 1635  BP: (!) 162/70 140/70  Pulse: 96    Temp: 98.1 F (36.7 C)   TempSrc: Oral   SpO2: 98%   Weight: 196 lb (88.9 kg)   Height: 5\' 4"  (1.626 m)     Assessment & Plan:

## 2018-04-04 NOTE — Assessment & Plan Note (Signed)
BP at goal on recheck in the office. Currently taking hctz 25 mg daily. If needs titration in the future would add ACE-I due to concurrent diabetes. We will have him return to our office for BP check in 2-3 weeks and if persistently elevated will change hctz to lisinopril/hctz.

## 2018-04-09 DIAGNOSIS — H04123 Dry eye syndrome of bilateral lacrimal glands: Secondary | ICD-10-CM | POA: Diagnosis not present

## 2018-04-09 DIAGNOSIS — H40013 Open angle with borderline findings, low risk, bilateral: Secondary | ICD-10-CM | POA: Diagnosis not present

## 2018-04-09 DIAGNOSIS — Z961 Presence of intraocular lens: Secondary | ICD-10-CM | POA: Diagnosis not present

## 2018-04-09 DIAGNOSIS — E119 Type 2 diabetes mellitus without complications: Secondary | ICD-10-CM | POA: Diagnosis not present

## 2018-04-09 LAB — HM DIABETES EYE EXAM

## 2018-04-17 ENCOUNTER — Ambulatory Visit: Payer: Medicare HMO

## 2018-04-17 ENCOUNTER — Telehealth: Payer: Self-pay

## 2018-04-17 NOTE — Telephone Encounter (Signed)
He should be taking BP meds consistently in the morning and can come back in 2 weeks once doing that to check levels.

## 2018-04-17 NOTE — Telephone Encounter (Signed)
Patient came in this morning to have his blood pressure checked (nurse visit)--he saw dr Sharlet Salina about 2 weeks ago ---no changes in medications at that time--however, he was told to watch his blood pressure readings and monitor if any improvement--when he came first thing this morning, his blood pressure was taken before he had taken medication---his reading was 158/86---patient then took medication about 11am and returned this afternoon (appx 3.5 hours after bp med administration) to see if any improvement---blood pressure reading on 2nd visit today was unchanged---he does not have a reliable home use machine right now, but will come for nurse visits as needed to monitor bp---routing to dr crawford---do you want to make any changes to bp med dosing at this time, please advise, I will call patient back, thanks

## 2018-04-18 NOTE — Telephone Encounter (Signed)
Patient advised of dr crawfords note/instructions---he will take consistently same time every day for 2 weeks and then return to have bp rechecked---call us sooner if symptomatic---

## 2018-04-21 ENCOUNTER — Other Ambulatory Visit: Payer: Self-pay

## 2018-04-21 ENCOUNTER — Telehealth: Payer: Self-pay | Admitting: Endocrinology

## 2018-04-21 MED ORDER — INSULIN NPH (HUMAN) (ISOPHANE) 100 UNIT/ML ~~LOC~~ SUSP: 110.0000 [IU] | SUBCUTANEOUS | 11 refills | Status: DC

## 2018-04-21 NOTE — Telephone Encounter (Signed)
MEDICATION: insulin NPH Human (NOVOLIN N) 100 UNIT/ML injection  PHARMACY:  [place], [place]  IS THIS A 90 DAY SUPPLY : yes  IS PATIENT OUT OF MEDICATION: yes  IF NOT; HOW MUCH IS LEFT:   LAST APPOINTMENT DATE: @1 /28/2020  NEXT APPOINTMENT DATE:@2 /28/2020  DO WE HAVE YOUR PERMISSION TO LEAVE A DETAILED MESSAGE:  OTHER COMMENTS:  Patient would like a call back once this has been sent    **Let patient know to contact pharmacy at the end of the day to make sure medication is ready. **  ** Please notify patient to allow 48-72 hours to process**  **Encourage patient to contact the pharmacy for refills or they can request refills through [facility]**

## 2018-04-21 NOTE — Telephone Encounter (Signed)
insulin NPH Human (NOVOLIN N) 100 UNIT/ML injection 30 mL 11 04/21/2018    Sig - Route: Inject 1.1 mLs (110 Units total) into the skin every morning. And syringes 2/day - Subcutaneous   Sent to pharmacy as: insulin NPH Human (NOVOLIN N) 100 UNIT/ML injection   E-Prescribing Status: Receipt confirmed by pharmacy (04/21/2018 3:22 PM EST)

## 2018-05-02 ENCOUNTER — Ambulatory Visit: Payer: Medicare HMO | Admitting: Endocrinology

## 2018-05-07 ENCOUNTER — Other Ambulatory Visit: Payer: Self-pay

## 2018-05-07 ENCOUNTER — Ambulatory Visit (INDEPENDENT_AMBULATORY_CARE_PROVIDER_SITE_OTHER): Payer: Medicare HMO | Admitting: Endocrinology

## 2018-05-07 ENCOUNTER — Encounter: Payer: Self-pay | Admitting: Endocrinology

## 2018-05-07 VITALS — BP 142/88 | HR 82 | Ht 64.0 in | Wt 196.6 lb

## 2018-05-07 DIAGNOSIS — Z794 Long term (current) use of insulin: Secondary | ICD-10-CM | POA: Diagnosis not present

## 2018-05-07 DIAGNOSIS — E119 Type 2 diabetes mellitus without complications: Secondary | ICD-10-CM | POA: Diagnosis not present

## 2018-05-07 NOTE — Progress Notes (Signed)
Subjective:    Patient ID: [name], male    DOB: Oct 11, 1950, 69 y.o.   MRN: [id number]  HPI Pt returns for f/u of diabetes mellitus: DM type: Insulin-requiring type 2.  Dx'ed: 1833 Complications: PAD Therapy: insulin since 2016.   DKA: never Severe hypoglycemia: never.  Pancreatitis: never.   Other: he declines multiple daily injections; he is chronically off and on prednisone, for asthma; he changed lantus to NPH, due to pattern of cbg's; ins declines levemir.  Interval history: Meter is downloaded today, and the printout is scanned into the record.  cbg varies from 95-300.  It is in general higher as the day goes on.  No recent steroids.  He says he never misses the insulin.  He takes 100 units qam.  Past Medical History:  Diagnosis Date  . Alcoholism ([place])   . Arthritis    "knees, hands" (03/08/2015)  . Asthma   . Chronic bronchitis ([place])   . Chronic diastolic CHF (congestive heart failure) ([place])    notes 03/08/2015  . Cirrhosis ([place])   . COPD (chronic obstructive pulmonary disease) ([place])   . Coronary artery disease   . Hepatitis C   . High cholesterol   . Hypertension   . Pneumonia 03/2015  . Shortness of breath dyspnea   . Type II diabetes mellitus ([place])     Past Surgical History:  Procedure Laterality Date  . CATARACT EXTRACTION Left 02/2014  . ESOPHAGOGASTRODUODENOSCOPY  02/09/2011   Procedure: ESOPHAGOGASTRODUODENOSCOPY (EGD);  Surgeon: Missy Sabins, MD;  [facility] END[facility]COPY;  Service: Endoscopy;  Laterality: N/A;  . 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 Marital status: Divorced    Spouse name: Not on file  . Number of children: 4  . Years of education: Not on file  . Highest education level: Not on file  Occupational History  . Occupation: disabled  Social Needs  . Financial resource strain: Somewhat hard  . Food insecurity:    Worry: Never true    Inability: Never true  . Transportation needs:   Medical: No    Non-medical: No  Tobacco Use  . Smoking status: Former Smoker    Packs/day: 1.50    Years: 46.00    Pack years: 69.00    Types: Cigarettes    Last attempt to quit: 03/19/2013    Years since quitting: 5.1  . Smokeless tobacco: Never Used  Substance and Sexual Activity  . Alcohol use: No    Alcohol/week: 0.0 standard drinks    Comment: "recovering alcoholic; 5/82/5189"  . Drug use: No  . Sexual activity: Not Currently  Lifestyle  . Physical activity:    Days per week: 2 days    Minutes per session: 30 min  . Stress: Only a little  Relationships  . Social connections:    Talks on phone: More than three times a week    Gets together: More than three times a week    Attends religious service: More than 4 times per year    Active member of club or organization: Yes    Attends meetings of clubs or organizations: More than 4 times per year    Relationship status: Divorced  . Intimate partner violence:    Fear of current or ex partner: Not on file    Emotionally abused: Not on file    Physically abused: Not on file    Forced sexual activity: Not on file  Other Topics Concern  . Not on file  Social History Narrative  . Not on file    Current Outpatient Medications on File Prior to Visit  Medication Sig Dispense Refill  . albuterol (VENTOLIN HFA) 108 (90 Base) MCG/ACT inhaler Inhale 1-2 puffs into the lungs every 4 (four) hours as needed for wheezing or shortness of breath. 54 each 3  . Alcohol Swabs (B-D SINGLE USE SWABS REGULAR) PADS Use to swab finger 3 times daily. 300 each 0  . ANORO ELLIPTA 62.5-25 MCG/INH AEPB INHALE 1 PUFF EVERY DAY 180 each 0  . aspirin EC 81 MG tablet Take 1 tablet (81 mg total) by mouth daily. 30 tablet 3  . Blood Glucose Monitoring Suppl (ACCU-CHEK GUIDE ME) w/Device KIT USE ONCE DAILY AS NEEDED 1 kit 0  . cholecalciferol (VITAMIN D) 1000 UNITS tablet Take 1 tablet (1,000 Units total) by mouth daily. (Patient taking differently: Take  1,000 Units by mouth 2 (two) times daily. ) 30 tablet 0  . Cinnamon 500 MG capsule Take 500 mg by mouth 2 (two) times daily.    . famotidine (PEPCID) 40 MG tablet Take 1 tablet (40 mg total) by mouth daily. 90 tablet 3  . glucose blood (ACCU-CHEK GUIDE) test strip Use to monitor glucose levels 4 times per day; E11.9 200 each 0  . hydrochlorothiazide (HYDRODIURIL) 25 MG tablet Take 1 tablet (25 mg total) by mouth daily. 90 tablet 3  . insulin NPH Human (NOVOLIN N) 100 UNIT/ML injection Inject 1.1 mLs (110 Units total) into the skin every morning. And syringes 2/day 30 mL 11  . Lancets (ACCU-CHEK SOFT TOUCH) lancets Use to check your blood sugars 4 times per day, once before each meal and once at bedtime; E11.9 100 each 12  . Multiple Vitamins-Minerals (MULTIVITAMIN WITH MINERALS) tablet Take 1 tablet by mouth daily.    . Omega-3 Fatty Acids (FISH OIL) 1000 MG CAPS Take 2 capsules by mouth daily.    . sildenafil (REVATIO) 20 MG tablet Take 5 tablets (100 mg total) by mouth daily as needed. 60 tablet 6  . sildenafil (VIAGRA) 100 MG tablet Take 0.5-1 tablets (50-100 mg total) by mouth daily as needed for erectile dysfunction. 5 tablet 3  . traZODone (DESYREL) 50 MG tablet TAKE 1 TABLET AT BEDTIME AS NEEDED FOR SLEEP 90 tablet 0   No current facility-administered medications on file prior to visit.     Allergies  Allergen Reactions  . Crestor [Rosuvastatin] Other (See Comments)    INTOLERANCE   . Metformin And Related Other (See Comments)    Stomach ache  . Mobic [Meloxicam] Rash    Family History  Problem Relation Age of Onset  . Stroke Mother   . Diabetes Mother   . Hypertension Mother   . Hyperlipidemia Mother   . Heart disease Father   . Hypertension Father   . Heart attack Father     BP (!) 142/88 (BP Location: Left Arm, Patient Position: Sitting, Cuff Size: Normal)   Pulse 82   Ht '5\' 4"'  (1.626 m)   Wt 196 lb 9.6 oz (89.2 kg)   SpO2 96%   BMI 33.75 kg/m   Review of  Systems He denies hypoglycemia.     Objective:   Physical Exam VITAL SIGNS:  See vs page GENERAL: no distress Pulses: dorsalis pedis intact bilat.   MSK: no deformity of the feet CV: no leg edema Skin:  no ulcer on the feet.  normal color and temp on  the feet. Neuro: sensation is intact to touch on the feet Ext: There is bilateral onychomycosis of the toenails.  Lab Results  Component Value Date   CREATININE 0.90 06/17/2017   BUN 16 06/17/2017   NA 131 (L) 06/17/2017   K 3.5 06/17/2017   CL 97 06/17/2017   CO2 27 06/17/2017    Lab Results  Component Value Date   HGBA1C 10.8 (A) 04/01/2018       Assessment & Plan:  HTN: is noted today Insulin-requiring type 2 DM, with PAD: worse.   Patient Instructions  Your blood pressure is high today.  Please see your primary care provider soon, to have it rechecked check your blood sugar 4 times a day: before the 3 meals, and at bedtime.  also check if you have symptoms of your blood sugar being too high or too low.  please keep a record of the readings and bring it to your next appointment here (or you can bring the meter itself).  You can write it on any piece of paper.  please call us sooner if your blood sugar goes below 70, or if you have a lot of readings over 200. Please increase the NPH insulin to 110 units each morning.  However, if you are going to be active, take just 80 units.   On this type of insulin schedule, you should eat meals on a regular schedule.  If a meal is missed or significantly delayed, your blood sugar could go low.   Please come back for a follow-up appointment in 6 weeks.

## 2018-05-07 NOTE — Patient Instructions (Addendum)
Your blood pressure is high today.  Please see your primary care provider soon, to have it rechecked check your blood sugar 4 times a day: before the 3 meals, and at bedtime.  also check if you have symptoms of your blood sugar being too high or too low.  please keep a record of the readings and bring it to your next appointment here (or you can bring the meter itself).  You can write it on any piece of paper.  please call us sooner if your blood sugar goes below 70, or if you have a lot of readings over 200. Please increase the NPH insulin to 110 units each morning.  However, if you are going to be active, take just 80 units.   On this type of insulin schedule, you should eat meals on a regular schedule.  If a meal is missed or significantly delayed, your blood sugar could go low.   Please come back for a follow-up appointment in 6 weeks.

## 2018-05-10 ENCOUNTER — Other Ambulatory Visit: Payer: Self-pay | Admitting: Internal Medicine

## 2018-05-27 ENCOUNTER — Other Ambulatory Visit: Payer: Self-pay | Admitting: Internal Medicine

## 2018-06-18 ENCOUNTER — Ambulatory Visit: Payer: Medicare HMO | Admitting: Endocrinology

## 2018-07-18 ENCOUNTER — Ambulatory Visit: Payer: Medicare HMO | Admitting: Endocrinology

## 2018-07-23 ENCOUNTER — Other Ambulatory Visit: Payer: Self-pay | Admitting: Endocrinology

## 2018-07-23 DIAGNOSIS — E119 Type 2 diabetes mellitus without complications: Secondary | ICD-10-CM

## 2018-08-08 ENCOUNTER — Other Ambulatory Visit: Payer: Self-pay

## 2018-08-09 ENCOUNTER — Other Ambulatory Visit: Payer: Self-pay | Admitting: Internal Medicine

## 2018-08-12 ENCOUNTER — Ambulatory Visit: Payer: Medicare HMO | Admitting: Endocrinology

## 2018-08-12 ENCOUNTER — Other Ambulatory Visit: Payer: Self-pay

## 2018-08-12 DIAGNOSIS — N529 Male erectile dysfunction, unspecified: Secondary | ICD-10-CM

## 2018-08-12 MED ORDER — SILDENAFIL CITRATE 100 MG PO TABS: 50.0000 mg | ORAL_TABLET | Freq: Every day | ORAL | 3 refills | Status: DC | PRN

## 2018-08-12 NOTE — Addendum Note (Signed)
Addended by: Pricilla Holm A on: 08/12/2018 10:56 AM   Modules accepted: Orders

## 2018-08-28 NOTE — Progress Notes (Signed)
Subjective:   [name] is a 68 y.o. male who presents for Medicare Annual/Subsequent preventive examination.  Review of Systems:   Cardiac Risk Factors include: diabetes mellitus;advanced age (>30mn, >>74women);dyslipidemia;hypertension;male gender;obesity (BMI >30kg/m2) Sleep patterns: feels rested on waking, gets up 1-2 times nightly to void and sleeps 6-7 hours nightly.    Home Safety/Smoke Alarms: Feels safe in home. Smoke alarms in place.  Living environment; residence and Firearm Safety: 1-story house/ trailer. Lives alone, no needs for DME, good support system Seat Belt Safety/Bike Helmet: Wears seat belt.     Objective:    Vitals: BP (!) 156/90   Pulse 70   Resp 18   Ht '5\' 4"'  (1.626 m)   Wt 202 lb (91.6 kg)   SpO2 100%   BMI 34.67 kg/m   Body mass index is 34.67 kg/m.  Advanced Directives 08/29/2018 08/23/2017 09/10/2016 06/24/2015 06/07/2015 05/27/2015 05/11/2015  Does Patient Have a Medical Advance Directive? No No No No No No No  Type of Advance Directive - - - - - - -  Copy of Healthcare Power of Attorney in Chart? - - - - - - -  Would patient like information on creating a medical advance directive? Yes (ED - Information included in AVS) Yes (ED - Information included in AVS) - - - No - patient declined information No - patient declined information  Pre-existing out of facility DNR order (yellow form or pink MOST form) - - - - - - -    Tobacco Social History   Tobacco Use  Smoking Status Former Smoker  . Packs/day: 1.50  . Years: 46.00  . Pack years: 69.00  . Types: Cigarettes  . Quit date: 03/19/2013  . Years since quitting: 5.4  Smokeless Tobacco Never Used     Counseling given: Not Answered  Past Medical History:  Diagnosis Date  . Alcoholism ([place]   . Arthritis    "knees, hands" (03/08/2015)  . Asthma   . [place] bronchitis [place]   . Chronic diastolic CHF (congestive heart failure) ([place]    notes 03/08/2015  . Cirrhosis ([place]   . COPD (chronic  obstructive pulmonary disease) ([place]   . Coronary artery disease   . Hepatitis C   . High cholesterol   . Hypertension   . Pneumonia 03/2015  . Shortness of breath dyspnea   . Type II diabetes mellitus ([place]    Past Surgical History:  Procedure Laterality Date  . CATARACT EXTRACTION Left 02/2014  . ESOPHAGOGASTRODUODENOSCOPY  02/09/2011   Procedure: ESOPHAGOGASTRODUODENOSCOPY (EGD);  Surgeon: JMissy Sabins MD;  [facility];  Service: Endoscopy;  Laterality: N/A;  . TOTAL KNEE ARTHROPLASTY Left 10/22/2009   Family History  Problem Relation Age of Onset  . Stroke Mother   . Diabetes Mother   . Hypertension Mother   . Hyperlipidemia Mother   . Heart disease Father   . Hypertension Father   . Heart attack Father    Social History   Socioeconomic History  . Marital status: Divorced    Spouse name: Not on file  . Number of children: 4  . Years of education: Not on file  . Highest education level: Not on file  Occupational History  . Occupation: disabled  Social Needs  . Financial resource strain: Somewhat hard  . Food insecurity    Worry: Never true    Inability: Never true  . Transportation needs    Medical: No    Non-medical: No  Tobacco  Use  . Smoking status: Former Smoker    Packs/day: 1.50    Years: 46.00    Pack years: 69.00    Types: Cigarettes    Quit date: 03/19/2013    Years since quitting: 5.4  . Smokeless tobacco: Never Used  Substance and Sexual Activity  . Alcohol use: No    Alcohol/week: 0.0 standard drinks    Comment: "recovering alcoholic; 06/29/621"  . Drug use: No  . Sexual activity: Not Currently  Lifestyle  . Physical activity    Days per week: 2 days    Minutes per session: 40 min  . Stress: Only a little  Relationships  . Social connections    Talks on phone: More than three times a week    Gets together: More than three times a week    Attends religious service: More than 4 times per year    Active member of club or  organization: Yes    Attends meetings of clubs or organizations: More than 4 times per year    Relationship status: Divorced  Other Topics Concern  . Not on file  Social History Narrative  . Not on file    Outpatient Encounter Medications as of 08/29/2018  Medication Sig  . ACCU-CHEK GUIDE test strip USE TO MONITOR GLUCOSE LEVELS 4 TIMES PER DAY AS DIRECTED  . albuterol (VENTOLIN HFA) 108 (90 Base) MCG/ACT inhaler Inhale 1-2 puffs into the lungs every 4 (four) hours as needed for wheezing or shortness of breath.  . Alcohol Swabs (B-D SINGLE USE SWABS REGULAR) PADS Use to swab finger 3 times daily.  Jearl Klinefelter ELLIPTA 62.5-25 MCG/INH AEPB INHALE 1 PUFF EVERY DAY  . aspirin EC 81 MG tablet Take 1 tablet (81 mg total) by mouth daily.  . Blood Glucose Monitoring Suppl (ACCU-CHEK GUIDE ME) w/Device KIT USE ONCE DAILY AS NEEDED  . cholecalciferol (VITAMIN D) 1000 UNITS tablet Take 1 tablet (1,000 Units total) by mouth daily. (Patient taking differently: Take 1,000 Units by mouth 2 (two) times daily. )  . Cinnamon 500 MG capsule Take 500 mg by mouth 2 (two) times daily.  . famotidine (PEPCID) 40 MG tablet Take 1 tablet (40 mg total) by mouth daily.  . hydrochlorothiazide (HYDRODIURIL) 25 MG tablet Take 1 tablet by mouth daily  . insulin NPH Human (NOVOLIN N) 100 UNIT/ML injection Inject 1.1 mLs (110 Units total) into the skin every morning. And syringes 2/day  . Lancets (ACCU-CHEK SOFT TOUCH) lancets Use to check your blood sugars 4 times per day, once before each meal and once at bedtime; E11.9  . Multiple Vitamins-Minerals (MULTIVITAMIN WITH MINERALS) tablet Take 1 tablet by mouth daily.  . Omega-3 Fatty Acids (FISH OIL) 1000 MG CAPS Take 2 capsules by mouth daily.  . sildenafil (REVATIO) 20 MG tablet TAKE 5 TABLETS BY MOUTH DAILY AS NEEDED  . sildenafil (VIAGRA) 100 MG tablet Take 0.5-1 tablets (50-100 mg total) by mouth daily as needed for erectile dysfunction.  . traZODone (DESYREL) 50 MG tablet  TAKE 1 TABLET AT BEDTIME AS NEEDED FOR SLEEP   No facility-administered encounter medications on file as of 08/29/2018.     Activities of Daily Living In your present state of health, do you have any difficulty performing the following activities: 08/29/2018  Hearing? N  Vision? N  Difficulty concentrating or making decisions? N  Walking or climbing stairs? N  Dressing or bathing? N  Doing errands, shopping? N  Preparing Food and eating ? N  Using the  Toilet? N  In the past six months, have you accidently leaked urine? N  Do you have problems with loss of bowel control? N  Managing your Medications? N  Managing your Finances? N  Housekeeping or managing your Housekeeping? N  Some recent data might be hidden    Patient Care Team: Hoyt Koch, MD as PCP - General (Internal Medicine)   Assessment:   This is a routine wellness examination for [place]. Physical assessment deferred to PCP.   Exercise Activities and Dietary recommendations Current Exercise Habits: The patient has a physically strenuous job, but has no regular exercise apart from work.(mows a couple of yards routinely)  Diet (meal preparation, eat out, water intake, caffeinated beverages, dairy products, fruits and vegetables): in general, a "healthy" diet     Reviewed heart healthy and diabetic diet. Encouraged patient to maintain daily water and healthy fluid intake. Relevant patient education assigned to patient using Emmi.  Goals    . Patient Stated     Get more motivated to increase my exercise and improve my diet. I will walk 2-3  times weekly. I will decrease the portions of what I eat and monitor how much carbohydrates, fat and sugar I eat.        Fall Risk Fall Risk  08/29/2018 08/23/2017 06/17/2017 08/31/2015 06/24/2015  Falls in the past year? 0 No No No No  Number falls in past yr: 0 - - - -    Depression Screen PHQ 2/9 Scores 08/29/2018 08/23/2017 06/24/2015 05/10/2015  PHQ - 2 Score 1 1 0 0   PHQ- 9 Score - 2 - -    Cognitive Function MMSE - Mini Mental State Exam 06/24/2015  Not completed: (No Data)       Ad8 score reviewed for issues:  Issues making decisions: no  Less interest in hobbies / activities: no  Repeats questions, stories (family complaining): no  Trouble using ordinary gadgets (microwave, computer, phone):no  Forgets the month or year: no  Mismanaging finances: no  Remembering appts: no  Daily problems with thinking and/or memory: no Ad8 score is= 0  Immunization History  Administered Date(s) Administered  . Influenza Split 02/10/2011  . Influenza, High Dose Seasonal PF 11/07/2016  . Influenza, Seasonal, Injecte, Preservative Fre 12/19/2012  . Influenza,inj,Quad PF,6+ Mos 12/02/2013, 01/09/2016  . Influenza-Unspecified 01/19/2015, 01/18/2018  . Pneumococcal Conjugate-13 11/07/2016  . Pneumococcal Polysaccharide-23 12/02/2013    Screening Tests Health Maintenance  Topic Date Due  . OPHTHALMOLOGY EXAM  11/09/2017  . HEMOGLOBIN A1C  09/30/2018  . INFLUENZA VACCINE  10/04/2018  . PNA vac Low Risk Adult (2 of 2 - PPSV23) 12/03/2018  . URINE MICROALBUMIN  12/13/2018  . FOOT EXAM  05/07/2019  . TETANUS/TDAP  03/05/2020  . Fecal DNA (Cologuard)  07/05/2020  . Hepatitis C Screening  Completed      Plan:   Colonoscopy referral placed to follow-up with positive cologuard  [facility] referral placed, patient wants to work towards lowering his Hgb A1c  I have personally reviewed and noted the following in the patient's chart:   . Medical and social history . Use of alcohol, tobacco or illicit drugs  . Current medications and supplements . Functional ability and status . Nutritional status . Physical activity . Advanced directives . List of other physicians . Vitals . Screenings to include cognitive, depression, and falls . Referrals and appointments  In addition, I have reviewed and discussed with patient certain preventive protocols,  quality metrics, and best practice  recommendations. A written personalized care plan for preventive services as well as general preventive health recommendations were provided to patient.     Michiel Cowboy, RN  08/29/2018

## 2018-08-29 ENCOUNTER — Other Ambulatory Visit: Payer: Self-pay

## 2018-08-29 ENCOUNTER — Ambulatory Visit (INDEPENDENT_AMBULATORY_CARE_PROVIDER_SITE_OTHER): Payer: Medicare HMO | Admitting: *Deleted

## 2018-08-29 VITALS — BP 156/90 | HR 70 | Resp 18 | Ht 64.0 in | Wt 202.0 lb

## 2018-08-29 DIAGNOSIS — E1169 Type 2 diabetes mellitus with other specified complication: Secondary | ICD-10-CM | POA: Diagnosis not present

## 2018-08-29 DIAGNOSIS — Z794 Long term (current) use of insulin: Secondary | ICD-10-CM | POA: Diagnosis not present

## 2018-08-29 DIAGNOSIS — E119 Type 2 diabetes mellitus without complications: Secondary | ICD-10-CM | POA: Diagnosis not present

## 2018-08-29 DIAGNOSIS — Z1211 Encounter for screening for malignant neoplasm of colon: Secondary | ICD-10-CM | POA: Diagnosis not present

## 2018-08-29 DIAGNOSIS — E785 Hyperlipidemia, unspecified: Secondary | ICD-10-CM

## 2018-08-29 DIAGNOSIS — Z0000 Encounter for general adult medical examination without abnormal findings: Secondary | ICD-10-CM | POA: Diagnosis not present

## 2018-08-29 NOTE — Progress Notes (Signed)
Medical screening examination/treatment/procedure(s) were performed by non-physician practitioner and as supervising physician I was immediately available for consultation/collaboration. I agree with above. Elizabeth A Crawford, MD 

## 2018-08-29 NOTE — Patient Instructions (Addendum)
If you cannot attend class in person, you can still exercise at home. Video taped versions of AHOY classes are shown on [employer] (GTN) at 8 am and 1 pm Mondays through Fridays. You can also purchase a copy of the AHOY DVD by calling [place] (GTN) [employer]. GTN is available on [employer] channel 13 with a digital cable box and on [employer] channel 31. GTN is also available on [employer], channel 99. To view GTN, go to channel 99, press OK, select [place], then select GTN to start the channel.  Continue doing brain stimulating activities (puzzles, reading, adult coloring books, staying active) to keep memory sharp.    [name] , Thank you for taking time to come for your Medicare Wellness Visit. I appreciate your ongoing commitment to your health goals. Please review the following plan we discussed and let me know if I can assist you in the future.   These are the goals we discussed: Goals      Patient Stated   . patient (pt-stated)     Goal is to get credit back and get a car to assist him meeting his needs instead of taking the bus      Other   . Patient Stated     Get more motivated to increase my exercise and improve my diet. I will walk 2-3  times weekly. I will decrease the portions of what I eat and monitor how much carbohydrates, fat and sugar I eat.        This is a list of the screening recommended for you and due dates:  Health Maintenance  Topic Date Due  . Eye exam for diabetics  11/09/2017  . Hemoglobin A1C  09/30/2018  . Flu Shot  10/04/2018  . Pneumonia vaccines (2 of 2 - PPSV23) 12/03/2018  . Urine Protein Check  12/13/2018  . Complete foot exam   05/07/2019  . Tetanus Vaccine  03/05/2020  . Cologuard (Stool DNA test)  07/05/2020  .  Hepatitis C: One time screening is recommended by Center for Disease Control  (CDC) for  adults born from [birth year] through 1964.   Completed    Preventive Care 4 Years and  Older, Male Preventive care refers to lifestyle choices and visits with your health care provider that can promote health and wellness. What does preventive care include?   A yearly physical exam. This is also called an annual well check.  Dental exams once or twice a year.  Routine eye exams. Ask your health care provider how often you should have your eyes checked.  Personal lifestyle choices, including: ? Daily care of your teeth and gums. ? Regular physical activity. ? Eating a healthy diet. ? Avoiding tobacco and drug use. ? Limiting alcohol use. ? Practicing safe sex. ? Taking low doses of aspirin every day. ? Taking vitamin and mineral supplements as recommended by your health care provider. What happens during an annual well check? The services and screenings done by your health care provider during your annual well check will depend on your age, overall health, lifestyle risk factors, and family history of disease. Counseling Your health care provider may ask you questions about your:  Alcohol use.  Tobacco use.  Drug use.  Emotional well-being.  Home and relationship well-being.  Sexual activity.  Eating habits.  History of falls.  Memory and ability to understand (cognition).  Work and work Statistician. Screening You may have the following tests or measurements:  Height, weight, and BMI.  Blood pressure.  Lipid and cholesterol levels. These may be checked every 5 years, or more frequently if you are over 66 years old.  Skin check.  Lung cancer screening. You may have this screening every year starting at age 17 if you have a 30-pack-year history of smoking and currently smoke or have quit within the past 15 years.  Colorectal cancer screening. All adults should have this screening starting at age 67 and continuing until age 36. You will have tests every 1-10 years, depending on your results and the type of screening test. People at increased risk  should start screening at an earlier age. Screening tests may include: ? Guaiac-based fecal occult blood testing. ? Fecal immunochemical test (FIT). ? Stool DNA test. ? Virtual colonoscopy. ? Sigmoidoscopy. During this test, a flexible tube with a tiny camera (sigmoidoscope) is used to examine your rectum and lower colon. The sigmoidoscope is inserted through your anus into your rectum and lower colon. ? Colonoscopy. During this test, a long, thin, flexible tube with a tiny camera (colonoscope) is used to examine your entire colon and rectum.  Prostate cancer screening. Recommendations will vary depending on your family history and other risks.  Hepatitis C blood test.  Hepatitis B blood test.  Sexually transmitted disease (STD) testing.  Diabetes screening. This is done by checking your blood sugar (glucose) after you have not eaten for a while (fasting). You may have this done every 1-3 years.  Abdominal aortic aneurysm (AAA) screening. You may need this if you are a current or former smoker.  Osteoporosis. You may be screened starting at age 44 if you are at high risk. Talk with your health care provider about your test results, treatment options, and if necessary, the need for more tests. Vaccines Your health care provider may recommend certain vaccines, such as:  Influenza vaccine. This is recommended every year.  Tetanus, diphtheria, and acellular pertussis (Tdap, Td) vaccine. You may need a Td booster every 10 years.  Varicella vaccine. You may need this if you have not been vaccinated.  Zoster vaccine. You may need this after age 14.  Measles, mumps, and rubella (MMR) vaccine. You may need at least one dose of MMR if you were born in 1957 or later. You may also need a second dose.  Pneumococcal 13-valent conjugate (PCV13) vaccine. One dose is recommended after age 64.  Pneumococcal polysaccharide (PPSV23) vaccine. One dose is recommended after age 16.  Meningococcal  vaccine. You may need this if you have certain conditions.  Hepatitis A vaccine. You may need this if you have certain conditions or if you travel or work in places where you may be exposed to hepatitis A.  Hepatitis B vaccine. You may need this if you have certain conditions or if you travel or work in places where you may be exposed to hepatitis B.  Haemophilus influenzae type b (Hib) vaccine. You may need this if you have certain risk factors. Talk to your health care provider about which screenings and vaccines you need and how often you need them. This information is not intended to replace advice given to you by your health care provider. Make sure you discuss any questions you have with your health care provider. Document Released: 03/18/2015 Document Revised: 04/11/2017 Document Reviewed: 12/21/2014 Elsevier Interactive Patient Education  2019 [employer].

## 2018-09-01 ENCOUNTER — Telehealth: Payer: Self-pay | Admitting: Internal Medicine

## 2018-09-01 NOTE — Telephone Encounter (Signed)
Pt had positive cologuard test done by PCP 07/2017, result in epic. Referral received for colon. Does pt need ov prior or can he be direct colon? Please advise.

## 2018-09-01 NOTE — Telephone Encounter (Signed)
Left message for patient to return call to schedule.

## 2018-09-01 NOTE — Telephone Encounter (Signed)
Direct colonoscopy for the [place] is fine.  Previsit will need to address his diabetic medications.  Obtain old colonoscopy report from Dr. Amedeo Plenty for my review (he will need colonoscopy here regardless of the results, but I would like to review).  Thanks

## 2018-09-01 NOTE — Telephone Encounter (Signed)
See note below from Dr. Henrene Pastor regarding colonoscopy.

## 2018-09-02 ENCOUNTER — Other Ambulatory Visit: Payer: Self-pay

## 2018-09-02 NOTE — Patient Outreach (Signed)
[facility]) Care Management  09/02/2018  [name] 07/21/1950 [id number]   TELEPHONE SCREENING Referral date: 08/29/18 Referral source: primary care provider Referral reason :assistance with diabetes management Insurance: [facility]  Telephone call to patient regarding primary care provider referral. HIPAA verified with patient.  RNCM introduced herself and explained reason for call.  RNCM discussed and offered [facility] care managements services.  Patient verbally agreed to services. Patients states it has been approximately 8 months since he has seen his primary MD. Patient states he saw Emelia Loron, RN at his primary MD office last on 08/29/18 and they discussed patient being referred for diabetes management.  Patient states he has been diabetic for approximately 20 years. He states his most recent A1c was 10.8.  He reports today's fasting blood sugar is 117.  Patient states his blood sugars have been as high as 230.   Patient states he also has COPD and hypertension. He states his COPD and hypertension are being managed well on his medications. Patient states he is concerned about his weight.  He states he is 5 ft 4 in with a most recent weight of 202 lbs.    PLAN: RNCM will refer patient to health coach for diabetes management  Quinn Plowman RN,BSN,CCM [facility] Telephonic  [phone]

## 2018-09-03 ENCOUNTER — Other Ambulatory Visit: Payer: Self-pay | Admitting: *Deleted

## 2018-09-15 ENCOUNTER — Other Ambulatory Visit: Payer: Self-pay | Admitting: *Deleted

## 2018-09-17 ENCOUNTER — Ambulatory Visit: Payer: Medicare HMO | Admitting: Endocrinology

## 2018-09-18 ENCOUNTER — Other Ambulatory Visit: Payer: Self-pay | Admitting: *Deleted

## 2018-09-18 NOTE — Patient Outreach (Signed)
[facility]) Care Management  09/18/2018  TYLLER BOWLBY 1950/09/07 [id number]   RN Health Coach received telephone call from patient.  Hipaa compliance verified. Per patient he was out mowing the yard yesterday and got real weak. Patient had stated he had drank 2 bottles of water but he just felt washed out like his body was shutting down.Patient is not having any shortness of breath or chest pain. Patient stated he had a boost and egg sandwich yesterday a few hours before mowing and also had taken his insulin. RN asked the patient did he check his blood sugar when he started feeling bad and patient stated no.  Per patient he did not check his blood sugar until this morning and it was 103. Patient blood sugars usually run in the high 100's and 200 in the morning.  Per patient he just went in the house and went to bed.  RN told patient he can call his DR and let her know how he is feeling. If he feels worse he can go to ED if needed. RN discussed with patient about the signs and symptoms of hypo glycemia. RN explained to patient when he needs to check his blood sugars Ac and Hs and if he is feeling bad do a spot check. RN discussed the patient eating routine.   Plan: Patient will make the Dr aware of symptoms Patient will start checking and keeping a record of blood sugars Patient will eat meals on regular basis Patient will go to ER if symptoms worsen  [place] Management [phone]  .

## 2018-10-03 ENCOUNTER — Other Ambulatory Visit: Payer: Self-pay

## 2018-10-03 NOTE — Patient Outreach (Signed)
[facility]) Care Management  10/03/2018  CODEE TUTSON 12/12/1950 [id number]   Social work referral received from [facility], [employer].  "This patient is wanting to go to get a colonoscopy but has no one to go with him. He stated he has no friends or family that will go with him. I suggested that he gets an uber to take him and come back to get him. He is wanting to talk to the social worker about [place] transportation. Or may even suggest a cab for the quicker service" Unsuccessful outreach to patient today.  Left voicemail message and mailed unsuccessful outreach letter. Will attempt to reach again within four business days.  [name], BSW Social Worker [phone]

## 2018-10-03 NOTE — Patient Outreach (Addendum)
[facility]) Care Management  09/15/2018 Late entry  [name] 1950/11/23 [id number]  RN Health Coach telephone call to patient.  Hipaa compliance verified. Per patient his FBS is 249. His A1C is 10.8. Patient stated he has never had low blood sugar that he knows of. Per patient he is suppose to check his blood sugar four times a day. Patient checks his blood sugars 1-2 times a day. Patient has not been rotating his injection sites and stated that the places are hard and lumpy where he gives his injections. Per patient he has the living will and advance directive paper work. He just has to get it filled out. Patient stated that he is to have a colonoscopy but he doesn't have friends or anyone to take him or come back and pick him up. Per patient he eats a lot of popcorn but doesn't eat much sugar. Patient has agreed to follow up outreach calls.    Current Medications:  Current Outpatient Medications  Medication Sig Dispense Refill  . ACCU-CHEK GUIDE test strip USE TO MONITOR GLUCOSE LEVELS 4 TIMES PER DAY AS DIRECTED 100 each 2  . albuterol (VENTOLIN HFA) 108 (90 Base) MCG/ACT inhaler Inhale 1-2 puffs into the lungs every 4 (four) hours as needed for wheezing or shortness of breath. 54 each 3  . Alcohol Swabs (B-D SINGLE USE SWABS REGULAR) PADS Use to swab finger 3 times daily. 300 each 0  . ANORO ELLIPTA 62.5-25 MCG/INH AEPB INHALE 1 PUFF EVERY DAY 180 each 0  . aspirin EC 81 MG tablet Take 1 tablet (81 mg total) by mouth daily. 30 tablet 3  . Blood Glucose Monitoring Suppl (ACCU-CHEK GUIDE ME) w/Device KIT USE ONCE DAILY AS NEEDED 1 kit 0  . cholecalciferol (VITAMIN D) 1000 UNITS tablet Take 1 tablet (1,000 Units total) by mouth daily. (Patient taking differently: Take 1,000 Units by mouth 2 (two) times daily. ) 30 tablet 0  . Cinnamon 500 MG capsule Take 500 mg by mouth 2 (two) times daily.    . famotidine (PEPCID) 40 MG tablet Take 1 tablet (40 mg total) by mouth daily.  90 tablet 3  . hydrochlorothiazide (HYDRODIURIL) 25 MG tablet Take 1 tablet by mouth daily 90 tablet 2  . insulin NPH Human (NOVOLIN N) 100 UNIT/ML injection Inject 1.1 mLs (110 Units total) into the skin every morning. And syringes 2/day 30 mL 11  . Lancets (ACCU-CHEK SOFT TOUCH) lancets Use to check your blood sugars 4 times per day, once before each meal and once at bedtime; E11.9 100 each 12  . Multiple Vitamins-Minerals (MULTIVITAMIN WITH MINERALS) tablet Take 1 tablet by mouth daily.    . Omega-3 Fatty Acids (FISH OIL) 1000 MG CAPS Take 2 capsules by mouth daily.    . sildenafil (REVATIO) 20 MG tablet TAKE 5 TABLETS BY MOUTH DAILY AS NEEDED 10 tablet 6  . sildenafil (VIAGRA) 100 MG tablet Take 0.5-1 tablets (50-100 mg total) by mouth daily as needed for erectile dysfunction. 5 tablet 3  . traZODone (DESYREL) 50 MG tablet TAKE 1 TABLET AT BEDTIME AS NEEDED FOR SLEEP 90 tablet 0   No current facility-administered medications for this visit.     Functional Status:  In your present state of health, do you have any difficulty performing the following activities: 09/15/2018 08/29/2018  Hearing? N N  Vision? N N  Difficulty concentrating or making decisions? N N  Walking or climbing stairs? N N  Dressing or bathing? N N  Doing errands, shopping? N N  Preparing Food and eating ? N N  Using the Toilet? N N  In the past six months, have you accidently leaked urine? N N  Do you have problems with loss of bowel control? N N  Managing your Medications? N N  Managing your Finances? N N  Housekeeping or managing your Housekeeping? - N  Some recent data might be hidden    Fall/Depression Screening: Fall Risk  09/15/2018 08/29/2018 08/23/2017  Falls in the past year? 0 0 No  Number falls in past yr: - 0 -  Follow up Falls evaluation completed - -   PHQ 2/9 Scores 09/15/2018 09/02/2018 08/29/2018 08/23/2017 06/24/2015 05/10/2015 03/16/2015  PHQ - 2 Score 0 0 1 1 0 0 0  PHQ- 9 Score - - - 2 - - -    THN CM Care Plan Problem One     Most Recent Value  Care Plan Problem One  Knowledge Deficit in self management of Diabetes  Role Documenting the Problem One  Health Coach  Care Plan for Problem One  Active  THN Long Term Goal   Patient will  verbalize a decrease in A1C from 10.8 within the next 90 days  THN Long Term Goal Start Date  09/15/18  Interventions for Problem One Long Term Goal  RN discussed what the patient A1C is and what the FBS. RN discussed the range of 80-130 fasting blood sugar is our goal. RN sent aLiving well with diabetes. RN will follow up with further discussion  THN CM Short Term Goal #1   Patient will be able to verbalize hypo and hyperglycemia symptoms within the next 30  days  THN CM Short Term Goal #1 Start Date  09/15/18  Interventions for Short Term Goal #1  RN discussed the signs of hypo and hyperglycemia. RN sent a picture sheet on hypo and hyperglycemia. RN will follow up with further discussion  THN CM Short Term Goal #2   Patient will verbalize understanding of how and why you rotate insulin injection sites within the next 30 days  THN CM Short Term Goal #2 Start Date  09/15/18  Interventions for Short Term Goal #2  RN discussed with patient about rotating sites. RN sent a picture sheet of sites patient can rotate for self insulin shots. RN will follow up with further discussion.     Assessment:  FBS 249 A1C 10.8 RN discussed medication adherence Patient checks blood sugars 1-2 x day. Per Dr order check 4x day Patient needs to rotates injection sites areas are lumpy Patient will benefit from Health Coach telephonic outreach for education and support for diabetes self management. Plan:  RN discussed A1C and FBS RN sent Living Well with Diabetes Booklet RN discussed hypo and hyperglycemia RN sent a picture sheet of hypo and hyperglycemia symptoms and action plan RN sent EMMI education Why check your blood sugar RN sent EMMI education Why get you A1C  checked RN sent EMMI education Rotating injection sites for insulin RN sent barriers letter and assessment to PCP RN sent [facility] booklet for documentation Referred to Social worker RN will follow up outreach within the month of September   [name] BSN RN [facility] Care Management [phone]

## 2018-10-04 ENCOUNTER — Telehealth: Payer: Self-pay | Admitting: Internal Medicine

## 2018-10-07 ENCOUNTER — Encounter: Payer: Self-pay | Admitting: Internal Medicine

## 2018-10-07 ENCOUNTER — Ambulatory Visit: Payer: Self-pay

## 2018-10-07 ENCOUNTER — Other Ambulatory Visit: Payer: Self-pay

## 2018-10-07 NOTE — Patient Outreach (Signed)
[facility]) Care Management  10/07/2018  [name] Oct 08, 1950 [id number]   Social work referral received from [facility], [employer].  "This patient is wanting to go to get a colonoscopy but has no one to go with him. He stated he has no friends or family that will go with him. I suggested that he gets an uber to take him and come back to get him. He is wanting to talk to the social worker about [place] transportation. Or may even suggest a cab for the quicker service" Second unsuccessful outreach to patient today.  Left voicemail message.  Mailed unsuccessful outreach letter on 10/03/18. Will attempt to reach again within four business days.  [name], BSW Social Worker [phone]

## 2018-10-07 NOTE — Progress Notes (Signed)
Abstracted and sent to scan  

## 2018-10-08 ENCOUNTER — Ambulatory Visit: Payer: Self-pay

## 2018-10-08 ENCOUNTER — Other Ambulatory Visit: Payer: Self-pay

## 2018-10-08 NOTE — Patient Outreach (Signed)
[facility]) Care Management  10/08/2018  SENDER [name] April 08, 1950 [id number]   Social work referral received from [facility], [employer]. "This patient is wanting to go to get a colonoscopy but has no one to go with him. He stated he has no friends or family that will go with him. I suggested that he gets an uber to take him and come back to get him. He is wanting to talk to the social worker about [place] transportation. Or may even suggest a cab for the quicker service" Third unsuccessful outreach to patient today. Left voicemail message.  Mailed unsuccessful outreach letter on 10/03/18.  BSW can discuss transportation resources with patient if response is received from voicemail messages or letter. Unfortunately, BSW will not be able to offer resource for individual to accompany him to appointment.  BSW will close case if no response by   [name], [place] Worker [phone]

## 2018-10-10 MED ORDER — FAMOTIDINE 40 MG PO TABS: 40.0000 mg | ORAL_TABLET | Freq: Every day | ORAL | 0 refills | Status: DC

## 2018-10-10 NOTE — Addendum Note (Signed)
Addended by: Pollyann Glen on: 10/10/2018 09:14 AM   Modules accepted: Orders

## 2018-10-10 NOTE — Telephone Encounter (Signed)
Patient calling to get the status of his medication.  Pharmacy said they do not have the medication.  Please advise

## 2018-10-10 NOTE — Telephone Encounter (Signed)
Resent medication

## 2018-10-16 ENCOUNTER — Other Ambulatory Visit: Payer: Self-pay

## 2018-10-16 ENCOUNTER — Ambulatory Visit: Payer: Medicare HMO | Admitting: *Deleted

## 2018-10-16 ENCOUNTER — Other Ambulatory Visit: Payer: Self-pay | Admitting: Internal Medicine

## 2018-10-16 DIAGNOSIS — J4521 Mild intermittent asthma with (acute) exacerbation: Secondary | ICD-10-CM

## 2018-10-16 NOTE — Patient Outreach (Signed)
[place] [facility] [facility]) Care Management  10/16/2018  ULICES MAACK [birth year] [id number]   [facility] BSW closing case due to inability to contact.  [name], BSW Social Worker [phone]

## 2018-10-24 ENCOUNTER — Ambulatory Visit: Payer: Medicare HMO | Admitting: Endocrinology

## 2018-11-06 ENCOUNTER — Other Ambulatory Visit (INDEPENDENT_AMBULATORY_CARE_PROVIDER_SITE_OTHER): Payer: Medicare HMO

## 2018-11-06 ENCOUNTER — Other Ambulatory Visit: Payer: Self-pay

## 2018-11-06 ENCOUNTER — Encounter: Payer: Self-pay | Admitting: Internal Medicine

## 2018-11-06 ENCOUNTER — Ambulatory Visit (INDEPENDENT_AMBULATORY_CARE_PROVIDER_SITE_OTHER): Payer: Medicare HMO | Admitting: Internal Medicine

## 2018-11-06 VITALS — BP 140/90 | HR 104 | Temp 98.3°F | Ht 64.0 in | Wt 198.0 lb

## 2018-11-06 DIAGNOSIS — E785 Hyperlipidemia, unspecified: Secondary | ICD-10-CM

## 2018-11-06 DIAGNOSIS — Z23 Encounter for immunization: Secondary | ICD-10-CM | POA: Diagnosis not present

## 2018-11-06 DIAGNOSIS — F5101 Primary insomnia: Secondary | ICD-10-CM | POA: Diagnosis not present

## 2018-11-06 DIAGNOSIS — E1169 Type 2 diabetes mellitus with other specified complication: Secondary | ICD-10-CM | POA: Diagnosis not present

## 2018-11-06 DIAGNOSIS — I10 Essential (primary) hypertension: Secondary | ICD-10-CM

## 2018-11-06 DIAGNOSIS — Z794 Long term (current) use of insulin: Secondary | ICD-10-CM

## 2018-11-06 DIAGNOSIS — K219 Gastro-esophageal reflux disease without esophagitis: Secondary | ICD-10-CM

## 2018-11-06 DIAGNOSIS — Z0000 Encounter for general adult medical examination without abnormal findings: Secondary | ICD-10-CM

## 2018-11-06 DIAGNOSIS — E119 Type 2 diabetes mellitus without complications: Secondary | ICD-10-CM

## 2018-11-06 DIAGNOSIS — G4700 Insomnia, unspecified: Secondary | ICD-10-CM | POA: Insufficient documentation

## 2018-11-06 LAB — COMPREHENSIVE METABOLIC PANEL
ALT: 42 U/L (ref 0–53)
AST: 33 U/L (ref 0–37)
Albumin: 4.4 g/dL (ref 3.5–5.2)
Alkaline Phosphatase: 65 U/L (ref 39–117)
BUN: 21 mg/dL (ref 6–23)
CO2: 25 mEq/L (ref 19–32)
Calcium: 9.9 mg/dL (ref 8.4–10.5)
Chloride: 100 mEq/L (ref 96–112)
Creatinine, Ser: 1.23 mg/dL (ref 0.40–1.50)
GFR: 70.98 mL/min (ref >60.00)
Glucose, Bld: 287 mg/dL — ABNORMAL HIGH (ref 70–99)
Potassium: 3.7 mEq/L (ref 3.5–5.1)
Sodium: 135 mEq/L (ref 135–145)
Total Bilirubin: 0.4 mg/dL (ref 0.2–1.2)
Total Protein: 8 g/dL (ref 6.0–8.3)

## 2018-11-06 LAB — MICROALBUMIN / CREATININE URINE RATIO
Creatinine,U: 67.3 mg/dL
Microalb Creat Ratio: 1.5 mg/g (ref 0.0–30.0)
Microalb, Ur: 1 mg/dL (ref 0.0–1.9)

## 2018-11-06 LAB — CBC
HCT: 46 % (ref 39.0–52.0)
Hemoglobin: 15.3 g/dL (ref 13.0–17.0)
MCHC: 33.3 g/dL (ref 30.0–36.0)
MCV: 83.2 fl (ref 78.0–100.0)
Platelets: 348 10*3/uL (ref 150.0–400.0)
RBC: 5.53 Mil/uL (ref 4.22–5.81)
RDW: 14.3 % (ref 11.5–15.5)
WBC: 6.6 10*3/uL (ref 4.0–10.5)

## 2018-11-06 LAB — LIPID PANEL
Cholesterol: 245 mg/dL — ABNORMAL HIGH (ref 0–200)
HDL: 43 mg/dL (ref >39.00)
NonHDL: 202.42
Total CHOL/HDL Ratio: 6
Triglycerides: 235 mg/dL — ABNORMAL HIGH (ref 0.0–149.0)
VLDL: 47 mg/dL — ABNORMAL HIGH (ref 0.0–40.0)

## 2018-11-06 LAB — HEMOGLOBIN A1C: Hgb A1c MFr Bld: 10.6 % — ABNORMAL HIGH (ref 4.6–6.5)

## 2018-11-06 LAB — LDL CHOLESTEROL, DIRECT: Direct LDL: 194 mg/dL

## 2018-11-06 MED ORDER — TRAZODONE HCL 50 MG PO TABS: 50.0000 mg | ORAL_TABLET | Freq: Every evening | ORAL | 3 refills | Status: DC | PRN

## 2018-11-06 MED ORDER — LISINOPRIL-HYDROCHLOROTHIAZIDE 20-25 MG PO TABS: 1.0000 | ORAL_TABLET | Freq: Every day | ORAL | 3 refills | Status: DC

## 2018-11-06 MED ORDER — FAMOTIDINE 40 MG PO TABS: 40.0000 mg | ORAL_TABLET | Freq: Every day | ORAL | 3 refills | Status: DC

## 2018-11-06 NOTE — Assessment & Plan Note (Signed)
BP above goal consistently lately so changing BP medication to lisinopril/hctz 20/25 mg daily. Checking labs today and adjust as needed.

## 2018-11-06 NOTE — Assessment & Plan Note (Signed)
Slightly worse lately with stress. Refill trazodone.

## 2018-11-06 NOTE — Assessment & Plan Note (Signed)
Checking labs today including HgA1c and microalbumin to creatinine ratio. Starting ACE-I today. Not on statin due to intolerance. Seeing endo at end of month for adjustment of meds.

## 2018-11-06 NOTE — Progress Notes (Signed)
   Subjective:   Patient ID: [name], male    DOB: 1950/09/13, 68 y.o.   MRN: [id number]  HPI The patient is a 68 YO man coming in for physical.   PMH, [place], social history reviewed and updated  Review of Systems  Constitutional: Positive for activity change. Negative for appetite change, fatigue, fever and unexpected weight change.  HENT: Negative.   Eyes: Negative.   Respiratory: Negative for cough, chest tightness and shortness of breath.   Cardiovascular: Negative for chest pain, palpitations and leg swelling.  Gastrointestinal: Positive for abdominal pain. Negative for abdominal distention, anal bleeding, blood in stool, constipation, diarrhea, nausea and vomiting.       GERD, out of pepcid  Musculoskeletal: Negative.   Skin: Negative.   Neurological: Negative.   Psychiatric/Behavioral: Positive for decreased concentration and dysphoric mood.    Objective:  Physical Exam Constitutional:      Appearance: He is well-developed. He is obese.  HENT:     Head: Normocephalic and atraumatic.  Neck:     Musculoskeletal: Normal range of motion.  Cardiovascular:     Rate and Rhythm: Normal rate and regular rhythm.  Pulmonary:     Effort: Pulmonary effort is normal. No respiratory distress.     Breath sounds: Normal breath sounds. No wheezing or rales.  Abdominal:     General: Bowel sounds are normal. There is no distension.     Palpations: Abdomen is soft.     Tenderness: There is no abdominal tenderness. There is no rebound.  Skin:    General: Skin is warm and dry.  Neurological:     Mental Status: He is alert and oriented to person, place, and time.     Coordination: Coordination normal.     Vitals:   11/06/18 1308  BP: (!) 170/80  Pulse: (!) 104  Temp: 98.3 F (36.8 C)  TempSrc: Oral  SpO2: 98%  Weight: 198 lb (89.8 kg)  Height: 5\' 4"  (1.626 m)    Assessment & Plan:  Flu shot given at visit

## 2018-11-06 NOTE — Assessment & Plan Note (Signed)
With intolerance to statins and unwilling to try again at this time. Checking lipid panel and if above goal consideration for pitavastatin.

## 2018-11-06 NOTE — Assessment & Plan Note (Signed)
Flu shot given. Pneumonia complete. Shingrix counseled. Tetanus due 2022. Colonoscopy due but he does not have ride so cannot get scheduled at this time. Counseled about sun safety and mole surveillance. Counseled about the dangers of distracted driving. Given 10 year screening recommendations.

## 2018-11-06 NOTE — Assessment & Plan Note (Signed)
Refill pepcid

## 2018-11-06 NOTE — Patient Instructions (Addendum)
We have sent in lisinopril/hctz to take 1 pill daily for blood pressure instead of the hctz you take daily now to keep the blood pressure more level.  Health Maintenance, Male Adopting a healthy lifestyle and getting preventive care are important in promoting health and wellness. Ask your health care provider about:  The right schedule for you to have regular tests and exams.  Things you can do on your own to prevent diseases and keep yourself healthy. What should I know about diet, weight, and exercise? Eat a healthy diet   Eat a diet that includes plenty of vegetables, fruits, low-fat dairy products, and lean protein.  Do not eat a lot of foods that are high in solid fats, added sugars, or sodium. Maintain a healthy weight Body mass index (BMI) is a measurement that can be used to identify possible weight problems. It estimates body fat based on height and weight. Your health care provider can help determine your BMI and help you achieve or maintain a healthy weight. Get regular exercise Get regular exercise. This is one of the most important things you can do for your health. Most adults should:  Exercise for at least 150 minutes each week. The exercise should increase your heart rate and make you sweat (moderate-intensity exercise).  Do strengthening exercises at least twice a week. This is in addition to the moderate-intensity exercise.  Spend less time sitting. Even light physical activity can be beneficial. Watch cholesterol and blood lipids Have your blood tested for lipids and cholesterol at 67 years of age, then have this test every 5 years. You may need to have your cholesterol levels checked more often if:  Your lipid or cholesterol levels are high.  You are older than 67 years of age.  You are at high risk for heart disease. What should I know about cancer screening? Many types of cancers can be detected early and may often be prevented. Depending on your health  history and family history, you may need to have cancer screening at various ages. This may include screening for:  Colorectal cancer.  Prostate cancer.  Skin cancer.  Lung cancer. What should I know about heart disease, diabetes, and high blood pressure? Blood pressure and heart disease  High blood pressure causes heart disease and increases the risk of stroke. This is more likely to develop in people who have high blood pressure readings, are of African descent, or are overweight.  Talk with your health care provider about your target blood pressure readings.  Have your blood pressure checked: ? Every 3-5 years if you are 35-17 years of age. ? Every year if you are 61 years old or older.  If you are between the ages of 60 and 3 and are a current or former smoker, ask your health care provider if you should have a one-time screening for abdominal aortic aneurysm (AAA). Diabetes Have regular diabetes screenings. This checks your fasting blood sugar level. Have the screening done:  Once every three years after age 37 if you are at a normal weight and have a low risk for diabetes.  More often and at a younger age if you are overweight or have a high risk for diabetes. What should I know about preventing infection? Hepatitis B If you have a higher risk for hepatitis B, you should be screened for this virus. Talk with your health care provider to find out if you are at risk for hepatitis B infection. Hepatitis C Blood testing is  recommended for:  Everyone born from [birth year] through 1964.  Anyone with known risk factors for hepatitis C. Sexually transmitted infections (STIs)  You should be screened each year for STIs, including gonorrhea and chlamydia, if: ? You are sexually active and are younger than 67 years of age. ? You are older than 67 years of age and your health care provider tells you that you are at risk for this type of infection. ? Your sexual activity has changed since  you were last screened, and you are at increased risk for chlamydia or gonorrhea. Ask your health care provider if you are at risk.  Ask your health care provider about whether you are at high risk for HIV. Your health care provider may recommend a prescription medicine to help prevent HIV infection. If you choose to take medicine to prevent HIV, you should first get tested for HIV. You should then be tested every 3 months for as long as you are taking the medicine. Follow these instructions at home: Lifestyle  Do not use any products that contain nicotine or tobacco, such as cigarettes, e-cigarettes, and chewing tobacco. If you need help quitting, ask your health care provider.  Do not use street drugs.  Do not share needles.  Ask your health care provider for help if you need support or information about quitting drugs. Alcohol use  Do not drink alcohol if your health care provider tells you not to drink.  If you drink alcohol: ? Limit how much you have to 0-2 drinks a day. ? Be aware of how much alcohol is in your drink. In the [place] one drink equals one 12 oz bottle of beer (355 mL), one 5 oz glass of wine (148 mL), or one 1 oz glass of hard liquor (44 mL). General instructions  Schedule regular health, dental, and eye exams.  Stay current with your vaccines.  Tell your health care provider if: ? You often feel depressed. ? You have ever been abused or do not feel safe at home. Summary  Adopting a healthy lifestyle and getting preventive care are important in promoting health and wellness.  Follow your health care provider's instructions about healthy diet, exercising, and getting tested or screened for diseases.  Follow your health care provider's instructions on monitoring your cholesterol and blood pressure. This information is not intended to replace advice given to you by your health care provider. Make sure you discuss any questions you have with your health care  provider. Document Released: 08/18/2007 Document Revised: 02/12/2018 Document Reviewed: 02/12/2018 Elsevier Patient Education  2020 [employer].

## 2018-11-20 ENCOUNTER — Other Ambulatory Visit: Payer: Self-pay | Admitting: *Deleted

## 2018-11-20 NOTE — Patient Outreach (Signed)
[facility]) Care Management  11/20/2018   [name] Dec 29, 1950 [id number]  RN Health Coach attempted follow up outreach call to patient.  Patient was unavailable. HIPPA compliance voicemail message left with return callback number.  RN Health Coach received return telephone call from patient.  Hipaa compliance verified. Per patient his A1C is 10.6 from 10.8. Per patient he loves eating popcorn. Patient stated that he eats a whole lot of popcorn. RN discussed with patient about eating healthy snacks. RN discussed with the high content of butter and salt makes his cholesterol levels go up. Patient has received his flu shot. Patient has agreed to follow up outreach calls.    Current Medications:  Current Outpatient Medications  Medication Sig Dispense Refill  . ACCU-CHEK GUIDE test strip USE TO MONITOR GLUCOSE LEVELS 4 TIMES PER DAY AS DIRECTED 100 each 2  . albuterol (VENTOLIN HFA) 108 (90 Base) MCG/ACT inhaler Inhale 1-2 puffs into the lungs every 4 (four) hours as needed for wheezing or shortness of breath. 54 each 3  . Alcohol Swabs (B-D SINGLE USE SWABS REGULAR) PADS Use to swab finger 3 times daily. 300 each 0  . aspirin EC 81 MG tablet Take 1 tablet (81 mg total) by mouth daily. 30 tablet 3  . Blood Glucose Monitoring Suppl (ACCU-CHEK GUIDE ME) w/Device KIT USE ONCE DAILY AS NEEDED 1 kit 0  . cholecalciferol (VITAMIN D) 1000 UNITS tablet Take 1 tablet (1,000 Units total) by mouth daily. (Patient taking differently: Take 1,000 Units by mouth 2 (two) times daily. ) 30 tablet 0  . Cinnamon 500 MG capsule Take 500 mg by mouth 2 (two) times daily.    . famotidine (PEPCID) 40 MG tablet Take 1 tablet (40 mg total) by mouth daily. 90 tablet 3  . insulin NPH Human (NOVOLIN N) 100 UNIT/ML injection Inject 1.1 mLs (110 Units total) into the skin every morning. And syringes 2/day 30 mL 11  . Lancets (ACCU-CHEK SOFT TOUCH) lancets Use to check your blood sugars 4 times per day,  once before each meal and once at bedtime; E11.9 100 each 12  . lisinopril-hydrochlorothiazide (ZESTORETIC) 20-25 MG tablet Take 1 tablet by mouth daily. 90 tablet 3  . Multiple Vitamins-Minerals (MULTIVITAMIN WITH MINERALS) tablet Take 1 tablet by mouth daily.    . Omega-3 Fatty Acids (FISH OIL) 1000 MG CAPS Take 2 capsules by mouth daily.    . sildenafil (REVATIO) 20 MG tablet TAKE 5 TABLETS BY MOUTH DAILY AS NEEDED 10 tablet 6  . sildenafil (VIAGRA) 100 MG tablet Take 0.5-1 tablets (50-100 mg total) by mouth daily as needed for erectile dysfunction. 5 tablet 3  . traZODone (DESYREL) 50 MG tablet Take 1 tablet (50 mg total) by mouth at bedtime as needed for sleep. 90 tablet 3  . umeclidinium-vilanterol (ANORO ELLIPTA) 62.5-25 MCG/INH AEPB Inhale 1 puff into the lungs daily. NEED OFFICE VISIT FOR FURTHER REFILLS 180 each 0   No current facility-administered medications for this visit.     Functional Status:  In your present state of health, do you have any difficulty performing the following activities: 11/20/2018 09/15/2018  Hearing? N N  Vision? N N  Difficulty concentrating or making decisions? N N  Walking or climbing stairs? N N  Dressing or bathing? N N  Doing errands, shopping? N N  Preparing Food and eating ? N N  Using the Toilet? N N  In the past six months, have you accidently leaked urine? N N  Do you have problems with loss of bowel control? N N  Managing your Medications? N N  Managing your Finances? N N  Housekeeping or managing your Housekeeping? N -  Some recent data might be hidden    Fall/Depression Screening: Fall Risk  09/15/2018 08/29/2018 08/23/2017  Falls in the past year? 0 0 No  Number falls in past yr: - 0 -  Follow up Falls evaluation completed - -   PHQ 2/9 Scores 09/15/2018 09/02/2018 08/29/2018 08/23/2017 06/24/2015 05/10/2015 03/16/2015  PHQ - 2 Score 0 0 1 1 0 0 0  PHQ- 9 Score - - - 2 - - -   THN CM Care Plan Problem One     Most Recent Value  Care Plan  Problem One  Knowledge Deficit in self management of Diabetes  Role Documenting the Problem One  Health Coach  Care Plan for Problem One  Active  THN Long Term Goal   Patient will  verbalize a decrease in A1C from 10.8 within the next 90 days  Interventions for Problem One Long Term Goal  RN disucssed the slight decrease in the A1C to 10.6 from 10.8. RN disussed with the patient what his A1C needs to be to show an improvement.. RN reiterated healthy eating.RN will follow up with further discussion  THN CM Short Term Goal #1   Patient will be able to verbalize hypo and hyperglycemia symptoms within the next 30  days  THN CM Short Term Goal #1 Met Date  11/20/18  [facility] CM Short Term Goal #2   Patient will verbalize understanding of how and why you rotate insulin injection sites within the next 30 days  Interventions for Short Term Goal #2  RN reiterated the need to rotate sites. RN will follow up for further discussion  THN CM Short Term Goal #3  Patient will verbalize eating healthy snacks within the next 30 days  THN CM Short Term Goal #3 Start Date  11/20/18  Interventions for Short Tern Goal #3  RN discussed with patient about healthy snacks. Not popcorn with heavy butter and salt.RN discussed the healthy food program that [employer] has going during the Covid time. RN sent a list of healthy snacks for patient to choose from. RN will follow up with further discussion.  THN CM Short Term Goal #4  Patient will verbalize making better food choices when dining out witin the next 30 days  THN CM Short Term Goal #4 Start Date  11/20/18  Interventions for Short Term Goal #4  RN disussed with patient about meking better food choices dining out. RN sent a list of fast food diabetic meal to choose from. TN will follow up with further discussion.       Assessment:  Patient is not adhering to his diet Patient A1C is 10.6 from 10.8 Fasting blood sugar 148 Patient cholesterol levels are elevated Patient  exercise is from mowing yards 2-3 times a week Patient received Flu shot [id number]  Plan:  RN discussed A1C RN discussed FBS Patient will be making an appointment with Dr Everardo All next month Patient will schedule foot exam RN discussed health maintenance RN discussed using the [facility] mail order catalog and getting a thermometer RN will follow up with further discussion in the month of November  [name] BSN RN [facility] Care Management [phone]

## 2018-11-21 ENCOUNTER — Telehealth: Payer: Self-pay | Admitting: Internal Medicine

## 2018-11-21 MED ORDER — ATORVASTATIN CALCIUM 40 MG PO TABS: 40.0000 mg | ORAL_TABLET | Freq: Every day | ORAL | 3 refills | Status: DC

## 2018-11-21 NOTE — Telephone Encounter (Signed)
Called patient and he stated not the diabetes medication he meant to say cholesterol medication. Stated that he got a call about cholesterol being high and he is willing to start on a medication. Would like it sent to [facility] on [address]

## 2018-11-21 NOTE — Telephone Encounter (Signed)
Patient is requesting call back from CMA to discuss diabetes medication that was supposed to be sent into pharmacy. See labs from 09/03

## 2018-11-21 NOTE — Telephone Encounter (Signed)
Was there supposed to be a diabetes medication sent for patient at his last visit?

## 2018-11-21 NOTE — Telephone Encounter (Signed)
Noted patient informed medication was sent in

## 2018-11-21 NOTE — Telephone Encounter (Signed)
Dr. Loanne Drilling adjusts his meds and he has visit with them on 11/26/18. We did labs so that they could discuss and make changes then.

## 2018-11-21 NOTE — Telephone Encounter (Signed)
Sent in cholesterol medicine for him. If he has problems with this we may need to have him go to cholesterol clinic for an injection for the cholesterol.

## 2018-11-24 ENCOUNTER — Other Ambulatory Visit: Payer: Self-pay | Admitting: Internal Medicine

## 2018-11-26 ENCOUNTER — Ambulatory Visit: Payer: Medicare HMO | Admitting: Endocrinology

## 2018-12-19 ENCOUNTER — Ambulatory Visit: Payer: Medicare HMO | Admitting: Endocrinology

## 2018-12-31 ENCOUNTER — Other Ambulatory Visit: Payer: Self-pay | Admitting: Endocrinology

## 2018-12-31 DIAGNOSIS — E119 Type 2 diabetes mellitus without complications: Secondary | ICD-10-CM

## 2019-01-08 ENCOUNTER — Other Ambulatory Visit: Payer: Self-pay

## 2019-01-12 ENCOUNTER — Encounter: Payer: Self-pay | Admitting: Endocrinology

## 2019-01-12 ENCOUNTER — Ambulatory Visit (INDEPENDENT_AMBULATORY_CARE_PROVIDER_SITE_OTHER): Payer: Medicare HMO | Admitting: Endocrinology

## 2019-01-12 ENCOUNTER — Other Ambulatory Visit: Payer: Self-pay

## 2019-01-12 VITALS — BP 148/68 | HR 88 | Ht 64.0 in | Wt 203.8 lb

## 2019-01-12 DIAGNOSIS — Z794 Long term (current) use of insulin: Secondary | ICD-10-CM

## 2019-01-12 DIAGNOSIS — E119 Type 2 diabetes mellitus without complications: Secondary | ICD-10-CM | POA: Diagnosis not present

## 2019-01-12 LAB — POCT GLYCOSYLATED HEMOGLOBIN (HGB A1C): Hemoglobin A1C: 10.7 % — AB (ref 4.0–5.6)

## 2019-01-12 MED ORDER — INSULIN NPH (HUMAN) (ISOPHANE) 100 UNIT/ML ~~LOC~~ SUSP: 120.0000 [IU] | SUBCUTANEOUS | 11 refills | Status: DC

## 2019-01-12 NOTE — Patient Instructions (Addendum)
Your blood pressure is high today.  Please see your primary care provider soon, to have it rechecked check your blood sugar 4 times a day: before the 3 meals, and at bedtime.  also check if you have symptoms of your blood sugar being too high or too low.  please keep a record of the readings and bring it to your next appointment here (or you can bring the meter itself).  You can write it on any piece of paper.  please call us sooner if your blood sugar goes below 70, or if you have a lot of readings over 200. Please increase the NPH insulin to 120 units each morning.  You would draw up 60 units twice.   On this type of insulin schedule, you should eat meals on a regular schedule.  If a meal is missed or significantly delayed, your blood sugar could go low.   Please come back for a follow-up appointment in 2 weeks.  Please see Vaughan Basta the same day, to go over the injections.

## 2019-01-12 NOTE — Progress Notes (Signed)
Subjective:    Patient ID: [name], male    DOB: 02-06-1951, 68 y.o.   MRN: [id number]  HPI Pt returns for f/u of diabetes mellitus: DM type: Insulin-requiring type 2.  Dx'ed: 6578 Complications: PAD Therapy: insulin since 2016.   DKA: never Severe hypoglycemia: never.  Pancreatitis: never.   Other: he declines multiple daily injections; he is chronically off and on prednisone, for asthma; he changed lantus to NPH, due to pattern of cbg's; ins declines levemir.  Interval history: cbg varies from 140-200's.  It is in general higher as the day goes on.  No recent steroids.  He says he never misses the insulin.  He takes 100 units qam.  He says a vial of insulin lasts 1 month.   Past Medical History:  Diagnosis Date  . Alcoholism ([place])   . Arthritis    "knees, hands" (03/08/2015)  . Asthma   . Chronic bronchitis ([place])   . Chronic diastolic CHF (congestive heart failure) ([place])    notes 03/08/2015  . Cirrhosis ([place])   . COPD (chronic obstructive pulmonary disease) ([place])   . Coronary artery disease   . Hepatitis C   . High cholesterol   . Hypertension   . Pneumonia 03/2015  . Shortness of breath dyspnea   . Type II diabetes mellitus ([place])     Past Surgical History:  Procedure Laterality Date  . CATARACT EXTRACTION Left 02/2014  . ESOPHAGOGASTRODUODENOSCOPY  02/09/2011   Procedure: ESOPHAGOGASTRODUODENOSCOPY (EGD);  Surgeon: Missy Sabins, MD;  Location: [facility] ENDOSCOPY;  Service: Endoscopy;  Laterality: N/A;  . 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 Marital status: Divorced    Spouse name: Not on file  . Number of children: 4  . Years of education: Not on file  . Highest education level: Not on file  Occupational History  . Occupation: disabled  Social Needs  . Financial resource strain: Somewhat hard  . Food insecurity    Worry: Never true    Inability: Never true  . Transportation needs    Medical: No    Non-medical:  No  Tobacco Use  . Smoking status: Former Smoker    Packs/day: 1.50    Years: 46.00    Pack years: 69.00    Types: Cigarettes    Quit date: 03/19/2013    Years since quitting: 5.8  . Smokeless tobacco: Never Used  Substance and Sexual Activity  . Alcohol use: No    Alcohol/week: 0.0 standard drinks    Comment: "recovering alcoholic; 4/69/6295"  . Drug use: No  . Sexual activity: Not Currently  Lifestyle  . Physical activity    Days per week: 2 days    Minutes per session: 40 min  . Stress: Only a little  Relationships  . Social connections    Talks on phone: More than three times a week    Gets together: More than three times a week    Attends religious service: More than 4 times per year    Active member of club or organization: Yes    Attends meetings of clubs or organizations: More than 4 times per year    Relationship status: Divorced  . Intimate partner violence    Fear of current or ex partner: Not on file    Emotionally abused: Not on file    Physically abused: Not on file    Forced sexual activity: Not on file  Other Topics  Concern  . Not on file  Social History Narrative  . Not on file    Current Outpatient Medications on File Prior to Visit  Medication Sig Dispense Refill  . albuterol (VENTOLIN HFA) 108 (90 Base) MCG/ACT inhaler Inhale 1-2 puffs into the lungs every 4 (four) hours as needed for wheezing or shortness of breath. 54 each 3  . Alcohol Swabs (B-D SINGLE USE SWABS REGULAR) PADS Use to swab finger 3 times daily. 300 each 0  . aspirin EC 81 MG tablet Take 1 tablet (81 mg total) by mouth daily. 30 tablet 3  . atorvastatin (LIPITOR) 40 MG tablet Take 1 tablet (40 mg total) by mouth daily. 90 tablet 3  . Blood Glucose Monitoring Suppl (ACCU-CHEK GUIDE ME) w/Device KIT USE ONCE DAILY AS NEEDED 1 kit 0  . cholecalciferol (VITAMIN D) 1000 UNITS tablet Take 1 tablet (1,000 Units total) by mouth daily. (Patient taking differently: Take 1,000 Units by mouth 2  (two) times daily. ) 30 tablet 0  . Cinnamon 500 MG capsule Take 500 mg by mouth 2 (two) times daily.    . famotidine (PEPCID) 40 MG tablet Take 1 tablet (40 mg total) by mouth daily. 90 tablet 3  . glucose blood (ACCU-CHEK GUIDE) test strip 1 each by Other route 4 (four) times daily. 120 each 2  . Lancets (ACCU-CHEK SOFT TOUCH) lancets Use to check your blood sugars 4 times per day, once before each meal and once at bedtime; E11.9 100 each 12  . lisinopril-hydrochlorothiazide (ZESTORETIC) 20-25 MG tablet Take 1 tablet by mouth daily. 90 tablet 3  . Multiple Vitamins-Minerals (MULTIVITAMIN WITH MINERALS) tablet Take 1 tablet by mouth daily.    . Omega-3 Fatty Acids (FISH OIL) 1000 MG CAPS Take 2 capsules by mouth daily.    . sildenafil (REVATIO) 20 MG tablet TAKE 5 TABLETS BY MOUTH DAILY AS NEEDED 10 tablet 6  . sildenafil (VIAGRA) 100 MG tablet Take 0.5-1 tablets (50-100 mg total) by mouth daily as needed for erectile dysfunction. 5 tablet 3  . traZODone (DESYREL) 50 MG tablet Take 1 tablet (50 mg total) by mouth at bedtime as needed for sleep. 90 tablet 3  . umeclidinium-vilanterol (ANORO ELLIPTA) 62.5-25 MCG/INH AEPB Inhale 1 puff into the lungs daily. NEED OFFICE VISIT FOR FURTHER REFILLS 180 each 0   No current facility-administered medications on file prior to visit.     Allergies  Allergen Reactions  . Crestor [Rosuvastatin] Other (See Comments)    INTOLERANCE   . Metformin And Related Other (See Comments)    Stomach ache  . Mobic [Meloxicam] Rash    Family History  Problem Relation Age of Onset  . Stroke Mother   . Diabetes Mother   . Hypertension Mother   . Hyperlipidemia Mother   . Heart disease Father   . Hypertension Father   . Heart attack Father     BP (!) 148/68 (BP Location: Left Arm, Patient Position: Sitting, Cuff Size: Normal)   Pulse 88   Ht _0  (1.626 m)   Wt 203 lb 12.8 oz (92.4 kg)   SpO2 98%   BMI 34.98 kg/m    Review of Systems He denies  hypoglycemia.      Objective:   Physical Exam VITAL SIGNS:  See vs page GENERAL: no distress Pulses: dorsalis pedis intact bilat.   MSK: no deformity of the feet CV: no leg edema Skin:  no ulcer on the feet.  normal color and temp on the  feet. Neuro: sensation is intact to touch on the feet   Lab Results  Component Value Date   HGBA1C 10.7 (A) 01/12/2019       Assessment & Plan:  Insulin-requiring type 2 DM, with PAD: worse. He says 1 vial lasts 1 month, which raises question of medical compliance HTN: is noted today.   Patient Instructions  Your blood pressure is high today.  Please see your primary care provider soon, to have it rechecked check your blood sugar 4 times a day: before the 3 meals, and at bedtime.  also check if you have symptoms of your blood sugar being too high or too low.  please keep a record of the readings and bring it to your next appointment here (or you can bring the meter itself).  You can write it on any piece of paper.  please call us sooner if your blood sugar goes below 70, or if you have a lot of readings over 200. Please increase the NPH insulin to 120 units each morning.  You would draw up 60 units twice.   On this type of insulin schedule, you should eat meals on a regular schedule.  If a meal is missed or significantly delayed, your blood sugar could go low.   Please come back for a follow-up appointment in 2 weeks.  Please see Vaughan Basta the same day, to go over the injections.

## 2019-01-14 ENCOUNTER — Other Ambulatory Visit: Payer: Medicare HMO

## 2019-01-14 NOTE — Patient Outreach (Signed)
[name]) Care Management  01/14/2019  [name] 06/24/1950 [id number]   Telephone call to patient for disease management follow up. No answer. HIPAA compliant voice message left.    Plan: RN CM will attempt patient in the month of December and send letter.   [name], RN, MSN [place] Management Care Management Coordinator Direct Line [phone] Cell [phone] Toll Free: [phone]  Fax: [phone]

## 2019-01-20 ENCOUNTER — Ambulatory Visit: Payer: Medicare HMO

## 2019-01-20 ENCOUNTER — Emergency Department (HOSPITAL_COMMUNITY): Payer: Medicare HMO

## 2019-01-20 ENCOUNTER — Other Ambulatory Visit: Payer: Self-pay

## 2019-01-20 ENCOUNTER — Other Ambulatory Visit: Payer: Self-pay | Admitting: *Deleted

## 2019-01-20 ENCOUNTER — Emergency Department (HOSPITAL_COMMUNITY)
Admission: EM | Admit: 2019-01-20 | Discharge: 2019-01-20 | Disposition: A | Discharge status: Home / Self Care | Payer: Medicare HMO | Attending: Emergency Medicine | Admitting: Emergency Medicine

## 2019-01-20 ENCOUNTER — Ambulatory Visit: Payer: Medicare HMO | Admitting: *Deleted

## 2019-01-20 DIAGNOSIS — Z794 Long term (current) use of insulin: Secondary | ICD-10-CM | POA: Diagnosis not present

## 2019-01-20 DIAGNOSIS — I110 Hypertensive heart disease with heart failure: Secondary | ICD-10-CM | POA: Insufficient documentation

## 2019-01-20 DIAGNOSIS — I509 Heart failure, unspecified: Secondary | ICD-10-CM | POA: Diagnosis not present

## 2019-01-20 DIAGNOSIS — Z79899 Other long term (current) drug therapy: Secondary | ICD-10-CM | POA: Diagnosis not present

## 2019-01-20 DIAGNOSIS — R079 Chest pain, unspecified: Secondary | ICD-10-CM | POA: Diagnosis not present

## 2019-01-20 DIAGNOSIS — J449 Chronic obstructive pulmonary disease, unspecified: Secondary | ICD-10-CM | POA: Diagnosis not present

## 2019-01-20 DIAGNOSIS — R0602 Shortness of breath: Secondary | ICD-10-CM | POA: Diagnosis not present

## 2019-01-20 DIAGNOSIS — R0789 Other chest pain: Secondary | ICD-10-CM

## 2019-01-20 DIAGNOSIS — Z87891 Personal history of nicotine dependence: Secondary | ICD-10-CM | POA: Diagnosis not present

## 2019-01-20 DIAGNOSIS — K209 Esophagitis, unspecified without bleeding: Secondary | ICD-10-CM | POA: Insufficient documentation

## 2019-01-20 DIAGNOSIS — Z20828 Contact with and (suspected) exposure to other viral communicable diseases: Secondary | ICD-10-CM | POA: Diagnosis not present

## 2019-01-20 LAB — BASIC METABOLIC PANEL
Anion gap: 14 (ref 5–15)
BUN: 34 mg/dL — ABNORMAL HIGH (ref 8–23)
CO2: 20 mmol/L — ABNORMAL LOW (ref 22–32)
Calcium: 9 mg/dL (ref 8.9–10.3)
Chloride: 95 mmol/L — ABNORMAL LOW (ref 98–111)
Creatinine, Ser: 1.24 mg/dL (ref 0.61–1.24)
GFR calc Af Amer: >60 mL/min (ref >60)
GFR calc non Af Amer: 60 mL/min — ABNORMAL LOW (ref >60)
Glucose, Bld: 153 mg/dL — ABNORMAL HIGH (ref 70–99)
Potassium: 4.2 mmol/L (ref 3.5–5.1)
Sodium: 129 mmol/L — ABNORMAL LOW (ref 135–145)

## 2019-01-20 LAB — CBC
HCT: 44 % (ref 39.0–52.0)
Hemoglobin: 14.6 g/dL (ref 13.0–17.0)
MCH: 27 pg (ref 26.0–34.0)
MCHC: 33.2 g/dL (ref 30.0–36.0)
MCV: 81.5 fL (ref 80.0–100.0)
Platelets: 339 10*3/uL (ref 150–400)
RBC: 5.4 MIL/uL (ref 4.22–5.81)
RDW: 14.1 % (ref 11.5–15.5)
WBC: 9.7 10*3/uL (ref 4.0–10.5)
nRBC: 0 % (ref 0.0–0.2)

## 2019-01-20 LAB — TROPONIN I (HIGH SENSITIVITY)
Troponin I (High Sensitivity): 4 ng/L (ref <18)
Troponin I (High Sensitivity): 5 ng/L (ref <18)

## 2019-01-20 LAB — SARS CORONAVIRUS 2 (TAT 6-24 HRS): SARS Coronavirus 2: NEGATIVE

## 2019-01-20 MED ORDER — SODIUM CHLORIDE 0.9% FLUSH: 3.0000 mL | Freq: Once | INTRAVENOUS | Status: DC

## 2019-01-20 MED ORDER — ONDANSETRON HCL 4 MG/2ML IJ SOLN
4.0000 mg | Freq: Once | INTRAMUSCULAR | Status: AC
  Administered 2019-01-20: 4 mg via INTRAVENOUS
  Filled 2019-01-20: qty 2

## 2019-01-20 MED ORDER — SODIUM CHLORIDE 0.9 % IV BOLUS
1000.0000 mL | Freq: Once | INTRAVENOUS | Status: AC
  Administered 2019-01-20: 1000 mL via INTRAVENOUS

## 2019-01-20 MED ORDER — PANTOPRAZOLE SODIUM 20 MG PO TBEC: 20.0000 mg | DELAYED_RELEASE_TABLET | Freq: Every day | ORAL | 1 refills | Status: DC

## 2019-01-20 MED ORDER — IOHEXOL 350 MG/ML SOLN
100.0000 mL | Freq: Once | INTRAVENOUS | Status: AC | PRN
  Administered 2019-01-20: 100 mL via INTRAVENOUS

## 2019-01-20 MED ORDER — METHOCARBAMOL 500 MG PO TABS: 500.0000 mg | ORAL_TABLET | Freq: Two times a day (BID) | ORAL | 0 refills | Status: DC

## 2019-01-20 MED ORDER — HYDROCODONE-ACETAMINOPHEN 5-325 MG PO TABS: 1.0000 | ORAL_TABLET | Freq: Four times a day (QID) | ORAL | 0 refills | Status: DC | PRN

## 2019-01-20 MED ORDER — ACETAMINOPHEN 325 MG PO TABS
650.0000 mg | ORAL_TABLET | Freq: Once | ORAL | Status: AC
  Administered 2019-01-20: 650 mg via ORAL
  Filled 2019-01-20: qty 2

## 2019-01-20 NOTE — Discharge Instructions (Addendum)
Your current pain may be due to your atorvastatin.  Please stop taking this medication and follow-up with your primary care doctor to discuss options for alternatives.  There is evidence of inflammation in the esophagus, the tube going from the mouth to the stomach, called esophagitis.  Diet: Start with a clear liquid diet, progressed to a full liquid diet, and then bland solids as you are able. Please adhere to the enclosed dietary suggestions.  In general, avoid NSAIDs (i.e. ibuprofen, naproxen, etc.), caffeine, alcohol, spicy foods, fatty foods, or any other foods that seem to cause your symptoms to arise.  Protonix: Take this medication daily, 20-30 minutes prior to your first meal, for the next 8 weeks.  Continue to take this medication even if you begin to feel better.  Pepcid: Continue to take this medication, as prescribed.  Follow-up: Please follow-up with the gastroenterologist on this matter.  Call to make an appointment.  Return: Return to the ED for significantly worsening symptoms, persistent vomiting, persistent fever, vomiting blood, blood in the stools, dark stools, or any other major concerns.  For prescription assistance, may try using prescription discount sites or apps, such as goodrx.com  Leg pain: We suspect the leg pain may also resolve with stopping the atorvastatin.  However, if it does not follow-up with your primary care provider.  EKG abnormalities: There were some changes on the EKG from the last EKG in the system.  These should be followed up upon by cardiology.  Call to make an appointment.

## 2019-01-20 NOTE — ED Notes (Signed)
Patient transported to CT 

## 2019-01-20 NOTE — Patient Outreach (Addendum)
[facility]) Care Management  01/20/2019  [name] [birth year] [id number]   RN Health Coach received a call from patient. He stated that he had been having pain around his ribs and it is getting much worse. The RN asked him was it actually his chest and he stated yes. Patient stated it started on the 11 th. Per patient  his blood pressure was 182/113 p 78 on 11 th and 12 th 176/110 73 13 th 183/110 86 and 14 th 161/108 p 86 16 th 159/110 p 109 that evening 163/110 p 94 and today 170/126 p 80. The patient stated the pain is getting so much more worse today. RN told patient that he needs to go to the hospital emergency room. He is also wanting to let his Dr know so he wants to call them before he goes. Patient stated he feels very fatigue also. He stated he has been going to the bathroom a lot. He stated he has been taking his blood pressure medication.  Plan: Patient is notifying Dr and [name] to go to the Emergency Room. RN Care Coordinator notified of the above information  [facility] [phone]

## 2019-01-20 NOTE — ED Triage Notes (Signed)
Pt here for evaluation of 4 days of chest/bilateral rib cage pain that worsened this morning on waking. Pt called the health dept who told him to come here. Endorses shob associated with chest pain. Also says he has muscular back pain and cramps in both calves. Blood pressure elevated him this morning.

## 2019-01-20 NOTE — ED Provider Notes (Addendum)
[facility] EMERGENCY DEPARTMENT Provider Note   CSN: [id number] Arrival date & time: 01/20/19  0932     History   Chief Complaint Chief Complaint  Patient presents with  . Chest Pain    HPI [name] is a 68 y.o. male.     HPI   [name] is a 68 y.o. male, with a history of COPD, presenting to the ED with chest pain starting a week ago.  Patient states the pain woke him from sleep.  It originally started in the left lower rib area and has since spread across the chest. Pain is worse with exertion and deep breathing.  He also mentions that for several weeks he has had sharp pain in the back of his thighs and calves bilaterally when ambulating.  Denies fever/chills, cough, orthopnea, lower extremity edema, abdominal pain, N/V/D, numbness, weakness, syncope, or any other complaints.   Past Medical History:  Diagnosis Date  . Alcoholism ([place])   . Arthritis    "knees, hands" (03/08/2015)  . Asthma   . Chronic bronchitis ([place])   . Chronic diastolic CHF (congestive heart failure) ([place])    notes 03/08/2015  . Cirrhosis ([place])   . COPD (chronic obstructive pulmonary disease) ([place])   . Coronary artery disease   . Hepatitis C   . High cholesterol   . Hypertension   . Pneumonia 03/2015  . Shortness of breath dyspnea   . Type II diabetes mellitus [place])     Patient Active Problem List   Diagnosis Date Noted  . Insomnia 11/06/2018  . ED (erectile dysfunction) 01/09/2016  . Hepatic cirrhosis ([place]) 08/31/2015  . Routine general medical examination at a health care facility 06/24/2015  . Chronic diastolic CHF (congestive heart failure) ([place]) 03/08/2015  . Insulin dependent type 2 diabetes mellitus ([place])   . PAD (peripheral artery disease) ([place])   . Hyperlipidemia associated with type 2 diabetes mellitus ([place])   . Mild intermittent asthma 08/18/2013  . ASCVD (arteriosclerotic cardiovascular disease) 02/11/2013  . Osteoarthritis 10/27/2012  . Former  smoker 09/19/2012  . Essential hypertension, benign 08/20/2012  . GERD (gastroesophageal reflux disease) 08/20/2012  . PVD (peripheral vascular disease) ([place]) 08/20/2012    Past Surgical History:  Procedure Laterality Date  . CATARACT EXTRACTION Left 02/2014  . ESOPHAGOGASTRODUODENOSCOPY  02/09/2011   Procedure: ESOPHAGOGASTRODUODENOSCOPY (EGD);  Surgeon: Missy Sabins, MD;  Location: [facility] ENDOSCOPY;  Service: Endoscopy;  Laterality: N/A;  . TOTAL KNEE ARTHROPLASTY Left 10/22/2009        Home Medications    Prior to Admission medications   Medication Sig Start Date End Date Taking? Authorizing Provider  albuterol (VENTOLIN HFA) 108 (90 Base) MCG/ACT inhaler Inhale 1-2 puffs into the lungs every 4 (four) hours as needed for wheezing or shortness of breath. 01/09/16   Hoyt Koch, MD  Alcohol Swabs (B-D SINGLE USE SWABS REGULAR) PADS Use to swab finger 3 times daily. 03/29/16   Hoyt Koch, MD  aspirin EC 81 MG tablet Take 1 tablet (81 mg total) by mouth daily. 08/31/13   Lorayne Marek, MD  Blood Glucose Monitoring Suppl (ACCU-CHEK GUIDE ME) w/Device KIT USE ONCE DAILY AS NEEDED 02/12/18   Renato Shin, MD  cholecalciferol (VITAMIN D) 1000 UNITS tablet Take 1 tablet (1,000 Units total) by mouth daily. Patient taking differently: Take 1,000 Units by mouth 2 (two) times daily.  08/20/12   Robbie Lis, MD  Cinnamon 500 MG capsule Take 500 mg by mouth  2 (two) times daily.    [provider]  famotidine (PEPCID) 40 MG tablet Take 1 tablet (40 mg total) by mouth daily. 11/06/18   Hoyt Koch, MD  glucose blood (ACCU-CHEK GUIDE) test strip 1 each by Other route 4 (four) times daily. 12/31/18   Renato Shin, MD  HYDROcodone-acetaminophen (NORCO/VICODIN) 5-325 MG tablet Take 1-2 tablets by mouth every 6 (six) hours as needed for severe pain. 01/20/19   ,  C, PA-C  insulin NPH Human (NOVOLIN N) 100 UNIT/ML injection Inject 1.2 mLs (120 Units total) into  the skin every morning. And syringes 2/day 01/12/19   Renato Shin, MD  Lancets (ACCU-CHEK SOFT [facility]) lancets Use to check your blood sugars 4 times per day, once before each meal and once at bedtime; E11.9 02/14/18   Renato Shin, MD  lisinopril-hydrochlorothiazide (ZESTORETIC) 20-25 MG tablet Take 1 tablet by mouth daily. 11/06/18   Hoyt Koch, MD  methocarbamol (ROBAXIN) 500 MG tablet Take 1 tablet (500 mg total) by mouth 2 (two) times daily. 01/20/19   ,  C, PA-C  Multiple Vitamins-Minerals (MULTIVITAMIN WITH MINERALS) tablet Take 1 tablet by mouth daily.    [provider]  Omega-3 Fatty Acids (FISH OIL) 1000 MG CAPS Take 2 capsules by mouth daily.    [provider]  pantoprazole (PROTONIX) 20 MG tablet Take 1 tablet (20 mg total) by mouth daily. 01/20/19 03/21/19  ,  C, PA-C  sildenafil (REVATIO) 20 MG tablet TAKE 5 TABLETS BY MOUTH DAILY AS NEEDED 08/11/18   Hoyt Koch, MD  sildenafil (VIAGRA) 100 MG tablet Take 0.5-1 tablets (50-100 mg total) by mouth daily as needed for erectile dysfunction. 08/12/18   Hoyt Koch, MD  traZODone (DESYREL) 50 MG tablet Take 1 tablet (50 mg total) by mouth at bedtime as needed for sleep. 11/06/18   Hoyt Koch, MD  umeclidinium-vilanterol (ANORO ELLIPTA) 62.5-25 MCG/INH AEPB Inhale 1 puff into the lungs daily. NEED OFFICE VISIT FOR FURTHER REFILLS 10/16/18   Hoyt Koch, MD  atorvastatin (LIPITOR) 40 MG tablet Take 1 tablet (40 mg total) by mouth daily. 11/21/18 01/20/19  Hoyt Koch, MD    Family History Family History  Problem Relation Age of Onset  . Stroke Mother   . Diabetes Mother   . Hypertension Mother   . Hyperlipidemia Mother   . Heart disease Father   . Hypertension Father   . Heart attack Father     Social History Social History   Tobacco Use  . Smoking status: Former Smoker    Packs/day: 1.50    Years: 46.00    Pack years: 69.00    Types:  Cigarettes    Quit date: 03/19/2013    Years since quitting: 5.8  . Smokeless tobacco: Never Used  Substance Use Topics  . Alcohol use: No    Alcohol/week: 0.0 standard drinks    Comment: "recovering alcoholic; 10/12/1749"  . Drug use: No     Allergies   Crestor [rosuvastatin], Metformin and related, and Mobic [meloxicam]   Review of Systems Review of Systems  Constitutional: Negative for chills, diaphoresis and fever.  Respiratory: Negative for cough and shortness of breath.   Cardiovascular: Positive for chest pain. Negative for leg swelling.  Gastrointestinal: Negative for abdominal pain, diarrhea, nausea and vomiting.  Musculoskeletal: Negative for back pain.  Neurological: Negative for dizziness, syncope, weakness and numbness.  All other systems reviewed and are negative.    Physical Exam Updated Vital Signs  BP (!) 150/82 (BP Location: Right Arm)   Pulse (!) 119   Temp 98 F (36.7 C) (Oral)   Resp 16   SpO2 99%   Physical Exam Vitals signs and nursing note reviewed.  Constitutional:      General: He is not in acute distress.    Appearance: He is well-developed. He is not diaphoretic.  HENT:     Head: Normocephalic and atraumatic.     Mouth/Throat:     Mouth: Mucous membranes are moist.     Pharynx: Oropharynx is clear.  Eyes:     Conjunctiva/sclera: Conjunctivae normal.  Neck:     Musculoskeletal: Neck supple.  Cardiovascular:     Rate and Rhythm: Regular rhythm. Tachycardia present.     Pulses: Normal pulses.          Radial pulses are 2+ on the right side and 2+ on the left side.       Posterior tibial pulses are 2+ on the right side and 2+ on the left side.     Heart sounds: Normal heart sounds.     Comments: Tactile temperature in the extremities appropriate and equal bilaterally. Pulmonary:     Effort: Pulmonary effort is normal. No respiratory distress.     Breath sounds: Normal breath sounds.  Chest:     Chest wall: Tenderness present. No  deformity or swelling.       Comments: Skin of the chest appears to be normal. Abdominal:     Palpations: Abdomen is soft.     Tenderness: There is no abdominal tenderness. There is no guarding.  Musculoskeletal:     Right lower leg: No edema.     Left lower leg: No edema.     Comments: Lower extremities without tenderness, pain with range of motion, color change, or increased warmth.  Lymphadenopathy:     Cervical: No cervical adenopathy.  Skin:    General: Skin is warm and dry.  Neurological:     Mental Status: He is alert.     Comments: Sensation grossly intact to light touch in the extremities.  Grip strengths equal bilaterally.  Strength 5/5 in all extremities. No gait disturbance. Coordination intact. Cranial nerves III-XII grossly intact. No facial droop.   Psychiatric:        Mood and Affect: Mood and affect normal.        Speech: Speech normal.        Behavior: Behavior normal.      ED Treatments / Results  Labs (all labs ordered are listed, but only abnormal results are displayed) Labs Reviewed  BASIC METABOLIC PANEL - Abnormal; Notable for the following components:      Result Value   Sodium 129 (*)    Chloride 95 (*)    CO2 20 (*)    Glucose, Bld 153 (*)    BUN 34 (*)    GFR calc non Af Amer 60 (*)    All other components within normal limits  SARS CORONAVIRUS 2 (TAT 6-24 HRS)  CBC  TROPONIN I (HIGH SENSITIVITY)  TROPONIN I (HIGH SENSITIVITY)    EKG EKG Interpretation  Date/Time:  Tuesday January 20 2019 09:37:47 EST Ventricular Rate:  113 PR Interval:  138 QRS Duration: 76 QT Interval:  330 QTC Calculation: 452 R Axis:   54 Text Interpretation: Poor data quality, interpretation may be adversely affected Sinus tachycardia with Fusion complexes Nonspecific ST abnormality Abnormal ECG RBBB, no STEMI Confirmed by Octaviano Glow [phone]) on 01/20/2019 12:32:36  PM   Radiology Ct Angio Chest Pe W And/or Wo Contrast  Result Date: 01/20/2019 CLINICAL  DATA:  Chest pain. EXAM: CT ANGIOGRAPHY CHEST WITH CONTRAST TECHNIQUE: Multidetector CT imaging of the chest was performed using the standard protocol during bolus administration of intravenous contrast. Multiplanar CT image reconstructions and MIPs were obtained to evaluate the vascular anatomy. CONTRAST:  160m OMNIPAQUE IOHEXOL 350 MG/ML SOLN COMPARISON:  None. FINDINGS: Cardiovascular: Satisfactory opacification of the pulmonary arteries to the segmental level. No evidence of pulmonary embolism. Normal heart size. No pericardial effusion. Atherosclerosis of thoracic aorta is noted without aneurysm or dissection. Mediastinum/Nodes: Wall thickening of distal esophagus is noted concerning for possible inflammation. Thyroid gland is unremarkable. No adenopathy is noted. Lungs/Pleura: Lungs are clear. No pleural effusion or pneumothorax. Upper Abdomen: Nodular hepatic contours are noted concerning for possible hepatic cirrhosis. Musculoskeletal: No chest wall abnormality. No acute or significant osseous findings. Review of the MIP images confirms the above findings. IMPRESSION: No definite evidence of pulmonary embolus. Wall thickening of distal esophagus is noted concerning for possible inflammation. Endoscopy is recommended for further evaluation. Findings concerning for hepatic cirrhosis. Aortic Atherosclerosis (ICD10-I70.0). Electronically Signed   By: JMarijo ConceptionM.D.   On: 01/20/2019 13:38    Procedures Procedures (including critical care time)  Medications Ordered in ED Medications  ondansetron (ZOFRAN) injection 4 mg (4 mg Intravenous Given 01/20/19 1248)  sodium chloride 0.9 % bolus 1,000 mL (0 mLs Intravenous Stopped 01/20/19 1441)  acetaminophen (TYLENOL) tablet 650 mg (650 mg Oral Given 01/20/19 1247)  iohexol (OMNIPAQUE) 350 MG/ML injection 100 mL (100 mLs Intravenous Contrast Given 01/20/19 1304)     Initial Impression / Assessment and Plan / ED Course  I have reviewed the triage  vital signs and the nursing notes.  Pertinent labs & imaging results that were available during my care of the patient were reviewed by me and considered in my medical decision making (see chart for details).  Clinical Course as of Jan 22 1311  Tue Jan 20, 2019  1433 Patient was seen by myself as well as PA provider.  Briefly this is a 68year old male who was started on atorvastatin in the middle of September 2020, presents to emergency department with bilateral rib pain and soreness as well as cramping in his lower legs.  He reports that his symptoms have been ongoing for approximately 4 to 5 weeks.  He believes it may have started around the time that he was begun on atorvastatin by his primary care physician for high cholesterol.  He reports bilateral rib pain which is worse with arm movement.  His pain is reproducible on exam or he has diffuse bilateral rib and muscular tenderness, as well as tenderness of his lower back.  His lungs are clear to auscultation.  I explained to him that this may be a side effect of statin medication.  Advised that he stop taking the statin and follow-up with his primary care physician.  His symptoms may improve.  I also explained that he shows evidence of mild dehydration based on his BMP.  The patient reports that he does drink water, but his urine tends to be dark and concentrated.  I told him he needs to drink until his pee appears clear.  Here he got 1 L of IV fluids improved his tachycardia.     [MT]  19622Otherwise his troponin is negative his EKG shows no significant changes.  I do not believe this is a pulmonary embolism  or acute coronary syndrome based on his work-up.   [MT]    Clinical Course User Index [MT] Wyvonnia Dusky, MD       Patient presents with chest pain, constant for the last week. Patient is nontoxic appearing, afebrile, not tachypneic, not hypotensive, maintains excellent SPO2 on room air, and is in no apparent distress.  He was  tachycardic upon initial presentation.  Based on his description of pain and sudden onset, CT PE study was performed.  No PE noted on CT. However, there was esophageal thickening noted, for which he will follow-up with GI. Decreased sodium, chloride, and CO2, along with increased BUN gives suspicion for dehydration.  Patient confirms he has been eating and drinking poorly since he has not been feeling well.  Patient's pulse rate improved with IV fluids. EKG today different than previous EKG from 2018.  Conceivably, this could be a chronic change since we do not have a more recent EKG.  Regardless, patient will follow-up with cardiology on this matter. The patient was given instructions for home care as well as return precautions. Patient voices understanding of these instructions, accepts the plan, and is comfortable with discharge. After attending physician interviewed the patient, it seems as though the patient presented a different story.  He told MD symptoms have been ongoing for 5 to 6 weeks and began shortly after starting atorvastatin.  Based on this information, statin myopathy is a consideration.  He was advised to stop taking his statin and follow-up with his PCP to discuss alternatives.   Decision making on pain management here in the ED: Patient drove himself to the ED so narcotics were avoided.  He has a documented allergy to meloxicam therefore we avoided NSAIDs.  Though he has a listed history of cirrhosis, his most recent LFTs were normal, therefore a low dose of Tylenol was chosen.   Findings and plan of care discussed with Octaviano Glow, MD. Dr. Langston Masker personally evaluated and examined this patient.  Vitals:   01/20/19 0940 01/20/19 1215 01/20/19 1230 01/20/19 1245  BP: (!) 150/82 132/86 110/71 (!) 104/51  Pulse: (!) 119 (!) 108 (!) 102 93  Resp: 16 (!) _0 Temp: 98 F (36.7 C)     TempSrc: Oral     SpO2: 99% 100% 99% 100%     Final Clinical Impressions(s) / ED  Diagnoses   Final diagnoses:  Atypical chest pain  Esophagitis    ED Discharge Orders         Ordered    pantoprazole (PROTONIX) 20 MG tablet  Daily     01/20/19 1403    methocarbamol (ROBAXIN) 500 MG tablet  2 times daily     01/20/19 1403    HYDROcodone-acetaminophen (NORCO/VICODIN) 5-325 MG tablet  Every 6 hours PRN     01/20/19 1403           Lorayne Bender, PA-C 01/20/19 1415    Lorayne Bender, PA-C 01/22/19 1314    Wyvonnia Dusky, MD 01/22/19 1329

## 2019-01-20 NOTE — ED Notes (Signed)
Patient verbalizes understanding of discharge instructions. Opportunity for questioning and answers were provided. Armband removed by staff, pt discharged from ED.  

## 2019-01-26 ENCOUNTER — Ambulatory Visit (INDEPENDENT_AMBULATORY_CARE_PROVIDER_SITE_OTHER): Payer: Medicare HMO | Admitting: Internal Medicine

## 2019-01-26 ENCOUNTER — Encounter: Payer: Self-pay | Admitting: Internal Medicine

## 2019-01-26 ENCOUNTER — Other Ambulatory Visit: Payer: Self-pay

## 2019-01-26 VITALS — BP 140/86 | HR 99 | Temp 99.0°F | Ht 64.0 in | Wt 198.0 lb

## 2019-01-26 DIAGNOSIS — Z23 Encounter for immunization: Secondary | ICD-10-CM

## 2019-01-26 DIAGNOSIS — M940 Chondrocostal junction syndrome [Tietze]: Secondary | ICD-10-CM

## 2019-01-26 DIAGNOSIS — R066 Hiccough: Secondary | ICD-10-CM | POA: Diagnosis not present

## 2019-01-26 MED ORDER — CHLORPROMAZINE HCL 10 MG PO TABS: 10.0000 mg | ORAL_TABLET | Freq: Three times a day (TID) | ORAL | 0 refills | Status: DC | PRN

## 2019-01-26 MED ORDER — METHYLPREDNISOLONE ACETATE 40 MG/ML IJ SUSP
40.0000 mg | Freq: Once | INTRAMUSCULAR | Status: AC
  Administered 2019-01-26: 40 mg via INTRAMUSCULAR

## 2019-01-26 NOTE — Assessment & Plan Note (Addendum)
Given depo-medrol 40 mg IM today to help. It does not sound to be related to statin.

## 2019-01-26 NOTE — Progress Notes (Signed)
   Subjective:   Patient ID: [name], male    DOB: 1950-07-01, 68 y.o.   MRN: [id number]  HPI The patient is a 68 YO man coming in for ER follow up (in for chest pains, CTA done which ruled out PE, lung lesion, troponin negative, workup unremarkable). There was some consideration for statin myopathy with the patient. No CK done or LFTs done. Started on statin in September. He denies generalized muscle pain. It is more in the right chest wall and with moving certain way. Denies strain or overuse prior to onset. Denies SOB with activity or that the pain worsens with activity. Denies pain moving to left arm or jaw or diaphoresis. Since being in the ER he has had hiccups which have not stopped for 4 days. Has tried many home remedies and this has not helped. CT did show some potential inflammation in the esophagus and he is seeing GI in a week or two. Denies GERD symptoms.   PMH, [facility], social history reviewed and updated  Review of Systems  Constitutional: Negative.   HENT: Negative.   Eyes: Negative.   Respiratory: Negative for cough, chest tightness and shortness of breath.   Cardiovascular: Positive for chest pain. Negative for palpitations and leg swelling.  Gastrointestinal: Negative for abdominal distention, abdominal pain, constipation, diarrhea, nausea and vomiting.       Hiccups  Musculoskeletal: Positive for myalgias.  Skin: Negative.   Neurological: Negative.   Psychiatric/Behavioral: Negative.     Objective:  Physical Exam Constitutional:      Appearance: He is well-developed.     Comments: Hiccups throughout entire visit to the point of SOB  HENT:     Head: Normocephalic and atraumatic.  Neck:     Musculoskeletal: Normal range of motion.  Cardiovascular:     Rate and Rhythm: Normal rate and regular rhythm.  Pulmonary:     Effort: Pulmonary effort is normal. No respiratory distress.     Breath sounds: Normal breath sounds. No wheezing or rales.     Comments: Pain over  the right chest wall to palpation Chest:     Chest wall: Tenderness present.  Abdominal:     General: Bowel sounds are normal. There is no distension.     Palpations: Abdomen is soft.     Tenderness: There is no abdominal tenderness. There is no rebound.  Skin:    General: Skin is warm and dry.  Neurological:     Mental Status: He is alert and oriented to person, place, and time.     Coordination: Coordination normal.     Vitals:   01/26/19 1358  BP: 140/86  Pulse: 99  Temp: 99 F (37.2 C)  TempSrc: Oral  SpO2: 98%  Weight: 198 lb (89.8 kg)  Height: 5\' 4"  (1.626 m)    This visit occurred during the SARS-CoV-2 public health emergency.  Safety protocols were in place, including screening questions prior to the visit, additional usage of staff PPE, and extensive cleaning of exam room while observing appropriate contact time as indicated for disinfecting solutions.   Assessment & Plan:  Depo-medrol 40 mg IM given at visit Pneumonia 23 given at visit

## 2019-01-26 NOTE — Patient Instructions (Addendum)
We have sent in thorazine to use for the hiccups that you can use up to 3 times a day.

## 2019-01-27 ENCOUNTER — Telehealth: Payer: Self-pay | Admitting: Internal Medicine

## 2019-01-27 ENCOUNTER — Telehealth: Payer: Self-pay

## 2019-01-27 DIAGNOSIS — R066 Hiccough: Secondary | ICD-10-CM | POA: Insufficient documentation

## 2019-01-27 NOTE — Telephone Encounter (Signed)
The ER doctor thought that the statin medicine could be causing some muscle aching. That does not seem most likely to me. If he wants he can stop for 2 weeks and check in with [place] then.

## 2019-01-27 NOTE — Telephone Encounter (Signed)
Patient informed of MD response and stated understanding. Will stop cholesterol medication for two weeks and then start taking again

## 2019-01-27 NOTE — Telephone Encounter (Signed)
I don't think that this has anything to do with his symptoms but if he wants he can hold this for 2 weeks.

## 2019-01-27 NOTE — Telephone Encounter (Signed)
Doctor at ER stated he should stop the cholesterol medication when he went through all his results states no reason was given but it was not because of the hiccups. Should patient stop all together or just the two weeks

## 2019-01-27 NOTE — Assessment & Plan Note (Signed)
Rx thorazine to help. No clear cause although could be related to recent CTA. Another possibility is recent finding of inflammation of esophagus although it seems more suspicious for the contrast given that the esophageal inflammation was likely present prior to onset of hiccups. He is taking protonix which has not helped since ER. Will see GI for esophageal inflammation for likely EGD.

## 2019-01-27 NOTE — Telephone Encounter (Signed)
Copied from [place] [id number]. Topic: General - Other >> Jan 27, 2019  9:01 AM [name] wrote: Reason for CRM: FYI / Pt forgot to mention to Dr. Sharlet Salina that the Dr. At the emergency room advised him not to take his cholesterol meds anymore / please advise

## 2019-01-27 NOTE — Telephone Encounter (Signed)
Pt states that [facility] doesn't have the hiccup medication in stock and the only medication they have isn't covered by his insurance. Pt wants to know what he needs to do.

## 2019-01-28 ENCOUNTER — Other Ambulatory Visit: Payer: Self-pay

## 2019-01-28 ENCOUNTER — Ambulatory Visit (INDEPENDENT_AMBULATORY_CARE_PROVIDER_SITE_OTHER): Payer: Medicare HMO | Admitting: Endocrinology

## 2019-01-28 ENCOUNTER — Other Ambulatory Visit: Payer: Self-pay | Admitting: Internal Medicine

## 2019-01-28 ENCOUNTER — Encounter: Payer: Self-pay | Admitting: Endocrinology

## 2019-01-28 ENCOUNTER — Other Ambulatory Visit: Payer: Self-pay | Admitting: Pharmacist

## 2019-01-28 VITALS — BP 160/100 | HR 92 | Ht 64.0 in | Wt 192.0 lb

## 2019-01-28 DIAGNOSIS — E119 Type 2 diabetes mellitus without complications: Secondary | ICD-10-CM | POA: Diagnosis not present

## 2019-01-28 DIAGNOSIS — J449 Chronic obstructive pulmonary disease, unspecified: Secondary | ICD-10-CM | POA: Diagnosis not present

## 2019-01-28 DIAGNOSIS — E1122 Type 2 diabetes mellitus with diabetic chronic kidney disease: Secondary | ICD-10-CM | POA: Diagnosis not present

## 2019-01-28 DIAGNOSIS — Z794 Long term (current) use of insulin: Secondary | ICD-10-CM | POA: Diagnosis not present

## 2019-01-28 DIAGNOSIS — E1151 Type 2 diabetes mellitus with diabetic peripheral angiopathy without gangrene: Secondary | ICD-10-CM | POA: Diagnosis not present

## 2019-01-28 MED ORDER — METOCLOPRAMIDE HCL 5 MG PO TABS: 5.0000 mg | ORAL_TABLET | Freq: Three times a day (TID) | ORAL | 0 refills | Status: DC | PRN

## 2019-01-28 MED ORDER — INSULIN NPH (HUMAN) (ISOPHANE) 100 UNIT/ML ~~LOC~~ SUSP: 120.0000 [IU] | SUBCUTANEOUS | 11 refills | Status: DC

## 2019-01-28 MED ORDER — NOVOLIN 70/30 (70-30) 100 UNIT/ML ~~LOC~~ SUSP: 120.0000 [IU] | Freq: Every day | SUBCUTANEOUS | 3 refills | Status: DC

## 2019-01-28 NOTE — Telephone Encounter (Signed)
Left detailed message informing pt of below.  

## 2019-01-28 NOTE — Patient Outreach (Signed)
[facility]) Care Management  01/28/2019  [name] [birth year] [id number]   Returned call to patient.  He states that he is having some bad hiccups and he cannot get the medication as no pharmacies have the medication and patient states other medication that was prescribed cost over $100.  He is asking for assistance.  Advised patient that CM would send message to MD while on the phone with him and also send a pharmacy referral for any assistance.  Patient states he is not sure what to do right now.   He states he called the doctor this morning and waiting for a reply. Encouraged patient to wait for MD reply for other solutions.  Patient states he does not want to go to the ER.    Patient reports that he sees the endocrinologist today.  Advised patient to mention his hiccups to the doctor today as well.  He verbalized understanding. Patient reports that his diabetes had not been good but he states with increase in his insulin has made it better. He states that his most recent blood sugar was 94.  Will wait to see A1c status.  Patient declines any further needs at this time.  Plan: RN CM will refer to pharmacy. RN CM will follow up next week and patient agreeable.    [name], RN, MSN [facility] Care Management Coordinator Direct Line [phone] Cell [phone] Toll Free: [phone]  Fax: [phone]

## 2019-01-28 NOTE — Progress Notes (Signed)
Subjective:    Patient ID: [name], male    DOB: 13-Feb-1951, 68 y.o.   MRN: [id number]  HPI Pt returns for f/u of diabetes mellitus: DM type: Insulin-requiring type 2.  Dx'ed: 3428 Complications: PAD and renal insuff Therapy: insulin since 2016.   DKA: never Severe hypoglycemia: never.  Pancreatitis: never.   Other: he declines multiple daily injections; he is chronically off and on prednisone, for asthma; he changed lantus to NPH, due to pattern of cbg's; ins declines levemir; he was advised to see CDE to review insulin injections, but he did not go.   Interval history: cbg varies from 84-200's.  It is in general higher as the day goes on.  No recent steroids.  He says he never misses the insulin.     Past Medical History:  Diagnosis Date  . Alcoholism ([place])   . Arthritis    "knees, hands" (03/08/2015)  . Asthma   . Chronic bronchitis ([place])   . Chronic diastolic CHF (congestive heart failure) ([place])    notes 03/08/2015  . Cirrhosis ([place])   . COPD (chronic obstructive pulmonary disease) ([place])   . Coronary artery disease   . Hepatitis C   . High cholesterol   . Hypertension   . Pneumonia 03/2015  . Shortness of breath dyspnea   . Type II diabetes mellitus ([place])     Past Surgical History:  Procedure Laterality Date  . CATARACT EXTRACTION Left 02/2014  . ESOPHAGOGASTRODUODENOSCOPY  02/09/2011   Procedure: ESOPHAGOGASTRODUODENOSCOPY (EGD);  Surgeon: Missy Sabins, MD;  Location: [facility] ENDOSCOPY;  Service: Endoscopy;  Laterality: N/A;  . 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 Marital status: Divorced    Spouse name: Not on file  . Number of children: 4  . Years of education: Not on file  . Highest education level: Not on file  Occupational History  . Occupation: disabled  Social Needs  . Financial resource strain: Somewhat hard  . Food insecurity    Worry: Never true    Inability: Never true  . Transportation needs   Medical: No    Non-medical: No  Tobacco Use  . Smoking status: Former Smoker    Packs/day: 1.50    Years: 46.00    Pack years: 69.00    Types: Cigarettes    Quit date: 03/19/2013    Years since quitting: 5.8  . Smokeless tobacco: Never Used  Substance and Sexual Activity  . Alcohol use: No    Alcohol/week: 0.0 standard drinks    Comment: "recovering alcoholic; 7/68/1157"  . Drug use: No  . Sexual activity: Not Currently  Lifestyle  . Physical activity    Days per week: 2 days    Minutes per session: 40 min  . Stress: Only a little  Relationships  . Social connections    Talks on phone: More than three times a week    Gets together: More than three times a week    Attends religious service: More than 4 times per year    Active member of club or organization: Yes    Attends meetings of clubs or organizations: More than 4 times per year    Relationship status: Divorced  . Intimate partner violence    Fear of current or ex partner: Not on file    Emotionally abused: Not on file    Physically abused: Not on file    Forced sexual activity: Not on file  Other Topics Concern  . Not on file  Social History Narrative  . Not on file    Current Outpatient Medications on File Prior to Visit  Medication Sig Dispense Refill  . albuterol (VENTOLIN HFA) 108 (90 Base) MCG/ACT inhaler Inhale 1-2 puffs into the lungs every 4 (four) hours as needed for wheezing or shortness of breath. 54 each 3  . Alcohol Swabs (B-D SINGLE USE SWABS REGULAR) PADS Use to swab finger 3 times daily. 300 each 0  . aspirin EC 81 MG tablet Take 1 tablet (81 mg total) by mouth daily. 30 tablet 3  . Blood Glucose Monitoring Suppl (ACCU-CHEK GUIDE ME) w/Device KIT USE ONCE DAILY AS NEEDED 1 kit 0  . cholecalciferol (VITAMIN D) 1000 UNITS tablet Take 1 tablet (1,000 Units total) by mouth daily. (Patient taking differently: Take 1,000 Units by mouth 2 (two) times daily. ) 30 tablet 0  . Cinnamon 500 MG capsule Take  500 mg by mouth 2 (two) times daily.    . famotidine (PEPCID) 40 MG tablet Take 1 tablet (40 mg total) by mouth daily. 90 tablet 3  . glucose blood (ACCU-CHEK GUIDE) test strip 1 each by Other route 4 (four) times daily. 120 each 2  . HYDROcodone-acetaminophen (NORCO/VICODIN) 5-325 MG tablet Take 1-2 tablets by mouth every 6 (six) hours as needed for severe pain. 10 tablet 0  . Lancets (ACCU-CHEK SOFT TOUCH) lancets Use to check your blood sugars 4 times per day, once before each meal and once at bedtime; E11.9 100 each 12  . lisinopril-hydrochlorothiazide (ZESTORETIC) 20-25 MG tablet Take 1 tablet by mouth daily. 90 tablet 3  . methocarbamol (ROBAXIN) 500 MG tablet Take 1 tablet (500 mg total) by mouth 2 (two) times daily. 20 tablet 0  . Multiple Vitamins-Minerals (MULTIVITAMIN WITH MINERALS) tablet Take 1 tablet by mouth daily.    . Omega-3 Fatty Acids (FISH OIL) 1000 MG CAPS Take 2 capsules by mouth daily.    . pantoprazole (PROTONIX) 20 MG tablet Take 1 tablet (20 mg total) by mouth daily. 30 tablet 1  . sildenafil (REVATIO) 20 MG tablet TAKE 5 TABLETS BY MOUTH DAILY AS NEEDED 10 tablet 6  . sildenafil (VIAGRA) 100 MG tablet Take 0.5-1 tablets (50-100 mg total) by mouth daily as needed for erectile dysfunction. 5 tablet 3  . traZODone (DESYREL) 50 MG tablet Take 1 tablet (50 mg total) by mouth at bedtime as needed for sleep. 90 tablet 3  . umeclidinium-vilanterol (ANORO ELLIPTA) 62.5-25 MCG/INH AEPB Inhale 1 puff into the lungs daily. NEED OFFICE VISIT FOR FURTHER REFILLS 180 each 0  . [DISCONTINUED] atorvastatin (LIPITOR) 40 MG tablet Take 1 tablet (40 mg total) by mouth daily. 90 tablet 3   No current facility-administered medications on file prior to visit.     Allergies  Allergen Reactions  . Crestor [Rosuvastatin] Other (See Comments)    INTOLERANCE   . Metformin And Related Other (See Comments)    Stomach ache  . Mobic [Meloxicam] Rash    Family History  Problem Relation Age  of Onset  . Stroke Mother   . Diabetes Mother   . Hypertension Mother   . Hyperlipidemia Mother   . Heart disease Father   . Hypertension Father   . Heart attack Father     BP (!) 160/100   Pulse 92   Ht '5\' 4"'  (1.626 m)   Wt 192 lb (87.1 kg)   SpO2 97%   BMI 32.96 kg/m  Review of Systems He denies hypoglycemia.    Objective:   Physical Exam VITAL SIGNS:  See vs page GENERAL: no distress Pulses: dorsalis pedis intact bilat.   MSK: no deformity of the feet CV: no leg edema Skin:  no ulcer on the feet.  normal color and temp on the feet. Neuro: sensation is intact to touch on the feet  Lab Results  Component Value Date   HGBA1C 10.7 (A) 01/12/2019   Lab Results  Component Value Date   CREATININE 1.24 01/20/2019   BUN 34 (H) 01/20/2019   NA 129 (L) 01/20/2019   K 4.2 01/20/2019   CL 95 (L) 01/20/2019   CO2 20 (L) 01/20/2019       Assessment & Plan:  HTN: is noted today Insulin-requiring type 2 DM, with PAD: worse.  Renal insuff: due to this, the pattern of his cbg's indicates he needs a faster-acting qam insulin.    Patient Instructions  Your blood pressure is high today.  Please see your primary care provider soon, to have it rechecked check your blood sugar 4 times a day: before the 3 meals, and at bedtime.  also check if you have symptoms of your blood sugar being too high or too low.  please keep a record of the readings and bring it to your next appointment here (or you can bring the meter itself).  You can write it on any piece of paper.  please call us sooner if your blood sugar goes below 70, or if you have a lot of readings over 200. Please change the NPH insulin to "70/30, 120 units each morning.  You would still draw up 60 units twice.   On this type of insulin schedule, you should eat meals on a regular schedule (especiallly lunch).  If a meal is missed or significantly delayed, your blood sugar could go low.   Please come back for a follow-up  appointment in January.

## 2019-01-28 NOTE — Telephone Encounter (Signed)
Sent in metoclopramide to try instead. Up to TID prn.

## 2019-01-28 NOTE — Patient Outreach (Signed)
[facility]) Care Management  [place]   01/28/2019  ISIAHA GREENUP 1951/01/07 [id number]  Reason for referral: Medication Assistance with Thorazine  Referral source: [facility] RN Current insurance: [employer]  PMHx includes but not limited to:  HTN, HLD, GERD, PVD, OA, ASCVD, PAD, chronic diastolic CHF, former smoker, T2DM, recent ED visit for chest pain, negative work up, c/o hiccups  Outreach:  Successful telephone call with Mr. Brodhead.  HIPAA identifiers verified.   Subjective:  Patient reports he has had hiccups x 14 days and no pharmacy in the area has chlorpromazine.  He reports there is "another medication" that was also called in but is not covered.  He does not know the name of this medication.   Per review of chart, PCP called in substitution for metoclopramide this morning.   3-way call placed to [place].   -Chlorpromazine 58m - brand that IS covered by insurance is not in stock.  Pharmacy made substitution to another manufacturer that is in stock however this is NOT covered by insurance ([employer]. Even with coupon card, price is >$100 -Metoclopramide RX received, co-pay is $4, patient willing to try new medication  Objective: The 10-year ASCVD risk score (Mikey BussingDC Jr., et al., 2013) is: 37.9%   Values used to calculate the score:     Age: [age]years     Sex: Male     Is Non-Hispanic African American: Yes     Diabetic: Yes     Tobacco smoker: No     Systolic Blood Pressure: 1818mmHg     Is BP treated: Yes     HDL Cholesterol: 43 mg/dL     Total Cholesterol: 245 mg/dL  Lab Results  Component Value Date   CREATININE 1.24 01/20/2019   CREATININE 1.23 11/06/2018   CREATININE 0.90 06/17/2017    Lab Results  Component Value Date   HGBA1C 10.7 (A) 01/12/2019    Lipid Panel     Component Value Date/Time   CHOL 245 (H) 11/06/2018 1347   TRIG 235.0 (H) 11/06/2018 1347   HDL 43.00 11/06/2018 1347   CHOLHDL 6 11/06/2018 1347   VLDL 47.0 (H) 11/06/2018 1347   LDLCALC 154 (H) 06/17/2017 1401   LDLDIRECT 194.0 11/06/2018 1347    BP Readings from Last 3 Encounters:  01/26/19 140/86  01/20/19 (!) 127/43  01/12/19 (!) 148/68    Allergies  Allergen Reactions  . Crestor [Rosuvastatin] Other (See Comments)    INTOLERANCE   . Metformin And Related Other (See Comments)    Stomach ache  . Mobic [Meloxicam] Rash    Medications Reviewed Today    Reviewed by CHoyt Koch MD (Physician) on 01/26/19 at 1428  Med List Status: <None>  Medication Order Taking? Sig Documenting Provider Last Dose Status Informant  albuterol (VENTOLIN HFA) 108 (90 Base) MCG/ACT inhaler [id number] Inhale 1-2 puffs into the lungs every 4 (four) hours as needed for wheezing or shortness of breath. CHoyt Koch MD Taking Active Self  Alcohol Swabs (B-D SINGLE USE SWABS REGULAR) PADS [id number] Use to swab finger 3 times daily. CHoyt Koch MD Taking Active Self  aspirin EC 81 MG tablet [id number] Take 1 tablet (81 mg total) by mouth daily. ALorayne Marek MD Taking Active Self        Discontinued 01/20/19 1423   Blood Glucose Monitoring Suppl (ACCU-CHEK GUIDE ME) w/Device KIT [id number] USE ONCE DAILY AS NEEDED ERenato Shin MD Taking Active  cholecalciferol (VITAMIN D) 1000 UNITS tablet [id number] Yes Take 1 tablet (1,000 Units total) by mouth daily.  Patient taking differently: Take 1,000 Units by mouth 2 (two) times daily.    Robbie Lis, MD Taking Active Self  Cinnamon 500 MG capsule [id number] Yes Take 500 mg by mouth 2 (two) times daily. [provider] Taking Active Self  famotidine (PEPCID) 40 MG tablet [id number] Yes Take 1 tablet (40 mg total) by mouth daily. Hoyt Koch, MD Taking Active   glucose blood (ACCU-CHEK GUIDE) test strip [id number] Yes 1 each by Other route 4 (four) times daily. Renato Shin, MD Taking Active   HYDROcodone-acetaminophen (NORCO/VICODIN) 5-325 MG  tablet [id number] Yes Take 1-2 tablets by mouth every 6 (six) hours as needed for severe pain. Joy, Shawn C, PA-C Taking Active   insulin NPH Human (NOVOLIN N) 100 UNIT/ML injection [id number] Yes Inject 1.2 mLs (120 Units total) into the skin every morning. And syringes 2/day Renato Shin, MD Taking Active   Lancets [name] SOFT [facility]) lancets [id number] Yes Use to check your blood sugars 4 times per day, once before each meal and once at bedtime; E11.9 Renato Shin, MD Taking Active   lisinopril-hydrochlorothiazide (ZESTORETIC) 20-25 MG tablet [id number] Yes Take 1 tablet by mouth daily. Hoyt Koch, MD Taking Active   methocarbamol (ROBAXIN) 500 MG tablet [id number] Yes Take 1 tablet (500 mg total) by mouth 2 (two) times daily. Lorayne Bender, PA-C Taking Active   Multiple Vitamins-Minerals (MULTIVITAMIN WITH MINERALS) tablet [id number] Yes Take 1 tablet by mouth daily. [provider] Taking Active Self  Omega-3 Fatty Acids (FISH OIL) 1000 MG CAPS [id number] Yes Take 2 capsules by mouth daily. [provider] Taking Active Self  pantoprazole (PROTONIX) 20 MG tablet [id number] Yes Take 1 tablet (20 mg total) by mouth daily. Lorayne Bender, PA-C Taking Active   sildenafil (REVATIO) 20 MG tablet [id number] Yes TAKE 5 TABLETS BY MOUTH DAILY AS NEEDED Hoyt Koch, MD Taking Active   sildenafil (VIAGRA) 100 MG tablet [id number] Yes Take 0.5-1 tablets (50-100 mg total) by mouth daily as needed for erectile dysfunction. Hoyt Koch, MD Taking Active   traZODone (DESYREL) 50 MG tablet [id number] Yes Take 1 tablet (50 mg total) by mouth at bedtime as needed for sleep. Hoyt Koch, MD Taking Active   umeclidinium-vilanterol [facility] ELLIPTA) 62.5-25 MCG/INH AEPB [id number] Yes Inhale 1 puff into the lungs daily. NEED OFFICE VISIT FOR FURTHER REFILLS Hoyt Koch, MD Taking Active           Assessment: Patient declines medication review  RX Outreach  Program:  Chlorpromazine available through program for 30 day supply  = $16 OR 90 day supply =  $35.    -Reviewed program with patient who will contact program if metoclopramide does not work.  Provided phone number of RX Outreach program to patient.    Plan: . Will close [facility] pharmacy case as no further medication needs identified at this time.  Am happy to assist in the future as needed.     Ralene Bathe, PharmD, [place] [phone]

## 2019-01-28 NOTE — Patient Instructions (Addendum)
Your blood pressure is high today.  Please see your primary care provider soon, to have it rechecked check your blood sugar 4 times a day: before the 3 meals, and at bedtime.  also check if you have symptoms of your blood sugar being too high or too low.  please keep a record of the readings and bring it to your next appointment here (or you can bring the meter itself).  You can write it on any piece of paper.  please call us sooner if your blood sugar goes below 70, or if you have a lot of readings over 200. Please change the NPH insulin to "70/30, 120 units each morning.  You would still draw up 60 units twice.   On this type of insulin schedule, you should eat meals on a regular schedule (especiallly lunch).  If a meal is missed or significantly delayed, your blood sugar could go low.   Please come back for a follow-up appointment in January.

## 2019-01-28 NOTE — Telephone Encounter (Signed)
I would recommend to try calling other pharmacies closer to him and we can send rx there.

## 2019-02-05 ENCOUNTER — Other Ambulatory Visit: Payer: Self-pay

## 2019-02-05 NOTE — Patient Outreach (Signed)
[facility]) Care Management  02/05/2019  SEMIH PLAUCHE 1950-03-30 [id number]   Telephone call to patient for follow up.  No answer.  HIPAA compliant voice message left.  Plan: RN CM will attempt patient again in the month of January and send letter.   [name], RN, MSN [facility] Care Management Coordinator Direct Line [phone] Cell [phone] Toll Free: [phone]  Fax: [phone]

## 2019-02-11 ENCOUNTER — Other Ambulatory Visit (INDEPENDENT_AMBULATORY_CARE_PROVIDER_SITE_OTHER): Payer: Medicare HMO

## 2019-02-11 ENCOUNTER — Ambulatory Visit (INDEPENDENT_AMBULATORY_CARE_PROVIDER_SITE_OTHER): Payer: Medicare HMO | Admitting: Physician Assistant

## 2019-02-11 ENCOUNTER — Encounter: Payer: Self-pay | Admitting: Physician Assistant

## 2019-02-11 VITALS — BP 140/60 | HR 92 | Temp 98.3°F | Ht 64.0 in | Wt 198.2 lb

## 2019-02-11 DIAGNOSIS — K7460 Unspecified cirrhosis of liver: Secondary | ICD-10-CM

## 2019-02-11 DIAGNOSIS — K209 Esophagitis, unspecified without bleeding: Secondary | ICD-10-CM

## 2019-02-11 DIAGNOSIS — R195 Other fecal abnormalities: Secondary | ICD-10-CM

## 2019-02-11 DIAGNOSIS — R9389 Abnormal findings on diagnostic imaging of other specified body structures: Secondary | ICD-10-CM | POA: Diagnosis not present

## 2019-02-11 DIAGNOSIS — Z1159 Encounter for screening for other viral diseases: Secondary | ICD-10-CM

## 2019-02-11 DIAGNOSIS — K2100 Gastro-esophageal reflux disease with esophagitis, without bleeding: Secondary | ICD-10-CM

## 2019-02-11 LAB — PROTIME-INR
INR: 1.1 ratio — ABNORMAL HIGH (ref 0.8–1.0)
Prothrombin Time: 13.4 s — ABNORMAL HIGH (ref 9.6–13.1)

## 2019-02-11 MED ORDER — PANTOPRAZOLE SODIUM 40 MG PO TBEC: 40.0000 mg | DELAYED_RELEASE_TABLET | Freq: Every day | ORAL | 11 refills | Status: DC

## 2019-02-11 MED ORDER — NA SULFATE-K SULFATE-MG SULF 17.5-3.13-1.6 GM/177ML PO SOLN: 1.0000 | Freq: Once | ORAL | 0 refills | Status: AC

## 2019-02-11 NOTE — Progress Notes (Signed)
Assessment and plan reviewed 

## 2019-02-11 NOTE — Patient Instructions (Signed)
Your provider has requested that you go to the basement level for lab work before leaving today. Press "B" on the elevator. The lab is located at the first door on the left as you exit the elevator.   We have sent the following medications to your pharmacy for you to pick up at your convenience: Pantoprazole, suprep prep kit  You have been scheduled for an endoscopy and colonoscopy. Please follow the written instructions given to you at your visit today. Please pick up your prep supplies at the pharmacy within the next 1-3 days. If you use inhalers (even only as needed), please bring them with you on the day of your procedure.   I appreciate the opportunity to care for you. Amy Esterwood, PA-C

## 2019-02-11 NOTE — Progress Notes (Signed)
Subjective:    Patient ID: [name], male    DOB: [birth year], 68 y.o.   MRN: [id number]  HPI [name] is a pleasant 68 year old African-American male, known previously to Dr. Marina Owens and last seen in 2018.  He is referred today after recent ER visit on 01/20/2019, at which time he was complaining of chest pain, rib pain and hiccups..  He had CT angio of the chest done to rule out pulmonary embolus.  This was negative for PE but did show wall thickening of the distal esophagus consistent with inflammation. Troponins were negative, CBC within normal limits, sodium 129, LFTs normal and glucose 153. He says he had been taking Pepcid 40 mg daily chronically but recently had Protonix 20 mg daily added.  He was seen by his PCP since the ER visit and also was given clopamide 5 mg every 8 hours to use as needed. He says that he is feeling better at this point and hiccups have resolved.  He has just run out of pantoprazole. On further questioning he denies any problems with dysphagia or odynophagia.  Chest discomfort has resolved.  He denies any abdominal pain.  Appetite has been fine, and he has actually gained weight. He did have one remote EGD done in 2012 by Dr. Madilyn Owens at which point he had a bleeding Mallory-Weiss tear. Reviewing his labs etc. also noted that he had a positive Cologuard in 2019/May.  He has not had colonoscopy.  He says he knows he was supposed to be scheduled but does not have any family here or friend to come with him for the procedure.  He has not noticed any change in his bowel habits melena or hematochezia. CT angio of the chest November 2020 also showed nodular hepatic contours question cirrhosis. Patient has history of hepatitis C for which has been treated and hep C eradicated.  He also has history of heavy EtOH use in the past but none over the past 20 years.  Review of Systems Pertinent positive and negative review of systems were noted in the above HPI section.  All other  review of systems was otherwise negative.  Outpatient Encounter Medications as of 02/11/2019  Medication Sig  . Alcohol Swabs (B-D SINGLE USE SWABS REGULAR) PADS Use to swab finger 3 times daily.  [name] aspirin EC 81 MG tablet Take 1 tablet (81 mg total) by mouth daily.  . Blood Glucose Monitoring Suppl (ACCU-CHEK GUIDE ME) w/Device KIT USE ONCE DAILY AS NEEDED  . cholecalciferol (VITAMIN D) 1000 UNITS tablet Take 1 tablet (1,000 Units total) by mouth daily. (Patient taking differently: Take 1,000 Units by mouth 2 (two) times daily. )  . Cinnamon 500 MG capsule Take 500 mg by mouth 2 (two) times daily.  . famotidine (PEPCID) 40 MG tablet Take 1 tablet (40 mg total) by mouth daily.  [name] glucose blood (ACCU-CHEK GUIDE) test strip 1 each by Other route 4 (four) times daily.  [name] HYDROcodone-acetaminophen (NORCO/VICODIN) 5-325 MG tablet Take 1-2 tablets by mouth every 6 (six) hours as needed for severe pain.  [name] insulin NPH-regular Human (NOVOLIN 70/30) (70-30) 100 UNIT/ML injection Inject 120 Units into the skin daily with breakfast.  . Lancets (ACCU-CHEK SOFT TOUCH) lancets Use to check your blood sugars 4 times per day, once before each meal and once at bedtime; E11.9  . lisinopril-hydrochlorothiazide (ZESTORETIC) 20-25 MG tablet Take 1 tablet by mouth daily.  . methocarbamol (ROBAXIN) 500 MG tablet Take 1 tablet (500 mg total) by mouth  2 (two) times daily.  . metoCLOPramide (REGLAN) 5 MG tablet Take 1 tablet (5 mg total) by mouth every 8 (eight) hours as needed for nausea.  . Multiple Vitamins-Minerals (MULTIVITAMIN WITH MINERALS) tablet Take 1 tablet by mouth daily.  . Omega-3 Fatty Acids (FISH OIL) 1000 MG CAPS Take 2 capsules by mouth daily.  . sildenafil (REVATIO) 20 MG tablet TAKE 5 TABLETS BY MOUTH DAILY AS NEEDED  . sildenafil (VIAGRA) 100 MG tablet Take 0.5-1 tablets (50-100 mg total) by mouth daily as needed for erectile dysfunction.  . traZODone (DESYREL) 50 MG tablet Take 1 tablet (50 mg total)  by mouth at bedtime as needed for sleep.  [name] umeclidinium-vilanterol (ANORO ELLIPTA) 62.5-25 MCG/INH AEPB Inhale 1 puff into the lungs daily. NEED OFFICE VISIT FOR FURTHER REFILLS  . albuterol (VENTOLIN HFA) 108 (90 Base) MCG/ACT inhaler Inhale 1-2 puffs into the lungs every 4 (four) hours as needed for wheezing or shortness of breath. (Patient not taking: Reported on 02/11/2019)  . Na Sulfate-K Sulfate-Mg Sulf 17.5-3.13-1.6 GM/177ML SOLN Take 1 kit by mouth once for 1 dose.  . pantoprazole (PROTONIX) 40 MG tablet Take 1 tablet (40 mg total) by mouth daily before breakfast.  . [DISCONTINUED] atorvastatin (LIPITOR) 40 MG tablet Take 1 tablet (40 mg total) by mouth daily.  . [DISCONTINUED] pantoprazole (PROTONIX) 20 MG tablet Take 1 tablet (20 mg total) by mouth daily. (Patient not taking: Reported on 02/11/2019)   No facility-administered encounter medications on file as of 02/11/2019.    Allergies  Allergen Reactions  . Crestor [Rosuvastatin] Other (See Comments)    INTOLERANCE   . Metformin And Related Other (See Comments)    Stomach ache  . Mobic [Meloxicam] Rash   Patient Active Problem List   Diagnosis Date Noted  . Intractable hiccups 01/27/2019  . Costochondritis 01/26/2019  . Insomnia 11/06/2018  . ED (erectile dysfunction) 01/09/2016  . Hepatic cirrhosis (HCC) 08/31/2015  . Routine general medical examination at a health care facility 06/24/2015  . Chronic diastolic CHF (congestive heart failure) (HCC) 03/08/2015  . Insulin dependent type 2 diabetes mellitus (HCC)   . PAD (peripheral artery disease) (HCC)   . Hyperlipidemia associated with type 2 diabetes mellitus (HCC)   . Mild intermittent asthma 08/18/2013  . ASCVD (arteriosclerotic cardiovascular disease) 02/11/2013  . Osteoarthritis 10/27/2012  . Former smoker 09/19/2012  . Essential hypertension, benign 08/20/2012  . GERD (gastroesophageal reflux disease) 08/20/2012  . PVD (peripheral vascular disease) (HCC) 08/20/2012    Social History   Socioeconomic History  . Marital status: Divorced    Spouse name: Not on file  . Number of children: 4  . Years of education: Not on file  . Highest education level: Not on file  Occupational History  . Occupation: disabled  Social Needs  . Financial resource strain: Somewhat hard  . Food insecurity    Worry: Never true    Inability: Never true  . Transportation needs    Medical: No    Non-medical: No  Tobacco Use  . Smoking status: Former Smoker    Packs/day: 1.50    Years: 46.00    Pack years: 69.00    Types: Cigarettes    Quit date: 03/19/2013    Years since quitting: 5.9  . Smokeless tobacco: Never Used  Substance and Sexual Activity  . 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 Drug use: No  . Sexual activity: Not Currently  Lifestyle  . Physical activity    Days per week: 2 days    Minutes per session: 40 min  . Stress: Only a little  Relationships  . Social connections    Talks on phone: More than three times a week    Gets together: More than three times a week    Attends religious service: More than 4 times per year    Active member of club or organization: Yes    Attends meetings of clubs or organizations: More than 4 times per year    Relationship status: Divorced  . Intimate partner violence    Fear of current or ex partner: Not on file    Emotionally abused: Not on file    Physically abused: Not on file    Forced sexual activity: Not on file  Other Topics Concern  . Not on file  Social History Narrative  . Not on file    Mr. Ritzman family history includes Diabetes in his mother; Heart attack in his father; Heart disease in his father; Hyperlipidemia in his mother; Hypertension in his father and mother; Stroke in his mother.      Objective:    Vitals:   02/11/19 1054  BP: 140/60  Pulse: 92  Temp: 98.3 F (36.8 C)    Physical Exam Well-developed  well-nourished older African-American male in no acute distress.  Very Pleasant weight, 198 BMI 34.0  HEENT; nontraumatic normocephalic, EOMI, PER R LA, sclera anicteric. Oropharynx; not examined/mask/Covid Neck; supple, no JVD Cardiovascular; regular rate and rhythm with S1-S2, no murmur rub or gallop Pulmonary; Clear bilaterally Abdomen; soft, obese, nontender, nondistended, no palpable mass or hepatosplenomegaly, bowel sounds are active Rectal; not done today Skin; benign exam, no jaundice rash or appreciable lesions Extremities; no clubbing cyanosis or edema skin warm and dry Neuro/Psych; alert and oriented x4, grossly nonfocal mood and affect appropriate       Assessment & Plan:   #52 68 year old African-American male with recent ER visit with atypical chest pain, and noted on CT to have wall thickening of the distal esophagus.  Patient also had a recent prolonged episode of pickups.  He had been on Pepcid 40 mg daily chronically for GERD, had Protonix 20 mg daily added to regimen. He is currently feeling better without chest pain, heartburn or hiccups.  Suspect acute reflux esophagitis with secondary hiccups secondary to diaphragmatic irritation, rule out Barrett's, rule out underlying neoplasm  #2, positive Cologuard 2019, no prior colonoscopy-rule out occult colon neoplasm  #3 probable compensated cirrhosis--nodular appearing liver noted on recent CT angio of the chest.  Most recent labs with normal LFTs and platelet count. Patient has history of hepatitis C, treated and eradicated and prior history of EtOH abuse and active for many years  #4 peripheral arterial disease 5.  Congestive heart failure 6.  Insulin-dependent diabetes mellitus #7 hypertension  Plan; continue Protonix, increase to 40 mg p.o. every morning-prescription sent with several refills May stop Pepcid Stop Reglan Antireflux regimen Patient will be scheduled for Colonoscopy and upper Endoscopy with Dr.  Yancey Flemings.  Both procedures were discussed in detail with patient today including indications risks and benefits and he is agreeable to proceed.  We will check pro time/INR and AFP for surveillance of cirrhosis.  Patient was advised he should have at least annual follow-up with GI for maturing of cirrhosis.       Richetta Cubillos S Byrd Rushlow PA-C 02/11/2019   Cc: [name], *

## 2019-02-12 LAB — AFP TUMOR MARKER: AFP-Tumor Marker: 10203 ng/mL — ABNORMAL HIGH (ref <6.1)

## 2019-02-13 ENCOUNTER — Ambulatory Visit: Payer: Medicare HMO

## 2019-02-17 ENCOUNTER — Other Ambulatory Visit: Payer: Self-pay

## 2019-02-17 DIAGNOSIS — K760 Fatty (change of) liver, not elsewhere classified: Secondary | ICD-10-CM

## 2019-02-17 DIAGNOSIS — K7460 Unspecified cirrhosis of liver: Secondary | ICD-10-CM

## 2019-02-17 DIAGNOSIS — R978 Other abnormal tumor markers: Secondary | ICD-10-CM

## 2019-02-18 ENCOUNTER — Other Ambulatory Visit: Payer: Self-pay

## 2019-02-18 ENCOUNTER — Other Ambulatory Visit (INDEPENDENT_AMBULATORY_CARE_PROVIDER_SITE_OTHER): Payer: Medicare HMO

## 2019-02-18 ENCOUNTER — Ambulatory Visit (INDEPENDENT_AMBULATORY_CARE_PROVIDER_SITE_OTHER)
Admission: RE | Admit: 2019-02-18 | Discharge: 2019-02-18 | Disposition: A | Discharge status: Home / Self Care | Payer: Medicare HMO | Source: Ambulatory Visit | Attending: Physician Assistant | Admitting: Physician Assistant

## 2019-02-18 ENCOUNTER — Telehealth: Payer: Self-pay | Admitting: Physician Assistant

## 2019-02-18 DIAGNOSIS — K7460 Unspecified cirrhosis of liver: Secondary | ICD-10-CM

## 2019-02-18 DIAGNOSIS — R978 Other abnormal tumor markers: Secondary | ICD-10-CM

## 2019-02-18 DIAGNOSIS — C220 Liver cell carcinoma: Secondary | ICD-10-CM

## 2019-02-18 LAB — COMPREHENSIVE METABOLIC PANEL
ALT: 47 U/L (ref 0–53)
AST: 104 U/L — ABNORMAL HIGH (ref 0–37)
Albumin: 4.2 g/dL (ref 3.5–5.2)
Alkaline Phosphatase: 92 U/L (ref 39–117)
BUN: 17 mg/dL (ref 6–23)
CO2: 22 mEq/L (ref 19–32)
Calcium: 9.2 mg/dL (ref 8.4–10.5)
Chloride: 99 mEq/L (ref 96–112)
Creatinine, Ser: 1.05 mg/dL (ref 0.40–1.50)
GFR: 85.13 mL/min (ref >60.00)
Glucose, Bld: 274 mg/dL — ABNORMAL HIGH (ref 70–99)
Potassium: 4.1 mEq/L (ref 3.5–5.1)
Sodium: 131 mEq/L — ABNORMAL LOW (ref 135–145)
Total Bilirubin: 1 mg/dL (ref 0.2–1.2)
Total Protein: 7.8 g/dL (ref 6.0–8.3)

## 2019-02-18 MED ORDER — IOHEXOL 300 MG/ML  SOLN
100.0000 mL | Freq: Once | INTRAMUSCULAR | Status: AC | PRN
  Administered 2019-02-18: 12:00:00 100 mL via INTRAVENOUS

## 2019-02-18 NOTE — Telephone Encounter (Signed)
Pt called about his Ct scan results, he can see them in mychart but does not understand what it means. Pls call him.

## 2019-02-19 ENCOUNTER — Telehealth: Payer: Self-pay | Admitting: Oncology

## 2019-02-19 ENCOUNTER — Telehealth: Payer: Self-pay | Admitting: Nurse Practitioner

## 2019-02-19 NOTE — Telephone Encounter (Signed)
[name] has been cld and scheduled to see Dr. Benay Spice on 12/23 at 930am. Pt is aware to arrive 15 minutes early.

## 2019-02-19 NOTE — Telephone Encounter (Signed)
New patient appt has been rescheduled to 10:15am w/Lisa per Dr. Benay Spice. [name] has been called and made aware of the new appt time.

## 2019-02-19 NOTE — Telephone Encounter (Addendum)
Patient contacted by Nicoletta Ba, PA-C. Left a message for the referral staff at [facility]. No identifying name or information is on that voicemail. Left the patient's information.

## 2019-02-23 ENCOUNTER — Inpatient Hospital Stay: Payer: Medicare HMO | Admitting: Internal Medicine

## 2019-02-25 ENCOUNTER — Encounter: Payer: Self-pay | Admitting: Nurse Practitioner

## 2019-02-25 ENCOUNTER — Other Ambulatory Visit (HOSPITAL_COMMUNITY): Payer: Medicare HMO

## 2019-02-25 ENCOUNTER — Ambulatory Visit: Payer: Medicare HMO | Admitting: Nurse Practitioner

## 2019-02-25 ENCOUNTER — Other Ambulatory Visit: Payer: Self-pay

## 2019-02-25 ENCOUNTER — Inpatient Hospital Stay: Payer: Medicare HMO | Attending: Nurse Practitioner | Admitting: Nurse Practitioner

## 2019-02-25 ENCOUNTER — Encounter: Payer: Self-pay | Admitting: *Deleted

## 2019-02-25 ENCOUNTER — Ambulatory Visit: Payer: Medicare HMO | Admitting: Oncology

## 2019-02-25 ENCOUNTER — Telehealth: Payer: Self-pay | Admitting: Oncology

## 2019-02-25 VITALS — BP 149/72 | HR 99 | Temp 97.8°F | Resp 17 | Ht 64.0 in | Wt 191.8 lb

## 2019-02-25 DIAGNOSIS — C220 Liver cell carcinoma: Secondary | ICD-10-CM

## 2019-02-25 NOTE — Progress Notes (Signed)
Due to food insecurity provided patient with bag of groceries from pantry and dietician provided case of Boost Glucose control and Glucerna.

## 2019-02-25 NOTE — Patient Outreach (Signed)
[facility]) Care Management  02/25/2019  [name] 03/27/1950 [id number]   Received in basket message from Edwyna Shell at the Cancer.  Patient is new to the cancer center and acknowledged some food insecurities on his most recent visit at the cancer center.  Patient was given food and supplements from the cancer center dietitian.  Request for other food options from [facility].    Plan: RN CM will refer to [facility] social worker for food assistance.    [name], RN, MSN [facility] Care Management Coordinator Direct Line [phone] Cell [phone] Toll Free: [phone]  Fax: [phone]

## 2019-02-25 NOTE — Progress Notes (Signed)
[place] Work  Clinical Social Work received referral from medical oncology.  CSW contacted patient at home to offer support and assess for needs.  CSW left patient a detailed message offering support and information on the support team and support services at [facility].  CSW provided contact information and encouraged patient to call with questions or concerns.    Johnnye Lana, MSW, LCSW, OSW-C Clinical Social Worker [facility] [phone]

## 2019-02-25 NOTE — Progress Notes (Addendum)
New Hematology/Oncology Consult   Requesting MD: Dr. Scarlette Shorts  [phone]  Reason for Consult: Hepatocellular carcinoma  HPI: [name] is a 68 year old man with cirrhosis, history of hepatitis C status post Harvoni, type 2 diabetes, COPD, CAD, chronic diastolic CHF, hypertension. [name] presented to the emergency department 01/20/2019 with rib pain/tenderness.  CT angio chest showed no definite evidence of pulmonary embolus.  Wall thickening of the distal esophagus was noted.  Hepatic contours appeared nodular.  [name] was seen by Nicoletta Ba, PA with GI on 02/11/2019.  [name] was scheduled for a colonoscopy and upper endoscopy with Dr. Henrene Pastor.  AFP was checked and returned significantly elevated at 10,203.  CT abdomen 02/18/2019 showed a dominant poorly marginated heterogeneously enhancing 8.6 x 7.7 cm posterior right liver lobe mass, new since the comparison study 09/10/2016.  There were also numerous (greater than 10) similar-appearing heterogeneously enhancing liver masses scattered throughout the liver, all new since 09/10/2016.  Liver surface was diffusely irregular compatible with cirrhosis.  Spleen was normal size.  No ascites and no abdominal adenopathy.     Past Medical History:  Diagnosis Date  . Alcoholism ([place])   . Arthritis    "knees, hands" (03/08/2015)  . Asthma   . Chronic bronchitis ([place])   . Chronic diastolic CHF (congestive heart failure) ([place])    notes 03/08/2015  . Cirrhosis ([place])   . COPD (chronic obstructive pulmonary disease) ([place])   . Coronary artery disease   . Hepatitis C   . High cholesterol   . Hypertension   . Pneumonia 03/2015  . Shortness of breath dyspnea   . Type II diabetes mellitus ([place])   :   Past Surgical History:  Procedure Laterality Date  . CATARACT EXTRACTION Left 02/2014  . ESOPHAGOGASTRODUODENOSCOPY  02/09/2011   Procedure: ESOPHAGOGASTRODUODENOSCOPY (EGD);  Surgeon: Missy Sabins, MD;  Location: [facility] ENDOSCOPY;  Service: Endoscopy;  Laterality: N/A;  .  TOTAL KNEE ARTHROPLASTY Left 10/22/2009  :   Current Outpatient Medications:  .  albuterol (VENTOLIN HFA) 108 (90 Base) MCG/ACT inhaler, Inhale 1-2 puffs into the lungs every 4 (four) hours as needed for wheezing or shortness of breath., Disp: 54 each, Rfl: 3 .  Alcohol Swabs (B-D SINGLE USE SWABS REGULAR) PADS, Use to swab finger 3 times daily., Disp: 300 each, Rfl: 0 .  aspirin EC 81 MG tablet, Take 1 tablet (81 mg total) by mouth daily., Disp: 30 tablet, Rfl: 3 .  Blood Glucose Monitoring Suppl (ACCU-CHEK GUIDE ME) w/Device KIT, USE ONCE DAILY AS NEEDED, Disp: 1 kit, Rfl: 0 .  cholecalciferol (VITAMIN D) 1000 UNITS tablet, Take 1 tablet (1,000 Units total) by mouth daily. (Patient taking differently: Take 1,000 Units by mouth 2 (two) times daily. ), Disp: 30 tablet, Rfl: 0 .  Cinnamon 500 MG capsule, Take 500 mg by mouth 2 (two) times daily., Disp: , Rfl:  .  glucose blood (ACCU-CHEK GUIDE) test strip, 1 each by Other route 4 (four) times daily., Disp: 120 each, Rfl: 2 .  insulin NPH-regular Human (NOVOLIN 70/30) (70-30) 100 UNIT/ML injection, Inject 120 Units into the skin daily with breakfast., Disp: 40 mL, Rfl: 3 .  Lancets (ACCU-CHEK SOFT TOUCH) lancets, Use to check your blood sugars 4 times per day, once before each meal and once at bedtime; E11.9, Disp: 100 each, Rfl: 12 .  lisinopril-hydrochlorothiazide (ZESTORETIC) 20-25 MG tablet, Take 1 tablet by mouth daily., Disp: 90 tablet, Rfl: 3 .  methocarbamol (ROBAXIN) 500 MG tablet, Take 1 tablet (500  mg total) by mouth 2 (two) times daily., Disp: 20 tablet, Rfl: 0 .  metoCLOPramide (REGLAN) 5 MG tablet, Take 1 tablet (5 mg total) by mouth every 8 (eight) hours as needed for nausea., Disp: 60 tablet, Rfl: 0 .  Multiple Vitamins-Minerals (MULTIVITAMIN WITH MINERALS) tablet, Take 1 tablet by mouth daily., Disp: , Rfl:  .  Omega-3 Fatty Acids (FISH OIL) 1000 MG CAPS, Take 2 capsules by mouth daily., Disp: , Rfl:  .  pantoprazole (PROTONIX) 40  MG tablet, Take 1 tablet (40 mg total) by mouth daily before breakfast., Disp: 30 tablet, Rfl: 11 .  traZODone (DESYREL) 50 MG tablet, Take 1 tablet (50 mg total) by mouth at bedtime as needed for sleep., Disp: 90 tablet, Rfl: 3 .  umeclidinium-vilanterol (ANORO ELLIPTA) 62.5-25 MCG/INH AEPB, Inhale 1 puff into the lungs daily. NEED OFFICE VISIT FOR FURTHER REFILLS, Disp: 180 each, Rfl: 0 .  sildenafil (REVATIO) 20 MG tablet, TAKE 5 TABLETS BY MOUTH DAILY AS NEEDED (Patient not taking: Reported on 02/25/2019), Disp: 10 tablet, Rfl: 6 .  sildenafil (VIAGRA) 100 MG tablet, Take 0.5-1 tablets (50-100 mg total) by mouth daily as needed for erectile dysfunction. (Patient not taking: Reported on 02/25/2019), Disp: 5 tablet, Rfl: 3:  :   Allergies  Allergen Reactions  . Crestor [Rosuvastatin] Other (See Comments)    INTOLERANCE   . Metformin And Related Other (See Comments)    Stomach ache  . Mobic [Meloxicam] Rash  :  FH: No family history of malignancy.  Mother deceased with a stroke, father deceased with an MI.  2 sisters and 2 brothers living.  SOCIAL HISTORY: [name] lives in [place].  [name] is divorced.  [name] has 1 son and 3 daughters.  None of his children live locally.  [name] is on disability.  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  [name] quit smoking cigarettes and cigars 6 to 7 years ago.  [name] estimates smoking for 30+ years.  [name] quit drinking alcohol 20 years ago.  Review of Systems: [name] notes a recent decrease in appetite.  [name] attributes this to the cancer diagnosis.  Some weight loss.  No fevers or sweats.  [name] has occasional cold chills.  [name] has intermittent hiccups.  No pain.  [name] denies bleeding.  No unusual headaches or vision change.  [name] has occasional dyspnea on exertion.  [name] has an occasional cough.  No chest pain.  No leg swelling or calf pain.  No nausea/vomiting.  No dysphagia.  No change in bowel habits.  No bloody or black bowel movements.  No hematuria or  dysuria.  [name] denies focal weakness.  [name] has arthritis in both hands.  [name] has a generalized nonpruritic skin rash present for several years.  Physical Exam:  Blood pressure (!) 149/72, pulse 99, temperature 97.8 F (36.6 C), temperature source Temporal, resp. rate 17, height '5\' 4"'  (1.626 m), weight 191 lb 12.8 oz (87 kg), SpO2 100 %.  HEENT: No thrush or ulcers.  Neck without mass. Lungs: Distant breath sounds.  No respiratory distress. Cardiac: Regular rate and rhythm. Abdomen: Abdomen soft and nontender.  No hepatosplenomegaly.  No ascites. Vascular: No leg edema. Lymph nodes: No palpable cervical, supraclavicular, axillary or inguinal lymph nodes. Neurologic: Alert and oriented.  Follows commands. Skin: Trunk with scattered small, hyperpigmented flat skin lesions.  LABS: 02/11/2019 PT 13.4/INR 1.1; 02/18/2019 bilirubin 1.0, alkaline phosphatase 92, AST 104, ALT 47  RADIOLOGY:  CT ABDOMEN W WO CONTRAST  Result Date: 02/18/2019 CLINICAL DATA:  Cirrhosis.  Hepatitis-C.  Elevated AFP. EXAM: CT ABDOMEN WITHOUT AND WITH CONTRAST TECHNIQUE: Multidetector CT imaging of the abdomen was performed following the standard protocol before and following the bolus administration of intravenous contrast. CONTRAST:  171m OMNIPAQUE IOHEXOL 300 MG/ML  SOLN COMPARISON:  09/10/2016 CT abdomen/pelvis.  09/22/2015 MRI abdomen. FINDINGS: Lower chest: No significant pulmonary nodules or acute consolidative airspace disease. Hepatobiliary: Diffusely irregular liver surface compatible with cirrhosis. There is a dominant poorly marginated heterogeneously enhancing 8.6 x 7.7 cm posterior right liver lobe mass centered in segment 7 (series 7/image 33), which is new since the most recent comparison CT study of 09/10/2016. There are numerous (greater than 10) similar-appearing heterogeneously enhancing liver masses scattered throughout the liver, all new since 09/10/2016 CT, generally demonstrating heterogeneous arterial  phase hyperenhancement and portal venous phase washout. Representative segment 5 right liver lobe 2.8 x 2.4 cm mass (series 7/image 47). Representative 2.7 x 2.0 cm segment 2 left liver lobe mass (series 7/image 22). Normal gallbladder with no radiopaque cholelithiasis. No biliary ductal dilatation. Pancreas: Normal, with no mass or duct dilation. Spleen: Normal size. No mass. Adrenals/Urinary Tract: Normal adrenals. No renal stones. No hydronephrosis. Simple 1.2 cm medial upper right renal cyst. Additional subcentimeter hypodense renal cortical lesions scattered in both kidneys are too small to characterize and require no follow-up. Stomach/Bowel: Small hiatal hernia. Otherwise normal nondistended stomach. Visualized small and large bowel is normal caliber, with no bowel wall thickening. Vascular/Lymphatic: Atherosclerotic nonaneurysmal abdominal aorta. Patent portal, splenic, hepatic and renal veins. No pathologically enlarged lymph nodes in the abdomen. Other: No pneumoperitoneum, ascites or focal fluid collection. Musculoskeletal: No aggressive appearing focal osseous lesions. Minimal thoracolumbar spondylosis. IMPRESSION: 1. Cirrhosis. 2. New poorly marginated heterogeneously enhancing dominant 8.6 cm posterior right liver mass centered in segment 7, most compatible with hepatocellular carcinoma. 3. Numerous (greater than 10) additional new enhancing liver masses scattered throughout the liver, most compatible with metastatic/multifocal hepatocellular carcinoma. 4. Normal size spleen.  No ascites. 5. No abdominal adenopathy. 6. Small hiatal hernia. 7.  Aortic Atherosclerosis (ICD10-I70.0). These results will be called to the ordering clinician or representative by the Radiologist Assistant, and communication documented in the PACS or zVision Dashboard. Electronically Signed   By: JIlona SorrelM.D.   On: 02/18/2019 12:03    Assessment and Plan:   1. Hepatocellular carcinoma  02/12/2019 AFP  10,200  02/18/2019 CT abdomen-dominant poorly marginated heterogeneously enhancing 8.6 x 7.7 cm posterior right liver lobe mass, new compared to CT study 09/10/2016.  Numerous similar-appearing liver masses scattered throughout the liver all new since 09/10/2016 CT.  Diffusely irregular liver surface compatible with cirrhosis.  Spleen normal size.  No ascites.  No abdominal adenopathy. 2. 01/20/2019 chest CT-no definite evidence of pulmonary embolus.  Wall thickening of the distal esophagus.  Nodular hepatic contours. 3. Cirrhosis  4. Hepatitis C status post Harvoni 5. Type 2 diabetes 6. COPD 7. CAD 8. Chronic diastolic heart failure 9. Hypertension 10. Positive Cologuard 2019  [name] a 68year old man with cirrhosis and a history of hepatitis C status post Harvoni.  [name] was recently found to have multiple liver lesions.  AFP is markedly elevated.  [name] understands the likely diagnosis is multifocal hepatocellular carcinoma and that no therapy will be curative.  We are referring him for a liver biopsy to confirm the diagnosis.  We discussed hepatic directed therapy and systemic therapy with atezolizumab/Avastin on a 3-week schedule.  We will discuss further when  [name] returns for his appointment after the biopsy.  [name] has a history of a positive Cologuard test and was noted to have esophageal thickening on the chest CT from 01/20/2019.  [name] is scheduled for endoscopy evaluation with Dr. Henrene Pastor on 03/19/2019.  [name] will return for a follow-up visit here on 03/13/2019.  [name] will contact the office in the interim with any problems.  Patient seen with Dr. Benay Spice.  60 minutes were spent face-to-face at today's visit with the majority of that time involved in counseling/coordination of care.  His sister was present by telephone for the above discussion.    Ned Card, NP 02/25/2019, 11:09 AM   This was a shared visit with Ned Card.  [name] was interviewed and examined.  The clinical presentation is  consistent with a diagnosis of hepatocellular carcinoma.  We will refer him for a diagnostic biopsy to confirm the diagnosis given the history of a positive Cologuard test and esophageal thickening.  We reviewed the CT images and gust treatment options.  His sister was present by telephone.  We will likely recommend first-line treatment with atezolizumab/bevacizumab if a diagnosis of metastatic hepatocellular carcinoma is confirmed.  I will present his case at the GI tumor conference.  Julieanne Manson, MD

## 2019-02-25 NOTE — Telephone Encounter (Signed)
Scheduled per los. Gave avs and caledendar

## 2019-02-26 ENCOUNTER — Other Ambulatory Visit: Payer: Self-pay

## 2019-02-26 ENCOUNTER — Encounter: Payer: Self-pay | Admitting: General Practice

## 2019-02-26 NOTE — Patient Outreach (Signed)
[facility]) Care Management  02/26/2019  AERYN HOOPINGARNER [birth year] [id number]   Received message from LCSW, Edwyna Shell to outreach patient again today as he received previous voicemail message that was left for him.  Attempted to call him back but phone went straight to voicemail again.  Will attempt to reach again next week.  [name], BSW Social Worker [phone]

## 2019-02-26 NOTE — Progress Notes (Signed)
[place] CSW Progress Notes  Talked w patient on phone - is concerned about finances.  Is on disability, receives $16/month in [employer].  Feels he cannot afford to buy the food he needs. Goes to [employer] for [employer].  Had been involved with [employer] for their food assistance also. Gets help w heating bill through DSS.  Feels he has to decide between paying for medicine. Aware that [place] from [facility] has called.  Encouraged him to call [facility] CSW to encourage enrollment in their food program.    Problem w food pantries is "I am a Type 2 diabetic and I cant eat all the food I get from pantries."  Cannot eat much carbohydrates or sodium which is what food pantries are able to provide.  Concerned about his A1C.  No concerns re his cancer and nutrition -  Can eat any kind of food that is compatible w his Type 2 diabetes.  Can apply for [facility] small grant for living expenses - will message Annamary Rummage to connect w patient.  Can also enroll in [place] and receive gift card for food/gas at next [facility] visit.  He has been instructed to ask to see social worker when he comes here next.  Edwyna Shell, LCSW Clinical Social Worker Phone:  [phone] Cell:  [phone]

## 2019-02-26 NOTE — Patient Outreach (Signed)
[place]) Care Management  02/26/2019  KREE PRINZ February 16, 1951 [id number]   Attempted to contact patient today regarding social work referral for food insecurity, however patient did not answer.  Left message and mailed unsuccessful outreach letter.  Will attempt to reach again within four business days.  [name], BSW Social Worker [phone]

## 2019-03-02 ENCOUNTER — Encounter (HOSPITAL_COMMUNITY): Payer: Self-pay | Admitting: Radiology

## 2019-03-02 NOTE — Progress Notes (Signed)
[name]. [name] Male, 68 y.o., 1951-02-23 MRN:  [id number] Phone:  [phone] Jerilynn Mages) PCP:  Hoyt Koch, MD Coverage:  [facility] Medicare/[employer] Medicare Hmo Next Appt With Radiology ([facility]-US 2) 03/09/2019 at 1:00 PM  RE: [place] Core Biopsy (liver) Received: 5 days ago Message Contents  Markus Daft, MD  Garth Bigness D  Schedule US guided liver lesion biopsy.   [place]       Previous Messages   ----- Message -----  From: Garth Bigness D  Sent: 02/25/2019  2:09 PM EST  To: Ir Procedure Requests  Subject: [place] Core Biopsy (liver)              Procedure:  [place] Core Biopsy (Liver)   Reason: Cancer, hepatocellular, multiple liver lesions, cirrhosis, h/o hep C   History: CT in computer   Provider: [name], NP   Provider Contact:  [phone]

## 2019-03-03 ENCOUNTER — Other Ambulatory Visit: Payer: Self-pay

## 2019-03-03 ENCOUNTER — Ambulatory Visit: Payer: Self-pay

## 2019-03-03 ENCOUNTER — Other Ambulatory Visit: Payer: Self-pay | Admitting: Nurse Practitioner

## 2019-03-03 DIAGNOSIS — C220 Liver cell carcinoma: Secondary | ICD-10-CM

## 2019-03-03 NOTE — Patient Outreach (Signed)
[place] [facility]) Care Management  03/03/2019  [name] [birth year] [id number]   Second attempt to contact patient regarding social work referral from [employer], Edwyna Shell, for food insecurity.  Patient did not answer; left message. Mailed unsuccessful outreach letter on 02/26/19.  Will make final attempt to reach patient within four business days.  [name], BSW Social Worker [phone]

## 2019-03-04 ENCOUNTER — Other Ambulatory Visit: Payer: Self-pay

## 2019-03-04 ENCOUNTER — Ambulatory Visit: Payer: Self-pay

## 2019-03-04 ENCOUNTER — Telehealth: Payer: Self-pay

## 2019-03-04 NOTE — Patient Outreach (Signed)
[facility] [facility]) Care Management  03/04/2019  [name] 10-Jun-1950 [id number]   Third attempt to contact patient regarding social work referral from [employer], Edwyna Shell, for food insecurity.  Patient did not answer; left message. Mailed unsuccessful outreach letter on 02/26/19. [name]  Per note from Edwyna Shell on 02/26/19 patient receives $16/month in food stamps.  He currently utilizes [employer] and [employer] for food assistance.  Patient reported that problem with using food pantries is that available foods are too high carbohydrates and sodium.    The only resource that I will be able to offer to patient is linking him to [employer] program.  Unfortunately, this is only a 30 day program..  Will discuss this if return call is received.  Will close case if no response to voicemail or letter by 03/13/19   [name], [place] Social Worker [phone]

## 2019-03-04 NOTE — Telephone Encounter (Signed)
TC to pt per Kyle Card NP to let him know that he will be scheduled for  MRI liver with/without contrast, instead of a biopsy. Patient verbalized understanding.

## 2019-03-05 ENCOUNTER — Other Ambulatory Visit: Payer: Self-pay

## 2019-03-05 ENCOUNTER — Ambulatory Visit (HOSPITAL_COMMUNITY)
Admission: RE | Admit: 2019-03-05 | Discharge: 2019-03-05 | Disposition: A | Discharge status: Home / Self Care | Payer: Medicare HMO | Source: Ambulatory Visit | Attending: Nurse Practitioner | Admitting: Nurse Practitioner

## 2019-03-05 ENCOUNTER — Other Ambulatory Visit: Payer: Self-pay | Admitting: Student

## 2019-03-05 ENCOUNTER — Ambulatory Visit: Payer: Medicare HMO

## 2019-03-05 ENCOUNTER — Ambulatory Visit (HOSPITAL_COMMUNITY): Payer: Medicare HMO

## 2019-03-05 DIAGNOSIS — C220 Liver cell carcinoma: Secondary | ICD-10-CM

## 2019-03-05 DIAGNOSIS — K7460 Unspecified cirrhosis of liver: Secondary | ICD-10-CM | POA: Diagnosis not present

## 2019-03-05 MED ORDER — GADOBUTROL 1 MMOL/ML IV SOLN
8.0000 mL | Freq: Once | INTRAVENOUS | Status: AC | PRN
  Administered 2019-03-05: 8 mL via INTRAVENOUS

## 2019-03-09 ENCOUNTER — Ambulatory Visit (HOSPITAL_COMMUNITY): Payer: Medicare HMO

## 2019-03-12 ENCOUNTER — Other Ambulatory Visit: Payer: Self-pay

## 2019-03-12 ENCOUNTER — Telehealth: Payer: Self-pay | Admitting: General Practice

## 2019-03-12 NOTE — Patient Outreach (Signed)
[facility]) Care Management  [place]  03/12/2019   [name] May 15, 1950 [id number]  Subjective: Telephone call to patient. He reports he has a lot going on. He was upset that he paid for transportation for an appointment that he did not need.  Cm apologized for his inconvenience and discussed next steps for his liver cancer treatments.  Patient states he has an appointment tomorrow but due to the weather he is not sure of that.  Advised patient to call the cancer center to make sure that the class is still scheduled.  He states he will.  Asked patient about food.  He states he is good with that thanks to the stimulus check from the government.  However, patient expressed some interest in Medicaid.  Advised him that I would have social work reach out to him again about possible medicaid.  He verbalized understanding.  Patient reports that his sugars are doing good right now and that his last sugar was 135. Discussed diet and advised to continue to keep sugars well controlled.  He verbalized understanding.    Objective:   Encounter Medications:  Outpatient Encounter Medications as of 03/12/2019  Medication Sig Note  . albuterol (VENTOLIN HFA) 108 (90 Base) MCG/ACT inhaler Inhale 1-2 puffs into the lungs every 4 (four) hours as needed for wheezing or shortness of breath. 02/11/2019: On hand  . Alcohol Swabs (B-D SINGLE USE SWABS REGULAR) PADS Use to swab finger 3 times daily.   [name] aspirin EC 81 MG tablet Take 1 tablet (81 mg total) by mouth daily.   . Blood Glucose Monitoring Suppl (ACCU-CHEK GUIDE ME) w/Device KIT USE ONCE DAILY AS NEEDED   . cholecalciferol (VITAMIN D) 1000 UNITS tablet Take 1 tablet (1,000 Units total) by mouth daily. (Patient taking differently: Take 1,000 Units by mouth 2 (two) times daily. )   . Cinnamon 500 MG capsule Take 500 mg by mouth 2 (two) times daily.   [name] glucose blood (ACCU-CHEK GUIDE) test strip 1 each by Other route 4 (four) times daily.    . insulin NPH-regular Human (NOVOLIN 70/30) (70-30) 100 UNIT/ML injection Inject 120 Units into the skin daily with breakfast.   . Lancets (ACCU-CHEK SOFT TOUCH) lancets Use to check your blood sugars 4 times per day, once before each meal and once at bedtime; E11.9   . lisinopril-hydrochlorothiazide (ZESTORETIC) 20-25 MG tablet Take 1 tablet by mouth daily.   . methocarbamol (ROBAXIN) 500 MG tablet Take 1 tablet (500 mg total) by mouth 2 (two) times daily.   . metoCLOPramide (REGLAN) 5 MG tablet Take 1 tablet (5 mg total) by mouth every 8 (eight) hours as needed for nausea.   . Multiple Vitamins-Minerals (MULTIVITAMIN WITH MINERALS) tablet Take 1 tablet by mouth daily.   . Omega-3 Fatty Acids (FISH OIL) 1000 MG CAPS Take 2 capsules by mouth daily.   . pantoprazole (PROTONIX) 40 MG tablet Take 1 tablet (40 mg total) by mouth daily before breakfast.   . sildenafil (REVATIO) 20 MG tablet TAKE 5 TABLETS BY MOUTH DAILY AS NEEDED (Patient not taking: Reported on 02/25/2019)   . sildenafil (VIAGRA) 100 MG tablet Take 0.5-1 tablets (50-100 mg total) by mouth daily as needed for erectile dysfunction. (Patient not taking: Reported on 02/25/2019)   . traZODone (DESYREL) 50 MG tablet Take 1 tablet (50 mg total) by mouth at bedtime as needed for sleep.   [name] umeclidinium-vilanterol (ANORO ELLIPTA) 62.5-25 MCG/INH AEPB Inhale 1 puff into the lungs daily.  NEED OFFICE VISIT FOR FURTHER REFILLS   . [DISCONTINUED] atorvastatin (LIPITOR) 40 MG tablet Take 1 tablet (40 mg total) by mouth daily.    No facility-administered encounter medications on file as of 03/12/2019.    Functional Status:  In your present state of health, do you have any difficulty performing the following activities: 11/20/2018 09/15/2018  Hearing? N N  Vision? N N  Difficulty concentrating or making decisions? N N  Walking or climbing stairs? N N  Dressing or bathing? N N  Doing errands, shopping? N N  Preparing Food and eating ? N N  Using  the Toilet? N N  In the past six months, have you accidently leaked urine? N N  Do you have problems with loss of bowel control? N N  Managing your Medications? N N  Managing your Finances? N N  Housekeeping or managing your Housekeeping? N -  Some recent data might be hidden    Fall/Depression Screening: Fall Risk  09/15/2018 08/29/2018 08/23/2017  Falls in the past year? 0 0 No  Number falls in past yr: - 0 -  Follow up Falls evaluation completed - -   PHQ 2/9 Scores 09/15/2018 09/02/2018 08/29/2018 08/23/2017 06/24/2015 05/10/2015 03/16/2015  PHQ - 2 Score 0 0 1 1 0 0 0  PHQ- 9 Score - - - 2 - - -    Assessment: Patient with new diagnosis of cancer with pending treatments.  Plan:  [facility] CM Care Plan Problem One     Most Recent Value  Care Plan Problem One  Knowledge Deficit in self management of Diabetes  Role Documenting the Problem One  Care Management Telephonic [place] for Problem One  Active  [facility] Long Term Goal   Patient will  verbalize a decrease in A1C from 10.8 within the next 90 days  [place] Term Goal Start Date  03/12/19  Interventions for Problem One Long Term Goal  RN discussed diabetes and blood sugar control.  Patient continues to monitor and record blood sguars.     RN CM will contact again in the month of February and patient agreeable.    [name], RN, MSN [place] Management Care Management Coordinator Direct Line [phone] Cell [phone] Toll Free: [phone]  Fax: [phone]

## 2019-03-12 NOTE — Telephone Encounter (Signed)
[place] CSW Progress Notes  Call from patient - concerned that he may not be able to make appt tomorrow due to weather.  CSW took care of concerns to be addressed by SW visit tomorrow - patient reports no food insecurity at this time.  Has received stimulus check and has all needs met.  Will defer [employer] help until he needs further assistance.  Asked CSW to message Dr Benay Spice - pt ambivalent about cancelling tomorrows appt w oncologist - "I want to go but I am afraid I cannot get there."  Messaged Dr Benay Spice and also spoke w scheduling.  They will determine course of action and speak w patient.  CSW cancelled social work visit scheduled for tomorrow.    Edwyna Shell, LCSW Clinical Social Worker Phone:  [phone]

## 2019-03-13 ENCOUNTER — Other Ambulatory Visit: Payer: Self-pay

## 2019-03-13 ENCOUNTER — Inpatient Hospital Stay: Payer: Medicare HMO | Attending: Nurse Practitioner | Admitting: Oncology

## 2019-03-13 ENCOUNTER — Ambulatory Visit: Payer: Self-pay

## 2019-03-13 ENCOUNTER — Telehealth: Payer: Self-pay | Admitting: *Deleted

## 2019-03-13 ENCOUNTER — Inpatient Hospital Stay: Payer: Medicare HMO

## 2019-03-13 ENCOUNTER — Inpatient Hospital Stay: Payer: Medicare HMO | Admitting: General Practice

## 2019-03-13 VITALS — BP 138/65 | HR 96 | Temp 98.7°F | Resp 17 | Ht 64.0 in | Wt 195.6 lb

## 2019-03-13 DIAGNOSIS — I5032 Chronic diastolic (congestive) heart failure: Secondary | ICD-10-CM | POA: Diagnosis not present

## 2019-03-13 DIAGNOSIS — C220 Liver cell carcinoma: Secondary | ICD-10-CM | POA: Diagnosis not present

## 2019-03-13 DIAGNOSIS — J449 Chronic obstructive pulmonary disease, unspecified: Secondary | ICD-10-CM | POA: Insufficient documentation

## 2019-03-13 DIAGNOSIS — Z5111 Encounter for antineoplastic chemotherapy: Secondary | ICD-10-CM | POA: Diagnosis not present

## 2019-03-13 DIAGNOSIS — K7460 Unspecified cirrhosis of liver: Secondary | ICD-10-CM | POA: Diagnosis not present

## 2019-03-13 DIAGNOSIS — E119 Type 2 diabetes mellitus without complications: Secondary | ICD-10-CM | POA: Insufficient documentation

## 2019-03-13 DIAGNOSIS — B1920 Unspecified viral hepatitis C without hepatic coma: Secondary | ICD-10-CM | POA: Diagnosis not present

## 2019-03-13 DIAGNOSIS — I2510 Atherosclerotic heart disease of native coronary artery without angina pectoris: Secondary | ICD-10-CM | POA: Diagnosis not present

## 2019-03-13 DIAGNOSIS — I110 Hypertensive heart disease with heart failure: Secondary | ICD-10-CM | POA: Insufficient documentation

## 2019-03-13 DIAGNOSIS — E1169 Type 2 diabetes mellitus with other specified complication: Secondary | ICD-10-CM | POA: Diagnosis not present

## 2019-03-13 MED ORDER — PROCHLORPERAZINE MALEATE 10 MG PO TABS: 10.0000 mg | ORAL_TABLET | Freq: Four times a day (QID) | ORAL | 1 refills | Status: DC | PRN

## 2019-03-13 NOTE — Progress Notes (Signed)
START OFF PATHWAY REGIMEN - Other   [id number]:Atezolizumab 1,200 mg IV D1 + Bevacizumab 15 mg/kg IV D1 q21 Days:   A cycle is every 21 Days:     Atezolizumab      Bevacizumab-xxxx   **Always confirm dose/schedule in your pharmacy ordering system**  Patient Characteristics: Intent of Therapy: Non-Curative / Palliative Intent, Discussed with Patient 

## 2019-03-13 NOTE — Patient Outreach (Signed)
[facility]) Care Management  03/13/2019  [name] 07-03-1950 [id number]    Attempted to contact patient several times for food resources as requested by LCSW, Edwyna Shell.  Left messages but did not receive return call.   Patient denied need for food resources during most recent outreach from [place], [place], but he did inquire about applying for Medicaid. Attempted to contact patient today but had to leave message.  Closing [facility] Social Work case but will send in-basket message to Webb Silversmith to assist patient with this during her next outreach or have him contact me.  [name], BSW Social Worker [phone]

## 2019-03-13 NOTE — Progress Notes (Signed)
  [place] OFFICE PROGRESS NOTE   Diagnosis: Hepatocellular carcinoma  INTERVAL HISTORY:   [name] returns as scheduled.  He feels well.  No complaint.  Objective:  Vital signs in last 24 hours:  Blood pressure 138/65, pulse 96, temperature 98.7 F (37.1 C), temperature source Temporal, resp. rate 17, height 5\' 4"  (1.626 m), weight 195 lb 9.6 oz (88.7 kg), SpO2 100 %.    Limited physical examination secondary to distancing with the Covid pandemic GI: No hepatomegaly, no apparent ascites Vascular: No leg edema   Lab Results:  Lab Results  Component Value Date   WBC 9.7 01/20/2019   HGB 14.6 01/20/2019   HCT 44.0 01/20/2019   MCV 81.5 01/20/2019   PLT 339 01/20/2019   NEUTROABS 7.7 05/27/2015    CMP  Lab Results  Component Value Date   NA 131 (L) 02/18/2019   K 4.1 02/18/2019   CL 99 02/18/2019   CO2 22 02/18/2019   GLUCOSE 274 (H) 02/18/2019   BUN 17 02/18/2019   CREATININE 1.05 02/18/2019   CALCIUM 9.2 02/18/2019   PROT 7.8 02/18/2019   ALBUMIN 4.2 02/18/2019   AST 104 (H) 02/18/2019   ALT 47 02/18/2019   ALKPHOS 92 02/18/2019   BILITOT 1.0 02/18/2019   GFRNONAA 60 (L) 01/20/2019   GFRAA >60 01/20/2019    No results found for: CEA1  Lab Results  Component Value Date   INR 1.1 (H) 02/11/2019    Imaging:  No results found.  Medications: I have reviewed the patient's current medications.   Assessment/Plan: 1. Hepatocellular carcinoma  02/12/2019 AFP 10,200  02/18/2019 CT abdomen-dominant poorly marginated heterogeneously enhancing 8.6 x 7.7 cm posterior right liver lobe mass, new compared to CT study 09/10/2016.  Numerous similar-appearing liver masses scattered throughout the liver all new since 09/10/2016 CT.  Diffusely irregular liver surface compatible with cirrhosis.  Spleen normal size.  No ascites.  No abdominal adenopathy.  MRI liver 03/05/2019-multifocal hepatic metastases including a 10 cm lesion in the right liver,  Li-rads 5, no evidence of metastatic disease 2. 01/20/2019 chest CT-no definite evidence of pulmonary embolus.  Wall thickening of the distal esophagus.  Nodular hepatic contours. 3. Cirrhosis  4. Hepatitis C status post Harvoni 5. Type 2 diabetes 6. COPD 7. CAD 8. Chronic diastolic heart failure 9. Hypertension 10. Positive Cologuard 2019    Disposition: [name] has hepatocellular carcinoma.  The history of cirrhosis, MRI findings, and elevated AFP or diagnostic of hepatocellular carcinoma.  I do not recommend a biopsy.  This case was presented at the GI tumor conference earlier this week.  He is a candidate for systemic therapy and hepatic directed therapy.  He has large volume disease involving both sides of the liver.  I recommend treatment with atezolizumab and bevacizumab.  We reviewed potential toxicities associated with this regimen.  He understands the potential for an allergic reaction, rash, hepatitis, pneumonitis, and various autoimmune toxicities associated with atezolizumab.  We discussed the allergic reaction hypertension, bleeding, thromboembolic disease, bowel perforation, CNS toxicity and nephrotoxicity associated with bevacizumab.  He agrees to proceed.  He will attend a chemotherapy teaching class today.  The plan is to begin atezolizumab and Avastin on 03/20/2019.  Betsy Coder, MD  03/13/2019  3:54 PM

## 2019-03-13 NOTE — Telephone Encounter (Signed)
Called to report he does not see any of his visit information on Mychart yet. Also asking about surgery he is having next week? Does he need the colonoscopy next week? Per Dr. Benay Spice : He is having a treatment and not surgery. Does not need the colonoscopy at this time and patient needs to call GI to cancel. Informed of Dr. Gearldine Shown answers above. He will call GI. Informed him the scheduler will call with his treatment appointment next week. Treatment needs to have insurance approval beforehand. Dr. Benay Spice has not yet completed his office dictation today, thus the reason nothing is in Mychart right now.

## 2019-03-15 ENCOUNTER — Other Ambulatory Visit: Payer: Self-pay | Admitting: Oncology

## 2019-03-16 ENCOUNTER — Ambulatory Visit: Payer: Self-pay

## 2019-03-18 ENCOUNTER — Telehealth: Payer: Self-pay | Admitting: *Deleted

## 2019-03-18 ENCOUNTER — Other Ambulatory Visit: Payer: Self-pay

## 2019-03-18 DIAGNOSIS — Z006 Encounter for examination for normal comparison and control in clinical research program: Secondary | ICD-10-CM

## 2019-03-18 NOTE — Telephone Encounter (Addendum)
[employer] 2018-01 Blood Sample collection to Evaluate Biomarkers in Subjects with Untreated Solid Tumors; Dr. Benay Spice referred patient for the above study.  Called patient to provide him with the information for this study.  Informed patient that participation is voluntary. Informed patient briefly of the purpose and possible risks and benefits of the study. Explained the study collects 5 tubes of blood prior to any cancer treatment. They also ask demographic and health and medication questions. Informed patient that participants receive a $50 [employer] gift card after they donate blood and information. Informed patient if he is interested then we would need to collect the research blood prior to starting chemotherapy which is planned to start this Friday 03/20/19. Patient states he is definitely interested in participating. He agreed to come in at 12 noon to meet with research nurse before his lab and treatment appointments.  Confirmed with patient that he is not having colonoscopy tomorrow as scheduled. He states he called Amber at the GI office to cancel this procedure.  Thanked patient for his time and willingness to consider this study.  Eligibility reviewed by this research nurse completed and patient meets inclusion and none of the exclusion criteria to be enrolled on this study.  Foye Spurling, BSN, RN Clinical Research Nurse 03/18/2019 10:43 AM

## 2019-03-18 NOTE — Patient Outreach (Signed)
[facility]) Care Management  03/18/2019  BRANDEL MESNER 21-Nov-1950 [id number]   Incoming call from patient in response to voicemail messages left.  Patient was originally referred to [place] Work due to food insecurity, however, he states today that he received stimulus check and has sufficient amount of food.  Patient is interested in applying for Medicaid and requested that application be mailed to him.  Will follow up within the next two weeks to ensure receipt.  [name], BSW Social Worker [phone]

## 2019-03-19 ENCOUNTER — Encounter: Payer: Medicare HMO | Admitting: Internal Medicine

## 2019-03-20 ENCOUNTER — Inpatient Hospital Stay: Payer: Medicare HMO

## 2019-03-20 ENCOUNTER — Inpatient Hospital Stay: Payer: Medicare HMO | Admitting: *Deleted

## 2019-03-20 ENCOUNTER — Other Ambulatory Visit: Payer: Self-pay

## 2019-03-20 ENCOUNTER — Encounter: Payer: Self-pay | Admitting: *Deleted

## 2019-03-20 VITALS — BP 157/75 | HR 84 | Temp 98.7°F | Resp 16

## 2019-03-20 DIAGNOSIS — I2510 Atherosclerotic heart disease of native coronary artery without angina pectoris: Secondary | ICD-10-CM | POA: Diagnosis not present

## 2019-03-20 DIAGNOSIS — C220 Liver cell carcinoma: Secondary | ICD-10-CM

## 2019-03-20 DIAGNOSIS — B1920 Unspecified viral hepatitis C without hepatic coma: Secondary | ICD-10-CM | POA: Diagnosis not present

## 2019-03-20 DIAGNOSIS — E119 Type 2 diabetes mellitus without complications: Secondary | ICD-10-CM | POA: Diagnosis not present

## 2019-03-20 DIAGNOSIS — J449 Chronic obstructive pulmonary disease, unspecified: Secondary | ICD-10-CM | POA: Diagnosis not present

## 2019-03-20 DIAGNOSIS — I110 Hypertensive heart disease with heart failure: Secondary | ICD-10-CM | POA: Diagnosis not present

## 2019-03-20 DIAGNOSIS — Z006 Encounter for examination for normal comparison and control in clinical research program: Secondary | ICD-10-CM

## 2019-03-20 DIAGNOSIS — Z5111 Encounter for antineoplastic chemotherapy: Secondary | ICD-10-CM | POA: Diagnosis not present

## 2019-03-20 DIAGNOSIS — K7460 Unspecified cirrhosis of liver: Secondary | ICD-10-CM | POA: Diagnosis not present

## 2019-03-20 DIAGNOSIS — I5032 Chronic diastolic (congestive) heart failure: Secondary | ICD-10-CM | POA: Diagnosis not present

## 2019-03-20 LAB — CMP (CANCER CENTER ONLY)
ALT: 55 U/L — ABNORMAL HIGH (ref 0–44)
AST: 62 U/L — ABNORMAL HIGH (ref 15–41)
Albumin: 3.8 g/dL (ref 3.5–5.0)
Alkaline Phosphatase: 131 U/L — ABNORMAL HIGH (ref 38–126)
Anion gap: 11 (ref 5–15)
BUN: 17 mg/dL (ref 8–23)
CO2: 22 mmol/L (ref 22–32)
Calcium: 8.9 mg/dL (ref 8.9–10.3)
Chloride: 101 mmol/L (ref 98–111)
Creatinine: 1.15 mg/dL (ref 0.61–1.24)
GFR, Est AFR Am: >60 mL/min (ref >60)
GFR, Estimated: >60 mL/min (ref >60)
Glucose, Bld: 257 mg/dL — ABNORMAL HIGH (ref 70–99)
Potassium: 3.8 mmol/L (ref 3.5–5.1)
Sodium: 134 mmol/L — ABNORMAL LOW (ref 135–145)
Total Bilirubin: 0.5 mg/dL (ref 0.3–1.2)
Total Protein: 8 g/dL (ref 6.5–8.1)

## 2019-03-20 LAB — CBC WITH DIFFERENTIAL (CANCER CENTER ONLY)
Abs Immature Granulocytes: 0.02 10*3/uL (ref 0.00–0.07)
Basophils Absolute: 0.1 10*3/uL (ref 0.0–0.1)
Basophils Relative: 1 %
Eosinophils Absolute: 0.1 10*3/uL (ref 0.0–0.5)
Eosinophils Relative: 2 %
HCT: 37 % — ABNORMAL LOW (ref 39.0–52.0)
Hemoglobin: 12.4 g/dL — ABNORMAL LOW (ref 13.0–17.0)
Immature Granulocytes: 0 %
Lymphocytes Relative: 37 %
Lymphs Abs: 2.6 10*3/uL (ref 0.7–4.0)
MCH: 27.4 pg (ref 26.0–34.0)
MCHC: 33.5 g/dL (ref 30.0–36.0)
MCV: 81.9 fL (ref 80.0–100.0)
Monocytes Absolute: 0.6 10*3/uL (ref 0.1–1.0)
Monocytes Relative: 9 %
Neutro Abs: 3.7 10*3/uL (ref 1.7–7.7)
Neutrophils Relative %: 51 %
Platelet Count: 308 10*3/uL (ref 150–400)
RBC: 4.52 MIL/uL (ref 4.22–5.81)
RDW: 15.1 % (ref 11.5–15.5)
WBC Count: 7.1 10*3/uL (ref 4.0–10.5)
nRBC: 0 % (ref 0.0–0.2)

## 2019-03-20 LAB — RESEARCH LABS

## 2019-03-20 MED ORDER — SODIUM CHLORIDE 0.9 % IV SOLN
15.0000 mg/kg | Freq: Once | INTRAVENOUS | Status: AC
  Administered 2019-03-20: 1300 mg via INTRAVENOUS
  Filled 2019-03-20: qty 48

## 2019-03-20 MED ORDER — SODIUM CHLORIDE 0.9 % IV SOLN
1200.0000 mg | Freq: Once | INTRAVENOUS | Status: AC
  Administered 2019-03-20: 1200 mg via INTRAVENOUS
  Filled 2019-03-20: qty 20

## 2019-03-20 MED ORDER — SODIUM CHLORIDE 0.9 % IV SOLN
Freq: Once | INTRAVENOUS | Status: AC
  Filled 2019-03-20: qty 250

## 2019-03-20 NOTE — Research (Signed)
03/20/2019 at 12:45pm - [employer] consent study notes- The pt was into the [facility] this afternoon to meet with the research nurse and for his first chemo administration.  The research nurse met with the pt for 40 minutes going over the consent form and the hipaa authorization.  The research nurse read the entire consent form to the pt.  All of the pt's questions were answered.  The pt stated that he wanted to participate in the study.  The pt signed the consent form at 12:22pm, and the hipaa authorization form at 12:24pm.  The pt was provided with a copy of his signed forms for his future reference. The research nurse reviewed the inclusion/exclusion criteria with the pt.  The pt was deemed eligible for study enrollment (2nd nurse review).  Foye Spurling, Nutritional therapist, completed the first review of eligibility for this pt.  The research nurse asked the pt about his family history, tobacco history, and his alcohol consumption history.  For some of the questions, the pt was not able to provide an answer (grand-daughter's type of cancer and the average number of alcoholic beverages subject consumed per week).  Dr. Benay Spice confirmed that the pt met all inclusion/exclusion criteria to enroll in this trial.  The pt was taken to the lab and his samples were drawn for the research study.  The pt was given his $69 [employer] gift card.  The pt was thanked for his participation in the study.  Brion Aliment RN, BSN, CCRP  Clinical Research Nurse 03/20/2019 1:02 PM

## 2019-03-20 NOTE — Patient Instructions (Addendum)
[place] Discharge Instructions for Patients Receiving Chemotherapy  Today you received the following chemotherapy agents (see below)  To help prevent nausea and vomiting after your treatment, we encourage you to take your nausea medication as directed.   If you develop nausea and vomiting that is not controlled by your nausea medication, call the clinic.   BELOW ARE SYMPTOMS THAT SHOULD BE REPORTED IMMEDIATELY:  *FEVER GREATER THAN 100.5 F  *CHILLS WITH OR WITHOUT FEVER  NAUSEA AND VOMITING THAT IS NOT CONTROLLED WITH YOUR NAUSEA MEDICATION  *UNUSUAL SHORTNESS OF BREATH  *UNUSUAL BRUISING OR BLEEDING  TENDERNESS IN MOUTH AND THROAT WITH OR WITHOUT PRESENCE OF ULCERS  *URINARY PROBLEMS  *BOWEL PROBLEMS  UNUSUAL RASH Items with * indicate a potential emergency and should be followed up as soon as possible.  Feel free to call the clinic should you have any questions or concerns. The clinic phone number is [phone].  Please show the [place] at check-in to the Emergency Department and triage nurse.    Bevacizumab injection What is this medicine? BEVACIZUMAB (be va SIZ yoo mab) is a monoclonal antibody. It is used to treat many types of cancer. This medicine may be used for other purposes; ask your health care provider or pharmacist if you have questions. COMMON BRAND NAME(S): Avastin, MVASI, Zirabev What should I tell my health care provider before I take this medicine? They need to know if you have any of these conditions:  diabetes  heart disease  high blood pressure  history of coughing up blood  prior anthracycline chemotherapy (e.g., doxorubicin, daunorubicin, epirubicin)  recent or ongoing radiation therapy  recent or planning to have surgery  stroke  an unusual or allergic reaction to bevacizumab, hamster proteins, mouse proteins, other medicines, foods, dyes, or preservatives  pregnant or trying to get  pregnant  breast-feeding How should I use this medicine? This medicine is for infusion into a vein. It is given by a health care professional in a hospital or clinic setting. Talk to your pediatrician regarding the use of this medicine in children. Special care may be needed. Overdosage: If you think you have taken too much of this medicine contact a poison control center or emergency room at once. NOTE: This medicine is only for you. Do not share this medicine with others. What if I miss a dose? It is important not to miss your dose. Call your doctor or health care professional if you are unable to keep an appointment. What may interact with this medicine? Interactions are not expected. This list may not describe all possible interactions. Give your health care provider a list of all the medicines, herbs, non-prescription drugs, or dietary supplements you use. Also tell them if you smoke, drink alcohol, or use illegal drugs. Some items may interact with your medicine. What should I watch for while using this medicine? Your condition will be monitored carefully while you are receiving this medicine. You will need important blood work and urine testing done while you are taking this medicine. This medicine may increase your risk to bruise or bleed. Call your doctor or health care professional if you notice any unusual bleeding. Before having surgery, talk to your health care provider to make sure it is ok. This drug can increase the risk of poor healing of your surgical site or wound. You will need to stop this drug for 28 days before surgery. After surgery, wait at least 28 days before restarting this drug. Make  sure the surgical site or wound is healed enough before restarting this drug. Talk to your health care provider if questions. Do not become pregnant while taking this medicine or for 6 months after stopping it. Women should inform their doctor if they wish to become pregnant or think they  might be pregnant. There is a potential for serious side effects to an unborn child. Talk to your health care professional or pharmacist for more information. Do not breast-feed an infant while taking this medicine and for 6 months after the last dose. This medicine has caused ovarian failure in some women. This medicine may interfere with the ability to have a child. You should talk to your doctor or health care professional if you are concerned about your fertility. What side effects may I notice from receiving this medicine? Side effects that you should report to your doctor or health care professional as soon as possible:  allergic reactions like skin rash, itching or hives, swelling of the face, lips, or tongue  chest pain or chest tightness  chills  coughing up blood  high fever  seizures  severe constipation  signs and symptoms of bleeding such as bloody or black, tarry stools; red or dark-brown urine; spitting up blood or brown material that looks like coffee grounds; red spots on the skin; unusual bruising or bleeding from the eye, gums, or nose  signs and symptoms of a blood clot such as breathing problems; chest pain; severe, sudden headache; pain, swelling, warmth in the leg  signs and symptoms of a stroke like changes in vision; confusion; trouble speaking or understanding; severe headaches; sudden numbness or weakness of the face, arm or leg; trouble walking; dizziness; loss of balance or coordination  stomach pain  sweating  swelling of legs or ankles  vomiting  weight gain Side effects that usually do not require medical attention (report to your doctor or health care professional if they continue or are bothersome):  back pain  changes in taste  decreased appetite  dry skin  nausea  tiredness This list may not describe all possible side effects. Call your doctor for medical advice about side effects. You may report side effects to FDA at  1-800-FDA-1088. Where should I keep my medicine? This drug is given in a hospital or clinic and will not be stored at home. NOTE: This sheet is a summary. It may not cover all possible information. If you have questions about this medicine, talk to your doctor, pharmacist, or health care provider.  2020 Elsevier/Gold Standard (2018-12-17 10:50:46)  Atezolizumab injection What is this medicine? ATEZOLIZUMAB (a te zoe LIZ ue mab) is a monoclonal antibody. It is used to treat bladder cancer (urothelial cancer), liver cancer, lung cancer, breast cancer, and melanoma. This medicine may be used for other purposes; ask your health care provider or pharmacist if you have questions. COMMON BRAND NAME(S): Tecentriq What should I tell my health care provider before I take this medicine? They need to know if you have any of these conditions:  diabetes  immune system problems  infection  inflammatory bowel disease  liver disease  lung or breathing disease  lupus  nervous system problems like myasthenia gravis or Guillain-Barre syndrome  organ transplant  an unusual or allergic reaction to atezolizumab, other medicines, foods, dyes, or preservatives  pregnant or trying to get pregnant  breast-feeding How should I use this medicine? This medicine is for infusion into a vein. It is given by a health care professional in  a hospital or clinic setting. A special MedGuide will be given to you before each treatment. Be sure to read this information carefully each time. Talk to your pediatrician regarding the use of this medicine in children. Special care may be needed. Overdosage: If you think you have taken too much of this medicine contact a poison control center or emergency room at once. NOTE: This medicine is only for you. Do not share this medicine with others. What if I miss a dose? It is important not to miss your dose. Call your doctor or health care professional if you are unable to  keep an appointment. What may interact with this medicine? Interactions have not been studied. This list may not describe all possible interactions. Give your health care provider a list of all the medicines, herbs, non-prescription drugs, or dietary supplements you use. Also tell them if you smoke, drink alcohol, or use illegal drugs. Some items may interact with your medicine. What should I watch for while using this medicine? Your condition will be monitored carefully while you are receiving this medicine. You may need blood work done while you are taking this medicine. Do not become pregnant while taking this medicine or for at least 5 months after stopping it. Women should inform their doctor if they wish to become pregnant or think they might be pregnant. There is a potential for serious side effects to an unborn child. Talk to your health care professional or pharmacist for more information. Do not breast-feed an infant while taking this medicine or for at least 5 months after the last dose. What side effects may I notice from receiving this medicine? Side effects that you should report to your doctor or health care professional as soon as possible:  allergic reactions like skin rash, itching or hives, swelling of the face, lips, or tongue  black, tarry stools  bloody or watery diarrhea  breathing problems  changes in vision  chest pain or chest tightness  chills  facial flushing  fever  headache  signs and symptoms of high blood sugar such as dizziness; dry mouth; dry skin; fruity breath; nausea; stomach pain; increased hunger or thirst; increased urination  signs and symptoms of liver injury like dark yellow or brown urine; general ill feeling or flu-like symptoms; light-colored stools; loss of appetite; nausea; right upper belly pain; unusually weak or tired; yellowing of the eyes or skin  stomach pain  trouble passing urine or change in the amount of urine Side  effects that usually do not require medical attention (report to your doctor or health care professional if they continue or are bothersome):  bone pain  cough  diarrhea  joint pain  muscle pain  muscle weakness  swelling of arms or legs  tiredness  weight loss This list may not describe all possible side effects. Call your doctor for medical advice about side effects. You may report side effects to FDA at 1-800-FDA-1088. Where should I keep my medicine? This drug is given in a hospital or clinic and will not be stored at home. NOTE: This sheet is a summary. It may not cover all possible information. If you have questions about this medicine, talk to your doctor, pharmacist, or health care provider.  2020 Elsevier/Gold Standard (2018-10-10 13:11:14)

## 2019-03-20 NOTE — Progress Notes (Signed)
Per Dr. Benay Spice, proceed with bevacizumab without urine protein. Will start collecting for future appts.

## 2019-03-20 NOTE — Progress Notes (Signed)
Per Dr. Benay Spice: OK to treat today w/CMP from 03/04/19 and CBC is not needed today,.

## 2019-03-21 ENCOUNTER — Other Ambulatory Visit: Payer: Self-pay | Admitting: Internal Medicine

## 2019-03-23 ENCOUNTER — Telehealth: Payer: Self-pay | Admitting: *Deleted

## 2019-03-23 NOTE — Telephone Encounter (Signed)
Called pt to see how he did with his treatment last week.  He reports doing well other than a little sleepy.  He denies any other problems.  He asked about lab results that he sees on MyChart.  Reviewed with pt.  He knows to call with further questions/concerns.

## 2019-03-23 NOTE — Telephone Encounter (Signed)
-----   Message from Bo Mcclintock, RN sent at 03/20/2019  1:24 PM EST ----- Regarding: Dr Benay Spice 1st chemo follow-up First chemo follow-up

## 2019-03-24 ENCOUNTER — Encounter: Payer: Self-pay | Admitting: *Deleted

## 2019-03-24 NOTE — Progress Notes (Signed)
[employer] 2018-01 Blood Sample collection to Evaluate Biomarkers in Subjects with Untreated Solid Tumors; Blood Collection Kit #[id number] was used to collect research samples for this study on 03/20/19.  Blood was drawn by fresh venipuncture.  Foye Spurling, BSN, [place]in [place]

## 2019-03-30 ENCOUNTER — Other Ambulatory Visit: Payer: Self-pay

## 2019-03-30 NOTE — Patient Outreach (Signed)
[facility]) Care Management  03/30/2019  JERAL BYINGTON 25-Apr-1950 [id number]   Successful follow up call to patient today, however, he has not received Medicaid application that was mailed on 03/18/19.  Will mail again today.  Patient has upcoming appointment at [facility] on 04/10/19.  Encouraged him to ask CSW, Edwyna Shell, about getting copy of application if he has not received it by then.  [name], BSW Social Worker [phone]

## 2019-04-02 ENCOUNTER — Ambulatory Visit: Payer: Medicare HMO | Admitting: Endocrinology

## 2019-04-02 ENCOUNTER — Telehealth: Payer: Self-pay | Admitting: General Practice

## 2019-04-02 NOTE — Telephone Encounter (Signed)
[place] CSW Progress Notes  Patient referred by [facility] CSW A Chrisman, wants paper copy of Medicaid application at next [facility] visit.  Asked L White, his Estate manager/land agent, to provide that at his next visit.  Left VM w patient encouraging him to ask for this application at next visit - needs to returned to [place] for processing, patient advised of this process.  Edwyna Shell, LCSW Clinical Social Worker Phone:  [phone]

## 2019-04-05 ENCOUNTER — Other Ambulatory Visit: Payer: Self-pay | Admitting: Oncology

## 2019-04-06 ENCOUNTER — Other Ambulatory Visit: Payer: Self-pay

## 2019-04-08 ENCOUNTER — Ambulatory Visit: Payer: Medicare HMO | Admitting: Endocrinology

## 2019-04-08 ENCOUNTER — Other Ambulatory Visit: Payer: Self-pay

## 2019-04-08 ENCOUNTER — Encounter: Payer: Self-pay | Admitting: Endocrinology

## 2019-04-08 VITALS — BP 122/76 | HR 94 | Ht 64.0 in | Wt 193.4 lb

## 2019-04-08 DIAGNOSIS — E119 Type 2 diabetes mellitus without complications: Secondary | ICD-10-CM | POA: Diagnosis not present

## 2019-04-08 DIAGNOSIS — Z794 Long term (current) use of insulin: Secondary | ICD-10-CM

## 2019-04-08 LAB — POCT GLYCOSYLATED HEMOGLOBIN (HGB A1C): Hemoglobin A1C: 9.6 % — AB (ref 4.0–5.6)

## 2019-04-08 MED ORDER — NOVOLIN 70/30 (70-30) 100 UNIT/ML ~~LOC~~ SUSP: 130.0000 [IU] | Freq: Every day | SUBCUTANEOUS | 3 refills | Status: DC

## 2019-04-08 NOTE — Patient Instructions (Addendum)
check your blood sugar 4 times a day: before the 3 meals, and at bedtime.  also check if you have symptoms of your blood sugar being too high or too low.  please keep a record of the readings and bring it to your next appointment here (or you can bring the meter itself).  You can write it on any piece of paper.  please call us sooner if your blood sugar goes below 70, or if you have a lot of readings over 200. Please increase the insulin to 130 units each morning.   On this type of insulin schedule, you should eat meals on a regular schedule (especiallly lunch).  If a meal is missed or significantly delayed, your blood sugar could go low.   Please come back for a follow-up appointment in 2 months.

## 2019-04-08 NOTE — Progress Notes (Signed)
Subjective:    Patient ID: [name], male    DOB: 1950-11-22, 69 y.o.   MRN: [id number]  HPI Pt returns for f/u of diabetes mellitus: DM type: Insulin-requiring type 2.  Dx'ed: 4081 Complications: PAD and renal insuff Therapy: insulin since 2016.   DKA: never Severe hypoglycemia: never.  Pancreatitis: never.   SDOH: he was advised to see CDE to review insulin injections, but he did not go.  Other: he declines multiple daily injections; he is chronically off and on prednisone, for asthma; he changed lantus to NPH, due to pattern of cbg's; ins declines levemir.   Interval history: cbg varies from 87-200's.  No recent steroids.  He says he never misses the insulin.  She has mild hypoglycemia in the afternoon.  This happens approx once per month, after he misses lunch.  Past Medical History:  Diagnosis Date  . Alcoholism ([place])   . Arthritis    "knees, hands" (03/08/2015)  . Asthma   . Chronic bronchitis ([place])   . Chronic diastolic CHF (congestive heart failure) ([place])    notes 03/08/2015  . Cirrhosis ([place])   . COPD (chronic obstructive pulmonary disease) ([place])   . Coronary artery disease   . Hepatitis C   . High cholesterol   . Hypertension   . Pneumonia 03/2015  . Shortness of breath dyspnea   . Type II diabetes mellitus ([place]) 2014    Past Surgical History:  Procedure Laterality Date  . CATARACT EXTRACTION Left 02/2014  . ESOPHAGOGASTRODUODENOSCOPY  02/09/2011   Procedure: ESOPHAGOGASTRODUODENOSCOPY (EGD);  Surgeon: Missy Sabins, MD;  Location: [facility] ENDOSCOPY;  Service: Endoscopy;  Laterality: N/A;  . 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 Marital status: Divorced    Spouse name: Not on file  . Number of children: 4  . Years of education: Not on file  . Highest education level: Not on file  Occupational History  . Occupation: disabled  Tobacco Use  . Smoking status: Former Smoker    Packs/day: 1.50    Years: 46.00   Pack years: 69.00    Types: Cigarettes    Quit date: 03/19/2013    Years since quitting: 6.0  . Smokeless tobacco: Never Used  Substance and Sexual Activity  . Alcohol use: No    Alcohol/week: 0.0 standard drinks    Comment: "recovering alcoholic; 4/48/1856"  . Drug use: No  . Sexual activity: Not Currently  Other Topics Concern  . Not on file  Social History Narrative  . Not on file   Social Determinants of Health   Financial Resource Strain:   . Difficulty of Paying Living Expenses: Not on file  Food Insecurity:   . Worried About Charity fundraiser in the Last Year: Not on file  . Ran Out of Food in the Last Year: Not on file  Transportation Needs:   . Lack of Transportation (Medical): Not on file  . Lack of Transportation (Non-Medical): Not on file  Physical Activity: Unknown  . Days of Exercise per Week: Not on file  . Minutes of Exercise per Session: 40 min  Stress:   . Feeling of Stress : Not on file  Social Connections:   . Frequency of Communication with Friends and Family: Not on file  . Frequency of Social Gatherings with Friends and Family: Not on file  . Attends Religious Services: Not on file  . Active Member of Clubs or Organizations: Not  on file  . Attends Archivist Meetings: Not on file  . Marital Status: Not on file  Intimate Partner Violence:   . Fear of Current or Ex-Partner: Not on file  . Emotionally Abused: Not on file  . Physically Abused: Not on file  . Sexually Abused: Not on file    Current Outpatient Medications on File Prior to Visit  Medication Sig Dispense Refill  . albuterol (VENTOLIN HFA) 108 (90 Base) MCG/ACT inhaler Inhale 1-2 puffs into the lungs every 4 (four) hours as needed for wheezing or shortness of breath. (Patient not taking: Reported on 04/10/2019) 54 each 3  . Alcohol Swabs (B-D SINGLE USE SWABS REGULAR) PADS Use to swab finger 3 times daily. 300 each 0  . aspirin EC 81 MG tablet Take 1 tablet (81 mg total) by  mouth daily. 30 tablet 3  . Blood Glucose Monitoring Suppl (ACCU-CHEK GUIDE ME) w/Device KIT USE ONCE DAILY AS NEEDED 1 kit 0  . cholecalciferol (VITAMIN D) 1000 UNITS tablet Take 1 tablet (1,000 Units total) by mouth daily. (Patient taking differently: Take 1,000 Units by mouth 2 (two) times daily. ) 30 tablet 0  . Cinnamon 500 MG capsule Take 500 mg by mouth 2 (two) times daily.    [name] glucose blood (ACCU-CHEK GUIDE) test strip 1 each by Other route 4 (four) times daily. 120 each 2  . hydrochlorothiazide (HYDRODIURIL) 25 MG tablet Take 1 tablet by mouth once daily 90 tablet 0  . Lancets (ACCU-CHEK SOFT TOUCH) lancets Use to check your blood sugars 4 times per day, once before each meal and once at bedtime; E11.9 100 each 12  . lisinopril-hydrochlorothiazide (ZESTORETIC) 20-25 MG tablet Take 1 tablet by mouth daily. 90 tablet 3  . methocarbamol (ROBAXIN) 500 MG tablet Take 1 tablet (500 mg total) by mouth 2 (two) times daily. 20 tablet 0  . metoCLOPramide (REGLAN) 5 MG tablet Take 1 tablet (5 mg total) by mouth every 8 (eight) hours as needed for nausea. 60 tablet 0  . Multiple Vitamins-Minerals (MULTIVITAMIN WITH MINERALS) tablet Take 1 tablet by mouth daily.    . Omega-3 Fatty Acids (FISH OIL) 1000 MG CAPS Take 2 capsules by mouth daily.    . pantoprazole (PROTONIX) 40 MG tablet Take 1 tablet (40 mg total) by mouth daily before breakfast. 30 tablet 11  . prochlorperazine (COMPAZINE) 10 MG tablet Take 1 tablet (10 mg total) by mouth every 6 (six) hours as needed. (Patient not taking: Reported on 04/10/2019) 60 tablet 1  . sildenafil (REVATIO) 20 MG tablet TAKE 5 TABLETS BY MOUTH DAILY AS NEEDED 10 tablet 6  . sildenafil (VIAGRA) 100 MG tablet Take 0.5-1 tablets (50-100 mg total) by mouth daily as needed for erectile dysfunction. 5 tablet 3  . traZODone (DESYREL) 50 MG tablet Take 1 tablet (50 mg total) by mouth at bedtime as needed for sleep. 90 tablet 3  . umeclidinium-vilanterol (ANORO ELLIPTA)  62.5-25 MCG/INH AEPB Inhale 1 puff into the lungs daily. NEED OFFICE VISIT FOR FURTHER REFILLS 180 each 0  . [DISCONTINUED] atorvastatin (LIPITOR) 40 MG tablet Take 1 tablet (40 mg total) by mouth daily. 90 tablet 3   No current facility-administered medications on file prior to visit.    Allergies  Allergen Reactions  . Crestor [Rosuvastatin] Other (See Comments)    INTOLERANCE   . Metformin And Related Other (See Comments)    Stomach ache  . Mobic [Meloxicam] Rash    Family History  Problem Relation Age  of Onset  . Stroke Mother   . Diabetes Mother   . Hypertension Mother   . Hyperlipidemia Mother   . Heart disease Father   . Hypertension Father   . Heart attack Father     BP 122/76 (BP Location: Left Arm, Patient Position: Sitting, Cuff Size: Large)   Pulse 94   Ht '5\' 4"'  (1.626 m)   Wt 193 lb 6.4 oz (87.7 kg)   SpO2 98%   BMI 33.20 kg/m    Review of Systems He denies LOC.     Objective:   Physical Exam VITAL SIGNS:  See vs page GENERAL: no distress Pulses: dorsalis pedis intact bilat.   MSK: no deformity of the feet CV: no leg edema Skin:  no ulcer on the feet.  normal color and temp on the feet. Neuro: sensation is intact to touch on the feet.    Lab Results  Component Value Date   HGBA1C 9.6 (A) 04/08/2019       Assessment & Plan:  Insulin-requiring type 2 DM, with PAD: he needs increased rx.  Hepatocellular carcinoma: he is not a candidate for aggressive glycemic control.  Hypoglycemia: this limits aggressiveness of glycemic control.  We have to increase insulin slowly.   Patient Instructions  check your blood sugar 4 times a day: before the 3 meals, and at bedtime.  also check if you have symptoms of your blood sugar being too high or too low.  please keep a record of the readings and bring it to your next appointment here (or you can bring the meter itself).  You can write it on any piece of paper.  please call us sooner if your blood sugar goes  below 70, or if you have a lot of readings over 200. Please increase the insulin to 130 units each morning.   On this type of insulin schedule, you should eat meals on a regular schedule (especiallly lunch).  If a meal is missed or significantly delayed, your blood sugar could go low.   Please come back for a follow-up appointment in 2 months.

## 2019-04-09 DIAGNOSIS — H04123 Dry eye syndrome of bilateral lacrimal glands: Secondary | ICD-10-CM | POA: Diagnosis not present

## 2019-04-09 DIAGNOSIS — Z961 Presence of intraocular lens: Secondary | ICD-10-CM | POA: Diagnosis not present

## 2019-04-09 DIAGNOSIS — H40013 Open angle with borderline findings, low risk, bilateral: Secondary | ICD-10-CM | POA: Diagnosis not present

## 2019-04-09 DIAGNOSIS — E119 Type 2 diabetes mellitus without complications: Secondary | ICD-10-CM | POA: Diagnosis not present

## 2019-04-09 LAB — HM DIABETES EYE EXAM

## 2019-04-10 ENCOUNTER — Inpatient Hospital Stay: Payer: Medicare HMO

## 2019-04-10 ENCOUNTER — Other Ambulatory Visit: Payer: Self-pay

## 2019-04-10 ENCOUNTER — Inpatient Hospital Stay: Payer: Medicare HMO | Admitting: General Practice

## 2019-04-10 ENCOUNTER — Encounter: Payer: Self-pay | Admitting: *Deleted

## 2019-04-10 ENCOUNTER — Telehealth: Payer: Self-pay | Admitting: General Practice

## 2019-04-10 ENCOUNTER — Inpatient Hospital Stay: Payer: Medicare HMO | Attending: Nurse Practitioner | Admitting: Nurse Practitioner

## 2019-04-10 ENCOUNTER — Encounter: Payer: Self-pay | Admitting: Nurse Practitioner

## 2019-04-10 VITALS — BP 148/70 | HR 92 | Temp 98.9°F | Resp 18 | Ht 64.0 in | Wt 195.9 lb

## 2019-04-10 DIAGNOSIS — Z5112 Encounter for antineoplastic immunotherapy: Secondary | ICD-10-CM | POA: Insufficient documentation

## 2019-04-10 DIAGNOSIS — I110 Hypertensive heart disease with heart failure: Secondary | ICD-10-CM | POA: Insufficient documentation

## 2019-04-10 DIAGNOSIS — B1920 Unspecified viral hepatitis C without hepatic coma: Secondary | ICD-10-CM | POA: Diagnosis not present

## 2019-04-10 DIAGNOSIS — C220 Liver cell carcinoma: Secondary | ICD-10-CM | POA: Diagnosis not present

## 2019-04-10 DIAGNOSIS — I2510 Atherosclerotic heart disease of native coronary artery without angina pectoris: Secondary | ICD-10-CM | POA: Diagnosis not present

## 2019-04-10 DIAGNOSIS — K7460 Unspecified cirrhosis of liver: Secondary | ICD-10-CM | POA: Insufficient documentation

## 2019-04-10 DIAGNOSIS — J449 Chronic obstructive pulmonary disease, unspecified: Secondary | ICD-10-CM | POA: Diagnosis not present

## 2019-04-10 DIAGNOSIS — I5032 Chronic diastolic (congestive) heart failure: Secondary | ICD-10-CM | POA: Diagnosis not present

## 2019-04-10 DIAGNOSIS — E119 Type 2 diabetes mellitus without complications: Secondary | ICD-10-CM | POA: Insufficient documentation

## 2019-04-10 LAB — CBC WITH DIFFERENTIAL (CANCER CENTER ONLY)
Abs Immature Granulocytes: 0.01 10*3/uL (ref 0.00–0.07)
Basophils Absolute: 0.1 10*3/uL (ref 0.0–0.1)
Basophils Relative: 1 %
Eosinophils Absolute: 0.3 10*3/uL (ref 0.0–0.5)
Eosinophils Relative: 3 %
HCT: 36.1 % — ABNORMAL LOW (ref 39.0–52.0)
Hemoglobin: 11.9 g/dL — ABNORMAL LOW (ref 13.0–17.0)
Immature Granulocytes: 0 %
Lymphocytes Relative: 34 %
Lymphs Abs: 2.7 10*3/uL (ref 0.7–4.0)
MCH: 26.8 pg (ref 26.0–34.0)
MCHC: 33 g/dL (ref 30.0–36.0)
MCV: 81.3 fL (ref 80.0–100.0)
Monocytes Absolute: 0.6 10*3/uL (ref 0.1–1.0)
Monocytes Relative: 7 %
Neutro Abs: 4.3 10*3/uL (ref 1.7–7.7)
Neutrophils Relative %: 55 %
Platelet Count: 337 10*3/uL (ref 150–400)
RBC: 4.44 MIL/uL (ref 4.22–5.81)
RDW: 14.8 % (ref 11.5–15.5)
WBC Count: 7.9 10*3/uL (ref 4.0–10.5)
nRBC: 0 % (ref 0.0–0.2)

## 2019-04-10 LAB — TOTAL PROTEIN, URINE DIPSTICK: Protein, ur: NEGATIVE mg/dL

## 2019-04-10 LAB — CMP (CANCER CENTER ONLY)
ALT: 42 U/L (ref 0–44)
AST: 42 U/L — ABNORMAL HIGH (ref 15–41)
Albumin: 3.6 g/dL (ref 3.5–5.0)
Alkaline Phosphatase: 99 U/L (ref 38–126)
Anion gap: 8 (ref 5–15)
BUN: 16 mg/dL (ref 8–23)
CO2: 24 mmol/L (ref 22–32)
Calcium: 9 mg/dL (ref 8.9–10.3)
Chloride: 104 mmol/L (ref 98–111)
Creatinine: 1.06 mg/dL (ref 0.61–1.24)
GFR, Est AFR Am: >60 mL/min (ref >60)
GFR, Estimated: >60 mL/min (ref >60)
Glucose, Bld: 225 mg/dL — ABNORMAL HIGH (ref 70–99)
Potassium: 4.1 mmol/L (ref 3.5–5.1)
Sodium: 136 mmol/L (ref 135–145)
Total Bilirubin: 0.4 mg/dL (ref 0.3–1.2)
Total Protein: 7.5 g/dL (ref 6.5–8.1)

## 2019-04-10 LAB — TSH: TSH: 0.508 u[IU]/mL (ref 0.320–4.118)

## 2019-04-10 MED ORDER — AMOXICILLIN-POT CLAVULANATE 875-125 MG PO TABS: 1.0000 | ORAL_TABLET | Freq: Two times a day (BID) | ORAL | 0 refills | Status: DC

## 2019-04-10 NOTE — Progress Notes (Signed)
[place] Work  Clinical Social Work received phone call from patient stating he needs to have dental work due prior to starting chemotherapy and he cannot afford dental expense.  CSW is unaware of community resources to pay dental expenses.  CSW discussed with healthcare team, unsure if patient can see Dr. Enrique Sack for extraction due to medical necessity to start chemotherapy.  RN will discuss with oncologist on Monday.  Patient agreed to wait until Monday to formulate plan.  Gwinda Maine, LCSW  Clinical Social Worker [facility]

## 2019-04-10 NOTE — Progress Notes (Signed)
  [place] OFFICE PROGRESS NOTE   Diagnosis: Hepatocellular carcinoma  INTERVAL HISTORY:   [name] returns as scheduled.  He completed cycle 1 atezolizumab/bevacizumab 03/20/2019.  He reports tolerating treatment well.  No diarrhea.  No skin rash.  He denies bleeding.  No shortness of breath.  No leg swelling or calf pain.  No nausea or vomiting.  For the past week he has had a "tooth ache" involving a tooth at the right upper posterior gumline.  He feels the tooth needs to be removed.  He denies fever.  Objective:  Vital signs in last 24 hours:  Blood pressure (!) 148/70, pulse 92, temperature 98.9 F (37.2 C), temperature source Temporal, resp. rate 18, height 5\' 4"  (1.626 m), weight 195 lb 14.4 oz (88.9 kg), SpO2 99 %.    HEENT: Poor dentition.  Tooth at the right upper posterior gumline is loose, tender with manipulation. GI: Abdomen soft and nontender.  No hepatomegaly. Vascular: No leg edema. Neuro: Alert and oriented. Skin: No rash.   Lab Results:  Lab Results  Component Value Date   WBC 7.9 04/10/2019   HGB 11.9 (L) 04/10/2019   HCT 36.1 (L) 04/10/2019   MCV 81.3 04/10/2019   PLT 337 04/10/2019   NEUTROABS 4.3 04/10/2019    Imaging:  No results found.  Medications: I have reviewed the patient's current medications.  Assessment/Plan: 1. Hepatocellular carcinoma  02/12/2019 AFP 10,200  02/18/2019 CT abdomen-dominant poorly marginated heterogeneously enhancing 8.6 x 7.7 cm posterior right liver lobe mass, new compared to CT study 09/10/2016.  Numerous similar-appearing liver masses scattered throughout the liver all new since 09/10/2016 CT.  Diffusely irregular liver surface compatible with cirrhosis.  Spleen normal size.  No ascites.  No abdominal adenopathy.  MRI liver 03/05/2019-multifocal hepatic metastases including a 10 cm lesion in the right liver, Li-rads 5, no evidence of metastatic disease  Cycle 1 atezolizumab/bevacizumab  03/20/2019 2. 01/20/2019 chest CT-no definite evidence of pulmonary embolus.  Wall thickening of the distal esophagus.  Nodular hepatic contours. 3. Cirrhosis  4. Hepatitis C status post [place] 5. Type 2 diabetes 6. COPD 7. CAD 8. Chronic diastolic heart failure 9. Hypertension 10. Positive Cologuard 2019   Disposition: Mr. Bub appears stable.  He completed cycle 1 atezolizumab/bevacizumab 03/20/2019 and seems to have tolerated well.  He has a loose, painful tooth that may need to be removed.  We decided to hold today's treatment and reschedule for 1 week.  He will begin a course of Augmentin and try to arrange for a dental appointment as soon as possible.  He will return for lab, follow-up and treatment in 1 week.  He will contact the office in the interim with any problems.  Plan reviewed with Dr. Benay Spice by phone.    [name] ANP/GNP-BC   04/10/2019  12:55 PM

## 2019-04-10 NOTE — Telephone Encounter (Signed)
[place] CSW Progress Notes  Called patient as his infusion today was cancelled due to issues with his teeth.  Does not have current family dentist, is looking to see what his insurance might cover.  Emailed patient list of dentists that [place] states are in network w his [employer] coverage.  He has received a Medicaid application from [place] and appreciates her help.  Edwyna Shell, LCSW Clinical Social Worker Phone:  [phone]

## 2019-04-13 ENCOUNTER — Telehealth: Payer: Self-pay

## 2019-04-13 ENCOUNTER — Other Ambulatory Visit: Payer: Self-pay

## 2019-04-13 ENCOUNTER — Ambulatory Visit: Payer: Self-pay

## 2019-04-13 ENCOUNTER — Encounter: Payer: Self-pay | Admitting: *Deleted

## 2019-04-13 ENCOUNTER — Encounter: Payer: Self-pay | Admitting: Oncology

## 2019-04-13 ENCOUNTER — Telehealth: Payer: Self-pay | Admitting: Oncology

## 2019-04-13 DIAGNOSIS — E119 Type 2 diabetes mellitus without complications: Secondary | ICD-10-CM

## 2019-04-13 DIAGNOSIS — Z794 Long term (current) use of insulin: Secondary | ICD-10-CM

## 2019-04-13 MED ORDER — "BD VEO INSULIN SYRINGE U/F 31G X 15/64"" 1 ML MISC": 4 refills | Status: AC

## 2019-04-13 MED ORDER — BD SWAB SINGLE USE REGULAR PADS: MEDICATED_PAD | 11 refills | Status: AC

## 2019-04-13 MED ORDER — ACCU-CHEK GUIDE VI STRP: 1.0000 | ORAL_STRIP | Freq: Four times a day (QID) | 4 refills | Status: AC

## 2019-04-13 MED ORDER — ACCU-CHEK GUIDE ME W/DEVICE KIT: 1.0000 | PACK | 0 refills | Status: AC

## 2019-04-13 NOTE — Telephone Encounter (Signed)
Verified that patients tooth appointment is tomorrow 04/14/19 morning

## 2019-04-13 NOTE — Telephone Encounter (Signed)
Per Ned Card NP called Dr Sharl Ma office at urgent tooth [phone] and let them know that patient is on treatment with Avastin and atezolizumab.

## 2019-04-13 NOTE — Progress Notes (Signed)
[place] Clinical Social Work  [place] representative contacted patient at home to offer support and follow up on financial concerns regarding his dental appointment.  CSW contacted [facility], who is also providing support to patient.  [facility] has also made a referral to [facility] CSW.  Patient later contacted CSW and stated his brother was able to provide the financial support needed for his dental appointment tomorrow.  CSW provided patient with contact information and encouraged him to call with questions or concerns.   Johnnye Lana, MSW, LCSW, OSW-C Clinical Social Worker [facility] [phone]

## 2019-04-13 NOTE — Telephone Encounter (Signed)
Left voicemail for patient to call back [place] in regard to the date of his dentist appointment

## 2019-04-13 NOTE — Patient Outreach (Signed)
[facility]) Care Management  [place]  04/13/2019   [name] September 04, 1950 [id number]  Subjective: Telephone call to patient for monthly call. Patient reports he has a tooth that needs to be pulled.  He states that the cancer center is trying to work with him as he cannot get his chemo if he does not get the tooth pulled.  Advised patient that the social worker from the cancer center messaged CM about the tooth and that CM would refer to social work for assistance.  He verbalized understanding. Patient reports that he saw Dr. Loanne Drilling and that he increased his insulin to 130 units due to some higher blood sugars. His A1c is down to 9.6 now but patient states he struggles with bread.  Discussed diabetic diet and sugars. He reports that his sugars are some better now and that last check was 117 this morning.  Encouraged patient to continue to try to control sugars.  Patient denies any problems with transportation to appointments at this time and states he feels grateful for no side effects from his most recent cancer treatments.   Objective:   Encounter Medications:  Outpatient Encounter Medications as of 04/13/2019  Medication Sig Note  . albuterol (VENTOLIN HFA) 108 (90 Base) MCG/ACT inhaler Inhale 1-2 puffs into the lungs every 4 (four) hours as needed for wheezing or shortness of breath. (Patient not taking: Reported on 04/10/2019) 02/11/2019: On hand  . Alcohol Swabs (B-D SINGLE USE SWABS REGULAR) PADS Use to swab finger 3 times daily.   [name] amoxicillin-clavulanate (AUGMENTIN) 875-125 MG tablet Take 1 tablet by mouth 2 (two) times daily.   [name] aspirin EC 81 MG tablet Take 1 tablet (81 mg total) by mouth daily.   . Blood Glucose Monitoring Suppl (ACCU-CHEK GUIDE ME) w/Device KIT USE ONCE DAILY AS NEEDED   . cholecalciferol (VITAMIN D) 1000 UNITS tablet Take 1 tablet (1,000 Units total) by mouth daily. (Patient taking differently: Take 1,000 Units by mouth 2 (two) times daily.  )   . Cinnamon 500 MG capsule Take 500 mg by mouth 2 (two) times daily.   [name] glucose blood (ACCU-CHEK GUIDE) test strip 1 each by Other route 4 (four) times daily.   . hydrochlorothiazide (HYDRODIURIL) 25 MG tablet Take 1 tablet by mouth once daily   . insulin NPH-regular Human (NOVOLIN 70/30) (70-30) 100 UNIT/ML injection Inject 130 Units into the skin daily with breakfast.   . Lancets (ACCU-CHEK SOFT TOUCH) lancets Use to check your blood sugars 4 times per day, once before each meal and once at bedtime; E11.9   . latanoprost (XALATAN) 0.005 % ophthalmic solution Place 1 drop into both eyes daily.   [name] lisinopril-hydrochlorothiazide (ZESTORETIC) 20-25 MG tablet Take 1 tablet by mouth daily.   . methocarbamol (ROBAXIN) 500 MG tablet Take 1 tablet (500 mg total) by mouth 2 (two) times daily.   . metoCLOPramide (REGLAN) 5 MG tablet Take 1 tablet (5 mg total) by mouth every 8 (eight) hours as needed for nausea.   . Multiple Vitamins-Minerals (MULTIVITAMIN WITH MINERALS) tablet Take 1 tablet by mouth daily.   . Omega-3 Fatty Acids (FISH OIL) 1000 MG CAPS Take 2 capsules by mouth daily.   . pantoprazole (PROTONIX) 40 MG tablet Take 1 tablet (40 mg total) by mouth daily before breakfast.   . prochlorperazine (COMPAZINE) 10 MG tablet Take 1 tablet (10 mg total) by mouth every 6 (six) hours as needed. (Patient not taking: Reported on 04/10/2019)   .  sildenafil (REVATIO) 20 MG tablet TAKE 5 TABLETS BY MOUTH DAILY AS NEEDED   . sildenafil (VIAGRA) 100 MG tablet Take 0.5-1 tablets (50-100 mg total) by mouth daily as needed for erectile dysfunction.   . traZODone (DESYREL) 50 MG tablet Take 1 tablet (50 mg total) by mouth at bedtime as needed for sleep.   [name] umeclidinium-vilanterol (ANORO ELLIPTA) 62.5-25 MCG/INH AEPB Inhale 1 puff into the lungs daily. NEED OFFICE VISIT FOR FURTHER REFILLS   . [DISCONTINUED] atorvastatin (LIPITOR) 40 MG tablet Take 1 tablet (40 mg total) by mouth daily.    No  facility-administered encounter medications on file as of 04/13/2019.    Functional Status:  In your present state of health, do you have any difficulty performing the following activities: 11/20/2018 09/15/2018  Hearing? N N  Vision? N N  Difficulty concentrating or making decisions? N N  Walking or climbing stairs? N N  Dressing or bathing? N N  Doing errands, shopping? N N  Preparing Food and eating ? N N  Using the Toilet? N N  In the past six months, have you accidently leaked urine? N N  Do you have problems with loss of bowel control? N N  Managing your Medications? N N  Managing your Finances? N N  Housekeeping or managing your Housekeeping? N -  Some recent data might be hidden    Fall/Depression Screening: Fall Risk  09/15/2018 08/29/2018 08/23/2017  Falls in the past year? 0 0 No  Number falls in past yr: - 0 -  Follow up Falls evaluation completed - -   PHQ 2/9 Scores 09/15/2018 09/02/2018 08/29/2018 08/23/2017 06/24/2015 05/10/2015 03/16/2015  PHQ - 2 Score 0 0 1 1 0 0 0  PHQ- 9 Score - - - 2 - - -    Assessment: Patient needing tooth extraction.  Social work referral made.  Patient managing chronic health issues.    Plan:  [facility] CM Care Plan Problem One     Most Recent Value  Care Plan Problem One  Knowledge Deficit in self management of Diabetes  Role Documenting the Problem One  Care Management Telephonic [place] for Problem One  Active  [facility] Long Term Goal   Patient will  verbalize a decrease in A1C from 9.6 within the next 90 days  [place] Term Goal Start Date  04/13/19  Interventions for Problem One Long Term Goal  Patient lastest A1c is 9.6.  Patient insulin increased due to some high blood sugars. Patient reports blood sugars are better and that last report was 117.  Discussed diabetic diet.     RN CM will outreach again in the month of March and patient agreeable.   [name], RN, MSN [place] Management Care Management Coordinator Direct  Line [phone] Cell [phone] Toll Free: [phone]  Fax: [phone]

## 2019-04-13 NOTE — Progress Notes (Signed)
Met w/ pt on 04/10/19 to introduce myself as his Arboriculturist and to discuss copay assistance and the [employer].  Pt gave me consent to apply in his behalf so I applied to the [place] and he was approved for $13,500 for 12 months from2/8/21.  I also informed him of the [place], went over what it covers and gave him an expense sheet.  Pt would like to apply so he will bring his proof of income to his next visit.  Pt also wanted to apply for Medicaid so I gave him a Medicaid application and advised to turn the completed application along with the docs needed for processing to the DSS in his county.  I gave him my card for any questions or concerns he may have in the future.

## 2019-04-13 NOTE — Telephone Encounter (Signed)
Scheduled per los. Called and spoke with patient. Confirmed appt 

## 2019-04-13 NOTE — Telephone Encounter (Signed)
Per Ned Card NP followed up with patient to find out the name of the dentist so we can call the office and let them know he is on treatment with Avastin and atezolizumab. Patient stated that he is scheduled With Dr Uvaldo Bristle off of [place] friendly at [facility] on Friday 04/17/19. Also patient stated that he no longer needed assistance with paying for dental visit, that his brother would be paying for it for him. Lattie Haw made aware.

## 2019-04-16 ENCOUNTER — Other Ambulatory Visit: Payer: Self-pay

## 2019-04-16 ENCOUNTER — Other Ambulatory Visit: Payer: Self-pay | Admitting: Physician Assistant

## 2019-04-16 DIAGNOSIS — E119 Type 2 diabetes mellitus without complications: Secondary | ICD-10-CM

## 2019-04-16 MED ORDER — ACCU-CHEK GUIDE CONTROL VI LIQD: 1.0000 | 0 refills | Status: AC | PRN

## 2019-04-16 MED ORDER — PANTOPRAZOLE SODIUM 40 MG PO TBEC: DELAYED_RELEASE_TABLET | ORAL | 1 refills | Status: DC

## 2019-04-16 MED ORDER — ACCU-CHEK FASTCLIX LANCETS MISC: 1.0000 | Freq: Four times a day (QID) | 0 refills | Status: AC

## 2019-04-16 NOTE — Progress Notes (Signed)
refill of pantoprazole sent

## 2019-04-17 ENCOUNTER — Inpatient Hospital Stay: Payer: Medicare HMO

## 2019-04-17 ENCOUNTER — Inpatient Hospital Stay: Payer: Medicare HMO | Admitting: Nurse Practitioner

## 2019-04-21 ENCOUNTER — Telehealth: Payer: Self-pay

## 2019-04-21 DIAGNOSIS — J4521 Mild intermittent asthma with (acute) exacerbation: Secondary | ICD-10-CM

## 2019-04-21 MED ORDER — ANORO ELLIPTA 62.5-25 MCG/INH IN AEPB: 1.0000 | INHALATION_SPRAY | Freq: Every day | RESPIRATORY_TRACT | 0 refills | Status: DC

## 2019-04-21 NOTE — Telephone Encounter (Signed)
New message     1. Which medications need to be refilled? (please list name of each medication and dose if known) umeclidinium-vilanterol (ANORO ELLIPTA) 62.5-25 MCG/INH AEPB  2. Which pharmacy/location (including street and city if local pharmacy) is medication to be sent to?[facility] mail order   3. Do they need a 30 day or 90 day supply? 90 days supply

## 2019-04-24 ENCOUNTER — Inpatient Hospital Stay (HOSPITAL_BASED_OUTPATIENT_CLINIC_OR_DEPARTMENT_OTHER): Payer: Medicare HMO | Admitting: Nurse Practitioner

## 2019-04-24 ENCOUNTER — Inpatient Hospital Stay: Payer: Medicare HMO

## 2019-04-24 ENCOUNTER — Other Ambulatory Visit: Payer: Self-pay

## 2019-04-24 ENCOUNTER — Encounter: Payer: Self-pay | Admitting: Nurse Practitioner

## 2019-04-24 VITALS — BP 142/61

## 2019-04-24 VITALS — BP 155/78 | HR 97 | Temp 98.2°F | Resp 20 | Ht 64.0 in | Wt 197.7 lb

## 2019-04-24 DIAGNOSIS — C220 Liver cell carcinoma: Secondary | ICD-10-CM

## 2019-04-24 DIAGNOSIS — K7460 Unspecified cirrhosis of liver: Secondary | ICD-10-CM | POA: Diagnosis not present

## 2019-04-24 DIAGNOSIS — Z5112 Encounter for antineoplastic immunotherapy: Secondary | ICD-10-CM | POA: Diagnosis not present

## 2019-04-24 DIAGNOSIS — B1920 Unspecified viral hepatitis C without hepatic coma: Secondary | ICD-10-CM | POA: Diagnosis not present

## 2019-04-24 DIAGNOSIS — I110 Hypertensive heart disease with heart failure: Secondary | ICD-10-CM | POA: Diagnosis not present

## 2019-04-24 DIAGNOSIS — E119 Type 2 diabetes mellitus without complications: Secondary | ICD-10-CM | POA: Diagnosis not present

## 2019-04-24 DIAGNOSIS — I5032 Chronic diastolic (congestive) heart failure: Secondary | ICD-10-CM | POA: Diagnosis not present

## 2019-04-24 DIAGNOSIS — J449 Chronic obstructive pulmonary disease, unspecified: Secondary | ICD-10-CM | POA: Diagnosis not present

## 2019-04-24 DIAGNOSIS — I2510 Atherosclerotic heart disease of native coronary artery without angina pectoris: Secondary | ICD-10-CM | POA: Diagnosis not present

## 2019-04-24 LAB — CMP (CANCER CENTER ONLY)
ALT: 48 U/L — ABNORMAL HIGH (ref 0–44)
AST: 48 U/L — ABNORMAL HIGH (ref 15–41)
Albumin: 4 g/dL (ref 3.5–5.0)
Alkaline Phosphatase: 92 U/L (ref 38–126)
Anion gap: 11 (ref 5–15)
BUN: 27 mg/dL — ABNORMAL HIGH (ref 8–23)
CO2: 22 mmol/L (ref 22–32)
Calcium: 9.1 mg/dL (ref 8.9–10.3)
Chloride: 101 mmol/L (ref 98–111)
Creatinine: 1.27 mg/dL — ABNORMAL HIGH (ref 0.61–1.24)
GFR, Est AFR Am: >60 mL/min (ref >60)
GFR, Estimated: 58 mL/min — ABNORMAL LOW (ref >60)
Glucose, Bld: 205 mg/dL — ABNORMAL HIGH (ref 70–99)
Potassium: 3.7 mmol/L (ref 3.5–5.1)
Sodium: 134 mmol/L — ABNORMAL LOW (ref 135–145)
Total Bilirubin: 0.6 mg/dL (ref 0.3–1.2)
Total Protein: 8.2 g/dL — ABNORMAL HIGH (ref 6.5–8.1)

## 2019-04-24 LAB — CBC WITH DIFFERENTIAL (CANCER CENTER ONLY)
Abs Immature Granulocytes: 0.01 10*3/uL (ref 0.00–0.07)
Basophils Absolute: 0.1 10*3/uL (ref 0.0–0.1)
Basophils Relative: 1 %
Eosinophils Absolute: 0.3 10*3/uL (ref 0.0–0.5)
Eosinophils Relative: 4 %
HCT: 41.9 % (ref 39.0–52.0)
Hemoglobin: 13.9 g/dL (ref 13.0–17.0)
Immature Granulocytes: 0 %
Lymphocytes Relative: 43 %
Lymphs Abs: 3.3 10*3/uL (ref 0.7–4.0)
MCH: 27.4 pg (ref 26.0–34.0)
MCHC: 33.2 g/dL (ref 30.0–36.0)
MCV: 82.6 fL (ref 80.0–100.0)
Monocytes Absolute: 0.5 10*3/uL (ref 0.1–1.0)
Monocytes Relative: 7 %
Neutro Abs: 3.5 10*3/uL (ref 1.7–7.7)
Neutrophils Relative %: 45 %
Platelet Count: 313 10*3/uL (ref 150–400)
RBC: 5.07 MIL/uL (ref 4.22–5.81)
RDW: 15.4 % (ref 11.5–15.5)
WBC Count: 7.7 10*3/uL (ref 4.0–10.5)
nRBC: 0 % (ref 0.0–0.2)

## 2019-04-24 MED ORDER — SODIUM CHLORIDE 0.9 % IV SOLN
15.0000 mg/kg | Freq: Once | INTRAVENOUS | Status: AC
  Administered 2019-04-24: 1300 mg via INTRAVENOUS
  Filled 2019-04-24: qty 48

## 2019-04-24 MED ORDER — SODIUM CHLORIDE 0.9 % IV SOLN
Freq: Once | INTRAVENOUS | Status: AC
  Filled 2019-04-24: qty 250

## 2019-04-24 MED ORDER — SODIUM CHLORIDE 0.9 % IV SOLN
1200.0000 mg | Freq: Once | INTRAVENOUS | Status: AC
  Administered 2019-04-24: 1200 mg via INTRAVENOUS
  Filled 2019-04-24: qty 20

## 2019-04-24 NOTE — Patient Instructions (Signed)
[facility] Discharge Instructions for Patients Receiving Chemotherapy  Today you received the following chemotherapy agents: Bevacizumab and Atezolizumab.  To help prevent nausea and vomiting after your treatment, we encourage you to take your nausea medication as directed.   If you develop nausea and vomiting that is not controlled by your nausea medication, call the clinic.   BELOW ARE SYMPTOMS THAT SHOULD BE REPORTED IMMEDIATELY:  *FEVER GREATER THAN 100.5 F  *CHILLS WITH OR WITHOUT FEVER  NAUSEA AND VOMITING THAT IS NOT CONTROLLED WITH YOUR NAUSEA MEDICATION  *UNUSUAL SHORTNESS OF BREATH  *UNUSUAL BRUISING OR BLEEDING  TENDERNESS IN MOUTH AND THROAT WITH OR WITHOUT PRESENCE OF ULCERS  *URINARY PROBLEMS  *BOWEL PROBLEMS  UNUSUAL RASH Items with * indicate a potential emergency and should be followed up as soon as possible.  Feel free to call the clinic should you have any questions or concerns. The clinic phone number is [phone].  Please show the [place] at check-in to the Emergency Department and triage nurse.

## 2019-04-24 NOTE — Addendum Note (Signed)
Addended by: Betsy Coder B on: 04/24/2019 02:15 PM   Modules accepted: Orders

## 2019-04-24 NOTE — Progress Notes (Signed)
  [place] OFFICE PROGRESS NOTE   Diagnosis: Hepatocellular carcinoma  INTERVAL HISTORY:   [name] returns as scheduled.  He completed cycle 1 atezolizumab/bevacizumab 03/20/2019.  Cycle 2 was held on 04/10/2019 due to the need for a tooth extraction.  He underwent the extraction on 04/14/2019.  He feels the site of tooth removal has healed.  He is no longer having pain.  No nausea or vomiting.  No diarrhea.  No rash.  He denies bleeding.  No fever, cough, shortness of breath.  Objective:  Vital signs in last 24 hours:  Blood pressure (!) 155/78, pulse 97, temperature 98.2 F (36.8 C), temperature source Temporal, resp. rate 20, height 5\' 4"  (1.626 m), weight 197 lb 11.2 oz (89.7 kg), SpO2 100 %.    HEENT: No thrush or ulcers.  Site of tooth extraction appears healed. GI: Abdomen soft and nontender.  No hepatomegaly. Vascular: No leg edema. Neuro: Alert and oriented.   Lab Results:  Lab Results  Component Value Date   WBC 7.7 04/24/2019   HGB 13.9 04/24/2019   HCT 41.9 04/24/2019   MCV 82.6 04/24/2019   PLT 313 04/24/2019   NEUTROABS 3.5 04/24/2019    Imaging:  No results found.  Medications: I have reviewed the patient's current medications.  Assessment/Plan: 1. Hepatocellular carcinoma  02/12/2019 AFP 10,200  02/18/2019 CT abdomen-dominant poorly marginated heterogeneously enhancing 8.6 x 7.7 cm posterior right liver lobe mass, new compared to CT study 09/10/2016. Numerous similar-appearing liver masses scattered throughout the liver all new since 09/10/2016 CT. Diffusely irregular liver surface compatible with cirrhosis. Spleen normal size. No ascites. No abdominal adenopathy.  MRI liver 03/05/2019-multifocal hepatic metastases including a 10 cm lesion in the right liver,Li-rads 5, no evidence of metastatic disease  Cycle 1 atezolizumab/bevacizumab 03/20/2019  Cycle 2 atezolizumab/bevacizumab 04/24/2019 2. 01/20/2019 chest CT-no definite evidence  of pulmonary embolus. Wall thickening of the distal esophagus. Nodular hepatic contours. 3. Cirrhosis  4. Hepatitis C status post Harvoni 5. Type 2 diabetes 6. COPD 7. CAD 8. Chronic diastolic heart failure 9. Hypertension 10. Positive Cologuard 2019  Disposition: [name] appears stable.  He has completed 1 cycle of atezolizumab/bevacizumab.  Cycle 2 was held on 04/10/2019 due to the need for a tooth extraction.  He underwent the tooth extraction on 04/14/2019, area appears healed.  Plan to proceed with cycle 2 atezolizumab/bevacizumab today.  We reviewed the CBC from today.  Counts adequate for treatment.  He will return for lab, follow-up, cycle 3 atezolizumab/as a physician [place] in 3 weeks.  He will contact the office in the interim with any problems.  Plan reviewed with Dr. Benay Spice.    Ned Card ANP/GNP-BC   04/24/2019  2:04 PM

## 2019-04-27 ENCOUNTER — Other Ambulatory Visit: Payer: Self-pay | Admitting: Internal Medicine

## 2019-04-27 ENCOUNTER — Encounter: Payer: Self-pay | Admitting: *Deleted

## 2019-04-27 ENCOUNTER — Telehealth: Payer: Self-pay | Admitting: Oncology

## 2019-04-27 DIAGNOSIS — J4521 Mild intermittent asthma with (acute) exacerbation: Secondary | ICD-10-CM

## 2019-04-27 NOTE — Patient Outreach (Signed)
[facility]) Care Management  04/27/2019  [name] 04/15/1950 [id number]   CSW had received referral from [place] that patient needs a tooth extraction that cost $300 and patient unable to afford. Patient unable to proceed with chemotherapy until tooth is pulled. CSW noted from [place] that patient's brother was able to provide the financial support needed for his extraction. No further CSW needs at this time - CSW closing case.    [name], LCSW [facility]  Clinical Social Worker cell #: [phone]

## 2019-04-27 NOTE — Telephone Encounter (Signed)
Scheduled per los. Called and spoke with patient. Confirmed appt 

## 2019-04-29 ENCOUNTER — Telehealth: Payer: Self-pay | Admitting: Internal Medicine

## 2019-04-29 NOTE — Telephone Encounter (Signed)
New Message:  Pt c/o medication issue:  1. Name of Medication: ANORO ELLIPTA 62.5-25 MCG/INH AEPB  2. What is your medication issue? Elizabeth from [place] is calling due to receiving this order for this patient. She states this medication is not covered under the patient's insurance and would like to suggest an alternate medication

## 2019-05-05 ENCOUNTER — Telehealth: Payer: Self-pay

## 2019-05-05 NOTE — Telephone Encounter (Signed)
I had discontinued hctz 25 mg back in September. He should only be taking lisinopril/hctz.

## 2019-05-05 NOTE — Telephone Encounter (Signed)
[employer] has been informed

## 2019-05-05 NOTE — Telephone Encounter (Signed)
Ternika calling with [facility] and is needing clarification on whether the patient is taking one of both of the following medications: hydrochlorothiazide (HYDRODIURIL) 25 MG tablet lisinopril-hydrochlorothiazide (ZESTORETIC) 20-25 MG tablet   CB#: [phone] Ref#: [id number]

## 2019-05-05 NOTE — Telephone Encounter (Signed)
Is the patient taking both?  The last refill of the hydrochlorothiazide was 03/23/19 for 90# no refills and the lisinopril-hydrochlorothiazide was filled on 11/06/18 90# 3 refills. Please advise.

## 2019-05-07 ENCOUNTER — Telehealth: Payer: Self-pay | Admitting: General Practice

## 2019-05-07 NOTE — Telephone Encounter (Signed)
[place] CSW Progress Notes  Patient called concerned about "these high bills I am receiving, I think someone told me I qualified for some kind of grant6 to help."  Clarified that he was referring to [employer] which assists w living expenses only - he has Lenise's contact information to call and complete his enrollment in this program and ask for help w living expenses bills.  Has also applied for Medicaid w [employer] (Ms [place] [phone]).  Applied approx a month ago, "I have not heard anything, but did leave VM for DSS caseworker today.  Encouraged him to continue to work w DSS on [employer] application.    Edwyna Shell, LCSW Clinical Social Worker Phone:  [phone] Cell:  [phone]

## 2019-05-10 ENCOUNTER — Other Ambulatory Visit: Payer: Self-pay | Admitting: Oncology

## 2019-05-11 ENCOUNTER — Other Ambulatory Visit: Payer: Self-pay

## 2019-05-11 NOTE — Patient Outreach (Signed)
[facility]) Care Management  [place]  05/11/2019   [name] 1950/06/11 [id number]  Subjective: Telephone call to patient for monthly disease management check in. Patient reports he is doing ok.  He states that he was turned down for medicaid due to income. Patient has expensive cancer treatment bills coming in and he is trying to get some assistance with. He states he is working with the cancer center on some grants.  He reports needing some help with utilities as he has not been able to pay those.  Discussed [facility] social worker assisting with bills.  He is agreeable.  Patient reports blood pressure and sugars have been ok and that sugar ranges 95-180.  Discussed keeping sugars up and limiting carbohydrates as well.  He verbalized understanding.  He denies any other problems and just trying not to worry about things he cannot control.    Objective:   Encounter Medications:  Outpatient Encounter Medications as of 05/11/2019  Medication Sig Note  . Accu-Chek FastClix Lancets MISC 1 each by Does not apply route 4 (four) times daily. E11.9   . albuterol (VENTOLIN HFA) 108 (90 Base) MCG/ACT inhaler Inhale 1-2 puffs into the lungs every 4 (four) hours as needed for wheezing or shortness of breath. 02/11/2019: On hand  . Alcohol Swabs (B-D SINGLE USE SWABS REGULAR) PADS Use to swab finger 4 times daily.   [name] amoxicillin-clavulanate (AUGMENTIN) 875-125 MG tablet Take 1 tablet by mouth 2 (two) times daily.   Jearl Klinefelter ELLIPTA 62.5-25 MCG/INH AEPB INHALE 1 PUFF INTO THE LUNGS DAILY. NEED OFFICE VISIT FOR FURTHER REFILLS.   [name] aspirin EC 81 MG tablet Take 1 tablet (81 mg total) by mouth daily.   . Blood Glucose Calibration (ACCU-CHEK GUIDE CONTROL) LIQD 1 each by Other route as needed (Use to calibrate meter prn). E11.9   . Blood Glucose Monitoring Suppl (ACCU-CHEK GUIDE ME) w/Device KIT 1 kit by Does not apply route See admin instructions. Check blood sugar 4 times a day   .  cholecalciferol (VITAMIN D) 1000 UNITS tablet Take 1 tablet (1,000 Units total) by mouth daily. (Patient taking differently: Take 1,000 Units by mouth 2 (two) times daily. )   . Cinnamon 500 MG capsule Take 500 mg by mouth 2 (two) times daily.   [name] glucose blood (ACCU-CHEK GUIDE) test strip 1 each by Other route 4 (four) times daily.   . hydrochlorothiazide (HYDRODIURIL) 25 MG tablet Take 1 tablet by mouth once daily   . insulin NPH-regular Human (NOVOLIN 70/30) (70-30) 100 UNIT/ML injection Inject 130 Units into the skin daily with breakfast.   . Insulin Syringe-Needle U-100 (BD VEO INSULIN SYRINGE U/F) 31G X 15/64" 1 ML MISC Use for injecting insulin once a day.   . latanoprost (XALATAN) 0.005 % ophthalmic solution Place 1 drop into both eyes daily.   [name] lisinopril-hydrochlorothiazide (ZESTORETIC) 20-25 MG tablet Take 1 tablet by mouth daily.   . methocarbamol (ROBAXIN) 500 MG tablet Take 1 tablet (500 mg total) by mouth 2 (two) times daily.   . metoCLOPramide (REGLAN) 5 MG tablet Take 1 tablet (5 mg total) by mouth every 8 (eight) hours as needed for nausea.   . Multiple Vitamins-Minerals (MULTIVITAMIN WITH MINERALS) tablet Take 1 tablet by mouth daily.   . Omega-3 Fatty Acids (FISH OIL) 1000 MG CAPS Take 2 capsules by mouth daily.   . pantoprazole (PROTONIX) 40 MG tablet TAKE 1 TABLET BY MOUTH ONCE DAILY BEFORE  BREAKFAST.   [name]  prochlorperazine (COMPAZINE) 10 MG tablet Take 1 tablet (10 mg total) by mouth every 6 (six) hours as needed.   . sildenafil (REVATIO) 20 MG tablet TAKE 5 TABLETS BY MOUTH DAILY AS NEEDED   . sildenafil (VIAGRA) 100 MG tablet Take 0.5-1 tablets (50-100 mg total) by mouth daily as needed for erectile dysfunction.   . traZODone (DESYREL) 50 MG tablet Take 1 tablet (50 mg total) by mouth at bedtime as needed for sleep.   . [DISCONTINUED] atorvastatin (LIPITOR) 40 MG tablet Take 1 tablet (40 mg total) by mouth daily.    No facility-administered encounter medications on file as  of 05/11/2019.    Functional Status:  In your present state of health, do you have any difficulty performing the following activities: 11/20/2018 09/15/2018  Hearing? N N  Vision? N N  Difficulty concentrating or making decisions? N N  Walking or climbing stairs? N N  Dressing or bathing? N N  Doing errands, shopping? N N  Preparing Food and eating ? N N  Using the Toilet? N N  In the past six months, have you accidently leaked urine? N N  Do you have problems with loss of bowel control? N N  Managing your Medications? N N  Managing your Finances? N N  Housekeeping or managing your Housekeeping? N -  Some recent data might be hidden    Fall/Depression Screening: Fall Risk  09/15/2018 08/29/2018 08/23/2017  Falls in the past year? 0 0 No  Number falls in past yr: - 0 -  Follow up Falls evaluation completed - -   PHQ 2/9 Scores 09/15/2018 09/02/2018 08/29/2018 08/23/2017 06/24/2015 05/10/2015 03/16/2015  PHQ - 2 Score 0 0 1 1 0 0 0  PHQ- 9 Score - - - 2 - - -    Assessment: Patient continues to manage chronic illnesses.  Patient working to get assistance with medical bills and needing some help with utilities.    Plan:  [facility] CM Care Plan Problem One     Most Recent Value  Care Plan Problem One  Knowledge Deficit in self management of Diabetes  Role Documenting the Problem One  Care Management Telephonic [place] for Problem One  Active  [facility] Long Term Goal   Patient will  verbalize a decrease in A1C from 9.6 within the next 90 days  [facility] Long Term Goal Start Date  04/13/19  Interventions for Problem One Long Term Goal  RN CM discussed sugar ranges with patient.  Discussed importance of blood sugar control.       RN CM will contact patient next month and patient agreeable.    [name], RN, MSN [facility] Care Management Coordinator Direct Line [phone] Cell [phone] Toll Free: [phone]  Fax: [phone]

## 2019-05-12 ENCOUNTER — Telehealth: Payer: Self-pay | Admitting: Internal Medicine

## 2019-05-12 NOTE — Telephone Encounter (Signed)
Pt has been informed its ok to proceed with vaccination.

## 2019-05-12 NOTE — Telephone Encounter (Signed)
New message:   Pt is calling and states he was advised to call to make sure it is okay for him to get the vaccination due to him being on blood thinners. Please advise.

## 2019-05-15 ENCOUNTER — Inpatient Hospital Stay: Payer: Medicare HMO | Attending: Oncology | Admitting: Oncology

## 2019-05-15 ENCOUNTER — Other Ambulatory Visit: Payer: Self-pay

## 2019-05-15 ENCOUNTER — Encounter: Payer: Self-pay | Admitting: Oncology

## 2019-05-15 ENCOUNTER — Inpatient Hospital Stay: Payer: Medicare HMO

## 2019-05-15 VITALS — BP 151/84 | HR 83 | Temp 98.7°F | Resp 17 | Ht 64.0 in | Wt 197.2 lb

## 2019-05-15 DIAGNOSIS — C220 Liver cell carcinoma: Secondary | ICD-10-CM

## 2019-05-15 DIAGNOSIS — J449 Chronic obstructive pulmonary disease, unspecified: Secondary | ICD-10-CM | POA: Insufficient documentation

## 2019-05-15 DIAGNOSIS — Z5112 Encounter for antineoplastic immunotherapy: Secondary | ICD-10-CM | POA: Insufficient documentation

## 2019-05-15 DIAGNOSIS — K7460 Unspecified cirrhosis of liver: Secondary | ICD-10-CM | POA: Insufficient documentation

## 2019-05-15 DIAGNOSIS — I2510 Atherosclerotic heart disease of native coronary artery without angina pectoris: Secondary | ICD-10-CM | POA: Diagnosis not present

## 2019-05-15 DIAGNOSIS — I110 Hypertensive heart disease with heart failure: Secondary | ICD-10-CM | POA: Insufficient documentation

## 2019-05-15 DIAGNOSIS — I5032 Chronic diastolic (congestive) heart failure: Secondary | ICD-10-CM | POA: Insufficient documentation

## 2019-05-15 DIAGNOSIS — E119 Type 2 diabetes mellitus without complications: Secondary | ICD-10-CM | POA: Diagnosis not present

## 2019-05-15 LAB — CBC WITH DIFFERENTIAL (CANCER CENTER ONLY)
Abs Immature Granulocytes: 0.01 10*3/uL (ref 0.00–0.07)
Basophils Absolute: 0 10*3/uL (ref 0.0–0.1)
Basophils Relative: 1 %
Eosinophils Absolute: 0.2 10*3/uL (ref 0.0–0.5)
Eosinophils Relative: 4 %
HCT: 43 % (ref 39.0–52.0)
Hemoglobin: 13.9 g/dL (ref 13.0–17.0)
Immature Granulocytes: 0 %
Lymphocytes Relative: 36 %
Lymphs Abs: 1.6 10*3/uL (ref 0.7–4.0)
MCH: 27 pg (ref 26.0–34.0)
MCHC: 32.3 g/dL (ref 30.0–36.0)
MCV: 83.7 fL (ref 80.0–100.0)
Monocytes Absolute: 0.3 10*3/uL (ref 0.1–1.0)
Monocytes Relative: 8 %
Neutro Abs: 2.3 10*3/uL (ref 1.7–7.7)
Neutrophils Relative %: 51 %
Platelet Count: 258 10*3/uL (ref 150–400)
RBC: 5.14 MIL/uL (ref 4.22–5.81)
RDW: 15.1 % (ref 11.5–15.5)
WBC Count: 4.4 10*3/uL (ref 4.0–10.5)
nRBC: 0 % (ref 0.0–0.2)

## 2019-05-15 LAB — CMP (CANCER CENTER ONLY)
ALT: 46 U/L — ABNORMAL HIGH (ref 0–44)
AST: 54 U/L — ABNORMAL HIGH (ref 15–41)
Albumin: 3.9 g/dL (ref 3.5–5.0)
Alkaline Phosphatase: 82 U/L (ref 38–126)
Anion gap: 10 (ref 5–15)
BUN: 18 mg/dL (ref 8–23)
CO2: 23 mmol/L (ref 22–32)
Calcium: 9.1 mg/dL (ref 8.9–10.3)
Chloride: 106 mmol/L (ref 98–111)
Creatinine: 1.13 mg/dL (ref 0.61–1.24)
GFR, Est AFR Am: >60 mL/min (ref >60)
GFR, Estimated: >60 mL/min (ref >60)
Glucose, Bld: 327 mg/dL — ABNORMAL HIGH (ref 70–99)
Potassium: 4 mmol/L (ref 3.5–5.1)
Sodium: 139 mmol/L (ref 135–145)
Total Bilirubin: 0.5 mg/dL (ref 0.3–1.2)
Total Protein: 7.6 g/dL (ref 6.5–8.1)

## 2019-05-15 LAB — TOTAL PROTEIN, URINE DIPSTICK: Protein, ur: NEGATIVE mg/dL

## 2019-05-15 MED ORDER — SODIUM CHLORIDE 0.9 % IV SOLN
Freq: Once | INTRAVENOUS | Status: AC
  Filled 2019-05-15: qty 250

## 2019-05-15 MED ORDER — SODIUM CHLORIDE 0.9 % IV SOLN
1200.0000 mg | Freq: Once | INTRAVENOUS | Status: AC
  Administered 2019-05-15: 1200 mg via INTRAVENOUS
  Filled 2019-05-15: qty 20

## 2019-05-15 MED ORDER — SODIUM CHLORIDE 0.9 % IV SOLN
15.0000 mg/kg | Freq: Once | INTRAVENOUS | Status: AC
  Administered 2019-05-15: 1300 mg via INTRAVENOUS
  Filled 2019-05-15: qty 48

## 2019-05-15 NOTE — Patient Instructions (Signed)
[place] Discharge Instructions for Patients Receiving Chemotherapy  Today you received the following chemotherapy agents: Bevacizumab and Atezolizumab.  To help prevent nausea and vomiting after your treatment, we encourage you to take your nausea medication as directed.   If you develop nausea and vomiting that is not controlled by your nausea medication, call the clinic.   BELOW ARE SYMPTOMS THAT SHOULD BE REPORTED IMMEDIATELY:  *FEVER GREATER THAN 100.5 F  *CHILLS WITH OR WITHOUT FEVER  NAUSEA AND VOMITING THAT IS NOT CONTROLLED WITH YOUR NAUSEA MEDICATION  *UNUSUAL SHORTNESS OF BREATH  *UNUSUAL BRUISING OR BLEEDING  TENDERNESS IN MOUTH AND THROAT WITH OR WITHOUT PRESENCE OF ULCERS  *URINARY PROBLEMS  *BOWEL PROBLEMS  UNUSUAL RASH Items with * indicate a potential emergency and should be followed up as soon as possible.  Feel free to call the clinic should you have any questions or concerns. The clinic phone number is [phone].  Please show the [place] at check-in to the Emergency Department and triage nurse.

## 2019-05-15 NOTE — Progress Notes (Signed)
Pt was approved for the $1000 [employer].

## 2019-05-15 NOTE — Progress Notes (Signed)
Per Dr. Stevphen Meuse to treat today despite elevated glucose. No interventions at this time.

## 2019-05-15 NOTE — Progress Notes (Signed)
  [place] OFFICE PROGRESS NOTE   Diagnosis: Hepatocellular carcinoma  INTERVAL HISTORY:   [name] completed another treatment with atezolizumab and Avastin on 04/24/2019.  No rash, diarrhea, bleeding, or symptom of thrombosis.  He feels well.  The Anoro inhaler helps with dyspnea.  Objective:  Vital signs in last 24 hours:  Blood pressure (!) 151/84, pulse 83, temperature 98.7 F (37.1 C), temperature source Temporal, resp. rate 17, height 5\' 4"  (1.626 m), weight 197 lb 3.2 oz (89.4 kg), SpO2 100 %.    GI: No hepatosplenomegaly, no apparent ascites, no mass, nontender Vascular: No leg edema  Skin: No rash, multiple hyperpigmented lesions over the trunk and extremities (chronic per patient report)    Lab Results:  Lab Results  Component Value Date   WBC 4.4 05/15/2019   HGB 13.9 05/15/2019   HCT 43.0 05/15/2019   MCV 83.7 05/15/2019   PLT 258 05/15/2019   NEUTROABS 2.3 05/15/2019    CMP  Lab Results  Component Value Date   NA 134 (L) 04/24/2019   K 3.7 04/24/2019   CL 101 04/24/2019   CO2 22 04/24/2019   GLUCOSE 205 (H) 04/24/2019   BUN 27 (H) 04/24/2019   CREATININE 1.27 (H) 04/24/2019   CALCIUM 9.1 04/24/2019   PROT 8.2 (H) 04/24/2019   ALBUMIN 4.0 04/24/2019   AST 48 (H) 04/24/2019   ALT 48 (H) 04/24/2019   ALKPHOS 92 04/24/2019   BILITOT 0.6 04/24/2019   GFRNONAA 58 (L) 04/24/2019   GFRAA >60 04/24/2019     Medications: I have reviewed the patient's current medications.   Assessment/Plan: 1. Hepatocellular carcinoma  02/12/2019 AFP 10,200  02/18/2019 CT abdomen-dominant poorly marginated heterogeneously enhancing 8.6 x 7.7 cm posterior right liver lobe mass, new compared to CT study 09/10/2016. Numerous similar-appearing liver masses scattered throughout the liver all new since 09/10/2016 CT. Diffusely irregular liver surface compatible with cirrhosis. Spleen normal size. No ascites. No abdominal adenopathy.  MRI liver  03/05/2019-multifocal hepatic metastases including a 10 cm lesion in the right liver,Li-rads 5, no evidence of metastatic disease  Cycle 1 atezolizumab/bevacizumab 03/20/2019  Cycle 2 atezolizumab/bevacizumab 04/24/2019  Cycle 3 atezolizumab/bevacizumab 05/15/2019 2. 01/20/2019 chest CT-no definite evidence of pulmonary embolus. Wall thickening of the distal esophagus. Nodular hepatic contours. 3. Cirrhosis  4. Hepatitis C status post Harvoni 5. Type 2 diabetes 6. COPD 7. CAD 8. Chronic diastolic heart failure 9. Hypertension 10. Positive Cologuard 2019    Disposition: [name] is tolerating the systemic therapy well.  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  We will follow-up on the AFP from today.  He will return for an office visit and cycle 4 atezolizumab/bevacizumab in 3 weeks.  He will be referred for repeat imaging after cycle 4.  Betsy Coder, MD  05/15/2019  11:09 AM

## 2019-05-16 LAB — AFP TUMOR MARKER: AFP, Serum, Tumor Marker: 19029 ng/mL — ABNORMAL HIGH (ref 0.0–8.3)

## 2019-05-18 ENCOUNTER — Telehealth: Payer: Self-pay | Admitting: Oncology

## 2019-05-18 NOTE — Telephone Encounter (Signed)
Scheduled appt per 3/12 los.  Spoke with pt and they are aware of the appt date and time.

## 2019-05-31 ENCOUNTER — Other Ambulatory Visit: Payer: Self-pay | Admitting: Oncology

## 2019-06-01 NOTE — Progress Notes (Signed)
Pharmacist Chemotherapy Monitoring - Follow Up Assessment    I verify that I have reviewed each item in the below checklist:  . Regimen for the patient is scheduled for the appropriate day and plan matches scheduled date. [name] Appropriate non-routine labs are ordered dependent on drug ordered. . If applicable, additional medications reviewed and ordered per protocol based on lifetime cumulative doses and/or treatment regimen.   Plan for follow-up and/or issues identified: No . I-vent associated with next due treatment: No . MD and/or nursing notified: No  Toshia Larkin K 06/01/2019 11:03 AM

## 2019-06-04 ENCOUNTER — Other Ambulatory Visit: Payer: Self-pay | Admitting: *Deleted

## 2019-06-04 DIAGNOSIS — C220 Liver cell carcinoma: Secondary | ICD-10-CM

## 2019-06-05 ENCOUNTER — Other Ambulatory Visit: Payer: Self-pay

## 2019-06-05 ENCOUNTER — Inpatient Hospital Stay: Payer: Medicare HMO | Attending: Nurse Practitioner

## 2019-06-05 ENCOUNTER — Inpatient Hospital Stay: Payer: Medicare HMO

## 2019-06-05 ENCOUNTER — Inpatient Hospital Stay (HOSPITAL_BASED_OUTPATIENT_CLINIC_OR_DEPARTMENT_OTHER): Payer: Medicare HMO | Admitting: Oncology

## 2019-06-05 VITALS — BP 142/77

## 2019-06-05 VITALS — BP 152/77 | HR 100 | Temp 97.8°F | Resp 18 | Ht 64.0 in | Wt 198.0 lb

## 2019-06-05 DIAGNOSIS — N281 Cyst of kidney, acquired: Secondary | ICD-10-CM | POA: Insufficient documentation

## 2019-06-05 DIAGNOSIS — B1920 Unspecified viral hepatitis C without hepatic coma: Secondary | ICD-10-CM | POA: Insufficient documentation

## 2019-06-05 DIAGNOSIS — I2699 Other pulmonary embolism without acute cor pulmonale: Secondary | ICD-10-CM | POA: Diagnosis not present

## 2019-06-05 DIAGNOSIS — I5032 Chronic diastolic (congestive) heart failure: Secondary | ICD-10-CM | POA: Diagnosis not present

## 2019-06-05 DIAGNOSIS — I2510 Atherosclerotic heart disease of native coronary artery without angina pectoris: Secondary | ICD-10-CM | POA: Insufficient documentation

## 2019-06-05 DIAGNOSIS — K7460 Unspecified cirrhosis of liver: Secondary | ICD-10-CM | POA: Insufficient documentation

## 2019-06-05 DIAGNOSIS — E119 Type 2 diabetes mellitus without complications: Secondary | ICD-10-CM | POA: Diagnosis not present

## 2019-06-05 DIAGNOSIS — I110 Hypertensive heart disease with heart failure: Secondary | ICD-10-CM | POA: Diagnosis not present

## 2019-06-05 DIAGNOSIS — C220 Liver cell carcinoma: Secondary | ICD-10-CM

## 2019-06-05 DIAGNOSIS — Z5112 Encounter for antineoplastic immunotherapy: Secondary | ICD-10-CM | POA: Diagnosis not present

## 2019-06-05 DIAGNOSIS — J449 Chronic obstructive pulmonary disease, unspecified: Secondary | ICD-10-CM | POA: Insufficient documentation

## 2019-06-05 LAB — CMP (CANCER CENTER ONLY)
ALT: 50 U/L — ABNORMAL HIGH (ref 0–44)
AST: 51 U/L — ABNORMAL HIGH (ref 15–41)
Albumin: 4.3 g/dL (ref 3.5–5.0)
Alkaline Phosphatase: 79 U/L (ref 38–126)
Anion gap: 17 — ABNORMAL HIGH (ref 5–15)
BUN: 13 mg/dL (ref 8–23)
CO2: 19 mmol/L — ABNORMAL LOW (ref 22–32)
Calcium: 10 mg/dL (ref 8.9–10.3)
Chloride: 102 mmol/L (ref 98–111)
Creatinine: 1.27 mg/dL — ABNORMAL HIGH (ref 0.61–1.24)
GFR, Est AFR Am: >60 mL/min (ref >60)
GFR, Estimated: 58 mL/min — ABNORMAL LOW (ref >60)
Glucose, Bld: 243 mg/dL — ABNORMAL HIGH (ref 70–99)
Potassium: 4 mmol/L (ref 3.5–5.1)
Sodium: 138 mmol/L (ref 135–145)
Total Bilirubin: 0.7 mg/dL (ref 0.3–1.2)
Total Protein: 8.2 g/dL — ABNORMAL HIGH (ref 6.5–8.1)

## 2019-06-05 LAB — CBC WITH DIFFERENTIAL (CANCER CENTER ONLY)
Abs Immature Granulocytes: 0.01 10*3/uL (ref 0.00–0.07)
Basophils Absolute: 0.1 10*3/uL (ref 0.0–0.1)
Basophils Relative: 1 %
Eosinophils Absolute: 0.3 10*3/uL (ref 0.0–0.5)
Eosinophils Relative: 5 %
HCT: 48.8 % (ref 39.0–52.0)
Hemoglobin: 16 g/dL (ref 13.0–17.0)
Immature Granulocytes: 0 %
Lymphocytes Relative: 38 %
Lymphs Abs: 2.2 10*3/uL (ref 0.7–4.0)
MCH: 27.2 pg (ref 26.0–34.0)
MCHC: 32.8 g/dL (ref 30.0–36.0)
MCV: 82.9 fL (ref 80.0–100.0)
Monocytes Absolute: 0.5 10*3/uL (ref 0.1–1.0)
Monocytes Relative: 9 %
Neutro Abs: 2.8 10*3/uL (ref 1.7–7.7)
Neutrophils Relative %: 47 %
Platelet Count: 319 10*3/uL (ref 150–400)
RBC: 5.89 MIL/uL — ABNORMAL HIGH (ref 4.22–5.81)
RDW: 14.7 % (ref 11.5–15.5)
WBC Count: 5.9 10*3/uL (ref 4.0–10.5)
nRBC: 0 % (ref 0.0–0.2)

## 2019-06-05 LAB — TOTAL PROTEIN, URINE DIPSTICK: Protein, ur: NEGATIVE mg/dL

## 2019-06-05 MED ORDER — SODIUM CHLORIDE 0.9 % IV SOLN
1200.0000 mg | Freq: Once | INTRAVENOUS | Status: AC
  Administered 2019-06-05: 1200 mg via INTRAVENOUS
  Filled 2019-06-05: qty 20

## 2019-06-05 MED ORDER — SODIUM CHLORIDE 0.9 % IV SOLN
Freq: Once | INTRAVENOUS | Status: AC
  Filled 2019-06-05: qty 250

## 2019-06-05 MED ORDER — SODIUM CHLORIDE 0.9 % IV SOLN
15.0000 mg/kg | Freq: Once | INTRAVENOUS | Status: AC
  Administered 2019-06-05: 1300 mg via INTRAVENOUS
  Filled 2019-06-05: qty 48

## 2019-06-05 NOTE — Progress Notes (Signed)
  [place] OFFICE PROGRESS NOTE   Diagnosis: Hepatocellular carcinoma  INTERVAL HISTORY:   [name] completed another treatment with atezolizumab and Avastin on 05/15/2019.  No diarrhea, rash, bleeding, or thrombosis.  He feels well.  He is scheduled for the second Covid vaccine next week.  Objective:  Vital signs in last 24 hours:  Blood pressure (!) 152/77, pulse 100, temperature 97.8 F (36.6 C), temperature source Temporal, resp. rate 18, height 5\' 4"  (1.626 m), weight 198 lb (89.8 kg), SpO2 100 %.    Resp: Distant breath sounds, no respiratory distress Cardio: Regular rate and rhythm GI: No hepatosplenomegaly, nontender, no mass Vascular: No leg edema  Skin: Hyperpigmented lesions over the trunk and extremities    Lab Results:  Lab Results  Component Value Date   WBC 5.9 06/05/2019   HGB 16.0 06/05/2019   HCT 48.8 06/05/2019   MCV 82.9 06/05/2019   PLT 319 06/05/2019   NEUTROABS 2.8 06/05/2019    CMP  Lab Results  Component Value Date   NA 138 06/05/2019   K 4.0 06/05/2019   CL 102 06/05/2019   CO2 19 (L) 06/05/2019   GLUCOSE 243 (H) 06/05/2019   BUN 13 06/05/2019   CREATININE 1.27 (H) 06/05/2019   CALCIUM 10.0 06/05/2019   PROT 8.2 (H) 06/05/2019   ALBUMIN 4.3 06/05/2019   AST 51 (H) 06/05/2019   ALT 50 (H) 06/05/2019   ALKPHOS 79 06/05/2019   BILITOT 0.7 06/05/2019   GFRNONAA 58 (L) 06/05/2019   GFRAA >60 06/05/2019     Medications: I have reviewed the patient's current medications.   Assessment/Plan:  1. Hepatocellular carcinoma  02/12/2019 AFP 10,200  02/18/2019 CT abdomen-dominant poorly marginated heterogeneously enhancing 8.6 x 7.7 cm posterior right liver lobe mass, new compared to CT study 09/10/2016. Numerous similar-appearing liver masses scattered throughout the liver all new since 09/10/2016 CT. Diffusely irregular liver surface compatible with cirrhosis. Spleen normal size. No ascites. No abdominal  adenopathy.  MRI liver 03/05/2019-multifocal hepatic metastases including a 10 cm lesion in the right liver,Li-rads 5, no evidence of metastatic disease  Cycle 1 atezolizumab/bevacizumab 03/20/2019  Cycle 2 atezolizumab/bevacizumab 04/24/2019  Cycle 3 atezolizumab/bevacizumab 05/15/2019  Cycle 4 atezolizumab/bevacizumab 06/05/2019 2. 01/20/2019 chest CT-no definite evidence of pulmonary embolus. Wall thickening of the distal esophagus. Nodular hepatic contours. 3. Cirrhosis  4. Hepatitis C status post Harvoni 5. Type 2 diabetes 6. COPD 7. CAD 8. Chronic diastolic heart failure 9. Hypertension 10.Positive Cologuard 2019    Disposition: [name] is tolerating the atezolizumab/bevacizumab well.  There is no clinical evidence of disease progression.  He will undergo a restaging liver MRI prior to an office visit 06/25/2019.  We will check the AFP when he is here on 06/25/2019.  He will receive the second COVID-19 vaccine next week.  Betsy Coder, MD  06/05/2019  11:05 AM

## 2019-06-05 NOTE — Progress Notes (Signed)
Per Dr. Benay Spice: OK to treat before urine protein run today (was negative last tx). Proceed w/tx--will check TSH next tx.

## 2019-06-05 NOTE — Patient Instructions (Signed)
[place] Discharge Instructions for Patients Receiving Chemotherapy  Today you received the following chemotherapy agents: Bevacizumab and Atezolizumab.  To help prevent nausea and vomiting after your treatment, we encourage you to take your nausea medication as directed.   If you develop nausea and vomiting that is not controlled by your nausea medication, call the clinic.   BELOW ARE SYMPTOMS THAT SHOULD BE REPORTED IMMEDIATELY:  *FEVER GREATER THAN 100.5 F  *CHILLS WITH OR WITHOUT FEVER  NAUSEA AND VOMITING THAT IS NOT CONTROLLED WITH YOUR NAUSEA MEDICATION  *UNUSUAL SHORTNESS OF BREATH  *UNUSUAL BRUISING OR BLEEDING  TENDERNESS IN MOUTH AND THROAT WITH OR WITHOUT PRESENCE OF ULCERS  *URINARY PROBLEMS  *BOWEL PROBLEMS  UNUSUAL RASH Items with * indicate a potential emergency and should be followed up as soon as possible.  Feel free to call the clinic should you have any questions or concerns. The clinic phone number is [phone].  Please show the [place] at check-in to the Emergency Department and triage nurse.

## 2019-06-09 ENCOUNTER — Ambulatory Visit: Payer: Medicare HMO | Admitting: Endocrinology

## 2019-06-09 ENCOUNTER — Telehealth: Payer: Self-pay | Admitting: Oncology

## 2019-06-09 NOTE — Telephone Encounter (Signed)
Scheduled per los. Called and spoke with patient. Confirmed appt 

## 2019-06-15 ENCOUNTER — Other Ambulatory Visit: Payer: Self-pay

## 2019-06-15 NOTE — Patient Outreach (Addendum)
[facility]) Care Management  [place]  06/15/2019   [name] 06/27/1950 [id number]  Subjective: Telephone call to patient for monthly call. Patient reports he is doing good. He states that he is more active these days.  He states sugars ranging 80-170. Discussed low sugars and diabetic diet control. He verbalized understanding. He will see Dr. Loanne Drilling this month for routine follow up of diabetes.  Patient continues cancer treatments with no side effects per patient.  Patient to have CT scan later this month and that will help give a status per patient but he states he knows he has a long way to go.  Patient denies any questions or concerns and appreciative of call.     Objective:   Encounter Medications:  Outpatient Encounter Medications as of 06/15/2019  Medication Sig Note  . Accu-Chek FastClix Lancets MISC 1 each by Does not apply route 4 (four) times daily. E11.9   . albuterol (VENTOLIN HFA) 108 (90 Base) MCG/ACT inhaler Inhale 1-2 puffs into the lungs every 4 (four) hours as needed for wheezing or shortness of breath. 02/11/2019: On hand  . Alcohol Swabs (B-D SINGLE USE SWABS REGULAR) PADS Use to swab finger 4 times daily.   Jearl Klinefelter ELLIPTA 62.5-25 MCG/INH AEPB INHALE 1 PUFF INTO THE LUNGS DAILY. NEED OFFICE VISIT FOR FURTHER REFILLS.   [name] aspirin EC 81 MG tablet Take 1 tablet (81 mg total) by mouth daily.   . Blood Glucose Calibration (ACCU-CHEK GUIDE CONTROL) LIQD 1 each by Other route as needed (Use to calibrate meter prn). E11.9   . Blood Glucose Monitoring Suppl (ACCU-CHEK GUIDE ME) w/Device KIT 1 kit by Does not apply route See admin instructions. Check blood sugar 4 times a day   . cholecalciferol (VITAMIN D) 1000 UNITS tablet Take 1 tablet (1,000 Units total) by mouth daily. (Patient taking differently: Take 1,000 Units by mouth 2 (two) times daily. )   . Cinnamon 500 MG capsule Take 500 mg by mouth 2 (two) times daily.   [name] glucose blood (ACCU-CHEK  GUIDE) test strip 1 each by Other route 4 (four) times daily.   . hydrochlorothiazide (HYDRODIURIL) 25 MG tablet Take 1 tablet by mouth once daily   . insulin NPH-regular Human (NOVOLIN 70/30) (70-30) 100 UNIT/ML injection Inject 130 Units into the skin daily with breakfast.   . Insulin Syringe-Needle U-100 (BD VEO INSULIN SYRINGE U/F) 31G X 15/64" 1 ML MISC Use for injecting insulin once a day.   . latanoprost (XALATAN) 0.005 % ophthalmic solution Place 1 drop into both eyes daily.   [name] lisinopril-hydrochlorothiazide (ZESTORETIC) 20-25 MG tablet Take 1 tablet by mouth daily.   . methocarbamol (ROBAXIN) 500 MG tablet Take 1 tablet (500 mg total) by mouth 2 (two) times daily. (Patient not taking: Reported on 06/05/2019)   . metoCLOPramide (REGLAN) 5 MG tablet Take 1 tablet (5 mg total) by mouth every 8 (eight) hours as needed for nausea. (Patient not taking: Reported on 06/05/2019)   . Multiple Vitamins-Minerals (MULTIVITAMIN WITH MINERALS) tablet Take 1 tablet by mouth daily.   . Omega-3 Fatty Acids (FISH OIL) 1000 MG CAPS Take 2 capsules by mouth daily.   . pantoprazole (PROTONIX) 40 MG tablet TAKE 1 TABLET BY MOUTH ONCE DAILY BEFORE  BREAKFAST.   [name] prochlorperazine (COMPAZINE) 10 MG tablet Take 1 tablet (10 mg total) by mouth every 6 (six) hours as needed. (Patient not taking: Reported on 05/15/2019)   . sildenafil (REVATIO) 20 MG tablet TAKE  5 TABLETS BY MOUTH DAILY AS NEEDED   . sildenafil (VIAGRA) 100 MG tablet Take 0.5-1 tablets (50-100 mg total) by mouth daily as needed for erectile dysfunction.   . traZODone (DESYREL) 50 MG tablet Take 1 tablet (50 mg total) by mouth at bedtime as needed for sleep.   . [DISCONTINUED] atorvastatin (LIPITOR) 40 MG tablet Take 1 tablet (40 mg total) by mouth daily.    No facility-administered encounter medications on file as of 06/15/2019.    Functional Status:  In your present state of health, do you have any difficulty performing the following activities:  11/20/2018 09/15/2018  Hearing? N N  Vision? N N  Difficulty concentrating or making decisions? N N  Walking or climbing stairs? N N  Dressing or bathing? N N  Doing errands, shopping? N N  Preparing Food and eating ? N N  Using the Toilet? N N  In the past six months, have you accidently leaked urine? N N  Do you have problems with loss of bowel control? N N  Managing your Medications? N N  Managing your Finances? N N  Housekeeping or managing your Housekeeping? N -  Some recent data might be hidden    Fall/Depression Screening: Fall Risk  05/11/2019 09/15/2018 08/29/2018  Falls in the past year? 0 0 0  Number falls in past yr: - - 0  Follow up - Falls evaluation completed -   PHQ 2/9 Scores 06/15/2019 09/15/2018 09/02/2018 08/29/2018 08/23/2017 06/24/2015 05/10/2015  PHQ - 2 Score 0 0 0 1 1 0 0  PHQ- 9 Score - - - - 2 - -       Assessment:  Patient continues to manage chronic illnesses.  Plan:  [facility] CM Care Plan Problem One     Most Recent Value  Care Plan Problem One  Knowledge Deficit in self management of Diabetes  Role Documenting the Problem One  Care Management Telephonic [place] for Problem One  Active  [facility] Long Term Goal   Patient will  verbalize a decrease in A1C from 9.6 within the next 90 days  [facility] Long Term Goal Start Date  04/13/19  Interventions for Problem One Long Term Goal  Discussed blood sugars with patient.  Patient to see Dr. Loanne Drilling this month.  Patient exercising and monitoring diet.       RN CM will contact patient next month and patient agreeable.    [name], RN, MSN [facility] Care Management Coordinator Direct Line [phone] Cell [phone] Toll Free: [phone]  Fax: [phone]

## 2019-06-17 ENCOUNTER — Other Ambulatory Visit: Payer: Self-pay

## 2019-06-17 ENCOUNTER — Ambulatory Visit: Payer: Medicare HMO | Admitting: Endocrinology

## 2019-06-17 ENCOUNTER — Encounter: Payer: Self-pay | Admitting: Endocrinology

## 2019-06-17 VITALS — BP 144/80 | HR 90 | Ht 64.0 in | Wt 199.0 lb

## 2019-06-17 DIAGNOSIS — Z794 Long term (current) use of insulin: Secondary | ICD-10-CM

## 2019-06-17 DIAGNOSIS — E119 Type 2 diabetes mellitus without complications: Secondary | ICD-10-CM | POA: Diagnosis not present

## 2019-06-17 LAB — POCT GLYCOSYLATED HEMOGLOBIN (HGB A1C): Hemoglobin A1C: 9.4 % — AB (ref 4.0–5.6)

## 2019-06-17 MED ORDER — NOVOLIN 70/30 (70-30) 100 UNIT/ML ~~LOC~~ SUSP: 150.0000 [IU] | Freq: Every day | SUBCUTANEOUS | 11 refills | Status: DC

## 2019-06-17 NOTE — Patient Instructions (Addendum)
check your blood sugar 4 times a day: before the 3 meals, and at bedtime.  also check if you have symptoms of your blood sugar being too high or too low.  please keep a record of the readings and bring it to your next appointment here (or you can bring the meter itself).  You can write it on any piece of paper.  please call us sooner if your blood sugar goes below 70, or if you have a lot of readings over 200. Please increase the insulin to 150 units each morning.  However, if you are going to be active, take just 110 units. On this type of insulin schedule, you should eat meals on a regular schedule (especiallly lunch).  If a meal is missed or significantly delayed, your blood sugar could go low.   Please come back for a follow-up appointment in 2 months.

## 2019-06-17 NOTE — Progress Notes (Signed)
Subjective:    Patient ID: [name], male    DOB: 1951-02-02, 69 y.o.   MRN: [id number]  HPI Pt returns for f/u of diabetes mellitus: DM type: Insulin-requiring type 2.  Dx'ed: 7096 Complications: PAD and renal insuff Therapy: insulin since 2016.   DKA: never Severe hypoglycemia: never.  Pancreatitis: never.   SDOH: he was advised to see CDE to review insulin injections, but he did not go.  Other: he declines multiple daily injections; he is chronically off and on prednisone, for asthma; he changed lantus to NPH, then 70/30, due to pattern of cbg's; ins declines levemir.   Interval history: no cbg record, but states cbg varies from 84-200's.  No recent steroids.  He says he never misses the insulin.  She has mild hypoglycemia in the afternoon, approx once per month.  This happens with activity.   Past Medical History:  Diagnosis Date  . Alcoholism ([place])   . Arthritis    "knees, hands" (03/08/2015)  . Asthma   . Chronic bronchitis ([place])   . Chronic diastolic CHF (congestive heart failure) ([place])    notes 03/08/2015  . Cirrhosis ([place])   . COPD (chronic obstructive pulmonary disease) ([place])   . Coronary artery disease   . Hepatitis C   . High cholesterol   . Hypertension   . Pneumonia 03/2015  . Shortness of breath dyspnea   . Type II diabetes mellitus ([place]) 2014    Past Surgical History:  Procedure Laterality Date  . CATARACT EXTRACTION Left 02/2014  . ESOPHAGOGASTRODUODENOSCOPY  02/09/2011   Procedure: ESOPHAGOGASTRODUODENOSCOPY (EGD);  Surgeon: Missy Sabins, MD;  Location[facility] ENDOSCOPY;  Service: Endoscopy;  Laterality: N/A;  . 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 Marital status: Divorced    Spouse name: Not on file  . Number of children: 4  . Years of education: Not on file  . Highest education level: Not on file  Occupational History  . Occupation: disabled  Tobacco Use  . Smoking status: Former Smoker     Packs/day: 1.50    Years: 46.00    Pack years: 69.00    Types: Cigarettes    Quit date: 03/19/2013    Years since quitting: 6.2  . Smokeless tobacco: Never Used  Substance and Sexual Activity  . Alcohol use: No    Alcohol/week: 0.0 standard drinks    Comment: "recovering alcoholic; 2/83/6629"  . Drug use: No  . Sexual activity: Not Currently  Other Topics Concern  . Not on file  Social History Narrative  . Not on file   Social Determinants of Health   Financial Resource Strain:   . Difficulty of Paying Living Expenses:   Food Insecurity: No Food Insecurity  . Worried About Charity fundraiser in the Last Year: Never true  . Ran Out of Food in the Last Year: Never true  Transportation Needs: No Transportation Needs  . Lack of Transportation (Medical): No  . Lack of Transportation (Non-Medical): No  Physical Activity: Unknown  . Days of Exercise per Week: Not on file  . Minutes of Exercise per Session: 40 min  Stress:   . Feeling of Stress :   Social Connections:   . Frequency of Communication with Friends and Family:   . Frequency of Social Gatherings with Friends and Family:   . Attends Religious Services:   . Active Member of Clubs or Organizations:   . Attends Club  or Organization Meetings:   [name] Marital Status:   Intimate Partner Violence:   . Fear of Current or Ex-Partner:   . Emotionally Abused:   [name] Physically Abused:   . Sexually Abused:     Current Outpatient Medications on File Prior to Visit  Medication Sig Dispense Refill  . Accu-Chek FastClix Lancets MISC 1 each by Does not apply route 4 (four) times daily. E11.9 360 each 0  . albuterol (VENTOLIN HFA) 108 (90 Base) MCG/ACT inhaler Inhale 1-2 puffs into the lungs every 4 (four) hours as needed for wheezing or shortness of breath. 54 each 3  . Alcohol Swabs (B-D SINGLE USE SWABS REGULAR) PADS Use to swab finger 4 times daily. 300 each 11  . ANORO ELLIPTA 62.5-25 MCG/INH AEPB INHALE 1 PUFF INTO THE LUNGS  DAILY. NEED OFFICE VISIT FOR FURTHER REFILLS. 180 each 0  . aspirin EC 81 MG tablet Take 1 tablet (81 mg total) by mouth daily. 30 tablet 3  . Blood Glucose Calibration (ACCU-CHEK GUIDE CONTROL) LIQD 1 each by Other route as needed (Use to calibrate meter prn). E11.9 1 each 0  . Blood Glucose Monitoring Suppl (ACCU-CHEK GUIDE ME) w/Device KIT 1 kit by Does not apply route See admin instructions. Check blood sugar 4 times a day 1 kit 0  . cholecalciferol (VITAMIN D) 1000 UNITS tablet Take 1 tablet (1,000 Units total) by mouth daily. (Patient taking differently: Take 1,000 Units by mouth 2 (two) times daily. ) 30 tablet 0  . Cinnamon 500 MG capsule Take 500 mg by mouth 2 (two) times daily.    [name] glucose blood (ACCU-CHEK GUIDE) test strip 1 each by Other route 4 (four) times daily. 400 each 4  . hydrochlorothiazide (HYDRODIURIL) 25 MG tablet Take 1 tablet by mouth once daily 90 tablet 0  . Insulin Syringe-Needle U-100 (BD VEO INSULIN SYRINGE U/F) 31G X 15/64" 1 ML MISC Use for injecting insulin once a day. 90 each 4  . latanoprost (XALATAN) 0.005 % ophthalmic solution Place 1 drop into both eyes daily.    [name] lisinopril-hydrochlorothiazide (ZESTORETIC) 20-25 MG tablet Take 1 tablet by mouth daily. 90 tablet 3  . methocarbamol (ROBAXIN) 500 MG tablet Take 1 tablet (500 mg total) by mouth 2 (two) times daily. 20 tablet 0  . metoCLOPramide (REGLAN) 5 MG tablet Take 1 tablet (5 mg total) by mouth every 8 (eight) hours as needed for nausea. 60 tablet 0  . Multiple Vitamins-Minerals (MULTIVITAMIN WITH MINERALS) tablet Take 1 tablet by mouth daily.    . Omega-3 Fatty Acids (FISH OIL) 1000 MG CAPS Take 2 capsules by mouth daily.    . pantoprazole (PROTONIX) 40 MG tablet TAKE 1 TABLET BY MOUTH ONCE DAILY BEFORE  BREAKFAST. 30 tablet 1  . prochlorperazine (COMPAZINE) 10 MG tablet Take 1 tablet (10 mg total) by mouth every 6 (six) hours as needed. 60 tablet 1  . sildenafil (REVATIO) 20 MG tablet TAKE 5 TABLETS BY  MOUTH DAILY AS NEEDED 10 tablet 6  . sildenafil (VIAGRA) 100 MG tablet Take 0.5-1 tablets (50-100 mg total) by mouth daily as needed for erectile dysfunction. 5 tablet 3  . traZODone (DESYREL) 50 MG tablet Take 1 tablet (50 mg total) by mouth at bedtime as needed for sleep. 90 tablet 3  . [DISCONTINUED] atorvastatin (LIPITOR) 40 MG tablet Take 1 tablet (40 mg total) by mouth daily. 90 tablet 3   No current facility-administered medications on file prior to visit.    Allergies  Allergen  Reactions  . Crestor [Rosuvastatin] Other (See Comments)    INTOLERANCE   . Metformin And Related Other (See Comments)    Stomach ache  . Mobic [Meloxicam] Rash    Family History  Problem Relation Age of Onset  . Stroke Mother   . Diabetes Mother   . Hypertension Mother   . Hyperlipidemia Mother   . Heart disease Father   . Hypertension Father   . Heart attack Father     BP (!) 144/80   Pulse 90   Ht '5\' 4"'  (1.626 m)   Wt 199 lb (90.3 kg)   SpO2 98%   BMI 34.16 kg/m    Review of Systems Denies LOC    Objective:   Physical Exam VITAL SIGNS:  See vs page GENERAL: no distress Pulses: dorsalis pedis intact bilat.   MSK: no deformity of the feet CV: no leg edema Skin:  no ulcer on the feet.  normal color and temp on the feet. Neuro: sensation is intact to touch on the feet  Lab Results  Component Value Date   HGBA1C 9.4 (A) 06/17/2019       Assessment & Plan:  Insulin-requiring type 2 DM, with PAD: he needs increased rx Hepatocellular CA: he is not a candidate for aggressive glycemic control.  Hypoglycemia, due to insulin: this limits aggressiveness of glycemic control  Patient Instructions  check your blood sugar 4 times a day: before the 3 meals, and at bedtime.  also check if you have symptoms of your blood sugar being too high or too low.  please keep a record of the readings and bring it to your next appointment here (or you can bring the meter itself).  You can write it on  any piece of paper.  please call us sooner if your blood sugar goes below 70, or if you have a lot of readings over 200. Please increase the insulin to 150 units each morning.  However, if you are going to be active, take just 110 units. On this type of insulin schedule, you should eat meals on a regular schedule (especiallly lunch).  If a meal is missed or significantly delayed, your blood sugar could go low.   Please come back for a follow-up appointment in 2 months.

## 2019-06-18 ENCOUNTER — Telehealth: Payer: Self-pay | Admitting: Internal Medicine

## 2019-06-18 DIAGNOSIS — H40013 Open angle with borderline findings, low risk, bilateral: Secondary | ICD-10-CM | POA: Diagnosis not present

## 2019-06-18 DIAGNOSIS — H11153 Pinguecula, bilateral: Secondary | ICD-10-CM | POA: Diagnosis not present

## 2019-06-18 DIAGNOSIS — H1045 Other chronic allergic conjunctivitis: Secondary | ICD-10-CM | POA: Diagnosis not present

## 2019-06-18 NOTE — Telephone Encounter (Signed)
You do not need a referral for chiropractor he can just schedule that.
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
## 2019-06-19 NOTE — Telephone Encounter (Signed)
Notified pt w/MD response.../lmb 

## 2019-06-19 NOTE — Progress Notes (Signed)
Pharmacist Chemotherapy Monitoring - Follow Up Assessment    I verify that I have reviewed each item in the below checklist:  . Regimen for the patient is scheduled for the appropriate day and plan matches scheduled date. [name] Appropriate non-routine labs are ordered dependent on drug ordered. . If applicable, additional medications reviewed and ordered per protocol based on lifetime cumulative doses and/or treatment regimen.   Plan for follow-up and/or issues identified: No . I-vent associated with next due treatment: No . MD and/or nursing notified: No  Acquanetta Belling 06/19/2019 9:20 AM

## 2019-06-24 ENCOUNTER — Ambulatory Visit (HOSPITAL_COMMUNITY)
Admission: RE | Admit: 2019-06-24 | Discharge: 2019-06-24 | Disposition: A | Discharge status: Home / Self Care | Payer: Medicare HMO | Source: Ambulatory Visit | Attending: Oncology | Admitting: Oncology

## 2019-06-24 ENCOUNTER — Other Ambulatory Visit: Payer: Self-pay

## 2019-06-24 ENCOUNTER — Other Ambulatory Visit: Payer: Self-pay | Admitting: Oncology

## 2019-06-24 DIAGNOSIS — C220 Liver cell carcinoma: Secondary | ICD-10-CM | POA: Insufficient documentation

## 2019-06-24 MED ORDER — GADOBUTROL 1 MMOL/ML IV SOLN
9.0000 mL | Freq: Once | INTRAVENOUS | Status: AC | PRN
  Administered 2019-06-24: 9 mL via INTRAVENOUS

## 2019-06-25 ENCOUNTER — Other Ambulatory Visit: Payer: Self-pay

## 2019-06-25 ENCOUNTER — Inpatient Hospital Stay: Payer: Medicare HMO

## 2019-06-25 ENCOUNTER — Encounter: Payer: Self-pay | Admitting: Nurse Practitioner

## 2019-06-25 ENCOUNTER — Inpatient Hospital Stay: Payer: Medicare HMO | Admitting: Nurse Practitioner

## 2019-06-25 VITALS — BP 154/71 | HR 97 | Temp 98.0°F | Resp 17 | Ht 64.0 in | Wt 195.9 lb

## 2019-06-25 DIAGNOSIS — Z5112 Encounter for antineoplastic immunotherapy: Secondary | ICD-10-CM | POA: Diagnosis not present

## 2019-06-25 DIAGNOSIS — C220 Liver cell carcinoma: Secondary | ICD-10-CM

## 2019-06-25 DIAGNOSIS — E119 Type 2 diabetes mellitus without complications: Secondary | ICD-10-CM | POA: Diagnosis not present

## 2019-06-25 DIAGNOSIS — I2699 Other pulmonary embolism without acute cor pulmonale: Secondary | ICD-10-CM | POA: Diagnosis not present

## 2019-06-25 DIAGNOSIS — B1920 Unspecified viral hepatitis C without hepatic coma: Secondary | ICD-10-CM | POA: Diagnosis not present

## 2019-06-25 DIAGNOSIS — N281 Cyst of kidney, acquired: Secondary | ICD-10-CM | POA: Diagnosis not present

## 2019-06-25 DIAGNOSIS — K7460 Unspecified cirrhosis of liver: Secondary | ICD-10-CM | POA: Diagnosis not present

## 2019-06-25 DIAGNOSIS — I5032 Chronic diastolic (congestive) heart failure: Secondary | ICD-10-CM | POA: Diagnosis not present

## 2019-06-25 DIAGNOSIS — I110 Hypertensive heart disease with heart failure: Secondary | ICD-10-CM | POA: Diagnosis not present

## 2019-06-25 LAB — CBC WITH DIFFERENTIAL (CANCER CENTER ONLY)
Abs Immature Granulocytes: 0.01 10*3/uL (ref 0.00–0.07)
Basophils Absolute: 0.1 10*3/uL (ref 0.0–0.1)
Basophils Relative: 1 %
Eosinophils Absolute: 0.3 10*3/uL (ref 0.0–0.5)
Eosinophils Relative: 5 %
HCT: 47.6 % (ref 39.0–52.0)
Hemoglobin: 15.6 g/dL (ref 13.0–17.0)
Immature Granulocytes: 0 %
Lymphocytes Relative: 33 %
Lymphs Abs: 2.2 10*3/uL (ref 0.7–4.0)
MCH: 27.3 pg (ref 26.0–34.0)
MCHC: 32.8 g/dL (ref 30.0–36.0)
MCV: 83.2 fL (ref 80.0–100.0)
Monocytes Absolute: 0.6 10*3/uL (ref 0.1–1.0)
Monocytes Relative: 8 %
Neutro Abs: 3.5 10*3/uL (ref 1.7–7.7)
Neutrophils Relative %: 53 %
Platelet Count: 301 10*3/uL (ref 150–400)
RBC: 5.72 MIL/uL (ref 4.22–5.81)
RDW: 15.1 % (ref 11.5–15.5)
WBC Count: 6.6 10*3/uL (ref 4.0–10.5)
nRBC: 0 % (ref 0.0–0.2)

## 2019-06-25 LAB — CMP (CANCER CENTER ONLY)
ALT: 61 U/L — ABNORMAL HIGH (ref 0–44)
AST: 56 U/L — ABNORMAL HIGH (ref 15–41)
Albumin: 4 g/dL (ref 3.5–5.0)
Alkaline Phosphatase: 79 U/L (ref 38–126)
Anion gap: 10 (ref 5–15)
BUN: 25 mg/dL — ABNORMAL HIGH (ref 8–23)
CO2: 22 mmol/L (ref 22–32)
Calcium: 9.2 mg/dL (ref 8.9–10.3)
Chloride: 104 mmol/L (ref 98–111)
Creatinine: 1.34 mg/dL — ABNORMAL HIGH (ref 0.61–1.24)
GFR, Est AFR Am: >60 mL/min (ref >60)
GFR, Estimated: 54 mL/min — ABNORMAL LOW (ref >60)
Glucose, Bld: 196 mg/dL — ABNORMAL HIGH (ref 70–99)
Potassium: 4.5 mmol/L (ref 3.5–5.1)
Sodium: 136 mmol/L (ref 135–145)
Total Bilirubin: 0.6 mg/dL (ref 0.3–1.2)
Total Protein: 8.1 g/dL (ref 6.5–8.1)

## 2019-06-25 LAB — TOTAL PROTEIN, URINE DIPSTICK: Protein, ur: NEGATIVE mg/dL

## 2019-06-25 LAB — TSH: TSH: 1.423 u[IU]/mL (ref 0.320–4.118)

## 2019-06-25 MED ORDER — SODIUM CHLORIDE 0.9 % IV SOLN
Freq: Once | INTRAVENOUS | Status: AC
  Filled 2019-06-25: qty 250

## 2019-06-25 MED ORDER — SODIUM CHLORIDE 0.9 % IV SOLN
15.0000 mg/kg | Freq: Once | INTRAVENOUS | Status: AC
  Administered 2019-06-25: 1300 mg via INTRAVENOUS
  Filled 2019-06-25: qty 4

## 2019-06-25 MED ORDER — SODIUM CHLORIDE 0.9 % IV SOLN
1200.0000 mg | Freq: Once | INTRAVENOUS | Status: AC
  Administered 2019-06-25: 15:00:00 1200 mg via INTRAVENOUS
  Filled 2019-06-25: qty 20

## 2019-06-25 NOTE — Patient Instructions (Signed)
[facility] Discharge Instructions for Patients Receiving Chemotherapy  Today you received the following chemotherapy agents: Bevacizumab and Atezolizumab.  To help prevent nausea and vomiting after your treatment, we encourage you to take your nausea medication as directed.   If you develop nausea and vomiting that is not controlled by your nausea medication, call the clinic.   BELOW ARE SYMPTOMS THAT SHOULD BE REPORTED IMMEDIATELY:  *FEVER GREATER THAN 100.5 F  *CHILLS WITH OR WITHOUT FEVER  NAUSEA AND VOMITING THAT IS NOT CONTROLLED WITH YOUR NAUSEA MEDICATION  *UNUSUAL SHORTNESS OF BREATH  *UNUSUAL BRUISING OR BLEEDING  TENDERNESS IN MOUTH AND THROAT WITH OR WITHOUT PRESENCE OF ULCERS  *URINARY PROBLEMS  *BOWEL PROBLEMS  UNUSUAL RASH Items with * indicate a potential emergency and should be followed up as soon as possible.  Feel free to call the clinic should you have any questions or concerns. The clinic phone number is [phone].  Please show the [place] at check-in to the Emergency Department and triage nurse.
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
## 2019-06-26 ENCOUNTER — Telehealth: Payer: Self-pay | Admitting: Nurse Practitioner

## 2019-06-26 LAB — AFP TUMOR MARKER: AFP, Serum, Tumor Marker: 18744 ng/mL — ABNORMAL HIGH (ref 0.0–8.3)

## 2019-06-26 NOTE — Telephone Encounter (Signed)
Scheduled appt per 4/22 LOS - pt aware of appt added. Will check mychart

## 2019-07-03 ENCOUNTER — Other Ambulatory Visit: Payer: Self-pay

## 2019-07-03 ENCOUNTER — Ambulatory Visit (INDEPENDENT_AMBULATORY_CARE_PROVIDER_SITE_OTHER): Payer: Medicare HMO | Admitting: Internal Medicine

## 2019-07-03 ENCOUNTER — Encounter: Payer: Self-pay | Admitting: Internal Medicine

## 2019-07-03 VITALS — BP 150/82 | HR 78 | Temp 98.0°F | Ht 64.0 in | Wt 197.0 lb

## 2019-07-03 DIAGNOSIS — J4521 Mild intermittent asthma with (acute) exacerbation: Secondary | ICD-10-CM

## 2019-07-03 DIAGNOSIS — L299 Pruritus, unspecified: Secondary | ICD-10-CM | POA: Diagnosis not present

## 2019-07-03 MED ORDER — HYDROXYZINE HCL 25 MG PO TABS: 25.0000 mg | ORAL_TABLET | Freq: Three times a day (TID) | ORAL | 0 refills | Status: DC | PRN

## 2019-07-03 MED ORDER — FLUTICASONE-SALMETEROL 100-50 MCG/DOSE IN AEPB: 1.0000 | INHALATION_SPRAY | Freq: Two times a day (BID) | RESPIRATORY_TRACT | 3 refills | Status: AC

## 2019-07-03 MED ORDER — BETAMETHASONE VALERATE 0.1 % EX OINT: 1.0000 "application " | TOPICAL_OINTMENT | Freq: Two times a day (BID) | CUTANEOUS | 3 refills | Status: DC

## 2019-07-03 NOTE — Assessment & Plan Note (Signed)
Rx hydroxyzine to see if this can help with itching. I suspect this is related to change in liver function with the [facility]. Okay to continue meds without changes today. Rx betamethasone for the rash to help. Avoid scratching.

## 2019-07-03 NOTE — Patient Instructions (Addendum)
We have sent in hydroxyzine to take up to 3 times a day for the itching.  We have sent in a cream to use twice a day.   We have sent in a new medicine to replace the anoro since that will not be covered.

## 2019-07-03 NOTE — Assessment & Plan Note (Signed)
Needs replacement for anoro. Rx for advair. No flare today. Has plenty of albuterol at home.

## 2019-07-03 NOTE — Progress Notes (Signed)
   Subjective:   Patient ID: [name], male    DOB: April 02, 1950, 69 y.o.   MRN: [id number]  HPI The patient is a 70 YO man coming in for ongoing rash and new itching. Currently undergoing treatment for [facility] in the setting of prior cirrhosis. He is doing well with treatment. The itching started prior to chemotherapy. May have correlated better with diagnosis of the [facility]. Denies fevers or chills. Rash is on stomach and legs. Not on hands/feet. Was started on zyrtec daily to help by oncologist but this is not helping.   Review of Systems  Constitutional: Negative.   HENT: Negative.   Eyes: Negative.   Respiratory: Negative for cough, chest tightness and shortness of breath.   Cardiovascular: Negative for chest pain, palpitations and leg swelling.  Gastrointestinal: Negative for abdominal distention, abdominal pain, constipation, diarrhea, nausea and vomiting.  Musculoskeletal: Negative.   Skin: Positive for rash.       itching  Neurological: Negative.   Psychiatric/Behavioral: Negative.     Objective:  Physical Exam Constitutional:      Appearance: He is well-developed.  HENT:     Head: Normocephalic and atraumatic.  Cardiovascular:     Rate and Rhythm: Normal rate and regular rhythm.  Pulmonary:     Effort: Pulmonary effort is normal. No respiratory distress.     Breath sounds: Normal breath sounds. No wheezing or rales.  Abdominal:     General: Bowel sounds are normal. There is no distension.     Palpations: Abdomen is soft.     Tenderness: There is no abdominal tenderness. There is no rebound.  Musculoskeletal:     Cervical back: Normal range of motion.  Skin:    General: Skin is warm and dry.     Findings: Rash present.     Comments: Rash on the stomach with macular dark lesions which are itchy, legs with chronic changes consistent with prior  Neurological:     Mental Status: He is alert and oriented to person, place, and time.     Coordination: Coordination normal.      Vitals:   07/03/19 0926  BP: (!) 150/82  Pulse: 78  Temp: 98 F (36.7 C)  SpO2: 99%  Weight: 197 lb (89.4 kg)  Height: 5\' 4"  (1.626 m)    This visit occurred during the SARS-CoV-2 public health emergency.  Safety protocols were in place, including screening questions prior to the visit, additional usage of staff PPE, and extensive cleaning of exam room while observing appropriate contact time as indicated for disinfecting solutions.   Assessment & Plan:

## 2019-07-08 ENCOUNTER — Telehealth: Payer: Self-pay | Admitting: Internal Medicine
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
## 2019-07-09 NOTE — Telephone Encounter (Signed)
Spoke with patient and info given 

## 2019-07-09 NOTE — Telephone Encounter (Signed)
Can do calcium or magnesium over the counter and be sure to stay well hydrated.

## 2019-07-12 ENCOUNTER — Other Ambulatory Visit: Payer: Self-pay | Admitting: Oncology

## 2019-07-13 ENCOUNTER — Other Ambulatory Visit: Payer: Self-pay

## 2019-07-13 NOTE — Progress Notes (Signed)
Pharmacist Chemotherapy Monitoring - Follow Up Assessment    I verify that I have reviewed each item in the below checklist:  . Regimen for the patient is scheduled for the appropriate day and plan matches scheduled date. [name] Appropriate non-routine labs are ordered dependent on drug ordered. . If applicable, additional medications reviewed and ordered per protocol based on lifetime cumulative doses and/or treatment regimen.   Plan for follow-up and/or issues identified: No . I-vent associated with next due treatment: No . MD and/or nursing notified: No  Bren Borys K 07/13/2019 9:47 AM

## 2019-07-13 NOTE — Patient Outreach (Signed)
[place] [facility]) Care Management  07/13/2019  HRISHI EKSTROM 1950/07/23 [id number]   Telephone call to patient for disease management follow up.  No answer.  HIPAA compliant voice message left.    Plan: RN CM will attempt patient again in the month of June and send letter.  [name], RN, MSN [place] [facility] Care Management Coordinator Direct Line [phone] Cell [phone] Toll Free: [phone]  Fax: [phone]

## 2019-07-16 ENCOUNTER — Other Ambulatory Visit: Payer: Self-pay | Admitting: Internal Medicine

## 2019-07-17 ENCOUNTER — Inpatient Hospital Stay (HOSPITAL_BASED_OUTPATIENT_CLINIC_OR_DEPARTMENT_OTHER): Payer: Medicare HMO | Admitting: Oncology

## 2019-07-17 ENCOUNTER — Inpatient Hospital Stay: Payer: Medicare HMO

## 2019-07-17 ENCOUNTER — Inpatient Hospital Stay: Payer: Medicare HMO | Attending: Nurse Practitioner

## 2019-07-17 ENCOUNTER — Other Ambulatory Visit: Payer: Self-pay

## 2019-07-17 VITALS — BP 143/79 | HR 82 | Temp 98.3°F | Resp 17 | Ht 64.0 in | Wt 197.0 lb

## 2019-07-17 DIAGNOSIS — Z5112 Encounter for antineoplastic immunotherapy: Secondary | ICD-10-CM | POA: Insufficient documentation

## 2019-07-17 DIAGNOSIS — C220 Liver cell carcinoma: Secondary | ICD-10-CM | POA: Insufficient documentation

## 2019-07-17 DIAGNOSIS — R21 Rash and other nonspecific skin eruption: Secondary | ICD-10-CM | POA: Insufficient documentation

## 2019-07-17 LAB — CMP (CANCER CENTER ONLY)
ALT: 58 U/L — ABNORMAL HIGH (ref 0–44)
AST: 62 U/L — ABNORMAL HIGH (ref 15–41)
Albumin: 3.9 g/dL (ref 3.5–5.0)
Alkaline Phosphatase: 92 U/L (ref 38–126)
Anion gap: 13 (ref 5–15)
BUN: 19 mg/dL (ref 8–23)
CO2: 20 mmol/L — ABNORMAL LOW (ref 22–32)
Calcium: 9.5 mg/dL (ref 8.9–10.3)
Chloride: 102 mmol/L (ref 98–111)
Creatinine: 1.21 mg/dL (ref 0.61–1.24)
GFR, Est AFR Am: >60 mL/min (ref >60)
GFR, Estimated: >60 mL/min (ref >60)
Glucose, Bld: 96 mg/dL (ref 70–99)
Potassium: 4.2 mmol/L (ref 3.5–5.1)
Sodium: 135 mmol/L (ref 135–145)
Total Bilirubin: 0.8 mg/dL (ref 0.3–1.2)
Total Protein: 8 g/dL (ref 6.5–8.1)

## 2019-07-17 LAB — TOTAL PROTEIN, URINE DIPSTICK: Protein, ur: NEGATIVE mg/dL

## 2019-07-17 LAB — CBC WITH DIFFERENTIAL (CANCER CENTER ONLY)
Abs Immature Granulocytes: 0.01 10*3/uL (ref 0.00–0.07)
Basophils Absolute: 0.1 10*3/uL (ref 0.0–0.1)
Basophils Relative: 1 %
Eosinophils Absolute: 0.3 10*3/uL (ref 0.0–0.5)
Eosinophils Relative: 4 %
HCT: 48.3 % (ref 39.0–52.0)
Hemoglobin: 16 g/dL (ref 13.0–17.0)
Immature Granulocytes: 0 %
Lymphocytes Relative: 35 %
Lymphs Abs: 2.3 10*3/uL (ref 0.7–4.0)
MCH: 27 pg (ref 26.0–34.0)
MCHC: 33.1 g/dL (ref 30.0–36.0)
MCV: 81.5 fL (ref 80.0–100.0)
Monocytes Absolute: 0.6 10*3/uL (ref 0.1–1.0)
Monocytes Relative: 9 %
Neutro Abs: 3.3 10*3/uL (ref 1.7–7.7)
Neutrophils Relative %: 51 %
Platelet Count: 348 10*3/uL (ref 150–400)
RBC: 5.93 MIL/uL — ABNORMAL HIGH (ref 4.22–5.81)
RDW: 14.4 % (ref 11.5–15.5)
WBC Count: 6.5 10*3/uL (ref 4.0–10.5)
nRBC: 0 % (ref 0.0–0.2)

## 2019-07-17 MED ORDER — SODIUM CHLORIDE 0.9 % IV SOLN
15.0000 mg/kg | Freq: Once | INTRAVENOUS | Status: AC
  Administered 2019-07-17: 1300 mg via INTRAVENOUS
  Filled 2019-07-17: qty 48

## 2019-07-17 MED ORDER — SODIUM CHLORIDE 0.9 % IV SOLN
1200.0000 mg | Freq: Once | INTRAVENOUS | Status: AC
  Administered 2019-07-17: 1200 mg via INTRAVENOUS
  Filled 2019-07-17: qty 20

## 2019-07-17 MED ORDER — SODIUM CHLORIDE 0.9 % IV SOLN
Freq: Once | INTRAVENOUS | Status: AC
  Filled 2019-07-17: qty 250
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
## 2019-07-17 NOTE — Patient Instructions (Signed)
[facility] Discharge Instructions for Patients Receiving Chemotherapy  Today you received the following chemotherapy agents: atezolizumab and bevacizumab.  To help prevent nausea and vomiting after your treatment, we encourage you to take your nausea medication as directed.    If you develop nausea and vomiting that is not controlled by your nausea medication, call the clinic.   BELOW ARE SYMPTOMS THAT SHOULD BE REPORTED IMMEDIATELY:  *FEVER GREATER THAN 100.5 F  *CHILLS WITH OR WITHOUT FEVER  NAUSEA AND VOMITING THAT IS NOT CONTROLLED WITH YOUR NAUSEA MEDICATION  *UNUSUAL SHORTNESS OF BREATH  *UNUSUAL BRUISING OR BLEEDING  TENDERNESS IN MOUTH AND THROAT WITH OR WITHOUT PRESENCE OF ULCERS  *URINARY PROBLEMS  *BOWEL PROBLEMS  UNUSUAL RASH Items with * indicate a potential emergency and should be followed up as soon as possible.  Feel free to call the clinic should you have any questions or concerns. The clinic phone number is [phone].  Please show the CHEMO ALERT CARD at check-in to the Emergency Department and triage nurse.   

## 2019-07-21 ENCOUNTER — Telehealth: Payer: Self-pay | Admitting: Oncology

## 2019-07-21 NOTE — Telephone Encounter (Signed)
Scheduled per los. Called and left msg. Mailed printout  °

## 2019-07-31 NOTE — Progress Notes (Signed)
Pharmacist Chemotherapy Monitoring - Follow Up Assessment    I verify that I have reviewed each item in the below checklist:  . Regimen for the patient is scheduled for the appropriate day and plan matches scheduled date. [name] Appropriate non-routine labs are ordered dependent on drug ordered. . If applicable, additional medications reviewed and ordered per protocol based on lifetime cumulative doses and/or treatment regimen.   Plan for follow-up and/or issues identified: No . I-vent associated with next due treatment: No . MD and/or nursing notified: No  [name] 07/31/2019 11:12 AM

## 2019-08-02 ENCOUNTER — Other Ambulatory Visit: Payer: Self-pay | Admitting: Oncology

## 2019-08-07 ENCOUNTER — Encounter: Payer: Self-pay | Admitting: Nurse Practitioner

## 2019-08-07 ENCOUNTER — Inpatient Hospital Stay: Payer: Medicare HMO | Attending: Nurse Practitioner | Admitting: Nurse Practitioner

## 2019-08-07 ENCOUNTER — Inpatient Hospital Stay: Payer: Medicare HMO

## 2019-08-07 ENCOUNTER — Other Ambulatory Visit: Payer: Self-pay

## 2019-08-07 VITALS — BP 152/74 | HR 97 | Temp 97.7°F | Resp 19 | Ht 64.0 in | Wt 195.7 lb

## 2019-08-07 DIAGNOSIS — J449 Chronic obstructive pulmonary disease, unspecified: Secondary | ICD-10-CM | POA: Diagnosis not present

## 2019-08-07 DIAGNOSIS — I5032 Chronic diastolic (congestive) heart failure: Secondary | ICD-10-CM | POA: Diagnosis not present

## 2019-08-07 DIAGNOSIS — I110 Hypertensive heart disease with heart failure: Secondary | ICD-10-CM | POA: Insufficient documentation

## 2019-08-07 DIAGNOSIS — R21 Rash and other nonspecific skin eruption: Secondary | ICD-10-CM | POA: Insufficient documentation

## 2019-08-07 DIAGNOSIS — I2510 Atherosclerotic heart disease of native coronary artery without angina pectoris: Secondary | ICD-10-CM | POA: Diagnosis not present

## 2019-08-07 DIAGNOSIS — G8929 Other chronic pain: Secondary | ICD-10-CM | POA: Insufficient documentation

## 2019-08-07 DIAGNOSIS — C220 Liver cell carcinoma: Secondary | ICD-10-CM | POA: Diagnosis not present

## 2019-08-07 DIAGNOSIS — K7460 Unspecified cirrhosis of liver: Secondary | ICD-10-CM | POA: Diagnosis not present

## 2019-08-07 DIAGNOSIS — E119 Type 2 diabetes mellitus without complications: Secondary | ICD-10-CM | POA: Insufficient documentation

## 2019-08-07 DIAGNOSIS — Z5112 Encounter for antineoplastic immunotherapy: Secondary | ICD-10-CM | POA: Diagnosis not present

## 2019-08-07 DIAGNOSIS — M549 Dorsalgia, unspecified: Secondary | ICD-10-CM | POA: Insufficient documentation

## 2019-08-07 LAB — CMP (CANCER CENTER ONLY)
ALT: 76 U/L — ABNORMAL HIGH (ref 0–44)
AST: 61 U/L — ABNORMAL HIGH (ref 15–41)
Albumin: 3.8 g/dL (ref 3.5–5.0)
Alkaline Phosphatase: 96 U/L (ref 38–126)
Anion gap: 12 (ref 5–15)
BUN: 18 mg/dL (ref 8–23)
CO2: 21 mmol/L — ABNORMAL LOW (ref 22–32)
Calcium: 9.6 mg/dL (ref 8.9–10.3)
Chloride: 103 mmol/L (ref 98–111)
Creatinine: 1.36 mg/dL — ABNORMAL HIGH (ref 0.61–1.24)
GFR, Est AFR Am: >60 mL/min (ref >60)
GFR, Estimated: 53 mL/min — ABNORMAL LOW (ref >60)
Glucose, Bld: 229 mg/dL — ABNORMAL HIGH (ref 70–99)
Potassium: 3.9 mmol/L (ref 3.5–5.1)
Sodium: 136 mmol/L (ref 135–145)
Total Bilirubin: 0.7 mg/dL (ref 0.3–1.2)
Total Protein: 7.5 g/dL (ref 6.5–8.1)

## 2019-08-07 LAB — CBC WITH DIFFERENTIAL (CANCER CENTER ONLY)
Abs Immature Granulocytes: 0.01 10*3/uL (ref 0.00–0.07)
Basophils Absolute: 0.1 10*3/uL (ref 0.0–0.1)
Basophils Relative: 1 %
Eosinophils Absolute: 0.2 10*3/uL (ref 0.0–0.5)
Eosinophils Relative: 3 %
HCT: 44.8 % (ref 39.0–52.0)
Hemoglobin: 15 g/dL (ref 13.0–17.0)
Immature Granulocytes: 0 %
Lymphocytes Relative: 32 %
Lymphs Abs: 2.4 10*3/uL (ref 0.7–4.0)
MCH: 27.4 pg (ref 26.0–34.0)
MCHC: 33.5 g/dL (ref 30.0–36.0)
MCV: 81.9 fL (ref 80.0–100.0)
Monocytes Absolute: 0.6 10*3/uL (ref 0.1–1.0)
Monocytes Relative: 9 %
Neutro Abs: 4 10*3/uL (ref 1.7–7.7)
Neutrophils Relative %: 55 %
Platelet Count: 297 10*3/uL (ref 150–400)
RBC: 5.47 MIL/uL (ref 4.22–5.81)
RDW: 14.5 % (ref 11.5–15.5)
WBC Count: 7.3 10*3/uL (ref 4.0–10.5)
nRBC: 0 % (ref 0.0–0.2)

## 2019-08-07 LAB — TOTAL PROTEIN, URINE DIPSTICK: Protein, ur: NEGATIVE mg/dL

## 2019-08-07 MED ORDER — SODIUM CHLORIDE 0.9 % IV SOLN
1200.0000 mg | Freq: Once | INTRAVENOUS | Status: AC
  Administered 2019-08-07: 1200 mg via INTRAVENOUS
  Filled 2019-08-07: qty 20

## 2019-08-07 MED ORDER — SODIUM CHLORIDE 0.9 % IV SOLN
Freq: Once | INTRAVENOUS | Status: AC
  Filled 2019-08-07: qty 250

## 2019-08-07 MED ORDER — SODIUM CHLORIDE 0.9 % IV SOLN
15.0000 mg/kg | Freq: Once | INTRAVENOUS | Status: AC
  Administered 2019-08-07: 1300 mg via INTRAVENOUS
  Filled 2019-08-07: qty 48

## 2019-08-07 NOTE — Patient Instructions (Signed)
[place] Discharge Instructions for Patients Receiving Chemotherapy  Today you received the following chemotherapy agents Tecentriq and Bevacizumab  To help prevent nausea and vomiting after your treatment, we encourage you to take your nausea medication as directed.    If you develop nausea and vomiting that is not controlled by your nausea medication, call the clinic.   BELOW ARE SYMPTOMS THAT SHOULD BE REPORTED IMMEDIATELY:  *FEVER GREATER THAN 100.5 F  *CHILLS WITH OR WITHOUT FEVER  NAUSEA AND VOMITING THAT IS NOT CONTROLLED WITH YOUR NAUSEA MEDICATION  *UNUSUAL SHORTNESS OF BREATH  *UNUSUAL BRUISING OR BLEEDING  TENDERNESS IN MOUTH AND THROAT WITH OR WITHOUT PRESENCE OF ULCERS  *URINARY PROBLEMS  *BOWEL PROBLEMS  UNUSUAL RASH Items with * indicate a potential emergency and should be followed up as soon as possible.  Feel free to call the clinic should you have any questions or concerns. The clinic phone number is [phone].  Please show the [place] at check-in to the Emergency Department and triage nurse.

## 2019-08-07 NOTE — Progress Notes (Signed)
  [place] OFFICE PROGRESS NOTE   Diagnosis: Hepatocellular carcinoma  INTERVAL HISTORY:   Mr. Sanger returns as scheduled.  He completed cycle 6 atezolizumab/bevacizumab 07/17/2019.  He denies nausea/vomiting.  No mouth sores.  No diarrhea.  The rash which predated his treatment is improving with the cream he is applying.  No new rash.  He reports a good appetite.  No abdominal pain.  He denies bleeding.  No fever, cough, shortness of breath.  He tends to have back "stiffness" in the morning hours.  This improves with activity during the day.  Objective:  Vital signs in last 24 hours:  Blood pressure (!) 152/74, pulse 97, temperature 97.7 F (36.5 C), temperature source Temporal, resp. rate 19, height 5\' 4"  (1.626 m), weight 195 lb 11.2 oz (88.8 kg), SpO2 99 %.    HEENT: No thrush or ulcers. Resp: Lungs clear bilaterally. Cardio: Regular rate and rhythm. GI: Abdomen soft and nontender.  No hepatomegaly.  No mass. Vascular: No leg edema. Neuro: Alert and oriented. Skin: Hyperpigmented lesions scattered over the abdomen.  Lesions appear to be resolving.   Lab Results:  Lab Results  Component Value Date   WBC 7.3 08/07/2019   HGB 15.0 08/07/2019   HCT 44.8 08/07/2019   MCV 81.9 08/07/2019   PLT 297 08/07/2019   NEUTROABS 4.0 08/07/2019    Imaging:  No results found.  Medications: I have reviewed the patient's current medications.  Assessment/Plan: 1. Hepatocellular carcinoma  02/12/2019 AFP 10,200  02/18/2019 CT abdomen-dominant poorly marginated heterogeneously enhancing 8.6 x 7.7 cm posterior right liver lobe mass, new compared to CT study 09/10/2016. Numerous similar-appearing liver masses scattered throughout the liver all new since 09/10/2016 CT. Diffusely irregular liver surface compatible with cirrhosis. Spleen normal size. No ascites. No abdominal adenopathy.  MRI liver 03/05/2019-multifocal hepatic metastases including a 10 cm lesion in the  right liver,Li-rads 5, no evidence of metastatic disease  Cycle 1 atezolizumab/bevacizumab 03/20/2019  Cycle 2 atezolizumab/bevacizumab 04/24/2019  Cycle 3 atezolizumab/bevacizumab 05/15/2019  Cycle 4 atezolizumab/bevacizumab 06/05/2019  MRI liver 06/24/2019-multifocal hepatocellular carcinoma, lesions stable to decreased in size.  No evidence for disease progression.  Cirrhosis.  Cycle 5 atezolizumab/bevacizumab 06/25/2019  Cycle 6 atezolizumab/bevacizumab 07/17/2019  Cycle 7 atezolizumab/bevacizumab 08/07/2019 2. 01/20/2019 chest CT-no definite evidence of pulmonary embolus. Wall thickening of the distal esophagus. Nodular hepatic contours. 3. Cirrhosis  4. Hepatitis C status post Harvoni 5. Type 2 diabetes 6. COPD 7. CAD 8. Chronic diastolic heart failure 9. Hypertension 10. Positive Cologuard 2019  Disposition: Mr. Osment appears stable.  He has completed 6 cycles of atezolizumab/bevacizumab.  There is no clinical evidence of disease progression.  He continues to tolerate treatment well.  Plan to proceed with cycle 7 today as scheduled.  We will follow-up on the AFP from today.  We reviewed the CBC from today.  Counts adequate to proceed with treatment.  He will return for lab, follow-up, cycle 8 atezolizumab/bevacizumab in 3 weeks.    Ned Card ANP/GNP-BC   08/07/2019  2:50 PM

## 2019-08-11 ENCOUNTER — Other Ambulatory Visit: Payer: Self-pay | Admitting: *Deleted

## 2019-08-11 DIAGNOSIS — C220 Liver cell carcinoma: Secondary | ICD-10-CM

## 2019-08-11 LAB — AFP TUMOR MARKER: AFP, Serum, Tumor Marker: 18106 ng/mL — ABNORMAL HIGH (ref 0.0–8.3)

## 2019-08-20 ENCOUNTER — Other Ambulatory Visit: Payer: Self-pay | Admitting: Internal Medicine

## 2019-08-22 ENCOUNTER — Other Ambulatory Visit: Payer: Self-pay | Admitting: Oncology

## 2019-08-24 ENCOUNTER — Other Ambulatory Visit: Payer: Self-pay
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
## 2019-08-26 ENCOUNTER — Ambulatory Visit: Payer: Medicare HMO | Admitting: Endocrinology

## 2019-08-26 ENCOUNTER — Encounter: Payer: Self-pay | Admitting: Endocrinology

## 2019-08-26 ENCOUNTER — Other Ambulatory Visit: Payer: Self-pay

## 2019-08-26 VITALS — BP 126/80 | HR 105 | Ht 64.0 in | Wt 196.8 lb

## 2019-08-26 DIAGNOSIS — Z794 Long term (current) use of insulin: Secondary | ICD-10-CM

## 2019-08-26 DIAGNOSIS — E119 Type 2 diabetes mellitus without complications: Secondary | ICD-10-CM

## 2019-08-26 LAB — POCT GLYCOSYLATED HEMOGLOBIN (HGB A1C): Hemoglobin A1C: 8 % — AB (ref 4.0–5.6)

## 2019-08-26 MED ORDER — NOVOLIN 70/30 (70-30) 100 UNIT/ML ~~LOC~~ SUSP
160.0000 [IU] | Freq: Every day | SUBCUTANEOUS | 11 refills | Status: DC
Start: 2019-08-26 — End: 2020-01-26
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
## 2019-08-26 NOTE — Patient Instructions (Addendum)
check your blood sugar 4 times a day: before the 3 meals, and at bedtime.  also check if you have symptoms of your blood sugar being too high or too low.  please keep a record of the readings and bring it to your next appointment here (or you can bring the meter itself).  You can write it on any piece of paper.  please call us sooner if your blood sugar goes below 70, or if you have a lot of readings over 200.  Please increase the insulin to 160 units each morning.  However, if you are going to be active, take just 110 units. On this type of insulin schedule, you should eat meals on a regular schedule (especiallly lunch).  If a meal is missed or significantly delayed, your blood sugar could go low.   Please come back for a follow-up appointment in 2 months.

## 2019-08-28 ENCOUNTER — Inpatient Hospital Stay: Payer: Medicare HMO

## 2019-08-28 ENCOUNTER — Encounter: Payer: Self-pay | Admitting: Nutrition

## 2019-08-28 ENCOUNTER — Inpatient Hospital Stay: Payer: Medicare HMO | Admitting: Nutrition

## 2019-08-28 ENCOUNTER — Inpatient Hospital Stay: Payer: Medicare HMO | Admitting: Oncology

## 2019-08-28 ENCOUNTER — Other Ambulatory Visit: Payer: Self-pay

## 2019-08-28 VITALS — BP 144/68 | HR 89 | Temp 97.9°F | Resp 20 | Ht 64.0 in | Wt 198.9 lb

## 2019-08-28 DIAGNOSIS — I110 Hypertensive heart disease with heart failure: Secondary | ICD-10-CM | POA: Diagnosis not present

## 2019-08-28 DIAGNOSIS — I5032 Chronic diastolic (congestive) heart failure: Secondary | ICD-10-CM | POA: Diagnosis not present

## 2019-08-28 DIAGNOSIS — I2510 Atherosclerotic heart disease of native coronary artery without angina pectoris: Secondary | ICD-10-CM | POA: Diagnosis not present

## 2019-08-28 DIAGNOSIS — E119 Type 2 diabetes mellitus without complications: Secondary | ICD-10-CM | POA: Diagnosis not present

## 2019-08-28 DIAGNOSIS — C220 Liver cell carcinoma: Secondary | ICD-10-CM

## 2019-08-28 DIAGNOSIS — J449 Chronic obstructive pulmonary disease, unspecified: Secondary | ICD-10-CM | POA: Diagnosis not present

## 2019-08-28 DIAGNOSIS — Z5112 Encounter for antineoplastic immunotherapy: Secondary | ICD-10-CM | POA: Diagnosis not present

## 2019-08-28 DIAGNOSIS — K7460 Unspecified cirrhosis of liver: Secondary | ICD-10-CM | POA: Diagnosis not present

## 2019-08-28 DIAGNOSIS — R21 Rash and other nonspecific skin eruption: Secondary | ICD-10-CM | POA: Diagnosis not present

## 2019-08-28 LAB — CBC WITH DIFFERENTIAL (CANCER CENTER ONLY)
Abs Immature Granulocytes: 0.02 10*3/uL (ref 0.00–0.07)
Basophils Absolute: 0.1 10*3/uL (ref 0.0–0.1)
Basophils Relative: 1 %
Eosinophils Absolute: 0.4 10*3/uL (ref 0.0–0.5)
Eosinophils Relative: 6 %
HCT: 44.7 % (ref 39.0–52.0)
Hemoglobin: 14.8 g/dL (ref 13.0–17.0)
Immature Granulocytes: 0 %
Lymphocytes Relative: 38 %
Lymphs Abs: 2.5 10*3/uL (ref 0.7–4.0)
MCH: 27.3 pg (ref 26.0–34.0)
MCHC: 33.1 g/dL (ref 30.0–36.0)
MCV: 82.5 fL (ref 80.0–100.0)
Monocytes Absolute: 0.5 10*3/uL (ref 0.1–1.0)
Monocytes Relative: 8 %
Neutro Abs: 3 10*3/uL (ref 1.7–7.7)
Neutrophils Relative %: 47 %
Platelet Count: 310 10*3/uL (ref 150–400)
RBC: 5.42 MIL/uL (ref 4.22–5.81)
RDW: 15 % (ref 11.5–15.5)
WBC Count: 6.4 10*3/uL (ref 4.0–10.5)
nRBC: 0 % (ref 0.0–0.2)

## 2019-08-28 LAB — CMP (CANCER CENTER ONLY)
ALT: 63 U/L — ABNORMAL HIGH (ref 0–44)
AST: 62 U/L — ABNORMAL HIGH (ref 15–41)
Albumin: 3.7 g/dL (ref 3.5–5.0)
Alkaline Phosphatase: 104 U/L (ref 38–126)
Anion gap: 13 (ref 5–15)
BUN: 18 mg/dL (ref 8–23)
CO2: 20 mmol/L — ABNORMAL LOW (ref 22–32)
Calcium: 9 mg/dL (ref 8.9–10.3)
Chloride: 104 mmol/L (ref 98–111)
Creatinine: 1.26 mg/dL — ABNORMAL HIGH (ref 0.61–1.24)
GFR, Est AFR Am: >60 mL/min (ref >60)
GFR, Estimated: 59 mL/min — ABNORMAL LOW (ref >60)
Glucose, Bld: 232 mg/dL — ABNORMAL HIGH (ref 70–99)
Potassium: 4.2 mmol/L (ref 3.5–5.1)
Sodium: 137 mmol/L (ref 135–145)
Total Bilirubin: 0.6 mg/dL (ref 0.3–1.2)
Total Protein: 7.5 g/dL (ref 6.5–8.1)

## 2019-08-28 MED ORDER — SODIUM CHLORIDE 0.9 % IV SOLN
1200.0000 mg | Freq: Once | INTRAVENOUS | Status: AC
  Administered 2019-08-28: 1200 mg via INTRAVENOUS
  Filled 2019-08-28: qty 20

## 2019-08-28 MED ORDER — SODIUM CHLORIDE 0.9 % IV SOLN
15.0000 mg/kg | Freq: Once | INTRAVENOUS | Status: AC
  Administered 2019-08-28: 1300 mg via INTRAVENOUS
  Filled 2019-08-28: qty 48

## 2019-08-28 MED ORDER — SODIUM CHLORIDE 0.9 % IV SOLN
Freq: Once | INTRAVENOUS | Status: AC
  Filled 2019-08-28: qty 250

## 2019-08-28 NOTE — Progress Notes (Signed)
Patient canceled nutrition appointment and did not reschedule.

## 2019-08-28 NOTE — Patient Instructions (Signed)
[place] Discharge Instructions for Patients Receiving Chemotherapy  Today you received the following chemotherapy agents Tecentriq and Bevacizumab  To help prevent nausea and vomiting after your treatment, we encourage you to take your nausea medication as directed.    If you develop nausea and vomiting that is not controlled by your nausea medication, call the clinic.   BELOW ARE SYMPTOMS THAT SHOULD BE REPORTED IMMEDIATELY:  *FEVER GREATER THAN 100.5 F  *CHILLS WITH OR WITHOUT FEVER  NAUSEA AND VOMITING THAT IS NOT CONTROLLED WITH YOUR NAUSEA MEDICATION  *UNUSUAL SHORTNESS OF BREATH  *UNUSUAL BRUISING OR BLEEDING  TENDERNESS IN MOUTH AND THROAT WITH OR WITHOUT PRESENCE OF ULCERS  *URINARY PROBLEMS  *BOWEL PROBLEMS  UNUSUAL RASH Items with * indicate a potential emergency and should be followed up as soon as possible.  Feel free to call the clinic should you have any questions or concerns. The clinic phone number is [phone].  Please show the [place] at check-in to the Emergency Department and triage nurse.
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
## 2019-08-29 LAB — AFP TUMOR MARKER: AFP, Serum, Tumor Marker: 15778 ng/mL — ABNORMAL HIGH (ref 0.0–8.3)

## 2019-09-08 ENCOUNTER — Ambulatory Visit: Payer: Medicare HMO

## 2019-09-09 ENCOUNTER — Telehealth: Payer: Self-pay

## 2019-09-09 NOTE — Telephone Encounter (Signed)
Pt has been informed.

## 2019-09-09 NOTE — Telephone Encounter (Signed)
I'm not sure what the question is...

## 2019-09-09 NOTE — Telephone Encounter (Signed)
On #4 he is asking if he should be taking both medications.

## 2019-09-09 NOTE — Telephone Encounter (Signed)
Hydroxyzine is only as needed for itching. The other is his blood pressure medication and should be taken as prescribed.

## 2019-09-09 NOTE — Telephone Encounter (Signed)
New message    Pt c/o medication issue:  1. Name of Medication: lisinopril-hydrochlorothiazide (ZESTORETIC) 20-25 MG tablet & hydrOXYzine (ATARAX/VISTARIL) 25 MG tablet  2. How are you currently taking this medication (dosage and times per day)? daily  3. Are you having a reaction (difficulty breathing--STAT)? No   4. What is your medication issue? Does he suppose to take both medication.

## 2019-09-13 ENCOUNTER — Other Ambulatory Visit: Payer: Self-pay | Admitting: Oncology

## 2019-09-18 ENCOUNTER — Inpatient Hospital Stay: Payer: Medicare HMO

## 2019-09-18 ENCOUNTER — Encounter: Payer: Self-pay | Admitting: Nurse Practitioner

## 2019-09-18 ENCOUNTER — Inpatient Hospital Stay (HOSPITAL_BASED_OUTPATIENT_CLINIC_OR_DEPARTMENT_OTHER): Payer: Medicare HMO | Admitting: Nurse Practitioner

## 2019-09-18 ENCOUNTER — Other Ambulatory Visit: Payer: Self-pay

## 2019-09-18 ENCOUNTER — Inpatient Hospital Stay: Payer: Medicare HMO | Attending: Nurse Practitioner

## 2019-09-18 VITALS — BP 123/65 | HR 96 | Temp 97.9°F | Resp 17 | Ht 64.0 in | Wt 198.4 lb

## 2019-09-18 DIAGNOSIS — I110 Hypertensive heart disease with heart failure: Secondary | ICD-10-CM | POA: Diagnosis not present

## 2019-09-18 DIAGNOSIS — K7460 Unspecified cirrhosis of liver: Secondary | ICD-10-CM | POA: Insufficient documentation

## 2019-09-18 DIAGNOSIS — I2510 Atherosclerotic heart disease of native coronary artery without angina pectoris: Secondary | ICD-10-CM | POA: Diagnosis not present

## 2019-09-18 DIAGNOSIS — C220 Liver cell carcinoma: Secondary | ICD-10-CM

## 2019-09-18 DIAGNOSIS — Z5112 Encounter for antineoplastic immunotherapy: Secondary | ICD-10-CM | POA: Insufficient documentation

## 2019-09-18 DIAGNOSIS — E119 Type 2 diabetes mellitus without complications: Secondary | ICD-10-CM | POA: Diagnosis not present

## 2019-09-18 DIAGNOSIS — I5032 Chronic diastolic (congestive) heart failure: Secondary | ICD-10-CM | POA: Diagnosis not present

## 2019-09-18 DIAGNOSIS — J449 Chronic obstructive pulmonary disease, unspecified: Secondary | ICD-10-CM | POA: Diagnosis not present

## 2019-09-18 DIAGNOSIS — M549 Dorsalgia, unspecified: Secondary | ICD-10-CM | POA: Diagnosis not present

## 2019-09-18 DIAGNOSIS — G8929 Other chronic pain: Secondary | ICD-10-CM | POA: Diagnosis not present

## 2019-09-18 LAB — CMP (CANCER CENTER ONLY)
ALT: 84 U/L — ABNORMAL HIGH (ref 0–44)
AST: 80 U/L — ABNORMAL HIGH (ref 15–41)
Albumin: 3.7 g/dL (ref 3.5–5.0)
Alkaline Phosphatase: 123 U/L (ref 38–126)
Anion gap: 12 (ref 5–15)
BUN: 15 mg/dL (ref 8–23)
CO2: 19 mmol/L — ABNORMAL LOW (ref 22–32)
Calcium: 9 mg/dL (ref 8.9–10.3)
Chloride: 101 mmol/L (ref 98–111)
Creatinine: 1.43 mg/dL — ABNORMAL HIGH (ref 0.61–1.24)
GFR, Est AFR Am: 58 mL/min — ABNORMAL LOW (ref >60)
GFR, Estimated: 50 mL/min — ABNORMAL LOW (ref >60)
Glucose, Bld: 306 mg/dL — ABNORMAL HIGH (ref 70–99)
Potassium: 4.3 mmol/L (ref 3.5–5.1)
Sodium: 132 mmol/L — ABNORMAL LOW (ref 135–145)
Total Bilirubin: 0.8 mg/dL (ref 0.3–1.2)
Total Protein: 7.7 g/dL (ref 6.5–8.1)

## 2019-09-18 LAB — CBC WITH DIFFERENTIAL (CANCER CENTER ONLY)
Abs Immature Granulocytes: 0.01 10*3/uL (ref 0.00–0.07)
Basophils Absolute: 0.1 10*3/uL (ref 0.0–0.1)
Basophils Relative: 1 %
Eosinophils Absolute: 0.3 10*3/uL (ref 0.0–0.5)
Eosinophils Relative: 5 %
HCT: 46.5 % (ref 39.0–52.0)
Hemoglobin: 15.3 g/dL (ref 13.0–17.0)
Immature Granulocytes: 0 %
Lymphocytes Relative: 34 %
Lymphs Abs: 2.1 10*3/uL (ref 0.7–4.0)
MCH: 27.4 pg (ref 26.0–34.0)
MCHC: 32.9 g/dL (ref 30.0–36.0)
MCV: 83.3 fL (ref 80.0–100.0)
Monocytes Absolute: 0.5 10*3/uL (ref 0.1–1.0)
Monocytes Relative: 9 %
Neutro Abs: 3.2 10*3/uL (ref 1.7–7.7)
Neutrophils Relative %: 51 %
Platelet Count: 323 10*3/uL (ref 150–400)
RBC: 5.58 MIL/uL (ref 4.22–5.81)
RDW: 15.5 % (ref 11.5–15.5)
WBC Count: 6.3 10*3/uL (ref 4.0–10.5)
nRBC: 0 % (ref 0.0–0.2)

## 2019-09-18 LAB — TSH: TSH: 1.68 u[IU]/mL (ref 0.320–4.118)

## 2019-09-18 LAB — TOTAL PROTEIN, URINE DIPSTICK: Protein, ur: NEGATIVE mg/dL

## 2019-09-18 MED ORDER — SODIUM CHLORIDE 0.9 % IV SOLN
1200.0000 mg | Freq: Once | INTRAVENOUS | Status: AC
  Administered 2019-09-18: 1200 mg via INTRAVENOUS
  Filled 2019-09-18: qty 20

## 2019-09-18 MED ORDER — SODIUM CHLORIDE 0.9 % IV SOLN
Freq: Once | INTRAVENOUS | Status: AC
  Filled 2019-09-18: qty 250

## 2019-09-18 MED ORDER — SODIUM CHLORIDE 0.9 % IV SOLN
15.0000 mg/kg | Freq: Once | INTRAVENOUS | Status: AC
  Administered 2019-09-18: 1300 mg via INTRAVENOUS
  Filled 2019-09-18: qty 48

## 2019-09-18 NOTE — Progress Notes (Signed)
  [name] OFFICE PROGRESS NOTE   Diagnosis: Hepatocellular carcinoma  INTERVAL HISTORY:   [name] returns as scheduled.  He completed another cycle of atezolizumab/bevacizumab 08/28/2019.  He denies nausea/vomiting.  No mouth sores.  No diarrhea.  He notes a change in bowel habits 2 to 3 weeks ago.  He is having more frequent bowel movements, estimates 4-5 small-volume stools a day, not watery.  No rash.  No fever.  No bleeding.  He denies shortness of breath.  Stable chronic back pain.  Objective:  Vital signs in last 24 hours:  Blood pressure 123/65, pulse 96, temperature 97.9 F (36.6 C), temperature source Temporal, resp. rate 17, height 5\' 4"  (1.626 m), weight 198 lb 6.4 oz (90 kg), SpO2 100 %.    Resp: Lungs clear bilaterally. Cardio: Regular rate and rhythm. GI: Abdomen soft and nontender.  No hepatomegaly.  No apparent ascites. Vascular: No leg edema. Skin: Hyperpigmented lesions over the trunk are lighter.   Lab Results:  Lab Results  Component Value Date   WBC 6.3 09/18/2019   HGB 15.3 09/18/2019   HCT 46.5 09/18/2019   MCV 83.3 09/18/2019   PLT 323 09/18/2019   NEUTROABS 3.2 09/18/2019    Imaging:  No results found.  Medications: I have reviewed the patient's current medications.  Assessment/Plan: 1. Hepatocellular carcinoma  02/12/2019 AFP 10,200  02/18/2019 CT abdomen-dominant poorly marginated heterogeneously enhancing 8.6 x 7.7 cm posterior right liver lobe mass, new compared to CT study 09/10/2016. Numerous similar-appearing liver masses scattered throughout the liver all new since 09/10/2016 CT. Diffusely irregular liver surface compatible with cirrhosis. Spleen normal size. No ascites. No abdominal adenopathy.  MRI liver 03/05/2019-multifocal hepatic metastases including a 10 cm lesion in the right liver,Li-rads 5, no evidence of metastatic disease  Cycle 1 atezolizumab/bevacizumab 03/20/2019  Cycle 2 atezolizumab/bevacizumab  04/24/2019  Cycle 3 atezolizumab/bevacizumab 05/15/2019  Cycle 4 atezolizumab/bevacizumab 06/05/2019  MRI liver 06/24/2019-multifocal hepatocellular carcinoma, lesions stable to decreased in size. No evidence for disease progression. Cirrhosis.  Cycle 5 atezolizumab/bevacizumab 06/25/2019  Cycle 6 atezolizumab/bevacizumab 07/17/2019  Cycle 7 atezolizumab/bevacizumab 08/07/2019  Cycle 8 atezolizumab/bevacizumab 08/28/2019  Cycle 9 atezolizumab/bevacizumab 09/18/2019 2. 01/20/2019 chest CT-no definite evidence of pulmonary embolus. Wall thickening of the distal esophagus. Nodular hepatic contours. 3. Cirrhosis  4. Hepatitis C status post Harvoni 5. Type 2 diabetes 6. COPD 7. CAD 8. Chronic diastolic heart failure 9. Hypertension 10. Positive Cologuard 2019  Disposition: [name] appears stable.  He has completed 8 cycles of atezolizumab/bevacizumab.  There is no clinical evidence of disease progression.  He is tolerating treatment well.  Plan to proceed with cycle 9 today as scheduled.  Restaging liver MRI in approximately 2 months.   We discussed the change in bowel habits, more frequent formed stools.  He will contact the office if he develops diarrhea.  He will return for lab, follow-up, cycle 10 atezolizumab/bevacizumab in 3 weeks.  He will contact the office in the interim as outlined above or with any other problems.  Plan reviewed with Dr. Benay Spice.    Ned Card ANP/GNP-BC   09/18/2019  12:32 PM

## 2019-09-18 NOTE — Progress Notes (Signed)
Per Ned Card, NP ok to treat with AST 80, ALT 84.

## 2019-09-18 NOTE — Patient Instructions (Signed)
[facility] Discharge Instructions for Patients Receiving Chemotherapy  Today you received the following chemotherapy agents Tecentriq and Bevacizumab  To help prevent nausea and vomiting after your treatment, we encourage you to take your nausea medication as directed.    If you develop nausea and vomiting that is not controlled by your nausea medication, call the clinic.   BELOW ARE SYMPTOMS THAT SHOULD BE REPORTED IMMEDIATELY:  *FEVER GREATER THAN 100.5 F  *CHILLS WITH OR WITHOUT FEVER  NAUSEA AND VOMITING THAT IS NOT CONTROLLED WITH YOUR NAUSEA MEDICATION  *UNUSUAL SHORTNESS OF BREATH  *UNUSUAL BRUISING OR BLEEDING  TENDERNESS IN MOUTH AND THROAT WITH OR WITHOUT PRESENCE OF ULCERS  *URINARY PROBLEMS  *BOWEL PROBLEMS  UNUSUAL RASH Items with * indicate a potential emergency and should be followed up as soon as possible.  Feel free to call the clinic should you have any questions or concerns. The clinic phone number is [phone].  Please show the [place] at check-in to the Emergency Department and triage nurse.

## 2019-09-19 LAB — AFP TUMOR MARKER: AFP, Serum, Tumor Marker: 19015 ng/mL — ABNORMAL HIGH (ref 0.0–8.3)

## 2019-09-21 ENCOUNTER — Telehealth: Payer: Self-pay | Admitting: Nurse Practitioner

## 2019-09-21 NOTE — Telephone Encounter (Signed)
Scheduled per 7/16 los. Called and spoke with pt, confirmed 8/6 appts

## 2019-09-23 ENCOUNTER — Other Ambulatory Visit: Payer: Self-pay | Admitting: Internal Medicine

## 2019-09-23 ENCOUNTER — Telehealth: Payer: Self-pay | Admitting: Internal Medicine

## 2019-09-23 NOTE — Telephone Encounter (Signed)
New Message:   Pt is calling and states he had labs at the [facility] last Friday and he said that he was told that his liver enzymes are up from his pain medication. He states he would like to know what else can he take for pain that will not affect his enzymes. Please advise.

## 2019-09-23 NOTE — Telephone Encounter (Signed)
He states that he has been taking OTC ibuprofen and aleve.
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
## 2019-10-04 ENCOUNTER — Other Ambulatory Visit: Payer: Self-pay | Admitting: Oncology

## 2019-10-07 ENCOUNTER — Other Ambulatory Visit: Payer: Self-pay | Admitting: Internal Medicine

## 2019-10-09 ENCOUNTER — Inpatient Hospital Stay: Payer: Medicare HMO

## 2019-10-09 ENCOUNTER — Inpatient Hospital Stay: Payer: Medicare HMO | Admitting: Nurse Practitioner

## 2019-10-09 ENCOUNTER — Inpatient Hospital Stay: Payer: Medicare HMO | Attending: Nurse Practitioner

## 2019-10-09 ENCOUNTER — Other Ambulatory Visit: Payer: Self-pay

## 2019-10-09 ENCOUNTER — Encounter: Payer: Self-pay | Admitting: Nurse Practitioner

## 2019-10-09 VITALS — BP 154/94 | HR 97 | Temp 97.3°F | Resp 19 | Ht 64.0 in | Wt 200.2 lb

## 2019-10-09 VITALS — BP 156/72 | HR 83 | Temp 98.6°F | Resp 18

## 2019-10-09 DIAGNOSIS — I110 Hypertensive heart disease with heart failure: Secondary | ICD-10-CM | POA: Diagnosis not present

## 2019-10-09 DIAGNOSIS — K7460 Unspecified cirrhosis of liver: Secondary | ICD-10-CM | POA: Insufficient documentation

## 2019-10-09 DIAGNOSIS — C220 Liver cell carcinoma: Secondary | ICD-10-CM | POA: Diagnosis not present

## 2019-10-09 DIAGNOSIS — I5032 Chronic diastolic (congestive) heart failure: Secondary | ICD-10-CM | POA: Diagnosis not present

## 2019-10-09 DIAGNOSIS — J449 Chronic obstructive pulmonary disease, unspecified: Secondary | ICD-10-CM | POA: Insufficient documentation

## 2019-10-09 DIAGNOSIS — E119 Type 2 diabetes mellitus without complications: Secondary | ICD-10-CM | POA: Diagnosis not present

## 2019-10-09 DIAGNOSIS — Z5112 Encounter for antineoplastic immunotherapy: Secondary | ICD-10-CM | POA: Diagnosis not present

## 2019-10-09 DIAGNOSIS — I2510 Atherosclerotic heart disease of native coronary artery without angina pectoris: Secondary | ICD-10-CM | POA: Diagnosis not present

## 2019-10-09 LAB — CBC WITH DIFFERENTIAL (CANCER CENTER ONLY)
Abs Immature Granulocytes: 0.02 10*3/uL (ref 0.00–0.07)
Basophils Absolute: 0.1 10*3/uL (ref 0.0–0.1)
Basophils Relative: 1 %
Eosinophils Absolute: 0.4 10*3/uL (ref 0.0–0.5)
Eosinophils Relative: 5 %
HCT: 47.2 % (ref 39.0–52.0)
Hemoglobin: 15.6 g/dL (ref 13.0–17.0)
Immature Granulocytes: 0 %
Lymphocytes Relative: 37 %
Lymphs Abs: 2.9 10*3/uL (ref 0.7–4.0)
MCH: 27.1 pg (ref 26.0–34.0)
MCHC: 33.1 g/dL (ref 30.0–36.0)
MCV: 81.9 fL (ref 80.0–100.0)
Monocytes Absolute: 0.7 10*3/uL (ref 0.1–1.0)
Monocytes Relative: 9 %
Neutro Abs: 3.7 10*3/uL (ref 1.7–7.7)
Neutrophils Relative %: 48 %
Platelet Count: 374 10*3/uL (ref 150–400)
RBC: 5.76 MIL/uL (ref 4.22–5.81)
RDW: 15.4 % (ref 11.5–15.5)
WBC Count: 7.8 10*3/uL (ref 4.0–10.5)
nRBC: 0 % (ref 0.0–0.2)

## 2019-10-09 LAB — CMP (CANCER CENTER ONLY)
ALT: 79 U/L — ABNORMAL HIGH (ref 0–44)
AST: 69 U/L — ABNORMAL HIGH (ref 15–41)
Albumin: 3.7 g/dL (ref 3.5–5.0)
Alkaline Phosphatase: 134 U/L — ABNORMAL HIGH (ref 38–126)
Anion gap: 10 (ref 5–15)
BUN: 17 mg/dL (ref 8–23)
CO2: 21 mmol/L — ABNORMAL LOW (ref 22–32)
Calcium: 9.9 mg/dL (ref 8.9–10.3)
Chloride: 105 mmol/L (ref 98–111)
Creatinine: 1.28 mg/dL — ABNORMAL HIGH (ref 0.61–1.24)
GFR, Est AFR Am: >60 mL/min (ref >60)
GFR, Estimated: 57 mL/min — ABNORMAL LOW (ref >60)
Glucose, Bld: 147 mg/dL — ABNORMAL HIGH (ref 70–99)
Potassium: 3.8 mmol/L (ref 3.5–5.1)
Sodium: 136 mmol/L (ref 135–145)
Total Bilirubin: 0.8 mg/dL (ref 0.3–1.2)
Total Protein: 8 g/dL (ref 6.5–8.1)

## 2019-10-09 LAB — TOTAL PROTEIN, URINE DIPSTICK: Protein, ur: NEGATIVE mg/dL

## 2019-10-09 MED ORDER — SODIUM CHLORIDE 0.9 % IV SOLN
15.0000 mg/kg | Freq: Once | INTRAVENOUS | Status: AC
  Administered 2019-10-09: 1300 mg via INTRAVENOUS
  Filled 2019-10-09: qty 48

## 2019-10-09 MED ORDER — SODIUM CHLORIDE 0.9 % IV SOLN
1200.0000 mg | Freq: Once | INTRAVENOUS | Status: AC
  Administered 2019-10-09: 1200 mg via INTRAVENOUS
  Filled 2019-10-09: qty 20

## 2019-10-09 MED ORDER — SODIUM CHLORIDE 0.9 % IV SOLN
Freq: Once | INTRAVENOUS | Status: AC
  Filled 2019-10-09: qty 250

## 2019-10-09 NOTE — Progress Notes (Addendum)
[place] OFFICE PROGRESS NOTE   Diagnosis: Hepatocellular carcinoma  INTERVAL HISTORY:   [name] returns as scheduled.  He completed another cycle of atezolizumab/bevacizumab 09/18/2019.  He denies nausea/vomiting.  No diarrhea.  He is no longer experiencing frequent bowel movements.  Bowels are moving on a normal regular basis.  No fever.  About 3 weeks ago he started mowing lawns.  Since then he has noted more fatigue and right shoulder/upper chest pain with certain movements.  He notes mild dyspnea when he mows.  He feels this is related to COPD.  He has no shortness of breath at rest or usual activities.  He had no shortness of breath walking into the cancer center today.  No leg swelling or calf pain.  He has an inhaler he uses once daily and also an albuterol inhaler for as needed use.  Objective:  Vital signs in last 24 hours:  Blood pressure (!) 154/94, pulse 97, temperature (!) 97.3 F (36.3 C), temperature source Temporal, resp. rate 19, height 5\' 4"  (1.626 m), weight 200 lb 3.2 oz (90.8 kg), SpO2 100 %.    Resp: Lungs are clear, breath sounds distant.  No respiratory distress. Cardio: Regular rate and rhythm. GI: Abdomen soft and nontender.  No hepatomegaly. Vascular: No leg edema. Neuro: Alert and oriented.    Lab Results:  Lab Results  Component Value Date   WBC 7.8 10/09/2019   HGB 15.6 10/09/2019   HCT 47.2 10/09/2019   MCV 81.9 10/09/2019   PLT 374 10/09/2019   NEUTROABS 3.7 10/09/2019    Imaging:  No results found.  Medications: I have reviewed the patient's current medications.  Assessment/Plan: 1. Hepatocellular carcinoma  02/12/2019 AFP 10,200  02/18/2019 CT abdomen-dominant poorly marginated heterogeneously enhancing 8.6 x 7.7 cm posterior right liver lobe mass, new compared to CT study 09/10/2016. Numerous similar-appearing liver masses scattered throughout the liver all new since 09/10/2016 CT. Diffusely irregular liver surface  compatible with cirrhosis. Spleen normal size. No ascites. No abdominal adenopathy.  MRI liver 03/05/2019-multifocal hepatic metastases including a 10 cm lesion in the right liver,Li-rads 5, no evidence of metastatic disease  Cycle 1 atezolizumab/bevacizumab 03/20/2019  Cycle 2 atezolizumab/bevacizumab 04/24/2019  Cycle 3 atezolizumab/bevacizumab 05/15/2019  Cycle 4 atezolizumab/bevacizumab 06/05/2019  MRI liver 06/24/2019-multifocal hepatocellular carcinoma, lesions stable to decreased in size. No evidence for disease progression. Cirrhosis.  Cycle 5 atezolizumab/bevacizumab 06/25/2019  Cycle 6 atezolizumab/bevacizumab 07/17/2019  Cycle 7 atezolizumab/bevacizumab 08/07/2019  Cycle 8 atezolizumab/bevacizumab 08/28/2019  Cycle 9 atezolizumab/bevacizumab 09/18/2019  Cycle 10 atezolizumab/bevacizumab 10/09/2019 2. 01/20/2019 chest CT-no definite evidence of pulmonary embolus. Wall thickening of the distal esophagus. Nodular hepatic contours. 3. Cirrhosis  4. Hepatitis C status post Harvoni 5. Type 2 diabetes 6. COPD 7. CAD 8. Chronic diastolic heart failure 9. Hypertension 10. Positive Cologuard 2019  Disposition: [name] appears stable.  He has completed 9 cycles of atezolizumab/bevacizumab.  Plan to proceed with cycle 10 today as scheduled.  Restaging MRI of the liver prior to his next visit.  We reviewed the CBC and chemistry panel from today.  Labs adequate to proceed with treatment.  We discussed the stable elevation of AST/ALT.  Bilirubin is normal.  Creatinine mildly elevated, stable.  The right shoulder/upper chest pain only occurs with certain movements and began after he started mowing lawns.  Likely benign musculoskeletal pain.  He will let us know if this worsens.  The dyspnea on exertion may be related to COPD.  He will continue inhaler use.  I cautioned him against overexertion, being outside for extended periods of time, and the need for adequate hydration.    He will  return for lab, follow-up, treatment in 3 weeks.  He will contact the office in the interim with any problems.  We specifically discussed worsening pain and/or dyspnea.    Ned Card ANP/GNP-BC   10/09/2019  3:03 PM

## 2019-10-09 NOTE — Patient Instructions (Signed)
[facility] Discharge Instructions for Patients Receiving Chemotherapy  Today you received the following chemotherapy agents: Atezolizumab (Tecentriq) and Bevacizumab (ZIRABEV).  To help prevent nausea and vomiting after your treatment, we encourage you to take your nausea medication as directed by your MD.   If you develop nausea and vomiting that is not controlled by your nausea medication, call the clinic.   BELOW ARE SYMPTOMS THAT SHOULD BE REPORTED IMMEDIATELY:  *FEVER GREATER THAN 100.5 F  *CHILLS WITH OR WITHOUT FEVER  NAUSEA AND VOMITING THAT IS NOT CONTROLLED WITH YOUR NAUSEA MEDICATION  *UNUSUAL SHORTNESS OF BREATH  *UNUSUAL BRUISING OR BLEEDING  TENDERNESS IN MOUTH AND THROAT WITH OR WITHOUT PRESENCE OF ULCERS  *URINARY PROBLEMS  *BOWEL PROBLEMS  UNUSUAL RASH Items with * indicate a potential emergency and should be followed up as soon as possible.  Feel free to call the clinic should you have any questions or concerns. The clinic phone number is [phone].  Please show the [place] at check-in to the Emergency Department and triage nurse.

## 2019-10-09 NOTE — Addendum Note (Signed)
Addended by: [name] on: 10/09/2019 03:40 PM   Modules accepted: Level of Service

## 2019-10-10 LAB — AFP TUMOR MARKER: AFP, Serum, Tumor Marker: 20436 ng/mL — ABNORMAL HIGH (ref 0.0–8.3)

## 2019-10-12 ENCOUNTER — Telehealth: Payer: Self-pay | Admitting: Oncology

## 2019-10-12 ENCOUNTER — Telehealth: Payer: Self-pay | Admitting: Nurse Practitioner

## 2019-10-12 NOTE — Telephone Encounter (Signed)
Rescheduled appointments per 8/9 provider message. Patient is aware of appointments times.

## 2019-10-12 NOTE — Telephone Encounter (Signed)
Scheduled appointments per 8/6 los. Patient is aware of appointment dates and times.

## 2019-10-21 ENCOUNTER — Other Ambulatory Visit: Payer: Self-pay | Admitting: Internal Medicine

## 2019-10-25 ENCOUNTER — Other Ambulatory Visit: Payer: Self-pay | Admitting: Oncology

## 2019-10-26 ENCOUNTER — Other Ambulatory Visit: Payer: Self-pay

## 2019-10-26 NOTE — Patient Outreach (Signed)
[facility]) Care Management  [facility]  10/26/2019   [name] May 28, 1950 [id number]  Subjective: Telephone call to patient for bi-monthly check in.  Patient reports he is doing good. He goes for MRI for his liver cancer status on tomorrow.  He states that his sugars are doing good.  He states that his check this morning was 130.  Encouraged patient to continue sugar control by watching his diet, taking medications as prescribed and watching his diet.  He verbalized understanding and voices no concerns.  Objective:   Encounter Medications:  Outpatient Encounter Medications as of 10/26/2019  Medication Sig Note  . Accu-Chek FastClix Lancets MISC 1 each by Does not apply route 4 (four) times daily. E11.9   . albuterol (VENTOLIN HFA) 108 (90 Base) MCG/ACT inhaler Inhale 1-2 puffs into the lungs every 4 (four) hours as needed for wheezing or shortness of breath. (Patient not taking: Reported on 08/28/2019) 02/11/2019: On hand  . Alcohol Swabs (B-D SINGLE USE SWABS REGULAR) PADS Use to swab finger 4 times daily.   [name] aspirin EC 81 MG tablet Take 1 tablet (81 mg total) by mouth daily.   [name] atorvastatin (LIPITOR) 40 MG tablet TAKE 1 TABLET EVERY DAY   . betamethasone valerate ointment (VALISONE) 0.1 % Apply 1 application topically 2 (two) times daily.   . Blood Glucose Calibration (ACCU-CHEK GUIDE CONTROL) LIQD 1 each by Other route as needed (Use to calibrate meter prn). E11.9   . Blood Glucose Monitoring Suppl (ACCU-CHEK GUIDE ME) w/Device KIT 1 kit by Does not apply route See admin instructions. Check blood sugar 4 times a day   . cholecalciferol (VITAMIN D) 1000 UNITS tablet Take 1 tablet (1,000 Units total) by mouth daily.   . Cinnamon 500 MG capsule Take 500 mg by mouth 2 (two) times daily.   . Fluticasone-Salmeterol (ADVAIR) 100-50 MCG/DOSE AEPB Inhale 1 puff into the lungs 2 (two) times daily. (Patient not taking: Reported on 08/28/2019)   . glucose blood (ACCU-CHEK  GUIDE) test strip 1 each by Other route 4 (four) times daily.   [name] HOMEOPATHIC PRODUCTS PO Take by mouth daily. "Hyland's Leg Cramps"   . hydrOXYzine (ATARAX/VISTARIL) 25 MG tablet Take 1 tablet by mouth three times daily as needed   . insulin NPH-regular Human (NOVOLIN 70/30) (70-30) 100 UNIT/ML injection Inject 160 Units into the skin daily with breakfast.   . Insulin Syringe-Needle U-100 (BD VEO INSULIN SYRINGE U/F) 31G X 15/64" 1 ML MISC Use for injecting insulin once a day.   . latanoprost (XALATAN) 0.005 % ophthalmic solution Place 1 drop into both eyes daily.   [name] lisinopril-hydrochlorothiazide (ZESTORETIC) 20-25 MG tablet Take 1 tablet by mouth daily.   . methocarbamol (ROBAXIN) 500 MG tablet Take 1 tablet (500 mg total) by mouth 2 (two) times daily.   . metoCLOPramide (REGLAN) 5 MG tablet Take 1 tablet (5 mg total) by mouth every 8 (eight) hours as needed for nausea. (Patient not taking: Reported on 08/28/2019)   . Multiple Vitamins-Minerals (MULTIVITAMIN WITH MINERALS) tablet Take 1 tablet by mouth daily.   . Omega-3 Fatty Acids (FISH OIL) 1000 MG CAPS Take 2 capsules by mouth daily.   . pantoprazole (PROTONIX) 40 MG tablet TAKE 1 TABLET BY MOUTH ONCE DAILY BEFORE  BREAKFAST.   [name] prochlorperazine (COMPAZINE) 10 MG tablet Take 1 tablet (10 mg total) by mouth every 6 (six) hours as needed. (Patient not taking: Reported on 08/28/2019)   . sildenafil (REVATIO) 20 MG  tablet TAKE 5 TABLETS BY MOUTH DAILY AS NEEDED (Patient not taking: Reported on 08/28/2019)   . sildenafil (VIAGRA) 100 MG tablet Take 0.5-1 tablets (50-100 mg total) by mouth daily as needed for erectile dysfunction. (Patient not taking: Reported on 08/28/2019)   . traZODone (DESYREL) 50 MG tablet Take 1 tablet (50 mg total) by mouth at bedtime as needed for sleep.   [name] umeclidinium-vilanterol (ANORO ELLIPTA) 62.5-25 MCG/INH AEPB Inhale 1 puff into the lungs daily.    No facility-administered encounter medications on file as of  10/26/2019.    Functional Status:  In your present state of health, do you have any difficulty performing the following activities: 10/26/2019  Hearing? N  Vision? N  Difficulty concentrating or making decisions? N  Walking or climbing stairs? N  Dressing or bathing? N  Doing errands, shopping? N  Preparing Food and eating ? N  Using the Toilet? N  In the past six months, have you accidently leaked urine? N  Do you have problems with loss of bowel control? N  Managing your Medications? N  Managing your Finances? N  Housekeeping or managing your Housekeeping? N  Some recent data might be hidden    Fall/Depression Screening: Fall Risk  10/26/2019 08/24/2019 06/15/2019  Falls in the past year? 0 0 0  Number falls in past yr: - - -  Follow up - - -   PHQ 2/9 Scores 10/26/2019 06/15/2019 09/15/2018 09/02/2018 08/29/2018 08/23/2017 06/24/2015  PHQ - 2 Score 0 0 0 0 1 1 0  PHQ- 9 Score - - - - - 2 -    Assessment: Patient continues to manage chronic conditions and benefits from disease management follow up.    Plan:  [facility] CM Care Plan Problem One     Most Recent Value  Care Plan Problem One Knowledge Deficit in self management of Diabetes  Role Documenting the Problem One Care Management Telephonic [place] for Problem One Active  [facility] Long Term Goal  Patient will  verbalize a decrease in A1C from 9.6 within the next 90 days  [facility] Long Term Goal Start Date 10/26/19  Interventions for Problem One Long Term Goal Reiterated with patient checking sugars, diet, and activiity importance in controlling sugars.  No 200 blood sugars.      RN CM will contact patient in the month of October and patient agreeable.     [name], RN, MSN [place] Management Care Management Coordinator Direct Line [phone] Cell [phone] Toll Free: [phone]  Fax: [phone]

## 2019-10-27 ENCOUNTER — Other Ambulatory Visit: Payer: Self-pay

## 2019-10-27 ENCOUNTER — Ambulatory Visit (HOSPITAL_COMMUNITY)
Admission: RE | Admit: 2019-10-27 | Discharge: 2019-10-27 | Disposition: A | Discharge status: Home / Self Care | Payer: Medicare HMO | Source: Ambulatory Visit | Attending: Nurse Practitioner | Admitting: Nurse Practitioner

## 2019-10-27 DIAGNOSIS — K7460 Unspecified cirrhosis of liver: Secondary | ICD-10-CM | POA: Diagnosis not present

## 2019-10-27 DIAGNOSIS — N281 Cyst of kidney, acquired: Secondary | ICD-10-CM | POA: Diagnosis not present

## 2019-10-27 DIAGNOSIS — C220 Liver cell carcinoma: Secondary | ICD-10-CM

## 2019-10-27 DIAGNOSIS — I700 Atherosclerosis of aorta: Secondary | ICD-10-CM | POA: Diagnosis not present

## 2019-10-27 MED ORDER — GADOBUTROL 1 MMOL/ML IV SOLN
9.0000 mL | Freq: Once | INTRAVENOUS | Status: AC | PRN
  Administered 2019-10-27: 9 mL via INTRAVENOUS

## 2019-10-28 ENCOUNTER — Ambulatory Visit (INDEPENDENT_AMBULATORY_CARE_PROVIDER_SITE_OTHER): Payer: Medicare HMO | Admitting: Endocrinology

## 2019-10-28 ENCOUNTER — Other Ambulatory Visit: Payer: Self-pay

## 2019-10-28 ENCOUNTER — Encounter: Payer: Self-pay | Admitting: Endocrinology

## 2019-10-28 VITALS — BP 134/80 | HR 97 | Ht 64.0 in | Wt 198.2 lb

## 2019-10-28 DIAGNOSIS — E119 Type 2 diabetes mellitus without complications: Secondary | ICD-10-CM | POA: Diagnosis not present

## 2019-10-28 DIAGNOSIS — Z794 Long term (current) use of insulin: Secondary | ICD-10-CM | POA: Diagnosis not present

## 2019-10-28 LAB — POCT GLYCOSYLATED HEMOGLOBIN (HGB A1C): Hemoglobin A1C: 7.3 % — AB (ref 4.0–5.6)

## 2019-10-28 NOTE — Patient Instructions (Addendum)
check your blood sugar 4 times a day: before the 3 meals, and at bedtime.  also check if you have symptoms of your blood sugar being too high or too low.  please keep a record of the readings and bring it to your next appointment here (or you can bring the meter itself).  You can write it on any piece of paper.  please call us sooner if your blood sugar goes below 70, or if you have a lot of readings over 200.  Please take the same insulin: 160 units each morning.  However, if you are going to be active, take just 90 units. On this type of insulin schedule, you should eat meals on a regular schedule (especiallly lunch).  If a meal is missed or significantly delayed, your blood sugar could go low.   Please come back for a follow-up appointment in 2-3 months.

## 2019-10-28 NOTE — Progress Notes (Signed)
Subjective:    Patient ID: [name], male    DOB: June 08, 1950, 69 y.o.   MRN: [id number]  HPI Pt returns for f/u of diabetes mellitus: DM type: Insulin-requiring type 2.  Dx'ed: 3662 Complications: PAD and stage 3 CRI Therapy: insulin since 2016.   DKA: never Severe hypoglycemia: never.  Pancreatitis: never.   SDOH: he was advised to see CDE to review insulin injections, but he did not go; he takes human insulin, due to cost.    Other: he declines multiple daily injections; he is chronically off and on prednisone, for asthma; he changed lantus to NPH, then 70/30, due to pattern of cbg's; ins declines levemir.   Interval history: no cbg record, but states cbg varies from 72-180.  No recent steroids.  He says he never misses the insulin.  She has mild hypoglycemia in the afternoon, approx once per month.  This happens with exertion, even with the reduced dosage.   Past Medical History:  Diagnosis Date  . Alcoholism ([place])   . Arthritis    "knees, hands" (03/08/2015)  . Asthma   . Chronic bronchitis ([place])   . Chronic diastolic CHF (congestive heart failure) ([place])    notes 03/08/2015  . Cirrhosis ([place])   . COPD (chronic obstructive pulmonary disease) ([place])   . Coronary artery disease   . Hepatitis C   . High cholesterol   . Hypertension   . Pneumonia 03/2015  . Shortness of breath dyspnea   . Type II diabetes mellitus ([place]) 2014    Past Surgical History:  Procedure Laterality Date  . CATARACT EXTRACTION Left 02/2014  . ESOPHAGOGASTRODUODENOSCOPY  02/09/2011   Procedure: ESOPHAGOGASTRODUODENOSCOPY (EGD);  Surgeon: Missy Sabins, MD;  Location[facility] ENDOSCOPY;  Service: Endoscopy;  Laterality: N/A;  . 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 Marital status: Divorced    Spouse name: Not on file  . Number of children: 4  . Years of education: Not on file  . Highest education level: Not on file  Occupational History  . Occupation:  disabled  Tobacco Use  . Smoking status: Former Smoker    Packs/day: 1.50    Years: 46.00    Pack years: 69.00    Types: Cigarettes    Quit date: 03/19/2013    Years since quitting: 6.6  . Smokeless tobacco: Never Used  Vaping Use  . Vaping Use: Never used  Substance and Sexual Activity  . Alcohol use: No    Alcohol/week: 0.0 standard drinks    Comment: "recovering alcoholic; 9/47/6546"  . Drug use: No  . Sexual activity: Not Currently  Other Topics Concern  . Not on file  Social History Narrative  . Not on file   Social Determinants of Health   Financial Resource Strain:   . Difficulty of Paying Living Expenses: Not on file  Food Insecurity: No Food Insecurity  . Worried About Charity fundraiser in the Last Year: Never true  . Ran Out of Food in the Last Year: Never true  Transportation Needs: No Transportation Needs  . Lack of Transportation (Medical): No  . Lack of Transportation (Non-Medical): No  Physical Activity:   . Days of Exercise per Week: Not on file  . Minutes of Exercise per Session: Not on file  Stress:   . Feeling of Stress : Not on file  Social Connections:   . Frequency of Communication with Friends and Family: Not on  file  . Frequency of Social Gatherings with Friends and Family: Not on file  . Attends Religious Services: Not on file  . Active Member of Clubs or Organizations: Not on file  . Attends Archivist Meetings: Not on file  . Marital Status: Not on file  Intimate Partner Violence:   . Fear of Current or Ex-Partner: Not on file  . Emotionally Abused: Not on file  . Physically Abused: Not on file  . Sexually Abused: Not on file    Current Outpatient Medications on File Prior to Visit  Medication Sig Dispense Refill  . Accu-Chek FastClix Lancets MISC 1 each by Does not apply route 4 (four) times daily. E11.9 360 each 0  . albuterol (VENTOLIN HFA) 108 (90 Base) MCG/ACT inhaler Inhale 1-2 puffs into the lungs every 4 (four) hours  as needed for wheezing or shortness of breath. 54 each 3  . Alcohol Swabs (B-D SINGLE USE SWABS REGULAR) PADS Use to swab finger 4 times daily. 300 each 11  . aspirin EC 81 MG tablet Take 1 tablet (81 mg total) by mouth daily. 30 tablet 3  . atorvastatin (LIPITOR) 40 MG tablet TAKE 1 TABLET EVERY DAY 90 tablet 1  . betamethasone valerate ointment (VALISONE) 0.1 % Apply 1 application topically 2 (two) times daily. 100 g 3  . Blood Glucose Calibration (ACCU-CHEK GUIDE CONTROL) LIQD 1 each by Other route as needed (Use to calibrate meter prn). E11.9 1 each 0  . Blood Glucose Monitoring Suppl (ACCU-CHEK GUIDE ME) w/Device KIT 1 kit by Does not apply route See admin instructions. Check blood sugar 4 times a day 1 kit 0  . cholecalciferol (VITAMIN D) 1000 UNITS tablet Take 1 tablet (1,000 Units total) by mouth daily. 30 tablet 0  . Cinnamon 500 MG capsule Take 500 mg by mouth 2 (two) times daily.    . Fluticasone-Salmeterol (ADVAIR) 100-50 MCG/DOSE AEPB Inhale 1 puff into the lungs 2 (two) times daily. 3 each 3  . glucose blood (ACCU-CHEK GUIDE) test strip 1 each by Other route 4 (four) times daily. 400 each 4  . HOMEOPATHIC PRODUCTS PO Take by mouth daily. "Hyland's Leg Cramps"    . hydrOXYzine (ATARAX/VISTARIL) 25 MG tablet Take 1 tablet by mouth three times daily as needed 60 tablet 2  . insulin NPH-regular Human (NOVOLIN 70/30) (70-30) 100 UNIT/ML injection Inject 160 Units into the skin daily with breakfast. 60 mL 11  . Insulin Syringe-Needle U-100 (BD VEO INSULIN SYRINGE U/F) 31G X 15/64" 1 ML MISC Use for injecting insulin once a day. 90 each 4  . latanoprost (XALATAN) 0.005 % ophthalmic solution Place 1 drop into both eyes daily.    [name] lisinopril-hydrochlorothiazide (ZESTORETIC) 20-25 MG tablet Take 1 tablet by mouth daily. 90 tablet 3  . methocarbamol (ROBAXIN) 500 MG tablet Take 1 tablet (500 mg total) by mouth 2 (two) times daily. 20 tablet 0  . metoCLOPramide (REGLAN) 5 MG tablet Take 1 tablet  (5 mg total) by mouth every 8 (eight) hours as needed for nausea. 60 tablet 0  . Multiple Vitamins-Minerals (MULTIVITAMIN WITH MINERALS) tablet Take 1 tablet by mouth daily.    . Omega-3 Fatty Acids (FISH OIL) 1000 MG CAPS Take 2 capsules by mouth daily.    . pantoprazole (PROTONIX) 40 MG tablet TAKE 1 TABLET BY MOUTH ONCE DAILY BEFORE  BREAKFAST. 30 tablet 1  . prochlorperazine (COMPAZINE) 10 MG tablet Take 1 tablet (10 mg total) by mouth every 6 (six) hours as  needed. 60 tablet 1  . sildenafil (REVATIO) 20 MG tablet TAKE 5 TABLETS BY MOUTH DAILY AS NEEDED 10 tablet 6  . sildenafil (VIAGRA) 100 MG tablet Take 0.5-1 tablets (50-100 mg total) by mouth daily as needed for erectile dysfunction. 5 tablet 3  . traZODone (DESYREL) 50 MG tablet Take 1 tablet (50 mg total) by mouth at bedtime as needed for sleep. 90 tablet 0  . umeclidinium-vilanterol (ANORO ELLIPTA) 62.5-25 MCG/INH AEPB Inhale 1 puff into the lungs daily.     No current facility-administered medications on file prior to visit.    Allergies  Allergen Reactions  . Crestor [Rosuvastatin] Other (See Comments)    INTOLERANCE   . Metformin And Related Other (See Comments)    Stomach ache  . Mobic [Meloxicam] Rash    Family History  Problem Relation Age of Onset  . Stroke Mother   . Diabetes Mother   . Hypertension Mother   . Hyperlipidemia Mother   . Heart disease Father   . Hypertension Father   . Heart attack Father     BP 134/80   Pulse 97   Ht '5\' 4"'  (1.626 m)   Wt 198 lb 3.2 oz (89.9 kg)   SpO2 98%   BMI 34.02 kg/m    Review of Systems     Objective:   Physical Exam VITAL SIGNS:  See vs page GENERAL: no distress Pulses: dorsalis pedis intact bilat.   MSK: no deformity of the feet CV: no leg edema Skin:  no ulcer on the feet.  normal color and temp on the feet. Neuro: sensation is intact to touch on the feet Ext: there is bilateral onychomycosis of the toenails   Lab Results  Component Value Date    HGBA1C 7.3 (A) 10/28/2019       Assessment & Plan:  Insulin-requiring type 2 DM, with PAD Hypoglycemia, due to insulin: this limits aggressiveness of glycemic control   Patient Instructions  check your blood sugar 4 times a day: before the 3 meals, and at bedtime.  also check if you have symptoms of your blood sugar being too high or too low.  please keep a record of the readings and bring it to your next appointment here (or you can bring the meter itself).  You can write it on any piece of paper.  please call us sooner if your blood sugar goes below 70, or if you have a lot of readings over 200.  Please take the same insulin: 160 units each morning.  However, if you are going to be active, take just 90 units. On this type of insulin schedule, you should eat meals on a regular schedule (especiallly lunch).  If a meal is missed or significantly delayed, your blood sugar could go low.   Please come back for a follow-up appointment in 2-3 months.

## 2019-10-30 ENCOUNTER — Ambulatory Visit: Payer: Medicare HMO | Admitting: Nurse Practitioner

## 2019-10-30 ENCOUNTER — Inpatient Hospital Stay (HOSPITAL_BASED_OUTPATIENT_CLINIC_OR_DEPARTMENT_OTHER): Payer: Medicare HMO | Admitting: Oncology

## 2019-10-30 ENCOUNTER — Telehealth: Payer: Self-pay

## 2019-10-30 ENCOUNTER — Other Ambulatory Visit: Payer: Medicare HMO

## 2019-10-30 ENCOUNTER — Other Ambulatory Visit: Payer: Self-pay

## 2019-10-30 ENCOUNTER — Inpatient Hospital Stay: Payer: Medicare HMO

## 2019-10-30 ENCOUNTER — Telehealth: Payer: Self-pay | Admitting: Oncology

## 2019-10-30 ENCOUNTER — Ambulatory Visit: Payer: Medicare HMO

## 2019-10-30 ENCOUNTER — Telehealth: Payer: Self-pay | Admitting: Pharmacist

## 2019-10-30 VITALS — BP 156/65 | HR 96 | Temp 97.8°F | Resp 18 | Ht 64.0 in | Wt 197.8 lb

## 2019-10-30 DIAGNOSIS — J449 Chronic obstructive pulmonary disease, unspecified: Secondary | ICD-10-CM | POA: Diagnosis not present

## 2019-10-30 DIAGNOSIS — C220 Liver cell carcinoma: Secondary | ICD-10-CM | POA: Diagnosis not present

## 2019-10-30 DIAGNOSIS — Z23 Encounter for immunization: Secondary | ICD-10-CM

## 2019-10-30 DIAGNOSIS — I5032 Chronic diastolic (congestive) heart failure: Secondary | ICD-10-CM | POA: Diagnosis not present

## 2019-10-30 DIAGNOSIS — I110 Hypertensive heart disease with heart failure: Secondary | ICD-10-CM | POA: Diagnosis not present

## 2019-10-30 DIAGNOSIS — E119 Type 2 diabetes mellitus without complications: Secondary | ICD-10-CM | POA: Diagnosis not present

## 2019-10-30 DIAGNOSIS — K7460 Unspecified cirrhosis of liver: Secondary | ICD-10-CM | POA: Diagnosis not present

## 2019-10-30 DIAGNOSIS — I2510 Atherosclerotic heart disease of native coronary artery without angina pectoris: Secondary | ICD-10-CM | POA: Diagnosis not present

## 2019-10-30 DIAGNOSIS — Z5112 Encounter for antineoplastic immunotherapy: Secondary | ICD-10-CM | POA: Diagnosis not present

## 2019-10-30 LAB — CBC WITH DIFFERENTIAL (CANCER CENTER ONLY)
Abs Immature Granulocytes: 0.02 10*3/uL (ref 0.00–0.07)
Basophils Absolute: 0.1 10*3/uL (ref 0.0–0.1)
Basophils Relative: 1 %
Eosinophils Absolute: 0.3 10*3/uL (ref 0.0–0.5)
Eosinophils Relative: 4 %
HCT: 45.1 % (ref 39.0–52.0)
Hemoglobin: 15 g/dL (ref 13.0–17.0)
Immature Granulocytes: 0 %
Lymphocytes Relative: 34 %
Lymphs Abs: 2.1 10*3/uL (ref 0.7–4.0)
MCH: 27.2 pg (ref 26.0–34.0)
MCHC: 33.3 g/dL (ref 30.0–36.0)
MCV: 81.9 fL (ref 80.0–100.0)
Monocytes Absolute: 0.5 10*3/uL (ref 0.1–1.0)
Monocytes Relative: 8 %
Neutro Abs: 3.3 10*3/uL (ref 1.7–7.7)
Neutrophils Relative %: 53 %
Platelet Count: 315 10*3/uL (ref 150–400)
RBC: 5.51 MIL/uL (ref 4.22–5.81)
RDW: 15.3 % (ref 11.5–15.5)
WBC Count: 6.2 10*3/uL (ref 4.0–10.5)
nRBC: 0 % (ref 0.0–0.2)

## 2019-10-30 LAB — CMP (CANCER CENTER ONLY)
ALT: 100 U/L — ABNORMAL HIGH (ref 0–44)
AST: 79 U/L — ABNORMAL HIGH (ref 15–41)
Albumin: 3.6 g/dL (ref 3.5–5.0)
Alkaline Phosphatase: 155 U/L — ABNORMAL HIGH (ref 38–126)
Anion gap: 10 (ref 5–15)
BUN: 26 mg/dL — ABNORMAL HIGH (ref 8–23)
CO2: 21 mmol/L — ABNORMAL LOW (ref 22–32)
Calcium: 9.5 mg/dL (ref 8.9–10.3)
Chloride: 103 mmol/L (ref 98–111)
Creatinine: 1.25 mg/dL — ABNORMAL HIGH (ref 0.61–1.24)
GFR, Est AFR Am: >60 mL/min (ref >60)
GFR, Estimated: 59 mL/min — ABNORMAL LOW (ref >60)
Glucose, Bld: 272 mg/dL — ABNORMAL HIGH (ref 70–99)
Potassium: 4 mmol/L (ref 3.5–5.1)
Sodium: 134 mmol/L — ABNORMAL LOW (ref 135–145)
Total Bilirubin: 0.8 mg/dL (ref 0.3–1.2)
Total Protein: 7.6 g/dL (ref 6.5–8.1)

## 2019-10-30 LAB — TOTAL PROTEIN, URINE DIPSTICK: Protein, ur: NEGATIVE mg/dL

## 2019-10-30 MED ORDER — LENVATINIB (12 MG DAILY DOSE) 3 X 4 MG PO CPPK
12.0000 mg | ORAL_CAPSULE | Freq: Every day | ORAL | 0 refills | Status: DC
Start: 2019-11-06 — End: 2020-01-05

## 2019-10-30 NOTE — Telephone Encounter (Signed)
Scheduled per los. Gave avs and calendar  

## 2019-10-30 NOTE — Progress Notes (Signed)
[facility] OFFICE PROGRESS NOTE   Diagnosis: Hepatocellular carcinoma  INTERVAL HISTORY:   Mr. Wilden completed another cycle of atezolizumab and bevacizumab on 10/09/2019.  He tolerated the treatment well.  He feels well.  No complaint.  Objective:  Vital signs in last 24 hours:  Blood pressure (!) 156/65, pulse 96, temperature 97.8 F (36.6 C), temperature source Tympanic, resp. rate 18, height 5\' 4"  (1.626 m), weight 197 lb 12.8 oz (89.7 kg), SpO2 100 %.    Lymphatics: No cervical, supraclavicular, axillary, or inguinal nodes Resp: Lungs clear bilaterally Cardio: Regular rate and rhythm GI: No hepatosplenomegaly, nontender, no mass Vascular: No leg edema Skin: Hyperpigmented lesions over the trunk and extremities   Lab Results:  Lab Results  Component Value Date   WBC 6.2 10/30/2019   HGB 15.0 10/30/2019   HCT 45.1 10/30/2019   MCV 81.9 10/30/2019   PLT 315 10/30/2019   NEUTROABS 3.3 10/30/2019    CMP  Lab Results  Component Value Date   NA 134 (L) 10/30/2019   K 4.0 10/30/2019   CL 103 10/30/2019   CO2 21 (L) 10/30/2019   GLUCOSE 272 (H) 10/30/2019   BUN 26 (H) 10/30/2019   CREATININE 1.25 (H) 10/30/2019   CALCIUM 9.5 10/30/2019   PROT 7.6 10/30/2019   ALBUMIN 3.6 10/30/2019   AST 79 (H) 10/30/2019   ALT 100 (H) 10/30/2019   ALKPHOS 155 (H) 10/30/2019   BILITOT 0.8 10/30/2019   GFRNONAA 59 (L) 10/30/2019   GFRAA >60 10/30/2019     Imaging:  MR LIVER W WO CONTRAST  Result Date: 10/27/2019 CLINICAL DATA:  Hepatocellular carcinoma restaging and assessment of treatment response interval cycles of atezolizumab/bevacizumab. EXAM: MRI ABDOMEN WITHOUT AND WITH CONTRAST TECHNIQUE: Multiplanar multisequence MR imaging of the abdomen was performed both before and after the administration of intravenous contrast. CONTRAST:  39mL GADAVIST GADOBUTROL 1 MMOL/ML IV SOLN COMPARISON:  06/24/2019 FINDINGS: Lower chest: Unremarkable Hepatobiliary:  Multifocal T2 signal hyperintensities in all segments of the liver compatible with multifocal hepatocellular carcinoma can stay a background of hepatic cirrhosis. The lesions have mildly indistinct margins but are probably best measured on the axial T2 fat saturated images. On image 15/9, an index lesion in the lateral segment left hepatic lobe measures 2.3 by 2.2 cm, formerly 1.7 by 1.7 cm. Another index lesion also in the lateral segment left hepatic lobe measures 2.8 by 2.2 cm, formerly 1.9 by 1.7 cm. Other lesions similarly appear increased in size. Infiltrative mass with internal heterogeneity posteriorly in the right hepatic lobe. Overall tumor burden in the liver appears increased. No biliary dilatation.  Gallbladder unremarkable. Pancreas:  Unremarkable Spleen:  Unremarkable Adrenals/Urinary Tract: 1.1 cm Bosniak category 2 cyst of the right kidney upper pole medially on image 58/100, no observed enhancement. 0.7 cm benign cyst of the left kidney lower pole. Kidneys and adrenal glands appear otherwise normal. Stomach/Bowel: Unremarkable Vascular/Lymphatic: Aortoiliac atherosclerotic vascular disease. No pathologic adenopathy. Other:  No supplemental non-categorized findings. 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 IMPRESSION: 1. Enlargement of most of the multifocal hepatocellular carcinoma lesions in the liver compatible with worsening of disease. 2. Hepatic cirrhosis. 3. Bosniak category 2 (complex, but benign) cyst of the right kidney upper pole medially. Benign cyst of the left kidney lower pole. 4.  Aortic Atherosclerosis (ICD10-I70.0). Electronically Signed   By: Van Clines M.D.   On: 10/27/2019 12:25    Medications: I have reviewed the patient's current medications.   Assessment/Plan: 1.  Hepatocellular carcinoma  02/12/2019 AFP 10,200  02/18/2019 CT abdomen-dominant poorly marginated heterogeneously enhancing 8.6 x 7.7 cm posterior right liver lobe mass,  new compared to CT study 09/10/2016. Numerous similar-appearing liver masses scattered throughout the liver all new since 09/10/2016 CT. Diffusely irregular liver surface compatible with cirrhosis. Spleen normal size. No ascites. No abdominal adenopathy.  MRI liver 03/05/2019-multifocal hepatic metastases including a 10 cm lesion in the right liver,Li-rads 5, no evidence of metastatic disease  Cycle 1 atezolizumab/bevacizumab 03/20/2019  Cycle 2 atezolizumab/bevacizumab 04/24/2019  Cycle 3 atezolizumab/bevacizumab 05/15/2019  Cycle 4 atezolizumab/bevacizumab 06/05/2019  MRI liver 06/24/2019-multifocal hepatocellular carcinoma, lesions stable to decreased in size. No evidence for disease progression. Cirrhosis.  Cycle 5 atezolizumab/bevacizumab 06/25/2019  Cycle 6 atezolizumab/bevacizumab 07/17/2019  Cycle 7 atezolizumab/bevacizumab 08/07/2019  Cycle 8 atezolizumab/bevacizumab 08/28/2019  Cycle 9 atezolizumab/bevacizumab 09/18/2019  Cycle 10 atezolizumab/bevacizumab 10/09/2019  MRI liver 10/27/2019-enlargement of multifocal liver lesions, cirrhosis 2. 01/20/2019 chest CT-no definite evidence of pulmonary embolus. Wall thickening of the distal esophagus. Nodular hepatic contours. 3. Cirrhosis  4. Hepatitis C status post Harvoni 5. Type 2 diabetes 6. COPD 7. CAD 8. Chronic diastolic heart failure 9. Hypertension 10. Positive Cologuard 2019    Disposition: Mr. Mollenkopf has completed 10 treatments with atezolizumab/bevacizumab.  The restaging MRI earlier this week is consistent with disease progression involving multiple liver lesions.  I reviewed the MRI images with Mr.Apple.  The AFP has been slightly higher over the past few months.  I recommend discontinuing the atezolizumab/bevacizumab.  We discussed treatment options.  I recommend changing to a different systemic therapy.  I recommend lenvatinib.  We reviewed potential toxicities associated with lenvatinib including the chance of a rash,  diarrhea, hypertension, thromboembolic disease, nephrotoxicity, bleeding, and liver toxicity.  He agrees to proceed.  He will be contacted by [facility] pharmacy for further discussion of toxicities and to arrange for drug delivery.  The anticipated start date for lenvatinib is 11/06/2019.  He will return for an office and lab visit on 11/20/2019.  Mr. Ehle received the COVID-19 booster vaccine today.  Betsy Coder, MD  10/30/2019  9:52 AM

## 2019-10-30 NOTE — Progress Notes (Signed)
   Covid-19 Vaccination Clinic  Name:  [name]    MRN: [id number] DOB: September 14, 1950  10/30/2019  [name] was observed post Covid-19 immunization for 15 minutes without incident. He was provided with Vaccine Information Sheet and instruction to access the V-Safe system.   [name] was instructed to call 911 with any severe reactions post vaccine: [name] Difficulty breathing  . Swelling of face and throat  . A fast heartbeat  . A bad rash all over body  . Dizziness and weakness

## 2019-10-30 NOTE — Telephone Encounter (Signed)
Oral Oncology Patient Advocate Encounter  Received notification from [facility] that prior authorization for [name] is required.  PA submitted on CoverMyMeds Key [id number] Status is pending  Oral Oncology Clinic will continue to follow.  [name] Patient [place] Phone [phone] Fax [phone] 10/30/2019 2:36 PM

## 2019-10-30 NOTE — Telephone Encounter (Signed)
Oral Oncology Patient Advocate Encounter  Prior Authorization for Michel Santee has been approved.    PA# [id number] Effective dates: 10/30/19 through 03/04/20  Patients co-pay is $9.20  Oral Oncology Clinic will continue to follow.   [place] Patient [place] Phone [phone] Fax [phone] 10/30/2019 4:20 PM

## 2019-10-30 NOTE — Telephone Encounter (Signed)
Oral Oncology Pharmacist Encounter  Received new prescription for Lenvima (lenvatinib) for the treatment of hepatocellular carcinoma, planned duration until disease progression or unacceptable drug toxicity.  Prescription dose and frequency assessed for appropriateness. OK for therapy initiation.   CBC w/ Diff and CMP from 10/30/19 assessed, noted Scr 1.25 mg/dL (CrCl ~71 mL/min); LFTs slightly above ULN - no dose adjustments necessary at this time.  Current medication list in Epic reviewed, DDIs with Lenvima identified:  Category C DDI's between [place] and the following agents regarding risk for possible Qtc prolongation: Advair Diskus, Anoro Ellipta, Hydroxyzine, Metoclopramide, Trazodone and Ventolin HFA. Recommended to monitor Qtc as clinically indicated throughout treatment. Last EKG in chart was from 01/21/2019 with Qtc of 452 ms - will ask MD if he would like a baseline EKG repeated prior to initiation of Lenvima.   Evaluated chart and no patient barriers to medication adherence noted.   Prescription has been e-scribed to the [facility] for benefits analysis and approval.  Oral Oncology Clinic will continue to follow for insurance authorization, copayment issues, initial counseling and start date.  Leron Croak, PharmD, BCPS Hematology/Oncology Clinical Pharmacist [facility] [phone] 10/30/2019 3:12 PM

## 2019-10-31 ENCOUNTER — Other Ambulatory Visit: Payer: Self-pay | Admitting: Internal Medicine

## 2019-10-31 LAB — AFP TUMOR MARKER: AFP, Serum, Tumor Marker: 21753 ng/mL — ABNORMAL HIGH (ref 0.0–8.3)

## 2019-11-03 ENCOUNTER — Telehealth: Payer: Self-pay | Admitting: *Deleted

## 2019-11-03 DIAGNOSIS — C220 Liver cell carcinoma: Secondary | ICD-10-CM

## 2019-11-03 NOTE — Telephone Encounter (Signed)
Called patient that after pharmacist review of his current medications and the lenvatinib, there is potential for prolongation of QT interval, thus heart arrhythmia. Need to obtain a baseline EKG before he starts treatment. He agrees to come tomorrow for EKG. High priority scheduling message sent for tomorrow afternoon.

## 2019-11-04 ENCOUNTER — Inpatient Hospital Stay: Payer: Medicare HMO | Attending: Nurse Practitioner

## 2019-11-04 ENCOUNTER — Other Ambulatory Visit: Payer: Self-pay

## 2019-11-04 DIAGNOSIS — E860 Dehydration: Secondary | ICD-10-CM | POA: Insufficient documentation

## 2019-11-04 DIAGNOSIS — C220 Liver cell carcinoma: Secondary | ICD-10-CM | POA: Insufficient documentation

## 2019-11-04 DIAGNOSIS — J449 Chronic obstructive pulmonary disease, unspecified: Secondary | ICD-10-CM | POA: Insufficient documentation

## 2019-11-04 DIAGNOSIS — K7460 Unspecified cirrhosis of liver: Secondary | ICD-10-CM | POA: Insufficient documentation

## 2019-11-04 DIAGNOSIS — E119 Type 2 diabetes mellitus without complications: Secondary | ICD-10-CM | POA: Insufficient documentation

## 2019-11-04 DIAGNOSIS — R197 Diarrhea, unspecified: Secondary | ICD-10-CM | POA: Insufficient documentation

## 2019-11-04 DIAGNOSIS — I5032 Chronic diastolic (congestive) heart failure: Secondary | ICD-10-CM | POA: Insufficient documentation

## 2019-11-04 DIAGNOSIS — I110 Hypertensive heart disease with heart failure: Secondary | ICD-10-CM | POA: Insufficient documentation

## 2019-11-04 DIAGNOSIS — I2510 Atherosclerotic heart disease of native coronary artery without angina pectoris: Secondary | ICD-10-CM | POA: Insufficient documentation

## 2019-11-04 DIAGNOSIS — B1920 Unspecified viral hepatitis C without hepatic coma: Secondary | ICD-10-CM | POA: Insufficient documentation

## 2019-11-04 DIAGNOSIS — Z79899 Other long term (current) drug therapy: Secondary | ICD-10-CM | POA: Insufficient documentation

## 2019-11-04 DIAGNOSIS — M25511 Pain in right shoulder: Secondary | ICD-10-CM | POA: Insufficient documentation

## 2019-11-05 ENCOUNTER — Telehealth: Payer: Self-pay | Admitting: Oncology

## 2019-11-05 NOTE — Telephone Encounter (Signed)
Rescheduled appointments per 9/2 provider message. Patient is aware of appointment change.

## 2019-11-10 MED FILL — LENVIMA 12 MG DAILY DOSE 4: 3 X 4 | 30 days supply | Qty: 90 | Fill #0

## 2019-11-10 NOTE — Telephone Encounter (Signed)
Oral Chemotherapy Pharmacist Encounter  I spoke with patient for overview of: Lenvima (lenvatinib) for the treatment of hepatocellular carcinoma, planned duration until disease progression or unacceptable toxicity.   Counseled patient on administration, dosing, side effects, monitoring, drug-food interactions, safe handling, storage, and disposal.  Patient will take Lenvima 4mg  capsules, 3 capsules (12mg ) by mouth once daily, with or without food, at approximately the same time each day.  Lenvima start date: 11/11/19  Adverse effects include but are not limited to: hypertension, hand-foot syndrome, diarrhea, joint pain, fatigue, headache, decreased calcium, proteinuria, increased risk of blood clots, and cardiac conduction issues.    Patient will obtain anti diarrheal and alert the office of 4 or more loose stools above baseline.   Baseline EKG was obtained at office - MD assessed EKG and QTc was stable and WNL (~457 ms)  Patient instructed to notify office of any upcoming invasive procedures.  [name] will be held for 6 days prior to scheduled surgery, restart based on healing and clinical judgement.   Reviewed with patient importance of keeping a medication schedule and plan for any missed doses. No barriers to medication adherence identified.  Medication reconciliation performed and medication/allergy list updated.  Medication education handout placed in mail for patient.   Insurance authorization for [name] has been obtained. Patient will pick up from [facility] on 11/10/19.  Patient informed the pharmacy will reach out 5-7 days prior to needing next fill of Lenvima to coordinate continued medication acquisition to prevent break in therapy.  All questions answered.  [name] voiced understanding and appreciation.   Patient knows to call the office with questions or concerns.  Leron Croak, PharmD, BCPS Hematology/Oncology Clinical Pharmacist [facility] [phone] 11/10/2019 3:26 PM

## 2019-11-11 ENCOUNTER — Telehealth: Payer: Self-pay | Admitting: *Deleted

## 2019-11-11 NOTE — Telephone Encounter (Signed)
Called to report he started his lenvatinib 12 mg daily on 11/10/19. BP today was 158/98. He will check it daily and was told to call for DBP > 100

## 2019-11-12 ENCOUNTER — Other Ambulatory Visit: Payer: Self-pay | Admitting: Internal Medicine

## 2019-11-20 ENCOUNTER — Other Ambulatory Visit: Payer: Medicare HMO

## 2019-11-20 ENCOUNTER — Ambulatory Visit: Payer: Medicare HMO | Admitting: Nurse Practitioner

## 2019-11-20 ENCOUNTER — Ambulatory Visit: Payer: Medicare HMO

## 2019-11-20 ENCOUNTER — Other Ambulatory Visit: Payer: Self-pay | Admitting: Internal Medicine

## 2019-11-23 ENCOUNTER — Telehealth: Payer: Self-pay | Admitting: Oncology

## 2019-11-23 ENCOUNTER — Other Ambulatory Visit: Payer: Self-pay | Admitting: *Deleted

## 2019-11-23 ENCOUNTER — Ambulatory Visit (HOSPITAL_COMMUNITY)
Admission: RE | Admit: 2019-11-23 | Discharge: 2019-11-23 | Disposition: A | Discharge status: Home / Self Care | Payer: Medicare HMO | Source: Ambulatory Visit | Attending: Oncology | Admitting: Oncology

## 2019-11-23 ENCOUNTER — Inpatient Hospital Stay: Payer: Medicare HMO

## 2019-11-23 ENCOUNTER — Other Ambulatory Visit: Payer: Self-pay

## 2019-11-23 ENCOUNTER — Other Ambulatory Visit: Payer: Medicare HMO

## 2019-11-23 ENCOUNTER — Inpatient Hospital Stay (HOSPITAL_BASED_OUTPATIENT_CLINIC_OR_DEPARTMENT_OTHER): Payer: Medicare HMO | Admitting: Oncology

## 2019-11-23 ENCOUNTER — Other Ambulatory Visit: Payer: Self-pay | Admitting: Oncology

## 2019-11-23 ENCOUNTER — Ambulatory Visit: Payer: Medicare HMO | Admitting: Oncology

## 2019-11-23 VITALS — BP 139/91 | HR 116 | Temp 97.8°F | Resp 18 | Ht 64.0 in | Wt 194.3 lb

## 2019-11-23 DIAGNOSIS — C220 Liver cell carcinoma: Secondary | ICD-10-CM

## 2019-11-23 DIAGNOSIS — E119 Type 2 diabetes mellitus without complications: Secondary | ICD-10-CM | POA: Diagnosis not present

## 2019-11-23 DIAGNOSIS — Z79899 Other long term (current) drug therapy: Secondary | ICD-10-CM | POA: Diagnosis not present

## 2019-11-23 DIAGNOSIS — R197 Diarrhea, unspecified: Secondary | ICD-10-CM | POA: Diagnosis not present

## 2019-11-23 DIAGNOSIS — I5032 Chronic diastolic (congestive) heart failure: Secondary | ICD-10-CM | POA: Diagnosis not present

## 2019-11-23 DIAGNOSIS — M25511 Pain in right shoulder: Secondary | ICD-10-CM | POA: Diagnosis not present

## 2019-11-23 DIAGNOSIS — M79601 Pain in right arm: Secondary | ICD-10-CM | POA: Diagnosis not present

## 2019-11-23 DIAGNOSIS — I110 Hypertensive heart disease with heart failure: Secondary | ICD-10-CM | POA: Diagnosis not present

## 2019-11-23 DIAGNOSIS — B1920 Unspecified viral hepatitis C without hepatic coma: Secondary | ICD-10-CM | POA: Diagnosis not present

## 2019-11-23 DIAGNOSIS — C801 Malignant (primary) neoplasm, unspecified: Secondary | ICD-10-CM | POA: Diagnosis not present

## 2019-11-23 DIAGNOSIS — E860 Dehydration: Secondary | ICD-10-CM | POA: Diagnosis not present

## 2019-11-23 DIAGNOSIS — K7460 Unspecified cirrhosis of liver: Secondary | ICD-10-CM | POA: Diagnosis not present

## 2019-11-23 DIAGNOSIS — J449 Chronic obstructive pulmonary disease, unspecified: Secondary | ICD-10-CM | POA: Diagnosis not present

## 2019-11-23 DIAGNOSIS — I2510 Atherosclerotic heart disease of native coronary artery without angina pectoris: Secondary | ICD-10-CM | POA: Diagnosis not present

## 2019-11-23 DIAGNOSIS — M19011 Primary osteoarthritis, right shoulder: Secondary | ICD-10-CM | POA: Diagnosis not present

## 2019-11-23 LAB — CMP (CANCER CENTER ONLY)
ALT: 124 U/L — ABNORMAL HIGH (ref 0–44)
AST: 109 U/L — ABNORMAL HIGH (ref 15–41)
Albumin: 3.7 g/dL (ref 3.5–5.0)
Alkaline Phosphatase: 184 U/L — ABNORMAL HIGH (ref 38–126)
Anion gap: 11 (ref 5–15)
BUN: 21 mg/dL (ref 8–23)
CO2: 21 mmol/L — ABNORMAL LOW (ref 22–32)
Calcium: 9.6 mg/dL (ref 8.9–10.3)
Chloride: 100 mmol/L (ref 98–111)
Creatinine: 1.59 mg/dL — ABNORMAL HIGH (ref 0.61–1.24)
GFR, Est AFR Am: 51 mL/min — ABNORMAL LOW (ref >60)
GFR, Estimated: 44 mL/min — ABNORMAL LOW (ref >60)
Glucose, Bld: 163 mg/dL — ABNORMAL HIGH (ref 70–99)
Potassium: 4.7 mmol/L (ref 3.5–5.1)
Sodium: 132 mmol/L — ABNORMAL LOW (ref 135–145)
Total Bilirubin: 1.3 mg/dL — ABNORMAL HIGH (ref 0.3–1.2)
Total Protein: 8.8 g/dL — ABNORMAL HIGH (ref 6.5–8.1)

## 2019-11-23 LAB — CBC WITH DIFFERENTIAL (CANCER CENTER ONLY)
Abs Immature Granulocytes: 0.01 10*3/uL (ref 0.00–0.07)
Basophils Absolute: 0.1 10*3/uL (ref 0.0–0.1)
Basophils Relative: 1 %
Eosinophils Absolute: 0 10*3/uL (ref 0.0–0.5)
Eosinophils Relative: 1 %
HCT: 53.9 % — ABNORMAL HIGH (ref 39.0–52.0)
Hemoglobin: 18 g/dL — ABNORMAL HIGH (ref 13.0–17.0)
Immature Granulocytes: 0 %
Lymphocytes Relative: 33 %
Lymphs Abs: 2.4 10*3/uL (ref 0.7–4.0)
MCH: 26.3 pg (ref 26.0–34.0)
MCHC: 33.4 g/dL (ref 30.0–36.0)
MCV: 78.8 fL — ABNORMAL LOW (ref 80.0–100.0)
Monocytes Absolute: 0.6 10*3/uL (ref 0.1–1.0)
Monocytes Relative: 9 %
Neutro Abs: 4.1 10*3/uL (ref 1.7–7.7)
Neutrophils Relative %: 56 %
Platelet Count: 410 10*3/uL — ABNORMAL HIGH (ref 150–400)
RBC: 6.84 MIL/uL — ABNORMAL HIGH (ref 4.22–5.81)
RDW: 16.5 % — ABNORMAL HIGH (ref 11.5–15.5)
WBC Count: 7.2 10*3/uL (ref 4.0–10.5)
nRBC: 0 % (ref 0.0–0.2)

## 2019-11-23 LAB — TSH: TSH: 3.078 u[IU]/mL (ref 0.320–4.118)

## 2019-11-23 MED ORDER — SODIUM CHLORIDE 0.9 % IV SOLN
INTRAVENOUS | Status: AC
  Filled 2019-11-23 (×2): qty 250

## 2019-11-23 MED ORDER — MAGIC MOUTHWASH: 5.0000 mL | Freq: Four times a day (QID) | ORAL | 0 refills | Status: AC | PRN

## 2019-11-23 MED ORDER — DIPHENOXYLATE-ATROPINE 2.5-0.025 MG PO TABS
ORAL_TABLET | ORAL | Status: AC
  Filled 2019-11-23: qty 1

## 2019-11-23 MED ORDER — DIPHENOXYLATE-ATROPINE 2.5-0.025 MG PO TABS
1.0000 | ORAL_TABLET | Freq: Once | ORAL | Status: AC
  Administered 2019-11-23: 1 via ORAL

## 2019-11-23 NOTE — Patient Instructions (Addendum)
Diarrhea, Adult Diarrhea is when you pass loose and watery poop (stool) often. Diarrhea can make you feel weak and cause you to lose water in your body (get dehydrated). Losing water in your body can cause you to:  Feel tired and thirsty.  Have a dry mouth.  Go pee (urinate) less often. Diarrhea often lasts 2-3 days. However, it can last longer if it is a sign of something more serious. It is important to treat your diarrhea as told by your doctor. Follow these instructions at home:  Stop your Reglan Eating and drinking     Follow these instructions as told by your doctor:  Take an ORS (oral rehydration solution). This is a drink that helps you replace fluids and minerals your body lost. It is sold at pharmacies and stores.  Drink plenty of fluids, such as: ? Water. ? Ice chips. ? Diluted fruit juice. ? Low-calorie sports drinks. ? Milk, if you want.  Avoid drinking fluids that have a lot of sugar or caffeine in them.  Eat bland, easy-to-digest foods in small amounts as you are able. These foods include: ? Bananas. ? Applesauce. ? Rice. ? Low-fat (lean) meats. ? Toast. ? Crackers.  Avoid alcohol.  Avoid spicy or fatty foods.  Medicines  Take over-the-counter and prescription medicines only as told by your doctor. Start Imodium 2-4 mg four times daily as needed.   If you were prescribed an antibiotic medicine, take it as told by your doctor. Do not stop using the antibiotic even if you start to feel better. General instructions   Wash your hands often using soap and water. If soap and water are not available, use a hand sanitizer. Others in your home should wash their hands as well. Hands should be washed: ? After using the toilet or changing a diaper. ? Before preparing, cooking, or serving food. ? While caring for a sick person. ? While visiting someone in a hospital.  Drink enough fluid to keep your pee (urine) pale yellow.  Rest at home while you get  better.  Watch your condition for any changes.  Take a warm bath to help with any burning or pain from having diarrhea.  Keep all follow-up visits as told by your doctor. This is important. Contact a doctor if:  You have a fever.  Your diarrhea gets worse.  You have new symptoms.  You cannot keep fluids down.  You feel light-headed or dizzy.  You have a headache.  You have muscle cramps. Get help right away if:  You have chest pain.  You feel very weak or you pass out (faint).  You have bloody or black poop or poop that looks like tar.  You have very bad pain, cramping, or bloating in your belly (abdomen).  You have trouble breathing or you are breathing very quickly.  Your heart is beating very quickly.  Your skin feels cold and clammy.  You feel confused.  You have signs of losing too much water in your body, such as: ? Dark pee, very little pee, or no pee. ? Cracked lips. ? Dry mouth. ? Sunken eyes. ? Sleepiness. ? Weakness. Summary  Diarrhea is when you pass loose and watery poop (stool) often.  Diarrhea can make you feel weak and cause you to lose water in your body (get dehydrated).  Take an ORS (oral rehydration solution). This is a drink that is sold at pharmacies and stores.  Eat bland, easy-to-digest foods in small amounts as you  are able.  Contact a doctor if your condition gets worse. Get help right away if you have signs that you have lost too much water in your body. This information is not intended to replace advice given to you by your health care provider. Make sure you discuss any questions you have with your health care provider. Document Revised: 07/26/2017 Document Reviewed: 07/26/2017 Elsevier Patient Education  2020 [employer] up your script for magic mouthwash: Take 5-10 ml swish and swallow four times daily as needed

## 2019-11-23 NOTE — Progress Notes (Signed)
[place] OFFICE PROGRESS NOTE   Diagnosis: Hepatocellular carcinoma  INTERVAL HISTORY:   [name] returns as scheduled.  He began lenvatinib on 11/10/2019.  He reports developing diarrhea approximately 2 days later.  He has 5 or 6 loose stools per day.  No bleeding.  No nausea.  He is not taking antidiarrhea medication.  He has been using metoclopramide, but this has not helped.  He complains of pain at the right upper arm, right anterior chest, and right shoulder with movement.  No pain at rest.  No dyspnea.  He has intermittent "cramps "in the calves. He has a sore mouth. Objective:  Vital signs in last 24 hours:  Blood pressure (!) 139/91, pulse (!) 116, temperature 97.8 F (36.6 C), temperature source Tympanic, resp. rate 18, height 5\' 4"  (1.626 m), weight 194 lb 4.8 oz (88.1 kg), SpO2 100 %.    HEENT: Ulcerations at the anterior buccal mucosa bilaterally, no thrush Resp: Lungs clear bilaterally Cardio: Regular rate and rhythm GI: No hepatosplenomegaly, nontender Vascular: No leg edema, calves without erythema or tenderness Musculoskeletal: Pain with motion of the right shoulder and palpation at the right upper arm.  No mass.  The right axilla appears benign. Skin: Hyperpigmented lesions over the trunk    Lab Results:  Lab Results  Component Value Date   WBC 7.2 11/23/2019   HGB 18.0 (H) 11/23/2019   HCT 53.9 (H) 11/23/2019   MCV 78.8 (L) 11/23/2019   PLT 410 (H) 11/23/2019   NEUTROABS 4.1 11/23/2019    CMP  Lab Results  Component Value Date   NA 134 (L) 10/30/2019   K 4.0 10/30/2019   CL 103 10/30/2019   CO2 21 (L) 10/30/2019   GLUCOSE 272 (H) 10/30/2019   BUN 26 (H) 10/30/2019   CREATININE 1.25 (H) 10/30/2019   CALCIUM 9.5 10/30/2019   PROT 7.6 10/30/2019   ALBUMIN 3.6 10/30/2019   AST 79 (H) 10/30/2019   ALT 100 (H) 10/30/2019   ALKPHOS 155 (H) 10/30/2019   BILITOT 0.8 10/30/2019   GFRNONAA 59 (L) 10/30/2019   GFRAA >60 10/30/2019       Medications: I have reviewed the patient's current medications.   Assessment/Plan: 1. Hepatocellular carcinoma  02/12/2019 AFP 10,200  02/18/2019 CT abdomen-dominant poorly marginated heterogeneously enhancing 8.6 x 7.7 cm posterior right liver lobe mass, new compared to CT study 09/10/2016. Numerous similar-appearing liver masses scattered throughout the liver all new since 09/10/2016 CT. Diffusely irregular liver surface compatible with cirrhosis. Spleen normal size. No ascites. No abdominal adenopathy.  MRI liver 03/05/2019-multifocal hepatic metastases including a 10 cm lesion in the right liver,Li-rads 5, no evidence of metastatic disease  Cycle 1 atezolizumab/bevacizumab 03/20/2019  Cycle 2 atezolizumab/bevacizumab 04/24/2019  Cycle 3 atezolizumab/bevacizumab 05/15/2019  Cycle 4 atezolizumab/bevacizumab 06/05/2019  MRI liver 06/24/2019-multifocal hepatocellular carcinoma, lesions stable to decreased in size. No evidence for disease progression. Cirrhosis.  Cycle 5 atezolizumab/bevacizumab 06/25/2019  Cycle 6 atezolizumab/bevacizumab 07/17/2019  Cycle 7 atezolizumab/bevacizumab 08/07/2019  Cycle 8 atezolizumab/bevacizumab 08/28/2019  Cycle 9 atezolizumab/bevacizumab 09/18/2019  Cycle 10 atezolizumab/bevacizumab 10/09/2019  MRI liver 10/27/2019-enlargement of multifocal liver lesions, cirrhosis  Lenvatinib beginning 11/10/2019, held starting 11/23/2019 secondary to diarrhea and mucositis 2. 01/20/2019 chest CT-no definite evidence of pulmonary embolus. Wall thickening of the distal esophagus. Nodular hepatic contours. 3. Cirrhosis  4. Hepatitis C status post Harvoni 5. Type 2 diabetes 6. COPD 7. CAD 8. Chronic diastolic heart failure 9. Hypertension 10. Positive Cologuard 2019 11. Diarrhea and mucositis 11/23/2019 12.  Dehydration secondary to diarrhea 11/23/2019    Disposition: [name] has hepatocellular carcinoma and cirrhosis.  He started lenvatinib 11/10/2019.  He  has developed diarrhea, mucositis, and he appears dehydrated.  Lenvatinib will be placed on hold.  I recommended he discontinue metoclopramide.  He will begin Imodium for diarrhea.  Mr. Lemelle will receive intravenous fluids today.  He will begin Magic mouthwash.  He will return for an office visit and IV fluids tomorrow.  The pain of the right shoulder appears to be related to a musculoskeletal condition.  I have a low suspicion for venous thrombosis.  He will be referred for a plain x-ray today.  Betsy Coder, MD  11/23/2019  10:58 AM

## 2019-11-23 NOTE — Progress Notes (Signed)
Reports significant diarrhea of 5-6/day since ~ 11/13/19--has not taken any meds or called office. Mouth and throat are painful. Blood sugar at home has been in the 60's a few times. Has developed pain in his right hand that travels up his arm into shoulder and right chest wall. Denies any nausea or shortness of breath. Moving his arm toward ceiling worsens the pain.

## 2019-11-23 NOTE — Telephone Encounter (Signed)
Scheduled appointment per 9/20 los. Spoke with patient who is aware of appointment.

## 2019-11-24 ENCOUNTER — Inpatient Hospital Stay: Payer: Medicare HMO

## 2019-11-24 ENCOUNTER — Other Ambulatory Visit: Payer: Medicare HMO

## 2019-11-24 ENCOUNTER — Other Ambulatory Visit: Payer: Self-pay

## 2019-11-24 ENCOUNTER — Telehealth: Payer: Self-pay | Admitting: *Deleted

## 2019-11-24 ENCOUNTER — Inpatient Hospital Stay (HOSPITAL_BASED_OUTPATIENT_CLINIC_OR_DEPARTMENT_OTHER): Payer: Medicare HMO | Admitting: Nurse Practitioner

## 2019-11-24 ENCOUNTER — Encounter: Payer: Self-pay | Admitting: Nurse Practitioner

## 2019-11-24 VITALS — BP 143/72 | HR 80 | Temp 98.1°F | Resp 17 | Ht 64.0 in | Wt 195.5 lb

## 2019-11-24 DIAGNOSIS — C220 Liver cell carcinoma: Secondary | ICD-10-CM

## 2019-11-24 DIAGNOSIS — I110 Hypertensive heart disease with heart failure: Secondary | ICD-10-CM | POA: Diagnosis not present

## 2019-11-24 DIAGNOSIS — E119 Type 2 diabetes mellitus without complications: Secondary | ICD-10-CM | POA: Diagnosis not present

## 2019-11-24 DIAGNOSIS — K7460 Unspecified cirrhosis of liver: Secondary | ICD-10-CM | POA: Diagnosis not present

## 2019-11-24 DIAGNOSIS — I2510 Atherosclerotic heart disease of native coronary artery without angina pectoris: Secondary | ICD-10-CM | POA: Diagnosis not present

## 2019-11-24 DIAGNOSIS — M25511 Pain in right shoulder: Secondary | ICD-10-CM | POA: Diagnosis not present

## 2019-11-24 DIAGNOSIS — B1920 Unspecified viral hepatitis C without hepatic coma: Secondary | ICD-10-CM | POA: Diagnosis not present

## 2019-11-24 DIAGNOSIS — J449 Chronic obstructive pulmonary disease, unspecified: Secondary | ICD-10-CM | POA: Diagnosis not present

## 2019-11-24 DIAGNOSIS — I5032 Chronic diastolic (congestive) heart failure: Secondary | ICD-10-CM | POA: Diagnosis not present

## 2019-11-24 LAB — CMP (CANCER CENTER ONLY)
ALT: 89 U/L — ABNORMAL HIGH (ref 0–44)
AST: 77 U/L — ABNORMAL HIGH (ref 15–41)
Albumin: 3.3 g/dL — ABNORMAL LOW (ref 3.5–5.0)
Alkaline Phosphatase: 150 U/L — ABNORMAL HIGH (ref 38–126)
Anion gap: 9 (ref 5–15)
BUN: 17 mg/dL (ref 8–23)
CO2: 20 mmol/L — ABNORMAL LOW (ref 22–32)
Calcium: 8.5 mg/dL — ABNORMAL LOW (ref 8.9–10.3)
Chloride: 104 mmol/L (ref 98–111)
Creatinine: 1.16 mg/dL (ref 0.61–1.24)
GFR, Est AFR Am: >60 mL/min (ref >60)
GFR, Estimated: >60 mL/min (ref >60)
Glucose, Bld: 180 mg/dL — ABNORMAL HIGH (ref 70–99)
Potassium: 4.3 mmol/L (ref 3.5–5.1)
Sodium: 133 mmol/L — ABNORMAL LOW (ref 135–145)
Total Bilirubin: 0.9 mg/dL (ref 0.3–1.2)
Total Protein: 7.4 g/dL (ref 6.5–8.1)

## 2019-11-24 LAB — CBC WITH DIFFERENTIAL (CANCER CENTER ONLY)
Abs Immature Granulocytes: 0.02 10*3/uL (ref 0.00–0.07)
Basophils Absolute: 0.1 10*3/uL (ref 0.0–0.1)
Basophils Relative: 1 %
Eosinophils Absolute: 0.1 10*3/uL (ref 0.0–0.5)
Eosinophils Relative: 1 %
HCT: 47.2 % (ref 39.0–52.0)
Hemoglobin: 15.9 g/dL (ref 13.0–17.0)
Immature Granulocytes: 0 %
Lymphocytes Relative: 29 %
Lymphs Abs: 2 10*3/uL (ref 0.7–4.0)
MCH: 27 pg (ref 26.0–34.0)
MCHC: 33.7 g/dL (ref 30.0–36.0)
MCV: 80.1 fL (ref 80.0–100.0)
Monocytes Absolute: 0.5 10*3/uL (ref 0.1–1.0)
Monocytes Relative: 8 %
Neutro Abs: 4.2 10*3/uL (ref 1.7–7.7)
Neutrophils Relative %: 61 %
Platelet Count: 393 10*3/uL (ref 150–400)
RBC: 5.89 MIL/uL — ABNORMAL HIGH (ref 4.22–5.81)
RDW: 14.6 % (ref 11.5–15.5)
WBC Count: 6.9 10*3/uL (ref 4.0–10.5)
nRBC: 0 % (ref 0.0–0.2)

## 2019-11-24 LAB — MAGNESIUM: Magnesium: 1.8 mg/dL (ref 1.7–2.4)

## 2019-11-24 MED ORDER — TRAMADOL HCL 50 MG PO TABS: 50.0000 mg | ORAL_TABLET | Freq: Two times a day (BID) | ORAL | 0 refills | Status: DC | PRN

## 2019-11-24 MED ORDER — SODIUM CHLORIDE 0.9 % IV SOLN
INTRAVENOUS | Status: DC
  Filled 2019-11-24 (×2): qty 250

## 2019-11-24 NOTE — Telephone Encounter (Signed)
Called to report the cost for the MMW was $222 and he can't afford it. Also asking how much of the imodium he can take/day? Using liquid form of 1 mg/ 7.33ml.  This RN called pharmacy and was told the MMW cost is $20.62. Informed patient of this and that he can take 15 ml qid. He will go to pharmacy and pick up MMW script now. He reports his BP at home last night was 165/87.

## 2019-11-24 NOTE — Patient Instructions (Signed)
Dehydration, Adult Dehydration is a condition in which there is not enough water or other fluids in the body. This happens when a person loses more fluids than he or she takes in. Important organs, such as the kidneys, brain, and heart, cannot function without a proper amount of fluids. Any loss of fluids from the body can lead to dehydration. Dehydration can be mild, moderate, or severe. It should be treated right away to prevent it from becoming severe. What are the causes? Dehydration may be caused by:  Conditions that cause loss of water or other fluids, such as diarrhea, vomiting, or sweating or urinating a lot.  Not drinking enough fluids, especially when you are ill or doing activities that require a lot of energy.  Other illnesses and conditions, such as fever or infection.  Certain medicines, such as medicines that remove excess fluid from the body (diuretics).  Lack of safe drinking water.  Not being able to get enough water and food. What increases the risk? The following factors may make you more likely to develop this condition:  Having a long-term (chronic) illness that has not been treated properly, such as diabetes, heart disease, or kidney disease.  Being 65 years of age or older.  Having a disability.  Living in a place that is high in altitude, where thinner, drier air causes more fluid loss.  Doing exercises that put stress on your body for a long time (endurance sports). What are the signs or symptoms? Symptoms of dehydration depend on how severe it is. Mild or moderate dehydration  Thirst.  Dry lips or dry mouth.  Dizziness or light-headedness, especially when standing up from a seated position.  Muscle cramps.  Dark urine. Urine may be the color of tea.  Less urine or tears produced than usual.  Headache. Severe dehydration  Changes in skin. Your skin may be cold and clammy, blotchy, or pale. Your skin also may not return to normal after being  lightly pinched and released.  Little or no tears, urine, or sweat.  Changes in vital signs, such as rapid breathing and low blood pressure. Your pulse may be weak or may be faster than 100 beats a minute when you are sitting still.  Other changes, such as: ? Feeling very thirsty. ? Sunken eyes. ? Cold hands and feet. ? Confusion. ? Being very tired (lethargic) or having trouble waking from sleep. ? Short-term weight loss. ? Loss of consciousness. How is this diagnosed? This condition is diagnosed based on your symptoms and a physical exam. You may have blood and urine tests to help confirm the diagnosis. How is this treated? Treatment for this condition depends on how severe it is. Treatment should be started right away. Do not wait until dehydration becomes severe. Severe dehydration is an emergency and needs to be treated in a hospital.  Mild or moderate dehydration can be treated at home. You may be asked to: ? Drink more fluids. ? Drink an oral rehydration solution (ORS). This drink helps restore proper amounts of fluids and salts and minerals in the blood (electrolytes).  Severe dehydration can be treated: ? With IV fluids. ? By correcting abnormal levels of electrolytes. This is often done by giving electrolytes through a tube that is passed through your nose and into your stomach (nasogastric tube, or NG tube). ? By treating the underlying cause of dehydration. Follow these instructions at home: Oral rehydration solution If told by your health care provider, drink an ORS:  Make   an ORS by following instructions on the package.  Start by drinking small amounts, about  cup (120 mL) every 5-10 minutes.  Slowly increase how much you drink until you have taken the amount recommended by your health care provider. Eating and drinking         Drink enough clear fluid to keep your urine pale yellow. If you were told to drink an ORS, finish the ORS first and then start slowly  drinking other clear fluids. Drink fluids such as: ? Water. Do not drink only water. Doing that can lead to hyponatremia, which is having too little salt (sodium) in the body. ? Water from ice chips you suck on. ? Fruit juice that you have added water to (diluted fruit juice). ? Low-calorie sports drinks.  Eat foods that contain a healthy balance of electrolytes, such as bananas, oranges, potatoes, tomatoes, and spinach.  Do not drink alcohol.  Avoid the following: ? Drinks that contain a lot of sugar. These include high-calorie sports drinks, fruit juice that is not diluted, and soda. ? Caffeine. ? Foods that are greasy or contain a lot of fat or sugar. General instructions  Take over-the-counter and prescription medicines only as told by your health care provider.  Do not take sodium tablets. Doing that can lead to having too much sodium in the body (hypernatremia).  Return to your normal activities as told by your health care provider. Ask your health care provider what activities are safe for you.  Keep all follow-up visits as told by your health care provider. This is important. Contact a health care provider if:  You have muscle cramps, pain, or discomfort, such as: ? Pain in your abdomen and the pain gets worse or stays in one area (localizes). ? Stiff neck.  You have a rash.  You are more irritable than usual.  You are sleepier or have a harder time waking than usual.  You feel weak or dizzy.  You feel very thirsty. Get help right away if you have:  Any symptoms of severe dehydration.  Symptoms of vomiting, such as: ? You cannot eat or drink without vomiting. ? Vomiting gets worse or does not go away. ? Vomit includes blood or green matter (bile).  Symptoms that get worse with treatment.  A fever.  A severe headache.  Problems with urination or bowel movements, such as: ? Diarrhea that gets worse or does not go away. ? Blood in your stool (feces). This  may cause stool to look black and tarry. ? Not urinating, or urinating only a small amount of very dark urine, within 6-8 hours.  Trouble breathing. These symptoms may represent a serious problem that is an emergency. Do not wait to see if the symptoms will go away. Get medical help right away. Call your local emergency services (911 in the [place] Do not drive yourself to the hospital. Summary  Dehydration is a condition in which there is not enough water or other fluids in the body. This happens when a person loses more fluids than he or she takes in.  Treatment for this condition depends on how severe it is. Treatment should be started right away. Do not wait until dehydration becomes severe.  Drink enough clear fluid to keep your urine pale yellow. If you were told to drink an oral rehydration solution (ORS), finish the ORS first and then start slowly drinking other clear fluids.  Take over-the-counter and prescription medicines only as told by your health care   provider.  Get help right away if you have any symptoms of severe dehydration. This information is not intended to replace advice given to you by your health care provider. Make sure you discuss any questions you have with your health care provider. Document Revised: 10/02/2018 Document Reviewed: 10/02/2018 Elsevier Patient Education  2020 [employer]   

## 2019-11-24 NOTE — Progress Notes (Addendum)
[place] OFFICE PROGRESS NOTE   Diagnosis: Hepatocellular carcinoma  INTERVAL HISTORY:   [name] returns as scheduled.  He was seen in the office yesterday.  He began lenvatinib on 11/10/2019.  He subsequently developed diarrhea, mucositis.  He appears dehydrated.  Lenvatinib was placed on hold.  He was instructed to begin Imodium.  He received IV fluids.  He reports feeling much better.  He began Imodium yesterday.  He has had one loose stool since then.  Mouth continues to be sore.  He is able to swallow fluids.  He denies nausea/vomiting.  No rash.  He continues to have significant pain at the right upper arm/shoulder and at times extending across the right chest.  He took Naprosyn yesterday with some improvement.  Objective:  Vital signs in last 24 hours:  Blood pressure (!) 143/72, pulse 80, temperature 98.1 F (36.7 C), temperature source Tympanic, resp. rate 17, height 5\' 4"  (1.626 m), weight 195 lb 8 oz (88.7 kg), SpO2 100 %.    HEENT: Ulcerations of the anterior buccal mucosa bilaterally.  No thrush. Resp: Lungs clear bilaterally. Cardio: Regular rate and rhythm. GI: Abdomen soft and nontender.  No hepatosplenomegaly. Vascular: No leg edema. Neuro: Alert and oriented.    Lab Results:  Lab Results  Component Value Date   WBC 6.9 11/24/2019   HGB 15.9 11/24/2019   HCT 47.2 11/24/2019   MCV 80.1 11/24/2019   PLT 393 11/24/2019   NEUTROABS 4.2 11/24/2019    Imaging:  DG Shoulder 1V Right  Result Date: 11/23/2019 CLINICAL DATA:  Parasellar carcinoma, severe pain with movement of the right shoulder EXAM: RIGHT SHOULDER - 1 VIEW COMPARISON:  Chest x-ray 05/26/2008. x-ray left humerus 11/23/2019 FINDINGS: There is no evidence of fracture or dislocation. Suggestion of an irregularity of the inferior glenoid. Soft tissues are unremarkable. IMPRESSION: 1. Indeterminate slight irregularity of the inferior glenoid. 2. Otherwise no acute fracture or  dislocation of the right shoulder. Please note limited evaluation of the shoulder on this single view. Electronically Signed   By: Iven Finn M.D.   On: 11/23/2019 23:17   DG Humerus Right  Result Date: 11/23/2019 CLINICAL DATA:  Right arm pain for 1 month, no known injury, initial encounter EXAM: RIGHT HUMERUS - 2+ VIEW COMPARISON:  None. FINDINGS: Mild degenerative changes are noted in the acromioclavicular joint as well as a thinning humeral joint. No acute fracture or dislocation is seen. No lytic or sclerotic lesion is noted. IMPRESSION: Mild degenerative change without acute abnormality. Electronically Signed   By: Inez Catalina M.D.   On: 11/23/2019 23:15    Medications: I have reviewed the patient's current medications.  Assessment/Plan: 1. Hepatocellular carcinoma  02/12/2019 AFP 10,200  02/18/2019 CT abdomen-dominant poorly marginated heterogeneously enhancing 8.6 x 7.7 cm posterior right liver lobe mass, new compared to CT study 09/10/2016. Numerous similar-appearing liver masses scattered throughout the liver all new since 09/10/2016 CT. Diffusely irregular liver surface compatible with cirrhosis. Spleen normal size. No ascites. No abdominal adenopathy.  MRI liver 03/05/2019-multifocal hepatic metastases including a 10 cm lesion in the right liver,Li-rads 5, no evidence of metastatic disease  Cycle 1 atezolizumab/bevacizumab 03/20/2019  Cycle 2 atezolizumab/bevacizumab 04/24/2019  Cycle 3 atezolizumab/bevacizumab 05/15/2019  Cycle 4 atezolizumab/bevacizumab 06/05/2019  MRI liver 06/24/2019-multifocal hepatocellular carcinoma, lesions stable to decreased in size. No evidence for disease progression. Cirrhosis.  Cycle 5 atezolizumab/bevacizumab 06/25/2019  Cycle 6 atezolizumab/bevacizumab 07/17/2019  Cycle 7 atezolizumab/bevacizumab 08/07/2019  Cycle 8 atezolizumab/bevacizumab 08/28/2019  Cycle 9atezolizumab/bevacizumab  09/18/2019  Cycle 10 atezolizumab/bevacizumab  10/09/2019  MRI liver 10/27/2019-enlargement of multifocal liver lesions, cirrhosis  Lenvatinib beginning 11/10/2019, held starting 11/23/2019 secondary to diarrhea and mucositis 2. 01/20/2019 chest CT-no definite evidence of pulmonary embolus. Wall thickening of the distal esophagus. Nodular hepatic contours. 3. Cirrhosis  4. Hepatitis C status post Harvoni 5. Type 2 diabetes 6. COPD 7. CAD 8. Chronic diastolic heart failure 9. Hypertension 10. Positive Cologuard 2019 11. Diarrhea and mucositis 11/23/2019 12. Dehydration secondary to diarrhea 11/23/2019 13. Right arm/shoulder pain-plain x-ray right shoulder with slight irregularity inferior glenoid, no fracture or dislocation; right humerus with mild degenerative changes in the acromioclavicular joint, thinning humeral joint, no acute fracture or dislocation and no lytic or sclerotic lesion noted.  Disposition: [name] appears improved.  The diarrhea has responded to Imodium.  He notes improved tolerance of fluids by mouth.  He will continue to hold lenvatinib.  We will reevaluate early next week with the plan to resume lenvatinib at a reduced dose of 8 mg daily if clinical status continues to improve.  He will receive a liter of IV fluids today and continue to push fluids by mouth.  We reviewed the CBC and chemistry panel from today.  Kidney function and transaminases are better.  The right upper arm/shoulder pain is some better today.  We instructed him to discontinue Naprosyn due to renal dysfunction.  He will try Tylenol.  We are sending a prescription to his pharmacy for tramadol if Tylenol is not effective.  He understands he should not be driving while taking tramadol.  He will return for lab and follow-up on 11/30/2019.  He will contact the office in the interim with any problems.  Patient seen with Dr. Benay Spice.      Ned Card ANP/GNP-BC   11/24/2019  8:40 AM This was a shared visit with Ned Card.  [name] was interviewed  and examined.  The diarrhea has improved.  Lenvatinib will remain on hold.  We will plan to restart lenvatinib with a dose reduction after an office visit next week.  We will refer him for an orthopedic evaluation and CT shoulder if he has persistent pain.  Julieanne Manson, MD

## 2019-11-25 ENCOUNTER — Telehealth: Payer: Self-pay | Admitting: Oncology

## 2019-11-25 NOTE — Telephone Encounter (Signed)
Scheduled appointment per 9/21 los. Called patient, no answer. Left message for patient with appointment date and time.

## 2019-11-30 ENCOUNTER — Telehealth: Payer: Self-pay | Admitting: Nurse Practitioner

## 2019-11-30 ENCOUNTER — Other Ambulatory Visit: Payer: Self-pay

## 2019-11-30 ENCOUNTER — Inpatient Hospital Stay (HOSPITAL_BASED_OUTPATIENT_CLINIC_OR_DEPARTMENT_OTHER): Payer: Medicare HMO | Admitting: Nurse Practitioner

## 2019-11-30 ENCOUNTER — Inpatient Hospital Stay: Payer: Medicare HMO

## 2019-11-30 ENCOUNTER — Encounter: Payer: Self-pay | Admitting: Nurse Practitioner

## 2019-11-30 VITALS — BP 136/77 | HR 96 | Temp 97.7°F | Resp 17 | Ht 64.0 in | Wt 194.1 lb

## 2019-11-30 DIAGNOSIS — M25511 Pain in right shoulder: Secondary | ICD-10-CM | POA: Diagnosis not present

## 2019-11-30 DIAGNOSIS — K7460 Unspecified cirrhosis of liver: Secondary | ICD-10-CM | POA: Diagnosis not present

## 2019-11-30 DIAGNOSIS — B1920 Unspecified viral hepatitis C without hepatic coma: Secondary | ICD-10-CM | POA: Diagnosis not present

## 2019-11-30 DIAGNOSIS — I2510 Atherosclerotic heart disease of native coronary artery without angina pectoris: Secondary | ICD-10-CM | POA: Diagnosis not present

## 2019-11-30 DIAGNOSIS — E119 Type 2 diabetes mellitus without complications: Secondary | ICD-10-CM | POA: Diagnosis not present

## 2019-11-30 DIAGNOSIS — C220 Liver cell carcinoma: Secondary | ICD-10-CM

## 2019-11-30 DIAGNOSIS — J449 Chronic obstructive pulmonary disease, unspecified: Secondary | ICD-10-CM | POA: Diagnosis not present

## 2019-11-30 DIAGNOSIS — I110 Hypertensive heart disease with heart failure: Secondary | ICD-10-CM | POA: Diagnosis not present

## 2019-11-30 DIAGNOSIS — I5032 Chronic diastolic (congestive) heart failure: Secondary | ICD-10-CM | POA: Diagnosis not present

## 2019-11-30 LAB — CBC WITH DIFFERENTIAL (CANCER CENTER ONLY)
Abs Immature Granulocytes: 0.01 10*3/uL (ref 0.00–0.07)
Basophils Absolute: 0.1 10*3/uL (ref 0.0–0.1)
Basophils Relative: 1 %
Eosinophils Absolute: 0.2 10*3/uL (ref 0.0–0.5)
Eosinophils Relative: 3 %
HCT: 49.3 % (ref 39.0–52.0)
Hemoglobin: 16.4 g/dL (ref 13.0–17.0)
Immature Granulocytes: 0 %
Lymphocytes Relative: 30 %
Lymphs Abs: 2.1 10*3/uL (ref 0.7–4.0)
MCH: 27 pg (ref 26.0–34.0)
MCHC: 33.3 g/dL (ref 30.0–36.0)
MCV: 81.2 fL (ref 80.0–100.0)
Monocytes Absolute: 0.9 10*3/uL (ref 0.1–1.0)
Monocytes Relative: 12 %
Neutro Abs: 4 10*3/uL (ref 1.7–7.7)
Neutrophils Relative %: 54 %
Platelet Count: 367 10*3/uL (ref 150–400)
RBC: 6.07 MIL/uL — ABNORMAL HIGH (ref 4.22–5.81)
RDW: 15.3 % (ref 11.5–15.5)
WBC Count: 7.2 10*3/uL (ref 4.0–10.5)
nRBC: 0 % (ref 0.0–0.2)

## 2019-11-30 LAB — CMP (CANCER CENTER ONLY)
ALT: 99 U/L — ABNORMAL HIGH (ref 0–44)
AST: 104 U/L — ABNORMAL HIGH (ref 15–41)
Albumin: 3.4 g/dL — ABNORMAL LOW (ref 3.5–5.0)
Alkaline Phosphatase: 153 U/L — ABNORMAL HIGH (ref 38–126)
Anion gap: 8 (ref 5–15)
BUN: 16 mg/dL (ref 8–23)
CO2: 27 mmol/L (ref 22–32)
Calcium: 9.3 mg/dL (ref 8.9–10.3)
Chloride: 100 mmol/L (ref 98–111)
Creatinine: 1.16 mg/dL (ref 0.61–1.24)
GFR, Est AFR Am: >60 mL/min (ref >60)
GFR, Estimated: >60 mL/min (ref >60)
Glucose, Bld: 117 mg/dL — ABNORMAL HIGH (ref 70–99)
Potassium: 4.2 mmol/L (ref 3.5–5.1)
Sodium: 135 mmol/L (ref 135–145)
Total Bilirubin: 1.5 mg/dL — ABNORMAL HIGH (ref 0.3–1.2)
Total Protein: 7.9 g/dL (ref 6.5–8.1)

## 2019-11-30 LAB — TOTAL PROTEIN, URINE DIPSTICK: Protein, ur: NEGATIVE mg/dL

## 2019-11-30 NOTE — Telephone Encounter (Signed)
Scheduled per 9/27 los. Printed avs and calendar for pt.

## 2019-11-30 NOTE — Progress Notes (Signed)
  [place] OFFICE PROGRESS NOTE   Diagnosis: Hepatocellular carcinoma  INTERVAL HISTORY:   Mr. Franca returns as scheduled.  He continues to feel better.  He denies nausea.  He is no longer having diarrhea.  Mouth is mildly sore.  He is swallowing without difficulty.  Appetite is better.  He is no longer having shoulder pain.  Objective:  Vital signs in last 24 hours:  Blood pressure 136/77, pulse 96, temperature 97.7 F (36.5 C), temperature source Tympanic, resp. rate 17, height 5\' 4"  (1.626 m), weight 194 lb 1.6 oz (88 kg), SpO2 99 %.    HEENT: Superficial ulceration, appears to be healing, anterior buccal regions bilaterally. Resp: Lungs clear bilaterally. Cardio: Regular rate and rhythm. GI: Abdomen soft and nontender.  No hepatosplenomegaly. Vascular: No leg edema. Neuro: Alert and oriented. Skin: Palms without erythema.   Lab Results:  Lab Results  Component Value Date   WBC 7.2 11/30/2019   HGB 16.4 11/30/2019   HCT 49.3 11/30/2019   MCV 81.2 11/30/2019   PLT 367 11/30/2019   NEUTROABS 4.0 11/30/2019    Imaging:  No results found.  Medications: I have reviewed the patient's current medications.  Assessment/Plan: 1. Hepatocellular carcinoma  02/12/2019 AFP 10,200  02/18/2019 CT abdomen-dominant poorly marginated heterogeneously enhancing 8.6 x 7.7 cm posterior right liver lobe mass, new compared to CT study 09/10/2016. Numerous similar-appearing liver masses scattered throughout the liver all new since 09/10/2016 CT. Diffusely irregular liver surface compatible with cirrhosis. Spleen normal size. No ascites. No abdominal adenopathy.  MRI liver 03/05/2019-multifocal hepatic metastases including a 10 cm lesion in the right liver,Li-rads 5, no evidence of metastatic disease  Cycle 1 atezolizumab/bevacizumab 03/20/2019  Cycle 2 atezolizumab/bevacizumab 04/24/2019  Cycle 3 atezolizumab/bevacizumab 05/15/2019  Cycle 4 atezolizumab/bevacizumab  06/05/2019  MRI liver 06/24/2019-multifocal hepatocellular carcinoma, lesions stable to decreased in size. No evidence for disease progression. Cirrhosis.  Cycle 5 atezolizumab/bevacizumab 06/25/2019  Cycle 6 atezolizumab/bevacizumab 07/17/2019  Cycle 7 atezolizumab/bevacizumab 08/07/2019  Cycle 8 atezolizumab/bevacizumab 08/28/2019  Cycle 9atezolizumab/bevacizumab 09/18/2019  Cycle 10 atezolizumab/bevacizumab 10/09/2019  MRI liver 10/27/2019-enlargement of multifocal liver lesions, cirrhosis  Lenvatinib beginning 11/10/2019, held starting 11/23/2019 secondary to diarrhea and mucositis  Lenvatinib resumed at 4 mg daily beginning 11/30/2019 2. 01/20/2019 chest CT-no definite evidence of pulmonary embolus. Wall thickening of the distal esophagus. Nodular hepatic contours. 3. Cirrhosis  4. Hepatitis C status post Harvoni 5. Type 2 diabetes 6. COPD 7. CAD 8. Chronic diastolic heart failure 9. Hypertension 10. Positive Cologuard 2019 11. Diarrhea and mucositis 11/23/2019 12. Dehydration secondary to diarrhea 11/23/2019 13. Right arm/shoulder pain-plain x-ray right shoulder with slight irregularity inferior glenoid, no fracture or dislocation; right humerus with mild degenerative changes in the acromioclavicular joint, thinning humeral joint, no acute fracture or dislocation and no lytic or sclerotic lesion noted.  Pain resolved 11/30/2019.   Disposition: Mr. Nimmons appears improved.  Plan to resume lenvatinib at a reduced dose of 4 mg daily.  He understands to contact the office if he develops recurrent diarrhea, mouth sores.  We reviewed the labs from today.  Labs adequate to resume lenvatinib.    He will return for lab and follow-up in 1 week.  He will contact the office in the interim as outlined above or with any other problems.  Plan reviewed with Dr. Benay Spice.    Ned Card ANP/GNP-BC   11/30/2019  9:10 AM

## 2019-12-03 ENCOUNTER — Telehealth: Payer: Self-pay | Admitting: *Deleted

## 2019-12-03 ENCOUNTER — Other Ambulatory Visit: Payer: Self-pay | Admitting: Oncology

## 2019-12-03 DIAGNOSIS — C220 Liver cell carcinoma: Secondary | ICD-10-CM

## 2019-12-03 NOTE — Telephone Encounter (Signed)
Called to report his right arm and shoulder pain has returned and the bid Tramadol is not helping him. Per Dr. Benay Spice: Needs CT RUE and will send in script for Vicodin. Patient notified.

## 2019-12-04 ENCOUNTER — Other Ambulatory Visit: Payer: Self-pay | Admitting: Oncology

## 2019-12-04 MED ORDER — HYDROCODONE-ACETAMINOPHEN 5-325 MG PO TABS
1.0000 | ORAL_TABLET | ORAL | 0 refills | Status: DC | PRN
Start: 2019-12-04 — End: 2020-01-20

## 2019-12-04 NOTE — Telephone Encounter (Signed)
Informed him that script for hydrocodone has been sent in and his CT scan was moved to 12/10/19 at 0715/0730 (1st available).

## 2019-12-07 ENCOUNTER — Other Ambulatory Visit: Payer: Self-pay

## 2019-12-07 ENCOUNTER — Other Ambulatory Visit: Payer: Self-pay | Admitting: Internal Medicine

## 2019-12-07 ENCOUNTER — Inpatient Hospital Stay: Payer: Medicare HMO | Attending: Nurse Practitioner | Admitting: Nurse Practitioner

## 2019-12-07 ENCOUNTER — Inpatient Hospital Stay: Payer: Medicare HMO

## 2019-12-07 ENCOUNTER — Encounter: Payer: Self-pay | Admitting: Nurse Practitioner

## 2019-12-07 VITALS — BP 156/89 | HR 94 | Temp 97.6°F | Resp 17 | Ht 64.0 in | Wt 191.6 lb

## 2019-12-07 DIAGNOSIS — I110 Hypertensive heart disease with heart failure: Secondary | ICD-10-CM | POA: Insufficient documentation

## 2019-12-07 DIAGNOSIS — J449 Chronic obstructive pulmonary disease, unspecified: Secondary | ICD-10-CM | POA: Diagnosis not present

## 2019-12-07 DIAGNOSIS — E860 Dehydration: Secondary | ICD-10-CM | POA: Diagnosis not present

## 2019-12-07 DIAGNOSIS — M25511 Pain in right shoulder: Secondary | ICD-10-CM | POA: Insufficient documentation

## 2019-12-07 DIAGNOSIS — Z86711 Personal history of pulmonary embolism: Secondary | ICD-10-CM | POA: Diagnosis not present

## 2019-12-07 DIAGNOSIS — I5032 Chronic diastolic (congestive) heart failure: Secondary | ICD-10-CM | POA: Diagnosis not present

## 2019-12-07 DIAGNOSIS — C220 Liver cell carcinoma: Secondary | ICD-10-CM

## 2019-12-07 DIAGNOSIS — M19011 Primary osteoarthritis, right shoulder: Secondary | ICD-10-CM | POA: Diagnosis not present

## 2019-12-07 DIAGNOSIS — I2510 Atherosclerotic heart disease of native coronary artery without angina pectoris: Secondary | ICD-10-CM | POA: Insufficient documentation

## 2019-12-07 DIAGNOSIS — E119 Type 2 diabetes mellitus without complications: Secondary | ICD-10-CM | POA: Insufficient documentation

## 2019-12-07 DIAGNOSIS — K7460 Unspecified cirrhosis of liver: Secondary | ICD-10-CM | POA: Diagnosis not present

## 2019-12-07 DIAGNOSIS — Z23 Encounter for immunization: Secondary | ICD-10-CM | POA: Insufficient documentation

## 2019-12-07 LAB — CMP (CANCER CENTER ONLY)
ALT: 130 U/L — ABNORMAL HIGH (ref 0–44)
AST: 115 U/L — ABNORMAL HIGH (ref 15–41)
Albumin: 3.2 g/dL — ABNORMAL LOW (ref 3.5–5.0)
Alkaline Phosphatase: 189 U/L — ABNORMAL HIGH (ref 38–126)
Anion gap: 7 (ref 5–15)
BUN: 15 mg/dL (ref 8–23)
CO2: 23 mmol/L (ref 22–32)
Calcium: 8.9 mg/dL (ref 8.9–10.3)
Chloride: 102 mmol/L (ref 98–111)
Creatinine: 1.19 mg/dL (ref 0.61–1.24)
GFR, Est AFR Am: >60 mL/min (ref >60)
GFR, Estimated: >60 mL/min (ref >60)
Glucose, Bld: 180 mg/dL — ABNORMAL HIGH (ref 70–99)
Potassium: 4 mmol/L (ref 3.5–5.1)
Sodium: 132 mmol/L — ABNORMAL LOW (ref 135–145)
Total Bilirubin: 0.8 mg/dL (ref 0.3–1.2)
Total Protein: 7.7 g/dL (ref 6.5–8.1)

## 2019-12-07 LAB — CBC WITH DIFFERENTIAL (CANCER CENTER ONLY)
Abs Immature Granulocytes: 0.01 10*3/uL (ref 0.00–0.07)
Basophils Absolute: 0.1 10*3/uL (ref 0.0–0.1)
Basophils Relative: 1 %
Eosinophils Absolute: 0.1 10*3/uL (ref 0.0–0.5)
Eosinophils Relative: 2 %
HCT: 49.1 % (ref 39.0–52.0)
Hemoglobin: 16.4 g/dL (ref 13.0–17.0)
Immature Granulocytes: 0 %
Lymphocytes Relative: 33 %
Lymphs Abs: 2.3 10*3/uL (ref 0.7–4.0)
MCH: 26.7 pg (ref 26.0–34.0)
MCHC: 33.4 g/dL (ref 30.0–36.0)
MCV: 79.8 fL — ABNORMAL LOW (ref 80.0–100.0)
Monocytes Absolute: 0.6 10*3/uL (ref 0.1–1.0)
Monocytes Relative: 8 %
Neutro Abs: 3.8 10*3/uL (ref 1.7–7.7)
Neutrophils Relative %: 56 %
Platelet Count: 439 10*3/uL — ABNORMAL HIGH (ref 150–400)
RBC: 6.15 MIL/uL — ABNORMAL HIGH (ref 4.22–5.81)
RDW: 14.7 % (ref 11.5–15.5)
WBC Count: 6.8 10*3/uL (ref 4.0–10.5)
nRBC: 0 % (ref 0.0–0.2)

## 2019-12-07 LAB — MAGNESIUM: Magnesium: 1.7 mg/dL (ref 1.7–2.4)

## 2019-12-07 NOTE — Progress Notes (Signed)
[facility] OFFICE PROGRESS NOTE   Diagnosis: Hepatocellular carcinoma  INTERVAL HISTORY:   [name] returns as scheduled.  He resumed lenvatinib at a reduced dose of 4 mg daily 12/01/2019.  He denies diarrhea.  No nausea or vomiting.  No new mouth sores.  Existing mouth sores continue to improve.  He notes increased pain at the right upper arm/shoulder.  He is scheduled for a CT scan later this week.  Objective:  Vital signs in last 24 hours:  Blood pressure (!) 156/89, pulse 94, temperature 97.6 F (36.4 C), temperature source Tympanic, resp. rate 17, height 5\' 4"  (1.626 m), weight 191 lb 9.6 oz (86.9 kg), SpO2 100 %.    HEENT: Healing ulcerations anterior buccal mucosa bilaterally. Resp: Lungs clear bilaterally. Cardio: Regular rate and rhythm. GI: Abdomen soft and nontender.  No hepatomegaly. Vascular: No leg edema.  Lab Results:  Lab Results  Component Value Date   WBC 6.8 12/07/2019   HGB 16.4 12/07/2019   HCT 49.1 12/07/2019   MCV 79.8 (L) 12/07/2019   PLT 439 (H) 12/07/2019   NEUTROABS 3.8 12/07/2019    Imaging:  No results found.  Medications: I have reviewed the patient's current medications.  Assessment/Plan: 1. Hepatocellular carcinoma  02/12/2019 AFP 10,200  02/18/2019 CT abdomen-dominant poorly marginated heterogeneously enhancing 8.6 x 7.7 cm posterior right liver lobe mass, new compared to CT study 09/10/2016. Numerous similar-appearing liver masses scattered throughout the liver all new since 09/10/2016 CT. Diffusely irregular liver surface compatible with cirrhosis. Spleen normal size. No ascites. No abdominal adenopathy.  MRI liver 03/05/2019-multifocal hepatic metastases including a 10 cm lesion in the right liver,Li-rads 5, no evidence of metastatic disease  Cycle 1 atezolizumab/bevacizumab 03/20/2019  Cycle 2 atezolizumab/bevacizumab 04/24/2019  Cycle 3 atezolizumab/bevacizumab 05/15/2019  Cycle 4 atezolizumab/bevacizumab  06/05/2019  MRI liver 06/24/2019-multifocal hepatocellular carcinoma, lesions stable to decreased in size. No evidence for disease progression. Cirrhosis.  Cycle 5 atezolizumab/bevacizumab 06/25/2019  Cycle 6 atezolizumab/bevacizumab 07/17/2019  Cycle 7 atezolizumab/bevacizumab 08/07/2019  Cycle 8 atezolizumab/bevacizumab 08/28/2019  Cycle 9atezolizumab/bevacizumab 09/18/2019  Cycle 10 atezolizumab/bevacizumab 10/09/2019  MRI liver 10/27/2019-enlargement of multifocal liver lesions, cirrhosis  Lenvatinib beginning 11/10/2019, held starting 11/23/2019 secondary to diarrhea and mucositis  Lenvatinib resumed at 4 mg daily beginning 12/01/2019 2. 01/20/2019 chest CT-no definite evidence of pulmonary embolus. Wall thickening of the distal esophagus. Nodular hepatic contours. 3. Cirrhosis  4. Hepatitis C status post Harvoni 5. Type 2 diabetes 6. COPD 7. CAD 8. Chronic diastolic heart failure 9. Hypertension 10. Positive Cologuard 2019 11. Diarrhea and mucositis 11/23/2019 12. Dehydration secondary to diarrhea 11/23/2019 13. Right arm/shoulder pain-plain x-ray right shoulder with slight irregularity inferior glenoid, no fracture or dislocation; right humerus with mild degenerative changes in the acromioclavicular joint, thinning humeral joint, no acute fracture or dislocation and no lytic or sclerotic lesion noted.  Pain resolved 11/30/2019.  Worsening pain, scheduled for CT 12/10/2019.   Disposition: [name] appears stable.  He resumed lenvatinib at a reduced dose 12/01/2019.  So far he is tolerating well.  He will continue at the current dose of 4 mg daily.  We reviewed the CBC and chemistry panel from today.  Labs adequate to continue lenvatinib.  Plan for CT right arm/shoulder 12/10/2019.  We will see him on follow-up 12/11/2019 to review the CT results.  He will contact the office in the interim with any problems.  Plan reviewed with Dr. Benay Spice.    Ned Card ANP/GNP-BC   12/07/2019   1:59 PM

## 2019-12-08 ENCOUNTER — Telehealth: Payer: Self-pay | Admitting: Nurse Practitioner

## 2019-12-08 NOTE — Telephone Encounter (Signed)
Scheduled appointment per 10/4 los. Spoke to patient who is aware of appointment date and time.

## 2019-12-09 ENCOUNTER — Other Ambulatory Visit: Payer: Self-pay | Admitting: Oncology

## 2019-12-09 DIAGNOSIS — C220 Liver cell carcinoma: Secondary | ICD-10-CM

## 2019-12-10 ENCOUNTER — Ambulatory Visit (HOSPITAL_COMMUNITY)
Admission: RE | Admit: 2019-12-10 | Discharge: 2019-12-10 | Disposition: A | Discharge status: Home / Self Care | Payer: Medicare HMO | Source: Ambulatory Visit | Attending: Oncology | Admitting: Oncology

## 2019-12-10 ENCOUNTER — Other Ambulatory Visit: Payer: Self-pay

## 2019-12-10 DIAGNOSIS — C220 Liver cell carcinoma: Secondary | ICD-10-CM | POA: Insufficient documentation

## 2019-12-10 DIAGNOSIS — Z8505 Personal history of malignant neoplasm of liver: Secondary | ICD-10-CM | POA: Diagnosis not present

## 2019-12-10 DIAGNOSIS — M79621 Pain in right upper arm: Secondary | ICD-10-CM | POA: Diagnosis not present

## 2019-12-10 DIAGNOSIS — M19011 Primary osteoarthritis, right shoulder: Secondary | ICD-10-CM | POA: Diagnosis not present

## 2019-12-10 DIAGNOSIS — M19021 Primary osteoarthritis, right elbow: Secondary | ICD-10-CM | POA: Diagnosis not present

## 2019-12-11 ENCOUNTER — Other Ambulatory Visit: Payer: Self-pay

## 2019-12-11 ENCOUNTER — Ambulatory Visit (HOSPITAL_COMMUNITY): Payer: Medicare HMO

## 2019-12-11 ENCOUNTER — Encounter: Payer: Self-pay | Admitting: Nurse Practitioner

## 2019-12-11 ENCOUNTER — Inpatient Hospital Stay (HOSPITAL_BASED_OUTPATIENT_CLINIC_OR_DEPARTMENT_OTHER): Payer: Medicare HMO | Admitting: Nurse Practitioner

## 2019-12-11 VITALS — BP 137/76 | HR 112 | Temp 97.9°F | Resp 17 | Ht 64.0 in | Wt 191.8 lb

## 2019-12-11 DIAGNOSIS — Z23 Encounter for immunization: Secondary | ICD-10-CM | POA: Diagnosis not present

## 2019-12-11 DIAGNOSIS — I2510 Atherosclerotic heart disease of native coronary artery without angina pectoris: Secondary | ICD-10-CM | POA: Diagnosis not present

## 2019-12-11 DIAGNOSIS — Z86711 Personal history of pulmonary embolism: Secondary | ICD-10-CM | POA: Diagnosis not present

## 2019-12-11 DIAGNOSIS — I110 Hypertensive heart disease with heart failure: Secondary | ICD-10-CM | POA: Diagnosis not present

## 2019-12-11 DIAGNOSIS — C220 Liver cell carcinoma: Secondary | ICD-10-CM | POA: Diagnosis not present

## 2019-12-11 DIAGNOSIS — I5032 Chronic diastolic (congestive) heart failure: Secondary | ICD-10-CM | POA: Diagnosis not present

## 2019-12-11 DIAGNOSIS — K7460 Unspecified cirrhosis of liver: Secondary | ICD-10-CM | POA: Diagnosis not present

## 2019-12-11 DIAGNOSIS — E119 Type 2 diabetes mellitus without complications: Secondary | ICD-10-CM | POA: Diagnosis not present

## 2019-12-11 DIAGNOSIS — J449 Chronic obstructive pulmonary disease, unspecified: Secondary | ICD-10-CM | POA: Diagnosis not present

## 2019-12-11 MED ORDER — INFLUENZA VAC A&B SA ADJ QUAD 0.5 ML IM PRSY: 0.5000 mL | PREFILLED_SYRINGE | Freq: Once | INTRAMUSCULAR | Status: DC

## 2019-12-11 MED ORDER — INFLUENZA VAC A&B SA ADJ QUAD 0.5 ML IM PRSY
PREFILLED_SYRINGE | INTRAMUSCULAR | Status: AC
  Filled 2019-12-11: qty 0.5

## 2019-12-11 MED ORDER — INFLUENZA VAC A&B SA ADJ QUAD 0.5 ML IM PRSY
0.5000 mL | PREFILLED_SYRINGE | Freq: Once | INTRAMUSCULAR | Status: AC
  Administered 2019-12-11: 0.5 mL via INTRAMUSCULAR

## 2019-12-11 NOTE — Progress Notes (Addendum)
[place] OFFICE PROGRESS NOTE   Diagnosis: Hepatocellular carcinoma  INTERVAL HISTORY:   [name] returns as scheduled.  He continues lenvatinib 4 mg daily.  He denies nausea/vomiting.  Mouth sores continue to improve.  No diarrhea.  Right shoulder pain is better.  Objective:  Vital signs in last 24 hours:  Blood pressure 137/76, pulse (!) 112, temperature 97.9 F (36.6 C), temperature source Tympanic, resp. rate 17, height 5\' 4"  (1.626 m), weight 191 lb 12.8 oz (87 kg), SpO2 99 %.    HEENT: Superficial ulcerations at the anterior buccal regions continue to improve. Resp: Lungs clear bilaterally. Cardio: Regular rate and rhythm. GI: Abdomen soft and nontender.  No hepatomegaly. Vascular: No leg edema.   Lab Results:  Lab Results  Component Value Date   WBC 6.8 12/07/2019   HGB 16.4 12/07/2019   HCT 49.1 12/07/2019   MCV 79.8 (L) 12/07/2019   PLT 439 (H) 12/07/2019   NEUTROABS 3.8 12/07/2019    Imaging:  CT Humerus Right Wo Contrast  Result Date: 12/10/2019 CLINICAL DATA:  Right shoulder and upper arm pain for the past month. History of hepatocellular carcinoma. EXAM: CT OF THE RIGHT HUMERUS WITHOUT CONTRAST TECHNIQUE: Multidetector CT imaging was performed according to the standard protocol. Multiplanar CT image reconstructions were also generated. COMPARISON:  Right shoulder and humerus x-rays dated November 23, 2019. FINDINGS: Bones/Joint/Cartilage There is a 3 x 4 x 9 mm oval lucent lesion in the posterior cortex of the proximal humeral metaphysis (series 7, image 52; series 8, image 52). No additional focal bone lesion. No fracture or dislocation. Moderate acromioclavicular osteoarthritis. Mild glenohumeral and elbow osteoarthritis. No joint effusion. Ligaments Ligaments are suboptimally evaluated by CT. Muscles and Tendons Suspected full-thickness supraspinatus tear with mild muscle atrophy. Soft tissue No fluid collection or hematoma.  No soft tissue  mass. Incidentally noted nodular liver contour consistent with history of cirrhosis. IMPRESSION: 1. Suspected full-thickness supraspinatus tear with mild muscle atrophy. Consider further evaluation with MRI. 2. 9 mm oval lucent lesion in the posterior cortex of the proximal humeral metaphysis is nonspecific, but less likely to represent metastatic disease. This could be further evaluated with with a right shoulder MRI as well. 3. Moderate acromioclavicular and mild glenohumeral osteoarthritis. Electronically Signed   By: Titus Dubin M.D.   On: 12/10/2019 10:22    Medications: I have reviewed the patient's current medications.  Assessment/Plan: 1. Hepatocellular carcinoma  02/12/2019 AFP 10,200  02/18/2019 CT abdomen-dominant poorly marginated heterogeneously enhancing 8.6 x 7.7 cm posterior right liver lobe mass, new compared to CT study 09/10/2016. Numerous similar-appearing liver masses scattered throughout the liver all new since 09/10/2016 CT. Diffusely irregular liver surface compatible with cirrhosis. Spleen normal size. No ascites. No abdominal adenopathy.  MRI liver 03/05/2019-multifocal hepatic metastases including a 10 cm lesion in the right liver,Li-rads 5, no evidence of metastatic disease  Cycle 1 atezolizumab/bevacizumab 03/20/2019  Cycle 2 atezolizumab/bevacizumab 04/24/2019  Cycle 3 atezolizumab/bevacizumab 05/15/2019  Cycle 4 atezolizumab/bevacizumab 06/05/2019  MRI liver 06/24/2019-multifocal hepatocellular carcinoma, lesions stable to decreased in size. No evidence for disease progression. Cirrhosis.  Cycle 5 atezolizumab/bevacizumab 06/25/2019  Cycle 6 atezolizumab/bevacizumab 07/17/2019  Cycle 7 atezolizumab/bevacizumab 08/07/2019  Cycle 8 atezolizumab/bevacizumab 08/28/2019  Cycle 9atezolizumab/bevacizumab 09/18/2019  Cycle 10 atezolizumab/bevacizumab 10/09/2019  MRI liver 10/27/2019-enlargement of multifocal liver lesions, cirrhosis  Lenvatinib beginning  11/10/2019, held starting 11/23/2019 secondary to diarrhea and mucositis  Lenvatinib resumed at 4 mg daily beginning 12/01/2019 2. 01/20/2019 chest CT-no definite evidence of pulmonary embolus.  Wall thickening of the distal esophagus. Nodular hepatic contours. 3. Cirrhosis  4. Hepatitis C status post Harvoni 5. Type 2 diabetes 6. COPD 7. CAD 8. Chronic diastolic heart failure 9. Hypertension 10. Positive Cologuard 2019 11. Diarrhea and mucositis 11/23/2019 12. Dehydration secondary to diarrhea 11/23/2019 13. Right arm/shoulder pain-plain x-ray right shoulder with slight irregularity inferior glenoid, no fracture or dislocation; right humerus with mild degenerative changes in the acromioclavicular joint, thinning humeral joint, no acute fracture or dislocation and no lytic or sclerotic lesion noted.Pain resolved 11/30/2019.    CT 12/10/2019-suspected full-thickness supraspinatus tear with mild muscle atrophy.  3 x 4 x 9 mm oval lucent lesion in the posterior cortex of the proximal humeral metaphysis, nonspecific, unlikely to represent metastatic disease; moderate acromioclavicular and mild glenohumeral osteoarthritis.   Disposition: [name] appears stable.  He will continue lenvatinib at the current dose of 4 mg daily.  He understands to contact the office if he develops recurrent mouth sores, diarrhea or rash.  We reviewed the recent CT scan.  He appears to have a muscle tear.  The arm/shoulder pain is improving.  We will continue to monitor.  He will receive the influenza vaccine today.  He will return for lab and follow-up in 2 to 3 weeks.  Patient seen with Dr. Benay Spice.    Ned Card ANP/GNP-BC   12/11/2019  12:13 PM This was a shared visit with Ned Card.  [name] is interviewed and examined.  He is tolerating the reduced dose of lenvatinib well.  We will continue treatment at the current dose.  The shoulder pain appears to be related to a supraspinatus tear.  The pain has  improved.  Kyle Manson, MD

## 2019-12-14 ENCOUNTER — Telehealth: Payer: Self-pay | Admitting: Nurse Practitioner

## 2019-12-14 NOTE — Telephone Encounter (Signed)
Scheduled appointments per 10/8 los. Spoke to patient who is aware of appointments date and times.

## 2019-12-21 ENCOUNTER — Other Ambulatory Visit: Payer: Self-pay

## 2019-12-21 NOTE — Patient Outreach (Signed)
[facility]) Care Management  [place]  12/21/2019   [name] Aug 02, 1950 [id number]  Subjective: Telephone call to patient for disease management follow up. Patient reports that his cancer is worse and that he is on another treatment.  He states they cut back on the medications and will reevaluate on 12-30-19.  Discussed management of symptoms of diarrhea and mouth soreness.  He verbalized understanding.  Patient reports sugars have been up and down but not in the 200's. Discussed continues diabetes management and management of cancer treatment symptoms.  He verbalized understanding and voices no concerns.      Objective:   Encounter Medications:  Outpatient Encounter Medications as of 12/21/2019  Medication Sig Note  . Accu-Chek FastClix Lancets MISC 1 each by Does not apply route 4 (four) times daily. E11.9   . albuterol (VENTOLIN HFA) 108 (90 Base) MCG/ACT inhaler Inhale 1-2 puffs into the lungs every 4 (four) hours as needed for wheezing or shortness of breath. 02/11/2019: On hand  . Alcohol Swabs (B-D SINGLE USE SWABS REGULAR) PADS Use to swab finger 4 times daily.   [name] aspirin EC 81 MG tablet Take 1 tablet (81 mg total) by mouth daily.   [name] atorvastatin (LIPITOR) 40 MG tablet TAKE 1 TABLET EVERY DAY   . betamethasone valerate ointment (VALISONE) 0.1 % APPLY ONE APPLICATION TOPICALLY TWO TIMES DAILY   . Blood Glucose Calibration (ACCU-CHEK GUIDE CONTROL) LIQD 1 each by Other route as needed (Use to calibrate meter prn). E11.9   . Blood Glucose Monitoring Suppl (ACCU-CHEK GUIDE ME) w/Device KIT 1 kit by Does not apply route See admin instructions. Check blood sugar 4 times a day   . cholecalciferol (VITAMIN D) 1000 UNITS tablet Take 1 tablet (1,000 Units total) by mouth daily.   . Cinnamon 500 MG capsule Take 500 mg by mouth 2 (two) times daily.    . Fluticasone-Salmeterol (ADVAIR) 100-50 MCG/DOSE AEPB Inhale 1 puff into the lungs 2 (two) times daily.   [name]  glucose blood (ACCU-CHEK GUIDE) test strip 1 each by Other route 4 (four) times daily.   [name] HOMEOPATHIC PRODUCTS PO Take by mouth daily. "Hyland's Leg Cramps"    . HYDROcodone-acetaminophen (NORCO/VICODIN) 5-325 MG tablet Take 1 tablet by mouth every 4 (four) hours as needed for moderate pain.   . hydrOXYzine (ATARAX/VISTARIL) 25 MG tablet Take 1 tablet by mouth three times daily as needed   . insulin NPH-regular Human (NOVOLIN 70/30) (70-30) 100 UNIT/ML injection Inject 160 Units into the skin daily with breakfast.   . Insulin Syringe-Needle U-100 (BD VEO INSULIN SYRINGE U/F) 31G X 15/64" 1 ML MISC Use for injecting insulin once a day.   . latanoprost (XALATAN) 0.005 % ophthalmic solution Place 1 drop into both eyes daily.    . Lenvatinib 12 mg daily dose (LENVIMA) 3 x 4 MG capsule Take 12 mg by mouth daily. 11/23/2019: 11/23/19: HOLD  . lisinopril-hydrochlorothiazide (ZESTORETIC) 20-25 MG tablet Take 1 tablet by mouth daily. APPOINTMENT NEEDED FOR ADDITIONAL REFILLS   . loperamide (IMODIUM) 2 MG capsule Take 2-4 mg by mouth 4 (four) times daily as needed for diarrhea or loose stools.    . magic mouthwash SOLN Take 5-10 mLs by mouth 4 (four) times daily as needed for mouth pain (Swish and swallow).   . methocarbamol (ROBAXIN) 500 MG tablet Take 1 tablet (500 mg total) by mouth 2 (two) times daily.   . metoCLOPramide (REGLAN) 5 MG tablet Take 1 tablet (5 mg  total) by mouth every 8 (eight) hours as needed for nausea. 11/23/2019: 11/23/19: HOLD  . Multiple Vitamins-Minerals (MULTIVITAMIN WITH MINERALS) tablet Take 1 tablet by mouth daily.    . Omega-3 Fatty Acids (FISH OIL) 1000 MG CAPS Take 2 capsules by mouth daily.    . pantoprazole (PROTONIX) 40 MG tablet TAKE 1 TABLET BY MOUTH ONCE DAILY BEFORE  BREAKFAST.   [name] prochlorperazine (COMPAZINE) 10 MG tablet Take 1 tablet (10 mg total) by mouth every 6 (six) hours as needed.   . sildenafil (REVATIO) 20 MG tablet TAKE 5 TABLETS BY MOUTH DAILY AS NEEDED  (Patient not taking: Reported on 12/07/2019)   . sildenafil (VIAGRA) 100 MG tablet Take 0.5-1 tablets (50-100 mg total) by mouth daily as needed for erectile dysfunction. (Patient not taking: Reported on 12/07/2019)   . traZODone (DESYREL) 50 MG tablet TAKE 1 TABLET AT BEDTIME AS NEEDED FOR SLEEP   . umeclidinium-vilanterol (ANORO ELLIPTA) 62.5-25 MCG/INH AEPB Inhale 1 puff into the lungs daily.     No facility-administered encounter medications on file as of 12/21/2019.    Functional Status:  In your present state of health, do you have any difficulty performing the following activities: 10/26/2019  Hearing? N  Vision? N  Difficulty concentrating or making decisions? N  Walking or climbing stairs? N  Dressing or bathing? N  Doing errands, shopping? N  Preparing Food and eating ? N  Using the Toilet? N  In the past six months, have you accidently leaked urine? N  Do you have problems with loss of bowel control? N  Managing your Medications? N  Managing your Finances? N  Housekeeping or managing your Housekeeping? N  Some recent data might be hidden    Fall/Depression Screening: Fall Risk  10/26/2019 08/24/2019 06/15/2019  Falls in the past year? 0 0 0  Number falls in past yr: - - -  Follow up - - -   PHQ 2/9 Scores 10/26/2019 06/15/2019 09/15/2018 09/02/2018 08/29/2018 08/23/2017 06/24/2015  PHQ - 2 Score 0 0 0 0 1 1 0  PHQ- 9 Score - - - - - 2 -    Assessment: Patient managing chronic conditions but having new symptoms with cancer treatments.  However, those are currently being managed.  Patient benefits from disease management support. Goals Addressed            This Visit's Progress   . Monitor and Manage My Blood Sugar       Follow Up Date 03/03/21   - check blood sugar at prescribed times - check blood sugar if I feel it is too high or too low - enter blood sugar readings and medication or insulin into daily log - take the blood sugar log to all doctor visits    Why is  this important?   Checking your blood sugar at home helps to keep it from getting very high or very low.  Writing the results in a diary or log helps the doctor know how to care for you.  Your blood sugar log should have the time, date and the results.  Also, write down the amount of insulin or other medicine that you take.  Other information, like what you ate, exercise done and how you were feeling, will also be helpful.     Notes:        Plan: RN CM will contact patient in the month of December and patient agreeable.    [name], RN, MSN [facility] Care Management  Care Management Coordinator Direct Line [phone] Cell [phone] Toll Free: [phone]  Fax: [phone]

## 2019-12-30 ENCOUNTER — Other Ambulatory Visit: Payer: Self-pay

## 2019-12-30 ENCOUNTER — Inpatient Hospital Stay (HOSPITAL_BASED_OUTPATIENT_CLINIC_OR_DEPARTMENT_OTHER): Payer: Medicare HMO | Admitting: Oncology

## 2019-12-30 ENCOUNTER — Inpatient Hospital Stay: Payer: Medicare HMO

## 2019-12-30 VITALS — BP 167/81 | HR 97 | Temp 98.4°F | Resp 17 | Ht 64.0 in | Wt 195.8 lb

## 2019-12-30 DIAGNOSIS — C220 Liver cell carcinoma: Secondary | ICD-10-CM | POA: Diagnosis not present

## 2019-12-30 DIAGNOSIS — Z23 Encounter for immunization: Secondary | ICD-10-CM | POA: Diagnosis not present

## 2019-12-30 DIAGNOSIS — J449 Chronic obstructive pulmonary disease, unspecified: Secondary | ICD-10-CM | POA: Diagnosis not present

## 2019-12-30 DIAGNOSIS — I5032 Chronic diastolic (congestive) heart failure: Secondary | ICD-10-CM | POA: Diagnosis not present

## 2019-12-30 DIAGNOSIS — E119 Type 2 diabetes mellitus without complications: Secondary | ICD-10-CM | POA: Diagnosis not present

## 2019-12-30 DIAGNOSIS — K7460 Unspecified cirrhosis of liver: Secondary | ICD-10-CM | POA: Diagnosis not present

## 2019-12-30 DIAGNOSIS — I2510 Atherosclerotic heart disease of native coronary artery without angina pectoris: Secondary | ICD-10-CM | POA: Diagnosis not present

## 2019-12-30 DIAGNOSIS — I110 Hypertensive heart disease with heart failure: Secondary | ICD-10-CM | POA: Diagnosis not present

## 2019-12-30 DIAGNOSIS — Z86711 Personal history of pulmonary embolism: Secondary | ICD-10-CM | POA: Diagnosis not present

## 2019-12-30 LAB — CBC WITH DIFFERENTIAL (CANCER CENTER ONLY)
Abs Immature Granulocytes: 0.02 10*3/uL (ref 0.00–0.07)
Basophils Absolute: 0.1 10*3/uL (ref 0.0–0.1)
Basophils Relative: 1 %
Eosinophils Absolute: 0.1 10*3/uL (ref 0.0–0.5)
Eosinophils Relative: 1 %
HCT: 47 % (ref 39.0–52.0)
Hemoglobin: 15.8 g/dL (ref 13.0–17.0)
Immature Granulocytes: 0 %
Lymphocytes Relative: 25 %
Lymphs Abs: 2.1 10*3/uL (ref 0.7–4.0)
MCH: 26.5 pg (ref 26.0–34.0)
MCHC: 33.6 g/dL (ref 30.0–36.0)
MCV: 78.7 fL — ABNORMAL LOW (ref 80.0–100.0)
Monocytes Absolute: 0.6 10*3/uL (ref 0.1–1.0)
Monocytes Relative: 7 %
Neutro Abs: 5.4 10*3/uL (ref 1.7–7.7)
Neutrophils Relative %: 66 %
Platelet Count: 387 10*3/uL (ref 150–400)
RBC: 5.97 MIL/uL — ABNORMAL HIGH (ref 4.22–5.81)
RDW: 15.6 % — ABNORMAL HIGH (ref 11.5–15.5)
WBC Count: 8.2 10*3/uL (ref 4.0–10.5)
nRBC: 0 % (ref 0.0–0.2)

## 2019-12-30 LAB — CMP (CANCER CENTER ONLY)
ALT: 125 U/L — ABNORMAL HIGH (ref 0–44)
AST: 136 U/L — ABNORMAL HIGH (ref 15–41)
Albumin: 3.3 g/dL — ABNORMAL LOW (ref 3.5–5.0)
Alkaline Phosphatase: 242 U/L — ABNORMAL HIGH (ref 38–126)
Anion gap: 10 (ref 5–15)
BUN: 14 mg/dL (ref 8–23)
CO2: 22 mmol/L (ref 22–32)
Calcium: 9.3 mg/dL (ref 8.9–10.3)
Chloride: 102 mmol/L (ref 98–111)
Creatinine: 1.13 mg/dL (ref 0.61–1.24)
GFR, Estimated: >60 mL/min (ref >60)
Glucose, Bld: 190 mg/dL — ABNORMAL HIGH (ref 70–99)
Potassium: 3.8 mmol/L (ref 3.5–5.1)
Sodium: 134 mmol/L — ABNORMAL LOW (ref 135–145)
Total Bilirubin: 1.1 mg/dL (ref 0.3–1.2)
Total Protein: 7.8 g/dL (ref 6.5–8.1)

## 2019-12-30 NOTE — Progress Notes (Signed)
[place] OFFICE PROGRESS NOTE   Diagnosis: Hepatocellular carcinoma  INTERVAL HISTORY:   [name] continues lenvatinib.  No diarrhea.  Mouth soreness has improved.  He feels well.  Good appetite. Right shoulder pain is much improved. Objective:  Vital signs in last 24 hours:  Blood pressure (!) 167/81, pulse 97, temperature 98.4 F (36.9 C), temperature source Tympanic, resp. rate 17, height 5\' 4"  (1.626 m), weight 195 lb 12.8 oz (88.8 kg), SpO2 100 %.    HEENT: Ulceration at the anterior left greater than right buccal mucosa-improved, no thrush Resp: Lungs clear bilaterally Cardio: Regular rate and rhythm GI: No hepatomegaly, nontender Vascular: No leg edema  Skin: Hyperpigmented lesions over the trunk and extremities, some with ulceration, mild superficial desquamation at the fingers    Lab Results:  Lab Results  Component Value Date   WBC 8.2 12/30/2019   HGB 15.8 12/30/2019   HCT 47.0 12/30/2019   MCV 78.7 (L) 12/30/2019   PLT 387 12/30/2019   NEUTROABS 5.4 12/30/2019    CMP  Lab Results  Component Value Date   NA 134 (L) 12/30/2019   K 3.8 12/30/2019   CL 102 12/30/2019   CO2 22 12/30/2019   GLUCOSE 190 (H) 12/30/2019   BUN 14 12/30/2019   CREATININE 1.13 12/30/2019   CALCIUM 9.3 12/30/2019   PROT 7.8 12/30/2019   ALBUMIN 3.3 (L) 12/30/2019   AST 136 (H) 12/30/2019   ALT 125 (H) 12/30/2019   ALKPHOS 242 (H) 12/30/2019   BILITOT 1.1 12/30/2019   GFRNONAA >60 12/30/2019   GFRAA >60 12/07/2019     Medications: I have reviewed the patient's current medications.   Assessment/Plan: 1. Hepatocellular carcinoma  02/12/2019 AFP 10,200  02/18/2019 CT abdomen-dominant poorly marginated heterogeneously enhancing 8.6 x 7.7 cm posterior right liver lobe mass, new compared to CT study 09/10/2016. Numerous similar-appearing liver masses scattered throughout the liver all new since 09/10/2016 CT. Diffusely irregular liver surface compatible  with cirrhosis. Spleen normal size. No ascites. No abdominal adenopathy.  MRI liver 03/05/2019-multifocal hepatic metastases including a 10 cm lesion in the right liver,Li-rads 5, no evidence of metastatic disease  Cycle 1 atezolizumab/bevacizumab 03/20/2019  Cycle 2 atezolizumab/bevacizumab 04/24/2019  Cycle 3 atezolizumab/bevacizumab 05/15/2019  Cycle 4 atezolizumab/bevacizumab 06/05/2019  MRI liver 06/24/2019-multifocal hepatocellular carcinoma, lesions stable to decreased in size. No evidence for disease progression. Cirrhosis.  Cycle 5 atezolizumab/bevacizumab 06/25/2019  Cycle 6 atezolizumab/bevacizumab 07/17/2019  Cycle 7 atezolizumab/bevacizumab 08/07/2019  Cycle 8 atezolizumab/bevacizumab 08/28/2019  Cycle 9atezolizumab/bevacizumab 09/18/2019  Cycle 10 atezolizumab/bevacizumab 10/09/2019  MRI liver 10/27/2019-enlargement of multifocal liver lesions, cirrhosis  Lenvatinib beginning 11/10/2019, held starting 11/23/2019 secondary to diarrhea and mucositis  Lenvatinib resumed at 4 mg daily beginning 12/01/2019 2. 01/20/2019 chest CT-no definite evidence of pulmonary embolus. Wall thickening of the distal esophagus. Nodular hepatic contours. 3. Cirrhosis  4. Hepatitis C status post Harvoni 5. Type 2 diabetes 6. COPD 7. CAD 8. Chronic diastolic heart failure 9. Hypertension 10. Positive Cologuard 2019 11. Diarrhea and mucositis 11/23/2019 12. Dehydration secondary to diarrhea 11/23/2019 13. Right arm/shoulder pain-plain x-ray right shoulder with slight irregularity inferior glenoid, no fracture or dislocation; right humerus with mild degenerative changes in the acromioclavicular joint, thinning humeral joint, no acute fracture or dislocation and no lytic or sclerotic lesion noted.Pain resolved 11/30/2019.    CT 12/10/2019-suspected full-thickness supraspinatus tear with mild muscle atrophy.  3 x 4 x 9 mm oval lucent lesion in the posterior cortex of the proximal humeral metaphysis,  nonspecific, unlikely  to represent metastatic disease; moderate acromioclavicular and mild glenohumeral osteoarthritis.     Disposition: [name] appears to be tolerating the the reduced dose of lenvatinib well.  The liver enzymes are unchanged.  He will continue lenvatinib at the current dose.  He will return for an office and lab visit in 3 weeks.  We will check the AFP when he is here in 3 weeks.  Betsy Coder, MD  12/30/2019  11:22 AM

## 2019-12-31 ENCOUNTER — Telehealth: Payer: Self-pay | Admitting: Oncology

## 2019-12-31 NOTE — Telephone Encounter (Signed)
Scheduled appointment per 10/27 los. Spoke to patient who is aware of appointment date and time.

## 2020-01-05 ENCOUNTER — Other Ambulatory Visit (HOSPITAL_COMMUNITY): Payer: Self-pay | Admitting: Oncology

## 2020-01-05 ENCOUNTER — Other Ambulatory Visit: Payer: Self-pay | Admitting: Oncology

## 2020-01-05 ENCOUNTER — Other Ambulatory Visit: Payer: Self-pay | Admitting: *Deleted

## 2020-01-05 MED ORDER — LENVATINIB (4 MG DAILY DOSE) 4 MG PO CPPK: 4.0000 mg | ORAL_CAPSULE | Freq: Every day | ORAL | 0 refills | Status: DC

## 2020-01-05 NOTE — Progress Notes (Signed)
Refill of lenvatinib with reduced dosing

## 2020-01-07 ENCOUNTER — Encounter (HOSPITAL_COMMUNITY): Payer: Self-pay

## 2020-01-07 ENCOUNTER — Other Ambulatory Visit: Payer: Self-pay

## 2020-01-07 ENCOUNTER — Inpatient Hospital Stay (HOSPITAL_COMMUNITY)
Admission: EM | Admit: 2020-01-07 | Discharge: 2020-01-12 | DRG: 872 | Disposition: A | Discharge status: Home / Self Care | Payer: Medicare HMO | Attending: Internal Medicine | Admitting: Internal Medicine

## 2020-01-07 ENCOUNTER — Emergency Department (HOSPITAL_COMMUNITY): Payer: Medicare HMO

## 2020-01-07 DIAGNOSIS — M75100 Unspecified rotator cuff tear or rupture of unspecified shoulder, not specified as traumatic: Secondary | ICD-10-CM | POA: Diagnosis present

## 2020-01-07 DIAGNOSIS — R1031 Right lower quadrant pain: Secondary | ICD-10-CM

## 2020-01-07 DIAGNOSIS — I700 Atherosclerosis of aorta: Secondary | ICD-10-CM | POA: Diagnosis not present

## 2020-01-07 DIAGNOSIS — C220 Liver cell carcinoma: Secondary | ICD-10-CM | POA: Diagnosis not present

## 2020-01-07 DIAGNOSIS — I110 Hypertensive heart disease with heart failure: Secondary | ICD-10-CM | POA: Diagnosis present

## 2020-01-07 DIAGNOSIS — K5900 Constipation, unspecified: Secondary | ICD-10-CM | POA: Diagnosis present

## 2020-01-07 DIAGNOSIS — R5381 Other malaise: Secondary | ICD-10-CM | POA: Diagnosis not present

## 2020-01-07 DIAGNOSIS — M6282 Rhabdomyolysis: Secondary | ICD-10-CM | POA: Diagnosis not present

## 2020-01-07 DIAGNOSIS — A419 Sepsis, unspecified organism: Secondary | ICD-10-CM | POA: Diagnosis not present

## 2020-01-07 DIAGNOSIS — E669 Obesity, unspecified: Secondary | ICD-10-CM | POA: Diagnosis present

## 2020-01-07 DIAGNOSIS — I10 Essential (primary) hypertension: Secondary | ICD-10-CM | POA: Diagnosis not present

## 2020-01-07 DIAGNOSIS — K7460 Unspecified cirrhosis of liver: Secondary | ICD-10-CM | POA: Diagnosis present

## 2020-01-07 DIAGNOSIS — N390 Urinary tract infection, site not specified: Secondary | ICD-10-CM | POA: Diagnosis not present

## 2020-01-07 DIAGNOSIS — Z87891 Personal history of nicotine dependence: Secondary | ICD-10-CM

## 2020-01-07 DIAGNOSIS — E119 Type 2 diabetes mellitus without complications: Secondary | ICD-10-CM | POA: Diagnosis not present

## 2020-01-07 DIAGNOSIS — R0602 Shortness of breath: Secondary | ICD-10-CM

## 2020-01-07 DIAGNOSIS — R1084 Generalized abdominal pain: Secondary | ICD-10-CM | POA: Diagnosis not present

## 2020-01-07 DIAGNOSIS — R109 Unspecified abdominal pain: Secondary | ICD-10-CM | POA: Diagnosis present

## 2020-01-07 DIAGNOSIS — E872 Acidosis: Secondary | ICD-10-CM | POA: Diagnosis present

## 2020-01-07 DIAGNOSIS — A4181 Sepsis due to Enterococcus: Principal | ICD-10-CM | POA: Diagnosis present

## 2020-01-07 DIAGNOSIS — Y92003 Bedroom of unspecified non-institutional (private) residence as the place of occurrence of the external cause: Secondary | ICD-10-CM

## 2020-01-07 DIAGNOSIS — Z20822 Contact with and (suspected) exposure to covid-19: Secondary | ICD-10-CM | POA: Diagnosis present

## 2020-01-07 DIAGNOSIS — D684 Acquired coagulation factor deficiency: Secondary | ICD-10-CM | POA: Diagnosis present

## 2020-01-07 DIAGNOSIS — Z79899 Other long term (current) drug therapy: Secondary | ICD-10-CM

## 2020-01-07 DIAGNOSIS — R7989 Other specified abnormal findings of blood chemistry: Secondary | ICD-10-CM | POA: Diagnosis present

## 2020-01-07 DIAGNOSIS — K7031 Alcoholic cirrhosis of liver with ascites: Secondary | ICD-10-CM | POA: Diagnosis not present

## 2020-01-07 DIAGNOSIS — J449 Chronic obstructive pulmonary disease, unspecified: Secondary | ICD-10-CM | POA: Diagnosis present

## 2020-01-07 DIAGNOSIS — R52 Pain, unspecified: Secondary | ICD-10-CM

## 2020-01-07 DIAGNOSIS — Z888 Allergy status to other drugs, medicaments and biological substances status: Secondary | ICD-10-CM

## 2020-01-07 DIAGNOSIS — Z6835 Body mass index (BMI) 35.0-35.9, adult: Secondary | ICD-10-CM | POA: Diagnosis not present

## 2020-01-07 DIAGNOSIS — R748 Abnormal levels of other serum enzymes: Secondary | ICD-10-CM | POA: Diagnosis present

## 2020-01-07 DIAGNOSIS — E7800 Pure hypercholesterolemia, unspecified: Secondary | ICD-10-CM | POA: Diagnosis present

## 2020-01-07 DIAGNOSIS — Z0389 Encounter for observation for other suspected diseases and conditions ruled out: Secondary | ICD-10-CM | POA: Diagnosis not present

## 2020-01-07 DIAGNOSIS — K219 Gastro-esophageal reflux disease without esophagitis: Secondary | ICD-10-CM | POA: Diagnosis not present

## 2020-01-07 DIAGNOSIS — R0689 Other abnormalities of breathing: Secondary | ICD-10-CM | POA: Diagnosis not present

## 2020-01-07 DIAGNOSIS — R000 Tachycardia, unspecified: Secondary | ICD-10-CM | POA: Diagnosis not present

## 2020-01-07 DIAGNOSIS — Z823 Family history of stroke: Secondary | ICD-10-CM

## 2020-01-07 DIAGNOSIS — R6510 Systemic inflammatory response syndrome (SIRS) of non-infectious origin without acute organ dysfunction: Secondary | ICD-10-CM | POA: Diagnosis not present

## 2020-01-07 DIAGNOSIS — K7469 Other cirrhosis of liver: Secondary | ICD-10-CM | POA: Diagnosis not present

## 2020-01-07 DIAGNOSIS — M1612 Unilateral primary osteoarthritis, left hip: Secondary | ICD-10-CM | POA: Diagnosis not present

## 2020-01-07 DIAGNOSIS — E1151 Type 2 diabetes mellitus with diabetic peripheral angiopathy without gangrene: Secondary | ICD-10-CM | POA: Diagnosis present

## 2020-01-07 DIAGNOSIS — Z8619 Personal history of other infectious and parasitic diseases: Secondary | ICD-10-CM | POA: Diagnosis not present

## 2020-01-07 DIAGNOSIS — R6520 Severe sepsis without septic shock: Secondary | ICD-10-CM | POA: Diagnosis present

## 2020-01-07 DIAGNOSIS — Z7982 Long term (current) use of aspirin: Secondary | ICD-10-CM

## 2020-01-07 DIAGNOSIS — E871 Hypo-osmolality and hyponatremia: Secondary | ICD-10-CM | POA: Diagnosis not present

## 2020-01-07 DIAGNOSIS — R509 Fever, unspecified: Secondary | ICD-10-CM | POA: Diagnosis not present

## 2020-01-07 DIAGNOSIS — E785 Hyperlipidemia, unspecified: Secondary | ICD-10-CM | POA: Diagnosis present

## 2020-01-07 DIAGNOSIS — N179 Acute kidney failure, unspecified: Secondary | ICD-10-CM | POA: Diagnosis not present

## 2020-01-07 DIAGNOSIS — M25552 Pain in left hip: Secondary | ICD-10-CM | POA: Diagnosis not present

## 2020-01-07 DIAGNOSIS — K7689 Other specified diseases of liver: Secondary | ICD-10-CM | POA: Diagnosis not present

## 2020-01-07 DIAGNOSIS — T796XXA Traumatic ischemia of muscle, initial encounter: Secondary | ICD-10-CM | POA: Diagnosis present

## 2020-01-07 DIAGNOSIS — I5032 Chronic diastolic (congestive) heart failure: Secondary | ICD-10-CM | POA: Diagnosis present

## 2020-01-07 DIAGNOSIS — W19XXXA Unspecified fall, initial encounter: Secondary | ICD-10-CM | POA: Diagnosis not present

## 2020-01-07 DIAGNOSIS — I2510 Atherosclerotic heart disease of native coronary artery without angina pectoris: Secondary | ICD-10-CM | POA: Diagnosis present

## 2020-01-07 DIAGNOSIS — Z833 Family history of diabetes mellitus: Secondary | ICD-10-CM

## 2020-01-07 DIAGNOSIS — Z794 Long term (current) use of insulin: Secondary | ICD-10-CM

## 2020-01-07 DIAGNOSIS — W06XXXA Fall from bed, initial encounter: Secondary | ICD-10-CM | POA: Diagnosis present

## 2020-01-07 DIAGNOSIS — Z83438 Family history of other disorder of lipoprotein metabolism and other lipidemia: Secondary | ICD-10-CM

## 2020-01-07 DIAGNOSIS — R1011 Right upper quadrant pain: Secondary | ICD-10-CM

## 2020-01-07 DIAGNOSIS — R066 Hiccough: Secondary | ICD-10-CM | POA: Diagnosis not present

## 2020-01-07 DIAGNOSIS — Z9842 Cataract extraction status, left eye: Secondary | ICD-10-CM

## 2020-01-07 DIAGNOSIS — Z8249 Family history of ischemic heart disease and other diseases of the circulatory system: Secondary | ICD-10-CM

## 2020-01-07 DIAGNOSIS — Z96652 Presence of left artificial knee joint: Secondary | ICD-10-CM | POA: Diagnosis present

## 2020-01-07 LAB — COMPREHENSIVE METABOLIC PANEL
ALT: 163 U/L — ABNORMAL HIGH (ref 0–44)
AST: 715 U/L — ABNORMAL HIGH (ref 15–41)
Albumin: 3.1 g/dL — ABNORMAL LOW (ref 3.5–5.0)
Alkaline Phosphatase: 236 U/L — ABNORMAL HIGH (ref 38–126)
Anion gap: 13 (ref 5–15)
BUN: 25 mg/dL — ABNORMAL HIGH (ref 8–23)
CO2: 20 mmol/L — ABNORMAL LOW (ref 22–32)
Calcium: 8.4 mg/dL — ABNORMAL LOW (ref 8.9–10.3)
Chloride: 96 mmol/L — ABNORMAL LOW (ref 98–111)
Creatinine, Ser: 1.49 mg/dL — ABNORMAL HIGH (ref 0.61–1.24)
GFR, Estimated: 51 mL/min — ABNORMAL LOW (ref >60)
Glucose, Bld: 137 mg/dL — ABNORMAL HIGH (ref 70–99)
Potassium: 4.4 mmol/L (ref 3.5–5.1)
Sodium: 129 mmol/L — ABNORMAL LOW (ref 135–145)
Total Bilirubin: 4.3 mg/dL — ABNORMAL HIGH (ref 0.3–1.2)
Total Protein: 8.1 g/dL (ref 6.5–8.1)

## 2020-01-07 LAB — CREATININE, SERUM
Creatinine, Ser: 1.23 mg/dL (ref 0.61–1.24)
GFR, Estimated: >60 mL/min (ref >60)

## 2020-01-07 LAB — CBC WITH DIFFERENTIAL/PLATELET
Abs Immature Granulocytes: 0.05 10*3/uL (ref 0.00–0.07)
Basophils Absolute: 0 10*3/uL (ref 0.0–0.1)
Basophils Relative: 0 %
Eosinophils Absolute: 0.4 10*3/uL (ref 0.0–0.5)
Eosinophils Relative: 4 %
HCT: 43.3 % (ref 39.0–52.0)
Hemoglobin: 15 g/dL (ref 13.0–17.0)
Immature Granulocytes: 0 %
Lymphocytes Relative: 9 %
Lymphs Abs: 1.1 10*3/uL (ref 0.7–4.0)
MCH: 26.8 pg (ref 26.0–34.0)
MCHC: 34.6 g/dL (ref 30.0–36.0)
MCV: 77.3 fL — ABNORMAL LOW (ref 80.0–100.0)
Monocytes Absolute: 1.6 10*3/uL — ABNORMAL HIGH (ref 0.1–1.0)
Monocytes Relative: 14 %
Neutro Abs: 8.5 10*3/uL — ABNORMAL HIGH (ref 1.7–7.7)
Neutrophils Relative %: 73 %
Platelets: 510 10*3/uL — ABNORMAL HIGH (ref 150–400)
RBC: 5.6 MIL/uL (ref 4.22–5.81)
RDW: 15.1 % (ref 11.5–15.5)
WBC: 11.7 10*3/uL — ABNORMAL HIGH (ref 4.0–10.5)
nRBC: 0 % (ref 0.0–0.2)

## 2020-01-07 LAB — APTT: aPTT: 39 seconds — ABNORMAL HIGH (ref 24–36)

## 2020-01-07 LAB — GLUCOSE, CAPILLARY: Glucose-Capillary: 148 mg/dL — ABNORMAL HIGH (ref 70–99)

## 2020-01-07 LAB — URINALYSIS, ROUTINE W REFLEX MICROSCOPIC
Bilirubin Urine: NEGATIVE
Glucose, UA: NEGATIVE mg/dL
Hgb urine dipstick: NEGATIVE
Ketones, ur: NEGATIVE mg/dL
Leukocytes,Ua: NEGATIVE
Nitrite: NEGATIVE
Protein, ur: NEGATIVE mg/dL
Specific Gravity, Urine: 1.027 (ref 1.005–1.030)
pH: 6 (ref 5.0–8.0)

## 2020-01-07 LAB — HEPATITIS PANEL, ACUTE
HCV Ab: REACTIVE — AB
Hep A IgM: NONREACTIVE
Hep B C IgM: NONREACTIVE
Hepatitis B Surface Ag: NONREACTIVE

## 2020-01-07 LAB — RESPIRATORY PANEL BY RT PCR (FLU A&B, COVID)
Influenza A by PCR: NEGATIVE
Influenza B by PCR: NEGATIVE
SARS Coronavirus 2 by RT PCR: NEGATIVE

## 2020-01-07 LAB — HEPATIC FUNCTION PANEL
ALT: 129 U/L — ABNORMAL HIGH (ref 0–44)
AST: 483 U/L — ABNORMAL HIGH (ref 15–41)
Albumin: 2.7 g/dL — ABNORMAL LOW (ref 3.5–5.0)
Alkaline Phosphatase: 201 U/L — ABNORMAL HIGH (ref 38–126)
Bilirubin, Direct: 2.2 mg/dL — ABNORMAL HIGH (ref 0.0–0.2)
Indirect Bilirubin: 1.5 mg/dL — ABNORMAL HIGH (ref 0.3–0.9)
Total Bilirubin: 3.7 mg/dL — ABNORMAL HIGH (ref 0.3–1.2)
Total Protein: 6.9 g/dL (ref 6.5–8.1)

## 2020-01-07 LAB — CBC
HCT: 41 % (ref 39.0–52.0)
Hemoglobin: 13.9 g/dL (ref 13.0–17.0)
MCH: 26.6 pg (ref 26.0–34.0)
MCHC: 33.9 g/dL (ref 30.0–36.0)
MCV: 78.5 fL — ABNORMAL LOW (ref 80.0–100.0)
Platelets: 438 10*3/uL — ABNORMAL HIGH (ref 150–400)
RBC: 5.22 MIL/uL (ref 4.22–5.81)
RDW: 15.5 % (ref 11.5–15.5)
WBC: 10.4 10*3/uL (ref 4.0–10.5)
nRBC: 0 % (ref 0.0–0.2)

## 2020-01-07 LAB — HEMOGLOBIN A1C
Hgb A1c MFr Bld: 7.1 % — ABNORMAL HIGH (ref 4.8–5.6)
Mean Plasma Glucose: 157.07 mg/dL

## 2020-01-07 LAB — LACTIC ACID, PLASMA
Lactic Acid, Venous: 2.2 mmol/L (ref 0.5–1.9)
Lactic Acid, Venous: 2 mmol/L (ref 0.5–1.9)
Lactic Acid, Venous: 2.1 mmol/L (ref 0.5–1.9)
Lactic Acid, Venous: 2.1 mmol/L (ref 0.5–1.9)

## 2020-01-07 LAB — PROCALCITONIN: Procalcitonin: 6.41 ng/mL

## 2020-01-07 LAB — HIV ANTIBODY (ROUTINE TESTING W REFLEX): HIV Screen 4th Generation wRfx: NONREACTIVE

## 2020-01-07 LAB — PROTIME-INR
INR: 1.3 — ABNORMAL HIGH (ref 0.8–1.2)
Prothrombin Time: 15.3 seconds — ABNORMAL HIGH (ref 11.4–15.2)

## 2020-01-07 LAB — LIPASE, BLOOD: Lipase: 15 U/L (ref 11–51)

## 2020-01-07 MED ORDER — LATANOPROST 0.005 % OP SOLN
1.0000 [drp] | Freq: Every day | OPHTHALMIC | Status: DC
  Administered 2020-01-07 – 2020-01-11 (×5): 1 [drp] via OPHTHALMIC
  Filled 2020-01-07: qty 2.5

## 2020-01-07 MED ORDER — ACETAMINOPHEN 325 MG PO TABS
650.0000 mg | ORAL_TABLET | Freq: Once | ORAL | Status: AC
  Administered 2020-01-07: 650 mg via ORAL
  Filled 2020-01-07: qty 2

## 2020-01-07 MED ORDER — LACTATED RINGERS IV BOLUS (SEPSIS)
1000.0000 mL | Freq: Once | INTRAVENOUS | Status: AC
  Administered 2020-01-07: 1000 mL via INTRAVENOUS

## 2020-01-07 MED ORDER — ASPIRIN EC 81 MG PO TBEC
81.0000 mg | DELAYED_RELEASE_TABLET | Freq: Every day | ORAL | Status: DC
  Administered 2020-01-08 – 2020-01-12 (×5): 81 mg via ORAL
  Filled 2020-01-07 (×5): qty 1

## 2020-01-07 MED ORDER — INSULIN ASPART 100 UNIT/ML ~~LOC~~ SOLN: 0.0000 [IU] | Freq: Every day | SUBCUTANEOUS | Status: DC

## 2020-01-07 MED ORDER — HYDRALAZINE HCL 25 MG PO TABS
25.0000 mg | ORAL_TABLET | Freq: Three times a day (TID) | ORAL | Status: DC | PRN
  Administered 2020-01-09 – 2020-01-10 (×2): 25 mg via ORAL
  Filled 2020-01-07 (×2): qty 1

## 2020-01-07 MED ORDER — MOMETASONE FURO-FORMOTEROL FUM 100-5 MCG/ACT IN AERO
2.0000 | INHALATION_SPRAY | Freq: Two times a day (BID) | RESPIRATORY_TRACT | Status: DC
  Administered 2020-01-07 – 2020-01-12 (×10): 2 via RESPIRATORY_TRACT
  Filled 2020-01-07: qty 8.8

## 2020-01-07 MED ORDER — GUAIFENESIN 100 MG/5ML PO SOLN
5.0000 mL | ORAL | Status: DC | PRN
  Administered 2020-01-07 – 2020-01-11 (×4): 100 mg via ORAL
  Filled 2020-01-07 (×4): qty 10

## 2020-01-07 MED ORDER — LACTATED RINGERS IV BOLUS (SEPSIS)
1645.0000 mL | Freq: Once | INTRAVENOUS | Status: AC
  Administered 2020-01-07: 1645 mL via INTRAVENOUS

## 2020-01-07 MED ORDER — METRONIDAZOLE IN NACL 5-0.79 MG/ML-% IV SOLN
500.0000 mg | Freq: Three times a day (TID) | INTRAVENOUS | Status: DC
  Administered 2020-01-07 – 2020-01-08 (×2): 500 mg via INTRAVENOUS
  Filled 2020-01-07 (×2): qty 100

## 2020-01-07 MED ORDER — SODIUM CHLORIDE 0.9 % IV SOLN
2.0000 g | Freq: Once | INTRAVENOUS | Status: AC
  Administered 2020-01-07: 2 g via INTRAVENOUS
  Filled 2020-01-07: qty 2

## 2020-01-07 MED ORDER — IOHEXOL 300 MG/ML  SOLN
100.0000 mL | Freq: Once | INTRAMUSCULAR | Status: AC | PRN
  Administered 2020-01-07: 100 mL via INTRAVENOUS

## 2020-01-07 MED ORDER — LACTATED RINGERS IV SOLN: INTRAVENOUS | Status: DC

## 2020-01-07 MED ORDER — INSULIN ASPART 100 UNIT/ML ~~LOC~~ SOLN
0.0000 [IU] | Freq: Three times a day (TID) | SUBCUTANEOUS | Status: DC
  Administered 2020-01-08 – 2020-01-09 (×2): 2 [IU] via SUBCUTANEOUS
  Administered 2020-01-09 – 2020-01-10 (×3): 3 [IU] via SUBCUTANEOUS
  Administered 2020-01-11: 2 [IU] via SUBCUTANEOUS
  Administered 2020-01-11: 3 [IU] via SUBCUTANEOUS
  Administered 2020-01-12 (×2): 2 [IU] via SUBCUTANEOUS

## 2020-01-07 MED ORDER — VITAMIN D 25 MCG (1000 UNIT) PO TABS
1000.0000 [IU] | ORAL_TABLET | Freq: Every day | ORAL | Status: DC
  Administered 2020-01-08 – 2020-01-12 (×5): 1000 [IU] via ORAL
  Filled 2020-01-07 (×5): qty 1

## 2020-01-07 MED ORDER — MAGIC MOUTHWASH
5.0000 mL | Freq: Four times a day (QID) | ORAL | Status: DC | PRN
  Filled 2020-01-07: qty 10

## 2020-01-07 MED ORDER — TRAZODONE HCL 50 MG PO TABS
50.0000 mg | ORAL_TABLET | Freq: Every day | ORAL | Status: DC
  Administered 2020-01-07 – 2020-01-11 (×5): 50 mg via ORAL
  Filled 2020-01-07 (×5): qty 1

## 2020-01-07 MED ORDER — ONDANSETRON HCL 4 MG/2ML IJ SOLN: 4.0000 mg | Freq: Four times a day (QID) | INTRAMUSCULAR | Status: DC | PRN

## 2020-01-07 MED ORDER — ALBUTEROL SULFATE HFA 108 (90 BASE) MCG/ACT IN AERS: 1.0000 | INHALATION_SPRAY | RESPIRATORY_TRACT | Status: DC | PRN

## 2020-01-07 MED ORDER — SODIUM CHLORIDE 0.9 % IV SOLN: INTRAVENOUS | Status: DC

## 2020-01-07 MED ORDER — METRONIDAZOLE IN NACL 5-0.79 MG/ML-% IV SOLN
500.0000 mg | Freq: Once | INTRAVENOUS | Status: AC
  Administered 2020-01-07: 500 mg via INTRAVENOUS
  Filled 2020-01-07: qty 100

## 2020-01-07 MED ORDER — HEPARIN SODIUM (PORCINE) 5000 UNIT/ML IJ SOLN
5000.0000 [IU] | Freq: Three times a day (TID) | INTRAMUSCULAR | Status: DC
  Administered 2020-01-07 – 2020-01-12 (×15): 5000 [IU] via SUBCUTANEOUS
  Filled 2020-01-07 (×15): qty 1

## 2020-01-07 MED ORDER — MORPHINE SULFATE (PF) 4 MG/ML IV SOLN
4.0000 mg | Freq: Once | INTRAVENOUS | Status: AC
  Administered 2020-01-07: 4 mg via INTRAVENOUS
  Filled 2020-01-07: qty 1

## 2020-01-07 MED ORDER — ONDANSETRON HCL 4 MG PO TABS: 4.0000 mg | ORAL_TABLET | Freq: Four times a day (QID) | ORAL | Status: DC | PRN

## 2020-01-07 MED ORDER — SODIUM CHLORIDE 0.9 % IV SOLN
2.0000 g | Freq: Two times a day (BID) | INTRAVENOUS | Status: DC
  Administered 2020-01-08: 2 g via INTRAVENOUS
  Filled 2020-01-07 (×2): qty 2

## 2020-01-07 NOTE — Plan of Care (Signed)
Goals indentified

## 2020-01-07 NOTE — ED Notes (Signed)
Date and time results received: 01/07/20 1334   Test: Lactic Acid Critical Value: 2.2  Name of Provider Notified: Melina Copa MD  Orders Received? Or Actions Taken?: None at this time

## 2020-01-07 NOTE — Sepsis Progress Note (Signed)
Sepsis protocol being followed by [facility] 

## 2020-01-07 NOTE — Progress Notes (Signed)
CRITICAL VALUE ALERT  Critical Value:  Lactic acid 2.1  Date & Time Notied:  01/07/20@2225   Provider Notified: Yes  Orders Received/Actions taken:

## 2020-01-07 NOTE — Progress Notes (Signed)
Pharmacy Antibiotic Note  [name] is a 69 y.o. 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  Pharmacy has been consulted for Cefepime dosing.  Plan: Cefepime 2g IV q12h Follow up renal function, culture results, and clinical course.      Temp (24hrs), Avg:99.5 F (37.5 C), Min:97.9 F (36.6 C), Max:102.2 F (39 C)  Recent Labs  Lab 01/07/20 1229 01/07/20 1429  WBC 11.7*  --   CREATININE 1.49*  --   LATICACIDVEN 2.2* 2.0*    Estimated Creatinine Clearance: 47.7 mL/min (A) (by C-G formula based on SCr of 1.49 mg/dL (H)).    Allergies  Allergen Reactions  . Crestor [Rosuvastatin] Other (See Comments)    INTOLERANCE   . Metformin And Related Other (See Comments)    Stomach ache  . Mobic [Meloxicam] Rash    Antimicrobials this admission: 11/4 Cefepime >>  11/4 metronidazole >>   Dose adjustments this admission:   Microbiology results: 11/4 BCx: 11/4 UCx: 11/4 Resp panel:   Thank you for allowing pharmacy to be a part of this patient's care.  Gretta Arab PharmD, BCPS Clinical Pharmacist [facility] main pharmacy [phone] 01/07/2020 6:45 PM

## 2020-01-07 NOTE — H&P (Signed)
History and Physical    [name] JSH:[id number] DOB: Feb 14, 1951 DOA: 01/07/2020  PCP: Hoyt Koch, MD  Patient coming from: Home  Chief Complaint: "I started feeling sick."  HPI: [name] is a 69 y.o. male with medical history significant of HCC, right full thickness supraspinatus tear, DM2, HTN. Presenting with a fall out of bed this morning. Poor historian. He states that he fell out of bed this morning and couldn't get up. No head injury or LOC. EMS came and helped him up, but he did not initially want to go to the hospital. As the day progressed, he started feeling sick and decided he needed to come to the hospital. He called EMS to come back and he was brought to [facility]. He states that during that period right before coming to the hospital, he was "sick" and weak but can not describe it any more clearly. He did not try any medicine or other interventions for his symptoms. When asked to describe what "sick" means, he'll say he had nausea the night before, then says he didn't have it the night before that it was only today, and then says he has had no nausea. He states he can't think right now.     ED Course: He was found to have fever in the ED. He c/o ab pain at the time so imaging was acquired. CT showed cirrhosis of the liver w/ possible mets, but no ductal dilatation or other evidence of inflammation/infection. He was found to have a mildly elevated WBC and elevated lactic acid. His transaminases were elevated. He was started cefepime and flagyl. TRH was called for admission.   Review of Systems:  Denies CP, palpitations, dyspnea. Review of systems is otherwise negative for all not mentioned in HPI.   PMHx Past Medical History:  Diagnosis Date  . Alcoholism ([place])   . Arthritis    "knees, hands" (03/08/2015)  . Asthma   . Chronic bronchitis ([place])   . Chronic diastolic CHF (congestive heart failure) ([place])    notes 03/08/2015  . Cirrhosis ([place])   . COPD (chronic  obstructive pulmonary disease) ([place])   . Coronary artery disease   . Hepatitis C   . High cholesterol   . Hypertension   . Pneumonia 03/2015  . Shortness of breath dyspnea   . Type II diabetes mellitus ([place]) 2014    PSHx Past Surgical History:  Procedure Laterality Date  . CATARACT EXTRACTION Left 02/2014  . ESOPHAGOGASTRODUODENOSCOPY  02/09/2011   Procedure: ESOPHAGOGASTRODUODENOSCOPY (EGD);  Surgeon: Missy Sabins, MD;  Location: [facility] ENDOSCOPY;  Service: Endoscopy;  Laterality: N/A;  . TOTAL KNEE ARTHROPLASTY Left 10/22/2009    SocHx  reports that he quit smoking about 6 years ago. His smoking use included cigarettes. He has a 69.00 pack-year smoking history. He has never used smokeless tobacco. He reports that he does not drink alcohol and does not use drugs.  Allergies  Allergen Reactions  . Crestor [Rosuvastatin] Other (See Comments)    INTOLERANCE   . Metformin And Related Other (See Comments)    Stomach ache  . Mobic [Meloxicam] Rash    FamHx Family History  Problem Relation Age of Onset  . Stroke Mother   . Diabetes Mother   . Hypertension Mother   . Hyperlipidemia Mother   . Heart disease Father   . Hypertension Father   . Heart attack Father     Prior to Admission medications   Medication Sig Start Date End  Date Taking? Authorizing Provider  albuterol (VENTOLIN HFA) 108 (90 Base) MCG/ACT inhaler Inhale 1-2 puffs into the lungs every 4 (four) hours as needed for wheezing or shortness of breath. 01/09/16  Yes Hoyt Koch, MD  aspirin EC 81 MG tablet Take 1 tablet (81 mg total) by mouth daily. 08/31/13  Yes Advani, Vernon Prey, MD  atorvastatin (LIPITOR) 40 MG tablet TAKE 1 TABLET EVERY DAY 10/22/19  Yes Hoyt Koch, MD  betamethasone valerate ointment (VALISONE) 0.1 % APPLY ONE APPLICATION TOPICALLY TWO TIMES DAILY 12/07/19  Yes Hoyt Koch, MD  cholecalciferol (VITAMIN D) 1000 UNITS tablet Take 1 tablet (1,000 Units total) by mouth daily.  08/20/12  Yes Robbie Lis, MD  Cinnamon 500 MG capsule Take 500 mg by mouth 2 (two) times daily.    Yes [provider]  famotidine (PEPCID) 40 MG tablet Take 40 mg by mouth daily. 09/01/19  Yes [provider]  Fluticasone-Salmeterol (ADVAIR) 100-50 MCG/DOSE AEPB Inhale 1 puff into the lungs 2 (two) times daily. 07/03/19  Yes Hoyt Koch, MD  HOMEOPATHIC PRODUCTS PO Take by mouth daily. "Hyland's Leg Cramps"    Yes [provider]  hydrOXYzine (ATARAX/VISTARIL) 25 MG tablet Take 1 tablet by mouth three times daily as needed Patient taking differently: Take 25 mg by mouth every 8 (eight) hours as needed for anxiety or itching.  10/07/19  Yes Hoyt Koch, MD  insulin NPH-regular Human (NOVOLIN 70/30) (70-30) 100 UNIT/ML injection Inject 160 Units into the skin daily with breakfast. 08/26/19  Yes Renato Shin, MD  latanoprost (XALATAN) 0.005 % ophthalmic solution Place 1 drop into both eyes daily.  04/09/19  Yes [provider]  lenvatinib 4 mg daily dose (LENVIMA) capsule Take 1 capsule (4 mg total) by mouth daily. 01/05/20  Yes Ladell Pier, MD  lisinopril-hydrochlorothiazide (ZESTORETIC) 20-25 MG tablet Take 1 tablet by mouth daily. APPOINTMENT NEEDED FOR ADDITIONAL REFILLS 11/02/19  Yes Hoyt Koch, MD  magic mouthwash SOLN Take 5-10 mLs by mouth 4 (four) times daily as needed for mouth pain (Swish and swallow). 11/23/19  Yes Ladell Pier, MD  Multiple Vitamins-Minerals (MULTIVITAMIN WITH MINERALS) tablet Take 1 tablet by mouth daily.    Yes [provider]  Omega-3 Fatty Acids (FISH OIL) 1000 MG CAPS Take 2 capsules by mouth daily.    Yes [provider]  pantoprazole (PROTONIX) 40 MG tablet TAKE 1 TABLET BY MOUTH ONCE DAILY BEFORE  BREAKFAST. Patient taking differently: Take 40 mg by mouth daily as needed (heartburn).  04/16/19  Yes Armbruster, Carlota Raspberry, MD  traZODone (DESYREL) 50 MG tablet TAKE 1 TABLET AT  BEDTIME AS NEEDED FOR SLEEP Patient taking differently: Take 50 mg by mouth at bedtime.  11/20/19  Yes Biagio Borg, MD  Accu-Chek FastClix Lancets MISC 1 each by Does not apply route 4 (four) times daily. E11.9 04/16/19   Renato Shin, MD  Alcohol Swabs (B-D SINGLE USE SWABS REGULAR) PADS Use to swab finger 4 times daily. 04/13/19   Renato Shin, MD  Blood Glucose Calibration (ACCU-CHEK GUIDE CONTROL) LIQD 1 each by Other route as needed (Use to calibrate meter prn). E11.9 04/16/19   Renato Shin, MD  Blood Glucose Monitoring Suppl (ACCU-CHEK GUIDE ME) w/Device KIT 1 kit by Does not apply route See admin instructions. Check blood sugar 4 times a day 04/13/19   Renato Shin, MD  glucose blood (ACCU-CHEK GUIDE) test strip 1 each by Other route 4 (four) times daily. 04/13/19  Renato Shin, MD  HYDROcodone-acetaminophen (NORCO/VICODIN) 5-325 MG tablet Take 1 tablet by mouth every 4 (four) hours as needed for moderate pain. Patient not taking: Reported on 12/30/2019 12/04/19   Ladell Pier, MD  Insulin Syringe-Needle U-100 (BD VEO INSULIN SYRINGE U/F) 31G X 15/64" 1 ML MISC Use for injecting insulin once a day. 04/13/19   Renato Shin, MD  methocarbamol (ROBAXIN) 500 MG tablet Take 1 tablet (500 mg total) by mouth 2 (two) times daily. Patient not taking: Reported on 01/07/2020 01/20/19   Joy, Helane Gunther, PA-C  metoCLOPramide (REGLAN) 5 MG tablet Take 1 tablet (5 mg total) by mouth every 8 (eight) hours as needed for nausea. Patient not taking: Reported on 12/30/2019 01/28/19   Hoyt Koch, MD  prochlorperazine (COMPAZINE) 10 MG tablet Take 1 tablet (10 mg total) by mouth every 6 (six) hours as needed. Patient not taking: Reported on 12/30/2019 03/13/19   Ladell Pier, MD  sildenafil (VIAGRA) 100 MG tablet Take 0.5-1 tablets (50-100 mg total) by mouth daily as needed for erectile dysfunction. Patient not taking: Reported on 01/07/2020 08/12/18   Hoyt Koch, MD    Physical  Exam: Vitals:   01/07/20 1217 01/07/20 1300 01/07/20 1500  BP: 131/85 114/66 (!) 150/101  Pulse: (!) 112 (!) 102 91  Resp: 16 (!) 31 (!) 34  Temp: (!) 102.2 F (39 C)    TempSrc: Oral    SpO2: 98% 95% 100%    General: 69 y.o. male resting in bed in NAD Eyes: PERRL, normal sclera ENMT: Nares patent w/o discharge, orophaynx clear, dentition normal, ears w/o discharge/lesions/ulcers Neck: Supple, trachea midline Cardiovascular: tachy regular, +S1, S2, no m/g/r, equal pulses throughout Respiratory: CTABL, no w/r/r, normal WOB GI: BS+, ND, RUQ TTP, epigastric ttp, no masses noted, no organomegaly noted MSK: No e/c/c, limited ROM right shoulder Skin: No rashes, bruises, ulcerations noted Neuro: A&O x 3, no focal deficits Psyc: flat affect, calm/cooperative  Labs on Admission: I have personally reviewed following labs and imaging studies  CBC: Recent Labs  Lab 01/07/20 1229  WBC 11.7*  NEUTROABS 8.5*  HGB 15.0  HCT 43.3  MCV 77.3*  PLT 283*   Basic Metabolic Panel: Recent Labs  Lab 01/07/20 1229  NA 129*  K 4.4  CL 96*  CO2 20*  GLUCOSE 137*  BUN 25*  CREATININE 1.49*  CALCIUM 8.4*   GFR: Estimated Creatinine Clearance: 47.7 mL/min (A) (by C-G formula based on SCr of 1.49 mg/dL (H)). Liver Function Tests: Recent Labs  Lab 01/07/20 1229  AST 715*  ALT 163*  ALKPHOS 236*  BILITOT 4.3*  PROT 8.1  ALBUMIN 3.1*   No results for input(s): LIPASE, AMYLASE in the last 168 hours. No results for input(s): AMMONIA in the last 168 hours. Coagulation Profile: Recent Labs  Lab 01/07/20 1229  INR 1.3*   Cardiac Enzymes: No results for input(s): CKTOTAL, CKMB, CKMBINDEX, TROPONINI in the last 168 hours. BNP (last 3 results) No results for input(s): PROBNP in the last 8760 hours. HbA1C: No results for input(s): HGBA1C in the last 72 hours. CBG: No results for input(s): GLUCAP in the last 168 hours. Lipid Profile: No results for input(s): CHOL, HDL, LDLCALC,  TRIG, CHOLHDL, LDLDIRECT in the last 72 hours. Thyroid Function Tests: No results for input(s): TSH, T4TOTAL, FREET4, T3FREE, THYROIDAB in the last 72 hours. Anemia Panel: No results for input(s): VITAMINB12, FOLATE, FERRITIN, TIBC, IRON, RETICCTPCT in the last 72 hours. Urine analysis:    Component  Value Date/Time   COLORURINE YELLOW 05/28/2015 0233   APPEARANCEUR CLEAR 05/28/2015 0233   LABSPEC 1.021 05/28/2015 0233   PHURINE 5.0 05/28/2015 0233   GLUCOSEU >1000 (A) 05/28/2015 0233   HGBUR NEGATIVE 05/28/2015 0233   BILIRUBINUR NEGATIVE 05/28/2015 0233   KETONESUR 15 (A) 05/28/2015 0233   PROTEINUR NEGATIVE 11/30/2019 0833   UROBILINOGEN 1.0 01/12/2014 0911   NITRITE NEGATIVE 05/28/2015 0233   LEUKOCYTESUR NEGATIVE 05/28/2015 0233    Radiological Exams on Admission: CT Abdomen Pelvis W Contrast  Result Date: 01/07/2020 CLINICAL DATA:  Abdominal pain, fever. EXAM: CT ABDOMEN AND PELVIS WITH CONTRAST TECHNIQUE: Multidetector CT imaging of the abdomen and pelvis was performed using the standard protocol following bolus administration of intravenous contrast. CONTRAST:  133m OMNIPAQUE IOHEXOL 300 MG/ML  SOLN COMPARISON:  February 18, 2019.  October 27, 2019. FINDINGS: Lower chest: Minimal right pleural effusion is noted with adjacent subsegmental atelectasis. Hepatobiliary: No gallstones or biliary dilatation is noted. Nodular hepatic contours are noted concerning for cirrhosis. Multiple ill-defined low densities are noted throughout hepatic parenchyma consistent with the history of hepatocellular carcinoma. Pancreas: Unremarkable. No pancreatic ductal dilatation or surrounding inflammatory changes. Spleen: Normal in size without focal abnormality. Adrenals/Urinary Tract: Adrenal glands are unremarkable. Kidneys are normal, without renal calculi, focal lesion, or hydronephrosis. Bladder is unremarkable. Stomach/Bowel: Stomach is within normal limits. Appendix appears normal. No evidence of  bowel wall thickening, distention, or inflammatory changes. Vascular/Lymphatic: Aortic atherosclerosis. No enlarged abdominal or pelvic lymph nodes. Reproductive: Prostate is unremarkable. Other: No abdominal wall hernia or abnormality. No abdominopelvic ascites. Musculoskeletal: No acute or significant osseous findings. IMPRESSION: 1. Nodular hepatic contours are noted concerning for cirrhosis. Multiple ill-defined low densities are noted throughout hepatic parenchyma consistent with the history of hepatocellular carcinoma as noted on prior MRI. 2. Minimal right pleural effusion is noted with adjacent subsegmental atelectasis. 3. Aortic atherosclerosis. Aortic Atherosclerosis (ICD10-I70.0). Electronically Signed   By: JMarijo ConceptionM.D.   On: 01/07/2020 14:55   DG Chest Port 1 View  Result Date: 01/07/2020 CLINICAL DATA:  Sepsis. EXAM: PORTABLE CHEST 1 VIEW COMPARISON:  January 20, 2019. FINDINGS: The heart size and mediastinal contours are within normal limits. Both lungs are clear. The visualized skeletal structures are unremarkable. IMPRESSION: No active disease. Electronically Signed   By: JMarijo ConceptionM.D.   On: 01/07/2020 13:31    EKG: Independently reviewed. Sinus tach  Assessment/Plan Sepsis w/ unknown source RUQ abdominal pain     - admit to inpt, tele     - sepsis: temp 102.2, RR 31, HR 103, lactic acid 2.2, AKI, suspected source intra-abdominal infection     - CXR clear, UA pending; no report of diarrhea     - continue cefepime/flagyl for now; check procal     - check lipase, hep panel     - continue IVF  DM2     - SSI, DM2 diet, A1c, glucose checks  Elevated LFT HCC on chemo     - CT as above     - known history of HCC, but AST spike since 10/27     - check hep panel; rpt LFTs is AST remains elevated, MRCP?     - hold oral chemo for now d/t suspected infection     - hold statin     - per EDP, Dr. SBenay Spiceto follow in the AM.   AKI     - fluids, he is somewhat  dry  HTN     -  hold home zestoretic d/t AKI     - PRN hydralazine  HLD     - hold statin d/t elevated LFTs  COPD     - not on home O2     - continue inhalers  DVT prophylaxis: heparin  Code Status: FULL  Family Communication: None at bedside.  Consults called: None  Admission status: Inpatient  Status is: Inpatient  Remains inpatient appropriate because:Inpatient level of care appropriate due to severity of illness   Dispo: The patient is from: Home              Anticipated d/c is to: Home              Anticipated d/c date is: 3 days              Patient currently is not medically stable to d/c.  Jonnie Finner DO [facility]  If 7PM-7AM, please contact night-coverage [email or web address]  01/07/2020, 3:58 PM

## 2020-01-07 NOTE — ED Triage Notes (Signed)
Pt BIB EMS. Per EMS pt fell this morning due to being weak when trying to get out of bed. PTAR came to help pt up but pt refused to go to the hospital. Pt later on called EMS himself and decided to come to the ER. Pt complaining of right abd pain, right flank pain, burning with urination, dark urine and SOB. Pt has hx of liver cancer and COPD.

## 2020-01-07 NOTE — ED Provider Notes (Signed)
[facility] DEPT Provider Note   CSN: [id number] Arrival date & time: 01/07/20  1204     History Chief Complaint  Patient presents with  . Shortness of Breath  . Abdominal Pain    [name] is a 69 y.o. male.  He has a history of liver cancer and follows with Dr. Ammie Dalton oncology.  He said his right shoulder and neck have been bothering her for a few weeks and his doctor diagnosed him with a tear in the muscle.  He said he felt bad since last night with increased right upper quadrant pain and dark urine, shortness of breath.  Feels very weak and fell this morning and could not get out of bed.  He called the ambulance and they put him back into bed but he refused transportation to the emergency department.  He later called and requested transport to the ER for evaluation.  Denies any head pain, no posterior neck pain, some chest pain and shortness of breath, does have abdominal pain diarrhea and dark urine.  Febrile here on arrival to 102.2.  He is Covid vaccinated.  The history is provided by the patient.  Abdominal Pain Pain location:  RUQ and R flank Pain quality: aching   Pain severity:  Moderate Onset quality:  Gradual Timing:  Constant Progression:  Unchanged Chronicity:  New Relieved by:  Nothing Worsened by:  Nothing Ineffective treatments:  None tried Associated symptoms: chest pain, diarrhea, dysuria, fever and shortness of breath   Associated symptoms: no cough, no hematemesis, no hematochezia, no hematuria, no sore throat and no vomiting        Past Medical History:  Diagnosis Date  . Alcoholism ([place])   . Arthritis    "knees, hands" (03/08/2015)  . Asthma   . Chronic bronchitis ([place])   . Chronic diastolic CHF (congestive heart failure) ([place])    notes 03/08/2015  . Cirrhosis ([place])   . COPD (chronic obstructive pulmonary disease) ([place])   . Coronary artery disease   . Hepatitis C   . High cholesterol   . Hypertension   .  Pneumonia 03/2015  . Shortness of breath dyspnea   . Type II diabetes mellitus ([place]) 2014    Patient Active Problem List   Diagnosis Date Noted  . Pruritus 07/03/2019  . Cancer, hepatocellular ([place]) 03/13/2019  . Intractable hiccups 01/27/2019  . Costochondritis 01/26/2019  . Insomnia 11/06/2018  . ED (erectile dysfunction) 01/09/2016  . Hepatic cirrhosis ([place]) 08/31/2015  . Goals of care, counseling/discussion 06/24/2015  . Chronic diastolic CHF (congestive heart failure) ([place]) 03/08/2015  . Insulin dependent type 2 diabetes mellitus ([place])   . PAD (peripheral artery disease) ([place])   . Hyperlipidemia associated with type 2 diabetes mellitus ([place])   . Mild intermittent asthma 08/18/2013  . ASCVD (arteriosclerotic cardiovascular disease) 02/11/2013  . Osteoarthritis 10/27/2012  . Former smoker 09/19/2012  . Essential hypertension, benign 08/20/2012  . GERD (gastroesophageal reflux disease) 08/20/2012  . PVD (peripheral vascular disease) ([place]) 08/20/2012    Past Surgical History:  Procedure Laterality Date  . CATARACT EXTRACTION Left 02/2014  . ESOPHAGOGASTRODUODENOSCOPY  02/09/2011   Procedure: ESOPHAGOGASTRODUODENOSCOPY (EGD);  Surgeon: Missy Sabins, MD;  Location: [facility] ENDOSCOPY;  Service: Endoscopy;  Laterality: N/A;  . TOTAL KNEE ARTHROPLASTY Left 10/22/2009       Family History  Problem Relation Age of Onset  . Stroke Mother   . Diabetes Mother   . Hypertension Mother   . Hyperlipidemia Mother   .  Heart disease Father   . Hypertension Father   . Heart attack Father     Social History   Tobacco Use  . Smoking status: Former Smoker    Packs/day: 1.50    Years: 46.00    Pack years: 69.00    Types: Cigarettes    Quit date: 03/19/2013    Years since quitting: 6.8  . Smokeless tobacco: Never Used  Vaping Use  . Vaping Use: Never used  Substance Use Topics  . Alcohol use: No    Alcohol/week: 0.0 standard drinks    Comment: "recovering alcoholic; 4/31/5400"  .  Drug use: No    Home Medications Prior to Admission medications   Medication Sig Start Date End Date Taking? Authorizing Provider  Accu-Chek FastClix Lancets MISC 1 each by Does not apply route 4 (four) times daily. E11.9 04/16/19   Renato Shin, MD  albuterol (VENTOLIN HFA) 108 (90 Base) MCG/ACT inhaler Inhale 1-2 puffs into the lungs every 4 (four) hours as needed for wheezing or shortness of breath. 01/09/16   Hoyt Koch, MD  Alcohol Swabs (B-D SINGLE USE SWABS REGULAR) PADS Use to swab finger 4 times daily. 04/13/19   Renato Shin, MD  aspirin EC 81 MG tablet Take 1 tablet (81 mg total) by mouth daily. 08/31/13   Lorayne Marek, MD  atorvastatin (LIPITOR) 40 MG tablet TAKE 1 TABLET EVERY DAY 10/22/19   Hoyt Koch, MD  betamethasone valerate ointment (VALISONE) 0.1 % APPLY ONE APPLICATION TOPICALLY TWO TIMES DAILY 12/07/19   Hoyt Koch, MD  Blood Glucose Calibration (ACCU-CHEK GUIDE CONTROL) LIQD 1 each by Other route as needed (Use to calibrate meter prn). E11.9 04/16/19   Renato Shin, MD  Blood Glucose Monitoring Suppl (ACCU-CHEK GUIDE ME) w/Device KIT 1 kit by Does not apply route See admin instructions. Check blood sugar 4 times a day 04/13/19   Renato Shin, MD  cholecalciferol (VITAMIN D) 1000 UNITS tablet Take 1 tablet (1,000 Units total) by mouth daily. 08/20/12   Robbie Lis, MD  Cinnamon 500 MG capsule Take 500 mg by mouth 2 (two) times daily.     [provider]  Fluticasone-Salmeterol (ADVAIR) 100-50 MCG/DOSE AEPB Inhale 1 puff into the lungs 2 (two) times daily. 07/03/19   Hoyt Koch, MD  glucose blood (ACCU-CHEK GUIDE) test strip 1 each by Other route 4 (four) times daily. 04/13/19   Renato Shin, MD  HOMEOPATHIC PRODUCTS PO Take by mouth daily. "Hyland's Leg Cramps"     [provider]  HYDROcodone-acetaminophen (NORCO/VICODIN) 5-325 MG tablet Take 1 tablet by mouth every 4 (four) hours as needed for moderate pain. Patient  not taking: Reported on 12/30/2019 12/04/19   [place] Pier, MD  hydrOXYzine (ATARAX/VISTARIL) 25 MG tablet Take 1 tablet by mouth three times daily as needed 10/07/19   Hoyt Koch, MD  insulin NPH-regular Human (NOVOLIN 70/30) (70-30) 100 UNIT/ML injection Inject 160 Units into the skin daily with breakfast. 08/26/19   Renato Shin, MD  Insulin Syringe-Needle U-100 (BD VEO INSULIN SYRINGE U/F) 31G X 15/64" 1 ML MISC Use for injecting insulin once a day. 04/13/19   Renato Shin, MD  latanoprost (XALATAN) 0.005 % ophthalmic solution Place 1 drop into both eyes daily.  04/09/19   [provider]  lenvatinib 4 mg daily dose (LENVIMA) capsule Take 1 capsule (4 mg total) by mouth daily. 01/05/20   Ladell Pier, MD  lisinopril-hydrochlorothiazide (ZESTORETIC) 20-25 MG tablet Take 1 tablet by mouth  daily. APPOINTMENT NEEDED FOR ADDITIONAL REFILLS 11/02/19   Hoyt Koch, MD  loperamide (IMODIUM) 2 MG capsule Take 2-4 mg by mouth 4 (four) times daily as needed for diarrhea or loose stools.  Patient not taking: Reported on 12/30/2019    Ladell Pier, MD  magic mouthwash SOLN Take 5-10 mLs by mouth 4 (four) times daily as needed for mouth pain (Swish and swallow). 11/23/19   Ladell Pier, MD  methocarbamol (ROBAXIN) 500 MG tablet Take 1 tablet (500 mg total) by mouth 2 (two) times daily. 01/20/19   Joy, Shawn C, PA-C  metoCLOPramide (REGLAN) 5 MG tablet Take 1 tablet (5 mg total) by mouth every 8 (eight) hours as needed for nausea. Patient not taking: Reported on 12/30/2019 01/28/19   Hoyt Koch, MD  Multiple Vitamins-Minerals (MULTIVITAMIN WITH MINERALS) tablet Take 1 tablet by mouth daily.     [provider]  Omega-3 Fatty Acids (FISH OIL) 1000 MG CAPS Take 2 capsules by mouth daily.     [provider]  pantoprazole (PROTONIX) 40 MG tablet TAKE 1 TABLET BY MOUTH ONCE DAILY BEFORE  BREAKFAST. 04/16/19   Armbruster, Carlota Raspberry, MD  prochlorperazine  (COMPAZINE) 10 MG tablet Take 1 tablet (10 mg total) by mouth every 6 (six) hours as needed. Patient not taking: Reported on 12/30/2019 03/13/19   L[place]ell Pier, MD  sildenafil (REVATIO) 20 MG tablet TAKE 5 TABLETS BY MOUTH DAILY AS NEEDED 08/11/18   Hoyt Koch, MD  sildenafil (VIAGRA) 100 MG tablet Take 0.5-1 tablets (50-100 mg total) by mouth daily as needed for erectile dysfunction. 08/12/18   Hoyt Koch, MD  traZODone (DESYREL) 50 MG tablet TAKE 1 TABLET AT BEDTIME AS NEEDED FOR SLEEP 11/20/19   Biagio Borg, MD    Allergies    Crestor [rosuvastatin], Metformin and related, and Mobic [meloxicam]  Review of Systems   Review of Systems  Constitutional: Positive for fever.  HENT: Positive for mouth sores. Negative for sore throat.   Eyes: Negative for visual disturbance.  Respiratory: Positive for shortness of breath. Negative for cough.   Cardiovascular: Positive for chest pain.  Gastrointestinal: Positive for abdominal pain and diarrhea. Negative for hematemesis, hematochezia and vomiting.  Genitourinary: Positive for dysuria. Negative for hematuria.  Musculoskeletal: Positive for neck pain.  Skin: Negative for rash.  Neurological: Negative for headaches.    Physical Exam Updated Vital Signs BP 131/85 (BP Location: Right Arm)   Pulse (!) 112   Temp (!) 102.2 F (39 C) (Oral)   Resp 16   SpO2 98%   Physical Exam Vitals and nursing note reviewed.  Constitutional:      General: He is not in acute distress.    Appearance: He is well-developed.  HENT:     Head: Normocephalic and atraumatic.  Eyes:     Conjunctiva/sclera: Conjunctivae normal.  Cardiovascular:     Rate and Rhythm: Regular rhythm. Tachycardia present.     Heart sounds: No murmur heard.   Pulmonary:     Effort: Pulmonary effort is normal. No respiratory distress.     Breath sounds: Normal breath sounds.  Chest:     Chest wall: No tenderness.  Abdominal:     Palpations: There is mass  (ruq).     Tenderness: There is abdominal tenderness. There is no guarding or rebound.  Musculoskeletal:     Cervical back: Neck supple.     Right lower leg: No tenderness. No edema.  Left lower leg: No tenderness. No edema.     Comments: Tender right anterior shoulder into the right trapezius  Skin:    General: Skin is warm and dry.     Capillary Refill: Capillary refill takes less than 2 seconds.  Neurological:     General: No focal deficit present.     Mental Status: He is alert.     ED Results / Procedures / Treatments   Labs (all labs ordered are listed, but only abnormal results are displayed) Labs Reviewed  URINE CULTURE  CULTURE, BLOOD (ROUTINE X 2)  CULTURE, BLOOD (ROUTINE X 2)  RESPIRATORY PANEL BY RT PCR (FLU A&B, COVID)  LACTIC ACID, PLASMA  LACTIC ACID, PLASMA  COMPREHENSIVE METABOLIC PANEL  CBC WITH DIFFERENTIAL/PLATELET  PROTIME-INR  APTT  URINALYSIS, ROUTINE W REFLEX MICROSCOPIC    EKG None  Radiology No results found.  Procedures .Critical Care Performed by: Hayden Rasmussen, MD Authorized by: Hayden Rasmussen, MD   Critical care provider statement:    Critical care time (minutes):  45   Critical care time was exclusive of:  Separately billable procedures and treating other patients   Critical care was necessary to treat or prevent imminent or life-threatening deterioration of the following conditions:  Sepsis   Critical care was time spent personally by me on the following activities:  Discussions with consultants, evaluation of patient's response to treatment, examination of patient, ordering and performing treatments and interventions, ordering and review of laboratory studies, ordering and review of radiographic studies, pulse oximetry, re-evaluation of patient's condition, obtaining history from patient or surrogate, review of old charts and development of treatment plan with patient or surrogate   (including critical care  time)  Medications Ordered in ED Medications  lactated ringers infusion ( Intravenous New Bag/Given 01/07/20 1850)  aspirin EC tablet 81 mg (has no administration in time range)  traZODone (DESYREL) tablet 50 mg (50 mg Oral Given 01/07/20 2134)  magic mouthwash (has no administration in time range)  cholecalciferol (VITAMIN D3) tablet 1,000 Units (has no administration in time range)  albuterol (VENTOLIN HFA) 108 (90 Base) MCG/ACT inhaler 1-2 puff (has no administration in time range)  mometasone-formoterol (DULERA) 100-5 MCG/ACT inhaler 2 puff (2 puffs Inhalation Given 01/08/20 0824)  latanoprost (XALATAN) 0.005 % ophthalmic solution 1 drop (1 drop Both Eyes Given 01/07/20 2138)  heparin injection 5,000 Units (5,000 Units Subcutaneous Given 01/08/20 0637)  0.9 %  sodium chloride infusion ( Intravenous New Bag/Given 01/08/20 0636)  metroNIDAZOLE (FLAGYL) IVPB 500 mg (500 mg Intravenous New Bag/Given 01/08/20 0637)  ondansetron (ZOFRAN) tablet 4 mg (has no administration in time range)    Or  ondansetron (ZOFRAN) injection 4 mg (has no administration in time range)  insulin aspart (novoLOG) injection 0-15 Units (0 Units Subcutaneous Not Given 01/08/20 0955)  insulin aspart (novoLOG) injection 0-5 Units (0 Units Subcutaneous Not Given 01/07/20 2136)  hydrALAZINE (APRESOLINE) tablet 25 mg (has no administration in time range)  ceFEPIme (MAXIPIME) 2 g in sodium chloride 0.9 % 100 mL IVPB (2 g Intravenous New Bag/Given 01/08/20 0254)  guaiFENesin (ROBITUSSIN) 100 MG/5ML solution 100 mg (100 mg Oral Given 01/08/20 0641)  oxyCODONE (Oxy IR/ROXICODONE) immediate release tablet 5-10 mg (10 mg Oral Given 01/08/20 0954)  lactated ringers bolus 1,000 mL (0 mLs Intravenous Stopped 01/07/20 1408)  acetaminophen (TYLENOL) tablet 650 mg (650 mg Oral Given 01/07/20 1250)  morphine 4 MG/ML injection 4 mg (4 mg Intravenous Given 01/07/20 1251)  ceFEPIme (MAXIPIME) 2 g in  sodium chloride 0.9 % 100 mL IVPB (0 g Intravenous  Stopped 01/07/20 1510)    And  metroNIDAZOLE (FLAGYL) IVPB 500 mg (0 mg Intravenous Stopped 01/07/20 1510)  lactated ringers bolus 1,645 mL (1,645 mLs Intravenous New Bag/Given 01/07/20 1406)  iohexol (OMNIPAQUE) 300 MG/ML solution 100 mL (100 mLs Intravenous Contrast Given 01/07/20 1417)  morphine 4 MG/ML injection 4 mg (4 mg Intravenous Given 01/08/20 0341)    ED Course  I have reviewed the triage vital signs and the nursing notes.  Pertinent labs & imaging results that were available during my care of the patient were reviewed by me and considered in my medical decision making (see chart for details).  Clinical Course as of Jan 08 1008  Thu Jan 07, 2020  1305 Chest x-ray interpreted by me as elevated right hemidiaphragm no acute infiltrates.   [MB]  2956 Discussed with Dr. Benay Spice oncology.  He agrees with current work-up and says the patient does not have any biliary stenting.  He says he might have some biliary obstruction due to his tumor burden would benefit from a CT and antibiotics.   [MB]  1507 Discussed with [facility] hospitalist Dr. Marylyn Ishihara who will evaluate the patient for admission.   [MB]    Clinical Course User Index [MB] Hayden Rasmussen, MD   MDM Rules/Calculators/A&P                         This patient complains of right upper quadrant pain right flank pain: this involves an extensive number of treatment Options and is a complaint that carries with it a high risk of complications and Morbidity. The differential includes sepsis, Sirs, pyelonephritis, infected ureteral stone, ascending cholangitis, pneumonia;   I ordered, reviewed and interpreted labs, which included CBC with mild elevated white count, stable hemoglobin's, chemistries markedly elevated with low bicarb elevated BUN/creatinine some dehydration, elevated LFTs worsened from prior consistent with his liver malignancy, elevated lactate I ordered medication IV fluids IV antibiotics for management of sepsis I ordered  imaging studies which included chest x-ray and CT abdomen and pelvis and I independently    visualized and interpreted imaging which showed no acute infiltrates, hepatic nodules consistent with cirrhosis, Previous records obtained and reviewed in epic including recent oncology visit with Dr. Benay Spice I consulted oncology Dr.Sherril and  And [facility] hospitalist Dr. Marylyn Ishihara discussed lab and imaging findings  Critical Interventions: Recognition and management of sepsis with aggressive fluids antibiotics  After the interventions stated above, I reevaluated the patient and found patient to be hemodynamically improved and pain is also improved.   Final Clinical Impression(s) / ED Diagnoses Final diagnoses:  Sepsis, due to unspecified organism, unspecified whether acute organ dysfunction present [facility])  Right upper quadrant abdominal pain  Hepatocellular carcinoma ([place])    Rx / DC Orders ED Discharge Orders    None       Hayden Rasmussen, MD 01/08/20 1015

## 2020-01-08 ENCOUNTER — Inpatient Hospital Stay (HOSPITAL_COMMUNITY): Payer: Medicare HMO

## 2020-01-08 DIAGNOSIS — E119 Type 2 diabetes mellitus without complications: Secondary | ICD-10-CM

## 2020-01-08 DIAGNOSIS — R748 Abnormal levels of other serum enzymes: Secondary | ICD-10-CM

## 2020-01-08 DIAGNOSIS — R5381 Other malaise: Secondary | ICD-10-CM

## 2020-01-08 DIAGNOSIS — Y92009 Unspecified place in unspecified non-institutional (private) residence as the place of occurrence of the external cause: Secondary | ICD-10-CM

## 2020-01-08 DIAGNOSIS — W19XXXA Unspecified fall, initial encounter: Secondary | ICD-10-CM

## 2020-01-08 DIAGNOSIS — K7469 Other cirrhosis of liver: Secondary | ICD-10-CM

## 2020-01-08 DIAGNOSIS — I10 Essential (primary) hypertension: Secondary | ICD-10-CM

## 2020-01-08 DIAGNOSIS — K7031 Alcoholic cirrhosis of liver with ascites: Secondary | ICD-10-CM

## 2020-01-08 DIAGNOSIS — R109 Unspecified abdominal pain: Secondary | ICD-10-CM

## 2020-01-08 DIAGNOSIS — Z794 Long term (current) use of insulin: Secondary | ICD-10-CM

## 2020-01-08 DIAGNOSIS — Z8619 Personal history of other infectious and parasitic diseases: Secondary | ICD-10-CM

## 2020-01-08 DIAGNOSIS — R6510 Systemic inflammatory response syndrome (SIRS) of non-infectious origin without acute organ dysfunction: Secondary | ICD-10-CM

## 2020-01-08 DIAGNOSIS — C220 Liver cell carcinoma: Secondary | ICD-10-CM | POA: Diagnosis not present

## 2020-01-08 DIAGNOSIS — T796XXA Traumatic ischemia of muscle, initial encounter: Secondary | ICD-10-CM

## 2020-01-08 LAB — MAGNESIUM: Magnesium: 1.8 mg/dL (ref 1.7–2.4)

## 2020-01-08 LAB — COMPREHENSIVE METABOLIC PANEL
ALT: 101 U/L — ABNORMAL HIGH (ref 0–44)
AST: 297 U/L — ABNORMAL HIGH (ref 15–41)
Albumin: 2.3 g/dL — ABNORMAL LOW (ref 3.5–5.0)
Alkaline Phosphatase: 160 U/L — ABNORMAL HIGH (ref 38–126)
Anion gap: 10 (ref 5–15)
BUN: 19 mg/dL (ref 8–23)
CO2: 21 mmol/L — ABNORMAL LOW (ref 22–32)
Calcium: 7.5 mg/dL — ABNORMAL LOW (ref 8.9–10.3)
Chloride: 96 mmol/L — ABNORMAL LOW (ref 98–111)
Creatinine, Ser: 1.34 mg/dL — ABNORMAL HIGH (ref 0.61–1.24)
GFR, Estimated: 58 mL/min — ABNORMAL LOW (ref >60)
Glucose, Bld: 147 mg/dL — ABNORMAL HIGH (ref 70–99)
Potassium: 4.4 mmol/L (ref 3.5–5.1)
Sodium: 127 mmol/L — ABNORMAL LOW (ref 135–145)
Total Bilirubin: 3.4 mg/dL — ABNORMAL HIGH (ref 0.3–1.2)
Total Protein: 6.4 g/dL — ABNORMAL LOW (ref 6.5–8.1)

## 2020-01-08 LAB — CORTISOL-AM, BLOOD: Cortisol - AM: 23.2 ug/dL — ABNORMAL HIGH (ref 6.7–22.6)

## 2020-01-08 LAB — PROCALCITONIN: Procalcitonin: 6.05 ng/mL

## 2020-01-08 LAB — GLUCOSE, CAPILLARY
Glucose-Capillary: 119 mg/dL — ABNORMAL HIGH (ref 70–99)
Glucose-Capillary: 107 mg/dL — ABNORMAL HIGH (ref 70–99)
Glucose-Capillary: 133 mg/dL — ABNORMAL HIGH (ref 70–99)
Glucose-Capillary: 127 mg/dL — ABNORMAL HIGH (ref 70–99)

## 2020-01-08 LAB — CBC
HCT: 36.6 % — ABNORMAL LOW (ref 39.0–52.0)
Hemoglobin: 12.1 g/dL — ABNORMAL LOW (ref 13.0–17.0)
MCH: 25.9 pg — ABNORMAL LOW (ref 26.0–34.0)
MCHC: 33.1 g/dL (ref 30.0–36.0)
MCV: 78.4 fL — ABNORMAL LOW (ref 80.0–100.0)
Platelets: 409 10*3/uL — ABNORMAL HIGH (ref 150–400)
RBC: 4.67 MIL/uL (ref 4.22–5.81)
RDW: 15.2 % (ref 11.5–15.5)
WBC: 8.5 10*3/uL (ref 4.0–10.5)
nRBC: 0 % (ref 0.0–0.2)

## 2020-01-08 LAB — CK: Total CK: 771 U/L — ABNORMAL HIGH (ref 49–397)

## 2020-01-08 LAB — PHOSPHORUS: Phosphorus: 2.8 mg/dL (ref 2.5–4.6)

## 2020-01-08 LAB — PROTIME-INR
INR: 1.3 — ABNORMAL HIGH (ref 0.8–1.2)
Prothrombin Time: 16 seconds — ABNORMAL HIGH (ref 11.4–15.2)

## 2020-01-08 MED ORDER — METRONIDAZOLE 500 MG PO TABS
500.0000 mg | ORAL_TABLET | Freq: Three times a day (TID) | ORAL | Status: DC
  Administered 2020-01-08 – 2020-01-09 (×3): 500 mg via ORAL
  Filled 2020-01-08 (×3): qty 1

## 2020-01-08 MED ORDER — SODIUM CHLORIDE 0.9 % IV SOLN
2.0000 g | Freq: Two times a day (BID) | INTRAVENOUS | Status: DC
  Administered 2020-01-08 – 2020-01-09 (×2): 2 g via INTRAVENOUS
  Filled 2020-01-08 (×2): qty 2

## 2020-01-08 MED ORDER — OXYCODONE HCL 5 MG PO TABS: 5.0000 mg | ORAL_TABLET | Freq: Four times a day (QID) | ORAL | Status: DC | PRN

## 2020-01-08 MED ORDER — OXYCODONE HCL 5 MG PO TABS
5.0000 mg | ORAL_TABLET | ORAL | Status: DC | PRN
  Administered 2020-01-08: 10 mg via ORAL
  Filled 2020-01-08: qty 2

## 2020-01-08 MED ORDER — MORPHINE SULFATE (PF) 4 MG/ML IV SOLN
4.0000 mg | Freq: Once | INTRAVENOUS | Status: AC
  Administered 2020-01-08: 4 mg via INTRAVENOUS
  Filled 2020-01-08: qty 1

## 2020-01-08 MED ORDER — DIPHENHYDRAMINE HCL 25 MG PO CAPS
25.0000 mg | ORAL_CAPSULE | Freq: Three times a day (TID) | ORAL | Status: DC | PRN
  Administered 2020-01-08 – 2020-01-09 (×3): 25 mg via ORAL
  Filled 2020-01-08 (×4): qty 1

## 2020-01-08 MED ORDER — OXYCODONE HCL 5 MG PO TABS
5.0000 mg | ORAL_TABLET | ORAL | Status: DC | PRN
  Administered 2020-01-08 – 2020-01-09 (×2): 10 mg via ORAL
  Administered 2020-01-09 (×2): 5 mg via ORAL
  Administered 2020-01-10 – 2020-01-12 (×8): 10 mg via ORAL
  Filled 2020-01-08 (×3): qty 2
  Filled 2020-01-08: qty 1
  Filled 2020-01-08 (×8): qty 2

## 2020-01-08 MED ORDER — GLUCERNA SHAKE PO LIQD
237.0000 mL | Freq: Three times a day (TID) | ORAL | Status: DC
  Administered 2020-01-08 – 2020-01-12 (×11): 237 mL via ORAL
  Filled 2020-01-08 (×14): qty 237

## 2020-01-08 MED ORDER — SODIUM CHLORIDE 0.9 % IV SOLN
2.0000 g | Freq: Three times a day (TID) | INTRAVENOUS | Status: DC
  Filled 2020-01-08: qty 2

## 2020-01-08 MED ORDER — FENTANYL CITRATE (PF) 100 MCG/2ML IJ SOLN
50.0000 ug | INTRAMUSCULAR | Status: DC | PRN
  Administered 2020-01-08 (×2): 50 ug via INTRAVENOUS
  Filled 2020-01-08 (×2): qty 2

## 2020-01-08 MED ORDER — PROCHLORPERAZINE MALEATE 10 MG PO TABS
10.0000 mg | ORAL_TABLET | Freq: Four times a day (QID) | ORAL | Status: DC | PRN
  Administered 2020-01-08 – 2020-01-09 (×4): 10 mg via ORAL
  Filled 2020-01-08 (×5): qty 1

## 2020-01-08 NOTE — Progress Notes (Signed)
Initial Nutrition Assessment  DOCUMENTATION CODES:   Not applicable (pending current weight)  INTERVENTION:  Glucerna Shake po TID, each supplement provides 220 kcal and 10 grams of protein  No new wt this admission, will order daily weights  Contacted [facility] for assistance with ordering pt meals   NUTRITION DIAGNOSIS:   Increased nutrient needs related to acute illness (sepsis suspected intra-abdominal source) as evidenced by estimated needs.    GOAL:   Patient will meet greater than or equal to 90% of their needs    MONITOR:   Labs, I & O's, Supplement acceptance, PO intake, Weight trends  REASON FOR ASSESSMENT:   Malnutrition Screening Tool    ASSESSMENT:  RD working remotely.  69 year old male with history significant of HCC, right full thickness supraspinatus tear, DM2, HTN, COPD, chronic dCHF, CAD, HTN, HLD, Hepatitis C, EtOH abuse, and cirrhosis presents after falling out of bed with weakness, abdominal pain with associated nausea.  Pt noted poor historian. In ED, pt found to have fever, CT showed cirrhosis of liver with possible mets, mildly elevated WBC and lactic acid as well as elevated transaminases. He has been admitted for sepsis of unknown source, suspected intra-abdominal infection.   Spoke with pt via phone this morning, reports feeling weak. He states he has not eaten today and has been waiting forever for his breakfast tray to arrive. Patient reports his dinner order was incorrect last night, says he ordered a salad, however he only received a packet of dressing and nothing else. Reviewed order in [facility], pt ordered [place] sandwich, chips, piece of cake, side salad with 2 ranch and a gingerale. No breakfast tray delivered this morning, missed meal reported for lunch. RD contacted [facility], representative will call pt to get order. Patient reports good appetite at baseline, RD unable to obtain good history. Recalls [place] sausage for breakfast, different  things for lunch and dinner, snacks sometimes. Pt unable to elaborate, says he weak and not sure right now. He is amenable to drinking Glucerna Shakes to aid with meeting needs.   Per chart, weights stable (195-200 lbs) over the past 3 months. No new weight this admission, noted 88.8 kg on 12/30/19. Will order daily weights and reassess estimated needs as appropriate.   I/Os: +1228.8 ml since admit UOP: 350 ml since admit  Medications reviewed and include: D3, SSI, Flagyl, Maxipime IVF: NaCl @ 125 ml/hr  Labs: CBGs 119,148 Na 127 (L), Cr 1.34 (H), Alkaline phosphatase 160 (H), AST 297 (H), ALT 101 (H), Total bilirubin 3.4 (H) 01/07/20 A1c 7.1  NUTRITION - FOCUSED PHYSICAL EXAM: Unable to complete at this time, RD working remotely.  Diet Order:   Diet Order            Diet Carb Modified Fluid consistency: Thin; Room service appropriate? Yes  Diet effective now                 EDUCATION NEEDS:   No education needs have been identified at this time  Skin:  Skin Assessment: Skin Integrity Issues: Skin Integrity Issues:: Other (Comment) Other: abraison; right arm, back, leg  Last BM:  11/4  Height:   Ht Readings from Last 1 Encounters:  12/30/19 5\' 4"  (1.626 m)    Weight:   Wt Readings from Last 1 Encounters:  12/30/19 88.8 kg    BMI:  There is no height or weight on file to calculate BMI.  Estimated Nutritional Needs:   Kcal:  2050-2300  Protein:  105-115  Fluid:  >/= 2L/day   Lajuan Lines, RD, LDN Clinical Nutrition After Hours/Weekend Pager # in [place]

## 2020-01-08 NOTE — Progress Notes (Signed)
PHARMACY NOTE:  ANTIMICROBIAL RENAL DOSAGE ADJUSTMENT  Current antimicrobial regimen includes a mismatch between antimicrobial dosage and estimated renal function.  As per policy approved by the Pharmacy & Therapeutics and Medical Executive Committees, the antimicrobial dosage will be adjusted accordingly.  Current antimicrobial dosage:  Cefepime 2 g  Iv q 12 hours  Indication: IAI  Renal Function:  Estimated Creatinine Clearance: 53 mL/min (A) (by C-G formula based on SCr of 1.34 mg/dL (H)). []      On intermittent HD, scheduled: []      On CRRT    Antimicrobial dosage has been changed to:  Cefepime 2 g iv q 12 hours  Additional comments:   Thank you for allowing pharmacy to be a part of this patient's care.  Napoleon Form, [facility] 01/08/2020 12:20 PM

## 2020-01-08 NOTE — Progress Notes (Addendum)
HEMATOLOGY-ONCOLOGY PROGRESS NOTE  SUBJECTIVE: [name] was admitted to the hospital following a fall at home.  He was febrile in the emergency room.  CT abdomen/pelvis shows cirrhosis and his known [place].  No source of infection was seen.  Blood cultures negative to date.  Urine culture in process.  The patient has been having abdominal pain and is receiving IV pain medication.  Oncology History  Cancer, hepatocellular ([place])  03/13/2019 Initial Diagnosis   Cancer, hepatocellular ([place])   03/20/2019 -  Chemotherapy   The patient had atezolizumab (TECENTRIQ) 1,200 mg in sodium chloride 0.9 % 250 mL chemo infusion, 1,200 mg, Intravenous, Once, 10 of 12 cycles Administration: 1,200 mg (03/20/2019), 1,200 mg (04/24/2019), 1,200 mg (05/15/2019), 1,200 mg (06/05/2019), 1,200 mg (07/17/2019), 1,200 mg (06/25/2019), 1,200 mg (08/07/2019), 1,200 mg (08/28/2019), 1,200 mg (09/18/2019), 1,200 mg (10/09/2019) bevacizumab-bvzr (ZIRABEV) 1,300 mg in sodium chloride 0.9 % 100 mL chemo infusion, 15 mg/kg = 1,300 mg, Intravenous,  Once, 10 of 12 cycles Administration: 1,300 mg (03/20/2019), 1,300 mg (04/24/2019), 1,300 mg (05/15/2019), 1,300 mg (06/05/2019), 1,300 mg (07/17/2019), 1,300 mg (06/25/2019), 1,300 mg (08/07/2019), 1,300 mg (08/28/2019), 1,300 mg (09/18/2019), 1,300 mg (10/09/2019)  for chemotherapy treatment.     PHYSICAL EXAMINATION:  Vitals:   01/08/20 0633 01/08/20 0825  BP: (!) 156/75   Pulse: 94   Resp: 19   Temp: 99.2 F (37.3 C)   SpO2: 96% 97%   There were no vitals filed for this visit.  Intake/Output from previous day: 11/04 0701 - 11/05 0700 In: 1228.8 [I.V.:1028.8; IV Piggyback:200] Out: -   GENERAL:alert, no distress and comfortable OROPHARYNX: No thrush LUNGS: clear to auscultation and percussion with normal breathing effort HEART: regular rate & rhythm and no murmurs and no lower extremity edema ABDOMEN: Positive bowel sounds, soft, tenderness with palpation NEURO: alert & oriented x 3 with fluent  speech, no focal motor/sensory deficits Musculoskeletal: Tender at the lateral left thigh  LABORATORY DATA:  I have reviewed the data as listed CMP Latest Ref Rng & Units 01/08/2020 01/07/2020 01/07/2020  Glucose 70 - 99 mg/dL 147(H) - 137(H)  BUN 8 - 23 mg/dL 19 - 25(H)  Creatinine 0.61 - 1.24 mg/dL 1.34(H) 1.23 1.49(H)  Sodium 135 - 145 mmol/L 127(L) - 129(L)  Potassium 3.5 - 5.1 mmol/L 4.4 - 4.4  Chloride 98 - 111 mmol/L 96(L) - 96(L)  CO2 22 - 32 mmol/L 21(L) - 20(L)  Calcium 8.9 - 10.3 mg/dL 7.5(L) - 8.4(L)  Total Protein 6.5 - 8.1 g/dL 6.4(L) 6.9 8.1  Total Bilirubin 0.3 - 1.2 mg/dL 3.4(H) 3.7(H) 4.3(H)  Alkaline Phos 38 - 126 U/L 160(H) 201(H) 236(H)  AST 15 - 41 U/L 297(H) 483(H) 715(H)  ALT 0 - 44 U/L 101(H) 129(H) 163(H)    Lab Results  Component Value Date   WBC 8.5 01/08/2020   HGB 12.1 (L) 01/08/2020   HCT 36.6 (L) 01/08/2020   MCV 78.4 (L) 01/08/2020   PLT 409 (H) 01/08/2020   NEUTROABS 8.5 (H) 01/07/2020    CT Humerus Right Wo Contrast  Result Date: 12/10/2019 CLINICAL DATA:  Right shoulder and upper arm pain for the past month. History of hepatocellular carcinoma. EXAM: CT OF THE RIGHT HUMERUS WITHOUT CONTRAST TECHNIQUE: Multidetector CT imaging was performed according to the standard protocol. Multiplanar CT image reconstructions were also generated. COMPARISON:  Right shoulder and humerus x-rays dated November 23, 2019. FINDINGS: Bones/Joint/Cartilage There is a 3 x 4 x 9 mm oval lucent lesion in the posterior cortex  of the proximal humeral metaphysis (series 7, image 52; series 8, image 52). No additional focal bone lesion. No fracture or dislocation. Moderate acromioclavicular osteoarthritis. Mild glenohumeral and elbow osteoarthritis. No joint effusion. Ligaments Ligaments are suboptimally evaluated by CT. Muscles and Tendons Suspected full-thickness supraspinatus tear with mild muscle atrophy. Soft tissue No fluid collection or hematoma.  No soft tissue mass.  Incidentally noted nodular liver contour consistent with history of cirrhosis. IMPRESSION: 1. Suspected full-thickness supraspinatus tear with mild muscle atrophy. Consider further evaluation with MRI. 2. 9 mm oval lucent lesion in the posterior cortex of the proximal humeral metaphysis is nonspecific, but less likely to represent metastatic disease. This could be further evaluated with with a right shoulder MRI as well. 3. Moderate acromioclavicular and mild glenohumeral osteoarthritis. Electronically Signed   By: Titus Dubin M.D.   On: 12/10/2019 10:22   CT Abdomen Pelvis W Contrast  Result Date: 01/07/2020 CLINICAL DATA:  Abdominal pain, fever. EXAM: CT ABDOMEN AND PELVIS WITH CONTRAST TECHNIQUE: Multidetector CT imaging of the abdomen and pelvis was performed using the standard protocol following bolus administration of intravenous contrast. CONTRAST:  110mL OMNIPAQUE IOHEXOL 300 MG/ML  SOLN COMPARISON:  February 18, 2019.  October 27, 2019. FINDINGS: Lower chest: Minimal right pleural effusion is noted with adjacent subsegmental atelectasis. Hepatobiliary: No gallstones or biliary dilatation is noted. Nodular hepatic contours are noted concerning for cirrhosis. Multiple ill-defined low densities are noted throughout hepatic parenchyma consistent with the history of hepatocellular carcinoma. Pancreas: Unremarkable. No pancreatic ductal dilatation or surrounding inflammatory changes. Spleen: Normal in size without focal abnormality. Adrenals/Urinary Tract: Adrenal glands are unremarkable. Kidneys are normal, without renal calculi, focal lesion, or hydronephrosis. Bladder is unremarkable. Stomach/Bowel: Stomach is within normal limits. Appendix appears normal. No evidence of bowel wall thickening, distention, or inflammatory changes. Vascular/Lymphatic: Aortic atherosclerosis. No enlarged abdominal or pelvic lymph nodes. Reproductive: Prostate is unremarkable. Other: No abdominal wall hernia or abnormality.  No abdominopelvic ascites. Musculoskeletal: No acute or significant osseous findings. IMPRESSION: 1. Nodular hepatic contours are noted concerning for cirrhosis. Multiple ill-defined low densities are noted throughout hepatic parenchyma consistent with the history of hepatocellular carcinoma as noted on prior MRI. 2. Minimal right pleural effusion is noted with adjacent subsegmental atelectasis. 3. Aortic atherosclerosis. Aortic Atherosclerosis (ICD10-I70.0). Electronically Signed   By: Marijo Conception M.D.   On: 01/07/2020 14:55   DG Chest Port 1 View  Result Date: 01/07/2020 CLINICAL DATA:  Sepsis. EXAM: PORTABLE CHEST 1 VIEW COMPARISON:  January 20, 2019. FINDINGS: The heart size and mediastinal contours are within normal limits. Both lungs are clear. The visualized skeletal structures are unremarkable. IMPRESSION: No active disease. Electronically Signed   By: Marijo Conception M.D.   On: 01/07/2020 13:31    ASSESSMENT AND PLAN: 1. Hepatocellular carcinoma  02/12/2019 AFP 10,200  02/18/2019 CT abdomen-dominant poorly marginated heterogeneously enhancing 8.6 x 7.7 cm posterior right liver lobe mass, new compared to CT study 09/10/2016. Numerous similar-appearing liver masses scattered throughout the liver all new since 09/10/2016 CT. Diffusely irregular liver surface compatible with cirrhosis. Spleen normal size. No ascites. No abdominal adenopathy.  MRI liver 03/05/2019-multifocal hepatic metastases including a 10 cm lesion in the right liver,Li-rads 5, no evidence of metastatic disease  Cycle 1 atezolizumab/bevacizumab 03/20/2019  Cycle 2 atezolizumab/bevacizumab 04/24/2019  Cycle 3 atezolizumab/bevacizumab 05/15/2019  Cycle 4 atezolizumab/bevacizumab 06/05/2019  MRI liver 06/24/2019-multifocal hepatocellular carcinoma, lesions stable to decreased in size. No evidence for disease progression. Cirrhosis.  Cycle 5 atezolizumab/bevacizumab 06/25/2019  Cycle  6 atezolizumab/bevacizumab  07/17/2019  Cycle 7 atezolizumab/bevacizumab 08/07/2019  Cycle 8 atezolizumab/bevacizumab 08/28/2019  Cycle 9atezolizumab/bevacizumab 09/18/2019  Cycle 10 atezolizumab/bevacizumab 10/09/2019  MRI liver 10/27/2019-enlargement of multifocal liver lesions, cirrhosis  Lenvatinib beginning 11/10/2019, held starting 11/23/2019 secondary to diarrhea and mucositis  Lenvatinib resumed at 4 mg daily beginning 12/01/2019 2. 01/20/2019 chest CT-no definite evidence of pulmonary embolus. Wall thickening of the distal esophagus. Nodular hepatic contours. 3. Cirrhosis  4. Hepatitis C status post [place] 5. Type 2 diabetes 6. COPD 7. CAD 8. Chronic diastolic heart failure 9. Hypertension 10. Positive Cologuard 2019 11. Diarrhea and mucositis 11/23/2019 12. Dehydration secondary to diarrhea 11/23/2019 13. Right arm/shoulder pain-plain x-ray right shoulder with slight irregularity inferior glenoid, no fracture or dislocation; right humerus with mild degenerative changes in the acromioclavicular joint, thinning humeral joint, no acute fracture or dislocation and no lytic or sclerotic lesion noted.Pain resolved 11/30/2019.  CT 12/10/2019-suspected full-thickness supraspinatus tear with mild muscle atrophy.  3 x 4 x 9 mm oval lucent lesion in the posterior cortex of the proximal humeral metaphysis, nonspecific, unlikely to represent metastatic disease; moderate acromioclavicular and mild glenohumeral osteoarthritis. [facility] admission 01/07/2020-sepsis   [name] has been admitted to the hospital with sepsis.  Source of infection is unclear.  Had a low-grade fever overnight.  Blood cultures negative to date and urine culture pending.  No source of infection identified on CT of the abdomen/pelvis.  He is on IV antibiotics.  Currently receiving IV pain medication and will add oxycodone.  Recommendations: 1.  Hold lenvatinib for now pending work-up and treatment of sepsis 2.  Continue IV antibiotics.  Follow  cultures. 3.  Add oxycodone 1-2 tablet every 4 hours as needed for pain. 4.  X-ray left femur secondary to pain following fall  Call medical oncology as needed for questions.    LOS: 1 day   Kyle Bussing, DNP, AGPCNP-BC, AOCNP 01/08/20 Mr. Dery was interviewed and examined.  He was admitted yesterday after a fall with acute onset generalized weakness.  He also reported dyspnea.  He has severe pain in the right subcostal region.  The pain is most likely related to the hepatic tumor burden.  The fever could be related to tumor fever, but the acute onset suggest an infection.  There is no clear source for infection, but I suspect a GI source related to the hepatic tumor burden. I have a low clinical suspicion for a pulmonary embolism.  Lenvatinib will be placed on hold.  Please call oncology over the weekend as needed.  I will check on him 01/11/2020.

## 2020-01-08 NOTE — Progress Notes (Signed)
PROGRESS NOTE  [name] QMV:[id number] DOB: 01/08/1951   PCP: [name], MD  Patient is from: Home  DOA: 01/07/2020 LOS: 1  Chief complaints: "Sick"  Brief Narrative / Interim history: 69 year old male with history of HCC s/p chemo currently on lenvatinib and followed by [name], alcoholism, hep C status post Harvoni, IDDM-2, PAD, COPD, HTN, and right supraspinatus muscle tear brought to ED by EMS after fall from his bed and "feeling sick".  Has nausea and right-sided abdominal pain. No head impact or LOC after fall.  In ED, febrile to 102.2.  Tachycardic to 113.  Normotensive.  98% on RA.  WBC 11.7 with some left shift. LA 2.2> 2.0. NA 129. Cr 1.49 (b/l 1.0-1.1).  ALP 236.  AST 515.  ALT 163.  APTT/PT/INR 39/15.3/1.3..  RVP negative.  UA negative.  CXR without acute finding.  CT abdomen and pelvis with contrast concerning for cirrhosis and HCC.  Cultures obtained.  Started on cefepime and Flagyl, and admitted for SIRS  Subjective: Continues to complain of right-sided abdominal and chest pain.  Also reports LLE pain.  Denies nausea or vomiting.  Denies cough, shortness of breath, diarrhea or UTI symptoms.  Objective: Vitals:   01/07/20 2117 01/08/20 0248 01/08/20 0633 01/08/20 0825  BP: (!) 148/63 (!) 142/82 (!) 156/75   Pulse: 100 100 94   Resp: 20 19 19    Temp: 98.6 F (37 C) 100.2 F (37.9 C) 99.2 F (37.3 C)   TempSrc: Oral Oral Oral   SpO2: 95% 96% 96% 97%    Intake/Output Summary (Last 24 hours) at 01/08/2020 1313 Last data filed at 01/08/2020 0817 Gross per 24 hour  Intake 1228.79 ml  Output 350 ml  Net 878.79 ml   There were no vitals filed for this visit.  Examination:  GENERAL: No apparent distress.  Nontoxic. HEENT: MMM.  Vision and hearing grossly intact.  NECK: Supple.  No apparent JVD.  RESP: On RA.  No IWOB.  Fair aeration bilaterally. CVS:  RRR. Heart sounds normal.  ABD/GI/GU: BS+.  Slightly distended.  RUQ tenderness with  positive Murphy.  No ascites. MSK/EXT:  Moves extremities. No apparent deformity. No edema.  No calf tenderness. SKIN: no apparent skin lesion or wound NEURO: Awake, alert and oriented appropriately.  No apparent focal neuro deficit. PSYCH: Calm. Normal affect.  Procedures:  None  Microbiology summarized: RVP negative. Blood cultures pending. Urine culture pending.  Assessment & Plan: SIRS with lactic acidosis: Unclear source of infection so far. Could SIRS be due to malignancy or rhabdo.  Has no respiratory symptoms.  CXR reassuring.  RVP and UA unremarkable.  CT A/P and RUQ US shows known HCC and cirrhosis but no acute finding.  LFT elevated but improving.  -Continue cefepime and Flagyl pending cultures. -Follow blood and urine cultures  Right-sided chest and abdominal pain: Due to [facility]?  CT and RUQ ultrasound revealed HCC and cirrhosis but no other finding.  LFT elevated but improving.  Pattern consistent with rhabdo or alcohol.  CK elevated to 771.  He has no respiratory distress, tachypnea tachycardia to suspect PE. -Continue monitoring.  -Pain control with as needed oxycodone and fentanyl  Fall at home: Reportedly fell out of bed. No head impact or LOC Rhabdomyolysis: Traumatic from fall?  CK elevated to 771. -Continue IV fluid and monitor CK -PT/OT eval -Pain control as above  Elevated liver enzymes: Pattern consistent with rhabdo.  Denies alcohol.  HIV and acute hepatitis panel negative except for  HCV antibody.  Improving. -Continue monitoring -Hold home statin.  Coagulopathy: Likely due to cirrhosis/hepatocellular carcinoma.  No evidence of bleeding. -Continue monitoring  Uncontrolled IDDM-2: A1c 7.1%.  CBG within fair range. Recent Labs  Lab 01/07/20 2114 01/08/20 0813 01/08/20 1243  GLUCAP 148* 119* 107*  -Continue current regimen  Hepatocellular carcinoma: Followed by oncology, Dr. [name] -Oncology following.  Lenvatinib on hold pending sepsis  work-up  AKI: Likely due to rhabdomyolysis.  Improving. -Continue monitoring  Essential hypertension: BP slightly elevated. -Continue holding home lisinopril/HCTZ -On low-dose metoprolol  HLD: -Hold statin in the setting of elevated LFT  Chronic COPD: Not on oxygen at home. -Continue home medications  Right rotator cuff injury: History of right supraspinatus injury.  Stable.  Fair range of motion in right shoulder. -Pain control -PT/OT  There is no height or weight on file to calculate BMI. Nutrition Problem: Increased nutrient needs Etiology: acute illness (sepsis suspected intra-abdominal source) Signs/Symptoms: estimated needs Interventions: Glucerna shake   DVT prophylaxis:  heparin injection 5,000 Units Start: 01/07/20 2200  Code Status: Full code Family Communication: Updated patient's sister over the phone. Status is: Inpatient  Remains inpatient appropriate because:Ongoing diagnostic testing needed not appropriate for outpatient work up, IV treatments appropriate due to intensity of illness or inability to take PO and Inpatient level of care appropriate due to severity of illness   Dispo: The patient is from: Home              Anticipated d/c is to: To be determined              Anticipated d/c date is: 3 days              Patient currently is not medically stable to d/c.    Consultants:  Oncology   Sch Meds:  Scheduled Meds: . aspirin EC  81 mg Oral Daily  . cholecalciferol  1,000 Units Oral Daily  . feeding supplement (GLUCERNA SHAKE)  237 mL Oral TID BM  . heparin  5,000 Units Subcutaneous Q8H  . insulin aspart  0-15 Units Subcutaneous TID WC  . insulin aspart  0-5 Units Subcutaneous QHS  . latanoprost  1 drop Both Eyes QHS  . metroNIDAZOLE  500 mg Oral Q8H  . mometasone-formoterol  2 puff Inhalation BID  . traZODone  50 mg Oral QHS   Continuous Infusions: . sodium chloride 125 mL/hr at 01/08/20 0636  . ceFEPime (MAXIPIME) IV 2 g (01/08/20  1304)   PRN Meds:.albuterol, fentaNYL (SUBLIMAZE) injection, guaiFENesin, hydrALAZINE, magic mouthwash, ondansetron **OR** ondansetron (ZOFRAN) IV, oxyCODONE, prochlorperazine  Antimicrobials: Anti-infectives (From admission, onward)   Start     Dose/Rate Route Frequency Ordered Stop   01/08/20 1400  metroNIDAZOLE (FLAGYL) tablet 500 mg        500 mg Oral Every 8 hours 01/08/20 1105     01/08/20 1400  ceFEPIme (MAXIPIME) 2 g in sodium chloride 0.9 % 100 mL IVPB  Status:  Discontinued        2 g 200 mL/hr over 30 Minutes Intravenous Every 8 hours 01/08/20 1219 01/08/20 1225   01/08/20 1400  ceFEPIme (MAXIPIME) 2 g in sodium chloride 0.9 % 100 mL IVPB        2 g 200 mL/hr over 30 Minutes Intravenous Every 12 hours 01/08/20 1225     01/08/20 0200  ceFEPIme (MAXIPIME) 2 g in sodium chloride 0.9 % 100 mL IVPB  Status:  Discontinued        2 g 200  mL/hr over 30 Minutes Intravenous Every 12 hours 01/07/20 1848 01/08/20 1219   01/07/20 2200  metroNIDAZOLE (FLAGYL) IVPB 500 mg  Status:  Discontinued        500 mg 100 mL/hr over 60 Minutes Intravenous Every 8 hours 01/07/20 1824 01/08/20 1105   01/07/20 1430  metroNIDAZOLE (FLAGYL) IVPB 500 mg       "And" Linked Group Details   500 mg 100 mL/hr over 60 Minutes Intravenous  Once 01/07/20 1327 01/07/20 1510   01/07/20 1400  ceFEPIme (MAXIPIME) 2 g in sodium chloride 0.9 % 100 mL IVPB       "And" Linked Group Details   2 g 200 mL/hr over 30 Minutes Intravenous  Once 01/07/20 1327 01/07/20 1510       I have personally reviewed the following labs and images: CBC: Recent Labs  Lab 01/07/20 1229 01/07/20 1847 01/08/20 0722  WBC 11.7* 10.4 8.5  NEUTROABS 8.5*  --   --   HGB 15.0 13.9 12.1*  HCT 43.3 41.0 36.6*  MCV 77.3* 78.5* 78.4*  PLT 510* 438* 409*   BMP &GFR Recent Labs  Lab 01/07/20 1229 01/07/20 1847 01/08/20 0722  NA 129*  --  127*  K 4.4  --  4.4  CL 96*  --  96*  CO2 20*  --  21*  GLUCOSE 137*  --  147*  BUN 25*  --   19  CREATININE 1.49* 1.23 1.34*  CALCIUM 8.4*  --  7.5*  MG  --   --  1.8  PHOS  --   --  2.8   Estimated Creatinine Clearance: 53 mL/min (A) (by C-G formula based on SCr of 1.34 mg/dL (H)). Liver & Pancreas: Recent Labs  Lab 01/07/20 1229 01/07/20 1847 01/08/20 0722  AST 715* 483* 297*  ALT 163* 129* 101*  ALKPHOS 236* 201* 160*  BILITOT 4.3* 3.7* 3.4*  PROT 8.1 6.9 6.4*  ALBUMIN 3.1* 2.7* 2.3*   Recent Labs  Lab 01/07/20 1847  LIPASE 15   No results for input(s): AMMONIA in the last 168 hours. Diabetic: Recent Labs    01/07/20 1847  HGBA1C 7.1*   Recent Labs  Lab 01/07/20 2114 01/08/20 0813 01/08/20 1243  GLUCAP 148* 119* 107*   Cardiac Enzymes: Recent Labs  Lab 01/08/20 0722  CKTOTAL 771*   No results for input(s): PROBNP in the last 8760 hours. Coagulation Profile: Recent Labs  Lab 01/07/20 1229 01/08/20 0439  INR 1.3* 1.3*   Thyroid Function Tests: No results for input(s): TSH, T4TOTAL, FREET4, T3FREE, THYROIDAB in the last 72 hours. Lipid Profile: No results for input(s): CHOL, HDL, LDLCALC, TRIG, CHOLHDL, LDLDIRECT in the last 72 hours. Anemia Panel: No results for input(s): VITAMINB12, FOLATE, FERRITIN, TIBC, IRON, RETICCTPCT in the last 72 hours. Urine analysis:    Component Value Date/Time   COLORURINE YELLOW 01/07/2020 1530   APPEARANCEUR CLEAR 01/07/2020 1530   LABSPEC 1.027 01/07/2020 1530   PHURINE 6.0 01/07/2020 1530   GLUCOSEU NEGATIVE 01/07/2020 1530   HGBUR NEGATIVE 01/07/2020 1530   BILIRUBINUR NEGATIVE 01/07/2020 1530   KETONESUR NEGATIVE 01/07/2020 1530   PROTEINUR NEGATIVE 01/07/2020 1530   UROBILINOGEN 1.0 01/12/2014 0911   NITRITE NEGATIVE 01/07/2020 1530   LEUKOCYTESUR NEGATIVE 01/07/2020 1530   Sepsis Labs: Invalid input(s): PROCALCITONIN, LACTICIDVEN  Microbiology: Recent Results (from the past 240 hour(s))  Blood Culture (routine x 2)     Status: None (Preliminary result)   Collection Time: 01/07/20 12:30 PM    Specimen:  BLOOD  Result Value Ref Range Status   Specimen Description   Final    BLOOD RIGHT ANTECUBITAL Performed at [facility], [address], [place], [place] [address]    Special Requests   Final    BOTTLES DRAWN AEROBIC AND ANAEROBIC Blood Culture adequate volume Performed at [facility], [address], [place], [place] [address]    Culture   Final    NO GROWTH < 12 HOURS Performed at [facility], [address] [place], [place] [address]    Report Status PENDING  Incomplete  Respiratory Panel by RT PCR (Flu A&B, Covid) - Nasopharyngeal Swab     Status: None   Collection Time: 01/07/20 12:30 PM   Specimen: Nasopharyngeal Swab  Result Value Ref Range Status   SARS Coronavirus 2 by RT PCR NEGATIVE NEGATIVE Final    Comment: (NOTE) SARS-CoV-2 target nucleic acids are NOT DETECTED.  The SARS-CoV-2 RNA is generally detectable in upper respiratoy specimens during the acute phase of infection. The lowest concentration of SARS-CoV-2 viral copies this assay can detect is 131 copies/mL. A negative result does not preclude SARS-Cov-2 infection and should not be used as the sole basis for treatment or other patient management decisions. A negative result may occur with  improper specimen collection/handling, submission of specimen other than nasopharyngeal swab, presence of viral mutation(s) within the areas targeted by this assay, and inadequate number of viral copies (<131 copies/mL). A negative result must be combined with clinical observations, patient history, and epidemiological information. The expected result is Negative.  Fact Sheet for Patients:  https://www.moore.com/  Fact Sheet for Healthcare Providers:  https://www.young.biz/  This test is no t yet approved or cleared by the [place] FDA and  has been authorized for detection and/or diagnosis of SARS-CoV-2 by FDA under  an Emergency Use Authorization (EUA). This EUA will remain  in effect (meaning this test can be used) for the duration of the COVID-19 declaration under Section 564(b)(1) of the Act, 21 U.S.C. section 360bbb-3(b)(1), unless the authorization is terminated or revoked sooner.     Influenza A by PCR NEGATIVE NEGATIVE Final   Influenza B by PCR NEGATIVE NEGATIVE Final    Comment: (NOTE) The Xpert Xpress SARS-CoV-2/FLU/RSV assay is intended as an aid in  the diagnosis of influenza from Nasopharyngeal swab specimens and  should not be used as a sole basis for treatment. Nasal washings and  aspirates are unacceptable for Xpert Xpress SARS-CoV-2/FLU/RSV  testing.  Fact Sheet for Patients: https://www.moore.com/  Fact Sheet for Healthcare Providers: https://www.young.biz/  This test is not yet approved or cleared by the [place] FDA and  has been authorized for detection and/or diagnosis of SARS-CoV-2 by  FDA under an Emergency Use Authorization (EUA). This EUA will remain  in effect (meaning this test can be used) for the duration of the  Covid-19 declaration under Section 564(b)(1) of the Act, 21  U.S.C. section 360bbb-3(b)(1), unless the authorization is  terminated or revoked. Performed at [facility], [address] [place], [place] [address]   Blood Culture (routine x 2)     Status: None (Preliminary result)   Collection Time: 01/07/20 12:35 PM   Specimen: BLOOD  Result Value Ref Range Status   Specimen Description   Final    BLOOD LEFT HAND Performed at [facility], [address], [place], [place] [address]    Special Requests   Final    BOTTLES DRAWN  AEROBIC AND ANAEROBIC Blood Culture results may not be optimal due to an inadequate volume of blood received in culture bottles Performed at [facility], [address] [place], [place] [address]    Culture   Final    NO  GROWTH < 12 HOURS Performed at [facility], [address] [place], [place] [address]    Report Status PENDING  Incomplete    Radiology Studies: CT Abdomen Pelvis W Contrast  Result Date: 01/07/2020 CLINICAL DATA:  Abdominal pain, fever. EXAM: CT ABDOMEN AND PELVIS WITH CONTRAST TECHNIQUE: Multidetector CT imaging of the abdomen and pelvis was performed using the standard protocol following bolus administration of intravenous contrast. CONTRAST:  OMNIPAQUE IOHEXOL 300 MG/ML  SOLN COMPARISON:  February 18, 2019.  October 27, 2019. FINDINGS: Lower chest: Minimal right pleural effusion is noted with adjacent subsegmental atelectasis. Hepatobiliary: No gallstones or biliary dilatation is noted. Nodular hepatic contours are noted concerning for cirrhosis. Multiple ill-defined low densities are noted throughout hepatic parenchyma consistent with the history of hepatocellular carcinoma. Pancreas: Unremarkable. No pancreatic ductal dilatation or surrounding inflammatory changes. Spleen: Normal in size without focal abnormality. Adrenals/Urinary Tract: Adrenal glands are unremarkable. Kidneys are normal, without renal calculi, focal lesion, or hydronephrosis. Bladder is unremarkable. Stomach/Bowel: Stomach is within normal limits. Appendix appears normal. No evidence of bowel wall thickening, distention, or inflammatory changes. Vascular/Lymphatic: Aortic atherosclerosis. No enlarged abdominal or pelvic lymph nodes. Reproductive: Prostate is unremarkable. Other: No abdominal wall hernia or abnormality. No abdominopelvic ascites. Musculoskeletal: No acute or significant osseous findings. IMPRESSION: 1. Nodular hepatic contours are noted concerning for cirrhosis. Multiple ill-defined low densities are noted throughout hepatic parenchyma consistent with the history of hepatocellular carcinoma as noted on prior MRI. 2. Minimal right pleural effusion is noted with adjacent subsegmental atelectasis. 3. Aortic  atherosclerosis. Aortic Atherosclerosis (ICD10-I70.0). Electronically Signed   By: Lupita Raider M.D.   On: 01/07/2020 14:55   US ABDOMEN LIMITED RUQ (LIVER/GB)  Result Date: 01/08/2020 CLINICAL DATA:  Right upper quadrant pain EXAM: ULTRASOUND ABDOMEN LIMITED RIGHT UPPER QUADRANT COMPARISON:  Abdomen and pelvis CT from yesterday FINDINGS: Gallbladder: No gallstones or wall thickening visualized. No sonographic Murphy sign noted by sonographer. Common bile duct: Diameter: 3 mm Liver: Cirrhotic liver morphology including capsular nodularity. There is diffuse heterogeneity from hepatocellular disease and masses by prior CT and MRI. Portal vein is patent on color Doppler imaging with normal direction of blood flow towards the liver. IMPRESSION: 1. Negative gallbladder. 2. Known cirrhosis and hepatocellular carcinoma. Electronically Signed   By: Marnee Spring M.D.   On: 01/08/2020 11:01     Kamiah Fite T. Keaunna Skipper [facility]  If 7PM-7AM, please contact night-coverage [email or web address] 01/08/2020, 1:13 PM

## 2020-01-08 NOTE — TOC Progression Note (Signed)
Transition of Care [facility]) - Progression Note    Patient Details  Name: [name] MRN: [id number] Date of Birth: Jul 29, 1950  Transition of Care [facility]) CM/SW Contact  Purcell Mouton, RN Phone Number: 01/08/2020, 4:18 PM  Clinical Narrative:    Pt from home alone. TOC will continue to follow.   Expected Discharge Plan: Home/Self Care Barriers to Discharge: No Barriers Identified  Expected Discharge Plan and Services Expected Discharge Plan: Home/Self Care       Living arrangements for the past 2 months: Single Family Home                                       Social Determinants of Health (SDOH) Interventions    Readmission Risk Interventions No flowsheet data found.

## 2020-01-09 ENCOUNTER — Inpatient Hospital Stay (HOSPITAL_COMMUNITY): Payer: Medicare HMO

## 2020-01-09 ENCOUNTER — Encounter (HOSPITAL_COMMUNITY): Payer: Self-pay | Admitting: Internal Medicine

## 2020-01-09 DIAGNOSIS — B952 Enterococcus as the cause of diseases classified elsewhere: Secondary | ICD-10-CM

## 2020-01-09 DIAGNOSIS — N390 Urinary tract infection, site not specified: Secondary | ICD-10-CM

## 2020-01-09 LAB — MAGNESIUM: Magnesium: 2.1 mg/dL (ref 1.7–2.4)

## 2020-01-09 LAB — COMPREHENSIVE METABOLIC PANEL
ALT: 83 U/L — ABNORMAL HIGH (ref 0–44)
AST: 184 U/L — ABNORMAL HIGH (ref 15–41)
Albumin: 2.5 g/dL — ABNORMAL LOW (ref 3.5–5.0)
Alkaline Phosphatase: 180 U/L — ABNORMAL HIGH (ref 38–126)
Anion gap: 12 (ref 5–15)
BUN: 24 mg/dL — ABNORMAL HIGH (ref 8–23)
CO2: 19 mmol/L — ABNORMAL LOW (ref 22–32)
Calcium: 7.6 mg/dL — ABNORMAL LOW (ref 8.9–10.3)
Chloride: 100 mmol/L (ref 98–111)
Creatinine, Ser: 1.24 mg/dL (ref 0.61–1.24)
GFR, Estimated: >60 mL/min (ref >60)
Glucose, Bld: 99 mg/dL (ref 70–99)
Potassium: 4.2 mmol/L (ref 3.5–5.1)
Sodium: 131 mmol/L — ABNORMAL LOW (ref 135–145)
Total Bilirubin: 3.3 mg/dL — ABNORMAL HIGH (ref 0.3–1.2)
Total Protein: 6.9 g/dL (ref 6.5–8.1)

## 2020-01-09 LAB — GLUCOSE, CAPILLARY
Glucose-Capillary: 91 mg/dL (ref 70–99)
Glucose-Capillary: 135 mg/dL — ABNORMAL HIGH (ref 70–99)
Glucose-Capillary: 162 mg/dL — ABNORMAL HIGH (ref 70–99)
Glucose-Capillary: 188 mg/dL — ABNORMAL HIGH (ref 70–99)

## 2020-01-09 LAB — CBC WITH DIFFERENTIAL/PLATELET
Abs Immature Granulocytes: 0.07 10*3/uL (ref 0.00–0.07)
Basophils Absolute: 0.1 10*3/uL (ref 0.0–0.1)
Basophils Relative: 1 %
Eosinophils Absolute: 0.1 10*3/uL (ref 0.0–0.5)
Eosinophils Relative: 1 %
HCT: 35.9 % — ABNORMAL LOW (ref 39.0–52.0)
Hemoglobin: 12 g/dL — ABNORMAL LOW (ref 13.0–17.0)
Immature Granulocytes: 1 %
Lymphocytes Relative: 14 %
Lymphs Abs: 1.5 10*3/uL (ref 0.7–4.0)
MCH: 26.1 pg (ref 26.0–34.0)
MCHC: 33.4 g/dL (ref 30.0–36.0)
MCV: 78.2 fL — ABNORMAL LOW (ref 80.0–100.0)
Monocytes Absolute: 1.5 10*3/uL — ABNORMAL HIGH (ref 0.1–1.0)
Monocytes Relative: 14 %
Neutro Abs: 7.6 10*3/uL (ref 1.7–7.7)
Neutrophils Relative %: 69 %
Platelets: 448 10*3/uL — ABNORMAL HIGH (ref 150–400)
RBC: 4.59 MIL/uL (ref 4.22–5.81)
RDW: 15.5 % (ref 11.5–15.5)
WBC: 10.8 10*3/uL — ABNORMAL HIGH (ref 4.0–10.5)
nRBC: 0 % (ref 0.0–0.2)

## 2020-01-09 LAB — PROTIME-INR
INR: 1.3 — ABNORMAL HIGH (ref 0.8–1.2)
Prothrombin Time: 15.4 seconds — ABNORMAL HIGH (ref 11.4–15.2)

## 2020-01-09 LAB — URINE CULTURE: Culture: 7000 — AB

## 2020-01-09 LAB — D-DIMER, QUANTITATIVE: D-Dimer, Quant: 6.55 ug/mL-FEU — ABNORMAL HIGH (ref 0.00–0.50)

## 2020-01-09 LAB — PHOSPHORUS: Phosphorus: 2.7 mg/dL (ref 2.5–4.6)

## 2020-01-09 LAB — BRAIN NATRIURETIC PEPTIDE: B Natriuretic Peptide: 64 pg/mL (ref 0.0–100.0)

## 2020-01-09 LAB — CK: Total CK: 303 U/L (ref 49–397)

## 2020-01-09 LAB — APTT: aPTT: 55 seconds — ABNORMAL HIGH (ref 24–36)

## 2020-01-09 MED ORDER — SODIUM CHLORIDE 0.9 % IV SOLN
3.0000 g | Freq: Four times a day (QID) | INTRAVENOUS | Status: DC
  Administered 2020-01-09 – 2020-01-12 (×13): 3 g via INTRAVENOUS
  Filled 2020-01-09: qty 3
  Filled 2020-01-09 (×2): qty 8
  Filled 2020-01-09 (×2): qty 3
  Filled 2020-01-09: qty 8
  Filled 2020-01-09 (×3): qty 3
  Filled 2020-01-09 (×2): qty 8
  Filled 2020-01-09 (×3): qty 3
  Filled 2020-01-09: qty 8

## 2020-01-09 MED ORDER — IOHEXOL 350 MG/ML SOLN
100.0000 mL | Freq: Once | INTRAVENOUS | Status: AC | PRN
  Administered 2020-01-09: 100 mL via INTRAVENOUS

## 2020-01-09 MED ORDER — LISINOPRIL 20 MG PO TABS
20.0000 mg | ORAL_TABLET | Freq: Every day | ORAL | Status: DC
  Administered 2020-01-09 – 2020-01-12 (×4): 20 mg via ORAL
  Filled 2020-01-09 (×4): qty 1

## 2020-01-09 NOTE — Progress Notes (Signed)
PROGRESS NOTE  [name]:[id number] DOB: 11/05/1950   PCP: [name], MD  Patient is from: Home  DOA: 01/07/2020 LOS: 2  Chief complaints: "Sick"  Brief Narrative / Interim history: 69 year old male with history of HCC s/p chemo currently on lenvatinib and followed by [name], alcoholism, hep C status post Harvoni, IDDM-2, PAD, COPD, HTN, and right supraspinatus muscle tear brought to ED by EMS after fall from his bed and "feeling sick".  Has nausea and right-sided abdominal pain. No head impact or LOC after fall.  In ED, febrile to 102.2.  Tachycardic to 113.  Normotensive.  98% on RA.  WBC 11.7 with some left shift. LA 2.2> 2.0. NA 129. Cr 1.49 (b/l 1.0-1.1).  ALP 236.  AST 515.  ALT 163.  APTT/PT/INR 39/15.3/1.3..  RVP negative.  UA negative.  CXR without acute finding.  CT abdomen and pelvis with contrast concerning for cirrhosis and HCC.  Cultures obtained.  Started on cefepime and Flagyl, and admitted for SIRS without clear source of infection.  Blood cultures negative.  Urine culture with 7000 colonies of Enterococcus faecalis.  Antibiotic deescalated to Unasyn.  Subjective: Seen and examined earlier this morning.  No major events overnight of this morning.  Still with significant right-sided chest and abdominal pain and lateral left thigh pain.  He also reports pain in his right leg and he cannot.  Denies cough or shortness of breath.  Denies nausea or vomiting.  Objective: Vitals:   01/08/20 2051 01/08/20 2100 01/09/20 0519 01/09/20 0805  BP: (!) 168/86  (!) 158/75   Pulse: (!) 101  93   Resp: 20 (!) 23 20   Temp: 98.8 F (37.1 C)  99.5 F (37.5 C)   TempSrc: Oral  Oral   SpO2: 96%  98% 99%    Intake/Output Summary (Last 24 hours) at 01/09/2020 1109 Last data filed at 01/09/2020 0500 Gross per 24 hour  Intake 1356.12 ml  Output 1000 ml  Net 356.12 ml   There were no vitals filed for this visit.  Examination:  GENERAL: No apparent distress.   Nontoxic. HEENT: MMM.  Vision and hearing grossly intact.  NECK: Supple.  No apparent JVD.  RESP:  No IWOB.  Fair aeration bilaterally. CVS:  RRR. Heart sounds normal.  ABD/GI/GU: BS+. Abd soft.  RUQ tenderness.  MSK/EXT:  Moves extremities.  Tenderness over right lateral chest wall.  SKIN: no apparent skin lesion or wound NEURO: Awake, alert and oriented appropriately.  No apparent focal neuro deficit. PSYCH: Calm. Normal affect.   Procedures:  None  Microbiology summarized: RVP negative. Blood cultures pending. Urine culture pending.  Assessment & Plan: Severe sepsis due to Enterococcus UTI: POA: Meets criteria with SIRS (fever, tachycardia and tachypnea) and lactic acidosis, AKI and elevated liver enzymes.  Urine culture with Enterococcus faecalis.  Blood cultures NGTD. -IV cefepime and IV Flagyl 11/4-11/6 -De-escalate to IV Unasyn 11/6>>>   Hepatocellular carcinoma/liver cirrhosis: Followed by oncology, Dr. [name] Right-sided abdominal, chest and neck pain-suspect this is related to [facility] and possible diaphragmatic irritation.  He also have hiccups.  LFTs likely due to rhabdo and improving.  Peripheral PEs are difficult to rule out but his chest pain is reproducible suggesting musculoskeletal etiology.  No respiratory distress to suggest large significant PE.  Hemodynamically stable as well. -Oncology following.  Lenvatinib on hold pending sepsis work-up -Antibiotic coverage as above. -Pain control with as needed oxycodone and fentanyl -Continue Compazine for hiccups  Fall at home: Reportedly  fell out of bed and landed on carpeted floor. No head impact or LOC.  Rhabdomyolysis: Likely traumatic from fall.  CK 771>> 303. -Decrease IV fluid to 50 cc an hour -PT/OT eval -Pain control as above  Elevated liver enzymes: Pattern consistent with rhabdo.  Denies alcohol.  HIV and acute hepatitis panel negative except for HCV antibody.  Improving. -Continue monitoring -Hold home  statin.  Coagulopathy: Likely due to cirrhosis/hepatocellular carcinoma.  No evidence of bleeding. -Continue monitoring  Uncontrolled IDDM-2: A1c 7.1%.  CBG within fair range. Recent Labs  Lab 01/08/20 0813 01/08/20 1243 01/08/20 1711 01/08/20 2049 01/09/20 0733  GLUCAP 119* 107* 133* 127* 91  -Continue current regimen  AKI: Likely due to rhabdomyolysis.  Resolving. -Continue monitoring  Essential hypertension: BP slightly elevated.  On lisinopril/HCTZ at home. -Resume home lisinopril.  Hold HCTZ. -Decrease IV fluid as above  Hyperlipidemia: -Hold statin in the setting of elevated LFT  Chronic COPD: Not on oxygen at home. -Continue home medications  Right rotator cuff injury: History of right supraspinatus injury.  Stable.  Fair range of motion in right shoulder. -Pain control -PT/OT  Nutritional needs: There is no height or weight on file to calculate BMI. Nutrition Problem: Increased nutrient needs Etiology: acute illness (sepsis suspected intra-abdominal source) Signs/Symptoms: estimated needs Interventions: Glucerna shake   DVT prophylaxis:  heparin injection 5,000 Units Start: 01/07/20 2200  Code Status: Full code Family Communication: Updated patient's sister over the phone. Status is: Inpatient  Remains inpatient appropriate because:Ongoing diagnostic testing needed not appropriate for outpatient work up, IV treatments appropriate due to intensity of illness or inability to take PO and Inpatient level of care appropriate due to severity of illness   Dispo: The patient is from: Home              Anticipated d/c is to: Home              Anticipated d/c date is: 2 days              Patient currently is not medically stable to d/c.    Consultants:  Oncology   Sch Meds:  Scheduled Meds: . aspirin EC  81 mg Oral Daily  . cholecalciferol  1,000 Units Oral Daily  . feeding supplement (GLUCERNA SHAKE)  237 mL Oral TID BM  . heparin  5,000 Units  Subcutaneous Q8H  . insulin aspart  0-15 Units Subcutaneous TID WC  . insulin aspart  0-5 Units Subcutaneous QHS  . latanoprost  1 drop Both Eyes QHS  . mometasone-formoterol  2 puff Inhalation BID  . traZODone  50 mg Oral QHS   Continuous Infusions: . sodium chloride 125 mL/hr at 01/09/20 0129  . ampicillin-sulbactam (UNASYN) IV 3 g (01/09/20 0901)   PRN Meds:.albuterol, diphenhydrAMINE, fentaNYL (SUBLIMAZE) injection, guaiFENesin, hydrALAZINE, magic mouthwash, ondansetron **OR** ondansetron (ZOFRAN) IV, oxyCODONE, prochlorperazine  Antimicrobials: Anti-infectives (From admission, onward)   Start     Dose/Rate Route Frequency Ordered Stop   01/09/20 0800  Ampicillin-Sulbactam (UNASYN) 3 g in sodium chloride 0.9 % 100 mL IVPB        3 g 200 mL/hr over 30 Minutes Intravenous Every 6 hours 01/09/20 0653     01/08/20 1400  metroNIDAZOLE (FLAGYL) tablet 500 mg  Status:  Discontinued        500 mg Oral Every 8 hours 01/08/20 1105 01/09/20 0653   01/08/20 1400  ceFEPIme (MAXIPIME) 2 g in sodium chloride 0.9 % 100 mL IVPB  Status:  Discontinued  2 g 200 mL/hr over 30 Minutes Intravenous Every 8 hours 01/08/20 1219 01/08/20 1225   01/08/20 1400  ceFEPIme (MAXIPIME) 2 g in sodium chloride 0.9 % 100 mL IVPB  Status:  Discontinued        2 g 200 mL/hr over 30 Minutes Intravenous Every 12 hours 01/08/20 1225 01/09/20 0653   01/08/20 0200  ceFEPIme (MAXIPIME) 2 g in sodium chloride 0.9 % 100 mL IVPB  Status:  Discontinued        2 g 200 mL/hr over 30 Minutes Intravenous Every 12 hours 01/07/20 1848 01/08/20 1219   01/07/20 2200  metroNIDAZOLE (FLAGYL) IVPB 500 mg  Status:  Discontinued        500 mg 100 mL/hr over 60 Minutes Intravenous Every 8 hours 01/07/20 1824 01/08/20 1105   01/07/20 1430  metroNIDAZOLE (FLAGYL) IVPB 500 mg       "And" Linked Group Details   500 mg 100 mL/hr over 60 Minutes Intravenous  Once 01/07/20 1327 01/07/20 1510   01/07/20 1400  ceFEPIme (MAXIPIME) 2 g in  sodium chloride 0.9 % 100 mL IVPB       "And" Linked Group Details   2 g 200 mL/hr over 30 Minutes Intravenous  Once 01/07/20 1327 01/07/20 1510       I have personally reviewed the following labs and images: CBC: Recent Labs  Lab 01/07/20 1229 01/07/20 1847 01/08/20 0722 01/09/20 0558  WBC 11.7* 10.4 8.5 10.8*  NEUTROABS 8.5*  --   --  7.6  HGB 15.0 13.9 12.1* 12.0*  HCT 43.3 41.0 36.6* 35.9*  MCV 77.3* 78.5* 78.4* 78.2*  PLT 510* 438* 409* 448*   BMP &GFR Recent Labs  Lab 01/07/20 1229 01/07/20 1847 01/08/20 0722 01/09/20 0558  NA 129*  --  127* 131*  K 4.4  --  4.4 4.2  CL 96*  --  96* 100  CO2 20*  --  21* 19*  GLUCOSE 137*  --  147* 99  BUN 25*  --  19 24*  CREATININE 1.49* 1.23 1.34* 1.24  CALCIUM 8.4*  --  7.5* 7.6*  MG  --   --  1.8 2.1  PHOS  --   --  2.8 2.7   Estimated Creatinine Clearance: 57.3 mL/min (by C-G formula based on SCr of 1.24 mg/dL). Liver & Pancreas: Recent Labs  Lab 01/07/20 1229 01/07/20 1847 01/08/20 0722 01/09/20 0558  AST 715* 483* 297* 184*  ALT 163* 129* 101* 83*  ALKPHOS 236* 201* 160* 180*  BILITOT 4.3* 3.7* 3.4* 3.3*  PROT 8.1 6.9 6.4* 6.9  ALBUMIN 3.1* 2.7* 2.3* 2.5*   Recent Labs  Lab 01/07/20 1847  LIPASE 15   No results for input(s): AMMONIA in the last 168 hours. Diabetic: Recent Labs    01/07/20 1847  HGBA1C 7.1*   Recent Labs  Lab 01/08/20 0813 01/08/20 1243 01/08/20 1711 01/08/20 2049 01/09/20 0733  GLUCAP 119* 107* 133* 127* 91   Cardiac Enzymes: Recent Labs  Lab 01/08/20 0722 01/09/20 0558  CKTOTAL 771* 303   No results for input(s): PROBNP in the last 8760 hours. Coagulation Profile: Recent Labs  Lab 01/07/20 1229 01/08/20 0439 01/09/20 0558  INR 1.3* 1.3* 1.3*   Thyroid Function Tests: No results for input(s): TSH, T4TOTAL, FREET4, T3FREE, THYROIDAB in the last 72 hours. Lipid Profile: No results for input(s): CHOL, HDL, LDLCALC, TRIG, CHOLHDL, LDLDIRECT in the last 72  hours. Anemia Panel: No results for input(s): VITAMINB12, FOLATE, FERRITIN, TIBC, IRON,  RETICCTPCT in the last 72 hours. Urine analysis:    Component Value Date/Time   COLORURINE YELLOW 01/07/2020 1530   APPEARANCEUR CLEAR 01/07/2020 1530   LABSPEC 1.027 01/07/2020 1530   PHURINE 6.0 01/07/2020 1530   GLUCOSEU NEGATIVE 01/07/2020 1530   HGBUR NEGATIVE 01/07/2020 1530   BILIRUBINUR NEGATIVE 01/07/2020 1530   KETONESUR NEGATIVE 01/07/2020 1530   PROTEINUR NEGATIVE 01/07/2020 1530   UROBILINOGEN 1.0 01/12/2014 0911   NITRITE NEGATIVE 01/07/2020 1530   LEUKOCYTESUR NEGATIVE 01/07/2020 1530   Sepsis Labs: Invalid input(s): PROCALCITONIN, LACTICIDVEN  Microbiology: Recent Results (from the past 240 hour(s))  Blood Culture (routine x 2)     Status: None (Preliminary result)   Collection Time: 01/07/20 12:30 PM   Specimen: BLOOD  Result Value Ref Range Status   Specimen Description   Final    BLOOD RIGHT ANTECUBITAL Performed at [facility], [address] [place], [place] [address]    Special Requests   Final    BOTTLES DRAWN AEROBIC AND ANAEROBIC Blood Culture adequate volume Performed at [facility], [address], [place], [place] [address]    Culture   Final    NO GROWTH 2 DAYS Performed at [facility], [address] [place], [place] [address]    Report Status PENDING  Incomplete  Respiratory Panel by RT PCR (Flu A&B, Covid) - Nasopharyngeal Swab     Status: None   Collection Time: 01/07/20 12:30 PM   Specimen: Nasopharyngeal Swab  Result Value Ref Range Status   SARS Coronavirus 2 by RT PCR NEGATIVE NEGATIVE Final    Comment: (NOTE) SARS-CoV-2 target nucleic acids are NOT DETECTED.  The SARS-CoV-2 RNA is generally detectable in upper respiratoy specimens during the acute phase of infection. The lowest concentration of SARS-CoV-2 viral copies this assay can detect is 131 copies/mL. A negative result does not preclude  SARS-Cov-2 infection and should not be used as the sole basis for treatment or other patient management decisions. A negative result may occur with  improper specimen collection/handling, submission of specimen other than nasopharyngeal swab, presence of viral mutation(s) within the areas targeted by this assay, and inadequate number of viral copies (<131 copies/mL). A negative result must be combined with clinical observations, patient history, and epidemiological information. The expected result is Negative.  Fact Sheet for Patients:  https://www.moore.com/  Fact Sheet for Healthcare Providers:  https://www.young.biz/  This test is no t yet approved or cleared by the [place] FDA and  has been authorized for detection and/or diagnosis of SARS-CoV-2 by FDA under an Emergency Use Authorization (EUA). This EUA will remain  in effect (meaning this test can be used) for the duration of the COVID-19 declaration under Section 564(b)(1) of the Act, 21 U.S.C. section 360bbb-3(b)(1), unless the authorization is terminated or revoked sooner.     Influenza A by PCR NEGATIVE NEGATIVE Final   Influenza B by PCR NEGATIVE NEGATIVE Final    Comment: (NOTE) The Xpert Xpress SARS-CoV-2/FLU/RSV assay is intended as an aid in  the diagnosis of influenza from Nasopharyngeal swab specimens and  should not be used as a sole basis for treatment. Nasal washings and  aspirates are unacceptable for Xpert Xpress SARS-CoV-2/FLU/RSV  testing.  Fact Sheet for Patients: https://www.moore.com/  Fact Sheet for Healthcare Providers: https://www.young.biz/  This test is not yet approved or cleared by the [place] FDA and  has been authorized for detection and/or diagnosis of SARS-CoV-2 by  FDA under an Emergency Use  Authorization (EUA). This EUA will remain  in effect (meaning this test can be used) for the duration of the    Covid-19 declaration under Section 564(b)(1) of the Act, 21  U.S.C. section 360bbb-3(b)(1), unless the authorization is  terminated or revoked. Performed at [facility], [address] [place], [place] [address]   Blood Culture (routine x 2)     Status: None (Preliminary result)   Collection Time: 01/07/20 12:35 PM   Specimen: BLOOD LEFT HAND  Result Value Ref Range Status   Specimen Description   Final    BLOOD LEFT HAND Performed at [facility], [address], [place], [place] [address]    Special Requests   Final    BOTTLES DRAWN AEROBIC AND ANAEROBIC Blood Culture results may not be optimal due to an inadequate volume of blood received in culture bottles Performed at [facility], [address] [place], [place] [address]    Culture   Final    NO GROWTH 2 DAYS Performed at [facility], [address], [place], [place] [address]    Report Status PENDING  Incomplete  Urine culture     Status: Abnormal   Collection Time: 01/07/20  3:30 PM   Specimen: In/Out Cath Urine  Result Value Ref Range Status   Specimen Description   Final    IN/OUT CATH URINE Performed at [facility], [address] [place], [place] [address]    Special Requests   Final    NONE Performed at [facility], [address], [place], [place] [address]    Culture 7,000 COLONIES/mL ENTEROCOCCUS FAECALIS (A)  Final   Report Status 01/09/2020 FINAL  Final   Organism ID, Bacteria ENTEROCOCCUS FAECALIS (A)  Final      Susceptibility   Enterococcus faecalis - MIC*    AMPICILLIN <=2 SENSITIVE Sensitive     NITROFURANTOIN <=16 SENSITIVE Sensitive     VANCOMYCIN 2 SENSITIVE Sensitive     * 7,000 COLONIES/mL ENTEROCOCCUS FAECALIS    Radiology Studies: DG Pelvis 1-2 Views  Result Date: 01/08/2020 CLINICAL DATA:  Left hip pain after fall several days ago. EXAM: PELVIS - 1-2 VIEW COMPARISON:  CT abdomen pelvis from  yesterday. FINDINGS: No acute fracture or dislocation. Unchanged mild right greater than left hip joint space narrowing with marginal osteophytes. Unchanged increased subchondral sclerosis in the right femoral head. The pubic symphysis and sacroiliac joints are unremarkable. Soft tissues are unremarkable. Extensive vascular calcifications. IMPRESSION: 1. No acute osseous abnormality. 2. 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 Electronically Signed   By: Obie Dredge M.D.   On: 01/08/2020 19:09   DG FEMUR MIN 2 VIEWS LEFT  Result Date: 01/08/2020 CLINICAL DATA:  Left hip pain following fall several days ago, initial encounter EXAM: LEFT FEMUR 2 VIEWS COMPARISON:  None. FINDINGS: Degenerative changes of left hip joint are noted. No acute fracture or dislocation is noted. Heavy atherosclerotic calcifications are noted. 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 No soft tissue abnormality is noted. IMPRESSION: No acute abnormality noted.  Degenerative changes are seen. Electronically Signed   By: Alcide Clever M.D.   On: 01/08/2020 19:03     Sinthia Karabin T. Javad Salva [facility]  If 7PM-7AM, please contact night-coverage [email or web address] 01/09/2020, 11:09 AM

## 2020-01-09 NOTE — Progress Notes (Signed)
PT Cancellation Note  Patient Details Name: [name] MRN: [id number] DOB: [birth year]   Cancelled Treatment:    Reason Eval/Treat Not Completed: Patient at procedure or test/unavailable   Chriselda Leppert,KATHrine E 01/09/2020, 3:27 PM Arlyce Dice, DPT Acute Rehabilitation Services Pager: [phone] Office: [phone]

## 2020-01-09 NOTE — Plan of Care (Signed)
  Problem: Clinical Measurements: Goal: Ability to maintain clinical measurements within normal limits will improve Outcome: Progressing Goal: Will remain free from infection Outcome: Progressing Goal: Diagnostic test results will improve Outcome: Progressing   Problem: Education: Goal: Knowledge of General Education information will improve Description: Including pain rating scale, medication(s)/side effects and non-pharmacologic comfort measures Outcome: Progressing   Problem: Activity: Goal: Risk for activity intolerance will decrease Outcome: Progressing   Problem: Nutrition: Goal: Adequate nutrition will be maintained Outcome: Progressing   Problem: Coping: Goal: Level of anxiety will decrease Outcome: Progressing   Problem: Safety: Goal: Ability to remain free from injury will improve Outcome: Progressing   Problem: Skin Integrity: Goal: Risk for impaired skin integrity will decrease Outcome: Progressing

## 2020-01-09 NOTE — Evaluation (Signed)
Occupational Therapy Evaluation Patient Details Name: [name] MRN: [id number] DOB: 06-29-1950 Today's Date: 01/09/2020    History of Present Illness 69 year old male with history of HCC s/p chemo currently on lenvatinib and followed by [name], alcoholism, hep C status post Harvoni, IDDM-2, PAD, COPD, HTN, and right supraspinatus muscle tear brought to ED by EMS after fall from his bed and "feeling sick". Has nausea and right-sided abdominal pain. No head impact or LOC after fall.   Clinical Impression   [name] is a 69 year old man normally independent and who lives alone who presents today with decreased activity tolerance and dyspnea with activity. Patient demonstrated ability to transfer in and out of bed, ambulate in room with supervision, stand at sink to perform grooming task, perform toilet transfer and don socks. Patient struggled significantly with donning socks and will benefit from AE.  Patient reports shortness of breath new since his fall. Patient will benefit from skilled OT services while in hospital to improve deficits and learn compensatory strategies as needed in order to return home at discharge at [facility].      Follow Up Recommendations  No OT follow up    Equipment Recommendations  None recommended by OT    Recommendations for Other Services       Precautions / Restrictions Precautions Precautions: Fall Precaution Comments: Larey Seat at home Restrictions Weight Bearing Restrictions: No      Mobility Bed Mobility Overal bed mobility: Modified Independent                  Transfers Overall transfer level: Needs assistance Equipment used: None             General transfer comment: supervision to ambulate in room, stand at sink and perform toilet transfer. Reports stiffness in left hip and reports that is why his gait "is off."    Balance Overall balance assessment: No apparent balance deficits (not formally assessed)                                          ADL either performed or assessed with clinical judgement   ADL Overall ADL's : Needs assistance/impaired Eating/Feeding: Independent   Grooming: Oral care;Wash/dry face;Wash/dry hands;Standing;Independent Grooming Details (indicate cue type and reason): stood at sink to perform grooming Upper Body Bathing: Set up;Sitting   Lower Body Bathing: Minimal assistance;Set up;Sit to/from stand Lower Body Bathing Details (indicate cue type and reason): Difficulty reaching feet Upper Body Dressing : Set up;Sitting   Lower Body Dressing: Minimal assistance Lower Body Dressing Details (indicate cue type and reason): Struggled significantly to don socks. Requires positioning and low surface to perform at home. Will benefit from sock aid. Toilet Transfer: Supervision/safety;BSC;Ambulation;Grab Agricultural consultant Details (indicate cue type and reason): Demonstrated ability to perform toilet transfer. Toileting- Clothing Manipulation and Hygiene: Supervision/safety;Sit to/from stand               Vision   Vision Assessment?: No apparent visual deficits     Perception     Praxis      Pertinent Vitals/Pain Pain Assessment: No/denies pain     Hand Dominance Right   Extremity/Trunk Assessment Upper Extremity Assessment Upper Extremity Assessment: Overall WFL for tasks assessed   Lower Extremity Assessment Lower Extremity Assessment: Defer to PT evaluation   Cervical / Trunk Assessment Cervical / Trunk Assessment: Normal   Communication  Communication Communication: No difficulties   Cognition Arousal/Alertness: Awake/alert Behavior During Therapy: WFL for tasks assessed/performed Overall Cognitive Status: Within Functional Limits for tasks assessed                                     General Comments       Exercises     Shoulder Instructions      Home Living Family/patient expects to be discharged to:: Private  residence Living Arrangements: Alone Available Help at Discharge: Available PRN/intermittently Type of Home: House Home Access: Level entry     Home Layout: One level     Bathroom Shower/Tub: Tub/shower unit;Curtain   Firefighter: Standard     Home Equipment: Environmental consultant - 2 wheels;Cane - single point;Shower seat          Prior Functioning/Environment Level of Independence: Independent        Comments: Reports independence with ADLs and mobility. Reports having a landlord that "could stob by."        OT Problem List: Decreased activity tolerance;Decreased knowledge of use of DME or AE;Obesity      OT Treatment/Interventions: Self-care/ADL training;DME and/or AE instruction;Therapeutic activities;Patient/family education;Balance training    OT Goals(Current goals can be found in the care plan section) Acute Rehab OT Goals Patient Stated Goal: did not state OT Goal Formulation: With patient Time For Goal Achievement: 01/23/20 Potential to Achieve Goals: Good  OT Frequency: Min 2X/week   Barriers to D/C: Decreased caregiver support          Co-evaluation              AM-PAC OT "6 Clicks" Daily Activity     Outcome Measure Help from another person eating meals?: None Help from another person taking care of personal grooming?: None Help from another person toileting, which includes using toliet, bedpan, or urinal?: A Little Help from another person bathing (including washing, rinsing, drying)?: A Little Help from another person to put on and taking off regular upper body clothing?: A Little Help from another person to put on and taking off regular lower body clothing?: A Little 6 Click Score: 20   End of Session Equipment Utilized During Treatment: Gait belt Nurse Communication: Mobility status  Activity Tolerance: Patient tolerated treatment well Patient left: in bed;with bed alarm set;with call bell/phone within reach  OT Visit Diagnosis: Muscle  weakness (generalized) (M62.81)                Time: 1610-9604 OT Time Calculation (min): 18 min Charges:  OT General Charges $OT Visit: 1 Visit OT Evaluation $OT Eval Low Complexity: 1 Low  Morrison Mcbryar, OTR/L Acute Care Rehab Services  Office [phone] Pager: [phone]   Kelli Churn 01/09/2020, 1:13 PM

## 2020-01-09 NOTE — Progress Notes (Signed)
RN notified Dr. Cyndia Skeeters of increased crackles bilaterally with pt c/o a "little short of breath." RN stopped IV fluids per MD order. Labs and CXR ordered by MD. MD to follow-up, RN will continue to monitor.

## 2020-01-09 NOTE — Progress Notes (Signed)
   01/09/20 1418  Vitals  Temp 98 F (36.7 C)  BP (!) 157/86  MAP (mmHg) 106  BP Method Automatic  Pulse Rate (!) 102  Pulse Rate Source Monitor  Resp (!) 34  MEWS COLOR  MEWS Score Color Yellow  Oxygen Therapy  SpO2 98 %  MEWS Score  MEWS Temp 0  MEWS Systolic 0  MEWS Pulse 1  MEWS RR 2  MEWS LOC 0  MEWS Score 3   RN notified charge RN Myriam Jacobson and Dr. Cyndia Skeeters of pt changing to yellow MEWS. See MD orders.

## 2020-01-09 NOTE — Progress Notes (Signed)
D-dimer elevated. Although not specific especially in his case, we need to exclude VTE given tachycardia and tachypnea without clear explanation. CTA chest and LE venous US ordered. Hesitant to anticoagulate yet given his coagulopathy. Oxygen saturation good. BP slightly high which makes large central PE unlikely.

## 2020-01-10 ENCOUNTER — Inpatient Hospital Stay (HOSPITAL_COMMUNITY): Payer: Medicare HMO

## 2020-01-10 DIAGNOSIS — K5900 Constipation, unspecified: Secondary | ICD-10-CM

## 2020-01-10 DIAGNOSIS — R7989 Other specified abnormal findings of blood chemistry: Secondary | ICD-10-CM

## 2020-01-10 DIAGNOSIS — M6282 Rhabdomyolysis: Secondary | ICD-10-CM

## 2020-01-10 DIAGNOSIS — C220 Liver cell carcinoma: Secondary | ICD-10-CM

## 2020-01-10 LAB — CBC WITH DIFFERENTIAL/PLATELET
Abs Immature Granulocytes: 0.11 10*3/uL — ABNORMAL HIGH (ref 0.00–0.07)
Basophils Absolute: 0.1 10*3/uL (ref 0.0–0.1)
Basophils Relative: 1 %
Eosinophils Absolute: 0.3 10*3/uL (ref 0.0–0.5)
Eosinophils Relative: 2 %
HCT: 34.3 % — ABNORMAL LOW (ref 39.0–52.0)
Hemoglobin: 11.9 g/dL — ABNORMAL LOW (ref 13.0–17.0)
Immature Granulocytes: 1 %
Lymphocytes Relative: 12 %
Lymphs Abs: 1.3 10*3/uL (ref 0.7–4.0)
MCH: 26.6 pg (ref 26.0–34.0)
MCHC: 34.7 g/dL (ref 30.0–36.0)
MCV: 76.7 fL — ABNORMAL LOW (ref 80.0–100.0)
Monocytes Absolute: 1.3 10*3/uL — ABNORMAL HIGH (ref 0.1–1.0)
Monocytes Relative: 12 %
Neutro Abs: 8.1 10*3/uL — ABNORMAL HIGH (ref 1.7–7.7)
Neutrophils Relative %: 72 %
Platelets: 445 10*3/uL — ABNORMAL HIGH (ref 150–400)
RBC: 4.47 MIL/uL (ref 4.22–5.81)
RDW: 15.1 % (ref 11.5–15.5)
WBC: 11.1 10*3/uL — ABNORMAL HIGH (ref 4.0–10.5)
nRBC: 0 % (ref 0.0–0.2)

## 2020-01-10 LAB — COMPREHENSIVE METABOLIC PANEL
ALT: 72 U/L — ABNORMAL HIGH (ref 0–44)
AST: 126 U/L — ABNORMAL HIGH (ref 15–41)
Albumin: 2.2 g/dL — ABNORMAL LOW (ref 3.5–5.0)
Alkaline Phosphatase: 162 U/L — ABNORMAL HIGH (ref 38–126)
Anion gap: 7 (ref 5–15)
BUN: 22 mg/dL (ref 8–23)
CO2: 22 mmol/L (ref 22–32)
Calcium: 7.6 mg/dL — ABNORMAL LOW (ref 8.9–10.3)
Chloride: 103 mmol/L (ref 98–111)
Creatinine, Ser: 1.05 mg/dL (ref 0.61–1.24)
GFR, Estimated: >60 mL/min (ref >60)
Glucose, Bld: 115 mg/dL — ABNORMAL HIGH (ref 70–99)
Potassium: 4.2 mmol/L (ref 3.5–5.1)
Sodium: 132 mmol/L — ABNORMAL LOW (ref 135–145)
Total Bilirubin: 3 mg/dL — ABNORMAL HIGH (ref 0.3–1.2)
Total Protein: 6.4 g/dL — ABNORMAL LOW (ref 6.5–8.1)

## 2020-01-10 LAB — MAGNESIUM: Magnesium: 2.5 mg/dL — ABNORMAL HIGH (ref 1.7–2.4)

## 2020-01-10 LAB — GLUCOSE, CAPILLARY
Glucose-Capillary: 110 mg/dL — ABNORMAL HIGH (ref 70–99)
Glucose-Capillary: 199 mg/dL — ABNORMAL HIGH (ref 70–99)
Glucose-Capillary: 161 mg/dL — ABNORMAL HIGH (ref 70–99)
Glucose-Capillary: 181 mg/dL — ABNORMAL HIGH (ref 70–99)

## 2020-01-10 LAB — PHOSPHORUS: Phosphorus: 2.2 mg/dL — ABNORMAL LOW (ref 2.5–4.6)

## 2020-01-10 MED ORDER — POLYETHYLENE GLYCOL 3350 17 G PO PACK
17.0000 g | PACK | Freq: Two times a day (BID) | ORAL | Status: AC
  Administered 2020-01-10 – 2020-01-11 (×3): 17 g via ORAL
  Filled 2020-01-10 (×3): qty 1

## 2020-01-10 MED ORDER — SODIUM CHLORIDE 0.9 % IV SOLN
12.5000 mg | Freq: Four times a day (QID) | INTRAVENOUS | Status: DC | PRN
  Administered 2020-01-10 – 2020-01-11 (×4): 12.5 mg via INTRAVENOUS
  Filled 2020-01-10 (×5): qty 0.5

## 2020-01-10 MED ORDER — AMLODIPINE BESYLATE 10 MG PO TABS
10.0000 mg | ORAL_TABLET | Freq: Every day | ORAL | Status: DC
  Administered 2020-01-10 – 2020-01-12 (×3): 10 mg via ORAL
  Filled 2020-01-10 (×3): qty 1

## 2020-01-10 MED ORDER — HYDROCHLOROTHIAZIDE 12.5 MG PO CAPS
12.5000 mg | ORAL_CAPSULE | Freq: Every day | ORAL | Status: DC
  Administered 2020-01-10 – 2020-01-12 (×3): 12.5 mg via ORAL
  Filled 2020-01-10 (×3): qty 1

## 2020-01-10 MED ORDER — SENNOSIDES-DOCUSATE SODIUM 8.6-50 MG PO TABS
1.0000 | ORAL_TABLET | Freq: Two times a day (BID) | ORAL | Status: AC
  Administered 2020-01-10 – 2020-01-11 (×4): 1 via ORAL
  Filled 2020-01-10 (×4): qty 1

## 2020-01-10 NOTE — Progress Notes (Signed)
PROGRESS NOTE  [name] ZOX:[id number] DOB: 1950/09/09   PCP: [name], MD  Patient is from: Home  DOA: 01/07/2020 LOS: 3  Chief complaints: "Sick"  Brief Narrative / Interim history: 69 year old male with history of HCC s/p chemo currently on lenvatinib and followed by [name], alcoholism, hep C status post Harvoni, IDDM-2, PAD, COPD, HTN, and right supraspinatus muscle tear brought to ED by EMS after fall from his bed and "feeling sick". Has nausea and right-sided abdominal pain. No head impact or LOC after fall.  In ED, febrile to 102.2.  Tachycardic to 113.  Normotensive.  98% on RA.  WBC 11.7 with some left shift. LA 2.2> 2.0. NA 129. Cr 1.49 (b/l 1.0-1.1).  ALP 236.  AST 515.  ALT 163.  APTT/PT/INR 39/15.3/1.3. RVP negative.  UA negative.  CXR without acute finding.  CT abdomen and pelvis with contrast concerning for cirrhosis and HCC.  Cultures obtained.  Started on cefepime and Flagyl, and admitted for SIRS without clear source of infection.  Blood cultures negative.  Urine culture with 7000 colonies of Enterococcus faecalis but UTI symptoms.  Antibiotic deescalated to Unasyn 11/06.   Patient had some respiratory distress with tachypnea and tachycardia.  IV fluid discontinued.  CXR without acute finding but elevated right hemidiaphragm. D-dimer elevated.  CTA chest negative for PE but possible lytic lesions in the right second rib. LE venous US pending.  Overall, seems to be improving.  Oncology following.  Could be released in the next 1 to 2 days if cleared by oncology.   Subjective: Seen and examined earlier this morning.  No major events overnight of this morning.  No major complaints.  Reports improvement in his right-sided pain.  He currently rates his pain 7/10.  LLE pain resolved.  He says he has not had a bowel movement in days.  He denies UTI symptoms.  Objective: Vitals:   01/09/20 1619 01/09/20 1817 01/09/20 2120 01/10/20 0622  BP: (!) 181/84 (!)  167/79 (!) 167/77 (!) 182/79  Pulse: (!) 107 (!) 109 98 92  Resp: (!) 30 (!) 30 (!) 23 (!) 23  Temp: 98.9 F (37.2 C)  98.6 F (37 C) 99 F (37.2 C)  TempSrc:   Axillary   SpO2: 97% 98% 97% 96%    Intake/Output Summary (Last 24 hours) at 01/10/2020 1154 Last data filed at 01/10/2020 0200 Gross per 24 hour  Intake 2880.88 ml  Output 1900 ml  Net 980.88 ml   There were no vitals filed for this visit.  Examination:  GENERAL: No apparent distress.  Nontoxic. HEENT: MMM.  Vision and hearing grossly intact.  NECK: Supple.  No apparent JVD.  RESP: On room air.  No IWOB.  Fair aeration bilaterally. CVS:  RRR. Heart sounds normal.  ABD/GI/GU: BS+.  Slightly distended abdomen.  Mild discomfort but no tenderness or rebound. MSK/EXT:  Moves extremities. No apparent deformity. No edema.  SKIN: no apparent skin lesion or wound NEURO: Awake, alert and oriented appropriately.  No apparent focal neuro deficit. PSYCH: Calm. Normal affect.  Procedures:  None  Microbiology summarized: RVP negative. Blood cultures pending. Urine culture pending.  Assessment & Plan: Severe sepsis due to Enterococcus UTI? POA: Meets criteria with SIRS (fever, tachycardia and tachypnea) and lactic acidosis, AKI and elevated liver enzymes.  Urine culture with Enterococcus faecalis.  Blood cultures NGTD. -IV cefepime and IV Flagyl 11/4-11/6 -De-escalate to IV Unasyn 11/6>>>   Hepatocellular carcinoma/liver cirrhosis: Followed by oncology, Dr. [name] Right-sided  abdominal, chest and neck pain-suspect this is related to [facility] and possible diaphragmatic irritation.  He also have hiccups.  LFTs likely due to rhabdo and improving. -Oncology following.  Lenvatinib on hold pending sepsis work-up -Antibiotic coverage as above. -Pain control with as needed oxycodone and fentanyl -Changed prochlorpromazine to chlorpromazine  Dyspnea: due to elevated hemidiaphragm/diaphragmatic irritation?  CXR and CTA reveals elevated  right hemidiaphragm.  No PE on CTA.  Maintaining good saturation on room air. -Supportive care  Elevated D-dimer: Nonspecific.  CTA chest negative for PE. -Follow LE venous US  Fall at home: Reportedly fell out of bed and landed on carpeted floor. No head impact or LOC.  Rhabdomyolysis: Likely traumatic from fall.  CK 771>> 303. -Discontinued IV fluid -PT/OT -Pain control as above  Elevated liver enzymes: Pattern consistent with rhabdo.  Denies alcohol.  HIV and acute hepatitis panel negative except for HCV antibody.  Improving. -Continue monitoring -Hold home statin.  Coagulopathy: Likely due to cirrhosis/hepatocellular carcinoma.  No evidence of bleeding. -Continue monitoring  Uncontrolled IDDM-2: A1c 7.1%.  CBG within fair range. Recent Labs  Lab 01/09/20 1133 01/09/20 1658 01/09/20 2117 01/10/20 0739 01/10/20 1120  GLUCAP 135* 162* 188* 110* 199*  -Continue current regimen  AKI: Likely due to rhabdomyolysis.  Resolved. -Continue monitoring  Essential hypertension: SBP elevated.  On lisinopril/HCTZ at home. -Continue home lisinopril.   -Add amlodipine 10 mg daily. -Resume home HCTZ at lower dose -Discontinued IV fluid.  Hyperlipidemia: -Hold statin in the setting of elevated LFT  Chronic COPD: Not on oxygen at home. -Continue home medications  Right rotator cuff injury: History of right supraspinatus injury.  Stable.  Fair range of motion in right shoulder. -Pain control, PT/OT  Constipation: Has not a bowel movement for days now. -Scheduled MiraLAX and Senokot-S x4 doses  Nutritional needs: There is no height or weight on file to calculate BMI. Nutrition Problem: Increased nutrient needs Etiology: acute illness (sepsis suspected intra-abdominal source) Signs/Symptoms: estimated needs Interventions: Glucerna shake   DVT prophylaxis:  heparin injection 5,000 Units Start: 01/07/20 2200  Code Status: Full code Family Communication: Updated patient's  sister over the phone Status is: Inpatient  Remains inpatient appropriate because:Ongoing diagnostic testing needed not appropriate for outpatient work up, IV treatments appropriate due to intensity of illness or inability to take PO and Inpatient level of care appropriate due to severity of illness   Dispo: The patient is from: Home              Anticipated d/c is to: Home              Anticipated d/c date is: 2 days              Patient currently is not medically stable to d/c.    Consultants:  Oncology   Sch Meds:  Scheduled Meds: . amLODipine  10 mg Oral Daily  . aspirin EC  81 mg Oral Daily  . cholecalciferol  1,000 Units Oral Daily  . feeding supplement (GLUCERNA SHAKE)  237 mL Oral TID BM  . heparin  5,000 Units Subcutaneous Q8H  . insulin aspart  0-15 Units Subcutaneous TID WC  . insulin aspart  0-5 Units Subcutaneous QHS  . latanoprost  1 drop Both Eyes QHS  . lisinopril  20 mg Oral Daily  . mometasone-formoterol  2 puff Inhalation BID  . polyethylene glycol  17 g Oral BID  . senna-docusate  1 tablet Oral BID  . traZODone  50 mg Oral  QHS   Continuous Infusions: . ampicillin-sulbactam (UNASYN) IV 3 g (01/10/20 0840)  . chlorproMAZINE (THORAZINE) IV     PRN Meds:.albuterol, chlorproMAZINE (THORAZINE) IV, diphenhydrAMINE, fentaNYL (SUBLIMAZE) injection, guaiFENesin, hydrALAZINE, magic mouthwash, ondansetron **OR** ondansetron (ZOFRAN) IV, oxyCODONE  Antimicrobials: Anti-infectives (From admission, onward)   Start     Dose/Rate Route Frequency Ordered Stop   01/09/20 0800  Ampicillin-Sulbactam (UNASYN) 3 g in sodium chloride 0.9 % 100 mL IVPB        3 g 200 mL/hr over 30 Minutes Intravenous Every 6 hours 01/09/20 0653     01/08/20 1400  metroNIDAZOLE (FLAGYL) tablet 500 mg  Status:  Discontinued        500 mg Oral Every 8 hours 01/08/20 1105 01/09/20 0653   01/08/20 1400  ceFEPIme (MAXIPIME) 2 g in sodium chloride 0.9 % 100 mL IVPB  Status:  Discontinued        2  g 200 mL/hr over 30 Minutes Intravenous Every 8 hours 01/08/20 1219 01/08/20 1225   01/08/20 1400  ceFEPIme (MAXIPIME) 2 g in sodium chloride 0.9 % 100 mL IVPB  Status:  Discontinued        2 g 200 mL/hr over 30 Minutes Intravenous Every 12 hours 01/08/20 1225 01/09/20 0653   01/08/20 0200  ceFEPIme (MAXIPIME) 2 g in sodium chloride 0.9 % 100 mL IVPB  Status:  Discontinued        2 g 200 mL/hr over 30 Minutes Intravenous Every 12 hours 01/07/20 1848 01/08/20 1219   01/07/20 2200  metroNIDAZOLE (FLAGYL) IVPB 500 mg  Status:  Discontinued        500 mg 100 mL/hr over 60 Minutes Intravenous Every 8 hours 01/07/20 1824 01/08/20 1105   01/07/20 1430  metroNIDAZOLE (FLAGYL) IVPB 500 mg       "And" Linked Group Details   500 mg 100 mL/hr over 60 Minutes Intravenous  Once 01/07/20 1327 01/07/20 1510   01/07/20 1400  ceFEPIme (MAXIPIME) 2 g in sodium chloride 0.9 % 100 mL IVPB       "And" Linked Group Details   2 g 200 mL/hr over 30 Minutes Intravenous  Once 01/07/20 1327 01/07/20 1510       I have personally reviewed the following labs and images: CBC: Recent Labs  Lab 01/07/20 1229 01/07/20 1847 01/08/20 0722 01/09/20 0558 01/10/20 0548  WBC 11.7* 10.4 8.5 10.8* 11.1*  NEUTROABS 8.5*  --   --  7.6 8.1*  HGB 15.0 13.9 12.1* 12.0* 11.9*  HCT 43.3 41.0 36.6* 35.9* 34.3*  MCV 77.3* 78.5* 78.4* 78.2* 76.7*  PLT 510* 438* 409* 448* 445*   BMP &GFR Recent Labs  Lab 01/07/20 1229 01/07/20 1847 01/08/20 0722 01/09/20 0558 01/10/20 0548  NA 129*  --  127* 131* 132*  K 4.4  --  4.4 4.2 4.2  CL 96*  --  96* 100 103  CO2 20*  --  21* 19* 22  GLUCOSE 137*  --  147* 99 115*  BUN 25*  --  19 24* 22  CREATININE 1.49* 1.23 1.34* 1.24 1.05  CALCIUM 8.4*  --  7.5* 7.6* 7.6*  MG  --   --  1.8 2.1 2.5*  PHOS  --   --  2.8 2.7 2.2*   Estimated Creatinine Clearance: 67.6 mL/min (by C-G formula based on SCr of 1.05 mg/dL). Liver & Pancreas: Recent Labs  Lab 01/07/20 1229 01/07/20 1847  01/08/20 0722 01/09/20 0558 01/10/20 0548  AST 715* 483* 297* 184*  126*  ALT 163* 129* 101* 83* 72*  ALKPHOS 236* 201* 160* 180* 162*  BILITOT 4.3* 3.7* 3.4* 3.3* 3.0*  PROT 8.1 6.9 6.4* 6.9 6.4*  ALBUMIN 3.1* 2.7* 2.3* 2.5* 2.2*   Recent Labs  Lab 01/07/20 1847  LIPASE 15   No results for input(s): AMMONIA in the last 168 hours. Diabetic: Recent Labs    01/07/20 1847  HGBA1C 7.1*   Recent Labs  Lab 01/09/20 1133 01/09/20 1658 01/09/20 2117 01/10/20 0739 01/10/20 1120  GLUCAP 135* 162* 188* 110* 199*   Cardiac Enzymes: Recent Labs  Lab 01/08/20 0722 01/09/20 0558  CKTOTAL 771* 303   No results for input(s): PROBNP in the last 8760 hours. Coagulation Profile: Recent Labs  Lab 01/07/20 1229 01/08/20 0439 01/09/20 0558  INR 1.3* 1.3* 1.3*   Thyroid Function Tests: No results for input(s): TSH, T4TOTAL, FREET4, T3FREE, THYROIDAB in the last 72 hours. Lipid Profile: No results for input(s): CHOL, HDL, LDLCALC, TRIG, CHOLHDL, LDLDIRECT in the last 72 hours. Anemia Panel: No results for input(s): VITAMINB12, FOLATE, FERRITIN, TIBC, IRON, RETICCTPCT in the last 72 hours. Urine analysis:    Component Value Date/Time   COLORURINE YELLOW 01/07/2020 1530   APPEARANCEUR CLEAR 01/07/2020 1530   LABSPEC 1.027 01/07/2020 1530   PHURINE 6.0 01/07/2020 1530   GLUCOSEU NEGATIVE 01/07/2020 1530   HGBUR NEGATIVE 01/07/2020 1530   BILIRUBINUR NEGATIVE 01/07/2020 1530   KETONESUR NEGATIVE 01/07/2020 1530   PROTEINUR NEGATIVE 01/07/2020 1530   UROBILINOGEN 1.0 01/12/2014 0911   NITRITE NEGATIVE 01/07/2020 1530   LEUKOCYTESUR NEGATIVE 01/07/2020 1530   Sepsis Labs: Invalid input(s): PROCALCITONIN, LACTICIDVEN  Microbiology: Recent Results (from the past 240 hour(s))  Blood Culture (routine x 2)     Status: None (Preliminary result)   Collection Time: 01/07/20 12:30 PM   Specimen: BLOOD  Result Value Ref Range Status   Specimen Description   Final    BLOOD RIGHT  ANTECUBITAL Performed at [facility], [address], [place], [place] [address]    Special Requests   Final    BOTTLES DRAWN AEROBIC AND ANAEROBIC Blood Culture adequate volume Performed at [facility], [address], [place], [place] [address]    Culture   Final    NO GROWTH 3 DAYS Performed at [facility], [address] [place], [place] [address]    Report Status PENDING  Incomplete  Respiratory Panel by RT PCR (Flu A&B, Covid) - Nasopharyngeal Swab     Status: None   Collection Time: 01/07/20 12:30 PM   Specimen: Nasopharyngeal Swab  Result Value Ref Range Status   SARS Coronavirus 2 by RT PCR NEGATIVE NEGATIVE Final    Comment: (NOTE) SARS-CoV-2 target nucleic acids are NOT DETECTED.  The SARS-CoV-2 RNA is generally detectable in upper respiratoy specimens during the acute phase of infection. The lowest concentration of SARS-CoV-2 viral copies this assay can detect is 131 copies/mL. A negative result does not preclude SARS-Cov-2 infection and should not be used as the sole basis for treatment or other patient management decisions. A negative result may occur with  improper specimen collection/handling, submission of specimen other than nasopharyngeal swab, presence of viral mutation(s) within the areas targeted by this assay, and inadequate number of viral copies (<131 copies/mL). A negative result must be combined with clinical observations, patient history, and epidemiological information. The expected result is Negative.  Fact Sheet for Patients:  https://www.moore.com/  Fact Sheet for Healthcare Providers:  https://www.young.biz/  This  test is no t yet approved or cleared by the [place] and  has been authorized for detection and/or diagnosis of SARS-CoV-2 by FDA under an Emergency Use Authorization (EUA). This EUA will remain  in effect (meaning this test can be used)  for the duration of the COVID-19 declaration under Section 564(b)(1) of the Act, 21 U.S.C. section 360bbb-3(b)(1), unless the authorization is terminated or revoked sooner.     Influenza A by PCR NEGATIVE NEGATIVE Final   Influenza B by PCR NEGATIVE NEGATIVE Final    Comment: (NOTE) The Xpert Xpress SARS-CoV-2/FLU/RSV assay is intended as an aid in  the diagnosis of influenza from Nasopharyngeal swab specimens and  should not be used as a sole basis for treatment. Nasal washings and  aspirates are unacceptable for Xpert Xpress SARS-CoV-2/FLU/RSV  testing.  Fact Sheet for Patients: https://www.moore.com/  Fact Sheet for Healthcare Providers: https://www.young.biz/  This test is not yet approved or cleared by the [place] FDA and  has been authorized for detection and/or diagnosis of SARS-CoV-2 by  FDA under an Emergency Use Authorization (EUA). This EUA will remain  in effect (meaning this test can be used) for the duration of the  Covid-19 declaration under Section 564(b)(1) of the Act, 21  U.S.C. section 360bbb-3(b)(1), unless the authorization is  terminated or revoked. Performed at [facility], [address] [place], [place] [address]   Blood Culture (routine x 2)     Status: None (Preliminary result)   Collection Time: 01/07/20 12:35 PM   Specimen: BLOOD LEFT HAND  Result Value Ref Range Status   Specimen Description   Final    BLOOD LEFT HAND Performed at [facility], [address], [place], [place] [address]    Special Requests   Final    BOTTLES DRAWN AEROBIC AND ANAEROBIC Blood Culture results may not be optimal due to an inadequate volume of blood received in culture bottles Performed at [facility], [address], [place], [place] [address]    Culture   Final    NO GROWTH 3 DAYS Performed at [facility], [address] [place], [place] [address]    Report  Status PENDING  Incomplete  Urine culture     Status: Abnormal   Collection Time: 01/07/20  3:30 PM   Specimen: In/Out Cath Urine  Result Value Ref Range Status   Specimen Description   Final    IN/OUT CATH URINE Performed at [facility], [address], [place], [place] [address]    Special Requests   Final    NONE Performed at [facility], [address], [place], [place] [address]    Culture 7,000 COLONIES/mL ENTEROCOCCUS FAECALIS (A)  Final   Report Status 01/09/2020 FINAL  Final   Organism ID, Bacteria ENTEROCOCCUS FAECALIS (A)  Final      Susceptibility   Enterococcus faecalis - MIC*    AMPICILLIN <=2 SENSITIVE Sensitive     NITROFURANTOIN <=16 SENSITIVE Sensitive     VANCOMYCIN 2 SENSITIVE Sensitive     * 7,000 COLONIES/mL ENTEROCOCCUS FAECALIS    Radiology Studies: DG Chest 2 View  Result Date: 01/09/2020 CLINICAL DATA:  69 year old male with history of shortness of breath. EXAM: CHEST - 2 VIEW COMPARISON:  Chest x-ray 01/07/2020. FINDINGS: Elevation of the right hemidiaphragm. Lung volumes are normal. No consolidative airspace disease. No pleural effusions. No pneumothorax. No pulmonary nodule or mass noted. Pulmonary vasculature and the  cardiomediastinal silhouette are within normal limits. IMPRESSION: 1. No radiographic evidence of acute cardiopulmonary disease. 2. Elevation of the right hemidiaphragm, similar to the prior study. Electronically Signed   By: Trudie Reed M.D.   On: 01/09/2020 18:46   CT ANGIO CHEST PE W OR WO CONTRAST  Result Date: 01/09/2020 CLINICAL DATA:  Pulmonary embolism (PE), suspected Tachycardia and tachypnea. Patient has history of hepatocellular carcinoma. EXAM: CT ANGIOGRAPHY CHEST WITH CONTRAST TECHNIQUE: Multidetector CT imaging of the chest was performed using the standard protocol during bolus administration of intravenous contrast. Multiplanar CT image reconstructions and MIPs were obtained to  evaluate the vascular anatomy. CONTRAST:  OMNIPAQUE IOHEXOL 350 MG/ML SOLN COMPARISON:  Radiograph earlier today.  Chest CT a 01/20/2019 FINDINGS: Cardiovascular: There are no filling defects within the pulmonary arteries to suggest pulmonary embolus. Evaluation is diagnostic to the segmental level. The subsegmental branches are not well assessed due to breathing motion artifact and contrast bolus timing. Aortic atherosclerosis without dissection or aneurysm. Heart is normal in size. There is a small pericardial effusion abutting the right heart border. Mediastinum/Nodes: Patulous esophagus with fluid level distally and scattered areas of wall thickening. There is a prominent paraesophageal node adjacent to the distal esophagus measuring 7 mm, series 4, image 64. No enlarged mediastinal or hilar lymph nodes. No suspicious thyroid nodule. Lungs/Pleura: Small right pleural effusion with adjacent compressive atelectasis. No airspace consolidation. No pulmonary mass. Ill-defined subpleural opacities in the right upper lobe are nonspecific. Upper Abdomen: Prominent size liver with nodular contours consistent with cirrhosis. No acute upper abdominal findings. Musculoskeletal: There is a lytic lesion involving the right posterior second rib at the costovertebral junction with extraosseous soft tissue component and bony expansion, series 6, image 15. This is new from prior exam. Lucent lesion within the left lateral fifth rib, this is indeterminate and was present on prior exam, not significantly changed. Review of the MIP images confirms the above findings. IMPRESSION: 1. No pulmonary embolus. 2. Small right pleural effusion with adjacent compressive atelectasis. 3. Lytic lesion involving the right posterior second rib at the costovertebral junction with extraosseous soft tissue component and bony expansion, suspicious for metastatic disease. Lucent lesion within the left lateral fifth rib, also present on exam 1  year ago and not significantly changed, nonspecific. Bone scan could be considered for whole-body osseous evaluation. 4. Patulous esophagus with fluid level distally and scattered areas of wall thickening. Prominent paraesophageal node measuring 7 mm, nonspecific. Possibility of esophagitis or reflux is raised. 5. Cirrhosis. Aortic Atherosclerosis (ICD10-I70.0). These results will be called to the ordering clinician or representative by the Radiologist Assistant, and communication documented in the PACS or [employer]. Electronically Signed   By: Narda Rutherford M.D.   On: 01/09/2020 23:41     Neftali Thurow T. Audie Wieser [facility]  If 7PM-7AM, please contact night-coverage [email or web address] 01/10/2020, 11:54 AM

## 2020-01-10 NOTE — Plan of Care (Signed)
  Problem: Education: Goal: Knowledge of General Education information will improve Description: Including pain rating scale, medication(s)/side effects and non-pharmacologic comfort measures Outcome: Progressing   Problem: Clinical Measurements: Goal: Ability to maintain clinical measurements within normal limits will improve Outcome: Progressing Goal: Will remain free from infection Outcome: Progressing Goal: Diagnostic test results will improve Outcome: Progressing   Problem: Activity: Goal: Risk for activity intolerance will decrease Outcome: Progressing   Problem: Nutrition: Goal: Adequate nutrition will be maintained Outcome: Progressing   Problem: Coping: Goal: Level of anxiety will decrease Outcome: Progressing   Problem: Safety: Goal: Ability to remain free from injury will improve Outcome: Progressing   Problem: Skin Integrity: Goal: Risk for impaired skin integrity will decrease Outcome: Progressing

## 2020-01-10 NOTE — Evaluation (Signed)
Physical Therapy Evaluation Patient Details Name: [name] MRN: [id number] DOB: 1950/03/10 Today's Date: 01/10/2020   History of Present Illness  69 year old male with history of [place] s/p chemo , alcoholism, hep C , IDDM-2, PAD, COPD, HTN, and right supraspinatus muscle tear brought to ED by EMS after fall from his bed and "feeling sick". Has nausea and right-sided abdominal pain. No head impact or LOC after fall.  CT noted right rib lytic lesions  Clinical Impression  The patient ambulated x 200',(100 with light hand hold). Patient lives alone, should progress to Dc home. Pt admitted with above diagnosis.  Pt currently with functional limitations due to the deficits listed below (see PT Problem List). Pt will benefit from skilled PT to increase their independence and safety with mobility to allow discharge to the venue listed below.       Follow Up Recommendations No PT follow up    Equipment Recommendations  None recommended by PT    Recommendations for Other Services       Precautions / Restrictions Precautions Precautions: Fall Precaution Comments: [place] at home      Mobility  Bed Mobility Overal bed mobility: Modified Independent                  Transfers   Equipment used: None;1 person hand held assist             General transfer comment: hand hold  x 100' in hal;l, then no  UE support x 100'  Ambulation/Gait Ambulation/Gait assistance: Min guard Gait Distance (Feet): 200 Feet Assistive device: 1 person hand held assist;None Gait Pattern/deviations: Step-through pattern;Drifts right/left   Gait velocity interpretation: <1.31 ft/sec, indicative of household ambulator General Gait Details: gait somewhat fast  able to ambuklate without UE support  Stairs            Wheelchair Mobility    Modified Rankin (Stroke Patients Only)       Balance Overall balance assessment: Mild deficits observed, not formally tested                                            Pertinent Vitals/Pain Pain Assessment: Faces Faces Pain Scale: Hurts whole lot Pain Location: right side Pain Descriptors / Indicators: Jabbing;Grimacing;Guarding Pain Intervention(s): Monitored during session;Premedicated before session;Repositioned    Home Living Family/patient expects to be discharged to:: Private residence Living Arrangements: Alone Available Help at Discharge: Available PRN/intermittently Type of Home: House Home Access: Level entry     Home Layout: One level Home Equipment: Environmental consultant - 2 wheels;Cane - single point;Shower seat      Prior Function Level of Independence: Independent         Comments: Reports independence with ADLs and mobility. Reports having a landlord that "could stob by."     Hand Dominance        Extremity/Trunk Assessment   Upper Extremity Assessment Upper Extremity Assessment: Overall WFL for tasks assessed    Lower Extremity Assessment Lower Extremity Assessment: Generalized weakness    Cervical / Trunk Assessment Cervical / Trunk Assessment: Normal  Communication      Cognition Arousal/Alertness: Awake/alert Behavior During Therapy: WFL for tasks assessed/performed Overall Cognitive Status: Within Functional Limits for tasks assessed  General Comments      Exercises     Assessment/Plan    PT Assessment Patient needs continued PT services  PT Problem List Decreased strength;Decreased activity tolerance;Decreased balance;Decreased mobility;Pain       PT Treatment Interventions Gait training;Functional mobility training;DME instruction;Therapeutic activities;Therapeutic exercise;Patient/family education    PT Goals (Current goals can be found in the Care Plan section)  Acute Rehab PT Goals Patient Stated Goal: not have hiccups PT Goal Formulation: With patient Time For Goal Achievement: 01/24/20 Potential to Achieve  Goals: Good    Frequency Min 3X/week   Barriers to discharge        Co-evaluation               AM-PAC PT "6 Clicks" Mobility  Outcome Measure Help needed turning from your back to your side while in a flat bed without using bedrails?: None Help needed moving from lying on your back to sitting on the side of a flat bed without using bedrails?: None Help needed moving to and from a bed to a chair (including a wheelchair)?: None Help needed standing up from a chair using your arms (e.g., wheelchair or bedside chair)?: None Help needed to walk in hospital room?: A Little Help needed climbing 3-5 steps with a railing? : A Little 6 Click Score: 22    End of Session Equipment Utilized During Treatment: Gait belt Activity Tolerance: Patient tolerated treatment well Patient left: in bed;with call bell/phone within reach Nurse Communication: Mobility status PT Visit Diagnosis: Unsteadiness on feet (R26.81);Difficulty in walking, not elsewhere classified (R26.2)    Time: 8657-8469 PT Time Calculation (min) (ACUTE ONLY): 14 min   Charges:   PT Evaluation $PT Eval Low Complexity: [facility] Acute [facility] Services Pager [phone] Office [phone]   Claretha Cooper 01/10/2020, 1:45 PM

## 2020-01-10 NOTE — Progress Notes (Signed)
Bilateral lower extremity venous duplex completed. Refer to "CV Proc" under chart review to view preliminary results.  01/10/2020 3:28 PM Kelby Aline., MHA, RVT, RDCS, RDMS

## 2020-01-11 DIAGNOSIS — R066 Hiccough: Secondary | ICD-10-CM

## 2020-01-11 DIAGNOSIS — C220 Liver cell carcinoma: Secondary | ICD-10-CM | POA: Diagnosis not present

## 2020-01-11 LAB — GLUCOSE, CAPILLARY
Glucose-Capillary: 145 mg/dL — ABNORMAL HIGH (ref 70–99)
Glucose-Capillary: 157 mg/dL — ABNORMAL HIGH (ref 70–99)
Glucose-Capillary: 195 mg/dL — ABNORMAL HIGH (ref 70–99)
Glucose-Capillary: 188 mg/dL — ABNORMAL HIGH (ref 70–99)

## 2020-01-11 LAB — COMPREHENSIVE METABOLIC PANEL
ALT: 78 U/L — ABNORMAL HIGH (ref 0–44)
AST: 130 U/L — ABNORMAL HIGH (ref 15–41)
Albumin: 2.3 g/dL — ABNORMAL LOW (ref 3.5–5.0)
Alkaline Phosphatase: 220 U/L — ABNORMAL HIGH (ref 38–126)
Anion gap: 9 (ref 5–15)
BUN: 27 mg/dL — ABNORMAL HIGH (ref 8–23)
CO2: 23 mmol/L (ref 22–32)
Calcium: 8.1 mg/dL — ABNORMAL LOW (ref 8.9–10.3)
Chloride: 102 mmol/L (ref 98–111)
Creatinine, Ser: 1.07 mg/dL (ref 0.61–1.24)
GFR, Estimated: >60 mL/min (ref >60)
Glucose, Bld: 182 mg/dL — ABNORMAL HIGH (ref 70–99)
Potassium: 3.9 mmol/L (ref 3.5–5.1)
Sodium: 134 mmol/L — ABNORMAL LOW (ref 135–145)
Total Bilirubin: 2.7 mg/dL — ABNORMAL HIGH (ref 0.3–1.2)
Total Protein: 6.5 g/dL (ref 6.5–8.1)

## 2020-01-11 LAB — CBC WITH DIFFERENTIAL/PLATELET
Abs Immature Granulocytes: 0.3 10*3/uL — ABNORMAL HIGH (ref 0.00–0.07)
Basophils Absolute: 0 10*3/uL (ref 0.0–0.1)
Basophils Relative: 0 %
Eosinophils Absolute: 0.2 10*3/uL (ref 0.0–0.5)
Eosinophils Relative: 2 %
HCT: 34.2 % — ABNORMAL LOW (ref 39.0–52.0)
Hemoglobin: 11.7 g/dL — ABNORMAL LOW (ref 13.0–17.0)
Immature Granulocytes: 3 %
Lymphocytes Relative: 12 %
Lymphs Abs: 1.3 10*3/uL (ref 0.7–4.0)
MCH: 26.4 pg (ref 26.0–34.0)
MCHC: 34.2 g/dL (ref 30.0–36.0)
MCV: 77 fL — ABNORMAL LOW (ref 80.0–100.0)
Monocytes Absolute: 1.2 10*3/uL — ABNORMAL HIGH (ref 0.1–1.0)
Monocytes Relative: 11 %
Neutro Abs: 7.7 10*3/uL (ref 1.7–7.7)
Neutrophils Relative %: 72 %
Platelets: 469 10*3/uL — ABNORMAL HIGH (ref 150–400)
RBC: 4.44 MIL/uL (ref 4.22–5.81)
RDW: 15.7 % — ABNORMAL HIGH (ref 11.5–15.5)
WBC: 10.8 10*3/uL — ABNORMAL HIGH (ref 4.0–10.5)
nRBC: 0.3 % — ABNORMAL HIGH (ref 0.0–0.2)

## 2020-01-11 LAB — PROTIME-INR
INR: 1.2 (ref 0.8–1.2)
Prothrombin Time: 14.4 seconds (ref 11.4–15.2)

## 2020-01-11 LAB — APTT: aPTT: 48 seconds — ABNORMAL HIGH (ref 24–36)

## 2020-01-11 LAB — PHOSPHORUS: Phosphorus: 2.4 mg/dL — ABNORMAL LOW (ref 2.5–4.6)

## 2020-01-11 LAB — MAGNESIUM: Magnesium: 2.8 mg/dL — ABNORMAL HIGH (ref 1.7–2.4)

## 2020-01-11 MED ORDER — BISACODYL 5 MG PO TBEC
10.0000 mg | DELAYED_RELEASE_TABLET | Freq: Every day | ORAL | Status: DC
  Administered 2020-01-12: 10 mg via ORAL
  Filled 2020-01-11: qty 2

## 2020-01-11 NOTE — Care Management Important Message (Signed)
Important Message  Patient Details IM Letter given to the Patient Name: [name] MRN: [id number] Date of Birth: Nov 13, 1950   Medicare Important Message Given:  Yes     Kerin Salen 01/11/2020, 11:30 AM

## 2020-01-11 NOTE — Consult Note (Signed)
   [facility] CM Inpatient Consult   01/11/2020  ARIK HUSMANN [birth year] [id number]   Patient is currently active with [facility] for chronic disease management services.  Patient has been engaged by a [place] [employer] fundraiser. Will make community RN aware of patient admission.  Will continue to follow for progression and disposition.   Of note, [facility] Care Management services does not replace or interfere with any services that are arranged by inpatient case management or social work.  Netta Cedars, MSN, [facility] Liaison Nurse Mobile Phone [phone]  Toll free office [phone]

## 2020-01-11 NOTE — Progress Notes (Addendum)
HEMATOLOGY-ONCOLOGY PROGRESS NOTE  SUBJECTIVE: Pain controlled this morning.  Resting quietly.  CTA chest obtained over the weekend which shows a lytic lesion at the right posterior second rib at the costovertebral junction with extraosseous soft tissue component and bony expansion suspicious for metastatic disease.  Oncology History  Cancer, hepatocellular ([place])  03/13/2019 Initial Diagnosis   Cancer, hepatocellular ([place])   03/20/2019 -  Chemotherapy   The patient had atezolizumab (TECENTRIQ) 1,200 mg in sodium chloride 0.9 % 250 mL chemo infusion, 1,200 mg, Intravenous, Once, 10 of 12 cycles Administration: 1,200 mg (03/20/2019), 1,200 mg (04/24/2019), 1,200 mg (05/15/2019), 1,200 mg (06/05/2019), 1,200 mg (07/17/2019), 1,200 mg (06/25/2019), 1,200 mg (08/07/2019), 1,200 mg (08/28/2019), 1,200 mg (09/18/2019), 1,200 mg (10/09/2019) bevacizumab-bvzr (ZIRABEV) 1,300 mg in sodium chloride 0.9 % 100 mL chemo infusion, 15 mg/kg = 1,300 mg, Intravenous,  Once, 10 of 12 cycles Administration: 1,300 mg (03/20/2019), 1,300 mg (04/24/2019), 1,300 mg (05/15/2019), 1,300 mg (06/05/2019), 1,300 mg (07/17/2019), 1,300 mg (06/25/2019), 1,300 mg (08/07/2019), 1,300 mg (08/28/2019), 1,300 mg (09/18/2019), 1,300 mg (10/09/2019)  for chemotherapy treatment.     PHYSICAL EXAMINATION:  Vitals:   01/10/20 2058 01/11/20 0455  BP: (!) 150/73 (!) 155/84  Pulse: (!) 101 96  Resp: 18 16  Temp: 98.2 F (36.8 C) (!) 97.4 F (36.3 C)  SpO2: 98% 100%   Filed Weights   01/11/20 0455  Weight: 94.1 kg    Intake/Output from previous day: 11/07 0701 - 11/08 0700 In: 366.6 [IV Piggyback:366.6] Out: 525 [Urine:525]  GENERAL:alert, no distress and comfortable OROPHARYNX: No thrush LUNGS: clear to auscultation and percussion with normal breathing effort HEART: regular rate & rhythm and no murmurs and no lower extremity edema ABDOMEN: Positive bowel sounds, soft, tenderness with palpation NEURO: alert & oriented x 3 with fluent speech, no  focal motor/sensory deficits Musculoskeletal: Tender at the lateral left thigh  LABORATORY DATA:  I have reviewed the data as listed CMP Latest Ref Rng & Units 01/11/2020 01/10/2020 01/09/2020  Glucose 70 - 99 mg/dL 182(H) 115(H) 99  BUN 8 - 23 mg/dL 27(H) 22 24(H)  Creatinine 0.61 - 1.24 mg/dL 1.07 1.05 1.24  Sodium 135 - 145 mmol/L 134(L) 132(L) 131(L)  Potassium 3.5 - 5.1 mmol/L 3.9 4.2 4.2  Chloride 98 - 111 mmol/L 102 103 100  CO2 22 - 32 mmol/L 23 22 19(L)  Calcium 8.9 - 10.3 mg/dL 8.1(L) 7.6(L) 7.6(L)  Total Protein 6.5 - 8.1 g/dL 6.5 6.4(L) 6.9  Total Bilirubin 0.3 - 1.2 mg/dL 2.7(H) 3.0(H) 3.3(H)  Alkaline Phos 38 - 126 U/L 220(H) 162(H) 180(H)  AST 15 - 41 U/L 130(H) 126(H) 184(H)  ALT 0 - 44 U/L 78(H) 72(H) 83(H)    Lab Results  Component Value Date   WBC 10.8 (H) 01/11/2020   HGB 11.7 (L) 01/11/2020   HCT 34.2 (L) 01/11/2020   MCV 77.0 (L) 01/11/2020   PLT 469 (H) 01/11/2020   NEUTROABS 7.7 01/11/2020    DG Chest 2 View  Result Date: 01/09/2020 CLINICAL DATA:  69 year old male with history of shortness of breath. EXAM: CHEST - 2 VIEW COMPARISON:  Chest x-ray 01/07/2020. FINDINGS: Elevation of the right hemidiaphragm. Lung volumes are normal. No consolidative airspace disease. No pleural effusions. No pneumothorax. No pulmonary nodule or mass noted. Pulmonary vasculature and the cardiomediastinal silhouette are within normal limits. IMPRESSION: 1. No radiographic evidence of acute cardiopulmonary disease. 2. Elevation of the right hemidiaphragm, similar to the prior study. Electronically Signed   By: Vinnie Langton  M.D.   On: 01/09/2020 18:46   DG Pelvis 1-2 Views  Result Date: 01/08/2020 CLINICAL DATA:  Left hip pain after fall several days ago. EXAM: PELVIS - 1-2 VIEW COMPARISON:  CT abdomen pelvis from yesterday. FINDINGS: No acute fracture or dislocation. Unchanged mild right greater than left hip joint space narrowing with marginal osteophytes. Unchanged increased  subchondral sclerosis in the right femoral head. The pubic symphysis and sacroiliac joints are unremarkable. Soft tissues are unremarkable. Extensive vascular calcifications. IMPRESSION: 1. No acute osseous abnormality. 2. 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 Electronically Signed   By: Titus Dubin M.D.   On: 01/08/2020 19:09   CT ANGIO CHEST PE W OR WO CONTRAST  Result Date: 01/09/2020 CLINICAL DATA:  Pulmonary embolism (PE), suspected Tachycardia and tachypnea. Patient has history of hepatocellular carcinoma. EXAM: CT ANGIOGRAPHY CHEST WITH CONTRAST TECHNIQUE: Multidetector CT imaging of the chest was performed using the standard protocol during bolus administration of intravenous contrast. Multiplanar CT image reconstructions and MIPs were obtained to evaluate the vascular anatomy. CONTRAST:  179mL OMNIPAQUE IOHEXOL 350 MG/ML SOLN COMPARISON:  Radiograph earlier today.  Chest CT a 01/20/2019 FINDINGS: Cardiovascular: There are no filling defects within the pulmonary arteries to suggest pulmonary embolus. Evaluation is diagnostic to the segmental level. The subsegmental branches are not well assessed due to breathing motion artifact and contrast bolus timing. Aortic atherosclerosis without dissection or aneurysm. Heart is normal in size. There is a small pericardial effusion abutting the right heart border. Mediastinum/Nodes: Patulous esophagus with fluid level distally and scattered areas of wall thickening. There is a prominent paraesophageal node adjacent to the distal esophagus measuring 7 mm, series 4, image 64. No enlarged mediastinal or hilar lymph nodes. No suspicious thyroid nodule. Lungs/Pleura: Small right pleural effusion with adjacent compressive atelectasis. No airspace consolidation. No pulmonary mass. Ill-defined subpleural opacities in the right upper lobe are nonspecific. Upper Abdomen: Prominent size liver with nodular contours consistent with cirrhosis. No acute  upper abdominal findings. Musculoskeletal: There is a lytic lesion involving the right posterior second rib at the costovertebral junction with extraosseous soft tissue component and bony expansion, series 6, image 15. This is new from prior exam. Lucent lesion within the left lateral fifth rib, this is indeterminate and was present on prior exam, not significantly changed. Review of the MIP images confirms the above findings. IMPRESSION: 1. No pulmonary embolus. 2. Small right pleural effusion with adjacent compressive atelectasis. 3. Lytic lesion involving the right posterior second rib at the costovertebral junction with extraosseous soft tissue component and bony expansion, suspicious for metastatic disease. Lucent lesion within the left lateral fifth rib, also present on exam 1 year ago and not significantly changed, nonspecific. Bone scan could be considered for whole-body osseous evaluation. 4. Patulous esophagus with fluid level distally and scattered areas of wall thickening. Prominent paraesophageal node measuring 7 mm, nonspecific. Possibility of esophagitis or reflux is raised. 5. Cirrhosis. Aortic Atherosclerosis (ICD10-I70.0). These results will be called to the ordering clinician or representative by the Radiologist Assistant, and communication documented in the PACS or [employer]. Electronically Signed   By: Keith Rake M.D.   On: 01/09/2020 23:41   CT Abdomen Pelvis W Contrast  Result Date: 01/07/2020 CLINICAL DATA:  Abdominal pain, fever. EXAM: CT ABDOMEN AND PELVIS WITH CONTRAST TECHNIQUE: Multidetector CT imaging of the abdomen and pelvis was performed using the standard protocol following bolus administration of intravenous contrast. CONTRAST:  120mL OMNIPAQUE IOHEXOL 300 MG/ML  SOLN COMPARISON:  February 18, 2019.  October 27, 2019. FINDINGS: Lower chest: Minimal right pleural effusion is noted with adjacent subsegmental atelectasis. Hepatobiliary: No gallstones or biliary  dilatation is noted. Nodular hepatic contours are noted concerning for cirrhosis. Multiple ill-defined low densities are noted throughout hepatic parenchyma consistent with the history of hepatocellular carcinoma. Pancreas: Unremarkable. No pancreatic ductal dilatation or surrounding inflammatory changes. Spleen: Normal in size without focal abnormality. Adrenals/Urinary Tract: Adrenal glands are unremarkable. Kidneys are normal, without renal calculi, focal lesion, or hydronephrosis. Bladder is unremarkable. Stomach/Bowel: Stomach is within normal limits. Appendix appears normal. No evidence of bowel wall thickening, distention, or inflammatory changes. Vascular/Lymphatic: Aortic atherosclerosis. No enlarged abdominal or pelvic lymph nodes. Reproductive: Prostate is unremarkable. Other: No abdominal wall hernia or abnormality. No abdominopelvic ascites. Musculoskeletal: No acute or significant osseous findings. IMPRESSION: 1. Nodular hepatic contours are noted concerning for cirrhosis. Multiple ill-defined low densities are noted throughout hepatic parenchyma consistent with the history of hepatocellular carcinoma as noted on prior MRI. 2. Minimal right pleural effusion is noted with adjacent subsegmental atelectasis. 3. Aortic atherosclerosis. Aortic Atherosclerosis (ICD10-I70.0). Electronically Signed   By: Marijo Conception M.D.   On: 01/07/2020 14:55   DG Chest Port 1 View  Result Date: 01/07/2020 CLINICAL DATA:  Sepsis. EXAM: PORTABLE CHEST 1 VIEW COMPARISON:  January 20, 2019. FINDINGS: The heart size and mediastinal contours are within normal limits. Both lungs are clear. The visualized skeletal structures are unremarkable. IMPRESSION: No active disease. Electronically Signed   By: Marijo Conception M.D.   On: 01/07/2020 13:31   DG FEMUR MIN 2 VIEWS LEFT  Result Date: 01/08/2020 CLINICAL DATA:  Left hip pain following fall several days ago, initial encounter EXAM: LEFT FEMUR 2 VIEWS COMPARISON:  None.  FINDINGS: Degenerative changes of left hip joint are noted. No acute fracture or dislocation is noted. Heavy atherosclerotic calcifications are noted. 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 No soft tissue abnormality is noted. IMPRESSION: No acute abnormality noted.  Degenerative changes are seen. Electronically Signed   By: Inez Catalina M.D.   On: 01/08/2020 19:03   VAS [place] LOWER EXTREMITY VENOUS (DVT)  Result Date: 01/10/2020  Lower Venous DVT Study Indications: Elevated d-dimer=6.55. Rhabdomyolysis. Hepatocellular carcinoma.  Limitations: Poor ultrasound/tissue interface. Comparison Study: No prior study Performing Technologist: Maudry Mayhew MHA, RDMS, RVT, RDCS  Examination Guidelines: A complete evaluation includes B-mode imaging, spectral Doppler, color Doppler, and power Doppler as needed of all accessible portions of each vessel. Bilateral testing is considered an integral part of a complete examination. Limited examinations for reoccurring indications may be performed as noted. The reflux portion of the exam is performed with the patient in reverse Trendelenburg.  +---------+---------------+---------+-----------+----------+--------------+ RIGHT    CompressibilityPhasicitySpontaneityPropertiesThrombus Aging +---------+---------------+---------+-----------+----------+--------------+ CFV      Full           Yes      Yes                                 +---------+---------------+---------+-----------+----------+--------------+ SFJ      Full                                                        +---------+---------------+---------+-----------+----------+--------------+ FV Prox  Full                                                        +---------+---------------+---------+-----------+----------+--------------+  FV Mid   Full                                                        +---------+---------------+---------+-----------+----------+--------------+ FV DistalFull                                                         +---------+---------------+---------+-----------+----------+--------------+ PFV      Full                                                        +---------+---------------+---------+-----------+----------+--------------+ POP      Full           Yes      Yes                                 +---------+---------------+---------+-----------+----------+--------------+ PTV      Full                                                        +---------+---------------+---------+-----------+----------+--------------+ PERO     Full                                                        +---------+---------------+---------+-----------+----------+--------------+   +---------+---------------+---------+-----------+----------+--------------+ LEFT     CompressibilityPhasicitySpontaneityPropertiesThrombus Aging +---------+---------------+---------+-----------+----------+--------------+ CFV      Full           Yes      Yes                                 +---------+---------------+---------+-----------+----------+--------------+ SFJ      Full                                                        +---------+---------------+---------+-----------+----------+--------------+ FV Prox  Full                                                        +---------+---------------+---------+-----------+----------+--------------+ FV Mid   Full                                                        +---------+---------------+---------+-----------+----------+--------------+  FV DistalFull                                                        +---------+---------------+---------+-----------+----------+--------------+ PFV      Full                                                        +---------+---------------+---------+-----------+----------+--------------+ POP      Full           Yes      Yes                                  +---------+---------------+---------+-----------+----------+--------------+ PTV      Full                                                        +---------+---------------+---------+-----------+----------+--------------+ PERO     Full                                                        +---------+---------------+---------+-----------+----------+--------------+     Summary: RIGHT: - There is no evidence of deep vein thrombosis in the lower extremity.  - No cystic structure found in the popliteal fossa.  LEFT: - There is no evidence of deep vein thrombosis in the lower extremity.  - No cystic structure found in the popliteal fossa.  *See table(s) above for measurements and observations. Electronically signed by Servando Snare MD on 01/10/2020 at 8:34:30 PM.    Final    US ABDOMEN LIMITED RUQ (LIVER/GB)  Result Date: 01/08/2020 CLINICAL DATA:  Right upper quadrant pain EXAM: ULTRASOUND ABDOMEN LIMITED RIGHT UPPER QUADRANT COMPARISON:  Abdomen and pelvis CT from yesterday FINDINGS: Gallbladder: No gallstones or wall thickening visualized. No sonographic Murphy sign noted by sonographer. Common bile duct: Diameter: 3 mm Liver: Cirrhotic liver morphology including capsular nodularity. There is diffuse heterogeneity from hepatocellular disease and masses by prior CT and MRI. Portal vein is patent on color Doppler imaging with normal direction of blood flow towards the liver. IMPRESSION: 1. Negative gallbladder. 2. Known cirrhosis and hepatocellular carcinoma. Electronically Signed   By: Monte Fantasia M.D.   On: 01/08/2020 11:01    ASSESSMENT AND PLAN: 1. Hepatocellular carcinoma  02/12/2019 AFP 10,200  02/18/2019 CT abdomen-dominant poorly marginated heterogeneously enhancing 8.6 x 7.7 cm posterior right liver lobe mass, new compared to CT study 09/10/2016. Numerous similar-appearing liver masses scattered throughout the liver all new since 09/10/2016 CT. Diffusely irregular liver  surface compatible with cirrhosis. Spleen normal size. No ascites. No abdominal adenopathy.  MRI liver 03/05/2019-multifocal hepatic metastases including a 10 cm lesion in the right liver,Li-rads 5, no evidence of metastatic disease  Cycle 1 atezolizumab/bevacizumab 03/20/2019  Cycle 2 atezolizumab/bevacizumab 04/24/2019  Cycle 3 atezolizumab/bevacizumab 05/15/2019  Cycle 4 atezolizumab/bevacizumab 06/05/2019  MRI liver 06/24/2019-multifocal hepatocellular carcinoma, lesions stable to decreased in size. No evidence for disease progression. Cirrhosis.  Cycle 5 atezolizumab/bevacizumab 06/25/2019  Cycle 6 atezolizumab/bevacizumab 07/17/2019  Cycle 7 atezolizumab/bevacizumab 08/07/2019  Cycle 8 atezolizumab/bevacizumab 08/28/2019  Cycle 9atezolizumab/bevacizumab 09/18/2019  Cycle 10 atezolizumab/bevacizumab 10/09/2019  MRI liver 10/27/2019-enlargement of multifocal liver lesions, cirrhosis  Lenvatinib beginning 11/10/2019, held starting 11/23/2019 secondary to diarrhea and mucositis  Lenvatinib resumed at 4 mg daily beginning 12/01/2019 2. 01/20/2019 chest CT-no definite evidence of pulmonary embolus. Wall thickening of the distal esophagus. Nodular hepatic contours. 3. Cirrhosis  4. Hepatitis C status post Harvoni 5. Type 2 diabetes 6. COPD 7. CAD 8. Chronic diastolic heart failure 9. Hypertension 10. Positive Cologuard 2019 11. Diarrhea and mucositis 11/23/2019 12. Dehydration secondary to diarrhea 11/23/2019 13. Right arm/shoulder pain-plain x-ray right shoulder with slight irregularity inferior glenoid, no fracture or dislocation; right humerus with mild degenerative changes in the acromioclavicular joint, thinning humeral joint, no acute fracture or dislocation and no lytic or sclerotic lesion noted.Pain resolved 11/30/2019.  CT 12/10/2019-suspected full-thickness supraspinatus tear with mild muscle atrophy.  3 x 4 x 9 mm oval lucent lesion in the posterior cortex of the proximal  humeral metaphysis, nonspecific, unlikely to represent metastatic disease; moderate acromioclavicular and mild glenohumeral osteoarthritis. [facility] admission 01/07/2020-sepsis   Mr. Dayhoff was admitted to the hospital with suspected sepsis.  Source of infection is unclear.  Remains afebrile.  Blood cultures negative at 4 days and urine culture with insignificant growth. He remains on IV antibiotics.  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  Pain adequately controlled on current pain medications.  Pain is likely related to the hepatic tumor burden. The markedly elevated liver enzymes on hospital admission have returned to baseline, but the bilirubin remains elevated. Recommendations: 1.  Continue to hold lenvatinib.  May resume upon discharge. 2.  Will add on AFP to this morning's labs. 3.  Recommend PT/OT consult for ambulation. 4.  The patient may discharged home from our standpoint when otherwise medically stable as long as his pain is controlled and he is ambulating. 5.  Consider a therapeutic paracentesis with fluid to be sent for culture and cytology   LOS: 4 days   Mikey Bussing, DNP, AGPCNP-BC, AOCNP 01/11/20 Mr. Goeller was interviewed and examined.  He was admitted with suspected sepsis.  No clear source for infection is been identified.  He remains afebrile.  I suspect he had sepsis syndrome related to the hepatic tumor burden.  His pain is likely related to metastatic disease involving the liver.  We will check an AFP for restaging.  It is difficult to compare the current CT to previous MRI scans.  I will ask radiology to compare the current abdomen CT to the August 2021 liver MRI.  Systemic treatment options will be limited if there is clear disease progression.  He will not be a candidate for hepatic directed therapy if there is confirmed disease outside of the liver.  Outpatient follow-up will be scheduled at the Cancer center.

## 2020-01-11 NOTE — Progress Notes (Addendum)
PROGRESS NOTE  TREVYON SWOR XBM:[id number] DOB: [birth year] DOA: 01/07/2020 PCP: Hoyt Koch, MD   LOS: 4 days   Brief narrative: As per HPI,  69 year old male with history of hepatocellular cancer status post chemotherapy, currently on  lenvatinib followed by Dr. Benay Spice oncology as outpatient, alcoholism, hep C status post Harvoni, IDDM-2, PAD, COPD, HTN, a presented to hospital brought in by EMS after falling from his bed and feeling sick.  He had been complaining of nausea and right-sided abdominal pain but there was no history of impact after the fall.  In the ED patient was noted to have fever of 102 F with tachycardia and mild leukocytosis.  He had elevated lactate on presentation with hyponatremia and creatinine was elevated at 1.4.  Liver function tests were elevated and INR was 1.3.  Respiratory viral panel was negative.  Urinalysis was negative chest x-ray showed no acute findings.  CT scan of the abdomen and pelvis showed features concerning for cirrhosis of liver and hepatocellular cancer.  Patient was started on cefepime Flagyl and was admitted to the hospital.    Subsequently, Urine culture showed 7000 colonies of Enterococcus faecalis and antibiotics were changed to Unasyn 11/06.   Patient also had respiratory distress with tachypnea tachycardia IV fluids were discontinued.  Chest x-ray without any acute finding but CT angiogram of the chest was performed which showed no evidence of pulmonary bolus but lytic lesions in the right second rib.  Lower extremity duplex ultrasound was negative for DVT.  Assessment/Plan:  Principal Problem:   Abdominal pain Active Problems:   Essential hypertension, benign   GERD (gastroesophageal reflux disease)   Hepatic cirrhosis (HCC)   Cancer, hepatocellular (HCC)  Severe sepsis due to Enterococcus UTI.  Present on admission.  Patient met sepsis criteria with fever, tachycardia and tachypnea and lactic acidosis, AKI and elevated liver  enzymes.  Urine culture with Enterococcus faecalis.  Blood cultures no growth to date.  Patient initially received IV cefepime and IV Flagyl 11/4-11/6.  This was changed to Unasyn 11/6.  We will plan for 7-day total antibiotic.   Hepatocellular carcinoma/liver cirrhosis:  Patient follows up with oncology Dr Learta Codding as outpatient.  Seen by oncology during this hospitalization.    Right-sided abdominal, chest and neck pain-suspect this is related to [facility] and possible diaphragmatic irritation.    Patient also continues to have hiccups and has been on different pain medication regimen as outpatient.  On chlorpromazine at this time.  Lenvatinib on hold at this time.  Could continue on discharge.  Continue pain relief with oxycodone and fentanyl  Dyspnea:  Likely secondary to elevated diaphragm/diaphragmatic irritation.  CT angiogram of the chest without any pulmonary embolism or pneumonia.  Elevated D-dimer: Nonspecific.  CTA chest negative for PE.  Ultrasound of the lower extremity without any PE.  Fall at home: Reportedly fell out of bed and landed on carpeted floor. No head impact or LOC.   Rhabdomyolysis: Likely traumatic from fall.  CK 771>> 303.  Encourage oral fluids.  Improved at this time.  Elevated liver enzymes:  Hepatitis panel negative.  Has baseline elevated LFTs from liver cancer.  Likely secondary to elevated CK. statin on hold.  Coagulopathy: Secondary to cirrhosis/hepatocellular carcinoma.  No evidence of bleeding.  Diabetes mellitus type 2.: A1c 7.1%.    On insulin regimen at home.  Continue sliding scale insulin Accu-Cheks diabetic diet.  Acute kidney injury.  Likely due to rhabdomyolysis.  Resolved.  Check BMP in a.m.  Essential  hypertension: On lisinopril/HCTZ at home.  Currently on lisinopril and amlodipine has been added.  Hyperlipidemia: Continue on hold at this time.  Chronic COPD:  Continue supportive care.  Continue albuterol inhaler.  Right rotator cuff  injury: Chronic.  Pain control PT OT.  Constipation:  On MiraLAX and Senokot-S.  On MiraLAX twice a day and Senokot twice a day.  We will try 1 dose of Dulcolax tonight.  DVT prophylaxis: heparin injection 5,000 Units Start: 01/07/20 2200   Code Status: Full code  Family Communication: Spoke with the patient's sister on the phone and updated her about the clinical condition of the patient.  Status is: Inpatient  Remains inpatient appropriate because:IV treatments appropriate due to intensity of illness or inability to take PO and Inpatient level of care appropriate due to severity of illness   Dispo: The patient is from: Home              Anticipated d/c is to: Home              Anticipated d/c date is: 1 day              Patient currently is not medically stable to d/c.   Consultants:  Oncology  Procedures:  None  Antibiotics:  . Unasyn 11/6>  Anti-infectives (From admission, onward)   Start     Dose/Rate Route Frequency Ordered Stop   01/09/20 0800  Ampicillin-Sulbactam (UNASYN) 3 g in sodium chloride 0.9 % 100 mL IVPB        3 g 200 mL/hr over 30 Minutes Intravenous Every 6 hours 01/09/20 0653     01/08/20 1400  metroNIDAZOLE (FLAGYL) tablet 500 mg  Status:  Discontinued        500 mg Oral Every 8 hours 01/08/20 1105 01/09/20 0653   01/08/20 1400  ceFEPIme (MAXIPIME) 2 g in sodium chloride 0.9 % 100 mL IVPB  Status:  Discontinued        2 g 200 mL/hr over 30 Minutes Intravenous Every 8 hours 01/08/20 1219 01/08/20 1225   01/08/20 1400  ceFEPIme (MAXIPIME) 2 g in sodium chloride 0.9 % 100 mL IVPB  Status:  Discontinued        2 g 200 mL/hr over 30 Minutes Intravenous Every 12 hours 01/08/20 1225 01/09/20 0653   01/08/20 0200  ceFEPIme (MAXIPIME) 2 g in sodium chloride 0.9 % 100 mL IVPB  Status:  Discontinued        2 g 200 mL/hr over 30 Minutes Intravenous Every 12 hours 01/07/20 1848 01/08/20 1219   01/07/20 2200  metroNIDAZOLE (FLAGYL) IVPB 500 mg  Status:   Discontinued        500 mg 100 mL/hr over 60 Minutes Intravenous Every 8 hours 01/07/20 1824 01/08/20 1105   01/07/20 1430  metroNIDAZOLE (FLAGYL) IVPB 500 mg       "And" Linked Group Details   500 mg 100 mL/hr over 60 Minutes Intravenous  Once 01/07/20 1327 01/07/20 1510   01/07/20 1400  ceFEPIme (MAXIPIME) 2 g in sodium chloride 0.9 % 100 mL IVPB       "And" Linked Group Details   2 g 200 mL/hr over 30 Minutes Intravenous  Once 01/07/20 1327 01/07/20 1510     Subjective: Today, patient was seen and examined at bedside.  Patient feels uncomfortable restless and has right upper quadrant pain with hiccups.  Does not feel better.  Has not had a bowel movement in almost 2 weeks now.  Objective: Vitals:  01/11/20 0455 01/11/20 1319  BP: (!) 155/84 (!) 149/67  Pulse: 96 (!) 101  Resp: 16 20  Temp: (!) 97.4 F (36.3 C) 98 F (36.7 C)  SpO2: 100% 98%    Intake/Output Summary (Last 24 hours) at 01/11/2020 1538 Last data filed at 01/11/2020 1321 Gross per 24 hour  Intake 240 ml  Output 925 ml  Net -685 ml   Filed Weights   01/11/20 0455  Weight: 94.1 kg   Body mass index is 35.61 kg/m.   Physical Exam: GENERAL: Patient is alert awake and oriented. Not in obvious distress, obese, mildly uncomfortable.  With hiccups. HENT: No scleral pallor or icterus. Pupils equally reactive to light. Oral mucosa is moist NECK: is supple, no gross swelling noted. CHEST: Clear to auscultation. No crackles or wheezes.  Diminished breath sounds bilaterally. CVS: S1 and S2 heard, no murmur. Regular rate and rhythm.  ABDOMEN: Soft, right upper quadrant tenderness on palpation, bowel sounds are present. EXTREMITIES: No edema. CNS: Cranial nerves are intact. No focal motor deficits. SKIN: warm and dry without rashes.  Data Review: I have personally reviewed the following laboratory data and studies,  CBC: Recent Labs  Lab 01/07/20 1229 01/07/20 1229 01/07/20 1847 01/08/20 0722  01/09/20 0558 01/10/20 0548 01/11/20 0434  WBC 11.7*   < > 10.4 8.5 10.8* 11.1* 10.8*  NEUTROABS 8.5*  --   --   --  7.6 8.1* 7.7  HGB 15.0   < > 13.9 12.1* 12.0* 11.9* 11.7*  HCT 43.3   < > 41.0 36.6* 35.9* 34.3* 34.2*  MCV 77.3*   < > 78.5* 78.4* 78.2* 76.7* 77.0*  PLT 510*   < > 438* 409* 448* 445* 469*   < > = values in this interval not displayed.   Basic Metabolic Panel: Recent Labs  Lab 01/07/20 1229 01/07/20 1229 01/07/20 1847 01/08/20 0722 01/09/20 0558 01/10/20 0548 01/11/20 0434  NA 129*  --   --  127* 131* 132* 134*  K 4.4  --   --  4.4 4.2 4.2 3.9  CL 96*  --   --  96* 100 103 102  CO2 20*  --   --  21* 19* 22 23  GLUCOSE 137*  --   --  147* 99 115* 182*  BUN 25*  --   --  19 24* 22 27*  CREATININE 1.49*   < > 1.23 1.34* 1.24 1.05 1.07  CALCIUM 8.4*  --   --  7.5* 7.6* 7.6* 8.1*  MG  --   --   --  1.8 2.1 2.5* 2.8*  PHOS  --   --   --  2.8 2.7 2.2* 2.4*   < > = values in this interval not displayed.   Liver Function Tests: Recent Labs  Lab 01/07/20 1847 01/08/20 0722 01/09/20 0558 01/10/20 0548 01/11/20 0434  AST 483* 297* 184* 126* 130*  ALT 129* 101* 83* 72* 78*  ALKPHOS 201* 160* 180* 162* 220*  BILITOT 3.7* 3.4* 3.3* 3.0* 2.7*  PROT 6.9 6.4* 6.9 6.4* 6.5  ALBUMIN 2.7* 2.3* 2.5* 2.2* 2.3*   Recent Labs  Lab 01/07/20 1847  LIPASE 15   No results for input(s): AMMONIA in the last 168 hours. Cardiac Enzymes: Recent Labs  Lab 01/08/20 0722 01/09/20 0558  CKTOTAL 771* 303   BNP (last 3 results) Recent Labs    01/09/20 0558  BNP 64.0    ProBNP (last 3 results) No results for input(s): PROBNP in the  last 8760 hours.  CBG: Recent Labs  Lab 01/10/20 1120 01/10/20 1650 01/10/20 2101 01/11/20 0740 01/11/20 1122  GLUCAP 199* 161* 181* 145* 157*   Recent Results (from the past 240 hour(s))  Blood Culture (routine x 2)     Status: None (Preliminary result)   Collection Time: 01/07/20 12:30 PM   Specimen: BLOOD  Result Value Ref  Range Status   Specimen Description   Final    BLOOD RIGHT ANTECUBITAL Performed at [facility] [address], [place], [place] [address]    Special Requests   Final    BOTTLES DRAWN AEROBIC AND ANAEROBIC Blood Culture adequate volume Performed at [place] [address] [place], [place] [address]    Culture   Final    NO GROWTH 4 DAYS Performed at [facility], [place] [address] [place], [place] [address]    Report Status PENDING  Incomplete  Respiratory Panel by RT PCR (Flu A&B, Covid) - Nasopharyngeal Swab     Status: None   Collection Time: 01/07/20 12:30 PM   Specimen: Nasopharyngeal Swab  Result Value Ref Range Status   SARS Coronavirus 2 by RT PCR NEGATIVE NEGATIVE Final    Comment: (NOTE) SARS-CoV-2 target nucleic acids are NOT DETECTED.  The SARS-CoV-2 RNA is generally detectable in upper respiratoy specimens during the acute phase of infection. The lowest concentration of SARS-CoV-2 viral copies this assay can detect is 131 copies/mL. A negative result does not preclude SARS-Cov-2 infection and should not be used as the sole basis for treatment or other patient management decisions. A negative result may occur with  improper specimen collection/handling, submission of specimen other than nasopharyngeal swab, presence of viral mutation(s) within the areas targeted by this assay, and inadequate number of viral copies (<131 copies/mL). A negative result must be combined with clinical observations, patient history, and epidemiological information. The expected result is Negative.  Fact Sheet for Patients:  PinkCheek.be  Fact Sheet for Healthcare Providers:  GravelBags.it  This test is no t yet approved or cleared by the [place] FDA and  has been authorized for detection and/or diagnosis of SARS-CoV-2 by FDA under an Emergency Use Authorization (EUA). This  EUA will remain  in effect (meaning this test can be used) for the duration of the COVID-19 declaration under Section 564(b)(1) of the Act, 21 U.S.C. section 360bbb-3(b)(1), unless the authorization is terminated or revoked sooner.     Influenza A by PCR NEGATIVE NEGATIVE Final   Influenza B by PCR NEGATIVE NEGATIVE Final    Comment: (NOTE) The Xpert Xpress SARS-CoV-2/FLU/RSV assay is intended as an aid in  the diagnosis of influenza from Nasopharyngeal swab specimens and  should not be used as a sole basis for treatment. Nasal washings and  aspirates are unacceptable for Xpert Xpress SARS-CoV-2/FLU/RSV  testing.  Fact Sheet for Patients: PinkCheek.be  Fact Sheet for Healthcare Providers: GravelBags.it  This test is not yet approved or cleared by the [place] FDA and  has been authorized for detection and/or diagnosis of SARS-CoV-2 by  FDA under an Emergency Use Authorization (EUA). This EUA will remain  in effect (meaning this test can be used) for the duration of the  Covid-19 declaration under Section 564(b)(1) of the Act, 21  U.S.C. section 360bbb-3(b)(1), unless the authorization is  terminated or revoked. Performed at [facility], [place] [address], [place], [place] [address]   Blood Culture (routine x 2)     Status:  None (Preliminary result)   Collection Time: 01/07/20 12:35 PM   Specimen: BLOOD LEFT HAND  Result Value Ref Range Status   Specimen Description   Final    BLOOD LEFT HAND Performed at [facility], [place] [address] [place], [place] [address]    Special Requests   Final    BOTTLES DRAWN AEROBIC AND ANAEROBIC Blood Culture results may not be optimal due to an inadequate volume of blood received in culture bottles Performed at [place] [address] [place], [place] [address]    Culture   Final    NO GROWTH 4 DAYS Performed at [place], [place] [address] [place], [place] [address]    Report Status PENDING  Incomplete  Urine culture     Status: Abnormal   Collection Time: 01/07/20  3:30 PM   Specimen: In/Out Cath Urine  Result Value Ref Range Status   Specimen Description   Final    IN/OUT CATH URINE Performed at [place] [address] [place], [place] [address]    Special Requests   Final    NONE Performed at [facility], [place] [address], [place], [place] [address]    Culture 7,000 COLONIES/mL ENTEROCOCCUS FAECALIS (A)  Final   Report Status 01/09/2020 FINAL  Final   Organism ID, Bacteria ENTEROCOCCUS FAECALIS (A)  Final      Susceptibility   Enterococcus faecalis - MIC*    AMPICILLIN <=2 SENSITIVE Sensitive     NITROFURANTOIN <=16 SENSITIVE Sensitive     VANCOMYCIN 2 SENSITIVE Sensitive     * 7,000 COLONIES/mL ENTEROCOCCUS FAECALIS     Studies: DG Chest 2 View  Result Date: 01/09/2020 CLINICAL DATA:  69 year old male with history of shortness of breath. EXAM: CHEST - 2 VIEW COMPARISON:  Chest x-ray 01/07/2020. FINDINGS: Elevation of the right hemidiaphragm. Lung volumes are normal. No consolidative airspace disease. No pleural effusions. No pneumothorax. No pulmonary nodule or mass noted. Pulmonary vasculature and the cardiomediastinal silhouette are within normal limits. IMPRESSION: 1. No radiographic evidence of acute cardiopulmonary disease. 2. Elevation of the right hemidiaphragm, similar to the prior study. Electronically Signed   By: Vinnie Langton M.D.   On: 01/09/2020 18:46   CT ANGIO CHEST PE W OR WO CONTRAST  Result Date: 01/09/2020 CLINICAL DATA:  Pulmonary embolism (PE), suspected Tachycardia and tachypnea. Patient has history of hepatocellular carcinoma. EXAM: CT ANGIOGRAPHY CHEST WITH CONTRAST TECHNIQUE: Multidetector CT imaging of the chest was performed using the standard protocol during bolus administration of intravenous contrast. Multiplanar CT image  reconstructions and MIPs were obtained to evaluate the vascular anatomy. CONTRAST:  154mL OMNIPAQUE IOHEXOL 350 MG/ML SOLN COMPARISON:  Radiograph earlier today.  Chest CT a 01/20/2019 FINDINGS: Cardiovascular: There are no filling defects within the pulmonary arteries to suggest pulmonary embolus. Evaluation is diagnostic to the segmental level. The subsegmental branches are not well assessed due to breathing motion artifact and contrast bolus timing. Aortic atherosclerosis without dissection or aneurysm. Heart is normal in size. There is a small pericardial effusion abutting the right heart border. Mediastinum/Nodes: Patulous esophagus with fluid level distally and scattered areas of wall thickening. There is a prominent paraesophageal node adjacent to the distal esophagus measuring 7 mm, series 4, image 64. No enlarged mediastinal or hilar lymph nodes. No suspicious thyroid nodule. Lungs/Pleura: Small right pleural effusion with adjacent compressive atelectasis. No airspace consolidation. No pulmonary mass. Ill-defined subpleural opacities in the right upper lobe are  nonspecific. Upper Abdomen: Prominent size liver with nodular contours consistent with cirrhosis. No acute upper abdominal findings. Musculoskeletal: There is a lytic lesion involving the right posterior second rib at the costovertebral junction with extraosseous soft tissue component and bony expansion, series 6, image 15. This is new from prior exam. Lucent lesion within the left lateral fifth rib, this is indeterminate and was present on prior exam, not significantly changed. Review of the MIP images confirms the above findings. IMPRESSION: 1. No pulmonary embolus. 2. Small right pleural effusion with adjacent compressive atelectasis. 3. Lytic lesion involving the right posterior second rib at the costovertebral junction with extraosseous soft tissue component and bony expansion, suspicious for metastatic disease. Lucent lesion within the left  lateral fifth rib, also present on exam 1 year ago and not significantly changed, nonspecific. Bone scan could be considered for whole-body osseous evaluation. 4. Patulous esophagus with fluid level distally and scattered areas of wall thickening. Prominent paraesophageal node measuring 7 mm, nonspecific. Possibility of esophagitis or reflux is raised. 5. Cirrhosis. Aortic Atherosclerosis (ICD10-I70.0). These results will be called to the ordering clinician or representative by the Radiologist Assistant, and communication documented in the PACS or [employer]. Electronically Signed   By: Keith Rake M.D.   On: 01/09/2020 23:41   VAS [place] LOWER EXTREMITY VENOUS (DVT)  Result Date: 01/10/2020  Lower Venous DVT Study Indications: Elevated d-dimer=6.55. Rhabdomyolysis. Hepatocellular carcinoma.  Limitations: Poor ultrasound/tissue interface. Comparison Study: No prior study Performing Technologist: Maudry Mayhew MHA, RDMS, RVT, RDCS  Examination Guidelines: A complete evaluation includes B-mode imaging, spectral Doppler, color Doppler, and power Doppler as needed of all accessible portions of each vessel. Bilateral testing is considered an integral part of a complete examination. Limited examinations for reoccurring indications may be performed as noted. The reflux portion of the exam is performed with the patient in reverse Trendelenburg.  +---------+---------------+---------+-----------+----------+--------------+ RIGHT    CompressibilityPhasicitySpontaneityPropertiesThrombus Aging +---------+---------------+---------+-----------+----------+--------------+ CFV      Full           Yes      Yes                                 +---------+---------------+---------+-----------+----------+--------------+ SFJ      Full                                                        +---------+---------------+---------+-----------+----------+--------------+ FV Prox  Full                                                         +---------+---------------+---------+-----------+----------+--------------+ FV Mid   Full                                                        +---------+---------------+---------+-----------+----------+--------------+ FV DistalFull                                                        +---------+---------------+---------+-----------+----------+--------------+  PFV      Full                                                        +---------+---------------+---------+-----------+----------+--------------+ POP      Full           Yes      Yes                                 +---------+---------------+---------+-----------+----------+--------------+ PTV      Full                                                        +---------+---------------+---------+-----------+----------+--------------+ PERO     Full                                                        +---------+---------------+---------+-----------+----------+--------------+   +---------+---------------+---------+-----------+----------+--------------+ LEFT     CompressibilityPhasicitySpontaneityPropertiesThrombus Aging +---------+---------------+---------+-----------+----------+--------------+ CFV      Full           Yes      Yes                                 +---------+---------------+---------+-----------+----------+--------------+ SFJ      Full                                                        +---------+---------------+---------+-----------+----------+--------------+ FV Prox  Full                                                        +---------+---------------+---------+-----------+----------+--------------+ FV Mid   Full                                                        +---------+---------------+---------+-----------+----------+--------------+ FV DistalFull                                                         +---------+---------------+---------+-----------+----------+--------------+ PFV      Full                                                        +---------+---------------+---------+-----------+----------+--------------+   POP      Full           Yes      Yes                                 +---------+---------------+---------+-----------+----------+--------------+ PTV      Full                                                        +---------+---------------+---------+-----------+----------+--------------+ PERO     Full                                                        +---------+---------------+---------+-----------+----------+--------------+     Summary: RIGHT: - There is no evidence of deep vein thrombosis in the lower extremity.  - No cystic structure found in the popliteal fossa.  LEFT: - There is no evidence of deep vein thrombosis in the lower extremity.  - No cystic structure found in the popliteal fossa.  *See table(s) above for measurements and observations. Electronically signed by Servando Snare MD on 01/10/2020 at 8:34:30 PM.    Final       Flora Lipps, MD  [facility] 01/11/2020

## 2020-01-12 DIAGNOSIS — K219 Gastro-esophageal reflux disease without esophagitis: Secondary | ICD-10-CM

## 2020-01-12 DIAGNOSIS — K7460 Unspecified cirrhosis of liver: Secondary | ICD-10-CM

## 2020-01-12 LAB — CULTURE, BLOOD (ROUTINE X 2)
Culture: NO GROWTH
Culture: NO GROWTH
Special Requests: ADEQUATE

## 2020-01-12 LAB — GLUCOSE, CAPILLARY
Glucose-Capillary: 134 mg/dL — ABNORMAL HIGH (ref 70–99)
Glucose-Capillary: 164 mg/dL — ABNORMAL HIGH (ref 70–99)
Glucose-Capillary: 224 mg/dL — ABNORMAL HIGH (ref 70–99)

## 2020-01-12 LAB — AFP TUMOR MARKER: AFP, Serum, Tumor Marker: 13676 ng/mL — ABNORMAL HIGH (ref 0.0–8.3)

## 2020-01-12 MED ORDER — POLYETHYLENE GLYCOL 3350 17 G PO PACK: 17.0000 g | PACK | Freq: Every day | ORAL | 0 refills | Status: AC

## 2020-01-12 MED ORDER — AMOXICILLIN 500 MG PO CAPS: 500.0000 mg | ORAL_CAPSULE | Freq: Three times a day (TID) | ORAL | 0 refills | Status: AC

## 2020-01-12 MED ORDER — CHLORPROMAZINE HCL 25 MG PO TABS: 25.0000 mg | ORAL_TABLET | Freq: Four times a day (QID) | ORAL | 0 refills | Status: AC | PRN

## 2020-01-12 MED ORDER — BACLOFEN 10 MG PO TABS
5.0000 mg | ORAL_TABLET | Freq: Three times a day (TID) | ORAL | Status: DC | PRN
  Administered 2020-01-12: 5 mg via ORAL
  Filled 2020-01-12: qty 1

## 2020-01-12 MED ORDER — BACLOFEN 5 MG PO TABS: 5.0000 mg | ORAL_TABLET | Freq: Three times a day (TID) | ORAL | 0 refills | Status: DC | PRN

## 2020-01-12 NOTE — Plan of Care (Signed)
  Problem: Nutrition: Goal: Adequate nutrition will be maintained Outcome: Adequate for Discharge   Problem: Skin Integrity: Goal: Risk for impaired skin integrity will decrease Outcome: Adequate for Discharge   Problem: Safety: Goal: Ability to remain free from injury will improve Outcome: Adequate for Discharge

## 2020-01-12 NOTE — Discharge Summary (Signed)
Physician Discharge Summary  [name] [id number]:[id number] DOB: [birth year] DOA: 01/07/2020  PCP: Hoyt Koch, MD  Admit date: 01/07/2020 Discharge date: 01/12/2020  Admitted From: Home  Discharge disposition: Home  Recommendations for Outpatient Follow-Up:   . Follow up with your primary care provider in one week.  . Check CBC, BMP, magnesium in the next visit . Patient has been prescribed Thorazine and baclofen for hiccups.  Please adjust medications as necessary as outpatient. . Follow-up with oncology for treatment of hepatocellular cancer.  Discharge Diagnosis:   Principal Problem:   Abdominal pain Active Problems:   Essential hypertension, benign   GERD (gastroesophageal reflux disease)   Hepatic cirrhosis (HCC)   Cancer, hepatocellular ([place])   Discharge Condition: Improved.  Diet recommendation:   Regular.  Wound care: None.  Code status: Full.   History of Present Illness:   69 year old male with history of hepatocellular cancer status post chemotherapy, currently on  lenvatinib followed by Dr. Benay Spice oncology as outpatient, alcoholism, hep C status post Harvoni, IDDM-2, PAD, COPD, HTN, a presented to the hospital brought in by EMS after falling from his bed and feeling sick.  He had been complaining of nausea and right-sided abdominal pain but there was no history of impact after the fall.  In the ED, patient was noted to have fever of 102 F with tachycardia and mild leukocytosis.  He had elevated lactate on presentation with hyponatremia and creatinine was elevated at 1.4.  Liver function tests were elevated and INR was 1.3.  Respiratory viral panel was negative.  Urinalysis was negative chest x-ray showed no acute findings.  CT scan of the abdomen and pelvis showed features concerning for cirrhosis of liver and hepatocellular cancer.  Patient was started on cefepime Flagyl and was admitted to the hospital.    Subsequently,Urine culture showed 7000  colonies of Enterococcus faecalis and antibiotics were changed to Unasyn 11/06.  Patient also had respiratory distress with tachypnea, tachycardia.  Chest x-ray did not show any acute finding and CT angiogram of the chest was negative for pulmonary embolus but lytic lesion in the right second rib was identified..  Lower extremity duplex ultrasound was negative for DVT.   Hospital Course:   Following conditions were addressed during hospitalization as listed below,  Severe sepsis due to Enterococcus UTI.  Present on admission.  Patient met sepsis criteria on presentation with fever, tachycardia and tachypnea and lactic acidosis, AKI and elevated liver enzymes. Urine culture showed with Enterococcus faecalis. Blood cultures  were negative.  Patient initially received IV cefepime and IV Flagyl 11/4-11/6.  This was changed to Unasyn on 11/6.    Patient will be given amoxicillin on discharge to complete the antibiotic course.  Hepatocellular carcinoma/liver cirrhosis: Patient follows up with oncology Dr Learta Codding as outpatient.  Seen by oncology during this hospitalization.    Right-sided abdominal, chest and neck pain-suspect this is related to [facility] and possible diaphragmatic irritation.   Patient also continues to have hiccups and has been on different pain medication regimen as outpatient.  Patient was started on chlorpromazine and baclofen during hospitalization.  This will be continued on discharge.   Dyspnea: Likely secondary to elevated diaphragm/diaphragmatic irritation.  CT angiogram of the chest without any pulmonary embolism or pneumonia.  Elevated D-dimer: Nonspecific. CT angiogram of chest negative for PE.  Ultrasound of the lower extremity without any DVT.  Fall at home:Reportedly fell out of bed and landed on carpeted floor. No head impact or LOC.  Seen  Physician Discharge Summary  [name] [id number]:[id number] DOB: [birth year] DOA: 01/07/2020  PCP: Hoyt Koch, MD  Admit date: 01/07/2020 Discharge date: 01/12/2020  Admitted From: Home  Discharge disposition: Home  Recommendations for Outpatient Follow-Up:   . Follow up with your primary care provider in one week.  . Check CBC, BMP, magnesium in the next visit . Patient has been prescribed Thorazine and baclofen for hiccups.  Please adjust medications as necessary as outpatient. . Follow-up with oncology for treatment of hepatocellular cancer.  Discharge Diagnosis:   Principal Problem:   Abdominal pain Active Problems:   Essential hypertension, benign   GERD (gastroesophageal reflux disease)   Hepatic cirrhosis (HCC)   Cancer, hepatocellular ([place])   Discharge Condition: Improved.  Diet recommendation:   Regular.  Wound care: None.  Code status: Full.   History of Present Illness:   69 year old male with history of hepatocellular cancer status post chemotherapy, currently on  lenvatinib followed by Dr. Benay Spice oncology as outpatient, alcoholism, hep C status post Harvoni, IDDM-2, PAD, COPD, HTN, a presented to the hospital brought in by EMS after falling from his bed and feeling sick.  He had been complaining of nausea and right-sided abdominal pain but there was no history of impact after the fall.  In the ED, patient was noted to have fever of 102 F with tachycardia and mild leukocytosis.  He had elevated lactate on presentation with hyponatremia and creatinine was elevated at 1.4.  Liver function tests were elevated and INR was 1.3.  Respiratory viral panel was negative.  Urinalysis was negative chest x-ray showed no acute findings.  CT scan of the abdomen and pelvis showed features concerning for cirrhosis of liver and hepatocellular cancer.  Patient was started on cefepime Flagyl and was admitted to the hospital.    Subsequently,Urine culture showed 7000  colonies of Enterococcus faecalis and antibiotics were changed to Unasyn 11/06.  Patient also had respiratory distress with tachypnea, tachycardia.  Chest x-ray did not show any acute finding and CT angiogram of the chest was negative for pulmonary embolus but lytic lesion in the right second rib was identified..  Lower extremity duplex ultrasound was negative for DVT.   Hospital Course:   Following conditions were addressed during hospitalization as listed below,  Severe sepsis due to Enterococcus UTI.  Present on admission.  Patient met sepsis criteria on presentation with fever, tachycardia and tachypnea and lactic acidosis, AKI and elevated liver enzymes. Urine culture showed with Enterococcus faecalis. Blood cultures  were negative.  Patient initially received IV cefepime and IV Flagyl 11/4-11/6.  This was changed to Unasyn on 11/6.    Patient will be given amoxicillin on discharge to complete the antibiotic course.  Hepatocellular carcinoma/liver cirrhosis: Patient follows up with oncology Dr Learta Codding as outpatient.  Seen by oncology during this hospitalization.    Right-sided abdominal, chest and neck pain-suspect this is related to [facility] and possible diaphragmatic irritation.   Patient also continues to have hiccups and has been on different pain medication regimen as outpatient.  Patient was started on chlorpromazine and baclofen during hospitalization.  This will be continued on discharge.   Dyspnea: Likely secondary to elevated diaphragm/diaphragmatic irritation.  CT angiogram of the chest without any pulmonary embolism or pneumonia.  Elevated D-dimer: Nonspecific. CT angiogram of chest negative for PE.  Ultrasound of the lower extremity without any DVT.  Fall at home:Reportedly fell out of bed and landed on carpeted floor. No head impact or LOC.  Seen  Physician Discharge Summary  [name] [id number]:[id number] DOB: [birth year] DOA: 01/07/2020  PCP: Hoyt Koch, MD  Admit date: 01/07/2020 Discharge date: 01/12/2020  Admitted From: Home  Discharge disposition: Home  Recommendations for Outpatient Follow-Up:   . Follow up with your primary care provider in one week.  . Check CBC, BMP, magnesium in the next visit . Patient has been prescribed Thorazine and baclofen for hiccups.  Please adjust medications as necessary as outpatient. . Follow-up with oncology for treatment of hepatocellular cancer.  Discharge Diagnosis:   Principal Problem:   Abdominal pain Active Problems:   Essential hypertension, benign   GERD (gastroesophageal reflux disease)   Hepatic cirrhosis (HCC)   Cancer, hepatocellular ([place])   Discharge Condition: Improved.  Diet recommendation:   Regular.  Wound care: None.  Code status: Full.   History of Present Illness:   69 year old male with history of hepatocellular cancer status post chemotherapy, currently on  lenvatinib followed by Dr. Benay Spice oncology as outpatient, alcoholism, hep C status post Harvoni, IDDM-2, PAD, COPD, HTN, a presented to the hospital brought in by EMS after falling from his bed and feeling sick.  He had been complaining of nausea and right-sided abdominal pain but there was no history of impact after the fall.  In the ED, patient was noted to have fever of 102 F with tachycardia and mild leukocytosis.  He had elevated lactate on presentation with hyponatremia and creatinine was elevated at 1.4.  Liver function tests were elevated and INR was 1.3.  Respiratory viral panel was negative.  Urinalysis was negative chest x-ray showed no acute findings.  CT scan of the abdomen and pelvis showed features concerning for cirrhosis of liver and hepatocellular cancer.  Patient was started on cefepime Flagyl and was admitted to the hospital.    Subsequently,Urine culture showed 7000  colonies of Enterococcus faecalis and antibiotics were changed to Unasyn 11/06.  Patient also had respiratory distress with tachypnea, tachycardia.  Chest x-ray did not show any acute finding and CT angiogram of the chest was negative for pulmonary embolus but lytic lesion in the right second rib was identified..  Lower extremity duplex ultrasound was negative for DVT.   Hospital Course:   Following conditions were addressed during hospitalization as listed below,  Severe sepsis due to Enterococcus UTI.  Present on admission.  Patient met sepsis criteria on presentation with fever, tachycardia and tachypnea and lactic acidosis, AKI and elevated liver enzymes. Urine culture showed with Enterococcus faecalis. Blood cultures  were negative.  Patient initially received IV cefepime and IV Flagyl 11/4-11/6.  This was changed to Unasyn on 11/6.    Patient will be given amoxicillin on discharge to complete the antibiotic course.  Hepatocellular carcinoma/liver cirrhosis: Patient follows up with oncology Dr Learta Codding as outpatient.  Seen by oncology during this hospitalization.    Right-sided abdominal, chest and neck pain-suspect this is related to [facility] and possible diaphragmatic irritation.   Patient also continues to have hiccups and has been on different pain medication regimen as outpatient.  Patient was started on chlorpromazine and baclofen during hospitalization.  This will be continued on discharge.   Dyspnea: Likely secondary to elevated diaphragm/diaphragmatic irritation.  CT angiogram of the chest without any pulmonary embolism or pneumonia.  Elevated D-dimer: Nonspecific. CT angiogram of chest negative for PE.  Ultrasound of the lower extremity without any DVT.  Fall at home:Reportedly fell out of bed and landed on carpeted floor. No head impact or LOC.  Seen  Physician Discharge Summary  [name] [id number]:[id number] DOB: [birth year] DOA: 01/07/2020  PCP: Hoyt Koch, MD  Admit date: 01/07/2020 Discharge date: 01/12/2020  Admitted From: Home  Discharge disposition: Home  Recommendations for Outpatient Follow-Up:   . Follow up with your primary care provider in one week.  . Check CBC, BMP, magnesium in the next visit . Patient has been prescribed Thorazine and baclofen for hiccups.  Please adjust medications as necessary as outpatient. . Follow-up with oncology for treatment of hepatocellular cancer.  Discharge Diagnosis:   Principal Problem:   Abdominal pain Active Problems:   Essential hypertension, benign   GERD (gastroesophageal reflux disease)   Hepatic cirrhosis (HCC)   Cancer, hepatocellular ([place])   Discharge Condition: Improved.  Diet recommendation:   Regular.  Wound care: None.  Code status: Full.   History of Present Illness:   69 year old male with history of hepatocellular cancer status post chemotherapy, currently on  lenvatinib followed by Dr. Benay Spice oncology as outpatient, alcoholism, hep C status post Harvoni, IDDM-2, PAD, COPD, HTN, a presented to the hospital brought in by EMS after falling from his bed and feeling sick.  He had been complaining of nausea and right-sided abdominal pain but there was no history of impact after the fall.  In the ED, patient was noted to have fever of 102 F with tachycardia and mild leukocytosis.  He had elevated lactate on presentation with hyponatremia and creatinine was elevated at 1.4.  Liver function tests were elevated and INR was 1.3.  Respiratory viral panel was negative.  Urinalysis was negative chest x-ray showed no acute findings.  CT scan of the abdomen and pelvis showed features concerning for cirrhosis of liver and hepatocellular cancer.  Patient was started on cefepime Flagyl and was admitted to the hospital.    Subsequently,Urine culture showed 7000  colonies of Enterococcus faecalis and antibiotics were changed to Unasyn 11/06.  Patient also had respiratory distress with tachypnea, tachycardia.  Chest x-ray did not show any acute finding and CT angiogram of the chest was negative for pulmonary embolus but lytic lesion in the right second rib was identified..  Lower extremity duplex ultrasound was negative for DVT.   Hospital Course:   Following conditions were addressed during hospitalization as listed below,  Severe sepsis due to Enterococcus UTI.  Present on admission.  Patient met sepsis criteria on presentation with fever, tachycardia and tachypnea and lactic acidosis, AKI and elevated liver enzymes. Urine culture showed with Enterococcus faecalis. Blood cultures  were negative.  Patient initially received IV cefepime and IV Flagyl 11/4-11/6.  This was changed to Unasyn on 11/6.    Patient will be given amoxicillin on discharge to complete the antibiotic course.  Hepatocellular carcinoma/liver cirrhosis: Patient follows up with oncology Dr Learta Codding as outpatient.  Seen by oncology during this hospitalization.    Right-sided abdominal, chest and neck pain-suspect this is related to [facility] and possible diaphragmatic irritation.   Patient also continues to have hiccups and has been on different pain medication regimen as outpatient.  Patient was started on chlorpromazine and baclofen during hospitalization.  This will be continued on discharge.   Dyspnea: Likely secondary to elevated diaphragm/diaphragmatic irritation.  CT angiogram of the chest without any pulmonary embolism or pneumonia.  Elevated D-dimer: Nonspecific. CT angiogram of chest negative for PE.  Ultrasound of the lower extremity without any DVT.  Fall at home:Reportedly fell out of bed and landed on carpeted floor. No head impact or LOC.  Seen  Physician Discharge Summary  [name] [id number]:[id number] DOB: [birth year] DOA: 01/07/2020  PCP: Hoyt Koch, MD  Admit date: 01/07/2020 Discharge date: 01/12/2020  Admitted From: Home  Discharge disposition: Home  Recommendations for Outpatient Follow-Up:   . Follow up with your primary care provider in one week.  . Check CBC, BMP, magnesium in the next visit . Patient has been prescribed Thorazine and baclofen for hiccups.  Please adjust medications as necessary as outpatient. . Follow-up with oncology for treatment of hepatocellular cancer.  Discharge Diagnosis:   Principal Problem:   Abdominal pain Active Problems:   Essential hypertension, benign   GERD (gastroesophageal reflux disease)   Hepatic cirrhosis (HCC)   Cancer, hepatocellular ([place])   Discharge Condition: Improved.  Diet recommendation:   Regular.  Wound care: None.  Code status: Full.   History of Present Illness:   69 year old male with history of hepatocellular cancer status post chemotherapy, currently on  lenvatinib followed by Dr. Benay Spice oncology as outpatient, alcoholism, hep C status post Harvoni, IDDM-2, PAD, COPD, HTN, a presented to the hospital brought in by EMS after falling from his bed and feeling sick.  He had been complaining of nausea and right-sided abdominal pain but there was no history of impact after the fall.  In the ED, patient was noted to have fever of 102 F with tachycardia and mild leukocytosis.  He had elevated lactate on presentation with hyponatremia and creatinine was elevated at 1.4.  Liver function tests were elevated and INR was 1.3.  Respiratory viral panel was negative.  Urinalysis was negative chest x-ray showed no acute findings.  CT scan of the abdomen and pelvis showed features concerning for cirrhosis of liver and hepatocellular cancer.  Patient was started on cefepime Flagyl and was admitted to the hospital.    Subsequently,Urine culture showed 7000  colonies of Enterococcus faecalis and antibiotics were changed to Unasyn 11/06.  Patient also had respiratory distress with tachypnea, tachycardia.  Chest x-ray did not show any acute finding and CT angiogram of the chest was negative for pulmonary embolus but lytic lesion in the right second rib was identified..  Lower extremity duplex ultrasound was negative for DVT.   Hospital Course:   Following conditions were addressed during hospitalization as listed below,  Severe sepsis due to Enterococcus UTI.  Present on admission.  Patient met sepsis criteria on presentation with fever, tachycardia and tachypnea and lactic acidosis, AKI and elevated liver enzymes. Urine culture showed with Enterococcus faecalis. Blood cultures  were negative.  Patient initially received IV cefepime and IV Flagyl 11/4-11/6.  This was changed to Unasyn on 11/6.    Patient will be given amoxicillin on discharge to complete the antibiotic course.  Hepatocellular carcinoma/liver cirrhosis: Patient follows up with oncology Dr Learta Codding as outpatient.  Seen by oncology during this hospitalization.    Right-sided abdominal, chest and neck pain-suspect this is related to [facility] and possible diaphragmatic irritation.   Patient also continues to have hiccups and has been on different pain medication regimen as outpatient.  Patient was started on chlorpromazine and baclofen during hospitalization.  This will be continued on discharge.   Dyspnea: Likely secondary to elevated diaphragm/diaphragmatic irritation.  CT angiogram of the chest without any pulmonary embolism or pneumonia.  Elevated D-dimer: Nonspecific. CT angiogram of chest negative for PE.  Ultrasound of the lower extremity without any DVT.  Fall at home:Reportedly fell out of bed and landed on carpeted floor. No head impact or LOC.  Seen  Physician Discharge Summary  [name] [id number]:[id number] DOB: [birth year] DOA: 01/07/2020  PCP: Hoyt Koch, MD  Admit date: 01/07/2020 Discharge date: 01/12/2020  Admitted From: Home  Discharge disposition: Home  Recommendations for Outpatient Follow-Up:   . Follow up with your primary care provider in one week.  . Check CBC, BMP, magnesium in the next visit . Patient has been prescribed Thorazine and baclofen for hiccups.  Please adjust medications as necessary as outpatient. . Follow-up with oncology for treatment of hepatocellular cancer.  Discharge Diagnosis:   Principal Problem:   Abdominal pain Active Problems:   Essential hypertension, benign   GERD (gastroesophageal reflux disease)   Hepatic cirrhosis (HCC)   Cancer, hepatocellular ([place])   Discharge Condition: Improved.  Diet recommendation:   Regular.  Wound care: None.  Code status: Full.   History of Present Illness:   69 year old male with history of hepatocellular cancer status post chemotherapy, currently on  lenvatinib followed by Dr. Benay Spice oncology as outpatient, alcoholism, hep C status post Harvoni, IDDM-2, PAD, COPD, HTN, a presented to the hospital brought in by EMS after falling from his bed and feeling sick.  He had been complaining of nausea and right-sided abdominal pain but there was no history of impact after the fall.  In the ED, patient was noted to have fever of 102 F with tachycardia and mild leukocytosis.  He had elevated lactate on presentation with hyponatremia and creatinine was elevated at 1.4.  Liver function tests were elevated and INR was 1.3.  Respiratory viral panel was negative.  Urinalysis was negative chest x-ray showed no acute findings.  CT scan of the abdomen and pelvis showed features concerning for cirrhosis of liver and hepatocellular cancer.  Patient was started on cefepime Flagyl and was admitted to the hospital.    Subsequently,Urine culture showed 7000  colonies of Enterococcus faecalis and antibiotics were changed to Unasyn 11/06.  Patient also had respiratory distress with tachypnea, tachycardia.  Chest x-ray did not show any acute finding and CT angiogram of the chest was negative for pulmonary embolus but lytic lesion in the right second rib was identified..  Lower extremity duplex ultrasound was negative for DVT.   Hospital Course:   Following conditions were addressed during hospitalization as listed below,  Severe sepsis due to Enterococcus UTI.  Present on admission.  Patient met sepsis criteria on presentation with fever, tachycardia and tachypnea and lactic acidosis, AKI and elevated liver enzymes. Urine culture showed with Enterococcus faecalis. Blood cultures  were negative.  Patient initially received IV cefepime and IV Flagyl 11/4-11/6.  This was changed to Unasyn on 11/6.    Patient will be given amoxicillin on discharge to complete the antibiotic course.  Hepatocellular carcinoma/liver cirrhosis: Patient follows up with oncology Dr Learta Codding as outpatient.  Seen by oncology during this hospitalization.    Right-sided abdominal, chest and neck pain-suspect this is related to [facility] and possible diaphragmatic irritation.   Patient also continues to have hiccups and has been on different pain medication regimen as outpatient.  Patient was started on chlorpromazine and baclofen during hospitalization.  This will be continued on discharge.   Dyspnea: Likely secondary to elevated diaphragm/diaphragmatic irritation.  CT angiogram of the chest without any pulmonary embolism or pneumonia.  Elevated D-dimer: Nonspecific. CT angiogram of chest negative for PE.  Ultrasound of the lower extremity without any DVT.  Fall at home:Reportedly fell out of bed and landed on carpeted floor. No head impact or LOC.  Seen  Physician Discharge Summary  [name] [id number]:[id number] DOB: [birth year] DOA: 01/07/2020  PCP: Hoyt Koch, MD  Admit date: 01/07/2020 Discharge date: 01/12/2020  Admitted From: Home  Discharge disposition: Home  Recommendations for Outpatient Follow-Up:   . Follow up with your primary care provider in one week.  . Check CBC, BMP, magnesium in the next visit . Patient has been prescribed Thorazine and baclofen for hiccups.  Please adjust medications as necessary as outpatient. . Follow-up with oncology for treatment of hepatocellular cancer.  Discharge Diagnosis:   Principal Problem:   Abdominal pain Active Problems:   Essential hypertension, benign   GERD (gastroesophageal reflux disease)   Hepatic cirrhosis (HCC)   Cancer, hepatocellular ([place])   Discharge Condition: Improved.  Diet recommendation:   Regular.  Wound care: None.  Code status: Full.   History of Present Illness:   69 year old male with history of hepatocellular cancer status post chemotherapy, currently on  lenvatinib followed by Dr. Benay Spice oncology as outpatient, alcoholism, hep C status post Harvoni, IDDM-2, PAD, COPD, HTN, a presented to the hospital brought in by EMS after falling from his bed and feeling sick.  He had been complaining of nausea and right-sided abdominal pain but there was no history of impact after the fall.  In the ED, patient was noted to have fever of 102 F with tachycardia and mild leukocytosis.  He had elevated lactate on presentation with hyponatremia and creatinine was elevated at 1.4.  Liver function tests were elevated and INR was 1.3.  Respiratory viral panel was negative.  Urinalysis was negative chest x-ray showed no acute findings.  CT scan of the abdomen and pelvis showed features concerning for cirrhosis of liver and hepatocellular cancer.  Patient was started on cefepime Flagyl and was admitted to the hospital.    Subsequently,Urine culture showed 7000  colonies of Enterococcus faecalis and antibiotics were changed to Unasyn 11/06.  Patient also had respiratory distress with tachypnea, tachycardia.  Chest x-ray did not show any acute finding and CT angiogram of the chest was negative for pulmonary embolus but lytic lesion in the right second rib was identified..  Lower extremity duplex ultrasound was negative for DVT.   Hospital Course:   Following conditions were addressed during hospitalization as listed below,  Severe sepsis due to Enterococcus UTI.  Present on admission.  Patient met sepsis criteria on presentation with fever, tachycardia and tachypnea and lactic acidosis, AKI and elevated liver enzymes. Urine culture showed with Enterococcus faecalis. Blood cultures  were negative.  Patient initially received IV cefepime and IV Flagyl 11/4-11/6.  This was changed to Unasyn on 11/6.    Patient will be given amoxicillin on discharge to complete the antibiotic course.  Hepatocellular carcinoma/liver cirrhosis: Patient follows up with oncology Dr Learta Codding as outpatient.  Seen by oncology during this hospitalization.    Right-sided abdominal, chest and neck pain-suspect this is related to [facility] and possible diaphragmatic irritation.   Patient also continues to have hiccups and has been on different pain medication regimen as outpatient.  Patient was started on chlorpromazine and baclofen during hospitalization.  This will be continued on discharge.   Dyspnea: Likely secondary to elevated diaphragm/diaphragmatic irritation.  CT angiogram of the chest without any pulmonary embolism or pneumonia.  Elevated D-dimer: Nonspecific. CT angiogram of chest negative for PE.  Ultrasound of the lower extremity without any DVT.  Fall at home:Reportedly fell out of bed and landed on carpeted floor. No head impact or LOC.  Seen

## 2020-01-13 ENCOUNTER — Telehealth: Payer: Self-pay

## 2020-01-13 ENCOUNTER — Other Ambulatory Visit: Payer: Self-pay

## 2020-01-13 NOTE — Patient Outreach (Signed)
[facility]) Care Management  01/13/2020  [name] [birth year] [id number]   Telephone call to patient for disease management follow up.   No answer.  HIPAA compliant voice message left.    Plan: If no return call, RN CM will attempt patient again within the next 4 business days and send letter.  [name], RN, MSN [place] Management Care Management Coordinator Direct Line [phone] Cell [phone] Toll Free: [phone]  Fax: [phone]

## 2020-01-13 NOTE — Telephone Encounter (Signed)
Patient calling back, if you could please return his call at [phone]

## 2020-01-13 NOTE — Telephone Encounter (Signed)
Transition Care Management Unsuccessful Follow-up Telephone Call  Date of discharge and from where:  01/12/2020 from [facility]  Attempts:  2nd Attempt (Patient called and I was on another call; returned call and left v-msg).  Reason for unsuccessful TCM follow-up call:  Left voice message

## 2020-01-13 NOTE — Telephone Encounter (Signed)
    Please return call to patient Appointment scheduled 11/19

## 2020-01-13 NOTE — Telephone Encounter (Signed)
Transition Care Management Unsuccessful Follow-up Telephone Call  Date of discharge and from where:  01/12/2020 from [facility]  Attempts:  1st Attempt  Reason for unsuccessful TCM follow-up call:  Left voice message

## 2020-01-14 ENCOUNTER — Telehealth: Payer: Self-pay

## 2020-01-14 NOTE — Telephone Encounter (Signed)
Transition Care Management Follow-up Telephone Call  Date of discharge and from where: 01/12/2020 at [facility]  How have you been since you were released from the hospital? Feeling better today  Any questions or concerns? No  Items Reviewed:  Did the pt receive and understand the discharge instructions provided? Yes   Medications obtained and verified? Yes   Other? Yes   Any new allergies since your discharge? No   Dietary orders reviewed? No, specific diet  Do you have support at home? Yes  daughter  Home Care and Equipment/Supplies: Were home health services ordered? no If so, what is the name of the agency? n/a  Has the agency set up a time to come to the patient's home? not applicable Were any new equipment or medical supplies ordered?  No What is the name of the medical supply agency? n/a Were you able to get the supplies/equipment? not applicable Do you have any questions related to the use of the equipment or supplies? No  Functional Questionnaire: (I = Independent and D = Dependent) ADLs: I  Bathing/Dressing- I  Meal Prep- I  Eating- I  Maintaining continence- I  Transferring/Ambulation- I  Managing Meds- I  Follow up appointments reviewed:   PCP Hospital f/u appt confirmed? Yes  Scheduled to see Pricilla Holm on 01/22/2020 @ 2:20pm.  [facility] f/u appt confirmed? Yes  Scheduled to see Ned Card, NP on 01/20/2020 @ 9:15 am.  Are transportation arrangements needed? No   If their condition worsens, is the pt aware to call PCP or go to the Emergency Dept.? Yes  Was the patient provided with contact information for the PCP's office or ED? Yes  Was to pt encouraged to call back with questions or concerns? Yes

## 2020-01-15 ENCOUNTER — Other Ambulatory Visit: Payer: Self-pay

## 2020-01-15 NOTE — Patient Outreach (Signed)
[facility]) Care Management  01/15/2020  [name] 01/30/1951 [id number]   Telephone call to patient for follow up.   No answer.  HIPAA compliant voice message left.    Plan: If no return call, RN CM will attempt patient again within 4 business days.  [name], RN, MSN [place] Management Care Management Coordinator Direct Line [phone] Cell [phone] Toll Free: [phone]  Fax: [phone]

## 2020-01-18 ENCOUNTER — Ambulatory Visit: Payer: Self-pay

## 2020-01-18 ENCOUNTER — Telehealth: Payer: Self-pay | Admitting: Endocrinology

## 2020-01-18 ENCOUNTER — Other Ambulatory Visit: Payer: Self-pay

## 2020-01-18 MED FILL — LENVIMA 4 MG DAILY DOSE 4 M: 4 | 30 days supply | Qty: 30 | Fill #0

## 2020-01-18 NOTE — Telephone Encounter (Signed)
Patient requests to be called at ph# [phone] re: Patient has been having low blood sugars (at 8:04 a.m. this morning = 47). Patient requests to be called to be advised on how much insulin he should be taking.

## 2020-01-18 NOTE — Telephone Encounter (Signed)
Please reduce the insulin to 140 units each morning.  However, if you are going to be active, take just 70 units.  I'll see you next week.

## 2020-01-18 NOTE — Telephone Encounter (Signed)
Spoken to patient and notified Dr Ellison's comments. Verbalized understanding.   

## 2020-01-18 NOTE — Patient Outreach (Signed)
[facility]) Care Management  [place]  01/18/2020   [name] 1951/01/28 [id number]  Subjective: Telephone call to patient post hospitalization. Patient reports he is getting better daily. He acknowledges some weakness.  He states his daughter is with him now helping him.  He reports he has follow ups and is waiting to see when he starts his cancer treatments back.  Discussed signs of infection and when to seek treatment.  Objective:   Encounter Medications:  Outpatient Encounter Medications as of 01/18/2020  Medication Sig  . Accu-Chek FastClix Lancets MISC 1 each by Does not apply route 4 (four) times daily. E11.9  . albuterol (VENTOLIN HFA) 108 (90 Base) MCG/ACT inhaler Inhale 1-2 puffs into the lungs every 4 (four) hours as needed for wheezing or shortness of breath.  [name] aspirin EC 81 MG tablet Take 1 tablet (81 mg total) by mouth daily.  [name] atorvastatin (LIPITOR) 40 MG tablet TAKE 1 TABLET EVERY DAY  . betamethasone valerate ointment (VALISONE) 0.1 % APPLY ONE APPLICATION TOPICALLY TWO TIMES DAILY  . Blood Glucose Calibration (ACCU-CHEK GUIDE CONTROL) LIQD 1 each by Other route as needed (Use to calibrate meter prn). E11.9  . Blood Glucose Monitoring Suppl (ACCU-CHEK GUIDE ME) w/Device KIT 1 kit by Does not apply route See admin instructions. Check blood sugar 4 times a day  . cholecalciferol (VITAMIN D) 1000 UNITS tablet Take 1 tablet (1,000 Units total) by mouth daily.  . Cinnamon 500 MG capsule Take 500 mg by mouth 2 (two) times daily.   . Fluticasone-Salmeterol (ADVAIR) 100-50 MCG/DOSE AEPB Inhale 1 puff into the lungs 2 (two) times daily.  [name] glucose blood (ACCU-CHEK GUIDE) test strip 1 each by Other route 4 (four) times daily.  [name] HOMEOPATHIC PRODUCTS PO Take by mouth daily. "Hyland's Leg Cramps"   . HYDROcodone-acetaminophen (NORCO/VICODIN) 5-325 MG tablet Take 1 tablet by mouth every 4 (four) hours as needed for moderate pain.  . hydrOXYzine  (ATARAX/VISTARIL) 25 MG tablet Take 1 tablet by mouth three times daily as needed (Patient taking differently: Take 25 mg by mouth every 8 (eight) hours as needed for anxiety or itching. )  . insulin NPH-regular Human (NOVOLIN 70/30) (70-30) 100 UNIT/ML injection Inject 160 Units into the skin daily with breakfast.  . Insulin Syringe-Needle U-100 (BD VEO INSULIN SYRINGE U/F) 31G X 15/64" 1 ML MISC Use for injecting insulin once a day.  . latanoprost (XALATAN) 0.005 % ophthalmic solution Place 1 drop into both eyes daily.   [name] lisinopril-hydrochlorothiazide (ZESTORETIC) 20-25 MG tablet Take 1 tablet by mouth daily. APPOINTMENT NEEDED FOR ADDITIONAL REFILLS  . magic mouthwash SOLN Take 5-10 mLs by mouth 4 (four) times daily as needed for mouth pain (Swish and swallow).  . Multiple Vitamins-Minerals (MULTIVITAMIN WITH MINERALS) tablet Take 1 tablet by mouth daily.   . Omega-3 Fatty Acids (FISH OIL) 1000 MG CAPS Take 2 capsules by mouth daily.   . pantoprazole (PROTONIX) 40 MG tablet TAKE 1 TABLET BY MOUTH ONCE DAILY BEFORE  BREAKFAST. (Patient taking differently: Take 40 mg by mouth daily as needed (heartburn). )  . polyethylene glycol (MIRALAX / GLYCOLAX) 17 g packet Take 17 g by mouth daily.  . traZODone (DESYREL) 50 MG tablet TAKE 1 TABLET AT BEDTIME AS NEEDED FOR SLEEP (Patient taking differently: Take 50 mg by mouth at bedtime. )  . Alcohol Swabs (B-D SINGLE USE SWABS REGULAR) PADS Use to swab finger 4 times daily.  . baclofen 5 MG TABS Take 5  mg by mouth 3 (three) times daily as needed (hiccups). (Patient not taking: Reported on 01/18/2020)  . chlorproMAZINE (THORAZINE) 25 MG tablet Take 1 tablet (25 mg total) by mouth 4 (four) times daily as needed for hiccoughs (not relieved with baclofen). (Patient not taking: Reported on 01/18/2020)  . famotidine (PEPCID) 40 MG tablet Take 40 mg by mouth daily. (Patient not taking: Reported on 01/18/2020)  . lenvatinib 4 mg daily dose (LENVIMA) capsule Take 1  capsule (4 mg total) by mouth daily. (Patient not taking: Reported on 01/18/2020)  . methocarbamol (ROBAXIN) 500 MG tablet Take 1 tablet (500 mg total) by mouth 2 (two) times daily. (Patient not taking: Reported on 01/07/2020)  . sildenafil (VIAGRA) 100 MG tablet Take 0.5-1 tablets (50-100 mg total) by mouth daily as needed for erectile dysfunction. (Patient not taking: Reported on 01/07/2020)   No facility-administered encounter medications on file as of 01/18/2020.    Functional Status:  In your present state of health, do you have any difficulty performing the following activities: 01/18/2020 01/07/2020  Hearing? N N  Vision? N N  Difficulty concentrating or making decisions? N N  Walking or climbing stairs? Y Y  Comment weakness gets short of breath  Dressing or bathing? N N  Doing errands, shopping? N N  Preparing Food and eating ? N -  Using the Toilet? N -  In the past six months, have you accidently leaked urine? N -  Do you have problems with loss of bowel control? N -  Managing your Medications? N -  Managing your Finances? N -  Housekeeping or managing your Housekeeping? N -  Some recent data might be hidden    Fall/Depression Screening: Fall Risk  01/18/2020 10/26/2019 08/24/2019  Falls in the past year? 1 0 0  Number falls in past yr: 0 - -  Injury with Fall? 1 - -  Risk for fall due to : History of fall(s) - -  Follow up - - -   PHQ 2/9 Scores 01/18/2020 10/26/2019 06/15/2019 09/15/2018 09/02/2018 08/29/2018 08/23/2017  PHQ - 2 Score 0 0 0 0 0 1 1  PHQ- 9 Score - - - - - - 2    Assessment:  Patient with recent hospital discharge.  Patient recovering with support of daughter.   Goals Addressed            This Visit's Progress   . Monitor and Manage My Blood Sugar   On track    Follow Up Date 03/03/21   - check blood sugar at prescribed times - check blood sugar if I feel it is too high or too low - enter blood sugar readings and medication or insulin into daily  log - take the blood sugar log to all doctor visits    Why is this important?   Checking your blood sugar at home helps to keep it from getting very high or very low.  Writing the results in a diary or log helps the doctor know how to care for you.  Your blood sugar log should have the time, date and the results.  Also, write down the amount of insulin or other medicine that you take.  Other information, like what you ate, exercise done and how you were feeling, will also be helpful.     Notes:     . THN-Follow My Treatment Plan       Follow Up Date 03/03/20  - keep a list of all the medicines I  take; vitamins and herbals too - keep follow-up appointments    Why is this important?   Following your treatment plan will help keep your care on track.  Medicine may be the most important piece of your plan.  There are many reasons why you might want to stop taking medicine. You may get tired of taking your medicine. You may think medicine costs too much money. You may find the side effects are too much to bear.  Try some of these steps to make following the treatment plan a little easier.     Notes:     . THN-Make and Keep All Appointments       Follow Up Date 03/03/20   - ask family or friend for a ride - keep a calendar with appointment dates    Why is this important?   Part of staying healthy is seeing the doctor for follow-up care.  If you forget your appointments, there are some things you can do to stay on track.    Notes:     . THN-Manage Fatigue (Tiredness)       Follow Up Date 03/03/20   - eat healthy - maintain healthy weight - use meditation or relaxation techniques    Why is this important?   Cancer treatment and its side effects can drain your energy. It can keep you from doing things you would like to do.  There are many things that you can do to manage fatigue.    Notes:        Plan: RN CM will send update to physician and welcome letter to patient. RN  CM will contact patient again in the month of November and patient agreeable.  [name], RN, MSN [place] Management Care Management Coordinator Direct Line [phone] Cell [phone] Toll Free: [phone]  Fax: [phone]

## 2020-01-18 NOTE — Telephone Encounter (Signed)
Spoken to patient and he stated that  BS reading  51 at 3:47 pm on 01/17/2020 58 at 4:43 pm on 01/17/2020 92 at 8:80 pm on 01/17/2020  This morning 8:04 am was 47

## 2020-01-20 ENCOUNTER — Encounter: Payer: Self-pay | Admitting: Nurse Practitioner

## 2020-01-20 ENCOUNTER — Ambulatory Visit (HOSPITAL_COMMUNITY)
Admission: RE | Admit: 2020-01-20 | Discharge: 2020-01-20 | Disposition: A | Discharge status: Home / Self Care | Payer: Medicare HMO | Source: Ambulatory Visit | Attending: Nurse Practitioner | Admitting: Nurse Practitioner

## 2020-01-20 ENCOUNTER — Inpatient Hospital Stay: Payer: Medicare HMO | Attending: Nurse Practitioner

## 2020-01-20 ENCOUNTER — Telehealth: Payer: Self-pay

## 2020-01-20 ENCOUNTER — Inpatient Hospital Stay (HOSPITAL_BASED_OUTPATIENT_CLINIC_OR_DEPARTMENT_OTHER): Payer: Medicare HMO | Admitting: Nurse Practitioner

## 2020-01-20 ENCOUNTER — Other Ambulatory Visit: Payer: Self-pay

## 2020-01-20 ENCOUNTER — Other Ambulatory Visit: Payer: Self-pay | Admitting: Nurse Practitioner

## 2020-01-20 VITALS — BP 128/76 | HR 100 | Temp 97.9°F | Resp 18 | Ht 64.0 in | Wt 186.3 lb

## 2020-01-20 DIAGNOSIS — R7989 Other specified abnormal findings of blood chemistry: Secondary | ICD-10-CM | POA: Diagnosis not present

## 2020-01-20 DIAGNOSIS — Z794 Long term (current) use of insulin: Secondary | ICD-10-CM | POA: Diagnosis not present

## 2020-01-20 DIAGNOSIS — M546 Pain in thoracic spine: Secondary | ICD-10-CM | POA: Diagnosis present

## 2020-01-20 DIAGNOSIS — K219 Gastro-esophageal reflux disease without esophagitis: Secondary | ICD-10-CM | POA: Diagnosis present

## 2020-01-20 DIAGNOSIS — K7200 Acute and subacute hepatic failure without coma: Secondary | ICD-10-CM | POA: Diagnosis not present

## 2020-01-20 DIAGNOSIS — E860 Dehydration: Secondary | ICD-10-CM | POA: Diagnosis present

## 2020-01-20 DIAGNOSIS — R7401 Elevation of levels of liver transaminase levels: Secondary | ICD-10-CM | POA: Insufficient documentation

## 2020-01-20 DIAGNOSIS — I2510 Atherosclerotic heart disease of native coronary artery without angina pectoris: Secondary | ICD-10-CM | POA: Insufficient documentation

## 2020-01-20 DIAGNOSIS — C220 Liver cell carcinoma: Secondary | ICD-10-CM | POA: Diagnosis not present

## 2020-01-20 DIAGNOSIS — I5032 Chronic diastolic (congestive) heart failure: Secondary | ICD-10-CM | POA: Diagnosis not present

## 2020-01-20 DIAGNOSIS — R188 Other ascites: Secondary | ICD-10-CM | POA: Insufficient documentation

## 2020-01-20 DIAGNOSIS — N179 Acute kidney failure, unspecified: Secondary | ICD-10-CM | POA: Diagnosis not present

## 2020-01-20 DIAGNOSIS — R109 Unspecified abdominal pain: Secondary | ICD-10-CM | POA: Diagnosis not present

## 2020-01-20 DIAGNOSIS — K7460 Unspecified cirrhosis of liver: Secondary | ICD-10-CM | POA: Insufficient documentation

## 2020-01-20 DIAGNOSIS — J9811 Atelectasis: Secondary | ICD-10-CM | POA: Diagnosis not present

## 2020-01-20 DIAGNOSIS — E119 Type 2 diabetes mellitus without complications: Secondary | ICD-10-CM | POA: Insufficient documentation

## 2020-01-20 DIAGNOSIS — R1011 Right upper quadrant pain: Secondary | ICD-10-CM | POA: Diagnosis not present

## 2020-01-20 DIAGNOSIS — K449 Diaphragmatic hernia without obstruction or gangrene: Secondary | ICD-10-CM | POA: Diagnosis not present

## 2020-01-20 DIAGNOSIS — J449 Chronic obstructive pulmonary disease, unspecified: Secondary | ICD-10-CM | POA: Insufficient documentation

## 2020-01-20 DIAGNOSIS — Z87891 Personal history of nicotine dependence: Secondary | ICD-10-CM | POA: Diagnosis not present

## 2020-01-20 DIAGNOSIS — I110 Hypertensive heart disease with heart failure: Secondary | ICD-10-CM | POA: Diagnosis present

## 2020-01-20 DIAGNOSIS — K625 Hemorrhage of anus and rectum: Secondary | ICD-10-CM | POA: Diagnosis not present

## 2020-01-20 DIAGNOSIS — R748 Abnormal levels of other serum enzymes: Secondary | ICD-10-CM | POA: Diagnosis not present

## 2020-01-20 DIAGNOSIS — Z9842 Cataract extraction status, left eye: Secondary | ICD-10-CM | POA: Diagnosis not present

## 2020-01-20 DIAGNOSIS — E871 Hypo-osmolality and hyponatremia: Secondary | ICD-10-CM | POA: Diagnosis not present

## 2020-01-20 DIAGNOSIS — E7800 Pure hypercholesterolemia, unspecified: Secondary | ICD-10-CM | POA: Diagnosis present

## 2020-01-20 DIAGNOSIS — B1920 Unspecified viral hepatitis C without hepatic coma: Secondary | ICD-10-CM | POA: Diagnosis present

## 2020-01-20 DIAGNOSIS — Z20822 Contact with and (suspected) exposure to covid-19: Secondary | ICD-10-CM | POA: Diagnosis not present

## 2020-01-20 DIAGNOSIS — Z888 Allergy status to other drugs, medicaments and biological substances status: Secondary | ICD-10-CM | POA: Diagnosis not present

## 2020-01-20 DIAGNOSIS — Z96652 Presence of left artificial knee joint: Secondary | ICD-10-CM | POA: Diagnosis present

## 2020-01-20 DIAGNOSIS — D171 Benign lipomatous neoplasm of skin and subcutaneous tissue of trunk: Secondary | ICD-10-CM | POA: Diagnosis not present

## 2020-01-20 DIAGNOSIS — L299 Pruritus, unspecified: Secondary | ICD-10-CM | POA: Diagnosis not present

## 2020-01-20 DIAGNOSIS — D1779 Benign lipomatous neoplasm of other sites: Secondary | ICD-10-CM | POA: Diagnosis not present

## 2020-01-20 DIAGNOSIS — Z823 Family history of stroke: Secondary | ICD-10-CM | POA: Diagnosis not present

## 2020-01-20 LAB — CBC WITH DIFFERENTIAL (CANCER CENTER ONLY)
Abs Immature Granulocytes: 0.1 10*3/uL — ABNORMAL HIGH (ref 0.00–0.07)
Basophils Absolute: 0.1 10*3/uL (ref 0.0–0.1)
Basophils Relative: 1 %
Eosinophils Absolute: 0.1 10*3/uL (ref 0.0–0.5)
Eosinophils Relative: 1 %
HCT: 39.4 % (ref 39.0–52.0)
Hemoglobin: 13 g/dL (ref 13.0–17.0)
Immature Granulocytes: 1 %
Lymphocytes Relative: 14 %
Lymphs Abs: 1.6 10*3/uL (ref 0.7–4.0)
MCH: 26.3 pg (ref 26.0–34.0)
MCHC: 33 g/dL (ref 30.0–36.0)
MCV: 79.8 fL — ABNORMAL LOW (ref 80.0–100.0)
Monocytes Absolute: 0.8 10*3/uL (ref 0.1–1.0)
Monocytes Relative: 7 %
Neutro Abs: 8.4 10*3/uL — ABNORMAL HIGH (ref 1.7–7.7)
Neutrophils Relative %: 76 %
Platelet Count: 807 10*3/uL — ABNORMAL HIGH (ref 150–400)
RBC: 4.94 MIL/uL (ref 4.22–5.81)
RDW: 18.6 % — ABNORMAL HIGH (ref 11.5–15.5)
WBC Count: 11 10*3/uL — ABNORMAL HIGH (ref 4.0–10.5)
nRBC: 0 % (ref 0.0–0.2)

## 2020-01-20 LAB — CMP (CANCER CENTER ONLY)
ALT: 478 U/L (ref 0–44)
AST: 772 U/L (ref 15–41)
Albumin: 2.3 g/dL — ABNORMAL LOW (ref 3.5–5.0)
Alkaline Phosphatase: 995 U/L — ABNORMAL HIGH (ref 38–126)
Anion gap: 11 (ref 5–15)
BUN: 16 mg/dL (ref 8–23)
CO2: 22 mmol/L (ref 22–32)
Calcium: 8.9 mg/dL (ref 8.9–10.3)
Chloride: 96 mmol/L — ABNORMAL LOW (ref 98–111)
Creatinine: 1.18 mg/dL (ref 0.61–1.24)
GFR, Estimated: >60 mL/min (ref >60)
Glucose, Bld: 85 mg/dL (ref 70–99)
Potassium: 4.1 mmol/L (ref 3.5–5.1)
Sodium: 129 mmol/L — ABNORMAL LOW (ref 135–145)
Total Bilirubin: 9.1 mg/dL (ref 0.3–1.2)
Total Protein: 7.8 g/dL (ref 6.5–8.1)

## 2020-01-20 MED ORDER — OXYCODONE HCL 5 MG PO TABS: 5.0000 mg | ORAL_TABLET | ORAL | 0 refills | Status: DC | PRN

## 2020-01-20 NOTE — Progress Notes (Signed)
Interventional Radiology Brief Note:  Patient presents to [facility] today for possible paracentesis.  Limited US Abdomen shows no fluid present today.  Patient complains of abdominal pain and reports he is working with medical team to determine the source of his pain.  Denies abdominal distention which is consistent with findings of no drainable ascites today.  Encouraged patient to continue to work with providers to determine the source of his discomfort.   No procedure performed. Patient discharged home.   Brynda Greathouse, MS RD PA-C

## 2020-01-20 NOTE — Progress Notes (Signed)
[place] OFFICE PROGRESS NOTE   Diagnosis: Hepatocellular carcinoma  INTERVAL HISTORY:   Mr. Etsitty returns for follow-up.  He reports right-sided abdominal pain not controlled with hydrocodone.  He is having difficulty sleeping because of the pain.  Bowels are moving.  No diarrhea.  No nausea or vomiting.  Poor oral intake.  No fever.  Objective:  Vital signs in last 24 hours:  Blood pressure 128/76, pulse 100, temperature 97.9 F (36.6 C), temperature source Tympanic, resp. rate 18, height 5\' 4"  (1.626 m), weight 186 lb 4.8 oz (84.5 kg), SpO2 99 %.    HEENT: Scleral icterus.  No thrush. Resp: Lungs clear bilaterally. Cardio: Regular rate and rhythm. GI: Abdomen distended, consistent with ascites. Vascular: No leg edema. Neuro: Alert and oriented.    Lab Results:  Lab Results  Component Value Date   WBC 10.8 (H) 01/11/2020   HGB 11.7 (L) 01/11/2020   HCT 34.2 (L) 01/11/2020   MCV 77.0 (L) 01/11/2020   PLT 469 (H) 01/11/2020   NEUTROABS 7.7 01/11/2020    Imaging:  No results found.  Medications: I have reviewed the patient's current medications.  Assessment/Plan: 1. Hepatocellular carcinoma  02/12/2019 AFP 10,200  02/18/2019 CT abdomen-dominant poorly marginated heterogeneously enhancing 8.6 x 7.7 cm posterior right liver lobe mass, new compared to CT study 09/10/2016. Numerous similar-appearing liver masses scattered throughout the liver all new since 09/10/2016 CT. Diffusely irregular liver surface compatible with cirrhosis. Spleen normal size. No ascites. No abdominal adenopathy.  MRI liver 03/05/2019-multifocal hepatic metastases including a 10 cm lesion in the right liver,Li-rads 5, no evidence of metastatic disease  Cycle 1 atezolizumab/bevacizumab 03/20/2019  Cycle 2 atezolizumab/bevacizumab 04/24/2019  Cycle 3 atezolizumab/bevacizumab 05/15/2019  Cycle 4 atezolizumab/bevacizumab 06/05/2019  MRI liver 06/24/2019-multifocal  hepatocellular carcinoma, lesions stable to decreased in size. No evidence for disease progression. Cirrhosis.  Cycle 5 atezolizumab/bevacizumab 06/25/2019  Cycle 6 atezolizumab/bevacizumab 07/17/2019  Cycle 7 atezolizumab/bevacizumab 08/07/2019  Cycle 8 atezolizumab/bevacizumab 08/28/2019  Cycle 9atezolizumab/bevacizumab 09/18/2019  Cycle 10 atezolizumab/bevacizumab 10/09/2019  MRI liver 10/27/2019-enlargement of multifocal liver lesions, cirrhosis  Lenvatinib beginning 11/10/2019, held starting 11/23/2019 secondary to diarrhea and mucositis  Lenvatinib resumed at 4 mg daily beginning 12/01/2019  CT chest 01/09/2020-small right pleural effusion with adjacent compressive atelectasis; lytic lesion involving the right posterior second rib at the costovertebral junction with extraosseous soft tissue component and bony expansion suspicious for metastatic disease.  Lucent lesion within the left lateral fifth rib also present exam 1 year ago and not significantly changed. 2. 01/20/2019 chest CT-no definite evidence of pulmonary embolus. Wall thickening of the distal esophagus. Nodular hepatic contours. 3. Cirrhosis  4. Hepatitis C status post Harvoni 5. Type 2 diabetes 6. COPD 7. CAD 8. Chronic diastolic heart failure 9. Hypertension 10. Positive Cologuard 2019 11. Diarrhea and mucositis 11/23/2019 12. Dehydration secondary to diarrhea 11/23/2019 13. Right arm/shoulder pain-plain x-ray right shoulder with slight irregularity inferior glenoid, no fracture or dislocation; right humerus with mild degenerative changes in the acromioclavicular joint, thinning humeral joint, no acute fracture or dislocation and no lytic or sclerotic lesion noted.Pain resolved 11/30/2019.CT 12/10/2019-suspected full-thickness supraspinatus tear with mild muscle atrophy. 3 x 4 x 9 mm oval lucent lesion in the posterior cortex of the proximal humeral metaphysis, nonspecific, unlikely to represent metastatic disease;  moderate acromioclavicular and mild glenohumeral osteoarthritis. [facility] admission 01/07/2020-sepsis   Disposition: Mr. Nobbe continues to have right-sided abdominal pain.  The pain is not controlled with hydrocodone.  We are sending a prescription to  his pharmacy for oxycodone.  We are referring him for a diagnostic/therapeutic paracentesis as he has significant ascites on exam.    He appears jaundice.  We will follow-up on the chemistry panel from today.  He will continue to hold lenvatinib.  We will see him back for reevaluation in 1 week.  We are available to see him sooner if needed.  Patient seen with Dr. Benay Spice.  Ned Card ANP/GNP-BC   01/20/2020  9:35 AM This was a shared visit with Ned Card.  Mr. Brumley was interviewed and examined.  The right abdominal pain is most likely related to the liver tumor burden.  We adjusted the narcotic regimen.  Lenvatinib will remain on hold.  He will be referred for a paracentesis.  Julieanne Manson, MD

## 2020-01-20 NOTE — Telephone Encounter (Signed)
CRITICAL VALUE STICKER  CRITICAL VALUE: Total Bili = 9.1, ALT = 478 and AST = 772  RECEIVER (on-site recipient of call): Yetta Glassman, CMA  DATE & TIME NOTIFIED: 01/20/20 at 10:04am  MESSENGER (representative from lab): Hillary  MD NOTIFIED: Dr. Ammie Dalton  TIME OF NOTIFICATION: 01/20/20 at 10:10am  RESPONSE: Critical value given to RN for follow-up with provider.

## 2020-01-21 ENCOUNTER — Other Ambulatory Visit: Payer: Self-pay | Admitting: Nurse Practitioner

## 2020-01-21 ENCOUNTER — Telehealth: Payer: Self-pay | Admitting: *Deleted

## 2020-01-21 DIAGNOSIS — C220 Liver cell carcinoma: Secondary | ICD-10-CM

## 2020-01-21 LAB — AFP TUMOR MARKER: AFP, Serum, Tumor Marker: 43011 ng/mL — ABNORMAL HIGH (ref 0.0–8.3)

## 2020-01-21 NOTE — Telephone Encounter (Signed)
Left message requesting to speak w/Dr. Benay Spice regarding his condition.

## 2020-01-22 ENCOUNTER — Inpatient Hospital Stay (HOSPITAL_COMMUNITY): Payer: Medicare HMO

## 2020-01-22 ENCOUNTER — Ambulatory Visit (HOSPITAL_BASED_OUTPATIENT_CLINIC_OR_DEPARTMENT_OTHER): Payer: Medicare HMO | Admitting: Medical

## 2020-01-22 ENCOUNTER — Inpatient Hospital Stay: Payer: Medicare HMO | Admitting: Internal Medicine

## 2020-01-22 ENCOUNTER — Telehealth: Payer: Self-pay

## 2020-01-22 ENCOUNTER — Encounter: Payer: Self-pay | Admitting: *Deleted

## 2020-01-22 ENCOUNTER — Other Ambulatory Visit: Payer: Self-pay | Admitting: Medical

## 2020-01-22 ENCOUNTER — Encounter (HOSPITAL_COMMUNITY): Payer: Self-pay | Admitting: Internal Medicine

## 2020-01-22 ENCOUNTER — Other Ambulatory Visit: Payer: Self-pay | Admitting: Pharmacist

## 2020-01-22 ENCOUNTER — Other Ambulatory Visit: Payer: Self-pay

## 2020-01-22 ENCOUNTER — Inpatient Hospital Stay: Payer: Medicare HMO

## 2020-01-22 ENCOUNTER — Encounter: Payer: Self-pay | Admitting: Nurse Practitioner

## 2020-01-22 ENCOUNTER — Inpatient Hospital Stay (HOSPITAL_COMMUNITY)
Admission: AD | Admit: 2020-01-22 | Discharge: 2020-01-26 | DRG: 436 | Disposition: A | Discharge status: Hospice | Payer: Medicare HMO | Attending: Internal Medicine | Admitting: Internal Medicine

## 2020-01-22 ENCOUNTER — Inpatient Hospital Stay (HOSPITAL_BASED_OUTPATIENT_CLINIC_OR_DEPARTMENT_OTHER): Payer: Medicare HMO | Admitting: Nurse Practitioner

## 2020-01-22 VITALS — BP 124/74 | HR 99 | Temp 99.1°F | Resp 16 | Ht 64.0 in | Wt 188.9 lb

## 2020-01-22 DIAGNOSIS — E119 Type 2 diabetes mellitus without complications: Secondary | ICD-10-CM | POA: Diagnosis present

## 2020-01-22 DIAGNOSIS — Z888 Allergy status to other drugs, medicaments and biological substances status: Secondary | ICD-10-CM | POA: Diagnosis not present

## 2020-01-22 DIAGNOSIS — E871 Hypo-osmolality and hyponatremia: Secondary | ICD-10-CM | POA: Diagnosis present

## 2020-01-22 DIAGNOSIS — Z20822 Contact with and (suspected) exposure to covid-19: Secondary | ICD-10-CM | POA: Diagnosis present

## 2020-01-22 DIAGNOSIS — D1779 Benign lipomatous neoplasm of other sites: Secondary | ICD-10-CM | POA: Diagnosis not present

## 2020-01-22 DIAGNOSIS — R933 Abnormal findings on diagnostic imaging of other parts of digestive tract: Secondary | ICD-10-CM

## 2020-01-22 DIAGNOSIS — Z9842 Cataract extraction status, left eye: Secondary | ICD-10-CM | POA: Diagnosis not present

## 2020-01-22 DIAGNOSIS — R109 Unspecified abdominal pain: Secondary | ICD-10-CM

## 2020-01-22 DIAGNOSIS — L299 Pruritus, unspecified: Secondary | ICD-10-CM | POA: Diagnosis present

## 2020-01-22 DIAGNOSIS — Z794 Long term (current) use of insulin: Secondary | ICD-10-CM

## 2020-01-22 DIAGNOSIS — J449 Chronic obstructive pulmonary disease, unspecified: Secondary | ICD-10-CM | POA: Diagnosis present

## 2020-01-22 DIAGNOSIS — J9811 Atelectasis: Secondary | ICD-10-CM | POA: Diagnosis not present

## 2020-01-22 DIAGNOSIS — I110 Hypertensive heart disease with heart failure: Secondary | ICD-10-CM | POA: Diagnosis present

## 2020-01-22 DIAGNOSIS — B1920 Unspecified viral hepatitis C without hepatic coma: Secondary | ICD-10-CM | POA: Diagnosis present

## 2020-01-22 DIAGNOSIS — R1011 Right upper quadrant pain: Secondary | ICD-10-CM | POA: Diagnosis present

## 2020-01-22 DIAGNOSIS — C220 Liver cell carcinoma: Secondary | ICD-10-CM

## 2020-01-22 DIAGNOSIS — N179 Acute kidney failure, unspecified: Secondary | ICD-10-CM | POA: Diagnosis present

## 2020-01-22 DIAGNOSIS — R748 Abnormal levels of other serum enzymes: Secondary | ICD-10-CM | POA: Diagnosis present

## 2020-01-22 DIAGNOSIS — I5032 Chronic diastolic (congestive) heart failure: Secondary | ICD-10-CM | POA: Diagnosis present

## 2020-01-22 DIAGNOSIS — Z83438 Family history of other disorder of lipoprotein metabolism and other lipidemia: Secondary | ICD-10-CM

## 2020-01-22 DIAGNOSIS — K7200 Acute and subacute hepatic failure without coma: Secondary | ICD-10-CM | POA: Diagnosis not present

## 2020-01-22 DIAGNOSIS — K219 Gastro-esophageal reflux disease without esophagitis: Secondary | ICD-10-CM | POA: Diagnosis present

## 2020-01-22 DIAGNOSIS — Z823 Family history of stroke: Secondary | ICD-10-CM

## 2020-01-22 DIAGNOSIS — E860 Dehydration: Secondary | ICD-10-CM | POA: Diagnosis present

## 2020-01-22 DIAGNOSIS — I2510 Atherosclerotic heart disease of native coronary artery without angina pectoris: Secondary | ICD-10-CM | POA: Diagnosis present

## 2020-01-22 DIAGNOSIS — K7460 Unspecified cirrhosis of liver: Secondary | ICD-10-CM | POA: Diagnosis present

## 2020-01-22 DIAGNOSIS — Z96652 Presence of left artificial knee joint: Secondary | ICD-10-CM | POA: Diagnosis present

## 2020-01-22 DIAGNOSIS — R7401 Elevation of levels of liver transaminase levels: Secondary | ICD-10-CM | POA: Diagnosis not present

## 2020-01-22 DIAGNOSIS — E7800 Pure hypercholesterolemia, unspecified: Secondary | ICD-10-CM | POA: Diagnosis present

## 2020-01-22 DIAGNOSIS — E785 Hyperlipidemia, unspecified: Secondary | ICD-10-CM | POA: Diagnosis present

## 2020-01-22 DIAGNOSIS — Z79899 Other long term (current) drug therapy: Secondary | ICD-10-CM

## 2020-01-22 DIAGNOSIS — K7290 Hepatic failure, unspecified without coma: Secondary | ICD-10-CM | POA: Diagnosis present

## 2020-01-22 DIAGNOSIS — R188 Other ascites: Secondary | ICD-10-CM | POA: Diagnosis not present

## 2020-01-22 DIAGNOSIS — D171 Benign lipomatous neoplasm of skin and subcutaneous tissue of trunk: Secondary | ICD-10-CM | POA: Diagnosis not present

## 2020-01-22 DIAGNOSIS — K625 Hemorrhage of anus and rectum: Secondary | ICD-10-CM | POA: Diagnosis not present

## 2020-01-22 DIAGNOSIS — R059 Cough, unspecified: Secondary | ICD-10-CM

## 2020-01-22 DIAGNOSIS — R7989 Other specified abnormal findings of blood chemistry: Secondary | ICD-10-CM | POA: Diagnosis present

## 2020-01-22 DIAGNOSIS — Z833 Family history of diabetes mellitus: Secondary | ICD-10-CM

## 2020-01-22 DIAGNOSIS — Z87891 Personal history of nicotine dependence: Secondary | ICD-10-CM | POA: Diagnosis not present

## 2020-01-22 DIAGNOSIS — M546 Pain in thoracic spine: Secondary | ICD-10-CM | POA: Diagnosis present

## 2020-01-22 DIAGNOSIS — Z7951 Long term (current) use of inhaled steroids: Secondary | ICD-10-CM

## 2020-01-22 DIAGNOSIS — K449 Diaphragmatic hernia without obstruction or gangrene: Secondary | ICD-10-CM | POA: Diagnosis not present

## 2020-01-22 DIAGNOSIS — Z8249 Family history of ischemic heart disease and other diseases of the circulatory system: Secondary | ICD-10-CM

## 2020-01-22 DIAGNOSIS — Z7982 Long term (current) use of aspirin: Secondary | ICD-10-CM

## 2020-01-22 LAB — CREATININE, SERUM
Creatinine, Ser: 1.33 mg/dL — ABNORMAL HIGH (ref 0.61–1.24)
GFR, Estimated: 58 mL/min — ABNORMAL LOW (ref >60)

## 2020-01-22 LAB — CMP (CANCER CENTER ONLY)
ALT: 453 U/L (ref 0–44)
AST: 854 U/L (ref 15–41)
Albumin: 2.2 g/dL — ABNORMAL LOW (ref 3.5–5.0)
Alkaline Phosphatase: 1078 U/L — ABNORMAL HIGH (ref 38–126)
Anion gap: 10 (ref 5–15)
BUN: 30 mg/dL — ABNORMAL HIGH (ref 8–23)
CO2: 21 mmol/L — ABNORMAL LOW (ref 22–32)
Calcium: 9 mg/dL (ref 8.9–10.3)
Chloride: 96 mmol/L — ABNORMAL LOW (ref 98–111)
Creatinine: 1.49 mg/dL — ABNORMAL HIGH (ref 0.61–1.24)
GFR, Estimated: 51 mL/min — ABNORMAL LOW (ref >60)
Glucose, Bld: 128 mg/dL — ABNORMAL HIGH (ref 70–99)
Potassium: 4.9 mmol/L (ref 3.5–5.1)
Sodium: 127 mmol/L — ABNORMAL LOW (ref 135–145)
Total Bilirubin: 10.1 mg/dL (ref 0.3–1.2)
Total Protein: 7.6 g/dL (ref 6.5–8.1)

## 2020-01-22 LAB — CBC
HCT: 36.9 % — ABNORMAL LOW (ref 39.0–52.0)
Hemoglobin: 11.9 g/dL — ABNORMAL LOW (ref 13.0–17.0)
MCH: 26.4 pg (ref 26.0–34.0)
MCHC: 32.2 g/dL (ref 30.0–36.0)
MCV: 81.8 fL (ref 80.0–100.0)
Platelets: 664 10*3/uL — ABNORMAL HIGH (ref 150–400)
RBC: 4.51 MIL/uL (ref 4.22–5.81)
RDW: 19.5 % — ABNORMAL HIGH (ref 11.5–15.5)
WBC: 10.6 10*3/uL — ABNORMAL HIGH (ref 4.0–10.5)
nRBC: 0.2 % (ref 0.0–0.2)

## 2020-01-22 LAB — SARS CORONAVIRUS 2 (TAT 6-24 HRS): SARS Coronavirus 2: NEGATIVE

## 2020-01-22 LAB — GLUCOSE, CAPILLARY: Glucose-Capillary: 145 mg/dL — ABNORMAL HIGH (ref 70–99)

## 2020-01-22 MED ORDER — MORPHINE SULFATE 15 MG PO TABS: 15.0000 mg | ORAL_TABLET | Freq: Once | ORAL | Status: DC

## 2020-01-22 MED ORDER — ASPIRIN EC 81 MG PO TBEC
81.0000 mg | DELAYED_RELEASE_TABLET | Freq: Every day | ORAL | Status: DC
  Administered 2020-01-22 – 2020-01-26 (×5): 81 mg via ORAL
  Filled 2020-01-22 (×5): qty 1

## 2020-01-22 MED ORDER — OXYCODONE HCL 5 MG PO TABS
10.0000 mg | ORAL_TABLET | Freq: Once | ORAL | Status: AC
  Administered 2020-01-22: 10 mg via ORAL

## 2020-01-22 MED ORDER — OXYCODONE HCL 5 MG PO TABS
ORAL_TABLET | ORAL | Status: AC
  Filled 2020-01-22: qty 2

## 2020-01-22 MED ORDER — MOMETASONE FURO-FORMOTEROL FUM 100-5 MCG/ACT IN AERO
2.0000 | INHALATION_SPRAY | Freq: Two times a day (BID) | RESPIRATORY_TRACT | Status: DC
  Administered 2020-01-22 – 2020-01-26 (×8): 2 via RESPIRATORY_TRACT
  Filled 2020-01-22: qty 8.8

## 2020-01-22 MED ORDER — ENOXAPARIN SODIUM 40 MG/0.4ML ~~LOC~~ SOLN
40.0000 mg | SUBCUTANEOUS | Status: DC
  Administered 2020-01-22 – 2020-01-25 (×4): 40 mg via SUBCUTANEOUS
  Filled 2020-01-22 (×4): qty 0.4

## 2020-01-22 MED ORDER — ONDANSETRON HCL 4 MG PO TABS: 4.0000 mg | ORAL_TABLET | Freq: Four times a day (QID) | ORAL | Status: DC | PRN

## 2020-01-22 MED ORDER — HYDROXYZINE HCL 25 MG PO TABS
25.0000 mg | ORAL_TABLET | Freq: Three times a day (TID) | ORAL | Status: DC | PRN
  Administered 2020-01-23 – 2020-01-24 (×2): 25 mg via ORAL
  Filled 2020-01-22 (×2): qty 1

## 2020-01-22 MED ORDER — SODIUM CHLORIDE 0.9 % IV SOLN: INTRAVENOUS | Status: DC

## 2020-01-22 MED ORDER — INSULIN ASPART 100 UNIT/ML ~~LOC~~ SOLN: 0.0000 [IU] | Freq: Every day | SUBCUTANEOUS | Status: DC

## 2020-01-22 MED ORDER — INSULIN ASPART 100 UNIT/ML ~~LOC~~ SOLN
0.0000 [IU] | Freq: Three times a day (TID) | SUBCUTANEOUS | Status: DC
  Administered 2020-01-23 – 2020-01-24 (×4): 3 [IU] via SUBCUTANEOUS
  Administered 2020-01-24 – 2020-01-26 (×3): 2 [IU] via SUBCUTANEOUS

## 2020-01-22 MED ORDER — PANTOPRAZOLE SODIUM 40 MG PO TBEC: 40.0000 mg | DELAYED_RELEASE_TABLET | Freq: Every day | ORAL | Status: DC | PRN

## 2020-01-22 MED ORDER — OXYCODONE HCL 5 MG PO TABS: 5.0000 mg | ORAL_TABLET | ORAL | Status: DC | PRN

## 2020-01-22 MED ORDER — ONDANSETRON HCL 4 MG/2ML IJ SOLN
4.0000 mg | Freq: Four times a day (QID) | INTRAMUSCULAR | Status: DC | PRN
  Administered 2020-01-25 – 2020-01-26 (×2): 4 mg via INTRAVENOUS
  Filled 2020-01-22 (×2): qty 2

## 2020-01-22 MED ORDER — HYDRALAZINE HCL 20 MG/ML IJ SOLN: 10.0000 mg | Freq: Three times a day (TID) | INTRAMUSCULAR | Status: DC | PRN

## 2020-01-22 MED ORDER — FENTANYL CITRATE (PF) 100 MCG/2ML IJ SOLN
25.0000 ug | INTRAMUSCULAR | Status: DC | PRN
  Administered 2020-01-22 – 2020-01-25 (×10): 25 ug via INTRAVENOUS
  Filled 2020-01-22 (×10): qty 2

## 2020-01-22 MED ORDER — ALBUTEROL SULFATE HFA 108 (90 BASE) MCG/ACT IN AERS
1.0000 | INHALATION_SPRAY | Freq: Four times a day (QID) | RESPIRATORY_TRACT | Status: DC | PRN
  Filled 2020-01-22: qty 6.7

## 2020-01-22 MED ORDER — MAGIC MOUTHWASH
5.0000 mL | Freq: Four times a day (QID) | ORAL | Status: DC | PRN
  Filled 2020-01-22: qty 10

## 2020-01-22 MED ORDER — LATANOPROST 0.005 % OP SOLN
1.0000 [drp] | Freq: Every day | OPHTHALMIC | Status: DC
  Administered 2020-01-22 – 2020-01-25 (×4): 1 [drp] via OPHTHALMIC
  Filled 2020-01-22: qty 2.5

## 2020-01-22 NOTE — Telephone Encounter (Signed)
CRITICAL VALUE STICKER  CRITICAL VALUE: Total Bili = 10.1, AST = 854, ALT = 453  RECEIVER (on-site recipient of call): Yetta Glassman, CMA  DATE & TIME NOTIFIED: 01/22/20 at 10:28am  MESSENGER (representative from lab): Hillary  MD NOTIFIED: Dr. Benay Spice  TIME OF NOTIFICATION: 10:37am  RESPONSE: Notification given to Manuela Schwartz, RN for follow-up with provider.

## 2020-01-22 NOTE — Progress Notes (Signed)
[place] OFFICE PROGRESS NOTE   Diagnosis: Hepatocellular carcinoma  INTERVAL HISTORY:   [name] returns for follow-up.  He was referred for a paracentesis earlier this week.  No significant ascites found by ultrasound.  Earlier this week oxycodone was prescribed as needed for right-sided abdominal pain.  He reports that particular pain is improved with the pain medication but now he is having right lateral chest/lateral abdominal pain.  He denies nausea/vomiting.  No bleeding.  No diarrhea.  He denies fever.  No cough or dyspnea.  Appetite is poor.  Objective:  Vital signs in last 24 hours:  Blood pressure 124/74, pulse 99, temperature 99.1 F (37.3 C), temperature source Tympanic, resp. rate 16, height 5\' 4"  (1.626 m), weight 188 lb 14.4 oz (85.7 kg), SpO2 100 %.    HEENT: Scleral icterus.  No thrush. Resp: Lungs clear bilaterally. Cardio: Regular rate and rhythm. GI: Abdomen is distended.  Liver is palpable, associated tenderness, right abdomen. Vascular: No leg edema. Musculoskeletal: Tender over the posterior/lateral chest wall.    Lab Results:  Lab Results  Component Value Date   WBC 11.0 (H) 01/20/2020   HGB 13.0 01/20/2020   HCT 39.4 01/20/2020   MCV 79.8 (L) 01/20/2020   PLT 807 (H) 01/20/2020   NEUTROABS 8.4 (H) 01/20/2020    Imaging:  US Abdomen Limited  Result Date: 01/20/2020 CLINICAL DATA:  HCC, ascites search EXAM: LIMITED ABDOMEN ULTRASOUND FOR ASCITES TECHNIQUE: Limited ultrasound survey for ascites was performed in all four abdominal quadrants. COMPARISON:  01/08/2020 FINDINGS: Four quadrant ascites search reveals no significant ascites. IMPRESSION: No significant ascites. Electronically Signed   By: Eddie Candle M.D.   On: 01/20/2020 14:13    Medications: I have reviewed the patient's current medications.  Assessment/Plan: 1. Hepatocellular carcinoma  02/12/2019 AFP 10,200  02/18/2019 CT abdomen-dominant poorly marginated  heterogeneously enhancing 8.6 x 7.7 cm posterior right liver lobe mass, new compared to CT study 09/10/2016. Numerous similar-appearing liver masses scattered throughout the liver all new since 09/10/2016 CT. Diffusely irregular liver surface compatible with cirrhosis. Spleen normal size. No ascites. No abdominal adenopathy.  MRI liver 03/05/2019-multifocal hepatic metastases including a 10 cm lesion in the right liver,Li-rads 5, no evidence of metastatic disease  Cycle 1 atezolizumab/bevacizumab 03/20/2019  Cycle 2 atezolizumab/bevacizumab 04/24/2019  Cycle 3 atezolizumab/bevacizumab 05/15/2019  Cycle 4 atezolizumab/bevacizumab 06/05/2019  MRI liver 06/24/2019-multifocal hepatocellular carcinoma, lesions stable to decreased in size. No evidence for disease progression. Cirrhosis.  Cycle 5 atezolizumab/bevacizumab 06/25/2019  Cycle 6 atezolizumab/bevacizumab 07/17/2019  Cycle 7 atezolizumab/bevacizumab 08/07/2019  Cycle 8 atezolizumab/bevacizumab 08/28/2019  Cycle 9atezolizumab/bevacizumab 09/18/2019  Cycle 10 atezolizumab/bevacizumab 10/09/2019  MRI liver 10/27/2019-enlargement of multifocal liver lesions, cirrhosis  Lenvatinib beginning 11/10/2019, held starting 11/23/2019 secondary to diarrhea and mucositis  Lenvatinib resumed at 4 mg daily beginning 12/01/2019  CT  abdomen/pelvis 01/07/2020-nodular hepatic contours noted concerning for cirrhosis.  Multiple ill-defined low densities throughout hepatic parenchyma.  Minimal right effusion with adjacent subsegmental atelectasis.  CT chest 01/09/2020-small right pleural effusion with adjacent compressive atelectasis; lytic lesion involving the right posterior second rib at the costovertebral junction with extraosseous soft tissue component and bony expansion suspicious for metastatic disease.  Lucent lesion within the left lateral fifth rib also present exam 1 year ago and not significantly changed. 2. 01/20/2019 chest CT-no definite evidence of  pulmonary embolus. Wall thickening of the distal esophagus. Nodular hepatic contours. 3. Cirrhosis  4. Hepatitis C status post [place] 5. Type 2 diabetes 6. COPD 7. CAD 8.  Chronic diastolic heart failure 9. Hypertension 10. Positive Cologuard 2019 11. Diarrhea and mucositis 11/23/2019 12. Dehydration secondary to diarrhea 11/23/2019 13. Right arm/shoulder pain-plain x-ray right shoulder with slight irregularity inferior glenoid, no fracture or dislocation; right humerus with mild degenerative changes in the acromioclavicular joint, thinning humeral joint, no acute fracture or dislocation and no lytic or sclerotic lesion noted.Pain resolved 11/30/2019.CT 12/10/2019-suspected full-thickness supraspinatus tear with mild muscle atrophy. 3 x 4 x 9 mm oval lucent lesion in the posterior cortex of the proximal humeral metaphysis, nonspecific, unlikely to represent metastatic disease; moderate acromioclavicular and mild glenohumeral osteoarthritis. [facility] admission 01/07/2020-sepsis   Disposition: [name] has hepatocellular carcinoma.  Labs show severe hyperbilirubinemia, transaminitis.  He has pain that is poorly controlled.  He understands the likely underlying etiology is progression of the cancer.  We discussed changing to a supportive/comfort care approach with hospice referral versus hospital admission for pain control, evaluation for any type of reversible cause of liver function abnormalities/pain.  He would like to proceed with hospital admission.  We are making arrangements for hospitalization.  Patient seen with Dr. Benay Spice.    Ned Card ANP/GNP-BC   01/22/2020  11:38 AM  This was a shared visit with Ned Card.  Mr. Damron was interviewed and examined.  He has severe pain in the right upper abdomen and right chest.  I suspect the pain is due to progressive disease in the liver.  However he is tender over the chest wall, he could have rib metastases not appreciated on the  CTs earlier this month.  I recommend hospital admission for pain control and further evaluation of the marked hyperbilirubinemia.  I suspect the elevated bilirubin and liver enzymes are elevated due to progressive HCC in the liver in the setting of cirrhosis.  However it is possible he has developed bile duct obstruction amenable to a stent.  I recommend hospice care if progressive hepatocellular carcinoma is confirmed.  Please call oncology as needed over the weekend.  I will see him 01/25/2020.  I appreciate the care from the medical service.

## 2020-01-22 NOTE — H&P (Signed)
History and Physical    [name] VOH:[id number] DOB: Jul 04, 1950 DOA: 01/22/2020  PCP: Hoyt Koch, MD  Patient coming from: Home  Chief Complaint: abdominal pain  HPI: [name] is a 69 y.o. male with medical history significant of HCC/cirrhosis. Presenting with right flank and abdominal pain. Recently in the hospital for the same. He says his pain has worsened since discharge. He tried increasing his oxycontin, but that did not provide complete relief. He went to his oncologist today and it was recommended that he come to the hospital. Dr. Benay Spice called for direct admission.    Has had constant pain in right back and side since discharge from hospital. [name], they recommended that he go to the ED. Tried oxycontin, but it did not provide complete relief. No F/N/V/D. No sick contacts. No dyspnea, palpitations.    Review of Systems:  Denies F/N/V/D, sick contacts. Denies dyspnea, palpitations. Review of systems is otherwise negative for all not mentioned in HPI.   PMHx Past Medical History:  Diagnosis Date  . Alcoholism ([place])   . Arthritis    "knees, hands" (03/08/2015)  . Asthma   . Chronic bronchitis ([place])   . Chronic diastolic CHF (congestive heart failure) ([place])    notes 03/08/2015  . Cirrhosis ([place])   . COPD (chronic obstructive pulmonary disease) ([place])   . Coronary artery disease   . Hepatitis C   . High cholesterol   . Hypertension   . Pneumonia 03/2015  . Shortness of breath dyspnea   . Type II diabetes mellitus ([place]) 2014    PSHx Past Surgical History:  Procedure Laterality Date  . CATARACT EXTRACTION Left 02/2014  . ESOPHAGOGASTRODUODENOSCOPY  02/09/2011   Procedure: ESOPHAGOGASTRODUODENOSCOPY (EGD);  Surgeon: Missy Sabins, MD;  Location: [facility] ENDOSCOPY;  Service: Endoscopy;  Laterality: N/A;  . TOTAL KNEE ARTHROPLASTY Left 10/22/2009    SocHx  reports that he quit smoking about 6 years ago. His smoking use  included cigarettes. He has a 69.00 pack-year smoking history. He has never used smokeless tobacco. He reports that he does not drink alcohol and does not use drugs.  Allergies  Allergen Reactions  . Crestor [Rosuvastatin] Other (See Comments)    INTOLERANCE   . Metformin And Related Other (See Comments)    Stomach ache  . Mobic [Meloxicam] Rash    FamHx Family History  Problem Relation Age of Onset  . Stroke Mother   . Diabetes Mother   . Hypertension Mother   . Hyperlipidemia Mother   . Heart disease Father   . Hypertension Father   . Heart attack Father     Prior to Admission medications   Medication Sig Start Date End Date Taking? Authorizing Provider  aspirin EC 81 MG tablet Take 1 tablet (81 mg total) by mouth daily. 08/31/13  Yes Advani, Vernon Prey, MD  atorvastatin (LIPITOR) 40 MG tablet TAKE 1 TABLET EVERY DAY 10/22/19  Yes Hoyt Koch, MD  betamethasone valerate ointment (VALISONE) 0.1 % APPLY ONE APPLICATION TOPICALLY TWO TIMES DAILY 12/07/19  Yes Hoyt Koch, MD  chlorproMAZINE (THORAZINE) 25 MG tablet Take 1 tablet (25 mg total) by mouth 4 (four) times daily as needed for hiccoughs (not relieved with baclofen). 01/12/20  Yes Pokhrel, Laxman, MD  cholecalciferol (VITAMIN D) 1000 UNITS tablet Take 1 tablet (1,000 Units total) by mouth daily. 08/20/12  Yes Robbie Lis, MD  Cinnamon 500 MG capsule Take 500 mg  by mouth 2 (two) times daily.    Yes [provider]  Fluticasone-Salmeterol (ADVAIR) 100-50 MCG/DOSE AEPB Inhale 1 puff into the lungs 2 (two) times daily. 07/03/19  Yes Hoyt Koch, MD  HOMEOPATHIC PRODUCTS PO Take by mouth daily. "Hyland's Leg Cramps"    Yes [provider]  hydrOXYzine (ATARAX/VISTARIL) 25 MG tablet Take 1 tablet by mouth three times daily as needed Patient taking differently: Take 25 mg by mouth every 8 (eight) hours as needed for anxiety or itching.  10/07/19  Yes Hoyt Koch, MD  insulin  NPH-regular Human (NOVOLIN 70/30) (70-30) 100 UNIT/ML injection Inject 160 Units into the skin daily with breakfast. 08/26/19  Yes Renato Shin, MD  latanoprost (XALATAN) 0.005 % ophthalmic solution Place 1 drop into both eyes daily.  04/09/19  Yes [provider]  lenvatinib 4 mg daily dose (LENVIMA) capsule Take 1 capsule (4 mg total) by mouth daily. 01/05/20  Yes Ladell Pier, MD  lisinopril-hydrochlorothiazide (ZESTORETIC) 20-25 MG tablet Take 1 tablet by mouth daily. APPOINTMENT NEEDED FOR ADDITIONAL REFILLS 11/02/19  Yes Hoyt Koch, MD  magic mouthwash SOLN Take 5-10 mLs by mouth 4 (four) times daily as needed for mouth pain (Swish and swallow). 11/23/19  Yes Ladell Pier, MD  Multiple Vitamins-Minerals (MULTIVITAMIN WITH MINERALS) tablet Take 1 tablet by mouth daily.    Yes [provider]  Omega-3 Fatty Acids (FISH OIL) 1000 MG CAPS Take 2 capsules by mouth daily.    Yes [provider]  oxyCODONE (OXY IR/ROXICODONE) 5 MG immediate release tablet Take 1-2 tablets (5-10 mg total) by mouth every 4 (four) hours as needed for severe pain. Do not drive while taking 88/28/00  Yes [name], NP  pantoprazole (PROTONIX) 40 MG tablet TAKE 1 TABLET BY MOUTH ONCE DAILY BEFORE  BREAKFAST. Patient taking differently: Take 40 mg by mouth daily as needed (heartburn).  04/16/19  Yes Armbruster, Carlota Raspberry, MD  polyethylene glycol (MIRALAX / GLYCOLAX) 17 g packet Take 17 g by mouth daily. 01/12/20  Yes Pokhrel, Laxman, MD  traZODone (DESYREL) 50 MG tablet TAKE 1 TABLET AT BEDTIME AS NEEDED FOR SLEEP Patient taking differently: Take 50 mg by mouth at bedtime as needed for sleep.  11/20/19  Yes Biagio Borg, MD  Accu-Chek FastClix Lancets MISC 1 each by Does not apply route 4 (four) times daily. E11.9 04/16/19   Renato Shin, MD  albuterol (VENTOLIN HFA) 108 (90 Base) MCG/ACT inhaler Inhale 1-2 puffs into the lungs every 4 (four) hours as needed for wheezing or shortness  of breath. Patient not taking: Reported on 01/22/2020 01/09/16   Hoyt Koch, MD  Alcohol Swabs (B-D SINGLE USE SWABS REGULAR) PADS Use to swab finger 4 times daily. 04/13/19   Renato Shin, MD  baclofen 5 MG TABS Take 5 mg by mouth 3 (three) times daily as needed (hiccups). Patient not taking: Reported on 01/18/2020 01/12/20   Pokhrel, Corrie Mckusick, MD  Blood Glucose Calibration (ACCU-CHEK GUIDE CONTROL) LIQD 1 each by Other route as needed (Use to calibrate meter prn). E11.9 04/16/19   Renato Shin, MD  Blood Glucose Monitoring Suppl (ACCU-CHEK GUIDE ME) w/Device KIT 1 kit by Does not apply route See admin instructions. Check blood sugar 4 times a day 04/13/19   Renato Shin, MD  glucose blood (ACCU-CHEK GUIDE) test strip 1 each by Other route 4 (four) times daily. 04/13/19   Renato Shin, MD  Insulin Syringe-Needle U-100 (BD VEO INSULIN SYRINGE U/F) 31G  X 15/64" 1 ML MISC Use for injecting insulin once a day. 04/13/19   Renato Shin, MD  methocarbamol (ROBAXIN) 500 MG tablet Take 1 tablet (500 mg total) by mouth 2 (two) times daily. Patient not taking: Reported on 01/07/2020 01/20/19   Arlean Hopping C, PA-C  sildenafil (VIAGRA) 100 MG tablet Take 0.5-1 tablets (50-100 mg total) by mouth daily as needed for erectile dysfunction. Patient not taking: Reported on 01/07/2020 08/12/18   Hoyt Koch, MD    Physical Exam: Vitals:   01/22/20 1515 01/22/20 1523  BP: 136/74   Pulse: 97   Resp: 17   Temp: 97.7 F (36.5 C)   TempSrc: Oral   SpO2: 97%   Weight:  83.9 kg  Height:  '5\' 4"'  (1.626 m)    General: 69 y.o. male resting in bed in NAD Eyes: PERRL, normal sclera ENMT: Nares patent w/o discharge, orophaynx clear, dentition normal, ears w/o discharge/lesions/ulcers Neck: Supple, trachea midline Cardiovascular: tachy, +S1, S2, no m/g/r, equal pulses throughout Respiratory: CTABL, no w/r/r, normal WOB GI: BS+, ND, TTP RUQ/right flank, no masses noted, no organomegaly noted MSK: No  e/c/c Skin: No rashes, bruises, ulcerations noted Neuro: A&O x 3, no focal deficits Psyc: Appropriate interaction and affect, calm/cooperative  Labs on Admission: I have personally reviewed following labs and imaging studies  CBC: Recent Labs  Lab 01/20/20 0903  WBC 11.0*  NEUTROABS 8.4*  HGB 13.0  HCT 39.4  MCV 79.8*  PLT 212*   Basic Metabolic Panel: Recent Labs  Lab 01/20/20 0903 01/22/20 0921  NA 129* 127*  K 4.1 4.9  CL 96* 96*  CO2 22 21*  GLUCOSE 85 128*  BUN 16 30*  CREATININE 1.18 1.49*  CALCIUM 8.9 9.0   GFR: Estimated Creatinine Clearance: 46.4 mL/min (A) (by C-G formula based on SCr of 1.49 mg/dL (H)). Liver Function Tests: Recent Labs  Lab 01/20/20 0903 01/22/20 0921  AST 772* 854*  ALT 478* 453*  ALKPHOS 995* 1,078*  BILITOT 9.1* 10.1*  PROT 7.8 7.6  ALBUMIN 2.3* 2.2*   No results for input(s): LIPASE, AMYLASE in the last 168 hours. No results for input(s): AMMONIA in the last 168 hours. Coagulation Profile: No results for input(s): INR, PROTIME in the last 168 hours. Cardiac Enzymes: No results for input(s): CKTOTAL, CKMB, CKMBINDEX, TROPONINI in the last 168 hours. BNP (last 3 results) No results for input(s): PROBNP in the last 8760 hours. HbA1C: No results for input(s): HGBA1C in the last 72 hours. CBG: No results for input(s): GLUCAP in the last 168 hours. Lipid Profile: No results for input(s): CHOL, HDL, LDLCALC, TRIG, CHOLHDL, LDLDIRECT in the last 72 hours. Thyroid Function Tests: No results for input(s): TSH, T4TOTAL, FREET4, T3FREE, THYROIDAB in the last 72 hours. Anemia Panel: No results for input(s): VITAMINB12, FOLATE, FERRITIN, TIBC, IRON, RETICCTPCT in the last 72 hours. Urine analysis:    Component Value Date/Time   COLORURINE YELLOW 01/07/2020 1530   APPEARANCEUR CLEAR 01/07/2020 1530   LABSPEC 1.027 01/07/2020 1530   PHURINE 6.0 01/07/2020 1530   GLUCOSEU NEGATIVE 01/07/2020 1530   HGBUR NEGATIVE 01/07/2020 1530    BILIRUBINUR NEGATIVE 01/07/2020 1530   KETONESUR NEGATIVE 01/07/2020 1530   PROTEINUR NEGATIVE 01/07/2020 1530   UROBILINOGEN 1.0 01/12/2014 0911   NITRITE NEGATIVE 01/07/2020 1530   LEUKOCYTESUR NEGATIVE 01/07/2020 1530    Radiological Exams on Admission: No results found.  Assessment/Plan RUQ/R chest pain Hx of HCC, cirrhosis Elevated LFTs, T bili     -  admit to inpatient, med-surg     - will get non-contrast CT ab/pelvis; if non-diagnostic and move to non contrasted/contrasted MR (spoke with radiology about this course given his renal function)     - fluids, pain control     - Onco team to follow     - I have consulted [facility]  AKI Hyponatremia     - fluids, follow  HTN     - hold home lisinopril, HCTZ d/t renal fxn     - PRN hydralazine  COPD     - dulera scheduled, albuerol PRN  HLD     - hold statin for now d/t LFTs  DM2     - SSI, DM diet, glucose checks  GERD     - protonix  DVT prophylaxis: lovenox  Code Status: FULL  Family Communication: None at bedside  Consults called: GI, Onco  Status is: Inpatient  Remains inpatient appropriate because:Inpatient level of care appropriate due to severity of illness   Dispo: The patient is from: Home              Anticipated d/c is to: Home              Anticipated d/c date is: 3 days              Patient currently is not medically stable to d/c.  Jonnie Finner DO [facility]  If 7PM-7AM, please contact night-coverage [email or web address]  01/22/2020, 3:54 PM

## 2020-01-22 NOTE — Progress Notes (Signed)
@   1400 reports his abdominal pain is less at 8/10. Eating crackers and cheese in exam room. @ 1505: Transported via w/c to #[id number] in good condition.

## 2020-01-22 NOTE — Progress Notes (Signed)
Transported patient via W/C to [place]: [id number] in stable condition for admission for [place], pain control and hyperbilirubinemia. Patient accepted by Lenna Sciara, RN. Report called earlier to nurse and COVID test performed at [facility] prior to admission today per protocol.

## 2020-01-23 ENCOUNTER — Inpatient Hospital Stay (HOSPITAL_COMMUNITY): Payer: Medicare HMO

## 2020-01-23 DIAGNOSIS — R7989 Other specified abnormal findings of blood chemistry: Secondary | ICD-10-CM

## 2020-01-23 DIAGNOSIS — K7200 Acute and subacute hepatic failure without coma: Secondary | ICD-10-CM

## 2020-01-23 DIAGNOSIS — C220 Liver cell carcinoma: Principal | ICD-10-CM

## 2020-01-23 DIAGNOSIS — R1011 Right upper quadrant pain: Secondary | ICD-10-CM

## 2020-01-23 LAB — COMPREHENSIVE METABOLIC PANEL
ALT: 312 U/L — ABNORMAL HIGH (ref 0–44)
AST: 576 U/L — ABNORMAL HIGH (ref 15–41)
Albumin: 2.3 g/dL — ABNORMAL LOW (ref 3.5–5.0)
Alkaline Phosphatase: 871 U/L — ABNORMAL HIGH (ref 38–126)
Anion gap: 10 (ref 5–15)
BUN: 29 mg/dL — ABNORMAL HIGH (ref 8–23)
CO2: 22 mmol/L (ref 22–32)
Calcium: 8.1 mg/dL — ABNORMAL LOW (ref 8.9–10.3)
Chloride: 94 mmol/L — ABNORMAL LOW (ref 98–111)
Creatinine, Ser: 1 mg/dL (ref 0.61–1.24)
GFR, Estimated: >60 mL/min (ref >60)
Glucose, Bld: 189 mg/dL — ABNORMAL HIGH (ref 70–99)
Potassium: 4.4 mmol/L (ref 3.5–5.1)
Sodium: 126 mmol/L — ABNORMAL LOW (ref 135–145)
Total Bilirubin: 9.5 mg/dL — ABNORMAL HIGH (ref 0.3–1.2)
Total Protein: 6.5 g/dL (ref 6.5–8.1)

## 2020-01-23 LAB — CBC
HCT: 36.3 % — ABNORMAL LOW (ref 39.0–52.0)
Hemoglobin: 11.6 g/dL — ABNORMAL LOW (ref 13.0–17.0)
MCH: 26.4 pg (ref 26.0–34.0)
MCHC: 32 g/dL (ref 30.0–36.0)
MCV: 82.5 fL (ref 80.0–100.0)
Platelets: 688 10*3/uL — ABNORMAL HIGH (ref 150–400)
RBC: 4.4 MIL/uL (ref 4.22–5.81)
RDW: 19.2 % — ABNORMAL HIGH (ref 11.5–15.5)
WBC: 9.7 10*3/uL (ref 4.0–10.5)
nRBC: 0 % (ref 0.0–0.2)

## 2020-01-23 LAB — GLUCOSE, CAPILLARY
Glucose-Capillary: 164 mg/dL — ABNORMAL HIGH (ref 70–99)
Glucose-Capillary: 155 mg/dL — ABNORMAL HIGH (ref 70–99)
Glucose-Capillary: 161 mg/dL — ABNORMAL HIGH (ref 70–99)
Glucose-Capillary: 186 mg/dL — ABNORMAL HIGH (ref 70–99)

## 2020-01-23 MED ORDER — HYDROMORPHONE HCL 1 MG/ML IJ SOLN
INTRAMUSCULAR | Status: AC
  Filled 2020-01-23: qty 1

## 2020-01-23 MED ORDER — OXYCODONE HCL 5 MG PO TABS
10.0000 mg | ORAL_TABLET | ORAL | Status: DC | PRN
  Administered 2020-01-24 – 2020-01-26 (×7): 10 mg via ORAL
  Filled 2020-01-23 (×7): qty 2

## 2020-01-23 MED ORDER — HYDROMORPHONE HCL 1 MG/ML IJ SOLN
1.0000 mg | Freq: Once | INTRAMUSCULAR | Status: AC
  Administered 2020-01-23: 1 mg via INTRAVENOUS

## 2020-01-23 MED ORDER — METHOCARBAMOL 1000 MG/10ML IJ SOLN
500.0000 mg | Freq: Four times a day (QID) | INTRAVENOUS | Status: DC | PRN
  Administered 2020-01-23 – 2020-01-25 (×5): 500 mg via INTRAVENOUS
  Filled 2020-01-23 (×3): qty 500
  Filled 2020-01-23: qty 5
  Filled 2020-01-23: qty 500
  Filled 2020-01-23: qty 5
  Filled 2020-01-23: qty 500
  Filled 2020-01-23: qty 5

## 2020-01-23 MED ORDER — GADOBUTROL 1 MMOL/ML IV SOLN
8.0000 mL | Freq: Once | INTRAVENOUS | Status: AC | PRN
  Administered 2020-01-23: 8 mL via INTRAVENOUS

## 2020-01-23 NOTE — Progress Notes (Signed)
PROGRESS NOTE    [name]  YFV:[id number] DOB: [birth year] DOA: 01/22/2020 PCP: Hoyt Koch, MD   Chief Complain: Abdominal pain  Brief Narrative: Patient is a 69 year old male with history of hepatocellular carcinoma with cirrhosis who presented to the emergency department complaints of  abdominal pain.  He was recently admitted and discharged from here after being managed for severe sepsis due to Enterococcus UTI.[name]  Patient stated that he had worsening right upper quadrant pain  after discharge.  He was directed  by his oncologist Dr. Learta Codding to be admitted.  Patient found to have elevated liver enzymes on presentation.  Complains of severe upper back pain.  Assessment & Plan:   Active Problems:   Abdominal pain  Right upper quadrant pain: Complaint of worsening abdominal pain after discharge from here.  Found to have liver enzymes elevation from before.  CT abdomen/pelvis not show any acute findings but showed chronic changes with cirrhosis.  Continue pain management, supportive care  Elevated liver enzymes: Could be from biliary obstruction from progressive HCC.  GI consulted for possible consideration of biliary stenting.  MRCP pending.  Right upper back pain: Patient has been complaining of severe right upper back pain not responding to analgesics.  I have added muscle relaxants on the  list.  Pain could be from chronic degenerative changes in the spine.  He has been having generalized pain: Abdominal, chest, neck, right shoulder.  We will continue to monitor.  Hepatocellular carcinoma/cirrhosis: Follows with oncology, Dr. Learta Codding.  AKI/hyponatremia: AKI cud be from dehydration.Improving with   IV fluids.  He has chronic hyponatremia most likely associated with cirrhosis.  Hypertension: Home lisinopril on hold.  Hydrochlorothiazide also on hold.  Continue to monitor blood pressure  COPD: Continue bronchodilators as needed.  Hyperlipidemia: Statin stopped due to  elevated liver enzymes  GERD: Continue Protonix  Diabetes type 2: Continue sliding-scale insulin.  Monitor blood sugars  Goals of care: Patient with hepatocellular carcinoma with possible progression of the disease.  Palliative care has been consulted.           DVT prophylaxis:Lovenox Code Status:  Family Communication: None at the bedside Status is: Inpatient  Remains inpatient appropriate because:Inpatient level of care appropriate due to severity of illness   Dispo: The patient is from: Home              Anticipated d/c is to: Home              Anticipated d/c date is: 2 days              Patient currently is not medically stable to d/c.     Consultants: GI  Procedures:None  Antimicrobials:  Anti-infectives (From admission, onward)   None      Subjective:  Patient seen and examined the bedside this morning.  Hemodynamically stable during my evaluation.  Was complaining of severe upper back pain more than abdominal pain.  Objective: Vitals:   01/22/20 2345 01/23/20 0320 01/23/20 0450 01/23/20 0816  BP: 124/65 121/66 (!) 142/75   Pulse: 91 88 89   Resp: 14 14 16    Temp: 97.6 F (36.4 C) (!) 97.4 F (36.3 C)    TempSrc: Oral Oral    SpO2: 99% 98% 100% 99%  Weight:      Height:       No intake or output data in the 24 hours ending 01/23/20 0841 Filed Weights   01/22/20 1523  Weight: 83.9 kg  Examination:  General exam: Uncomfortable due to back pain Respiratory system: Bilateral equal air entry, normal vesicular breath sounds, no wheezes or crackles  Cardiovascular system: S1 & S2 heard, RRR. No JVD, murmurs, rubs, gallops or clicks. No pedal edema. Gastrointestinal system: Abdomen is distended, soft and nontender. No organomegaly or masses felt. Normal bowel sounds heard. Central nervous system: Alert and oriented. No focal neurological deficits. Extremities: No edema, no clubbing ,no cyanosis Skin: No rashes, lesions or ulcers,no icterus  ,no pallor    Data Reviewed: I have personally reviewed following labs and imaging studies  CBC: Recent Labs  Lab 01/20/20 0903 01/22/20 1825 01/23/20 0653  WBC 11.0* 10.6* 9.7  NEUTROABS 8.4*  --   --   HGB 13.0 11.9* 11.6*  HCT 39.4 36.9* 36.3*  MCV 79.8* 81.8 82.5  PLT 807* 664* 272*   Basic Metabolic Panel: Recent Labs  Lab 01/20/20 0903 01/22/20 0921 01/22/20 1825 01/23/20 0653  NA 129* 127*  --  126*  K 4.1 4.9  --  4.4  CL 96* 96*  --  94*  CO2 22 21*  --  22  GLUCOSE 85 128*  --  189*  BUN 16 30*  --  29*  CREATININE 1.18 1.49* 1.33* 1.00  CALCIUM 8.9 9.0  --  8.1*   GFR: Estimated Creatinine Clearance: 69.1 mL/min (by C-G formula based on SCr of 1 mg/dL). Liver Function Tests: Recent Labs  Lab 01/20/20 0903 01/22/20 0921 01/23/20 0653  AST 772* 854* 576*  ALT 478* 453* 312*  ALKPHOS 995* 1,078* 871*  BILITOT 9.1* 10.1* 9.5*  PROT 7.8 7.6 6.5  ALBUMIN 2.3* 2.2* 2.3*   No results for input(s): LIPASE, AMYLASE in the last 168 hours. No results for input(s): AMMONIA in the last 168 hours. Coagulation Profile: No results for input(s): INR, PROTIME in the last 168 hours. Cardiac Enzymes: No results for input(s): CKTOTAL, CKMB, CKMBINDEX, TROPONINI in the last 168 hours. BNP (last 3 results) No results for input(s): PROBNP in the last 8760 hours. HbA1C: No results for input(s): HGBA1C in the last 72 hours. CBG: Recent Labs  Lab 01/22/20 2014 01/23/20 0744  GLUCAP 145* 164*   Lipid Profile: No results for input(s): CHOL, HDL, LDLCALC, TRIG, CHOLHDL, LDLDIRECT in the last 72 hours. Thyroid Function Tests: No results for input(s): TSH, T4TOTAL, FREET4, T3FREE, THYROIDAB in the last 72 hours. Anemia Panel: No results for input(s): VITAMINB12, FOLATE, FERRITIN, TIBC, IRON, RETICCTPCT in the last 72 hours. Sepsis Labs: No results for input(s): PROCALCITON, LATICACIDVEN in the last 168 hours.  Recent Results (from the past 240 hour(s))  SARS  Coronavirus 2 (TAT 6-24 hrs)     Status: None   Collection Time: 01/22/20  2:22 PM   Specimen: Nasopharyngeal Swab  Result Value Ref Range Status   SARS Coronavirus 2 NEGATIVE NEGATIVE Final    Comment: (NOTE) SARS-CoV-2 target nucleic acids are NOT DETECTED.  The SARS-CoV-2 RNA is generally detectable in upper and lower respiratory specimens during the acute phase of infection. Negative results do not preclude SARS-CoV-2 infection, do not rule out co-infections with other pathogens, and should not be used as the sole basis for treatment or other patient management decisions. Negative results must be combined with clinical observations, patient history, and epidemiological information. The expected result is Negative.  Fact Sheet for Patients: SugarRoll.be  Fact Sheet for Healthcare Providers: https://www.woods-mathews.com/  This test is not yet approved or cleared by the [place] and  has been authorized  for detection and/or diagnosis of SARS-CoV-2 by FDA under an Emergency Use Authorization (EUA). This EUA will remain  in effect (meaning this test can be used) for the duration of the COVID-19 declaration under Se ction 564(b)(1) of the Act, 21 U.S.C. section 360bbb-3(b)(1), unless the authorization is terminated or revoked sooner.  Performed at [facility], [place] [address], [place], [place] [address]          Radiology Studies: CT ABDOMEN PELVIS WO CONTRAST  Result Date: 01/22/2020 CLINICAL DATA:  69 year old male with right lower quadrant abdominal pain. EXAM: CT ABDOMEN AND PELVIS WITHOUT CONTRAST TECHNIQUE: Multidetector CT imaging of the abdomen and pelvis was performed following the standard protocol without IV contrast. COMPARISON:  CT abdomen pelvis date 01/07/2020. FINDINGS: Evaluation of this exam is limited in the absence of intravenous contrast. Lower chest: The visualized lung bases are clear. There is  coronary vascular calcification. No intra-abdominal free air. Trace perihepatic ascites. Hepatobiliary: Morphologic changes of cirrhosis. The liver is enlarged measuring approximately 19 cm in midclavicular length. No intrahepatic biliary dilatation. High attenuating content within gallbladder may represent sludge or vicarious excretion of intravenous contrast from recent administration. No pericholecystic fluid or evidence of acute cholecystitis by CT. Pancreas: Unremarkable. No pancreatic ductal dilatation or surrounding inflammatory changes. Spleen: Normal in size without focal abnormality. Adrenals/Urinary Tract: The adrenal glands unremarkable. The kidneys, visualized ureters, and urinary bladder appear unremarkable. Stomach/Bowel: Small hiatal hernia. There is moderate stool throughout the colon. There is no bowel obstruction or active inflammation. The appendix is normal. Vascular/Lymphatic: Advanced aortoiliac atherosclerotic disease. The IVC is unremarkable. No portal venous gas. There is no adenopathy. Reproductive: Prostate and seminal vesicles are grossly unremarkable. No pelvic mass. Other: There is a 2 x 3 cm intramuscular lipoma along the right posterior flank. Musculoskeletal: Mild degenerative changes of the spine. No acute osseous pathology. Areas of sclerotic changes involving the right femoral head may represent early avascular necrosis. No cortical collapse or fragmentation. IMPRESSION: 1. No acute intra-abdominal or pelvic pathology. No bowel obstruction. Normal appendix. 2. Cirrhosis with trace perihepatic ascites. 3. Aortic Atherosclerosis (ICD10-I70.0). Electronically Signed   By: Anner Crete M.D.   On: 01/22/2020 20:35        Scheduled Meds: . aspirin EC  81 mg Oral Daily  . enoxaparin (LOVENOX) injection  40 mg Subcutaneous Q24H  . insulin aspart  0-15 Units Subcutaneous TID WC  . insulin aspart  0-5 Units Subcutaneous QHS  . latanoprost  1 drop Both Eyes QHS  .  mometasone-formoterol  2 puff Inhalation BID   Continuous Infusions: . sodium chloride 100 mL/hr at 01/23/20 0623     LOS: 1 day    Time spent:35 mins.More than 50% of that time was spent in counseling and/or coordination of care.      Shelly Coss, MD [facility] P11/20/2021, 8:41 AM

## 2020-01-23 NOTE — Consult Note (Addendum)
Referring Provider: Dr. Marylyn Ishihara, [facility] Primary Care Physician:  Hoyt Koch, MD Primary Gastroenterologist:  Dr. Henrene Pastor  Reason for Consultation:  Cirrhosis, [facility]  HPI: [name] is a 69 y.o. male with medical history significant of HCC/cirrhosis. Presenting with right flank, right abdominal pain, and right back pain. Says that it has been going on for a "while", but worsening recently.  Reaching a 10 on the pain scale.  No nausea or vomiting.  Some decreased appetite at times.  LFTs trending up significantly over the past 12 days.  AST 576, ALT 312, alk phos 871, total bili 9.5.  12 days ago total bilirubin was 2.7 and alk phos was 220.  AST was 130 and ALT was 78.  CT scan of the abdomen and pelvis without contrast showed the following:  IMPRESSION: 1. No acute intra-abdominal or pelvic pathology. No bowel obstruction. Normal appendix. 2. Cirrhosis with trace perihepatic ascites. 3. Aortic Atherosclerosis (ICD10-I70.0).  He tells me that he had 2 bowel movements this morning and had small streaks of blood with those.  Hemoglobin appears stable at 11.6 g.  Past Medical History:  Diagnosis Date  . Alcoholism ([place])   . Arthritis    "knees, hands" (03/08/2015)  . Asthma   . Chronic bronchitis ([place])   . Chronic diastolic CHF (congestive heart failure) ([place])    notes 03/08/2015  . Cirrhosis ([place])   . COPD (chronic obstructive pulmonary disease) ([place])   . Coronary artery disease   . Hepatitis C   . High cholesterol   . Hypertension   . Pneumonia 03/2015  . Shortness of breath dyspnea   . Type II diabetes mellitus ([place]) 2014    Past Surgical History:  Procedure Laterality Date  . CATARACT EXTRACTION Left 02/2014  . ESOPHAGOGASTRODUODENOSCOPY  02/09/2011   Procedure: ESOPHAGOGASTRODUODENOSCOPY (EGD);  Surgeon: Missy Sabins, MD;  Location: [facility] ENDOSCOPY;  Service: Endoscopy;  Laterality: N/A;  . TOTAL KNEE ARTHROPLASTY Left 10/22/2009    Prior to Admission medications     Medication Sig Start Date End Date Taking? Authorizing Provider  aspirin EC 81 MG tablet Take 1 tablet (81 mg total) by mouth daily. 08/31/13  Yes Advani, Vernon Prey, MD  atorvastatin (LIPITOR) 40 MG tablet TAKE 1 TABLET EVERY DAY 10/22/19  Yes Hoyt Koch, MD  betamethasone valerate ointment (VALISONE) 0.1 % APPLY ONE APPLICATION TOPICALLY TWO TIMES DAILY 12/07/19  Yes Hoyt Koch, MD  chlorproMAZINE (THORAZINE) 25 MG tablet Take 1 tablet (25 mg total) by mouth 4 (four) times daily as needed for hiccoughs (not relieved with baclofen). 01/12/20  Yes Pokhrel, Laxman, MD  cholecalciferol (VITAMIN D) 1000 UNITS tablet Take 1 tablet (1,000 Units total) by mouth daily. 08/20/12  Yes Robbie Lis, MD  Cinnamon 500 MG capsule Take 500 mg by mouth 2 (two) times daily.    Yes [provider]  Fluticasone-Salmeterol (ADVAIR) 100-50 MCG/DOSE AEPB Inhale 1 puff into the lungs 2 (two) times daily. 07/03/19  Yes Hoyt Koch, MD  HOMEOPATHIC PRODUCTS PO Take by mouth daily. "Hyland's Leg Cramps"    Yes [provider]  hydrOXYzine (ATARAX/VISTARIL) 25 MG tablet Take 1 tablet by mouth three times daily as needed Patient taking differently: Take 25 mg by mouth every 8 (eight) hours as needed for anxiety or itching.  10/07/19  Yes Hoyt Koch, MD  insulin NPH-regular Human (NOVOLIN 70/30) (70-30) 100 UNIT/ML injection Inject 160 Units into the skin daily with breakfast. 08/26/19  Yes Loanne Drilling,  Hilliard Clark, MD  latanoprost (XALATAN) 0.005 % ophthalmic solution Place 1 drop into both eyes daily.  04/09/19  Yes [provider]  lenvatinib 4 mg daily dose (LENVIMA) capsule Take 1 capsule (4 mg total) by mouth daily. 01/05/20  Yes Ladell Pier, MD  lisinopril-hydrochlorothiazide (ZESTORETIC) 20-25 MG tablet Take 1 tablet by mouth daily. APPOINTMENT NEEDED FOR ADDITIONAL REFILLS 11/02/19  Yes Hoyt Koch, MD  magic mouthwash SOLN Take 5-10 mLs by mouth 4 (four)  times daily as needed for mouth pain (Swish and swallow). 11/23/19  Yes Ladell Pier, MD  Multiple Vitamins-Minerals (MULTIVITAMIN WITH MINERALS) tablet Take 1 tablet by mouth daily.    Yes [provider]  Omega-3 Fatty Acids (FISH OIL) 1000 MG CAPS Take 2 capsules by mouth daily.    Yes [provider]  oxyCODONE (OXY IR/ROXICODONE) 5 MG immediate release tablet Take 1-2 tablets (5-10 mg total) by mouth every 4 (four) hours as needed for severe pain. Do not drive while taking 16/01/09  Yes [name], NP  pantoprazole (PROTONIX) 40 MG tablet TAKE 1 TABLET BY MOUTH ONCE DAILY BEFORE  BREAKFAST. Patient taking differently: Take 40 mg by mouth daily as needed (heartburn).  04/16/19  Yes Armbruster, Carlota Raspberry, MD  polyethylene glycol (MIRALAX / GLYCOLAX) 17 g packet Take 17 g by mouth daily. 01/12/20  Yes Pokhrel, Laxman, MD  traZODone (DESYREL) 50 MG tablet TAKE 1 TABLET AT BEDTIME AS NEEDED FOR SLEEP Patient taking differently: Take 50 mg by mouth at bedtime as needed for sleep.  11/20/19  Yes Biagio Borg, MD  Accu-Chek FastClix Lancets MISC 1 each by Does not apply route 4 (four) times daily. E11.9 04/16/19   Renato Shin, MD  albuterol (VENTOLIN HFA) 108 (90 Base) MCG/ACT inhaler Inhale 1-2 puffs into the lungs every 4 (four) hours as needed for wheezing or shortness of breath. Patient not taking: Reported on 01/22/2020 01/09/16   Hoyt Koch, MD  Alcohol Swabs (B-D SINGLE USE SWABS REGULAR) PADS Use to swab finger 4 times daily. 04/13/19   Renato Shin, MD  baclofen 5 MG TABS Take 5 mg by mouth 3 (three) times daily as needed (hiccups). Patient not taking: Reported on 01/18/2020 01/12/20   Pokhrel, Corrie Mckusick, MD  Blood Glucose Calibration (ACCU-CHEK GUIDE CONTROL) LIQD 1 each by Other route as needed (Use to calibrate meter prn). E11.9 04/16/19   Renato Shin, MD  Blood Glucose Monitoring Suppl (ACCU-CHEK GUIDE ME) w/Device KIT 1 kit by Does not apply route See admin  instructions. Check blood sugar 4 times a day 04/13/19   Renato Shin, MD  glucose blood (ACCU-CHEK GUIDE) test strip 1 each by Other route 4 (four) times daily. 04/13/19   Renato Shin, MD  Insulin Syringe-Needle U-100 (BD VEO INSULIN SYRINGE U/F) 31G X 15/64" 1 ML MISC Use for injecting insulin once a day. 04/13/19   Renato Shin, MD  methocarbamol (ROBAXIN) 500 MG tablet Take 1 tablet (500 mg total) by mouth 2 (two) times daily. Patient not taking: Reported on 01/07/2020 01/20/19   Arlean Hopping C, PA-C  sildenafil (VIAGRA) 100 MG tablet Take 0.5-1 tablets (50-100 mg total) by mouth daily as needed for erectile dysfunction. Patient not taking: Reported on 01/07/2020 08/12/18   Hoyt Koch, MD    Current Facility-Administered Medications  Medication Dose Route Frequency Provider Last Rate Last Admin  . 0.9 %  sodium chloride infusion   Intravenous Continuous Shelly Coss, MD 100 mL/hr at 01/23/20 3235 New  Bag at 01/23/20 0623  . albuterol (VENTOLIN HFA) 108 (90 Base) MCG/ACT inhaler 1 puff  1 puff Inhalation Q6H PRN Marylyn Ishihara, Tyrone A, DO      . aspirin EC tablet 81 mg  81 mg Oral Daily Kyle, Tyrone A, DO   81 mg at 01/22/20 2150  . enoxaparin (LOVENOX) injection 40 mg  40 mg Subcutaneous Q24H Kyle, Tyrone A, DO   40 mg at 01/22/20 2147  . fentaNYL (SUBLIMAZE) injection 25 mcg  25 mcg Intravenous Q4H PRN Marylyn Ishihara, Tyrone A, DO   25 mcg at 01/23/20 0743  . hydrALAZINE (APRESOLINE) injection 10 mg  10 mg Intravenous Q8H PRN Marylyn Ishihara, Tyrone A, DO      . hydrOXYzine (ATARAX/VISTARIL) tablet 25 mg  25 mg Oral Q8H PRN Marylyn Ishihara, Tyrone A, DO      . insulin aspart (novoLOG) injection 0-15 Units  0-15 Units Subcutaneous TID WC Kyle, Tyrone A, DO   3 Units at 01/23/20 0826  . insulin aspart (novoLOG) injection 0-5 Units  0-5 Units Subcutaneous QHS Kyle, Tyrone A, DO      . latanoprost (XALATAN) 0.005 % ophthalmic solution 1 drop  1 drop Both Eyes QHS Kyle, Tyrone A, DO   1 drop at 01/22/20 2146  . magic mouthwash   5-10 mL Oral QID PRN Marylyn Ishihara, Tyrone A, DO      . mometasone-formoterol (DULERA) 100-5 MCG/ACT inhaler 2 puff  2 puff Inhalation BID Marylyn Ishihara, Tyrone A, DO   2 puff at 01/23/20 0815  . ondansetron (ZOFRAN) tablet 4 mg  4 mg Oral Q6H PRN Marylyn Ishihara, Tyrone A, DO       Or  . ondansetron (ZOFRAN) injection 4 mg  4 mg Intravenous Q6H PRN Marylyn Ishihara, Tyrone A, DO      . oxyCODONE (Oxy IR/ROXICODONE) immediate release tablet 5 mg  5 mg Oral Q4H PRN Marylyn Ishihara, Tyrone A, DO      . pantoprazole (PROTONIX) EC tablet 40 mg  40 mg Oral Daily PRN Marylyn Ishihara, Tyrone A, DO        Allergies as of 01/22/2020 - Review Complete 01/22/2020  Allergen Reaction Noted  . Crestor [rosuvastatin] Other (See Comments) 02/09/2015  . Metformin and related Other (See Comments) 09/10/2016  . Mobic [meloxicam] Rash 07/04/2011    Family History  Problem Relation Age of Onset  . Stroke Mother   . Diabetes Mother   . Hypertension Mother   . Hyperlipidemia Mother   . Heart disease Father   . Hypertension Father   . Heart attack Father     Social History   Socioeconomic History  . Marital status: Divorced    Spouse name: Not on file  . Number of children: 4  . Years of education: Not on file  . Highest education level: Not on file  Occupational History  . Occupation: disabled  Tobacco Use  . Smoking status: Former Smoker    Packs/day: 1.50    Years: 46.00    Pack years: 69.00    Types: Cigarettes    Quit date: 03/19/2013    Years since quitting: 6.8  . Smokeless tobacco: Never Used  Vaping Use  . Vaping Use: Never used  Substance and Sexual Activity  . Alcohol use: No    Alcohol/week: 0.0 standard drinks    Comment: "recovering alcoholic; 7/62/8315"  . Drug use: No  . Sexual activity: Not Currently  Other Topics Concern  . Not on file  Social History Narrative  . Not on file  Social Determinants of Health   Financial Resource Strain:   . Difficulty of Paying Living Expenses: Not on file  Food Insecurity: No Food  Insecurity  . Worried About Charity fundraiser in the Last Year: Never true  . Ran Out of Food in the Last Year: Never true  Transportation Needs: No Transportation Needs  . Lack of Transportation (Medical): No  . Lack of Transportation (Non-Medical): No  Physical Activity:   . Days of Exercise per Week: Not on file  . Minutes of Exercise per Session: Not on file  Stress:   . Feeling of Stress : Not on file  Social Connections:   . Frequency of Communication with Friends and Family: Not on file  . Frequency of Social Gatherings with Friends and Family: Not on file  . Attends Religious Services: Not on file  . Active Member of Clubs or Organizations: Not on file  . Attends Archivist Meetings: Not on file  . Marital Status: Not on file  Intimate Partner Violence:   . Fear of Current or Ex-Partner: Not on file  . Emotionally Abused: Not on file  . Physically Abused: Not on file  . Sexually Abused: Not on file    Review of Systems: ROS is O/W negative except as mentioned in HPI.  Physical Exam: Vital signs in last 24 hours: Temp:  [97.4 F (36.3 C)-99.1 F (37.3 C)] 97.4 F (36.3 C) (11/20 0320) Pulse Rate:  [88-99] 89 (11/20 0450) Resp:  [14-17] 16 (11/20 0450) BP: (121-142)/(60-75) 142/75 (11/20 0450) SpO2:  [97 %-100 %] 99 % (11/20 0816) Weight:  [83.9 kg-85.7 kg] 83.9 kg (11/19 1523) Last BM Date: 01/21/20 General:  Alert, Well-developed, well-nourished, pleasant and cooperative in NAD Head:  Normocephalic and atraumatic. Eyes:  Scleral icterus noted. Ears:  Normal auditory acuity. Mouth:  No deformity or lesions.   Lungs:  Clear throughout to auscultation.  No wheezes, crackles, or rhonchi.  Heart:  Regular rate and rhythm; no murmurs, clicks, rubs, or gallops. Abdomen:  Soft, non-distended.  BS present.  TTP in RUQ and along right flank.  Rectal:  Deferred  Msk:  Symmetrical without gross deformities. Pulses:  Normal pulses noted. Extremities:  Without  clubbing or edema. Neurologic:  Alert and oriented x 4;  grossly normal neurologically. Skin:  Intact without significant lesions or rashes. Psych:  Alert and cooperative. Normal mood and affect.  Lab Results: Recent Labs    01/20/20 0903 01/22/20 1825 01/23/20 0653  WBC 11.0* 10.6* 9.7  HGB 13.0 11.9* 11.6*  HCT 39.4 36.9* 36.3*  PLT 807* 664* 688*   BMET Recent Labs    01/20/20 0903 01/20/20 0903 01/22/20 0921 01/22/20 1825 01/23/20 0653  NA 129*  --  127*  --  126*  K 4.1  --  4.9  --  4.4  CL 96*  --  96*  --  94*  CO2 22  --  21*  --  22  GLUCOSE 85  --  128*  --  189*  BUN 16  --  30*  --  29*  CREATININE 1.18   < > 1.49* 1.33* 1.00  CALCIUM 8.9  --  9.0  --  8.1*   < > = values in this interval not displayed.   LFT Recent Labs    01/23/20 0653  PROT 6.5  ALBUMIN 2.3*  AST 576*  ALT 312*  ALKPHOS 871*  BILITOT 9.5*   Studies/Results: CT ABDOMEN PELVIS WO CONTRAST  Result Date: 01/22/2020 CLINICAL DATA:  69 year old male with right lower quadrant abdominal pain. EXAM: CT ABDOMEN AND PELVIS WITHOUT CONTRAST TECHNIQUE: Multidetector CT imaging of the abdomen and pelvis was performed following the standard protocol without IV contrast. COMPARISON:  CT abdomen pelvis date 01/07/2020. FINDINGS: Evaluation of this exam is limited in the absence of intravenous contrast. Lower chest: The visualized lung bases are clear. There is coronary vascular calcification. No intra-abdominal free air. Trace perihepatic ascites. Hepatobiliary: Morphologic changes of cirrhosis. The liver is enlarged measuring approximately 19 cm in midclavicular length. No intrahepatic biliary dilatation. High attenuating content within gallbladder may represent sludge or vicarious excretion of intravenous contrast from recent administration. No pericholecystic fluid or evidence of acute cholecystitis by CT. Pancreas: Unremarkable. No pancreatic ductal dilatation or surrounding inflammatory changes.  Spleen: Normal in size without focal abnormality. Adrenals/Urinary Tract: The adrenal glands unremarkable. The kidneys, visualized ureters, and urinary bladder appear unremarkable. Stomach/Bowel: Small hiatal hernia. There is moderate stool throughout the colon. There is no bowel obstruction or active inflammation. The appendix is normal. Vascular/Lymphatic: Advanced aortoiliac atherosclerotic disease. The IVC is unremarkable. No portal venous gas. There is no adenopathy. Reproductive: Prostate and seminal vesicles are grossly unremarkable. No pelvic mass. Other: There is a 2 x 3 cm intramuscular lipoma along the right posterior flank. Musculoskeletal: Mild degenerative changes of the spine. No acute osseous pathology. Areas of sclerotic changes involving the right femoral head may represent early avascular necrosis. No cortical collapse or fragmentation. IMPRESSION: 1. No acute intra-abdominal or pelvic pathology. No bowel obstruction. Normal appendix. 2. Cirrhosis with trace perihepatic ascites. 3. Aortic Atherosclerosis (ICD10-I70.0). Electronically Signed   By: Anner Crete M.D.   On: 01/22/2020 20:35   IMPRESSION:  *69 year old male with known cirrhosis and HCC, under the care of Dr. Benay Spice and was undergoing treatment until at least recently.  Now with significant right sided abdominal/flank pain and chest pain.  Also has significant increase in LFTs as compared to 12 days ago.  Oncology suspected due to cancer progression.  CT showed no other source of pain including no dilated bile ducts. *Hyponatremia:  Na+ 126 this AM. *Small volume rectal bleeding:  Saw small streaks with BM x 2 this AM.  Hgb is stable.  PLAN: -Discussed with hospitalist.  They have ordered an MRCP/MRI abdomen.  We will follow up those results. -Pain control per hospitalists. -Monitor labs including Hgb, electrolytes, LFTs, etc.   Laban Emperor. Zehr  01/23/2020, 8:59 AM  GI ATTENDING  History, laboratories, x-rays  reviewed.  Just completed MRI/MRCP reviewed.  Patient seen and examined.  Agree with comprehensive consultation note as outlined above.  Unfortunately, the MRI shows extensive progression of the patient's hepatocellular carcinoma.  No biliary obstruction.  This is resulting in liver failure and pain.  Recommend aggressive palliative care at this point.  Please inform oncology of this follow-up information.  No role for GI medicine.  We will sign off.  Docia Chuck. Geri Seminole., M.D. [facility] Division of Gastroenterology

## 2020-01-24 DIAGNOSIS — R1011 Right upper quadrant pain: Secondary | ICD-10-CM | POA: Diagnosis not present

## 2020-01-24 LAB — COMPREHENSIVE METABOLIC PANEL
ALT: 259 U/L — ABNORMAL HIGH (ref 0–44)
AST: 454 U/L — ABNORMAL HIGH (ref 15–41)
Albumin: 2.2 g/dL — ABNORMAL LOW (ref 3.5–5.0)
Alkaline Phosphatase: 821 U/L — ABNORMAL HIGH (ref 38–126)
Anion gap: 10 (ref 5–15)
BUN: 13 mg/dL (ref 8–23)
CO2: 21 mmol/L — ABNORMAL LOW (ref 22–32)
Calcium: 8 mg/dL — ABNORMAL LOW (ref 8.9–10.3)
Chloride: 96 mmol/L — ABNORMAL LOW (ref 98–111)
Creatinine, Ser: 0.68 mg/dL (ref 0.61–1.24)
GFR, Estimated: >60 mL/min (ref >60)
Glucose, Bld: 97 mg/dL (ref 70–99)
Potassium: 4.7 mmol/L (ref 3.5–5.1)
Sodium: 127 mmol/L — ABNORMAL LOW (ref 135–145)
Total Bilirubin: 11.1 mg/dL — ABNORMAL HIGH (ref 0.3–1.2)
Total Protein: 6.7 g/dL (ref 6.5–8.1)

## 2020-01-24 LAB — GLUCOSE, CAPILLARY
Glucose-Capillary: 94 mg/dL (ref 70–99)
Glucose-Capillary: 186 mg/dL — ABNORMAL HIGH (ref 70–99)
Glucose-Capillary: 124 mg/dL — ABNORMAL HIGH (ref 70–99)
Glucose-Capillary: 160 mg/dL — ABNORMAL HIGH (ref 70–99)

## 2020-01-24 MED ORDER — BETAMETHASONE VALERATE 0.1 % EX LOTN
TOPICAL_LOTION | Freq: Every day | CUTANEOUS | Status: DC
  Filled 2020-01-24: qty 60

## 2020-01-24 MED ORDER — ENSURE ENLIVE PO LIQD
237.0000 mL | Freq: Two times a day (BID) | ORAL | Status: DC
  Administered 2020-01-25 – 2020-01-26 (×3): 237 mL via ORAL

## 2020-01-24 NOTE — Progress Notes (Signed)
PROGRESS NOTE    [name]  JTT:[id number] DOB: 1950/07/27 DOA: 01/22/2020 PCP: Hoyt Koch, MD   Chief Complain: Abdominal pain  Brief Narrative: Patient is a 69 year old male with history of hepatocellular carcinoma with cirrhosis who presented to the emergency department complaints of  abdominal pain.  He was recently admitted and discharged from here after being managed for severe sepsis due to Enterococcus UTI.[name]  Patient stated that he had worsening right upper quadrant pain  after discharge.  He was directed  by his oncologist Dr. Learta Codding to be admitted.  Patient found to have elevated liver enzymes on presentation.  MRI of the abdomen showed advanced hepatocellular carcinoma.  Assessment & Plan:   Active Problems:   Abdominal pain  Right upper quadrant pain: Complaint of worsening abdominal pain after discharge from here.  Found to have liver enzymes elevation from before.  CT abdomen/pelvis not show any acute findings but showed chronic changes with cirrhosis.  MRI showed severe progression of his hepatocellular carcinoma but no intra-/extra hepatic bile duct dilation. Continue pain management, supportive care  Elevated liver enzymes/jaundice: Secondary to progressive HCC.  Liver enzymes improving  Right upper back pain: Patient has been complaining of severe right upper back pain not responding to analgesics.  We added muscle relaxants on the  list.  Pain could be from chronic degenerative changes in the spine.  MRCP did not show any obvious  osseous lesions  Hepatocellular carcinoma/cirrhosis: Follows with oncology, Dr. Learta Codding.  He is hepatocellular carcinoma has significantly progressed.  I will discuss with Dr. Learta Codding.  Hospice is the best option for him  AKI/hyponatremia: AKI cud be from dehydration.Improved with   IV fluids.  He has chronic hyponatremia most likely associated with cirrhosis.  Hypertension: Home lisinopril on hold.  Hydrochlorothiazide also on  hold.  Continue to monitor blood pressure  COPD: Continue bronchodilators as needed.  Hyperlipidemia: Statin stopped due to elevated liver enzymes  GERD: Continue Protonix  Diabetes type 2: Continue sliding-scale insulin.  Monitor blood sugars  Goals of care: Patient with hepatocellular carcinoma with severe progression of the disease.  Palliative care has been consulted.  I do not think there is any option for him besides palliative/hospice approach.           DVT prophylaxis:Lovenox Code Status:  Family Communication: None at the bedside Status is: Inpatient  Remains inpatient appropriate because:Inpatient level of care appropriate due to severity of illness   Dispo: The patient is from: Home              Anticipated d/c is to: Home               Anticipated d/c date is: 2 days              Patient currently is not medically stable to d/c.     Consultants: GI  Procedures:None  Antimicrobials:  Anti-infectives (From admission, onward)   None      Subjective:  Patient seen and examined at the bedside this morning.  Hemodynamically stable.  Comfortable today as his back pain and abdominal pain have improved.  Has severe distention of the abdomen but feels better today.  Objective: Vitals:   01/23/20 1826 01/23/20 2125 01/24/20 0647 01/24/20 0731  BP:  134/68 130/63   Pulse:  86 80   Resp:  16 16   Temp:  97.8 F (36.6 C) 98.2 F (36.8 C)   TempSrc:  Oral Oral   SpO2: 97% 97%  98% 100%  Weight:      Height:        Intake/Output Summary (Last 24 hours) at 01/24/2020 0806 Last data filed at 01/24/2020 0700 Gross per 24 hour  Intake 3174.31 ml  Output 200 ml  Net 2974.31 ml   Filed Weights   01/22/20 1523  Weight: 83.9 kg    Examination:   General exam: comfortable HEENT:PERRL,Oral mucosa moist, Ear/Nose normal on gross exam Respiratory system: Bilateral equal air entry, normal vesicular breath sounds, no wheezes or crackles  Cardiovascular  system: S1 & S2 heard, RRR. No JVD, murmurs, rubs, gallops or clicks. Gastrointestinal system: Abdomen is distended,litle tenset and nontender. No organomegaly or masses felt. Normal bowel sounds heard. Central nervous system: Alert and oriented. No focal neurological deficits. Extremities: No edema, no clubbing ,no cyanosis Skin: No rashes, lesions or ulcers ,no pallor.Icterus present     Data Reviewed: I have personally reviewed following labs and imaging studies  CBC: Recent Labs  Lab 01/20/20 0903 01/22/20 1825 01/23/20 0653  WBC 11.0* 10.6* 9.7  NEUTROABS 8.4*  --   --   HGB 13.0 11.9* 11.6*  HCT 39.4 36.9* 36.3*  MCV 79.8* 81.8 82.5  PLT 807* 664* 326*   Basic Metabolic Panel: Recent Labs  Lab 01/20/20 0903 01/22/20 0921 01/22/20 1825 01/23/20 0653 01/24/20 0623  NA 129* 127*  --  126* 127*  K 4.1 4.9  --  4.4 4.7  CL 96* 96*  --  94* 96*  CO2 22 21*  --  22 21*  GLUCOSE 85 128*  --  189* 97  BUN 16 30*  --  29* 13  CREATININE 1.18 1.49* 1.33* 1.00 0.68  CALCIUM 8.9 9.0  --  8.1* 8.0*   GFR: Estimated Creatinine Clearance: 86.4 mL/min (by C-G formula based on SCr of 0.68 mg/dL). Liver Function Tests: Recent Labs  Lab 01/20/20 0903 01/22/20 0921 01/23/20 0653 01/24/20 0623  AST 772* 854* 576* 454*  ALT 478* 453* 312* 259*  ALKPHOS 995* 1,078* 871* 821*  BILITOT 9.1* 10.1* 9.5* 11.1*  PROT 7.8 7.6 6.5 6.7  ALBUMIN 2.3* 2.2* 2.3* 2.2*   No results for input(s): LIPASE, AMYLASE in the last 168 hours. No results for input(s): AMMONIA in the last 168 hours. Coagulation Profile: No results for input(s): INR, PROTIME in the last 168 hours. Cardiac Enzymes: No results for input(s): CKTOTAL, CKMB, CKMBINDEX, TROPONINI in the last 168 hours. BNP (last 3 results) No results for input(s): PROBNP in the last 8760 hours. HbA1C: No results for input(s): HGBA1C in the last 72 hours. CBG: Recent Labs  Lab 01/23/20 0744 01/23/20 1236 01/23/20 1726  01/23/20 2122 01/24/20 0734  GLUCAP 164* 155* 161* 186* 94   Lipid Profile: No results for input(s): CHOL, HDL, LDLCALC, TRIG, CHOLHDL, LDLDIRECT in the last 72 hours. Thyroid Function Tests: No results for input(s): TSH, T4TOTAL, FREET4, T3FREE, THYROIDAB in the last 72 hours. Anemia Panel: No results for input(s): VITAMINB12, FOLATE, FERRITIN, TIBC, IRON, RETICCTPCT in the last 72 hours. Sepsis Labs: No results for input(s): PROCALCITON, LATICACIDVEN in the last 168 hours.  Recent Results (from the past 240 hour(s))  SARS Coronavirus 2 (TAT 6-24 hrs)     Status: None   Collection Time: 01/22/20  2:22 PM   Specimen: Nasopharyngeal Swab  Result Value Ref Range Status   SARS Coronavirus 2 NEGATIVE NEGATIVE Final    Comment: (NOTE) SARS-CoV-2 target nucleic acids are NOT DETECTED.  The SARS-CoV-2 RNA is generally detectable in  upper and lower respiratory specimens during the acute phase of infection. Negative results do not preclude SARS-CoV-2 infection, do not rule out co-infections with other pathogens, and should not be used as the sole basis for treatment or other patient management decisions. Negative results must be combined with clinical observations, patient history, and epidemiological information. The expected result is Negative.  Fact Sheet for Patients: SugarRoll.be  Fact Sheet for Healthcare Providers: https://www.woods-mathews.com/  This test is not yet approved or cleared by the [place] FDA and  has been authorized for detection and/or diagnosis of SARS-CoV-2 by FDA under an Emergency Use Authorization (EUA). This EUA will remain  in effect (meaning this test can be used) for the duration of the COVID-19 declaration under Se ction 564(b)(1) of the Act, 21 U.S.C. section 360bbb-3(b)(1), unless the authorization is terminated or revoked sooner.  Performed at [facility], [place] [address] [place],  [place] [address]          Radiology Studies: CT ABDOMEN PELVIS WO CONTRAST  Result Date: 01/22/2020 CLINICAL DATA:  69 year old male with right lower quadrant abdominal pain. EXAM: CT ABDOMEN AND PELVIS WITHOUT CONTRAST TECHNIQUE: Multidetector CT imaging of the abdomen and pelvis was performed following the standard protocol without IV contrast. COMPARISON:  CT abdomen pelvis date 01/07/2020. FINDINGS: Evaluation of this exam is limited in the absence of intravenous contrast. Lower chest: The visualized lung bases are clear. There is coronary vascular calcification. No intra-abdominal free air. Trace perihepatic ascites. Hepatobiliary: Morphologic changes of cirrhosis. The liver is enlarged measuring approximately 19 cm in midclavicular length. No intrahepatic biliary dilatation. High attenuating content within gallbladder may represent sludge or vicarious excretion of intravenous contrast from recent administration. No pericholecystic fluid or evidence of acute cholecystitis by CT. Pancreas: Unremarkable. No pancreatic ductal dilatation or surrounding inflammatory changes. Spleen: Normal in size without focal abnormality. Adrenals/Urinary Tract: The adrenal glands unremarkable. The kidneys, visualized ureters, and urinary bladder appear unremarkable. Stomach/Bowel: Small hiatal hernia. There is moderate stool throughout the colon. There is no bowel obstruction or active inflammation. The appendix is normal. Vascular/Lymphatic: Advanced aortoiliac atherosclerotic disease. The IVC is unremarkable. No portal venous gas. There is no adenopathy. Reproductive: Prostate and seminal vesicles are grossly unremarkable. No pelvic mass. Other: There is a 2 x 3 cm intramuscular lipoma along the right posterior flank. Musculoskeletal: Mild degenerative changes of the spine. No acute osseous pathology. Areas of sclerotic changes involving the right femoral head may represent early avascular necrosis. No cortical collapse or  fragmentation. IMPRESSION: 1. No acute intra-abdominal or pelvic pathology. No bowel obstruction. Normal appendix. 2. Cirrhosis with trace perihepatic ascites. 3. Aortic Atherosclerosis (ICD10-I70.0). Electronically Signed   By: Anner Crete M.D.   On: 01/22/2020 20:35   MR 3D Recon At Scanner  Result Date: 01/23/2020 CLINICAL DATA:  Jaundice. Hepatocellular carcinoma. Bilirubin equal 9.5. EXAM: MRI ABDOMEN WITHOUT AND WITH CONTRAST (INCLUDING MRCP) TECHNIQUE: Multiplanar multisequence MR imaging of the abdomen was performed both before and after the administration of intravenous contrast. Heavily T2-weighted images of the biliary and pancreatic ducts were obtained, and three-dimensional MRCP images were rendered by post processing. CONTRAST:  68mL GADAVIST GADOBUTROL 1 MMOL/ML IV SOLN COMPARISON:  MRI 10/27/2019, CT 01/22/2020 FINDINGS: Lower chest:  Lung bases are clear. Hepatobiliary: Liver has expanded in volume significantly from MRI 10/27/2019. Liver measures 23 cm in craniocaudad dimension (image 32/series 30) compared to 16 mm cm on 10/27/2019. Liver measures 19 cm in axial dimension (anterior-posterior) through a dominant RIGHT hepatic  lobe lesion compared to 15 cm on comparison MRI at same level. Again demonstrated multiple round lesions throughout the liver which are near confluent in nature. In total the masses have increased significantly in size now occupy the entirety of the liver parenchyma (T2 weighted imaging, series 6). One dominant larger round lesion in the RIGHT hepatic lobe is similar measuring 3.8 cm unchanged from prior. There is no intrahepatic biliary duct dilatation. Common bile duct is normal caliber. Pancreas: Normal pancreatic parenchymal intensity. No ductal dilatation or inflammation. Spleen: Normal spleen. Adrenals/urinary tract: Adrenal glands and kidneys are normal. Stomach/Bowel: Stomach and limited of the small bowel is unremarkable Vascular/Lymphatic: Abdominal aortic  normal caliber. No retroperitoneal periportal lymphadenopathy. Musculoskeletal: No aggressive osseous lesion IMPRESSION: 1. Marked interval enlargement of the liver with diffuse infiltrative hepatocellular carcinoma. 2. Multiple round lesions now occupy the near entirety of the liver parenchyma consistent with progression of disease from 10/27/2019. 3. No intrahepatic or extrahepatic biliary duct dilatation. 4. Normal pancreas. These results will be called to the ordering clinician or representative by the Radiologist Assistant, and communication documented in the PACS or [employer]. Electronically Signed   By: Suzy Bouchard M.D.   On: 01/23/2020 15:20   MR ABDOMEN MRCP W WO CONTAST  Result Date: 01/23/2020 CLINICAL DATA:  Jaundice. Hepatocellular carcinoma. Bilirubin equal 9.5. EXAM: MRI ABDOMEN WITHOUT AND WITH CONTRAST (INCLUDING MRCP) TECHNIQUE: Multiplanar multisequence MR imaging of the abdomen was performed both before and after the administration of intravenous contrast. Heavily T2-weighted images of the biliary and pancreatic ducts were obtained, and three-dimensional MRCP images were rendered by post processing. CONTRAST:  19mL GADAVIST GADOBUTROL 1 MMOL/ML IV SOLN COMPARISON:  MRI 10/27/2019, CT 01/22/2020 FINDINGS: Lower chest:  Lung bases are clear. Hepatobiliary: Liver has expanded in volume significantly from MRI 10/27/2019. Liver measures 23 cm in craniocaudad dimension (image 32/series 30) compared to 16 mm cm on 10/27/2019. Liver measures 19 cm in axial dimension (anterior-posterior) through a dominant RIGHT hepatic lobe lesion compared to 15 cm on comparison MRI at same level. Again demonstrated multiple round lesions throughout the liver which are near confluent in nature. In total the masses have increased significantly in size now occupy the entirety of the liver parenchyma (T2 weighted imaging, series 6). One dominant larger round lesion in the RIGHT hepatic lobe is similar  measuring 3.8 cm unchanged from prior. There is no intrahepatic biliary duct dilatation. Common bile duct is normal caliber. Pancreas: Normal pancreatic parenchymal intensity. No ductal dilatation or inflammation. Spleen: Normal spleen. Adrenals/urinary tract: Adrenal glands and kidneys are normal. Stomach/Bowel: Stomach and limited of the small bowel is unremarkable Vascular/Lymphatic: Abdominal aortic normal caliber. No retroperitoneal periportal lymphadenopathy. Musculoskeletal: No aggressive osseous lesion IMPRESSION: 1. Marked interval enlargement of the liver with diffuse infiltrative hepatocellular carcinoma. 2. Multiple round lesions now occupy the near entirety of the liver parenchyma consistent with progression of disease from 10/27/2019. 3. No intrahepatic or extrahepatic biliary duct dilatation. 4. Normal pancreas. These results will be called to the ordering clinician or representative by the Radiologist Assistant, and communication documented in the PACS or [employer]. Electronically Signed   By: Suzy Bouchard M.D.   On: 01/23/2020 15:20        Scheduled Meds: . aspirin EC  81 mg Oral Daily  . enoxaparin (LOVENOX) injection  40 mg Subcutaneous Q24H  . insulin aspart  0-15 Units Subcutaneous TID WC  . insulin aspart  0-5 Units Subcutaneous QHS  . latanoprost  1 drop Both Eyes  QHS  . mometasone-formoterol  2 puff Inhalation BID   Continuous Infusions: . sodium chloride 75 mL/hr at 01/23/20 1958  . methocarbamol (ROBAXIN) IV 500 mg (01/24/20 0034)     LOS: 2 days    Time spent:35 mins.More than 50% of that time was spent in counseling and/or coordination of care.      Shelly Coss, MD [facility] P11/21/2021, 8:06 AM

## 2020-01-24 NOTE — Progress Notes (Signed)
Initial Nutrition Assessment  RD working remotely.  DOCUMENTATION CODES:   Obesity unspecified  INTERVENTION:  Provide Ensure Plus po BID, each supplement provides 350 kcal and 16 grams of protein.  Pt  would benefit from nutrient dense supplement. Given pt's hx of DM, RD will reassess adequacy of PO intake, CBGS, and adjust supplement regimen as appropriate at follow-up.   NUTRITION DIAGNOSIS:   Increased nutrient needs related to catabolic illness ([place], COPD, CHF) as evidenced by estimated needs.  GOAL:   Patient will meet greater than or equal to 90% of their needs  MONITOR:   PO intake, Supplement acceptance, Labs, Weight trends, I & O's  REASON FOR ASSESSMENT:   Malnutrition Screening Tool    ASSESSMENT:   68 year old male with PMHx of HTN, CAD, COPD, asthma, DM, hepatitis C, arthritis, CHF, chronic bronchitis, cirrhosis, HCC most recently on reduced dose of Lenvatinib started 12/01/2019 admitted with right upper quadrant pain, elevated liver enzymes, right upper back pain, AKI.   11/20 s/p MRCP/MRI abdomen showing progression of disease  Spoke with patient over the phone. He reports decreased appetite and intake for 1.5 weeks prior to admission. He was still forcing himself to eat. Unable to provide specific details on intake. Patient reports he is tolerating clear liquids well. Diet has now been advanced to regular. Patient reports he drinks oral nutrition supplements regularly to help meet calorie/protein needs and is amenable to drinking them here as well.   Patient reports his UBW was 198 lbs and that he has lost down to around 160 lbs. Per chart patient patient was 92.9 kg on 01/12/2020 and he is currently documented to be 83.9 kg (184.97 lbs). If accurate that is a weight loss of 9 kg (9.7% body weight) over 2 weeks, which would be significant for time frame. Will continue to monitor weight trend.  Medications reviewed and include: Novolog 0-15 units TID, Novolog 0-5  units QHS, NS at 75 mL/hr.  Labs reviewed: CBG 94-186, Sodium 127, Chloride 96, CO2 21.  Unable to determine if patient meets criteria for malnutrition at this time.  NUTRITION - FOCUSED PHYSICAL EXAM:  Unable to complete at this time as RD is working remotely.  Diet Order:   Diet Order            Diet regular Room service appropriate? Yes; Fluid consistency: Thin  Diet effective now                EDUCATION NEEDS:   No education needs have been identified at this time  Skin:  Skin Assessment: Reviewed RN Assessment  Last BM:  01/23/2020 per chart  Height:   Ht Readings from Last 1 Encounters:  01/22/20 5\' 4"  (1.626 m)   Weight:   Wt Readings from Last 1 Encounters:  01/22/20 83.9 kg   BMI:  Body mass index is 31.75 kg/m.  Estimated Nutritional Needs:   Kcal:  2100-2300  Protein:  105-115 grams  Fluid:  2 L/day  Jacklynn Barnacle, MS, RD, LDN Pager number available on Amion

## 2020-01-25 ENCOUNTER — Telehealth: Payer: Self-pay | Admitting: Nurse Practitioner

## 2020-01-25 ENCOUNTER — Ambulatory Visit: Payer: Medicare HMO | Admitting: Endocrinology

## 2020-01-25 DIAGNOSIS — N179 Acute kidney failure, unspecified: Secondary | ICD-10-CM

## 2020-01-25 DIAGNOSIS — R7401 Elevation of levels of liver transaminase levels: Secondary | ICD-10-CM

## 2020-01-25 DIAGNOSIS — R109 Unspecified abdominal pain: Secondary | ICD-10-CM | POA: Diagnosis not present

## 2020-01-25 DIAGNOSIS — E119 Type 2 diabetes mellitus without complications: Secondary | ICD-10-CM

## 2020-01-25 DIAGNOSIS — R1011 Right upper quadrant pain: Secondary | ICD-10-CM | POA: Diagnosis not present

## 2020-01-25 LAB — COMPREHENSIVE METABOLIC PANEL
ALT: 240 U/L — ABNORMAL HIGH (ref 0–44)
AST: 447 U/L — ABNORMAL HIGH (ref 15–41)
Albumin: 2.3 g/dL — ABNORMAL LOW (ref 3.5–5.0)
Alkaline Phosphatase: 808 U/L — ABNORMAL HIGH (ref 38–126)
Anion gap: 11 (ref 5–15)
BUN: 12 mg/dL (ref 8–23)
CO2: 19 mmol/L — ABNORMAL LOW (ref 22–32)
Calcium: 8.2 mg/dL — ABNORMAL LOW (ref 8.9–10.3)
Chloride: 96 mmol/L — ABNORMAL LOW (ref 98–111)
Creatinine, Ser: 0.68 mg/dL (ref 0.61–1.24)
GFR, Estimated: >60 mL/min (ref >60)
Glucose, Bld: 87 mg/dL (ref 70–99)
Potassium: 4.7 mmol/L (ref 3.5–5.1)
Sodium: 126 mmol/L — ABNORMAL LOW (ref 135–145)
Total Bilirubin: 13.6 mg/dL — ABNORMAL HIGH (ref 0.3–1.2)
Total Protein: 6.9 g/dL (ref 6.5–8.1)

## 2020-01-25 LAB — GLUCOSE, CAPILLARY
Glucose-Capillary: 82 mg/dL (ref 70–99)
Glucose-Capillary: 78 mg/dL (ref 70–99)
Glucose-Capillary: 88 mg/dL (ref 70–99)
Glucose-Capillary: 93 mg/dL (ref 70–99)

## 2020-01-25 MED ORDER — PROMETHAZINE HCL 25 MG/ML IJ SOLN
12.5000 mg | Freq: Four times a day (QID) | INTRAMUSCULAR | Status: DC | PRN
  Administered 2020-01-25: 12.5 mg via INTRAVENOUS
  Filled 2020-01-25: qty 1

## 2020-01-25 MED ORDER — URSODIOL 300 MG PO CAPS
300.0000 mg | ORAL_CAPSULE | Freq: Two times a day (BID) | ORAL | Status: DC
  Administered 2020-01-25 – 2020-01-26 (×3): 300 mg via ORAL
  Filled 2020-01-25 (×3): qty 1

## 2020-01-25 MED ORDER — DIPHENHYDRAMINE-ZINC ACETATE 2-0.1 % EX CREA
TOPICAL_CREAM | Freq: Three times a day (TID) | CUTANEOUS | Status: DC
  Administered 2020-01-25 (×2): 1 via TOPICAL
  Filled 2020-01-25: qty 28

## 2020-01-25 MED ORDER — DIPHENHYDRAMINE HCL 50 MG/ML IJ SOLN
25.0000 mg | Freq: Once | INTRAMUSCULAR | Status: AC
  Administered 2020-01-25: 25 mg via INTRAVENOUS
  Filled 2020-01-25: qty 1

## 2020-01-25 MED ORDER — DIPHENHYDRAMINE-ZINC ACETATE 2-0.1 % EX CREA: TOPICAL_CREAM | Freq: Three times a day (TID) | CUTANEOUS | Status: DC | PRN

## 2020-01-25 MED ORDER — HYDROMORPHONE HCL 1 MG/ML IJ SOLN
0.5000 mg | INTRAMUSCULAR | Status: DC | PRN
  Administered 2020-01-25 (×2): 0.5 mg via INTRAVENOUS
  Filled 2020-01-25 (×2): qty 0.5

## 2020-01-25 MED ORDER — FUROSEMIDE 20 MG PO TABS
20.0000 mg | ORAL_TABLET | Freq: Every day | ORAL | Status: DC
  Administered 2020-01-25 – 2020-01-26 (×2): 20 mg via ORAL
  Filled 2020-01-25 (×2): qty 1

## 2020-01-25 MED ORDER — PROMETHAZINE HCL 25 MG/ML IJ SOLN: 6.2500 mg | Freq: Four times a day (QID) | INTRAMUSCULAR | Status: DC | PRN

## 2020-01-25 NOTE — Progress Notes (Signed)
PROGRESS NOTE    [name]  GYJ:[id number] DOB: August 20, 1950 DOA: 01/22/2020 PCP: Hoyt Koch, MD   Chief Complain: Abdominal pain  Brief Narrative: Patient is a unfortunate 69 year old male with history of hepatocellular carcinoma with cirrhosis who presented to the emergency department complaints of  abdominal pain.  He was recently admitted and discharged from here after being managed for severe sepsis due to Enterococcus UTI.[name]  Patient stated that he had worsening right upper quadrant pain  after discharge.  He was directed  by his oncologist Dr. Learta Codding to be admitted.  Patient found to have elevated liver enzymes on presentation.  MRI of the abdomen showed advanced hepatocellular carcinoma.  Now we are discussing goals of care, and referred patient for hospice services .  Patient discussing with family before making final decision.  Palliative care also involved  Assessment & Plan:   Active Problems:   Abdominal pain  Right upper quadrant pain: Complaint of worsening abdominal pain after discharge from here.  Found to have liver enzymes elevation from before.  CT abdomen/pelvis not show any acute findings but showed chronic changes with cirrhosis.  MRI showed severe progression of his hepatocellular carcinoma but no intra-/extra hepatic bile duct dilation. Continue pain management, supportive care  Elevated liver enzymes/jaundice/itching: Secondary to progressive HCC.  Complains of severe itching and he has scratches on his skin.  We have started him on Benadryl cream, hydroxyzine.  Also started on ursodiol for elevated bilirubin levels.  Right upper back pain: Patient has been complaining of severe right upper back pain not responding to analgesics.  We added muscle relaxants on the  list. Back Pain could be from chronic degenerative changes in the spine.  MRCP did not show any obvious  osseous lesions.  Continue current pain regimen, supportive care  Hepatocellular  carcinoma/cirrhosis: Follows with oncology, Dr. Learta Codding.  He is hepatocellular carcinoma has significantly progressed.  Oncology discussed with the patient and provided referral for hospice.  Patient will discuss with his family before making final decision.  He is still full code  AKI/hyponatremia: AKI cud be from dehydration.resolved  with   IV fluids.  He has chronic hyponatremia most likely associated with cirrhosis.  He was also on hydrochlorothiazide at home which has been discontinued.  Started on low-dose Lasix which can help with hyponatremia.  Hypertension: Home lisinopril/ Hydrochlorothiazideon hold.  Continue to monitor blood pressure.  Blood pressure stable without any medications  COPD: Continue bronchodilators as needed.  Not on home oxygen  Hyperlipidemia: Statin stopped due to elevated liver enzymes  GERD: Continue Protonix  Diabetes type 2: Continue sliding-scale insulin.  Monitor blood sugars  Goals of care: Patient with hepatocellular carcinoma with severe progression of the disease.  Palliative care has been consulted.  I do not think there is any option for him besides palliative/hospice approach.  I discussed goals of care and CODE STATUS at the bedside.  He wants to talk to his sister before making final decision.    Nutrition Problem: Increased nutrient needs Etiology: catabolic illness (Ursa, COPD, CHF)      DVT prophylaxis:Lovenox Code Status:  Family Communication: None at the bedside Status is: Inpatient  Remains inpatient appropriate because:Inpatient level of care appropriate due to severity of illness   Dispo: The patient is from: Home              Anticipated d/c is to: Home with hospice vs residential hospice  Anticipated d/c date is: 2 days              Patient currently is not medically stable to d/c.     Consultants: GI  Procedures:None  Antimicrobials:  Anti-infectives (From admission, onward)   None       Subjective:  Patient seen and examined at the bedside this morning.  Overall comfortable.  He feels better than yesterday.  Back pain and right flank pain are under control.  We discussed about goals of care, CODE STATUS, concept of  hospice at bedside.  Objective: Vitals:   01/24/20 1936 01/24/20 2021 01/25/20 0610 01/25/20 0728  BP:  126/65 137/65   Pulse:  93 85   Resp:  16 16   Temp:  98.4 F (36.9 C) 97.7 F (36.5 C)   TempSrc:  Oral Oral   SpO2: 97% 97% 97% 98%  Weight:      Height:        Intake/Output Summary (Last 24 hours) at 01/25/2020 0802 Last data filed at 01/24/2020 1904 Gross per 24 hour  Intake 1160 ml  Output --  Net 1160 ml   Filed Weights   01/22/20 1523  Weight: 83.9 kg    Examination:   General exam: Overall comfortable  HEENT:PERRL,Oral mucosa moist, Ear/Nose normal on gross exam Respiratory system: Bilateral equal air entry, normal vesicular breath sounds, no wheezes or crackles  Cardiovascular system: S1 & S2 heard, RRR. No JVD, murmurs, rubs, gallops or clicks. Gastrointestinal system: Abdomen is distended, soft and nontender. No organomegaly or masses felt. Normal bowel sounds heard. Central nervous system: Alert and oriented. No focal neurological deficits. Extremities: No edema, no clubbing ,no cyanosis Skin: Skin excoriations from itching, icterus     Data Reviewed: I have personally reviewed following labs and imaging studies  CBC: Recent Labs  Lab 01/20/20 0903 01/22/20 1825 01/23/20 0653  WBC 11.0* 10.6* 9.7  NEUTROABS 8.4*  --   --   HGB 13.0 11.9* 11.6*  HCT 39.4 36.9* 36.3*  MCV 79.8* 81.8 82.5  PLT 807* 664* 742*   Basic Metabolic Panel: Recent Labs  Lab 01/20/20 0903 01/20/20 0903 01/22/20 0921 01/22/20 1825 01/23/20 0653 01/24/20 0623 01/25/20 0547  NA 129*  --  127*  --  126* 127* 126*  K 4.1  --  4.9  --  4.4 4.7 4.7  CL 96*  --  96*  --  94* 96* 96*  CO2 22  --  21*  --  22 21* 19*  GLUCOSE 85   --  128*  --  189* 97 87  BUN 16  --  30*  --  29* 13 12  CREATININE 1.18   < > 1.49* 1.33* 1.00 0.68 0.68  CALCIUM 8.9  --  9.0  --  8.1* 8.0* 8.2*   < > = values in this interval not displayed.   GFR: Estimated Creatinine Clearance: 86.4 mL/min (by C-G formula based on SCr of 0.68 mg/dL). Liver Function Tests: Recent Labs  Lab 01/20/20 0903 01/22/20 0921 01/23/20 0653 01/24/20 0623 01/25/20 0547  AST 772* 854* 576* 454* 447*  ALT 478* 453* 312* 259* 240*  ALKPHOS 995* 1,078* 871* 821* 808*  BILITOT 9.1* 10.1* 9.5* 11.1* 13.6*  PROT 7.8 7.6 6.5 6.7 6.9  ALBUMIN 2.3* 2.2* 2.3* 2.2* 2.3*   No results for input(s): LIPASE, AMYLASE in the last 168 hours. No results for input(s): AMMONIA in the last 168 hours. Coagulation Profile: No results for input(s): INR,  PROTIME in the last 168 hours. Cardiac Enzymes: No results for input(s): CKTOTAL, CKMB, CKMBINDEX, TROPONINI in the last 168 hours. BNP (last 3 results) No results for input(s): PROBNP in the last 8760 hours. HbA1C: No results for input(s): HGBA1C in the last 72 hours. CBG: Recent Labs  Lab 01/24/20 0734 01/24/20 1232 01/24/20 1710 01/24/20 2019 01/25/20 0743  GLUCAP 94 186* 124* 160* 82   Lipid Profile: No results for input(s): CHOL, HDL, LDLCALC, TRIG, CHOLHDL, LDLDIRECT in the last 72 hours. Thyroid Function Tests: No results for input(s): TSH, T4TOTAL, FREET4, T3FREE, THYROIDAB in the last 72 hours. Anemia Panel: No results for input(s): VITAMINB12, FOLATE, FERRITIN, TIBC, IRON, RETICCTPCT in the last 72 hours. Sepsis Labs: No results for input(s): PROCALCITON, LATICACIDVEN in the last 168 hours.  Recent Results (from the past 240 hour(s))  SARS Coronavirus 2 (TAT 6-24 hrs)     Status: None   Collection Time: 01/22/20  2:22 PM   Specimen: Nasopharyngeal Swab  Result Value Ref Range Status   SARS Coronavirus 2 NEGATIVE NEGATIVE Final    Comment: (NOTE) SARS-CoV-2 target nucleic acids are NOT  DETECTED.  The SARS-CoV-2 RNA is generally detectable in upper and lower respiratory specimens during the acute phase of infection. Negative results do not preclude SARS-CoV-2 infection, do not rule out co-infections with other pathogens, and should not be used as the sole basis for treatment or other patient management decisions. Negative results must be combined with clinical observations, patient history, and epidemiological information. The expected result is Negative.  Fact Sheet for Patients: SugarRoll.be  Fact Sheet for Healthcare Providers: https://www.woods-mathews.com/  This test is not yet approved or cleared by the [place] FDA and  has been authorized for detection and/or diagnosis of SARS-CoV-2 by FDA under an Emergency Use Authorization (EUA). This EUA will remain  in effect (meaning this test can be used) for the duration of the COVID-19 declaration under Se ction 564(b)(1) of the Act, 21 U.S.C. section 360bbb-3(b)(1), unless the authorization is terminated or revoked sooner.  Performed at [facility], [place] [address], [place],  [address]          Radiology Studies: MR 3D Recon At Scanner  Result Date: 01/23/2020 CLINICAL DATA:  Jaundice. Hepatocellular carcinoma. Bilirubin equal 9.5. EXAM: MRI ABDOMEN WITHOUT AND WITH CONTRAST (INCLUDING MRCP) TECHNIQUE: Multiplanar multisequence MR imaging of the abdomen was performed both before and after the administration of intravenous contrast. Heavily T2-weighted images of the biliary and pancreatic ducts were obtained, and three-dimensional MRCP images were rendered by post processing. CONTRAST:  26mL GADAVIST GADOBUTROL 1 MMOL/ML IV SOLN COMPARISON:  MRI 10/27/2019, CT 01/22/2020 FINDINGS: Lower chest:  Lung bases are clear. Hepatobiliary: Liver has expanded in volume significantly from MRI 10/27/2019. Liver measures 23 cm in craniocaudad dimension (image  32/series 30) compared to 16 mm cm on 10/27/2019. Liver measures 19 cm in axial dimension (anterior-posterior) through a dominant RIGHT hepatic lobe lesion compared to 15 cm on comparison MRI at same level. Again demonstrated multiple round lesions throughout the liver which are near confluent in nature. In total the masses have increased significantly in size now occupy the entirety of the liver parenchyma (T2 weighted imaging, series 6). One dominant larger round lesion in the RIGHT hepatic lobe is similar measuring 3.8 cm unchanged from prior. There is no intrahepatic biliary duct dilatation. Common bile duct is normal caliber. Pancreas: Normal pancreatic parenchymal intensity. No ductal dilatation or inflammation. Spleen: Normal spleen. Adrenals/urinary tract: Adrenal glands and  kidneys are normal. Stomach/Bowel: Stomach and limited of the small bowel is unremarkable Vascular/Lymphatic: Abdominal aortic normal caliber. No retroperitoneal periportal lymphadenopathy. Musculoskeletal: No aggressive osseous lesion IMPRESSION: 1. Marked interval enlargement of the liver with diffuse infiltrative hepatocellular carcinoma. 2. Multiple round lesions now occupy the near entirety of the liver parenchyma consistent with progression of disease from 10/27/2019. 3. No intrahepatic or extrahepatic biliary duct dilatation. 4. Normal pancreas. These results will be called to the ordering clinician or representative by the Radiologist Assistant, and communication documented in the PACS or [employer]. Electronically Signed   By: Suzy Bouchard M.D.   On: 01/23/2020 15:20   MR ABDOMEN MRCP W WO CONTAST  Result Date: 01/23/2020 CLINICAL DATA:  Jaundice. Hepatocellular carcinoma. Bilirubin equal 9.5. EXAM: MRI ABDOMEN WITHOUT AND WITH CONTRAST (INCLUDING MRCP) TECHNIQUE: Multiplanar multisequence MR imaging of the abdomen was performed both before and after the administration of intravenous contrast. Heavily T2-weighted  images of the biliary and pancreatic ducts were obtained, and three-dimensional MRCP images were rendered by post processing. CONTRAST:  5mL GADAVIST GADOBUTROL 1 MMOL/ML IV SOLN COMPARISON:  MRI 10/27/2019, CT 01/22/2020 FINDINGS: Lower chest:  Lung bases are clear. Hepatobiliary: Liver has expanded in volume significantly from MRI 10/27/2019. Liver measures 23 cm in craniocaudad dimension (image 32/series 30) compared to 16 mm cm on 10/27/2019. Liver measures 19 cm in axial dimension (anterior-posterior) through a dominant RIGHT hepatic lobe lesion compared to 15 cm on comparison MRI at same level. Again demonstrated multiple round lesions throughout the liver which are near confluent in nature. In total the masses have increased significantly in size now occupy the entirety of the liver parenchyma (T2 weighted imaging, series 6). One dominant larger round lesion in the RIGHT hepatic lobe is similar measuring 3.8 cm unchanged from prior. There is no intrahepatic biliary duct dilatation. Common bile duct is normal caliber. Pancreas: Normal pancreatic parenchymal intensity. No ductal dilatation or inflammation. Spleen: Normal spleen. Adrenals/urinary tract: Adrenal glands and kidneys are normal. Stomach/Bowel: Stomach and limited of the small bowel is unremarkable Vascular/Lymphatic: Abdominal aortic normal caliber. No retroperitoneal periportal lymphadenopathy. Musculoskeletal: No aggressive osseous lesion IMPRESSION: 1. Marked interval enlargement of the liver with diffuse infiltrative hepatocellular carcinoma. 2. Multiple round lesions now occupy the near entirety of the liver parenchyma consistent with progression of disease from 10/27/2019. 3. No intrahepatic or extrahepatic biliary duct dilatation. 4. Normal pancreas. These results will be called to the ordering clinician or representative by the Radiologist Assistant, and communication documented in the PACS or [employer]. Electronically Signed   By:  Suzy Bouchard M.D.   On: 01/23/2020 15:20        Scheduled Meds: . aspirin EC  81 mg Oral Daily  . betamethasone valerate lotion   Topical Daily  . enoxaparin (LOVENOX) injection  40 mg Subcutaneous Q24H  . feeding supplement  237 mL Oral BID BM  . insulin aspart  0-15 Units Subcutaneous TID WC  . insulin aspart  0-5 Units Subcutaneous QHS  . latanoprost  1 drop Both Eyes QHS  . mometasone-formoterol  2 puff Inhalation BID   Continuous Infusions: . sodium chloride 75 mL/hr at 01/25/20 0027  . methocarbamol (ROBAXIN) IV 500 mg (01/25/20 0029)     LOS: 3 days    Time spent:35 mins.More than 50% of that time was spent in counseling and/or coordination of care.      Shelly Coss, MD [facility] P11/22/2021, 8:02 AM

## 2020-01-25 NOTE — Telephone Encounter (Signed)
Made no changes to pt's schedule per 11/19 los

## 2020-01-25 NOTE — Progress Notes (Deleted)
Patient no-showed today's appointment; provider notified for review of record.

## 2020-01-25 NOTE — Care Management Important Message (Signed)
impImportant Message  Patient Details IM Letter given to the Patient. Name: ANGELICA WIX MRN: [id number] Date of Birth: 05-Oct-1950   Medicare Important Message Given:  Yes     Kerin Salen 01/25/2020, 10:35 AM

## 2020-01-25 NOTE — Progress Notes (Addendum)
HEMATOLOGY-ONCOLOGY PROGRESS NOTE  SUBJECTIVE: [name] was admitted from our office on Friday due to abdominal pain and worsening liver function.  GI consult obtained over the weekend.  MRCP performed which showed marked interval enlargement of the liver with diffuse infiltrative HCC.  The patient reports improvement in his abdominal pain but reports back pain.  He also reports pruritus.  Oncology History  Cancer, hepatocellular ([place])  03/13/2019 Initial Diagnosis   Cancer, hepatocellular ([place])   03/20/2019 -  Chemotherapy   The patient had atezolizumab (TECENTRIQ) 1,200 mg in sodium chloride 0.9 % 250 mL chemo infusion, 1,200 mg, Intravenous, Once, 10 of 12 cycles Administration: 1,200 mg (03/20/2019), 1,200 mg (04/24/2019), 1,200 mg (05/15/2019), 1,200 mg (06/05/2019), 1,200 mg (07/17/2019), 1,200 mg (06/25/2019), 1,200 mg (08/07/2019), 1,200 mg (08/28/2019), 1,200 mg (09/18/2019), 1,200 mg (10/09/2019) bevacizumab-bvzr (ZIRABEV) 1,300 mg in sodium chloride 0.9 % 100 mL chemo infusion, 15 mg/kg = 1,300 mg, Intravenous,  Once, 10 of 12 cycles Administration: 1,300 mg (03/20/2019), 1,300 mg (04/24/2019), 1,300 mg (05/15/2019), 1,300 mg (06/05/2019), 1,300 mg (07/17/2019), 1,300 mg (06/25/2019), 1,300 mg (08/07/2019), 1,300 mg (08/28/2019), 1,300 mg (09/18/2019), 1,300 mg (10/09/2019)  for chemotherapy treatment.      PHYSICAL EXAMINATION:  Vitals:   01/25/20 0610 01/25/20 0728  BP: 137/65   Pulse: 85   Resp: 16   Temp: 97.7 F (36.5 C)   SpO2: 97% 98%   Filed Weights   01/22/20 1523  Weight: 83.9 kg    Intake/Output from previous day: 11/21 0701 - 11/22 0700 In: 1160 [P.O.:1160] Out: -   GENERAL:alert, no distress and comfortable EYES: Scleral icterus present LUNGS: clear to auscultation and percussion with normal breathing effort HEART: regular rate & rhythm and no murmurs and no lower extremity edema ABDOMEN: Positive bowel sounds, soft, no tenderness, liver palpable NEURO: alert & oriented x 3  with fluent speech, no focal motor/sensory deficits  LABORATORY DATA:  I have reviewed the data as listed CMP Latest Ref Rng & Units 01/25/2020 01/24/2020 01/23/2020  Glucose 70 - 99 mg/dL 87 97 189(H)  BUN 8 - 23 mg/dL 12 13 29(H)  Creatinine 0.61 - 1.24 mg/dL 0.68 0.68 1.00  Sodium 135 - 145 mmol/L 126(L) 127(L) 126(L)  Potassium 3.5 - 5.1 mmol/L 4.7 4.7 4.4  Chloride 98 - 111 mmol/L 96(L) 96(L) 94(L)  CO2 22 - 32 mmol/L 19(L) 21(L) 22  Calcium 8.9 - 10.3 mg/dL 8.2(L) 8.0(L) 8.1(L)  Total Protein 6.5 - 8.1 g/dL 6.9 6.7 6.5  Total Bilirubin 0.3 - 1.2 mg/dL 13.6(H) 11.1(H) 9.5(H)  Alkaline Phos 38 - 126 U/L 808(H) 821(H) 871(H)  AST 15 - 41 U/L 447(H) 454(H) 576(H)  ALT 0 - 44 U/L 240(H) 259(H) 312(H)    Lab Results  Component Value Date   WBC 9.7 01/23/2020   HGB 11.6 (L) 01/23/2020   HCT 36.3 (L) 01/23/2020   MCV 82.5 01/23/2020   PLT 688 (H) 01/23/2020   NEUTROABS 8.4 (H) 01/20/2020    CT ABDOMEN PELVIS WO CONTRAST  Result Date: 01/22/2020 CLINICAL DATA:  69 year old male with right lower quadrant abdominal pain. EXAM: CT ABDOMEN AND PELVIS WITHOUT CONTRAST TECHNIQUE: Multidetector CT imaging of the abdomen and pelvis was performed following the standard protocol without IV contrast. COMPARISON:  CT abdomen pelvis date 01/07/2020. FINDINGS: Evaluation of this exam is limited in the absence of intravenous contrast. Lower chest: The visualized lung bases are clear. There is coronary vascular calcification. No intra-abdominal free air. Trace perihepatic ascites. Hepatobiliary: Morphologic changes of  cirrhosis. The liver is enlarged measuring approximately 19 cm in midclavicular length. No intrahepatic biliary dilatation. High attenuating content within gallbladder may represent sludge or vicarious excretion of intravenous contrast from recent administration. No pericholecystic fluid or evidence of acute cholecystitis by CT. Pancreas: Unremarkable. No pancreatic ductal dilatation or  surrounding inflammatory changes. Spleen: Normal in size without focal abnormality. Adrenals/Urinary Tract: The adrenal glands unremarkable. The kidneys, visualized ureters, and urinary bladder appear unremarkable. Stomach/Bowel: Small hiatal hernia. There is moderate stool throughout the colon. There is no bowel obstruction or active inflammation. The appendix is normal. Vascular/Lymphatic: Advanced aortoiliac atherosclerotic disease. The IVC is unremarkable. No portal venous gas. There is no adenopathy. Reproductive: Prostate and seminal vesicles are grossly unremarkable. No pelvic mass. Other: There is a 2 x 3 cm intramuscular lipoma along the right posterior flank. Musculoskeletal: Mild degenerative changes of the spine. No acute osseous pathology. Areas of sclerotic changes involving the right femoral head may represent early avascular necrosis. No cortical collapse or fragmentation. IMPRESSION: 1. No acute intra-abdominal or pelvic pathology. No bowel obstruction. Normal appendix. 2. Cirrhosis with trace perihepatic ascites. 3. Aortic Atherosclerosis (ICD10-I70.0). Electronically Signed   By: Anner Crete M.D.   On: 01/22/2020 20:35   DG Chest 2 View  Result Date: 01/09/2020 CLINICAL DATA:  69 year old male with history of shortness of breath. EXAM: CHEST - 2 VIEW COMPARISON:  Chest x-ray 01/07/2020. FINDINGS: Elevation of the right hemidiaphragm. Lung volumes are normal. No consolidative airspace disease. No pleural effusions. No pneumothorax. No pulmonary nodule or mass noted. Pulmonary vasculature and the cardiomediastinal silhouette are within normal limits. IMPRESSION: 1. No radiographic evidence of acute cardiopulmonary disease. 2. Elevation of the right hemidiaphragm, similar to the prior study. Electronically Signed   By: Vinnie Langton M.D.   On: 01/09/2020 18:46   DG Pelvis 1-2 Views  Result Date: 01/08/2020 CLINICAL DATA:  Left hip pain after fall several days ago. EXAM: PELVIS - 1-2  VIEW COMPARISON:  CT abdomen pelvis from yesterday. FINDINGS: No acute fracture or dislocation. Unchanged mild right greater than left hip joint space narrowing with marginal osteophytes. Unchanged increased subchondral sclerosis in the right femoral head. The pubic symphysis and sacroiliac joints are unremarkable. Soft tissues are unremarkable. Extensive vascular calcifications. IMPRESSION: 1. No acute osseous abnormality. 2. 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 Electronically Signed   By: Titus Dubin M.D.   On: 01/08/2020 19:09   CT ANGIO CHEST PE W OR WO CONTRAST  Result Date: 01/09/2020 CLINICAL DATA:  Pulmonary embolism (PE), suspected Tachycardia and tachypnea. Patient has history of hepatocellular carcinoma. EXAM: CT ANGIOGRAPHY CHEST WITH CONTRAST TECHNIQUE: Multidetector CT imaging of the chest was performed using the standard protocol during bolus administration of intravenous contrast. Multiplanar CT image reconstructions and MIPs were obtained to evaluate the vascular anatomy. CONTRAST:  115mL OMNIPAQUE IOHEXOL 350 MG/ML SOLN COMPARISON:  Radiograph earlier today.  Chest CT a 01/20/2019 FINDINGS: Cardiovascular: There are no filling defects within the pulmonary arteries to suggest pulmonary embolus. Evaluation is diagnostic to the segmental level. The subsegmental branches are not well assessed due to breathing motion artifact and contrast bolus timing. Aortic atherosclerosis without dissection or aneurysm. Heart is normal in size. There is a small pericardial effusion abutting the right heart border. Mediastinum/Nodes: Patulous esophagus with fluid level distally and scattered areas of wall thickening. There is a prominent paraesophageal node adjacent to the distal esophagus measuring 7 mm, series 4, image 64. No enlarged mediastinal or hilar lymph nodes. No  suspicious thyroid nodule. Lungs/Pleura: Small right pleural effusion with adjacent compressive atelectasis. No  airspace consolidation. No pulmonary mass. Ill-defined subpleural opacities in the right upper lobe are nonspecific. Upper Abdomen: Prominent size liver with nodular contours consistent with cirrhosis. No acute upper abdominal findings. Musculoskeletal: There is a lytic lesion involving the right posterior second rib at the costovertebral junction with extraosseous soft tissue component and bony expansion, series 6, image 15. This is new from prior exam. Lucent lesion within the left lateral fifth rib, this is indeterminate and was present on prior exam, not significantly changed. Review of the MIP images confirms the above findings. IMPRESSION: 1. No pulmonary embolus. 2. Small right pleural effusion with adjacent compressive atelectasis. 3. Lytic lesion involving the right posterior second rib at the costovertebral junction with extraosseous soft tissue component and bony expansion, suspicious for metastatic disease. Lucent lesion within the left lateral fifth rib, also present on exam 1 year ago and not significantly changed, nonspecific. Bone scan could be considered for whole-body osseous evaluation. 4. Patulous esophagus with fluid level distally and scattered areas of wall thickening. Prominent paraesophageal node measuring 7 mm, nonspecific. Possibility of esophagitis or reflux is raised. 5. Cirrhosis. Aortic Atherosclerosis (ICD10-I70.0). These results will be called to the ordering clinician or representative by the Radiologist Assistant, and communication documented in the PACS or [employer]. Electronically Signed   By: Keith Rake M.D.   On: 01/09/2020 23:41   CT Abdomen Pelvis W Contrast  Result Date: 01/07/2020 CLINICAL DATA:  Abdominal pain, fever. EXAM: CT ABDOMEN AND PELVIS WITH CONTRAST TECHNIQUE: Multidetector CT imaging of the abdomen and pelvis was performed using the standard protocol following bolus administration of intravenous contrast. CONTRAST:  119mL OMNIPAQUE IOHEXOL 300  MG/ML  SOLN COMPARISON:  February 18, 2019.  October 27, 2019. FINDINGS: Lower chest: Minimal right pleural effusion is noted with adjacent subsegmental atelectasis. Hepatobiliary: No gallstones or biliary dilatation is noted. Nodular hepatic contours are noted concerning for cirrhosis. Multiple ill-defined low densities are noted throughout hepatic parenchyma consistent with the history of hepatocellular carcinoma. Pancreas: Unremarkable. No pancreatic ductal dilatation or surrounding inflammatory changes. Spleen: Normal in size without focal abnormality. Adrenals/Urinary Tract: Adrenal glands are unremarkable. Kidneys are normal, without renal calculi, focal lesion, or hydronephrosis. Bladder is unremarkable. Stomach/Bowel: Stomach is within normal limits. Appendix appears normal. No evidence of bowel wall thickening, distention, or inflammatory changes. Vascular/Lymphatic: Aortic atherosclerosis. No enlarged abdominal or pelvic lymph nodes. Reproductive: Prostate is unremarkable. Other: No abdominal wall hernia or abnormality. No abdominopelvic ascites. Musculoskeletal: No acute or significant osseous findings. IMPRESSION: 1. Nodular hepatic contours are noted concerning for cirrhosis. Multiple ill-defined low densities are noted throughout hepatic parenchyma consistent with the history of hepatocellular carcinoma as noted on prior MRI. 2. Minimal right pleural effusion is noted with adjacent subsegmental atelectasis. 3. Aortic atherosclerosis. Aortic Atherosclerosis (ICD10-I70.0). Electronically Signed   By: Marijo Conception M.D.   On: 01/07/2020 14:55   MR 3D Recon At Scanner  Result Date: 01/23/2020 CLINICAL DATA:  Jaundice. Hepatocellular carcinoma. Bilirubin equal 9.5. EXAM: MRI ABDOMEN WITHOUT AND WITH CONTRAST (INCLUDING MRCP) TECHNIQUE: Multiplanar multisequence MR imaging of the abdomen was performed both before and after the administration of intravenous contrast. Heavily T2-weighted images of the  biliary and pancreatic ducts were obtained, and three-dimensional MRCP images were rendered by post processing. CONTRAST:  76mL GADAVIST GADOBUTROL 1 MMOL/ML IV SOLN COMPARISON:  MRI 10/27/2019, CT 01/22/2020 FINDINGS: Lower chest:  Lung bases are clear. Hepatobiliary: Liver has expanded  in volume significantly from MRI 10/27/2019. Liver measures 23 cm in craniocaudad dimension (image 32/series 30) compared to 16 mm cm on 10/27/2019. Liver measures 19 cm in axial dimension (anterior-posterior) through a dominant RIGHT hepatic lobe lesion compared to 15 cm on comparison MRI at same level. Again demonstrated multiple round lesions throughout the liver which are near confluent in nature. In total the masses have increased significantly in size now occupy the entirety of the liver parenchyma (T2 weighted imaging, series 6). One dominant larger round lesion in the RIGHT hepatic lobe is similar measuring 3.8 cm unchanged from prior. There is no intrahepatic biliary duct dilatation. Common bile duct is normal caliber. Pancreas: Normal pancreatic parenchymal intensity. No ductal dilatation or inflammation. Spleen: Normal spleen. Adrenals/urinary tract: Adrenal glands and kidneys are normal. Stomach/Bowel: Stomach and limited of the small bowel is unremarkable Vascular/Lymphatic: Abdominal aortic normal caliber. No retroperitoneal periportal lymphadenopathy. Musculoskeletal: No aggressive osseous lesion IMPRESSION: 1. Marked interval enlargement of the liver with diffuse infiltrative hepatocellular carcinoma. 2. Multiple round lesions now occupy the near entirety of the liver parenchyma consistent with progression of disease from 10/27/2019. 3. No intrahepatic or extrahepatic biliary duct dilatation. 4. Normal pancreas. These results will be called to the ordering clinician or representative by the Radiologist Assistant, and communication documented in the PACS or [employer]. Electronically Signed   By: Suzy Bouchard  M.D.   On: 01/23/2020 15:20   US Abdomen Limited  Result Date: 01/20/2020 CLINICAL DATA:  HCC, ascites search EXAM: LIMITED ABDOMEN ULTRASOUND FOR ASCITES TECHNIQUE: Limited ultrasound survey for ascites was performed in all four abdominal quadrants. COMPARISON:  01/08/2020 FINDINGS: Four quadrant ascites search reveals no significant ascites. IMPRESSION: No significant ascites. Electronically Signed   By: Eddie Candle M.D.   On: 01/20/2020 14:13   DG Chest Port 1 View  Result Date: 01/07/2020 CLINICAL DATA:  Sepsis. EXAM: PORTABLE CHEST 1 VIEW COMPARISON:  January 20, 2019. FINDINGS: The heart size and mediastinal contours are within normal limits. Both lungs are clear. The visualized skeletal structures are unremarkable. IMPRESSION: No active disease. Electronically Signed   By: Marijo Conception M.D.   On: 01/07/2020 13:31   MR ABDOMEN MRCP W WO CONTAST  Result Date: 01/23/2020 CLINICAL DATA:  Jaundice. Hepatocellular carcinoma. Bilirubin equal 9.5. EXAM: MRI ABDOMEN WITHOUT AND WITH CONTRAST (INCLUDING MRCP) TECHNIQUE: Multiplanar multisequence MR imaging of the abdomen was performed both before and after the administration of intravenous contrast. Heavily T2-weighted images of the biliary and pancreatic ducts were obtained, and three-dimensional MRCP images were rendered by post processing. CONTRAST:  73mL GADAVIST GADOBUTROL 1 MMOL/ML IV SOLN COMPARISON:  MRI 10/27/2019, CT 01/22/2020 FINDINGS: Lower chest:  Lung bases are clear. Hepatobiliary: Liver has expanded in volume significantly from MRI 10/27/2019. Liver measures 23 cm in craniocaudad dimension (image 32/series 30) compared to 16 mm cm on 10/27/2019. Liver measures 19 cm in axial dimension (anterior-posterior) through a dominant RIGHT hepatic lobe lesion compared to 15 cm on comparison MRI at same level. Again demonstrated multiple round lesions throughout the liver which are near confluent in nature. In total the masses have increased  significantly in size now occupy the entirety of the liver parenchyma (T2 weighted imaging, series 6). One dominant larger round lesion in the RIGHT hepatic lobe is similar measuring 3.8 cm unchanged from prior. There is no intrahepatic biliary duct dilatation. Common bile duct is normal caliber. Pancreas: Normal pancreatic parenchymal intensity. No ductal dilatation or inflammation. Spleen: Normal spleen. Adrenals/urinary tract: Adrenal  glands and kidneys are normal. Stomach/Bowel: Stomach and limited of the small bowel is unremarkable Vascular/Lymphatic: Abdominal aortic normal caliber. No retroperitoneal periportal lymphadenopathy. Musculoskeletal: No aggressive osseous lesion IMPRESSION: 1. Marked interval enlargement of the liver with diffuse infiltrative hepatocellular carcinoma. 2. Multiple round lesions now occupy the near entirety of the liver parenchyma consistent with progression of disease from 10/27/2019. 3. No intrahepatic or extrahepatic biliary duct dilatation. 4. Normal pancreas. These results will be called to the ordering clinician or representative by the Radiologist Assistant, and communication documented in the PACS or [employer]. Electronically Signed   By: Suzy Bouchard M.D.   On: 01/23/2020 15:20   DG FEMUR MIN 2 VIEWS LEFT  Result Date: 01/08/2020 CLINICAL DATA:  Left hip pain following fall several days ago, initial encounter EXAM: LEFT FEMUR 2 VIEWS COMPARISON:  None. FINDINGS: Degenerative changes of left hip joint are noted. No acute fracture or dislocation is noted. Heavy atherosclerotic calcifications are noted. 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 No soft tissue abnormality is noted. IMPRESSION: No acute abnormality noted.  Degenerative changes are seen. Electronically Signed   By: Inez Catalina M.D.   On: 01/08/2020 19:03   VAS [place] LOWER EXTREMITY VENOUS (DVT)  Result Date: 01/10/2020  Lower Venous DVT Study Indications: Elevated d-dimer=6.55. Rhabdomyolysis.  Hepatocellular carcinoma.  Limitations: Poor ultrasound/tissue interface. Comparison Study: No prior study Performing Technologist: Maudry Mayhew MHA, RDMS, RVT, RDCS  Examination Guidelines: A complete evaluation includes B-mode imaging, spectral Doppler, color Doppler, and power Doppler as needed of all accessible portions of each vessel. Bilateral testing is considered an integral part of a complete examination. Limited examinations for reoccurring indications may be performed as noted. The reflux portion of the exam is performed with the patient in reverse Trendelenburg.  +---------+---------------+---------+-----------+----------+--------------+ RIGHT    CompressibilityPhasicitySpontaneityPropertiesThrombus Aging +---------+---------------+---------+-----------+----------+--------------+ CFV      Full           Yes      Yes                                 +---------+---------------+---------+-----------+----------+--------------+ SFJ      Full                                                        +---------+---------------+---------+-----------+----------+--------------+ FV Prox  Full                                                        +---------+---------------+---------+-----------+----------+--------------+ FV Mid   Full                                                        +---------+---------------+---------+-----------+----------+--------------+ FV DistalFull                                                        +---------+---------------+---------+-----------+----------+--------------+  PFV      Full                                                        +---------+---------------+---------+-----------+----------+--------------+ POP      Full           Yes      Yes                                 +---------+---------------+---------+-----------+----------+--------------+ PTV      Full                                                         +---------+---------------+---------+-----------+----------+--------------+ PERO     Full                                                        +---------+---------------+---------+-----------+----------+--------------+   +---------+---------------+---------+-----------+----------+--------------+ LEFT     CompressibilityPhasicitySpontaneityPropertiesThrombus Aging +---------+---------------+---------+-----------+----------+--------------+ CFV      Full           Yes      Yes                                 +---------+---------------+---------+-----------+----------+--------------+ SFJ      Full                                                        +---------+---------------+---------+-----------+----------+--------------+ FV Prox  Full                                                        +---------+---------------+---------+-----------+----------+--------------+ FV Mid   Full                                                        +---------+---------------+---------+-----------+----------+--------------+ FV DistalFull                                                        +---------+---------------+---------+-----------+----------+--------------+ PFV      Full                                                        +---------+---------------+---------+-----------+----------+--------------+  POP      Full           Yes      Yes                                 +---------+---------------+---------+-----------+----------+--------------+ PTV      Full                                                        +---------+---------------+---------+-----------+----------+--------------+ PERO     Full                                                        +---------+---------------+---------+-----------+----------+--------------+     Summary: RIGHT: - There is no evidence of deep vein thrombosis in the lower extremity.  - No cystic structure  found in the popliteal fossa.  LEFT: - There is no evidence of deep vein thrombosis in the lower extremity.  - No cystic structure found in the popliteal fossa.  *See table(s) above for measurements and observations. Electronically signed by Servando Snare MD on 01/10/2020 at 8:34:30 PM.    Final    US ABDOMEN LIMITED RUQ (LIVER/GB)  Result Date: 01/08/2020 CLINICAL DATA:  Right upper quadrant pain EXAM: ULTRASOUND ABDOMEN LIMITED RIGHT UPPER QUADRANT COMPARISON:  Abdomen and pelvis CT from yesterday FINDINGS: Gallbladder: No gallstones or wall thickening visualized. No sonographic Murphy sign noted by sonographer. Common bile duct: Diameter: 3 mm Liver: Cirrhotic liver morphology including capsular nodularity. There is diffuse heterogeneity from hepatocellular disease and masses by prior CT and MRI. Portal vein is patent on color Doppler imaging with normal direction of blood flow towards the liver. IMPRESSION: 1. Negative gallbladder. 2. Known cirrhosis and hepatocellular carcinoma. Electronically Signed   By: Monte Fantasia M.D.   On: 01/08/2020 11:01    ASSESSMENT AND PLAN: 1. Hepatocellular carcinoma  02/12/2019 AFP 10,200  02/18/2019 CT abdomen-dominant poorly marginated heterogeneously enhancing 8.6 x 7.7 cm posterior right liver lobe mass, new compared to CT study 09/10/2016. Numerous similar-appearing liver masses scattered throughout the liver all new since 09/10/2016 CT. Diffusely irregular liver surface compatible with cirrhosis. Spleen normal size. No ascites. No abdominal adenopathy.  MRI liver 03/05/2019-multifocal hepatic metastases including a 10 cm lesion in the right liver,Li-rads 5, no evidence of metastatic disease  Cycle 1 atezolizumab/bevacizumab 03/20/2019  Cycle 2 atezolizumab/bevacizumab 04/24/2019  Cycle 3 atezolizumab/bevacizumab 05/15/2019  Cycle 4 atezolizumab/bevacizumab 06/05/2019  MRI liver 06/24/2019-multifocal hepatocellular carcinoma, lesions stable to decreased  in size. No evidence for disease progression. Cirrhosis.  Cycle 5 atezolizumab/bevacizumab 06/25/2019  Cycle 6 atezolizumab/bevacizumab 07/17/2019  Cycle 7 atezolizumab/bevacizumab 08/07/2019  Cycle 8 atezolizumab/bevacizumab 08/28/2019  Cycle 9atezolizumab/bevacizumab 09/18/2019  Cycle 10 atezolizumab/bevacizumab 10/09/2019  MRI liver 10/27/2019-enlargement of multifocal liver lesions, cirrhosis  Lenvatinib beginning 11/10/2019, held starting 11/23/2019 secondary to diarrhea and mucositis  Lenvatinib resumed at 4 mg daily beginning 12/01/2019  CT  abdomen/pelvis 01/07/2020-nodular hepatic contours noted concerning for cirrhosis.  Multiple ill-defined low densities throughout hepatic parenchyma.  Minimal right effusion with adjacent subsegmental atelectasis.  CT chest 01/09/2020-small right pleural effusion with adjacent compressive atelectasis; lytic lesion  involving the right posterior second rib at the costovertebral junction with extraosseous soft tissue component and bony expansion suspicious for metastatic disease. Lucent lesion within the left lateral fifth rib also present exam 1 year ago and not significantly changed. 2. 01/20/2019 chest CT-no definite evidence of pulmonary embolus. Wall thickening of the distal esophagus. Nodular hepatic contours. 3. Cirrhosis  4. Hepatitis C status post [place] 5. Type 2 diabetes 6. COPD 7. CAD 8. Chronic diastolic heart failure 9. Hypertension 10. Positive Cologuard 2019 11. Diarrhea and mucositis 11/23/2019 12. Dehydration secondary to diarrhea 11/23/2019 13. Right arm/shoulder pain-plain x-ray right shoulder with slight irregularity inferior glenoid, no fracture or dislocation; right humerus with mild degenerative changes in the acromioclavicular joint, thinning humeral joint, no acute fracture or dislocation and no lytic or sclerotic lesion noted.Pain resolved 11/30/2019.CT 12/10/2019-suspected full-thickness supraspinatus tear with mild  muscle atrophy. 3 x 4 x 9 mm oval lucent lesion in the posterior cortex of the proximal humeral metaphysis, nonspecific, unlikely to represent metastatic disease; moderate acromioclavicular and mild glenohumeral osteoarthritis. [facility] admission 01/07/2020-sepsis 15. Hospital admission 01/22/2020-abdominal pain, transaminitis, hyperbilirubinemia, AKI  Mr. Mariea Clonts was admitted with abdominal pain which has now resolved.  He has developed back pain and pruritus.  MRCP showed significant disease progression in the liver.  He has mild improvement in his transaminitis but worsening hyperbilirubinemia.  Recommendations: 1.  Discussed imaging results with the patient.  Unfortunately, no good treatment options are available.  Recommend referral to hospice and the patient is agreeable. 2.  Discussed ACLS and CODE STATUS.  The patient wants to talk with his sister before changing CODE STATUS to DNR. 3.  Continue oxycodone for pain. 4.  Continue topical Benadryl and hydroxyzine for itching.   LOS: 3 days   Mikey Bussing, DNP, AGPCNP-BC, AOCNP 01/25/20 Mr. Willis was interviewed and examined.  He was admitted with severe right upper abdomen pain.  There is clinical, laboratory, and radiologic evidence of progressive hepatocellular carcinoma.  I discussed the CT/MRI findings with Mr. Somers.  He is not a candidate for further systemic therapy.  I doubt he will be a candidate for hepatic directed therapy with the hyperbilirubinemia and extensive liver disease.  There was evidence of a right second rib metastasis on the CT 01/09/2020.  I will discuss the case with interventional radiology.  I recommend hospice care.  Mr. Conwell is in agreement.  He would like me to contact his sister.  We discussed CODE STATUS.  He would like to think about this until he has discussions with his sister.  His pain is under good control with current narcotic regimen.  Addendum: I discussed the case with his sister, [name].  She understands the poor prognosis.  She reports that her brother plans to move in with Mr. Tavano.  He will also have help at home from his daughter who is working from home.  I discussed the poor prognosis and hospice recommendation.  I also discussed the case with interventional radiology.  He is not a candidate for hepatic directed therapy.

## 2020-01-25 NOTE — TOC Initial Note (Signed)
Transition of Care [facility]) - Initial/Assessment Note    Patient Details  Name: [name] MRN: [id number] Date of Birth: [birth year]  Transition of Care [facility]) CM/SW Contact:    Kyle Catalan, RN Phone Number: 01/25/2020, 3:13 PM  Clinical Narrative:                 Spoke with pt at bedside. TOC consult had been placed for [facility]). Pt states he lives alone and his family is discussing how they can support him at home. He states that he does want hospice services and his wish is to be at home but his family needs to iron out the details of how much they can help care for him at home. Spoke with pt sister [name] via phone in the pt room an helped answer questions about DME that can be arranged in the home for pt and how often hospice caregivers will do visits. TOC will check back in with pt tomorrow after family has had a chance to meet and make plans.  Expected Discharge Plan: Home w Hospice Care Barriers to Discharge: Continued Medical Work up   Patient Goals and CMS Choice        Expected Discharge Plan and Services Expected Discharge Plan: Home w Hospice Care      HH Arranged: Disease Management     Prior Living Arrangements/Services              Need for Family Participation in Patient Care: Yes (Comment) Care giver support system in place?: Yes (comment)      Activities of Daily Living Home Assistive Devices/Equipment: CBG Meter ADL Screening (condition at time of admission) Patient's cognitive ability adequate to safely complete daily activities?: Yes Is the patient deaf or have difficulty hearing?: No Does the patient have difficulty seeing, even when wearing glasses/contacts?: No Does the patient have difficulty concentrating, remembering, or making decisions?: No Patient able to express need for assistance with ADLs?: Yes Does the patient have difficulty dressing or bathing?: No Independently performs ADLs?: Yes (appropriate for developmental  age) Does the patient have difficulty walking or climbing stairs?: Yes Weakness of Legs: Both Weakness of Arms/Hands: Both   Admission diagnosis:  Abdominal pain [R10.9] Patient Active Problem List   Diagnosis Date Noted  . Abdominal pain 01/07/2020  . Pruritus 07/03/2019  . Cancer, hepatocellular ([place]) 03/13/2019  . Intractable hiccups 01/27/2019  . Costochondritis 01/26/2019  . Insomnia 11/06/2018  . ED (erectile dysfunction) 01/09/2016  . Hepatic cirrhosis ([place]) 08/31/2015  . Goals of care, counseling/discussion 06/24/2015  . Chronic diastolic CHF (congestive heart failure) ([place]) 03/08/2015  . Insulin dependent type 2 diabetes mellitus ([place])   . PAD (peripheral artery disease) ([place])   . Hyperlipidemia associated with type 2 diabetes mellitus ([place])   . Mild intermittent asthma 08/18/2013  . ASCVD (arteriosclerotic cardiovascular disease) 02/11/2013  . Osteoarthritis 10/27/2012  . Former smoker 09/19/2012  . Essential hypertension, benign 08/20/2012  . GERD (gastroesophageal reflux disease) 08/20/2012  . PVD (peripheral vascular disease) ([place]) 08/20/2012   PCP:  Kyle Koch, MD Pharmacy:   [place] ([place]), [place] - [address] [place] [address] Phone: [phone] Fax: [facility] - [place], [place] [address] Phone: [phone] Fax: [phone]  [place], [place] [address]  Phone: [phone] Fax: [phone]     Social Determinants of Health (SDOH) Interventions    Readmission Risk Interventions No flowsheet data found.

## 2020-01-26 ENCOUNTER — Encounter: Payer: Self-pay | Admitting: *Deleted

## 2020-01-26 ENCOUNTER — Telehealth: Payer: Self-pay | Admitting: Nurse Practitioner

## 2020-01-26 ENCOUNTER — Telehealth: Payer: Self-pay | Admitting: Internal Medicine

## 2020-01-26 DIAGNOSIS — R109 Unspecified abdominal pain: Secondary | ICD-10-CM | POA: Diagnosis not present

## 2020-01-26 DIAGNOSIS — R7401 Elevation of levels of liver transaminase levels: Secondary | ICD-10-CM | POA: Diagnosis not present

## 2020-01-26 DIAGNOSIS — N179 Acute kidney failure, unspecified: Secondary | ICD-10-CM | POA: Diagnosis not present

## 2020-01-26 DIAGNOSIS — R1011 Right upper quadrant pain: Secondary | ICD-10-CM | POA: Diagnosis not present

## 2020-01-26 LAB — COMPREHENSIVE METABOLIC PANEL
ALT: 202 U/L — ABNORMAL HIGH (ref 0–44)
AST: 449 U/L — ABNORMAL HIGH (ref 15–41)
Albumin: 2.1 g/dL — ABNORMAL LOW (ref 3.5–5.0)
Alkaline Phosphatase: 726 U/L — ABNORMAL HIGH (ref 38–126)
Anion gap: 10 (ref 5–15)
BUN: 17 mg/dL (ref 8–23)
CO2: 19 mmol/L — ABNORMAL LOW (ref 22–32)
Calcium: 7.9 mg/dL — ABNORMAL LOW (ref 8.9–10.3)
Chloride: 97 mmol/L — ABNORMAL LOW (ref 98–111)
Creatinine, Ser: 0.7 mg/dL (ref 0.61–1.24)
GFR, Estimated: >60 mL/min (ref >60)
Glucose, Bld: 93 mg/dL (ref 70–99)
Potassium: 4.6 mmol/L (ref 3.5–5.1)
Sodium: 126 mmol/L — ABNORMAL LOW (ref 135–145)
Total Bilirubin: 13.7 mg/dL — ABNORMAL HIGH (ref 0.3–1.2)
Total Protein: 6.7 g/dL (ref 6.5–8.1)

## 2020-01-26 LAB — GLUCOSE, CAPILLARY
Glucose-Capillary: 125 mg/dL — ABNORMAL HIGH (ref 70–99)
Glucose-Capillary: 128 mg/dL — ABNORMAL HIGH (ref 70–99)

## 2020-01-26 MED ORDER — HYDROMORPHONE HCL 2 MG PO TABS
2.0000 mg | ORAL_TABLET | Freq: Three times a day (TID) | ORAL | 0 refills | Status: AC | PRN
Start: 2020-01-26 — End: 2020-01-31

## 2020-01-26 MED ORDER — METHOCARBAMOL 500 MG PO TABS: 500.0000 mg | ORAL_TABLET | Freq: Four times a day (QID) | ORAL | 0 refills | Status: AC | PRN

## 2020-01-26 MED ORDER — NOVOLIN 70/30 (70-30) 100 UNIT/ML ~~LOC~~ SUSP: 15.0000 [IU] | Freq: Two times a day (BID) | SUBCUTANEOUS | 11 refills | Status: AC

## 2020-01-26 MED ORDER — TRAZODONE HCL 50 MG PO TABS: 50.0000 mg | ORAL_TABLET | Freq: Every day | ORAL | Status: DC

## 2020-01-26 MED ORDER — PANTOPRAZOLE SODIUM 40 MG PO TBEC: 40.0000 mg | DELAYED_RELEASE_TABLET | Freq: Every day | ORAL | 1 refills | Status: AC

## 2020-01-26 MED ORDER — FUROSEMIDE 20 MG PO TABS: 20.0000 mg | ORAL_TABLET | Freq: Every day | ORAL | 1 refills | Status: AC

## 2020-01-26 MED ORDER — HYDROMORPHONE HCL 1 MG/ML IJ SOLN
1.0000 mg | INTRAMUSCULAR | Status: DC | PRN
  Administered 2020-01-26: 1 mg via INTRAVENOUS
  Filled 2020-01-26: qty 1

## 2020-01-26 MED ORDER — DIPHENHYDRAMINE-ZINC ACETATE 2-0.1 % EX CREA: TOPICAL_CREAM | Freq: Three times a day (TID) | CUTANEOUS | 2 refills | Status: AC

## 2020-01-26 MED ORDER — URSODIOL 300 MG PO CAPS: 300.0000 mg | ORAL_CAPSULE | Freq: Two times a day (BID) | ORAL | 1 refills | Status: AC

## 2020-01-26 MED ORDER — PANTOPRAZOLE SODIUM 40 MG PO TBEC
40.0000 mg | DELAYED_RELEASE_TABLET | Freq: Every day | ORAL | Status: DC
  Administered 2020-01-26: 40 mg via ORAL
  Filled 2020-01-26: qty 1

## 2020-01-26 MED ORDER — ONDANSETRON HCL 4 MG PO TABS: 4.0000 mg | ORAL_TABLET | Freq: Three times a day (TID) | ORAL | 1 refills | Status: AC | PRN

## 2020-01-26 NOTE — Consult Note (Signed)
[facility] Inpatient Consult   01/26/2020  [name] Jan 05, 1951 [id number]  [facility] [[facility]]  Accountable Care Organization [ACO] Patient: [name] Medicare  Patient assessed for less than 30 days readmission.  Patient is currently active with [facility] [[facility]] Care Management for chronic disease management services.  Patient has been engaged by a [facility] RN Care Management Coordinator.  Our community based plan of care has focused on disease management and community resource support.    Plan:  Follow with Inpatient Transition Of Care [TOC] team member for progression of disposition and to make aware that [facility] Care Management following.   Of note, [facility] Care Management services does not replace or interfere with any services that are needed or arranged by inpatient [facility] care management team.  For additional questions or referrals please contact:   Charlesetta Shanks, RN BSN CCM [facility]  [phone] business mobile phone Toll free office [phone]  Fax number: [phone] [name]

## 2020-01-26 NOTE — TOC Progression Note (Signed)
Transition of Care [facility]) - Progression Note    Patient Details  Name: [name] MRN: [id number] Date of Birth: May 01, 1950  Transition of Care [facility]) CM/SW Contact  Jahlani Lorentz, Marjie Skiff, RN Phone Number: 01/26/2020, 12:58 PM  Clinical Narrative:     Spoke with pt again at bedside. Pt states that he and family have decided that pt should go home with hospice and family can help provide care at home. Pt requesting a hospital bed for home. When choice offered for home hospice [facility] was chosen. [facility] liaison contacted for referral.   Expected Discharge Plan: Home w Hospice Care Barriers to Discharge: Continued Medical Work up  Expected Discharge Plan and Services Expected Discharge Plan: [place] Arranged: Disease Management    Social Determinants of Health (SDOH) Interventions    Readmission Risk Interventions No flowsheet data found.

## 2020-01-26 NOTE — Telephone Encounter (Signed)
Scheduled appts per 11/23 sch msg. Pt confirmed appt date and time.

## 2020-01-26 NOTE — Progress Notes (Signed)
Per Dr. Benay Spice: Does not need lab/OV on 11/24-cancelled appointments. Per d/c nurse, patient was aware. New scheduling message placed for OV next week.

## 2020-01-26 NOTE — Progress Notes (Addendum)
HEMATOLOGY-ONCOLOGY PROGRESS NOTE  SUBJECTIVE: Pruritus has resolved.  Still with intermittent abdominal pain.  Denies nausea and vomiting.  The patient wants to discharge to home today and will have hospice follow him in the home.  Oncology History  Cancer, hepatocellular ([place])  03/13/2019 Initial Diagnosis   Cancer, hepatocellular ([place])   03/20/2019 -  Chemotherapy   The patient had atezolizumab (TECENTRIQ) 1,200 mg in sodium chloride 0.9 % 250 mL chemo infusion, 1,200 mg, Intravenous, Once, 10 of 12 cycles Administration: 1,200 mg (03/20/2019), 1,200 mg (04/24/2019), 1,200 mg (05/15/2019), 1,200 mg (06/05/2019), 1,200 mg (07/17/2019), 1,200 mg (06/25/2019), 1,200 mg (08/07/2019), 1,200 mg (08/28/2019), 1,200 mg (09/18/2019), 1,200 mg (10/09/2019) bevacizumab-bvzr (ZIRABEV) 1,300 mg in sodium chloride 0.9 % 100 mL chemo infusion, 15 mg/kg = 1,300 mg, Intravenous,  Once, 10 of 12 cycles Administration: 1,300 mg (03/20/2019), 1,300 mg (04/24/2019), 1,300 mg (05/15/2019), 1,300 mg (06/05/2019), 1,300 mg (07/17/2019), 1,300 mg (06/25/2019), 1,300 mg (08/07/2019), 1,300 mg (08/28/2019), 1,300 mg (09/18/2019), 1,300 mg (10/09/2019)  for chemotherapy treatment.      PHYSICAL EXAMINATION:  Vitals:   01/25/20 2016 01/26/20 0459  BP: (!) 161/80 139/72  Pulse: 90 85  Resp: 18 16  Temp: 98.2 F (36.8 C) 98.1 F (36.7 C)  SpO2: 100% 97%   Filed Weights   01/22/20 1523  Weight: 83.9 kg    Intake/Output from previous day: 11/22 0701 - 11/23 0700 In: 727 [P.O.:477; IV Piggyback:250] Out: -   GENERAL:alert, no distress and comfortable EYES: Scleral icterus present LUNGS: clear to auscultation and percussion with normal breathing effort HEART: regular rate & rhythm and no murmurs and no lower extremity edema ABDOMEN: Positive bowel sounds, soft, no tenderness, liver palpable NEURO: alert & oriented x 3 with fluent speech, no focal motor/sensory deficits  LABORATORY DATA:  I have reviewed the data as listed CMP  Latest Ref Rng & Units 01/26/2020 01/25/2020 01/24/2020  Glucose 70 - 99 mg/dL 93 87 97  BUN 8 - 23 mg/dL 17 12 13   Creatinine 0.61 - 1.24 mg/dL 0.70 0.68 0.68  Sodium 135 - 145 mmol/L 126(L) 126(L) 127(L)  Potassium 3.5 - 5.1 mmol/L 4.6 4.7 4.7  Chloride 98 - 111 mmol/L 97(L) 96(L) 96(L)  CO2 22 - 32 mmol/L 19(L) 19(L) 21(L)  Calcium 8.9 - 10.3 mg/dL 7.9(L) 8.2(L) 8.0(L)  Total Protein 6.5 - 8.1 g/dL 6.7 6.9 6.7  Total Bilirubin 0.3 - 1.2 mg/dL 13.7(H) 13.6(H) 11.1(H)  Alkaline Phos 38 - 126 U/L 726(H) 808(H) 821(H)  AST 15 - 41 U/L 449(H) 447(H) 454(H)  ALT 0 - 44 U/L 202(H) 240(H) 259(H)    Lab Results  Component Value Date   WBC 9.7 01/23/2020   HGB 11.6 (L) 01/23/2020   HCT 36.3 (L) 01/23/2020   MCV 82.5 01/23/2020   PLT 688 (H) 01/23/2020   NEUTROABS 8.4 (H) 01/20/2020    CT ABDOMEN PELVIS WO CONTRAST  Result Date: 01/22/2020 CLINICAL DATA:  69 year old male with right lower quadrant abdominal pain. EXAM: CT ABDOMEN AND PELVIS WITHOUT CONTRAST TECHNIQUE: Multidetector CT imaging of the abdomen and pelvis was performed following the standard protocol without IV contrast. COMPARISON:  CT abdomen pelvis date 01/07/2020. FINDINGS: Evaluation of this exam is limited in the absence of intravenous contrast. Lower chest: The visualized lung bases are clear. There is coronary vascular calcification. No intra-abdominal free air. Trace perihepatic ascites. Hepatobiliary: Morphologic changes of cirrhosis. The liver is enlarged measuring approximately 19 cm in midclavicular length. No intrahepatic biliary dilatation. High attenuating content  within gallbladder may represent sludge or vicarious excretion of intravenous contrast from recent administration. No pericholecystic fluid or evidence of acute cholecystitis by CT. Pancreas: Unremarkable. No pancreatic ductal dilatation or surrounding inflammatory changes. Spleen: Normal in size without focal abnormality. Adrenals/Urinary Tract: The  adrenal glands unremarkable. The kidneys, visualized ureters, and urinary bladder appear unremarkable. Stomach/Bowel: Small hiatal hernia. There is moderate stool throughout the colon. There is no bowel obstruction or active inflammation. The appendix is normal. Vascular/Lymphatic: Advanced aortoiliac atherosclerotic disease. The IVC is unremarkable. No portal venous gas. There is no adenopathy. Reproductive: Prostate and seminal vesicles are grossly unremarkable. No pelvic mass. Other: There is a 2 x 3 cm intramuscular lipoma along the right posterior flank. Musculoskeletal: Mild degenerative changes of the spine. No acute osseous pathology. Areas of sclerotic changes involving the right femoral head may represent early avascular necrosis. No cortical collapse or fragmentation. IMPRESSION: 1. No acute intra-abdominal or pelvic pathology. No bowel obstruction. Normal appendix. 2. Cirrhosis with trace perihepatic ascites. 3. Aortic Atherosclerosis (ICD10-I70.0). Electronically Signed   By: Anner Crete M.D.   On: 01/22/2020 20:35   DG Chest 2 View  Result Date: 01/09/2020 CLINICAL DATA:  69 year old male with history of shortness of breath. EXAM: CHEST - 2 VIEW COMPARISON:  Chest x-ray 01/07/2020. FINDINGS: Elevation of the right hemidiaphragm. Lung volumes are normal. No consolidative airspace disease. No pleural effusions. No pneumothorax. No pulmonary nodule or mass noted. Pulmonary vasculature and the cardiomediastinal silhouette are within normal limits. IMPRESSION: 1. No radiographic evidence of acute cardiopulmonary disease. 2. Elevation of the right hemidiaphragm, similar to the prior study. Electronically Signed   By: Vinnie Langton M.D.   On: 01/09/2020 18:46   DG Pelvis 1-2 Views  Result Date: 01/08/2020 CLINICAL DATA:  Left hip pain after fall several days ago. EXAM: PELVIS - 1-2 VIEW COMPARISON:  CT abdomen pelvis from yesterday. FINDINGS: No acute fracture or dislocation. Unchanged mild  right greater than left hip joint space narrowing with marginal osteophytes. Unchanged increased subchondral sclerosis in the right femoral head. The pubic symphysis and sacroiliac joints are unremarkable. Soft tissues are unremarkable. Extensive vascular calcifications. IMPRESSION: 1. No acute osseous abnormality. 2. 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 Electronically Signed   By: Titus Dubin M.D.   On: 01/08/2020 19:09   CT ANGIO CHEST PE W OR WO CONTRAST  Result Date: 01/09/2020 CLINICAL DATA:  Pulmonary embolism (PE), suspected Tachycardia and tachypnea. Patient has history of hepatocellular carcinoma. EXAM: CT ANGIOGRAPHY CHEST WITH CONTRAST TECHNIQUE: Multidetector CT imaging of the chest was performed using the standard protocol during bolus administration of intravenous contrast. Multiplanar CT image reconstructions and MIPs were obtained to evaluate the vascular anatomy. CONTRAST:  139mL OMNIPAQUE IOHEXOL 350 MG/ML SOLN COMPARISON:  Radiograph earlier today.  Chest CT a 01/20/2019 FINDINGS: Cardiovascular: There are no filling defects within the pulmonary arteries to suggest pulmonary embolus. Evaluation is diagnostic to the segmental level. The subsegmental branches are not well assessed due to breathing motion artifact and contrast bolus timing. Aortic atherosclerosis without dissection or aneurysm. Heart is normal in size. There is a small pericardial effusion abutting the right heart border. Mediastinum/Nodes: Patulous esophagus with fluid level distally and scattered areas of wall thickening. There is a prominent paraesophageal node adjacent to the distal esophagus measuring 7 mm, series 4, image 64. No enlarged mediastinal or hilar lymph nodes. No suspicious thyroid nodule. Lungs/Pleura: Small right pleural effusion with adjacent compressive atelectasis. No airspace consolidation. No pulmonary mass. Ill-defined  subpleural opacities in the right upper lobe are nonspecific.  Upper Abdomen: Prominent size liver with nodular contours consistent with cirrhosis. No acute upper abdominal findings. Musculoskeletal: There is a lytic lesion involving the right posterior second rib at the costovertebral junction with extraosseous soft tissue component and bony expansion, series 6, image 15. This is new from prior exam. Lucent lesion within the left lateral fifth rib, this is indeterminate and was present on prior exam, not significantly changed. Review of the MIP images confirms the above findings. IMPRESSION: 1. No pulmonary embolus. 2. Small right pleural effusion with adjacent compressive atelectasis. 3. Lytic lesion involving the right posterior second rib at the costovertebral junction with extraosseous soft tissue component and bony expansion, suspicious for metastatic disease. Lucent lesion within the left lateral fifth rib, also present on exam 1 year ago and not significantly changed, nonspecific. Bone scan could be considered for whole-body osseous evaluation. 4. Patulous esophagus with fluid level distally and scattered areas of wall thickening. Prominent paraesophageal node measuring 7 mm, nonspecific. Possibility of esophagitis or reflux is raised. 5. Cirrhosis. Aortic Atherosclerosis (ICD10-I70.0). These results will be called to the ordering clinician or representative by the Radiologist Assistant, and communication documented in the PACS or [employer]. Electronically Signed   By: Keith Rake M.D.   On: 01/09/2020 23:41   CT Abdomen Pelvis W Contrast  Result Date: 01/07/2020 CLINICAL DATA:  Abdominal pain, fever. EXAM: CT ABDOMEN AND PELVIS WITH CONTRAST TECHNIQUE: Multidetector CT imaging of the abdomen and pelvis was performed using the standard protocol following bolus administration of intravenous contrast. CONTRAST:  141mL OMNIPAQUE IOHEXOL 300 MG/ML  SOLN COMPARISON:  February 18, 2019.  October 27, 2019. FINDINGS: Lower chest: Minimal right pleural effusion is  noted with adjacent subsegmental atelectasis. Hepatobiliary: No gallstones or biliary dilatation is noted. Nodular hepatic contours are noted concerning for cirrhosis. Multiple ill-defined low densities are noted throughout hepatic parenchyma consistent with the history of hepatocellular carcinoma. Pancreas: Unremarkable. No pancreatic ductal dilatation or surrounding inflammatory changes. Spleen: Normal in size without focal abnormality. Adrenals/Urinary Tract: Adrenal glands are unremarkable. Kidneys are normal, without renal calculi, focal lesion, or hydronephrosis. Bladder is unremarkable. Stomach/Bowel: Stomach is within normal limits. Appendix appears normal. No evidence of bowel wall thickening, distention, or inflammatory changes. Vascular/Lymphatic: Aortic atherosclerosis. No enlarged abdominal or pelvic lymph nodes. Reproductive: Prostate is unremarkable. Other: No abdominal wall hernia or abnormality. No abdominopelvic ascites. Musculoskeletal: No acute or significant osseous findings. IMPRESSION: 1. Nodular hepatic contours are noted concerning for cirrhosis. Multiple ill-defined low densities are noted throughout hepatic parenchyma consistent with the history of hepatocellular carcinoma as noted on prior MRI. 2. Minimal right pleural effusion is noted with adjacent subsegmental atelectasis. 3. Aortic atherosclerosis. Aortic Atherosclerosis (ICD10-I70.0). Electronically Signed   By: Marijo Conception M.D.   On: 01/07/2020 14:55   MR 3D Recon At Scanner  Result Date: 01/23/2020 CLINICAL DATA:  Jaundice. Hepatocellular carcinoma. Bilirubin equal 9.5. EXAM: MRI ABDOMEN WITHOUT AND WITH CONTRAST (INCLUDING MRCP) TECHNIQUE: Multiplanar multisequence MR imaging of the abdomen was performed both before and after the administration of intravenous contrast. Heavily T2-weighted images of the biliary and pancreatic ducts were obtained, and three-dimensional MRCP images were rendered by post processing. CONTRAST:   20mL GADAVIST GADOBUTROL 1 MMOL/ML IV SOLN COMPARISON:  MRI 10/27/2019, CT 01/22/2020 FINDINGS: Lower chest:  Lung bases are clear. Hepatobiliary: Liver has expanded in volume significantly from MRI 10/27/2019. Liver measures 23 cm in craniocaudad dimension (image 32/series 30) compared to 16  mm cm on 10/27/2019. Liver measures 19 cm in axial dimension (anterior-posterior) through a dominant RIGHT hepatic lobe lesion compared to 15 cm on comparison MRI at same level. Again demonstrated multiple round lesions throughout the liver which are near confluent in nature. In total the masses have increased significantly in size now occupy the entirety of the liver parenchyma (T2 weighted imaging, series 6). One dominant larger round lesion in the RIGHT hepatic lobe is similar measuring 3.8 cm unchanged from prior. There is no intrahepatic biliary duct dilatation. Common bile duct is normal caliber. Pancreas: Normal pancreatic parenchymal intensity. No ductal dilatation or inflammation. Spleen: Normal spleen. Adrenals/urinary tract: Adrenal glands and kidneys are normal. Stomach/Bowel: Stomach and limited of the small bowel is unremarkable Vascular/Lymphatic: Abdominal aortic normal caliber. No retroperitoneal periportal lymphadenopathy. Musculoskeletal: No aggressive osseous lesion IMPRESSION: 1. Marked interval enlargement of the liver with diffuse infiltrative hepatocellular carcinoma. 2. Multiple round lesions now occupy the near entirety of the liver parenchyma consistent with progression of disease from 10/27/2019. 3. No intrahepatic or extrahepatic biliary duct dilatation. 4. Normal pancreas. These results will be called to the ordering clinician or representative by the Radiologist Assistant, and communication documented in the PACS or [employer]. Electronically Signed   By: Suzy Bouchard M.D.   On: 01/23/2020 15:20   US Abdomen Limited  Result Date: 01/20/2020 CLINICAL DATA:  HCC, ascites search EXAM:  LIMITED ABDOMEN ULTRASOUND FOR ASCITES TECHNIQUE: Limited ultrasound survey for ascites was performed in all four abdominal quadrants. COMPARISON:  01/08/2020 FINDINGS: Four quadrant ascites search reveals no significant ascites. IMPRESSION: No significant ascites. Electronically Signed   By: Eddie Candle M.D.   On: 01/20/2020 14:13   DG Chest Port 1 View  Result Date: 01/07/2020 CLINICAL DATA:  Sepsis. EXAM: PORTABLE CHEST 1 VIEW COMPARISON:  January 20, 2019. FINDINGS: The heart size and mediastinal contours are within normal limits. Both lungs are clear. The visualized skeletal structures are unremarkable. IMPRESSION: No active disease. Electronically Signed   By: Marijo Conception M.D.   On: 01/07/2020 13:31   MR ABDOMEN MRCP W WO CONTAST  Result Date: 01/23/2020 CLINICAL DATA:  Jaundice. Hepatocellular carcinoma. Bilirubin equal 9.5. EXAM: MRI ABDOMEN WITHOUT AND WITH CONTRAST (INCLUDING MRCP) TECHNIQUE: Multiplanar multisequence MR imaging of the abdomen was performed both before and after the administration of intravenous contrast. Heavily T2-weighted images of the biliary and pancreatic ducts were obtained, and three-dimensional MRCP images were rendered by post processing. CONTRAST:  73mL GADAVIST GADOBUTROL 1 MMOL/ML IV SOLN COMPARISON:  MRI 10/27/2019, CT 01/22/2020 FINDINGS: Lower chest:  Lung bases are clear. Hepatobiliary: Liver has expanded in volume significantly from MRI 10/27/2019. Liver measures 23 cm in craniocaudad dimension (image 32/series 30) compared to 16 mm cm on 10/27/2019. Liver measures 19 cm in axial dimension (anterior-posterior) through a dominant RIGHT hepatic lobe lesion compared to 15 cm on comparison MRI at same level. Again demonstrated multiple round lesions throughout the liver which are near confluent in nature. In total the masses have increased significantly in size now occupy the entirety of the liver parenchyma (T2 weighted imaging, series 6). One dominant larger  round lesion in the RIGHT hepatic lobe is similar measuring 3.8 cm unchanged from prior. There is no intrahepatic biliary duct dilatation. Common bile duct is normal caliber. Pancreas: Normal pancreatic parenchymal intensity. No ductal dilatation or inflammation. Spleen: Normal spleen. Adrenals/urinary tract: Adrenal glands and kidneys are normal. Stomach/Bowel: Stomach and limited of the small bowel is unremarkable Vascular/Lymphatic: Abdominal aortic normal  caliber. No retroperitoneal periportal lymphadenopathy. Musculoskeletal: No aggressive osseous lesion IMPRESSION: 1. Marked interval enlargement of the liver with diffuse infiltrative hepatocellular carcinoma. 2. Multiple round lesions now occupy the near entirety of the liver parenchyma consistent with progression of disease from 10/27/2019. 3. No intrahepatic or extrahepatic biliary duct dilatation. 4. Normal pancreas. These results will be called to the ordering clinician or representative by the Radiologist Assistant, and communication documented in the PACS or [employer]. Electronically Signed   By: Suzy Bouchard M.D.   On: 01/23/2020 15:20   DG FEMUR MIN 2 VIEWS LEFT  Result Date: 01/08/2020 CLINICAL DATA:  Left hip pain following fall several days ago, initial encounter EXAM: LEFT FEMUR 2 VIEWS COMPARISON:  None. FINDINGS: Degenerative changes of left hip joint are noted. No acute fracture or dislocation is noted. Heavy atherosclerotic calcifications are noted. 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 No soft tissue abnormality is noted. IMPRESSION: No acute abnormality noted.  Degenerative changes are seen. Electronically Signed   By: Inez Catalina M.D.   On: 01/08/2020 19:03   VAS [place] LOWER EXTREMITY VENOUS (DVT)  Result Date: 01/10/2020  Lower Venous DVT Study Indications: Elevated d-dimer=6.55. Rhabdomyolysis. Hepatocellular carcinoma.  Limitations: Poor ultrasound/tissue interface. Comparison Study: No prior study Performing Technologist:  Maudry Mayhew MHA, RDMS, RVT, RDCS  Examination Guidelines: A complete evaluation includes B-mode imaging, spectral Doppler, color Doppler, and power Doppler as needed of all accessible portions of each vessel. Bilateral testing is considered an integral part of a complete examination. Limited examinations for reoccurring indications may be performed as noted. The reflux portion of the exam is performed with the patient in reverse Trendelenburg.  +---------+---------------+---------+-----------+----------+--------------+ RIGHT    CompressibilityPhasicitySpontaneityPropertiesThrombus Aging +---------+---------------+---------+-----------+----------+--------------+ CFV      Full           Yes      Yes                                 +---------+---------------+---------+-----------+----------+--------------+ SFJ      Full                                                        +---------+---------------+---------+-----------+----------+--------------+ FV Prox  Full                                                        +---------+---------------+---------+-----------+----------+--------------+ FV Mid   Full                                                        +---------+---------------+---------+-----------+----------+--------------+ FV DistalFull                                                        +---------+---------------+---------+-----------+----------+--------------+ PFV      Full                                                        +---------+---------------+---------+-----------+----------+--------------+  POP      Full           Yes      Yes                                 +---------+---------------+---------+-----------+----------+--------------+ PTV      Full                                                        +---------+---------------+---------+-----------+----------+--------------+ PERO     Full                                                         +---------+---------------+---------+-----------+----------+--------------+   +---------+---------------+---------+-----------+----------+--------------+ LEFT     CompressibilityPhasicitySpontaneityPropertiesThrombus Aging +---------+---------------+---------+-----------+----------+--------------+ CFV      Full           Yes      Yes                                 +---------+---------------+---------+-----------+----------+--------------+ SFJ      Full                                                        +---------+---------------+---------+-----------+----------+--------------+ FV Prox  Full                                                        +---------+---------------+---------+-----------+----------+--------------+ FV Mid   Full                                                        +---------+---------------+---------+-----------+----------+--------------+ FV DistalFull                                                        +---------+---------------+---------+-----------+----------+--------------+ PFV      Full                                                        +---------+---------------+---------+-----------+----------+--------------+ POP      Full           Yes      Yes                                 +---------+---------------+---------+-----------+----------+--------------+  PTV      Full                                                        +---------+---------------+---------+-----------+----------+--------------+ PERO     Full                                                        +---------+---------------+---------+-----------+----------+--------------+     Summary: RIGHT: - There is no evidence of deep vein thrombosis in the lower extremity.  - No cystic structure found in the popliteal fossa.  LEFT: - There is no evidence of deep vein thrombosis in the lower extremity.  - No cystic structure  found in the popliteal fossa.  *See table(s) above for measurements and observations. Electronically signed by Servando Snare MD on 01/10/2020 at 8:34:30 PM.    Final    US ABDOMEN LIMITED RUQ (LIVER/GB)  Result Date: 01/08/2020 CLINICAL DATA:  Right upper quadrant pain EXAM: ULTRASOUND ABDOMEN LIMITED RIGHT UPPER QUADRANT COMPARISON:  Abdomen and pelvis CT from yesterday FINDINGS: Gallbladder: No gallstones or wall thickening visualized. No sonographic Murphy sign noted by sonographer. Common bile duct: Diameter: 3 mm Liver: Cirrhotic liver morphology including capsular nodularity. There is diffuse heterogeneity from hepatocellular disease and masses by prior CT and MRI. Portal vein is patent on color Doppler imaging with normal direction of blood flow towards the liver. IMPRESSION: 1. Negative gallbladder. 2. Known cirrhosis and hepatocellular carcinoma. Electronically Signed   By: Monte Fantasia M.D.   On: 01/08/2020 11:01    ASSESSMENT AND PLAN: 1. Hepatocellular carcinoma  02/12/2019 AFP 10,200  02/18/2019 CT abdomen-dominant poorly marginated heterogeneously enhancing 8.6 x 7.7 cm posterior right liver lobe mass, new compared to CT study 09/10/2016. Numerous similar-appearing liver masses scattered throughout the liver all new since 09/10/2016 CT. Diffusely irregular liver surface compatible with cirrhosis. Spleen normal size. No ascites. No abdominal adenopathy.  MRI liver 03/05/2019-multifocal hepatic metastases including a 10 cm lesion in the right liver,Li-rads 5, no evidence of metastatic disease  Cycle 1 atezolizumab/bevacizumab 03/20/2019  Cycle 2 atezolizumab/bevacizumab 04/24/2019  Cycle 3 atezolizumab/bevacizumab 05/15/2019  Cycle 4 atezolizumab/bevacizumab 06/05/2019  MRI liver 06/24/2019-multifocal hepatocellular carcinoma, lesions stable to decreased in size. No evidence for disease progression. Cirrhosis.  Cycle 5 atezolizumab/bevacizumab 06/25/2019  Cycle 6  atezolizumab/bevacizumab 07/17/2019  Cycle 7 atezolizumab/bevacizumab 08/07/2019  Cycle 8 atezolizumab/bevacizumab 08/28/2019  Cycle 9atezolizumab/bevacizumab 09/18/2019  Cycle 10 atezolizumab/bevacizumab 10/09/2019  MRI liver 10/27/2019-enlargement of multifocal liver lesions, cirrhosis  Lenvatinib beginning 11/10/2019, held starting 11/23/2019 secondary to diarrhea and mucositis  Lenvatinib resumed at 4 mg daily beginning 12/01/2019  CT  abdomen/pelvis 01/07/2020-nodular hepatic contours noted concerning for cirrhosis.  Multiple ill-defined low densities throughout hepatic parenchyma.  Minimal right effusion with adjacent subsegmental atelectasis.  CT chest 01/09/2020-small right pleural effusion with adjacent compressive atelectasis; lytic lesion involving the right posterior second rib at the costovertebral junction with extraosseous soft tissue component and bony expansion suspicious for metastatic disease. Lucent lesion within the left lateral fifth rib also present exam 1 year ago and not significantly changed. 2. 01/20/2019 chest CT-no definite evidence of pulmonary embolus. Wall thickening of the distal esophagus. Nodular hepatic  contours. 3. Cirrhosis  4. Hepatitis C status post Harvoni 5. Type 2 diabetes 6. COPD 7. CAD 8. Chronic diastolic heart failure 9. Hypertension 10. Positive Cologuard 2019 11. Diarrhea and mucositis 11/23/2019 12. Dehydration secondary to diarrhea 11/23/2019 13. Right arm/shoulder pain-plain x-ray right shoulder with slight irregularity inferior glenoid, no fracture or dislocation; right humerus with mild degenerative changes in the acromioclavicular joint, thinning humeral joint, no acute fracture or dislocation and no lytic or sclerotic lesion noted.Pain resolved 11/30/2019.CT 12/10/2019-suspected full-thickness supraspinatus tear with mild muscle atrophy. 3 x 4 x 9 mm oval lucent lesion in the posterior cortex of the proximal humeral metaphysis,  nonspecific, unlikely to represent metastatic disease; moderate acromioclavicular and mild glenohumeral osteoarthritis. [facility] admission 01/07/2020-sepsis 15. Hospital admission 01/22/2020-abdominal pain, transaminitis, hyperbilirubinemia, AKI  [name] appears unchanged.  Pain adequately controlled with current pain medication.  The patient agrees to discharged home with hospice.  Wishes to remain a full code.  Recommendations: 1.  TOC consult was placed yesterday and discussed with case manager who will make sure arrangements are made to get the patient home with hospice following him in the home. 2.  Discussed ACLS and CODE STATUS with the patient.  He wishes to remain a full code for now. 3.  Continue current pain medications and topical Benadryl and hydroxyzine for itching. 4.  We will arrange for outpatient follow-up with cancer center.   LOS: 4 days   Mikey Bussing, DNP, AGPCNP-BC, AOCNP 01/26/20 [name] was interviewed and examined.  His pain is adequately controlled with oxycodone and Dilaudid.  We can prescribe oral Dilaudid if he has increased pain at home.  The plan is for discharge to home with hospice care.  His brother will be staying with him.  I discussed CPR and ACLS with [name].  He would like to remain on a full CODE STATUS for now.  Outpatient follow-up will be scheduled at the Cancer center.

## 2020-01-26 NOTE — Telephone Encounter (Signed)
Crystal with [facility] asking if you would be Mr. Ruggieri attending physician as he is terminally ill  Please call Ashley at [phone], can leave a message it is a secure voicemail

## 2020-01-26 NOTE — Progress Notes (Signed)
Manufacturing engineer [facility]) Hospital Liaison: RN note     Notified by [facility] [place], RN of patient/family request for [facility] services at home after discharge. Chart and patient information under review by [facility] physician. Hospice eligibility pending currently.     Writer spoke with patient  to initiate education related to hospice philosophy, services and team approach to care. Patient  verbalized understanding of information given.   Patient will need prescriptions for discharge comfort medications.      DME needs have been discussed, patient currently has the following equipment in the home: cane.  Patient/family requests the following DME for delivery to the home:  Hospital bed. [place] equipment manager has been notified and will contact DME provider to arrange delivery to the home. Home address has been verified and is correct in the chart. Patient  is  contact to arrange time of delivery.      [facility] aware of the above. Please notify [facility] when patient is ready to leave the unit at discharge. (Call [phone] or [phone] after 5pm.) [facility] information and contact numbers given to  patient.       Please call with any hospice related questions.      Thank you for this referral.     Farrel Gordon, RN, [facility] (listed on [place] under [place])   [phone]

## 2020-01-26 NOTE — Discharge Summary (Signed)
Physician Discharge Summary  ADEL BURCH XTG:[id number] DOB: 11-21-1950 DOA: 01/22/2020  PCP: Hoyt Koch, MD  Admit date: 01/22/2020 Discharge date: 01/26/2020  Admitted From: Home Disposition:  Home  Discharge Condition:Stable CODE STATUS:FULL Diet recommendation:  Regular  Brief/Interim Summary:  Patient is a unfortunate 69 year old male with history of hepatocellular carcinoma with cirrhosis who presented to the emergency department complaints of  abdominal pain.  He was recently admitted and discharged from here after being managed for severe sepsis due to Enterococcus UTI.[name]  Patient stated that he had worsening right upper quadrant pain  after discharge.  He was directed  by his oncologist Dr. Learta Codding to be admitted.  Patient found to have elevated liver enzymes on presentation.  MRI of the abdomen showed advanced hepatocellular carcinoma. After discussing goals of care, and we referred patient for hospice services .  He has been discharged today to home with hospice.  Following problems were addressed during his hospitalization:    Right upper quadrant pain: Complaint of worsening abdominal pain after discharge from here.  Found to have liver enzymes elevation from before.  CT abdomen/pelvis not show any acute findings but showed chronic changes with cirrhosis.  MRI showed severe progression of his hepatocellular carcinoma but no intra-/extra hepatic bile duct dilation.  Elevated liver enzymes/jaundice/itching: Secondary to progressive HCC.  Complains of severe itching and he has scratches on his skin.  We have started him on Benadryl cream, hydroxyzine.  Also started on ursodiol for elevated bilirubin levels.  Right upper back pain: Patient has been complaining of severe right upper back pain not responding to analgesics.  We added muscle relaxants on the  list. Back Pain could be from chronic degenerative changes in the spine.  MRCP did not show any obvious  osseous  lesions.  Continue current pain regimen, supportive care  Hepatocellular carcinoma/cirrhosis: Follows with oncology, Dr. Learta Codding.  He is hepatocellular carcinoma has significantly progressed.  Oncology discussed with the patient and provided referral for hospice.   He is still full code  AKI/hyponatremia: AKI cud be from dehydration.resolved  with   IV fluids.  He has chronic hyponatremia most likely associated with cirrhosis.  He was also on hydrochlorothiazide at home which has been discontinued.  Started on low-dose Lasix which can help with hyponatremia.  Hypertension: Home lisinopril/ Hydrochlorothiazide on hold.  Blood pressure stable without any medications  COPD: Continue bronchodilators as needed.  Not on home oxygen  Hyperlipidemia: Statin stopped due to elevated liver enzymes  GERD: Continue Protonix  Diabetes type 2: On high dose of 70/30 insulin at home.  Dose of insulin decreased on discharge because of poor oral intake.  Goals of care: Patient with hepatocellular carcinoma with severe progression of the disease.    Initiated hospice services at home.  Discharge Diagnoses:  Active Problems:   Abdominal pain    Discharge Instructions  Discharge Instructions    Diet general   Complete by: As directed    Discharge instructions   Complete by: As directed    1)Please take prescribed medications as instructed 2)Follow up with hospice at home   Increase activity slowly   Complete by: As directed      Allergies as of 01/26/2020      Reactions   Crestor [rosuvastatin] Other (See Comments)   INTOLERANCE   Metformin And Related Other (See Comments)   Stomach ache   Mobic [meloxicam] Rash      Medication List    STOP taking these medications  albuterol 108 (90 Base) MCG/ACT inhaler Commonly known as: Ventolin HFA   aspirin EC 81 MG tablet   atorvastatin 40 MG tablet Commonly known as: LIPITOR   Baclofen 5 MG Tabs   lenvatinib 4 mg daily dose  capsule Commonly known as: LENVIMA   lisinopril-hydrochlorothiazide 20-25 MG tablet Commonly known as: ZESTORETIC   oxyCODONE 5 MG immediate release tablet Commonly known as: Oxy IR/ROXICODONE   sildenafil 100 MG tablet Commonly known as: Viagra     TAKE these medications   Accu-Chek FastClix Lancets Misc 1 each by Does not apply route 4 (four) times daily. E11.9   Accu-Chek Guide Control Liqd 1 each by Other route as needed (Use to calibrate meter prn). E11.9   Accu-Chek Guide Me w/Device Kit 1 kit by Does not apply route See admin instructions. Check blood sugar 4 times a day   Accu-Chek Guide test strip Generic drug: glucose blood 1 each by Other route 4 (four) times daily.   B-D SINGLE USE SWABS REGULAR Pads Use to swab finger 4 times daily.   BD Veo Insulin Syringe U/F 31G X 15/64" 1 ML Misc Generic drug: Insulin Syringe-Needle U-100 Use for injecting insulin once a day.   betamethasone valerate ointment 0.1 % Commonly known as: VALISONE APPLY ONE APPLICATION TOPICALLY TWO TIMES DAILY   chlorproMAZINE 25 MG tablet Commonly known as: THORAZINE Take 1 tablet (25 mg total) by mouth 4 (four) times daily as needed for hiccoughs (not relieved with baclofen).   cholecalciferol 1000 units tablet Commonly known as: VITAMIN D Take 1 tablet (1,000 Units total) by mouth daily.   Cinnamon 500 MG capsule Take 500 mg by mouth 2 (two) times daily.   diphenhydrAMINE-zinc acetate cream Commonly known as: BENADRYL Apply topically 3 (three) times daily.   Fish Oil 1000 MG Caps Take 2 capsules by mouth daily.   Fluticasone-Salmeterol 100-50 MCG/DOSE Aepb Commonly known as: ADVAIR Inhale 1 puff into the lungs 2 (two) times daily.   furosemide 20 MG tablet Commonly known as: LASIX Take 1 tablet (20 mg total) by mouth daily. Start taking on: January 27, 2020   HOMEOPATHIC PRODUCTS PO Take by mouth daily. "Hyland's Leg Cramps"   HYDROmorphone 2 MG tablet Commonly  known as: Dilaudid Take 1 tablet (2 mg total) by mouth every 8 (eight) hours as needed for up to 5 days for severe pain.   hydrOXYzine 25 MG tablet Commonly known as: ATARAX/VISTARIL Take 1 tablet by mouth three times daily as needed What changed:   when to take this  reasons to take this   latanoprost 0.005 % ophthalmic solution Commonly known as: XALATAN Place 1 drop into both eyes daily.   magic mouthwash Soln Take 5-10 mLs by mouth 4 (four) times daily as needed for mouth pain (Swish and swallow).   methocarbamol 500 MG tablet Commonly known as: Robaxin Take 1 tablet (500 mg total) by mouth every 6 (six) hours as needed for muscle spasms. What changed:   when to take this  reasons to take this   multivitamin with minerals tablet Take 1 tablet by mouth daily.   NovoLIN 70/30 (70-30) 100 UNIT/ML injection Generic drug: insulin NPH-regular Human Inject 15 Units into the skin 2 (two) times daily with a meal. Please monitor your blood sugars very well at home while taking this insulin.  We have reduced the dose because your  oral intake is not that great What changed:   how much to take  when to take this  additional instructions   ondansetron 4 MG tablet Commonly known as: Zofran Take 1 tablet (4 mg total) by mouth every 8 (eight) hours as needed for nausea or vomiting.   pantoprazole 40 MG tablet Commonly known as: PROTONIX Take 1 tablet (40 mg total) by mouth daily. Start taking on: January 27, 2020 What changed:   how much to take  how to take this  when to take this  additional instructions   polyethylene glycol 17 g packet Commonly known as: MIRALAX / GLYCOLAX Take 17 g by mouth daily.   traZODone 50 MG tablet Commonly known as: DESYREL TAKE 1 TABLET AT BEDTIME AS NEEDED FOR SLEEP What changed:   reasons to take this  additional instructions   ursodiol 300 MG capsule Commonly known as: ACTIGALL Take 1 capsule (300 mg total) by mouth 2  (two) times daily.       Follow-up Information    Hoyt Koch, MD. Schedule an appointment as soon as possible for a visit in 1 week(s).   Specialty: Internal Medicine Contact information: [place] [id number] [phone]              Allergies  Allergen Reactions  . Crestor [Rosuvastatin] Other (See Comments)    INTOLERANCE   . Metformin And Related Other (See Comments)    Stomach ache  . Mobic [Meloxicam] Rash    Consultations:  Oncology   Procedures/Studies: CT ABDOMEN PELVIS WO CONTRAST  Result Date: 01/22/2020 CLINICAL DATA:  69 year old male with right lower quadrant abdominal pain. EXAM: CT ABDOMEN AND PELVIS WITHOUT CONTRAST TECHNIQUE: Multidetector CT imaging of the abdomen and pelvis was performed following the standard protocol without IV contrast. COMPARISON:  CT abdomen pelvis date 01/07/2020. FINDINGS: Evaluation of this exam is limited in the absence of intravenous contrast. Lower chest: The visualized lung bases are clear. There is coronary vascular calcification. No intra-abdominal free air. Trace perihepatic ascites. Hepatobiliary: Morphologic changes of cirrhosis. The liver is enlarged measuring approximately 19 cm in midclavicular length. No intrahepatic biliary dilatation. High attenuating content within gallbladder may represent sludge or vicarious excretion of intravenous contrast from recent administration. No pericholecystic fluid or evidence of acute cholecystitis by CT. Pancreas: Unremarkable. No pancreatic ductal dilatation or surrounding inflammatory changes. Spleen: Normal in size without focal abnormality. Adrenals/Urinary Tract: The adrenal glands unremarkable. The kidneys, visualized ureters, and urinary bladder appear unremarkable. Stomach/Bowel: Small hiatal hernia. There is moderate stool throughout the colon. There is no bowel obstruction or active inflammation. The appendix is normal. Vascular/Lymphatic:  Advanced aortoiliac atherosclerotic disease. The IVC is unremarkable. No portal venous gas. There is no adenopathy. Reproductive: Prostate and seminal vesicles are grossly unremarkable. No pelvic mass. Other: There is a 2 x 3 cm intramuscular lipoma along the right posterior flank. Musculoskeletal: Mild degenerative changes of the spine. No acute osseous pathology. Areas of sclerotic changes involving the right femoral head may represent early avascular necrosis. No cortical collapse or fragmentation. IMPRESSION: 1. No acute intra-abdominal or pelvic pathology. No bowel obstruction. Normal appendix. 2. Cirrhosis with trace perihepatic ascites. 3. Aortic Atherosclerosis (ICD10-I70.0). Electronically Signed   By: Anner Crete M.D.   On: 01/22/2020 20:35   DG Chest 2 View  Result Date: 01/09/2020 CLINICAL DATA:  69 year old male with history of shortness of breath. EXAM: CHEST - 2 VIEW COMPARISON:  Chest x-ray 01/07/2020. FINDINGS: Elevation of the right hemidiaphragm. Lung volumes are normal. No consolidative airspace disease. No pleural effusions. No pneumothorax. No pulmonary nodule or mass  noted. Pulmonary vasculature and the cardiomediastinal silhouette are within normal limits. IMPRESSION: 1. No radiographic evidence of acute cardiopulmonary disease. 2. Elevation of the right hemidiaphragm, similar to the prior study. Electronically Signed   By: Vinnie Langton M.D.   On: 01/09/2020 18:46   DG Pelvis 1-2 Views  Result Date: 01/08/2020 CLINICAL DATA:  Left hip pain after fall several days ago. EXAM: PELVIS - 1-2 VIEW COMPARISON:  CT abdomen pelvis from yesterday. FINDINGS: No acute fracture or dislocation. Unchanged mild right greater than left hip joint space narrowing with marginal osteophytes. Unchanged increased subchondral sclerosis in the right femoral head. The pubic symphysis and sacroiliac joints are unremarkable. Soft tissues are unremarkable. Extensive vascular calcifications. IMPRESSION:  1. No acute osseous abnormality. 2. 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 Electronically Signed   By: Titus Dubin M.D.   On: 01/08/2020 19:09   CT ANGIO CHEST PE W OR WO CONTRAST  Result Date: 01/09/2020 CLINICAL DATA:  Pulmonary embolism (PE), suspected Tachycardia and tachypnea. Patient has history of hepatocellular carcinoma. EXAM: CT ANGIOGRAPHY CHEST WITH CONTRAST TECHNIQUE: Multidetector CT imaging of the chest was performed using the standard protocol during bolus administration of intravenous contrast. Multiplanar CT image reconstructions and MIPs were obtained to evaluate the vascular anatomy. CONTRAST:  151m OMNIPAQUE IOHEXOL 350 MG/ML SOLN COMPARISON:  Radiograph earlier today.  Chest CT a 01/20/2019 FINDINGS: Cardiovascular: There are no filling defects within the pulmonary arteries to suggest pulmonary embolus. Evaluation is diagnostic to the segmental level. The subsegmental branches are not well assessed due to breathing motion artifact and contrast bolus timing. Aortic atherosclerosis without dissection or aneurysm. Heart is normal in size. There is a small pericardial effusion abutting the right heart border. Mediastinum/Nodes: Patulous esophagus with fluid level distally and scattered areas of wall thickening. There is a prominent paraesophageal node adjacent to the distal esophagus measuring 7 mm, series 4, image 64. No enlarged mediastinal or hilar lymph nodes. No suspicious thyroid nodule. Lungs/Pleura: Small right pleural effusion with adjacent compressive atelectasis. No airspace consolidation. No pulmonary mass. Ill-defined subpleural opacities in the right upper lobe are nonspecific. Upper Abdomen: Prominent size liver with nodular contours consistent with cirrhosis. No acute upper abdominal findings. Musculoskeletal: There is a lytic lesion involving the right posterior second rib at the costovertebral junction with extraosseous soft tissue component and bony  expansion, series 6, image 15. This is new from prior exam. Lucent lesion within the left lateral fifth rib, this is indeterminate and was present on prior exam, not significantly changed. Review of the MIP images confirms the above findings. IMPRESSION: 1. No pulmonary embolus. 2. Small right pleural effusion with adjacent compressive atelectasis. 3. Lytic lesion involving the right posterior second rib at the costovertebral junction with extraosseous soft tissue component and bony expansion, suspicious for metastatic disease. Lucent lesion within the left lateral fifth rib, also present on exam 1 year ago and not significantly changed, nonspecific. Bone scan could be considered for whole-body osseous evaluation. 4. Patulous esophagus with fluid level distally and scattered areas of wall thickening. Prominent paraesophageal node measuring 7 mm, nonspecific. Possibility of esophagitis or reflux is raised. 5. Cirrhosis. Aortic Atherosclerosis (ICD10-I70.0). These results will be called to the ordering clinician or representative by the Radiologist Assistant, and communication documented in the PACS or [employer] Electronically Signed   By: MKeith RakeM.D.   On: 01/09/2020 23:41   CT Abdomen Pelvis W Contrast  Result Date: 01/07/2020 CLINICAL DATA:  Abdominal pain, fever. EXAM:  CT ABDOMEN AND PELVIS WITH CONTRAST TECHNIQUE: Multidetector CT imaging of the abdomen and pelvis was performed using the standard protocol following bolus administration of intravenous contrast. CONTRAST:  169m OMNIPAQUE IOHEXOL 300 MG/ML  SOLN COMPARISON:  February 18, 2019.  October 27, 2019. FINDINGS: Lower chest: Minimal right pleural effusion is noted with adjacent subsegmental atelectasis. Hepatobiliary: No gallstones or biliary dilatation is noted. Nodular hepatic contours are noted concerning for cirrhosis. Multiple ill-defined low densities are noted throughout hepatic parenchyma consistent with the history of  hepatocellular carcinoma. Pancreas: Unremarkable. No pancreatic ductal dilatation or surrounding inflammatory changes. Spleen: Normal in size without focal abnormality. Adrenals/Urinary Tract: Adrenal glands are unremarkable. Kidneys are normal, without renal calculi, focal lesion, or hydronephrosis. Bladder is unremarkable. Stomach/Bowel: Stomach is within normal limits. Appendix appears normal. No evidence of bowel wall thickening, distention, or inflammatory changes. Vascular/Lymphatic: Aortic atherosclerosis. No enlarged abdominal or pelvic lymph nodes. Reproductive: Prostate is unremarkable. Other: No abdominal wall hernia or abnormality. No abdominopelvic ascites. Musculoskeletal: No acute or significant osseous findings. IMPRESSION: 1. Nodular hepatic contours are noted concerning for cirrhosis. Multiple ill-defined low densities are noted throughout hepatic parenchyma consistent with the history of hepatocellular carcinoma as noted on prior MRI. 2. Minimal right pleural effusion is noted with adjacent subsegmental atelectasis. 3. Aortic atherosclerosis. Aortic Atherosclerosis (ICD10-I70.0). Electronically Signed   By: JMarijo ConceptionM.D.   On: 01/07/2020 14:55   MR 3D Recon At Scanner  Result Date: 01/23/2020 CLINICAL DATA:  Jaundice. Hepatocellular carcinoma. Bilirubin equal 9.5. EXAM: MRI ABDOMEN WITHOUT AND WITH CONTRAST (INCLUDING MRCP) TECHNIQUE: Multiplanar multisequence MR imaging of the abdomen was performed both before and after the administration of intravenous contrast. Heavily T2-weighted images of the biliary and pancreatic ducts were obtained, and three-dimensional MRCP images were rendered by post processing. CONTRAST:  837mGADAVIST GADOBUTROL 1 MMOL/ML IV SOLN COMPARISON:  MRI 10/27/2019, CT 01/22/2020 FINDINGS: Lower chest:  Lung bases are clear. Hepatobiliary: Liver has expanded in volume significantly from MRI 10/27/2019. Liver measures 23 cm in craniocaudad dimension (image  32/series 30) compared to 16 mm cm on 10/27/2019. Liver measures 19 cm in axial dimension (anterior-posterior) through a dominant RIGHT hepatic lobe lesion compared to 15 cm on comparison MRI at same level. Again demonstrated multiple round lesions throughout the liver which are near confluent in nature. In total the masses have increased significantly in size now occupy the entirety of the liver parenchyma (T2 weighted imaging, series 6). One dominant larger round lesion in the RIGHT hepatic lobe is similar measuring 3.8 cm unchanged from prior. There is no intrahepatic biliary duct dilatation. Common bile duct is normal caliber. Pancreas: Normal pancreatic parenchymal intensity. No ductal dilatation or inflammation. Spleen: Normal spleen. Adrenals/urinary tract: Adrenal glands and kidneys are normal. Stomach/Bowel: Stomach and limited of the small bowel is unremarkable Vascular/Lymphatic: Abdominal aortic normal caliber. No retroperitoneal periportal lymphadenopathy. Musculoskeletal: No aggressive osseous lesion IMPRESSION: 1. Marked interval enlargement of the liver with diffuse infiltrative hepatocellular carcinoma. 2. Multiple round lesions now occupy the near entirety of the liver parenchyma consistent with progression of disease from 10/27/2019. 3. No intrahepatic or extrahepatic biliary duct dilatation. 4. Normal pancreas. These results will be called to the ordering clinician or representative by the Radiologist Assistant, and communication documented in the PACS or ClFront[employer] CorporationElectronically Signed   By: StSuzy Bouchard.D.   On: 01/23/2020 15:20   USKoreabdomen Limited  Result Date: 01/20/2020 CLINICAL DATA:  HCC, ascites search EXAM: LIMITED ABDOMEN ULTRASOUND FOR ASCITES TECHNIQUE:  Limited ultrasound survey for ascites was performed in all four abdominal quadrants. COMPARISON:  01/08/2020 FINDINGS: Four quadrant ascites search reveals no significant ascites. IMPRESSION: No significant ascites.  Electronically Signed   By: Eddie Candle M.D.   On: 01/20/2020 14:13   DG Chest Port 1 View  Result Date: 01/07/2020 CLINICAL DATA:  Sepsis. EXAM: PORTABLE CHEST 1 VIEW COMPARISON:  January 20, 2019. FINDINGS: The heart size and mediastinal contours are within normal limits. Both lungs are clear. The visualized skeletal structures are unremarkable. IMPRESSION: No active disease. Electronically Signed   By: Marijo Conception M.D.   On: 01/07/2020 13:31   MR ABDOMEN MRCP W WO CONTAST  Result Date: 01/23/2020 CLINICAL DATA:  Jaundice. Hepatocellular carcinoma. Bilirubin equal 9.5. EXAM: MRI ABDOMEN WITHOUT AND WITH CONTRAST (INCLUDING MRCP) TECHNIQUE: Multiplanar multisequence MR imaging of the abdomen was performed both before and after the administration of intravenous contrast. Heavily T2-weighted images of the biliary and pancreatic ducts were obtained, and three-dimensional MRCP images were rendered by post processing. CONTRAST:  32m GADAVIST GADOBUTROL 1 MMOL/ML IV SOLN COMPARISON:  MRI 10/27/2019, CT 01/22/2020 FINDINGS: Lower chest:  Lung bases are clear. Hepatobiliary: Liver has expanded in volume significantly from MRI 10/27/2019. Liver measures 23 cm in craniocaudad dimension (image 32/series 30) compared to 16 mm cm on 10/27/2019. Liver measures 19 cm in axial dimension (anterior-posterior) through a dominant RIGHT hepatic lobe lesion compared to 15 cm on comparison MRI at same level. Again demonstrated multiple round lesions throughout the liver which are near confluent in nature. In total the masses have increased significantly in size now occupy the entirety of the liver parenchyma (T2 weighted imaging, series 6). One dominant larger round lesion in the RIGHT hepatic lobe is similar measuring 3.8 cm unchanged from prior. There is no intrahepatic biliary duct dilatation. Common bile duct is normal caliber. Pancreas: Normal pancreatic parenchymal intensity. No ductal dilatation or inflammation.  Spleen: Normal spleen. Adrenals/urinary tract: Adrenal glands and kidneys are normal. Stomach/Bowel: Stomach and limited of the small bowel is unremarkable Vascular/Lymphatic: Abdominal aortic normal caliber. No retroperitoneal periportal lymphadenopathy. Musculoskeletal: No aggressive osseous lesion IMPRESSION: 1. Marked interval enlargement of the liver with diffuse infiltrative hepatocellular carcinoma. 2. Multiple round lesions now occupy the near entirety of the liver parenchyma consistent with progression of disease from 10/27/2019. 3. No intrahepatic or extrahepatic biliary duct dilatation. 4. Normal pancreas. These results will be called to the ordering clinician or representative by the Radiologist Assistant, and communication documented in the PACS or [employer] Electronically Signed   By: SSuzy BouchardM.D.   On: 01/23/2020 15:20   DG FEMUR MIN 2 VIEWS LEFT  Result Date: 01/08/2020 CLINICAL DATA:  Left hip pain following fall several days ago, initial encounter EXAM: LEFT FEMUR 2 VIEWS COMPARISON:  None. FINDINGS: Degenerative changes of left hip joint are noted. No acute fracture or dislocation is noted. Heavy atherosclerotic calcifications are noted. 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 No soft tissue abnormality is noted. IMPRESSION: No acute abnormality noted.  Degenerative changes are seen. Electronically Signed   By: MInez CatalinaM.D.   On: 01/08/2020 19:03   VAS UKoreaLOWER EXTREMITY VENOUS (DVT)  Result Date: 01/10/2020  Lower Venous DVT Study Indications: Elevated d-dimer=6.55. Rhabdomyolysis. Hepatocellular carcinoma.  Limitations: Poor ultrasound/tissue interface. Comparison Study: No prior study Performing Technologist: MMaudry MayhewMHA, RDMS, RVT, RDCS  Examination Guidelines: A complete evaluation includes B-mode imaging, spectral Doppler, color Doppler, and power Doppler as needed of all accessible portions  of each vessel. Bilateral testing is considered an integral  part of a complete examination. Limited examinations for reoccurring indications may be performed as noted. The reflux portion of the exam is performed with the patient in reverse Trendelenburg.  +---------+---------------+---------+-----------+----------+--------------+ RIGHT    CompressibilityPhasicitySpontaneityPropertiesThrombus Aging +---------+---------------+---------+-----------+----------+--------------+ CFV      Full           Yes      Yes                                 +---------+---------------+---------+-----------+----------+--------------+ SFJ      Full                                                        +---------+---------------+---------+-----------+----------+--------------+ FV Prox  Full                                                        +---------+---------------+---------+-----------+----------+--------------+ FV Mid   Full                                                        +---------+---------------+---------+-----------+----------+--------------+ FV DistalFull                                                        +---------+---------------+---------+-----------+----------+--------------+ PFV      Full                                                        +---------+---------------+---------+-----------+----------+--------------+ POP      Full           Yes      Yes                                 +---------+---------------+---------+-----------+----------+--------------+ PTV      Full                                                        +---------+---------------+---------+-----------+----------+--------------+ PERO     Full                                                        +---------+---------------+---------+-----------+----------+--------------+   +---------+---------------+---------+-----------+----------+--------------+ LEFT  CompressibilityPhasicitySpontaneityPropertiesThrombus Aging  +---------+---------------+---------+-----------+----------+--------------+ CFV      Full           Yes      Yes                                 +---------+---------------+---------+-----------+----------+--------------+ SFJ      Full                                                        +---------+---------------+---------+-----------+----------+--------------+ FV Prox  Full                                                        +---------+---------------+---------+-----------+----------+--------------+ FV Mid   Full                                                        +---------+---------------+---------+-----------+----------+--------------+ FV DistalFull                                                        +---------+---------------+---------+-----------+----------+--------------+ PFV      Full                                                        +---------+---------------+---------+-----------+----------+--------------+ POP      Full           Yes      Yes                                 +---------+---------------+---------+-----------+----------+--------------+ PTV      Full                                                        +---------+---------------+---------+-----------+----------+--------------+ PERO     Full                                                        +---------+---------------+---------+-----------+----------+--------------+     Summary: RIGHT: - There is no evidence of deep vein thrombosis in the lower extremity.  - No cystic structure found in the popliteal fossa.  LEFT: - There is no evidence of deep vein thrombosis in the lower extremity.  - No cystic structure found in the  popliteal fossa.  *See table(s) above for measurements and observations. Electronically signed by Servando Snare MD on 01/10/2020 at 8:34:30 PM.    Final    US ABDOMEN LIMITED RUQ (LIVER/GB)  Result Date: 01/08/2020 CLINICAL DATA:  Right upper  quadrant pain EXAM: ULTRASOUND ABDOMEN LIMITED RIGHT UPPER QUADRANT COMPARISON:  Abdomen and pelvis CT from yesterday FINDINGS: Gallbladder: No gallstones or wall thickening visualized. No sonographic Murphy sign noted by sonographer. Common bile duct: Diameter: 3 mm Liver: Cirrhotic liver morphology including capsular nodularity. There is diffuse heterogeneity from hepatocellular disease and masses by prior CT and MRI. Portal vein is patent on color Doppler imaging with normal direction of blood flow towards the liver. IMPRESSION: 1. Negative gallbladder. 2. Known cirrhosis and hepatocellular carcinoma. Electronically Signed   By: Monte Fantasia M.D.   On: 01/08/2020 11:01      Subjective: Patient seen and examined at the bedside this morning.  Stable for discharge today to home with hospice  Discharge Exam: Vitals:   01/25/20 2016 01/26/20 0459  BP: (!) 161/80 139/72  Pulse: 90 85  Resp: 18 16  Temp: 98.2 F (36.8 C) 98.1 F (36.7 C)  SpO2: 100% 97%   Vitals:   01/25/20 1513 01/25/20 1927 01/25/20 2016 01/26/20 0459  BP: 138/66  (!) 161/80 139/72  Pulse: 96  90 85  Resp: '16  18 16  ' Temp: (!) 97.4 F (36.3 C)  98.2 F (36.8 C) 98.1 F (36.7 C)  TempSrc: Oral  Oral Oral  SpO2: 98% 96% 100% 97%  Weight:      Height:        General: Pt is alert, awake, not in acute distress Cardiovascular: RRR, S1/S2 +, no rubs, no gallops Respiratory: CTA bilaterally, no wheezing, no rhonchi Abdominal: Distended, soft and nontender Extremities: no edema, no cyanosis    The results of significant diagnostics from this hospitalization (including imaging, microbiology, ancillary and laboratory) are listed below for reference.     Microbiology: Recent Results (from the past 240 hour(s))  SARS Coronavirus 2 (TAT 6-24 hrs)     Status: None   Collection Time: 01/22/20  2:22 PM   Specimen: Nasopharyngeal Swab  Result Value Ref Range Status   SARS Coronavirus 2 NEGATIVE NEGATIVE Final     Comment: (NOTE) SARS-CoV-2 target nucleic acids are NOT DETECTED.  The SARS-CoV-2 RNA is generally detectable in upper and lower respiratory specimens during the acute phase of infection. Negative results do not preclude SARS-CoV-2 infection, do not rule out co-infections with other pathogens, and should not be used as the sole basis for treatment or other patient management decisions. Negative results must be combined with clinical observations, patient history, and epidemiological information. The expected result is Negative.  Fact Sheet for Patients: SugarRoll.be  Fact Sheet for Healthcare Providers: https://www.woods-mathews.com/  This test is not yet approved or cleared by the [place] FDA and  has been authorized for detection and/or diagnosis of SARS-CoV-2 by FDA under an Emergency Use Authorization (EUA). This EUA will remain  in effect (meaning this test can be used) for the duration of the COVID-19 declaration under Se ction 564(b)(1) of the Act, 21 U.S.C. section 360bbb-3(b)(1), unless the authorization is terminated or revoked sooner.  Performed at [facility], [place] [address] [place], [place] [address]      Labs: BNP (last 3 results) Recent Labs    01/09/20 0558  BNP 12.8   Basic Metabolic Panel: Recent Labs  Lab 01/22/20 0921 01/22/20 1825  01/23/20 0653 01/24/20 0623 01/25/20 0547 01/26/20 0601  NA 127*  --  126* 127* 126* 126*  K 4.9  --  4.4 4.7 4.7 4.6  CL 96*  --  94* 96* 96* 97*  CO2 21*  --  22 21* 19* 19*  GLUCOSE 128*  --  189* 97 87 93  BUN 30*  --  29* '13 12 17  ' CREATININE 1.49* 1.33* 1.00 0.68 0.68 0.70  CALCIUM 9.0  --  8.1* 8.0* 8.2* 7.9*   Liver Function Tests: Recent Labs  Lab 01/22/20 0921 01/23/20 0653 01/24/20 0623 01/25/20 0547 01/26/20 0601  AST 854* 576* 454* 447* 449*  ALT 453* 312* 259* 240* 202*  ALKPHOS 1,078* 871* 821* 808* 726*  BILITOT 10.1* 9.5* 11.1* 13.6*  13.7*  PROT 7.6 6.5 6.7 6.9 6.7  ALBUMIN 2.2* 2.3* 2.2* 2.3* 2.1*   No results for input(s): LIPASE, AMYLASE in the last 168 hours. No results for input(s): AMMONIA in the last 168 hours. CBC: Recent Labs  Lab 01/20/20 0903 01/22/20 1825 01/23/20 0653  WBC 11.0* 10.6* 9.7  NEUTROABS 8.4*  --   --   HGB 13.0 11.9* 11.6*  HCT 39.4 36.9* 36.3*  MCV 79.8* 81.8 82.5  PLT 807* 664* 688*   Cardiac Enzymes: No results for input(s): CKTOTAL, CKMB, CKMBINDEX, TROPONINI in the last 168 hours. BNP: Invalid input(s): POCBNP CBG: Recent Labs  Lab 01/25/20 1221 01/25/20 1755 01/25/20 2129 01/26/20 0817 01/26/20 1210  GLUCAP 78 88 93 125* 128*   D-Dimer No results for input(s): DDIMER in the last 72 hours. Hgb A1c No results for input(s): HGBA1C in the last 72 hours. Lipid Profile No results for input(s): CHOL, HDL, LDLCALC, TRIG, CHOLHDL, LDLDIRECT in the last 72 hours. Thyroid function studies No results for input(s): TSH, T4TOTAL, T3FREE, THYROIDAB in the last 72 hours.  Invalid input(s): FREET3 Anemia work up No results for input(s): VITAMINB12, FOLATE, FERRITIN, TIBC, IRON, RETICCTPCT in the last 72 hours. Urinalysis    Component Value Date/Time   COLORURINE YELLOW 01/07/2020 1530   APPEARANCEUR CLEAR 01/07/2020 1530   LABSPEC 1.027 01/07/2020 1530   PHURINE 6.0 01/07/2020 1530   GLUCOSEU NEGATIVE 01/07/2020 1530   HGBUR NEGATIVE 01/07/2020 1530   BILIRUBINUR NEGATIVE 01/07/2020 1530   KETONESUR NEGATIVE 01/07/2020 1530   PROTEINUR NEGATIVE 01/07/2020 1530   UROBILINOGEN 1.0 01/12/2014 0911   NITRITE NEGATIVE 01/07/2020 1530   LEUKOCYTESUR NEGATIVE 01/07/2020 1530   Sepsis Labs Invalid input(s): PROCALCITONIN,  WBC,  LACTICIDVEN Microbiology Recent Results (from the past 240 hour(s))  SARS Coronavirus 2 (TAT 6-24 hrs)     Status: None   Collection Time: 01/22/20  2:22 PM   Specimen: Nasopharyngeal Swab  Result Value Ref Range Status   SARS Coronavirus 2  NEGATIVE NEGATIVE Final    Comment: (NOTE) SARS-CoV-2 target nucleic acids are NOT DETECTED.  The SARS-CoV-2 RNA is generally detectable in upper and lower respiratory specimens during the acute phase of infection. Negative results do not preclude SARS-CoV-2 infection, do not rule out co-infections with other pathogens, and should not be used as the sole basis for treatment or other patient management decisions. Negative results must be combined with clinical observations, patient history, and epidemiological information. The expected result is Negative.  Fact Sheet for Patients: SugarRoll.be  Fact Sheet for Healthcare Providers: https://www.woods-mathews.com/  This test is not yet approved or cleared by the [place] FDA and  has been authorized for detection and/or diagnosis of SARS-CoV-2 by FDA under an  Emergency Use Authorization (EUA). This EUA will remain  in effect (meaning this test can be used) for the duration of the COVID-19 declaration under Se ction 564(b)(1) of the Act, 21 U.S.C. section 360bbb-3(b)(1), unless the authorization is terminated or revoked sooner.  Performed at [facility], [place] [address] [place], [place] [address]     Please note: You were cared for by a hospitalist during your hospital stay. Once you are discharged, your primary care physician will handle any further medical issues. Please note that NO REFILLS for any discharge medications will be authorized once you are discharged, as it is imperative that you return to your primary care physician (or establish a relationship with a primary care physician if you do not have one) for your post hospital discharge needs so that they can reassess your need for medications and monitor your lab values.    Time coordinating discharge: 40 minutes  SIGNED:   Shelly Coss, MD  [facility] 01/26/2020, 1:23 PM Pager [phone]  If 7PM-7AM,  please contact night-coverage [email or web address] Password [email or web address]

## 2020-01-26 NOTE — Progress Notes (Signed)
[name] was seen for COVID-19 testing prior to an admission to [facility] for pain control and hyperbilirubinemia.  Sandi Mealy, MHS, PA-C Physician Assistant

## 2020-01-27 ENCOUNTER — Other Ambulatory Visit: Payer: Self-pay

## 2020-01-27 ENCOUNTER — Inpatient Hospital Stay: Payer: Medicare HMO

## 2020-01-27 ENCOUNTER — Inpatient Hospital Stay: Payer: Medicare HMO | Admitting: Nurse Practitioner

## 2020-01-27 NOTE — Patient Instructions (Signed)
Goals Addressed            This Visit's Progress   . COMPLETED: THN-Follow My Treatment Plan       Follow Up Date 03/03/20  - keep a list of all the medicines I take; vitamins and herbals too - keep follow-up appointments    Why is this important?   Following your treatment plan will help keep your care on track.  Medicine may be the most important piece of your plan.  There are many reasons why you might want to stop taking medicine. You may get tired of taking your medicine. You may think medicine costs too much money. You may find the side effects are too much to bear.  Try some of these steps to make following the treatment plan a little easier.     Notes:     . COMPLETED: THN-Make and Keep All Appointments       Follow Up Date 03/03/20   - ask family or friend for a ride - keep a calendar with appointment dates    Why is this important?   Part of staying healthy is seeing the doctor for follow-up care.  If you forget your appointments, there are some things you can do to stay on track.    Notes:     . COMPLETED: THN-Manage Fatigue (Tiredness)       Follow Up Date 03/03/20   - eat healthy - maintain healthy weight - use meditation or relaxation techniques    Why is this important?   Cancer treatment and its side effects can drain your energy. It can keep you from doing things you would like to do.  There are many things that you can do to manage fatigue.    Notes:

## 2020-01-27 NOTE — Patient Outreach (Signed)
[facility]) Care Management  01/27/2020  [name] 1950/10/30 [id number]   Telephone call to patient. He reports he is home now and that his brother is his primary caregiver.  He states hospice nurse to come today and that he is waiting on the hospital bed.  Discussed diagnosis and prognosis with patient and that CM would no longer be calling but advised that he/family could call if he had questions or concerns. He verbalized understanding.    Plan: RN CM will close case and send notification to physician.    [name], RN, MSN [place] Management Care Management Coordinator Direct Line [phone] Cell [phone] Toll Free: [phone]  Fax: [phone]

## 2020-01-27 NOTE — Telephone Encounter (Signed)
Fine

## 2020-02-01 NOTE — Telephone Encounter (Signed)
Verbals left on [employer].

## 2020-02-02 ENCOUNTER — Inpatient Hospital Stay: Payer: Medicare HMO | Admitting: Nurse Practitioner

## 2020-02-02 ENCOUNTER — Encounter: Payer: Self-pay | Admitting: *Deleted

## 2020-02-02 ENCOUNTER — Ambulatory Visit: Payer: Self-pay

## 2020-02-02 NOTE — Progress Notes (Signed)
Faxed notification from Hospice that patient died on Feb 03, 2020. MD notified.

## 2020-02-03 DEATH — deceased

## 2020-02-04 ENCOUNTER — Other Ambulatory Visit: Payer: Self-pay

## 2020-02-04 NOTE — Patient Outreach (Signed)
[name]) Care Management  02/04/2020  [name] 28-Jun-1950 [id number]   Date of Review: 02/04/20 Reason:  Readmission  PCP: Dr. Pricilla Holm Insurance: [facility]  Medical Info 69 year old male with history of hepatocellular cancer status post chemotherapy,  followed by Dr. Benay Spice oncology as outpatient, alcoholism, hep C status post Harvoni, IDDM-2, PAD, COPD, HTN.  Admissions: Patient presented to ED on 01/07/20 after falling out of bed this morning and could not get up.  Patient also had complaint of right flank pain and abdominal pain.  He was found to have fever with diagnosis of Sepsis from a UTI.  Patient found to have progression of his liver cancer during his hospitalization.  Patient was discharged home on 01/12/20.  No home health needs.     Patient admitted again from the oncology office on 01/22/20 after increasing abdominal pain complaint.  Patient was found to have increased liver enzymes.  MRI showed severe progression of his hepatocellular carcinoma.  Hospice recommended.  Patient discharged home on 01/26/20 with hospice care.     Disposition: Patient home with hospice and support of family.  RN CM closed case.    [name], RN, MSN [place] Management Care Management Coordinator Direct Line [phone] Cell [phone] Toll Free: [phone]  Fax: [phone]

## 2020-02-11 ENCOUNTER — Ambulatory Visit: Payer: Self-pay

## 2020-02-24 ENCOUNTER — Telehealth: Payer: Self-pay | Admitting: Internal Medicine

## 2020-02-24 NOTE — Progress Notes (Signed)
  Chronic Care Management   Outreach Note  02/24/2020 Name: [name] MRN: [id number] DOB: 1951-01-21  Referred by: Hoyt Koch, MD Reason for referral : No chief complaint on file.   An unsuccessful telephone outreach was attempted today. The patient was referred to the pharmacist for assistance with care management and care coordination.   Follow Up Plan:   Carley Perdue [employer] Scheduler

## 2020-06-02 ENCOUNTER — Other Ambulatory Visit (HOSPITAL_COMMUNITY): Payer: Self-pay
